# Patient Record
Sex: Female | Born: 1950
Health system: Southern US, Community
[De-identification: ages and names within clinical notes are randomized; demographics above are authoritative.]

## PROBLEM LIST (undated history)

## (undated) DIAGNOSIS — R0902 Hypoxemia: Secondary | ICD-10-CM

## (undated) DIAGNOSIS — K5900 Constipation, unspecified: Secondary | ICD-10-CM

## (undated) DIAGNOSIS — Z8709 Personal history of other diseases of the respiratory system: Secondary | ICD-10-CM

## (undated) DIAGNOSIS — K5790 Diverticulosis of intestine, part unspecified, without perforation or abscess without bleeding: Secondary | ICD-10-CM

## (undated) DIAGNOSIS — G43909 Migraine, unspecified, not intractable, without status migrainosus: Secondary | ICD-10-CM

## (undated) DIAGNOSIS — J309 Allergic rhinitis, unspecified: Secondary | ICD-10-CM

## (undated) DIAGNOSIS — E039 Hypothyroidism, unspecified: Secondary | ICD-10-CM

## (undated) DIAGNOSIS — G4700 Insomnia, unspecified: Secondary | ICD-10-CM

## (undated) DIAGNOSIS — R350 Frequency of micturition: Secondary | ICD-10-CM

## (undated) DIAGNOSIS — I10 Essential (primary) hypertension: Secondary | ICD-10-CM

## (undated) DIAGNOSIS — Z9981 Dependence on supplemental oxygen: Secondary | ICD-10-CM

## (undated) DIAGNOSIS — M2540 Effusion, unspecified joint: Secondary | ICD-10-CM

## (undated) DIAGNOSIS — Z9889 Other specified postprocedural states: Secondary | ICD-10-CM

## (undated) DIAGNOSIS — M1990 Unspecified osteoarthritis, unspecified site: Secondary | ICD-10-CM

## (undated) DIAGNOSIS — J189 Pneumonia, unspecified organism: Secondary | ICD-10-CM

## (undated) DIAGNOSIS — T4145XA Adverse effect of unspecified anesthetic, initial encounter: Secondary | ICD-10-CM

## (undated) DIAGNOSIS — B019 Varicella without complication: Secondary | ICD-10-CM

## (undated) DIAGNOSIS — K219 Gastro-esophageal reflux disease without esophagitis: Secondary | ICD-10-CM

## (undated) DIAGNOSIS — K649 Unspecified hemorrhoids: Secondary | ICD-10-CM

## (undated) DIAGNOSIS — Z860100 Personal history of colon polyps, unspecified: Secondary | ICD-10-CM

## (undated) DIAGNOSIS — J40 Bronchitis, not specified as acute or chronic: Secondary | ICD-10-CM

## (undated) DIAGNOSIS — Z8719 Personal history of other diseases of the digestive system: Secondary | ICD-10-CM

## (undated) DIAGNOSIS — T8859XA Other complications of anesthesia, initial encounter: Secondary | ICD-10-CM

## (undated) DIAGNOSIS — E876 Hypokalemia: Secondary | ICD-10-CM

## (undated) DIAGNOSIS — F419 Anxiety disorder, unspecified: Secondary | ICD-10-CM

## (undated) DIAGNOSIS — R0602 Shortness of breath: Secondary | ICD-10-CM

## (undated) DIAGNOSIS — F32A Depression, unspecified: Secondary | ICD-10-CM

## (undated) DIAGNOSIS — E785 Hyperlipidemia, unspecified: Secondary | ICD-10-CM

## (undated) DIAGNOSIS — E119 Type 2 diabetes mellitus without complications: Secondary | ICD-10-CM

## (undated) DIAGNOSIS — Z87442 Personal history of urinary calculi: Secondary | ICD-10-CM

## (undated) DIAGNOSIS — R112 Nausea with vomiting, unspecified: Secondary | ICD-10-CM

## (undated) DIAGNOSIS — R1310 Dysphagia, unspecified: Secondary | ICD-10-CM

## (undated) DIAGNOSIS — F329 Major depressive disorder, single episode, unspecified: Secondary | ICD-10-CM

## (undated) HISTORY — DX: Depression, unspecified: F32.A

## (undated) HISTORY — PX: ESOPHAGOGASTRODUODENOSCOPY: SHX1529

## (undated) HISTORY — DX: Other complications of anesthesia, initial encounter: T88.59XA

## (undated) HISTORY — DX: Type 2 diabetes mellitus without complications: E11.9

## (undated) HISTORY — DX: Allergic rhinitis, unspecified: J30.9

## (undated) HISTORY — DX: Major depressive disorder, single episode, unspecified: F32.9

## (undated) HISTORY — PX: ABDOMINAL EXPLORATION SURGERY: SHX538

## (undated) HISTORY — DX: Hypothyroidism, unspecified: E03.9

## (undated) HISTORY — PX: CHOLECYSTECTOMY: SHX55

## (undated) HISTORY — DX: Anxiety disorder, unspecified: F41.9

## (undated) HISTORY — DX: Unspecified osteoarthritis, unspecified site: M19.90

## (undated) HISTORY — PX: OTHER SURGICAL HISTORY: SHX169

## (undated) HISTORY — PX: LITHOTRIPSY: SUR834

## (undated) HISTORY — DX: Hypoxemia: R09.02

## (undated) HISTORY — DX: Dependence on supplemental oxygen: Z99.81

## (undated) HISTORY — DX: Adverse effect of unspecified anesthetic, initial encounter: T41.45XA

## (undated) HISTORY — DX: Gastro-esophageal reflux disease without esophagitis: K21.9

## (undated) HISTORY — DX: Hypokalemia: E87.6

## (undated) HISTORY — DX: Migraine, unspecified, not intractable, without status migrainosus: G43.909

## (undated) HISTORY — DX: Hyperlipidemia, unspecified: E78.5

## (undated) HISTORY — DX: Varicella without complication: B01.9

## (undated) HISTORY — PX: APPENDECTOMY: SHX54

## (undated) HISTORY — DX: Bronchitis, not specified as acute or chronic: J40

## (undated) HISTORY — PX: TUBAL LIGATION: SHX77

---

## 2004-09-03 ENCOUNTER — Ambulatory Visit: Payer: Self-pay | Admitting: General Practice

## 2005-09-15 ENCOUNTER — Other Ambulatory Visit: Payer: Self-pay

## 2005-09-16 ENCOUNTER — Ambulatory Visit: Payer: Self-pay | Admitting: Unknown Physician Specialty

## 2006-04-14 ENCOUNTER — Ambulatory Visit: Payer: Self-pay | Admitting: Family Medicine

## 2008-04-04 ENCOUNTER — Ambulatory Visit: Payer: Self-pay | Admitting: Family Medicine

## 2010-02-19 ENCOUNTER — Ambulatory Visit: Payer: Self-pay

## 2011-02-26 ENCOUNTER — Encounter: Payer: Self-pay | Admitting: Cardiothoracic Surgery

## 2011-02-26 ENCOUNTER — Encounter: Payer: Self-pay | Admitting: Nurse Practitioner

## 2011-07-29 ENCOUNTER — Ambulatory Visit: Payer: Self-pay | Admitting: Family Medicine

## 2012-08-21 ENCOUNTER — Ambulatory Visit: Payer: Self-pay

## 2012-10-18 ENCOUNTER — Encounter: Payer: Self-pay | Admitting: Pulmonary Disease

## 2012-10-19 ENCOUNTER — Ambulatory Visit (INDEPENDENT_AMBULATORY_CARE_PROVIDER_SITE_OTHER): Payer: BC Managed Care – HMO | Admitting: Pulmonary Disease

## 2012-10-19 ENCOUNTER — Encounter: Payer: Self-pay | Admitting: Pulmonary Disease

## 2012-10-19 VITALS — BP 142/80 | HR 74 | Temp 97.9°F | Ht 67.0 in | Wt 218.0 lb

## 2012-10-19 DIAGNOSIS — J84112 Idiopathic pulmonary fibrosis: Secondary | ICD-10-CM | POA: Insufficient documentation

## 2012-10-19 DIAGNOSIS — R0602 Shortness of breath: Secondary | ICD-10-CM

## 2012-10-19 DIAGNOSIS — R05 Cough: Secondary | ICD-10-CM

## 2012-10-19 NOTE — Patient Instructions (Signed)
Keep using the Advair, singulair, claritin, and flonase as you are doing Use [facility] rinses with distilled water at least twice per day using the instructions on the package. 1/2 hour after using the [facility] rinse, use Flonase two puffs in each nostril once per day. (It is OK for you to decrease the Flonase to one puff in each nostril every day if your nose is sore.) Use phenylephrine (a decongestant, ask the pharmacist if you need help finding it) as needed for the sinus congestion and cough.   We will send you for lung function testing at [facility].  We will see you back in 2-3 weeks.

## 2012-10-19 NOTE — Progress Notes (Signed)
Subjective:    Patient ID: Kaitlin Hamilton, female    DOB: 04-Oct-1950, 62 y.o.   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 Showed normal childhood without respiratory illnesses and never smoked cigarettes. Since 2006 she has had several episodes of bronchitis which of them quite lengthy. These typically last anywhere from 3 weeks to several months and are associated with cough, wheeze, chest tightness, and shortness of breath. Most recently, in January 2014 she developed a cough with sinus congestion and some phlegm production. She was seen by her primary care doctor on 2 separate occasions who treated her with antibiotics and ordered a chest x-ray. The chest x-ray was read as normal. Her primary care physician thought she might have asthma so she started her on Advair as well as Singulair and Claritin and Flonase. In the last 2-3 weeks her symptoms have improved significantly but she still has some residual cough which is nonproductive. This is associated with sinus congestion and a postnasal drip. She typically produces a significant amount of nasal mucus in the mornings. She uses saline rinses on a regular basis but has cut back the dose of her Flonase recently because of soreness in her nose. She does not have fevers chills or chest pain. She states in the past she had some chest pain and had a cardiac workup which was negative. She was told that her chest pain was due to anxiety.   Past Medical History  Diagnosis Date  . Asthma   . Bronchitis   . Chicken pox   . Depression   . Migraines   . OA (osteoarthritis)   . Hypothyroidism (acquired)   . Allergic rhinitis   . Non-insulin dependent type 2 diabetes mellitus   . Hyperlipidemia   . Hypokalemia      Family History  Problem Relation Age of Onset  . Rheum arthritis Mother   . Rheum arthritis Sister   . Prostate cancer  Brother   . Heart disease Maternal Grandmother   . Heart disease Maternal Grandfather   . Uterine cancer Daughter      History   Social History  . Marital Status: Unknown    Spouse Name: N/A    Number of Children: 2  . Years of Education: N/A   Occupational History  . Day Care at [employer]    Social History Main Topics  . Smoking status: Never Smoker   . Smokeless tobacco: Never Used  . Alcohol Use: No  . Drug Use: No  . Sexually Active: Not on file   Other Topics Concern  . Not on file   Social History Narrative  . No narrative on file     Allergies  Allergen Reactions  . Codeine Hives and Swelling  . Demerol (Meperidine) Hives and Swelling     No outpatient prescriptions prior to visit.   No facility-administered medications prior to visit.       Review of Systems  Constitutional: Negative for fever, chills and unexpected weight change.  HENT: Positive for postnasal drip. Negative for ear pain, nosebleeds, congestion, sore throat, rhinorrhea, sneezing, trouble swallowing, dental problem, voice change and sinus pressure.   Eyes: Negative for visual disturbance.  Respiratory: Positive for cough and shortness of breath. Negative for choking.   Cardiovascular: Negative for chest pain and leg swelling.  Gastrointestinal: Negative for vomiting, abdominal pain and diarrhea.  Genitourinary: Negative for difficulty urinating.  Musculoskeletal: Negative for arthralgias.  Skin: Negative for rash.  Neurological: Negative for tremors, syncope and headaches.  Hematological: Does not bruise/bleed easily.       Objective:   Physical Exam  Filed Vitals:   10/19/12 0920  BP: 142/80  Pulse: 74  Temp: 97.9 F (36.6 C)  TempSrc: Oral  Height: 5\' 7"  (1.702 m)  Weight: 218 lb (98.884 kg)  SpO2: 97%   Gen: obese, well appearing, no acute distress HEENT: NCAT, PERRL, EOMi, OP clear, neck supple without masses PULM: Few insp crackles in bases bilaterally CV: RRR, no  mgr, no JVD AB: BS+, soft, nontender, no hsm Ext: warm, no edema, no clubbing, no cyanosis Derm: no rash or skin breakdown Neuro: A&Ox4, CN II-XII intact, strength 5/5 in all 4 extremities  3 2013 chest x-ray [facility] normal     Assessment & Plan:   Shortness of breath I explained to Mrs. Cravey today that even though her simple spirometry was normal I believe that adult onset asthma it is possible. She describes prolonged episodes of bronchitis on multiple occasions in the last several years. Recently her symptoms have improved with the addition of an inhaled corticosteroid as well as intermittent albuterol. Given the fact that she has what sounds like fairly significant allergies (allergic rhinitis in the spring) is certainly possible that she has asthma. She tells me that she has had a cardiac evaluation in the last several years which was normal and on physical exam today she does not appear to be volume overloaded. I am encouraged by the fact that her chest x-ray is normal.  So, given her response to Advair, recent prednisone, and albuterol it is reasonable to assume that her dyspnea is do to asthma. To help sort this out I will send her for full pulmonary function testing as well as pre-and post bronchodilator testing. If this shows a significant response to a bronchodilator and I think we can label this asthma.  Plan: -Full pulmonary function testing with pre-and post bronchodilator test -Continue Advair, Singulair, Claritin, and albuterol -Will attempt to decrease inhaled controller medications over the course of the next few months   Updated Medication List Outpatient Encounter Prescriptions as of 10/19/2012  Medication Sig Dispense Refill  . albuterol (VENTOLIN HFA) 108 (90 BASE) MCG/ACT inhaler Inhale 2 puffs into the lungs every 6 (six) hours as needed.      . fluticasone (FLONASE) 50 MCG/ACT nasal spray Place 1 spray into the nose 2 (two) times daily.      . Fluticasone-Salmeterol  (ADVAIR DISKUS) 250-50 MCG/DOSE AEPB Inhale 1 puff into the lungs every 12 (twelve) hours.      Marland Kitchen glipiZIDE (GLUCOTROL) 5 MG tablet Take 5 mg by mouth daily.      . hydrochlorothiazide (MICROZIDE) 12.5 MG capsule Take 12.5 mg by mouth daily.      Marland Kitchen levothyroxine (SYNTHROID, LEVOTHROID) 150 MCG tablet Take 150 mcg by mouth daily.      . montelukast (SINGULAIR) 10 MG tablet Take 10 mg by mouth at bedtime.      . ranitidine (ZANTAC) 75 MG tablet Take 75 mg by mouth daily.      . verapamil (VERELAN PM) 180 MG 24 hr capsule Take 180 mg by mouth daily.       No facility-administered encounter medications on file as of 10/19/2012.

## 2012-10-19 NOTE — Assessment & Plan Note (Signed)
I explained to Mrs. Kingbird today that even though her simple spirometry was normal I believe that adult onset asthma it is possible. She describes prolonged episodes of bronchitis on multiple occasions in the last several years. Recently her symptoms have improved with the addition of an inhaled corticosteroid as well as intermittent albuterol. Given the fact that she has what sounds like fairly significant allergies (allergic rhinitis in the spring) is certainly possible that she has asthma. She tells me that she has had a cardiac evaluation in the last several years which was normal and on physical exam today she does not appear to be volume overloaded. I am encouraged by the fact that her chest x-ray is normal.  So, given her response to Advair, recent prednisone, and albuterol it is reasonable to assume that her dyspnea is do to asthma. To help sort this out I will send her for full pulmonary function testing as well as pre-and post bronchodilator testing. If this shows a significant response to a bronchodilator and I think we can label this asthma.  Plan: -Full pulmonary function testing with pre-and post bronchodilator test -Continue Advair, Singulair, Claritin, and albuterol -Will attempt to decrease inhaled controller medications over the course of the next few months

## 2012-10-27 ENCOUNTER — Telehealth: Payer: Self-pay | Admitting: Pulmonary Disease

## 2012-10-27 NOTE — Telephone Encounter (Signed)
I spoke with Sharone. She stated we give pt code and they call there insurance to see if it is covered. We have 3 codes. 1) [id number] 2) [id number] 3) [id number]  lmtcb x1 for pt to give her the codes so she can call her insurance to see if they cover this.

## 2012-10-27 NOTE — Telephone Encounter (Signed)
Pt returned call and can be reached @ [phone].  She would like to be called back before 11:30 if possible. Kaitlin Hamilton

## 2012-10-27 NOTE — Telephone Encounter (Signed)
lmomtcb x1 

## 2012-10-27 NOTE — Telephone Encounter (Signed)
lmtcb x1 

## 2012-10-27 NOTE — Telephone Encounter (Signed)
Patient returning call.

## 2012-10-27 NOTE — Telephone Encounter (Signed)
I spoke with pt and gave her the 3 codes. She will call her insurance. Nothing further was needed

## 2012-10-28 NOTE — Telephone Encounter (Signed)
ATC pt x1 > line rang multiple times with no answer, then changed to busy signal.  WCB.

## 2012-10-29 NOTE — Telephone Encounter (Signed)
I spoke with the pt and she states she cannot afford to have PFT done at the hospital. This appt has been cancelled and the pt is ok to come to [place] office to have pft done because this will be cheaper. Appt set for 11-15-12. Carron Curie, CMA

## 2012-11-15 ENCOUNTER — Ambulatory Visit (INDEPENDENT_AMBULATORY_CARE_PROVIDER_SITE_OTHER): Payer: BC Managed Care – HMO | Admitting: Pulmonary Disease

## 2012-11-15 DIAGNOSIS — R0602 Shortness of breath: Secondary | ICD-10-CM

## 2012-11-15 LAB — PULMONARY FUNCTION TEST

## 2012-11-15 NOTE — Progress Notes (Signed)
PFT done today. 

## 2012-11-16 ENCOUNTER — Encounter: Payer: Self-pay | Admitting: Pulmonary Disease

## 2012-11-16 ENCOUNTER — Ambulatory Visit (INDEPENDENT_AMBULATORY_CARE_PROVIDER_SITE_OTHER): Payer: BC Managed Care – HMO | Admitting: Pulmonary Disease

## 2012-11-16 ENCOUNTER — Ambulatory Visit: Payer: BC Managed Care – HMO | Admitting: Pulmonary Disease

## 2012-11-16 VITALS — BP 124/80 | HR 78 | Temp 97.6°F | Ht 67.0 in | Wt 216.0 lb

## 2012-11-16 DIAGNOSIS — R0602 Shortness of breath: Secondary | ICD-10-CM

## 2012-11-16 NOTE — Assessment & Plan Note (Addendum)
I am pleased that her symptoms have improved.    Kaitlin Hamilton's PFT's were not consistent with asthma (no obstruction, no change in her bronchodilators).  However, they showed restriction and a depressed DLCO.  Given her crackles on lung exam, I question pulmonary fibrosis vs recurrent aspiration (she has significant reflux).  Plan: -CT chest without contrast to evaluate for fibrosis -check CBC for H/H -stop Advair -She can continue to use albuterol as needed. -if no abnormalities, will proceed with watchful waiting as her symptoms have significantly improved -if symptoms return or worsen and no clear cause of dyspnea on CT chest and CBC, then would consider repeat cardiac work up

## 2012-11-16 NOTE — Patient Instructions (Signed)
We will call you with the results of the CT scan and the blood work from today  If you get a head cold, we recommend the following: [facility] Med rinses twice a day with distilled water/salt packet (or homemade remedy, see attached) -chlor-trimeton q4 hours as needed -pseudophed or phenylephrine as needed for nasal congestion  We will see you back in 6 months or sooner if you develop shortness of breath, cough

## 2012-11-16 NOTE — Progress Notes (Signed)
Subjective:    Patient ID: Kaitlin Hamilton, female    DOB: [birth year], 62 y.o.   MRN: [id number]  Synopsis: Kaitlin Hamilton is a very pleasant 62 year old female who first saw the [facility] pulmonary clinic in March 2014 for evaluation of shortness of breath. She had significant cough, wheezing, and sputum production requiring multiple rounds of antibiotics. Because of crackles on lung exam and an abnormal pulmonary function test she was referred to [place]. We ordered full pulmonary function testing which showed moderate restriction and a depressed DLCO in proportion to her restriction. There is no airflow obstruction and no change with bronchodilator administration. A chest x-ray performed in February 2014 was read as normal.  HPI  11/16/2012 ROV -- Kaitlin Hamilton feels that her cough and dyspnea is better since our last visit.  She is still taking Advair twice a day and rarely has to use her albuterol inhaler. She did use albuterol over the weekend when she was cleaning out a closet that was full of a lot of dust and mold. This made her short of breath and had chest congestion. The albuterol helped significantly. She stated that when she use the albuterol for the pulmonary function test yesterday she felt significant improvement in her breathing. Otherwise she is doing very well and has no symptoms.   Past Medical History  Diagnosis Date  . Asthma   . Bronchitis   . Chicken pox   . Depression   . Migraines   . OA (osteoarthritis)   . Hypothyroidism (acquired)   . Allergic rhinitis   . Non-insulin dependent type 2 diabetes mellitus   . Hyperlipidemia   . Hypokalemia      Family History  Problem Relation Age of Onset  . Rheum arthritis Mother   . Rheum arthritis Sister   . Prostate cancer Brother   . Heart disease Maternal Grandmother   . Heart disease Maternal Grandfather   . Uterine cancer Daughter      History   Social History  . Marital Status: Unknown    Spouse Name: N/A    Number of  Children: 2  . Years of Education: N/A   Occupational History  . Day Care at [employer]    Social History Main Topics  . Smoking status: Never Smoker   . Smokeless tobacco: Never Used  . Alcohol Use: No  . Drug Use: No  . Sexually Active: Not on file   Other Topics Concern  . Not on file   Social History Narrative  . No narrative on file     Allergies  Allergen Reactions  . Codeine Hives and Swelling  . Demerol (Meperidine) Hives and Swelling     Outpatient Prescriptions Prior to Visit  Medication Sig Dispense Refill  . albuterol (VENTOLIN HFA) 108 (90 BASE) MCG/ACT inhaler Inhale 2 puffs into the lungs every 6 (six) hours as needed.      . fluticasone (FLONASE) 50 MCG/ACT nasal spray Place 1 spray into the nose 2 (two) times daily.      . Fluticasone-Salmeterol (ADVAIR DISKUS) 250-50 MCG/DOSE AEPB Inhale 1 puff into the lungs every 12 (twelve) hours.      Marland Kitchen glipiZIDE (GLUCOTROL) 5 MG tablet Take 5 mg by mouth daily.      . hydrochlorothiazide (MICROZIDE) 12.5 MG capsule Take 12.5 mg by mouth daily.      Marland Kitchen levothyroxine (SYNTHROID, LEVOTHROID) 150 MCG tablet Take 150 mcg by mouth daily.      . montelukast (SINGULAIR) 10 MG tablet  Take 10 mg by mouth at bedtime.      . verapamil (VERELAN PM) 180 MG 24 hr capsule Take 180 mg by mouth daily.      . ranitidine (ZANTAC) 75 MG tablet Take 75 mg by mouth daily.       No facility-administered medications prior to visit.     Review of Systems  Constitutional: Negative for fever, chills and fatigue.  HENT: Negative for congestion and rhinorrhea.   Respiratory: Negative for cough, shortness of breath and wheezing.   Cardiovascular: Negative for chest pain, palpitations and leg swelling.       Objective:   Physical Exam  Filed Vitals:   11/16/12 0946  BP: 124/80  Pulse: 78  Temp: 97.6 F (36.4 C)  TempSrc: Oral  Height: 5\' 7"  (1.702 m)  Weight: 216 lb (97.977 kg)  SpO2: 96%  Room Air  Gen: overweight, well appearing, no  acute distress HEENT: NCAT, PERRL, EOMi, OP clear, neck supple without masses PULM: Inspiratory fine crackles in bases CV: RRR, no mgr, no JVD AB: BS+, soft, nontender, no hsm Ext: warm, trace leg edema, no clubbing, no cyanosis  February 2014 simple spirometry performed by her primary care physician>> ratio 90%, FEV1 1.71 L (64% predicted, FVC 1.83 L 55% predicted; flow volume loop is not consistent with obstruction February 2014 chest x-[facility] at [facility] normal 10/19/2012 walked 500 feet in office on room air oxygenation did not drop below 90% 11/15/2012 Full PFT [name]> Ratio 87%, FEV1 2.00L > 2.07 with bronchodilator (3% change); TLC 3.44 L (65% pred), ERV 0.62 (57% pred), DLCO 15.1 ml/mmHg/min (59% pred)      Assessment & Plan:   Shortness of breath I am pleased that her symptoms have improved.    Kaitlin Hamilton's PFT's were not consistent with asthma (no obstruction, no change in her bronchodilators).  However, they showed restriction and a depressed DLCO.  Given her crackles on lung exam, I question pulmonary fibrosis vs recurrent aspiration (she has significant reflux).  Plan: -CT chest without contrast to evaluate for fibrosis -check CBC for H/H -stop Advair -She can continue to use albuterol as needed. -if no abnormalities, will proceed with watchful waiting as her symptoms have significantly improved -if symptoms return or worsen and no clear cause of dyspnea on CT chest and CBC, then would consider repeat cardiac work up    Updated Medication List Outpatient Encounter Prescriptions as of 11/16/2012  Medication Sig Dispense Refill  . albuterol (VENTOLIN HFA) 108 (90 BASE) MCG/ACT inhaler Inhale 2 puffs into the lungs every 6 (six) hours as needed.      Marland Kitchen dexlansoprazole (DEXILANT) 60 MG capsule Take 60 mg by mouth daily.      . fluticasone (FLONASE) 50 MCG/ACT nasal spray Place 1 spray into the nose 2 (two) times daily.      . Fluticasone-Salmeterol (ADVAIR DISKUS) 250-50 MCG/DOSE  AEPB Inhale 1 puff into the lungs every 12 (twelve) hours.      Marland Kitchen glipiZIDE (GLUCOTROL) 5 MG tablet Take 5 mg by mouth daily.      . hydrochlorothiazide (MICROZIDE) 12.5 MG capsule Take 12.5 mg by mouth daily.      . Hypertonic Nasal Wash (SINUS RINSE BOTTLE KIT NA) As directed as needed      . levothyroxine (SYNTHROID, LEVOTHROID) 150 MCG tablet Take 150 mcg by mouth daily.      . montelukast (SINGULAIR) 10 MG tablet Take 10 mg by mouth at bedtime.      Marland Kitchen  verapamil (VERELAN PM) 180 MG 24 hr capsule Take 180 mg by mouth daily.      . [DISCONTINUED] ranitidine (ZANTAC) 75 MG tablet Take 75 mg by mouth daily.       No facility-administered encounter medications on file as of 11/16/2012.

## 2012-11-17 LAB — CBC WITH DIFFERENTIAL/PLATELET
Basophils Relative: 1 % (ref 0–1)
Hemoglobin: 14.1 g/dL (ref 12.0–15.0)
Lymphs Abs: 3 10*3/uL (ref 0.7–4.0)
Monocytes Relative: 7 % (ref 3–12)
Neutro Abs: 4.2 10*3/uL (ref 1.7–7.7)
Neutrophils Relative %: 53 % (ref 43–77)
RBC: 4.72 MIL/uL (ref 3.87–5.11)

## 2012-11-18 NOTE — Progress Notes (Signed)
Quick Note:  Pt aware of results per Dr Kendrick Fries ______

## 2012-11-18 NOTE — Progress Notes (Signed)
Quick Note:  Spoke with pt and notified of results per Dr. Wert. Pt verbalized understanding and denied any questions.  ______ 

## 2012-11-19 ENCOUNTER — Other Ambulatory Visit: Payer: BC Managed Care – HMO

## 2012-11-25 ENCOUNTER — Ambulatory Visit (INDEPENDENT_AMBULATORY_CARE_PROVIDER_SITE_OTHER)
Admission: RE | Admit: 2012-11-25 | Discharge: 2012-11-25 | Disposition: A | Discharge status: Home / Self Care | Payer: BC Managed Care – HMO | Source: Ambulatory Visit | Attending: Pulmonary Disease | Admitting: Pulmonary Disease

## 2012-11-25 DIAGNOSIS — R0602 Shortness of breath: Secondary | ICD-10-CM

## 2012-11-26 ENCOUNTER — Encounter: Payer: Self-pay | Admitting: Pulmonary Disease

## 2012-11-30 ENCOUNTER — Telehealth: Payer: Self-pay | Admitting: Pulmonary Disease

## 2012-11-30 ENCOUNTER — Other Ambulatory Visit: Payer: Self-pay | Admitting: Pulmonary Disease

## 2012-11-30 ENCOUNTER — Telehealth: Payer: Self-pay | Admitting: *Deleted

## 2012-11-30 DIAGNOSIS — J849 Interstitial pulmonary disease, unspecified: Secondary | ICD-10-CM

## 2012-11-30 DIAGNOSIS — R0602 Shortness of breath: Secondary | ICD-10-CM

## 2012-11-30 DIAGNOSIS — R9389 Abnormal findings on diagnostic imaging of other specified body structures: Secondary | ICD-10-CM

## 2012-11-30 DIAGNOSIS — J8410 Pulmonary fibrosis, unspecified: Secondary | ICD-10-CM

## 2012-11-30 NOTE — Telephone Encounter (Signed)
Pt has spoken to BQ about her CT scan.  Per BQ - pt is come in sooner than her 6 month ROV and is to have labs before hand.  Pt is aware of this information. She has been scheduled for 02/08/13 @ 9am. Labs have been ordered.

## 2012-11-30 NOTE — Telephone Encounter (Signed)
Message copied by Christen Butter on Tue Nov 30, 2012 11:25 AM ------      Message from: Lupita Leash      Created: Tue Nov 30, 2012  9:15 AM       Hi,            I called Ms. Kelter to discuss the CT.            Can we move up her appointment to a 3 month follow up rather than 6 month?            She needs to have these labs before the next visit (preferrably 2-3 weeks before the visit so I can see the results):      -ANA      -DS-DNA      -Anti-Jo-1      -Aldolase      -Anti-centromere      -Anti-SCL-70      -Anti-Ro      -Anti-La      -Rheumatoid factor      -Anti-CCP      -Hypersensitivity pneumonitis panel            She also needs to have a DG esophagus to look for aspiration.            Thanks      Kipp Brood ------

## 2012-11-30 NOTE — Telephone Encounter (Signed)
I called Ms. Dax to discuss the results of her CT scan which should fibrosis in the R > L.  She reports that she is still feeling well.  I explained to her that I want her to see me in three months rather that 6.  We will order a lab panel and an DG esophagus to look for aspiration as part of a work up prior to the next visit.    Yolonda Kida PCCM Pager: [phone] Cell: [phone] If no response, call [phone]

## 2012-11-30 NOTE — Telephone Encounter (Signed)
I have ordered the labs Pt is aware She has ov set with BQ for 02/07/13 Will have labs done at least 2 wks prior to this She states that she does not wish to have DG esophagus at this time, prefers to discuss this with her PCP and then with Dr Kendrick Fries before this is done Will forward to him as [place]

## 2012-12-03 ENCOUNTER — Encounter: Payer: Self-pay | Admitting: Pulmonary Disease

## 2012-12-07 ENCOUNTER — Ambulatory Visit: Payer: BC Managed Care – HMO | Admitting: Pulmonary Disease

## 2013-01-31 ENCOUNTER — Inpatient Hospital Stay (HOSPITAL_COMMUNITY): Admission: RE | Admit: 2013-01-31 | Payer: BC Managed Care – HMO | Source: Ambulatory Visit

## 2013-02-08 ENCOUNTER — Ambulatory Visit: Payer: BC Managed Care – HMO | Admitting: Pulmonary Disease

## 2013-02-15 ENCOUNTER — Ambulatory Visit: Payer: BC Managed Care – HMO | Admitting: Pulmonary Disease

## 2013-02-16 ENCOUNTER — Telehealth: Payer: Self-pay | Admitting: Pulmonary Disease

## 2013-02-16 NOTE — Telephone Encounter (Addendum)
LM on voicemail per her request. Advised that BQ didn't want to see her back until 04/2013, she can call back when it gets closer to let us know how she is doing to determine if she needs to be seen.

## 2013-02-16 NOTE — Telephone Encounter (Signed)
Patient returning call.  Wanting [place] to leave message on VM, due to her not being able to take calls while at work.

## 2013-02-16 NOTE — Telephone Encounter (Signed)
lmtcb x1 for pt. 

## 2013-03-22 ENCOUNTER — Ambulatory Visit: Payer: Self-pay | Admitting: Family Medicine

## 2013-03-24 ENCOUNTER — Telehealth: Payer: Self-pay | Admitting: Pulmonary Disease

## 2013-03-24 NOTE — Telephone Encounter (Signed)
I have faxed all this information over. I called # listed and was transferred to sara VM. I left this advising her of so.

## 2013-04-19 ENCOUNTER — Ambulatory Visit (INDEPENDENT_AMBULATORY_CARE_PROVIDER_SITE_OTHER): Payer: BC Managed Care – HMO | Admitting: Pulmonary Disease

## 2013-04-19 ENCOUNTER — Encounter: Payer: Self-pay | Admitting: Pulmonary Disease

## 2013-04-19 VITALS — BP 140/80 | HR 90 | Ht 67.0 in | Wt 234.0 lb

## 2013-04-19 DIAGNOSIS — J849 Interstitial pulmonary disease, unspecified: Secondary | ICD-10-CM

## 2013-04-19 DIAGNOSIS — J8410 Pulmonary fibrosis, unspecified: Secondary | ICD-10-CM

## 2013-04-19 DIAGNOSIS — R0602 Shortness of breath: Secondary | ICD-10-CM

## 2013-04-19 MED ORDER — FLUCONAZOLE 100 MG PO TABS: 100.0000 mg | ORAL_TABLET | Freq: Every day | ORAL | Status: DC

## 2013-04-19 MED ORDER — LEVOFLOXACIN 750 MG PO TABS: 750.0000 mg | ORAL_TABLET | Freq: Every day | ORAL | Status: AC

## 2013-04-19 MED ORDER — TRAMADOL HCL 50 MG PO TABS: 50.0000 mg | ORAL_TABLET | Freq: Four times a day (QID) | ORAL | Status: DC | PRN

## 2013-04-19 NOTE — Progress Notes (Deleted)
  Subjective:    Patient ID: Kaitlin Hamilton, female    DOB: 1951/03/22, 62 y.o.   MRN: [id number]  HPI    Review of Systems     Objective:   Physical Exam        Assessment & Plan:

## 2013-04-19 NOTE — Assessment & Plan Note (Signed)
I am concerned about cath the. Unfortunately, she did not followup with the blood work and the swallowing test which we ordered on the last visit.  I am concerned primarily that she has a fibrotic process in her lung. However, the CT scan she had in May of 2014 did not show a clear pattern with a disease such as UIP. It is often helpful to have 2 separate CT scans to see if there is a chronologic progression of the disease.   I am still concerned about the possibility of aspiration given the right lower lobe findings greater than left lung findings and her symptom of dysphasia. Because she continues to produce green sputum I am concerned she could have pneumonia today.  There has never been any evidence of obstructive lung disease and therefore there is really no role for inhaled therapies.  Plan: -Stop Dulera - Continue prednisone -Levaquin one week with yogurt -Blood work today for connective tissue disease is associated with lung disease as well as CBC with differential -Repeat CT scan -Repeat pulmonary function test -Barium swallow to look for esophageal pathology -Followup with me in 3-4 weeks, may need to consider biopsy at that point. -

## 2013-04-19 NOTE — Patient Instructions (Addendum)
Take the Levaquin for one week with yogurt; I have sent a prescription for diflucan if you need it for a yeast infection Keep taking the prednisone Stop taking the [facility]  Use the tramadol as needed for the cough, but don't take it with other cough or pain medicines; don't take it and drive  We will schedule lung function tests, a swallowing test, and another CT scan of your lungs in [place] in the next month  We will see you back in 3-4 weeks or sooner if needed

## 2013-04-20 ENCOUNTER — Telehealth: Payer: Self-pay | Admitting: Pulmonary Disease

## 2013-04-20 ENCOUNTER — Other Ambulatory Visit (INDEPENDENT_AMBULATORY_CARE_PROVIDER_SITE_OTHER): Payer: BC Managed Care – HMO

## 2013-04-20 DIAGNOSIS — J849 Interstitial pulmonary disease, unspecified: Secondary | ICD-10-CM

## 2013-04-20 DIAGNOSIS — J8410 Pulmonary fibrosis, unspecified: Secondary | ICD-10-CM

## 2013-04-20 LAB — CBC WITH DIFFERENTIAL/PLATELET
Basophils Absolute: 0 10*3/uL (ref 0.0–0.1)
Basophils Relative: 0 % (ref 0–1)
Eosinophils Absolute: 0.2 10*3/uL (ref 0.0–0.7)
Eosinophils Relative: 1 % (ref 0–5)
Lymphs Abs: 3.3 10*3/uL (ref 0.7–4.0)
MCH: 29.7 pg (ref 26.0–34.0)
MCHC: 34.2 g/dL (ref 30.0–36.0)
Monocytes Relative: 6 % (ref 3–12)
Neutro Abs: 6.5 10*3/uL (ref 1.7–7.7)
Neutrophils Relative %: 62 % (ref 43–77)
RBC: 4.48 MIL/uL (ref 3.87–5.11)
RDW: 14.4 % (ref 11.5–15.5)

## 2013-04-20 LAB — RHEUMATOID FACTOR: Rhuematoid fact SerPl-aCnc: <10 IU/mL (ref <=14)

## 2013-04-20 LAB — SEDIMENTATION RATE: Sed Rate: 21 mm/hr (ref 0–22)

## 2013-04-20 NOTE — Progress Notes (Signed)
Subjective:    Patient ID: Kaitlin Hamilton, female    DOB: 17-Jul-1951, 62 y.o.   MRN: [id number]  Synopsis: Kaitlin Hamilton is a very pleasant 63 year old female who first saw the [facility] pulmonary clinic in March 2014 for evaluation of shortness of breath. She had significant cough, wheezing, and sputum production requiring multiple rounds of antibiotics. Because of crackles on lung exam and an abnormal pulmonary function test she was referred to [place]. We ordered full pulmonary function testing which showed moderate restriction and a depressed DLCO in proportion to her restriction. There is no airflow obstruction and no change with bronchodilator administration. A chest x-ray performed in February 2014 was read as normal.  HPI   11/16/2012 ROV -- Kaitlin Hamilton feels that her cough and dyspnea is better since our last visit.  She is still taking Advair twice a day and rarely has to use her albuterol inhaler. She did use albuterol over the weekend when she was cleaning out a closet that was full of a lot of dust and mold. This made her short of breath and had chest congestion. The albuterol helped significantly. She stated that when she use the albuterol for the pulmonary function test yesterday she felt significant improvement in her breathing. Otherwise she is doing very well and has no symptoms.  04/19/2013 ROV -- Kaitlin Hamilton has had a rough time since her last visit. Unfortunately she never had a blood work done nor the barium swallow which we recommended last time. Approximately one month ago she developed a cough with some sputum production. She was treated with Augmentin as well as prednisone. She was also started on a Dulera inhaler at some point in the last month. She said that the prednisone deathly made her feel better but then her cough returned not long after she stopped that medicine. She has had some shortness of breath with this lately. The [facility] has not made any difference that she can tell. However she  does feel that the [facility] is making her blood sugar elevated.  She has been producing green sputum lately, particularly in the last three days.   Past Medical History  Diagnosis Date  . Asthma   . Bronchitis   . Chicken pox   . Depression   . Migraines   . OA (osteoarthritis)   . Hypothyroidism (acquired)   . Allergic rhinitis   . Non-insulin dependent type 2 diabetes mellitus   . Hyperlipidemia   . Hypokalemia      Family History  Problem Relation Age of Onset  . Rheum arthritis Mother   . Rheum arthritis Sister   . Prostate cancer Brother   . Heart disease Maternal Grandmother   . Heart disease Maternal Grandfather   . Uterine cancer Daughter      History   Social History  . Marital Status: Unknown    Spouse Name: N/A    Number of Children: 2  . Years of Education: N/A   Occupational History  . Day Care at [employer]    Social History Main Topics  . Smoking status: Never Smoker   . Smokeless tobacco: Never Used  . Alcohol Use: No  . Drug Use: No  . Sexual Activity: Not on file   Other Topics Concern  . Not on file   Social History Narrative  . No narrative on file     Allergies  Allergen Reactions  . Codeine Hives and Swelling  . Demerol [Meperidine] Hives and Swelling     Outpatient  Prescriptions Prior to Visit  Medication Sig Dispense Refill  . fluticasone (FLONASE) 50 MCG/ACT nasal spray Place 1 spray into the nose 2 (two) times daily.      Marland Kitchen glipiZIDE (GLUCOTROL) 5 MG tablet Take 5 mg by mouth daily.      . hydrochlorothiazide (MICROZIDE) 12.5 MG capsule Take 12.5 mg by mouth daily.      . Hypertonic Nasal Wash (SINUS RINSE BOTTLE KIT NA) As directed as needed      . levothyroxine (SYNTHROID, LEVOTHROID) 150 MCG tablet Take 150 mcg by mouth daily.      . montelukast (SINGULAIR) 10 MG tablet Take 10 mg by mouth at bedtime.      . verapamil (VERELAN PM) 180 MG 24 hr capsule Take 180 mg by mouth daily.      Marland Kitchen albuterol (VENTOLIN HFA) 108 (90 BASE)  MCG/ACT inhaler Inhale 2 puffs into the lungs every 6 (six) hours as needed.      Marland Kitchen dexlansoprazole (DEXILANT) 60 MG capsule Take 60 mg by mouth daily.      . Fluticasone-Salmeterol (ADVAIR DISKUS) 250-50 MCG/DOSE AEPB Inhale 1 puff into the lungs every 12 (twelve) hours.       No facility-administered medications prior to visit.     Review of Systems  Constitutional: Positive for fatigue. Negative for fever and chills.  HENT: Negative for congestion and rhinorrhea.   Respiratory: Positive for cough and shortness of breath. Negative for wheezing.   Cardiovascular: Negative for chest pain, palpitations and leg swelling.       Objective:   Physical Exam   Filed Vitals:   04/19/13 1638  BP: 140/80  Pulse: 90  Height: 5\' 7"  (1.702 m)  Weight: 234 lb (106.142 kg)  SpO2: 97%  Room Air  Gen: overweight, well appearing, no acute distress HEENT: NCAT, PERRL, EOMi, OP clear, neck supple without masses PULM: Inspiratory fine crackles from bases to 1/2 way up CV: RRR, no mgr, no JVD AB: BS+, soft, nontender, no hsm Ext: warm, trace leg edema, no clubbing, no cyanosis  February 2014 simple spirometry performed by her primary care physician>> ratio 90%, FEV1 1.71 L (64% predicted, FVC 1.83 L 55% predicted; flow volume loop is not consistent with obstruction February 2014 chest [facility] at [facility] normal 10/19/2012 walked 500 feet in office on room air oxygenation did not drop below 90% 11/15/2012 Full PFT [name]> Ratio 87%, FEV1 2.00L > 2.07 with bronchodilator (3% change); TLC 3.44 L (65% pred), ERV 0.62 (57% pred), DLCO 15.1 ml/mmHg/min (59% pred)      Assessment & Plan:   Shortness of breath I am concerned about cath the. Unfortunately, she did not followup with the blood work and the swallowing test which we ordered on the last visit.  I am concerned primarily that she has a fibrotic process in her lung. However, the CT scan she had in May of 2014 did not show a clear pattern with a  disease such as UIP. It is often helpful to have 2 separate CT scans to see if there is a chronologic progression of the disease.   I am still concerned about the possibility of aspiration given the right lower lobe findings greater than left lung findings and her symptom of dysphasia. Because she continues to produce green sputum I am concerned she could have pneumonia today.  There has never been any evidence of obstructive lung disease and therefore there is really no role for inhaled therapies.  Plan: -Stop Dulera -  Continue prednisone -Levaquin one week with yogurt -Blood work today for connective tissue disease is associated with lung disease as well as CBC with differential -Repeat CT scan -Repeat pulmonary function test -Barium swallow to look for esophageal pathology -Followup with me in 3-4 weeks, may need to consider biopsy at that point. -    Updated Medication List Outpatient Encounter Prescriptions as of 04/19/2013  Medication Sig Dispense Refill  . fluticasone (FLONASE) 50 MCG/ACT nasal spray Place 1 spray into the nose 2 (two) times daily.      Marland Kitchen glipiZIDE (GLUCOTROL) 5 MG tablet Take 5 mg by mouth daily.      . hydrochlorothiazide (MICROZIDE) 12.5 MG capsule Take 12.5 mg by mouth daily.      Marland Kitchen HYDROcodone-homatropine (HYCODAN) 5-1.5 MG/5ML syrup Take 5 mLs by mouth every 6 (six) hours as needed for cough.      . Hypertonic Nasal Wash (SINUS RINSE BOTTLE KIT NA) As directed as needed      . levothyroxine (SYNTHROID, LEVOTHROID) 150 MCG tablet Take 150 mcg by mouth daily.      . mometasone-formoterol (DULERA) 200-5 MCG/ACT AERO Inhale 2 puffs into the lungs 2 (two) times daily.      . montelukast (SINGULAIR) 10 MG tablet Take 10 mg by mouth at bedtime.      . predniSONE (DELTASONE) 10 MG tablet Taper - take as directed      . ranitidine (ZANTAC) 150 MG tablet Take 150 mg by mouth daily.      . verapamil (VERELAN PM) 180 MG 24 hr capsule Take 180 mg by mouth daily.       . fluconazole (DIFLUCAN) 100 MG tablet Take 1 tablet (100 mg total) by mouth daily.  3 tablet  0  . levofloxacin (LEVAQUIN) 750 MG tablet Take 1 tablet (750 mg total) by mouth daily.  7 tablet  0  . traMADol (ULTRAM) 50 MG tablet Take 1 tablet (50 mg total) by mouth every 6 (six) hours as needed (cough).  40 tablet  1  . [DISCONTINUED] albuterol (VENTOLIN HFA) 108 (90 BASE) MCG/ACT inhaler Inhale 2 puffs into the lungs every 6 (six) hours as needed.      . [DISCONTINUED] dexlansoprazole (DEXILANT) 60 MG capsule Take 60 mg by mouth daily.      . [DISCONTINUED] Fluticasone-Salmeterol (ADVAIR DISKUS) 250-50 MCG/DOSE AEPB Inhale 1 puff into the lungs every 12 (twelve) hours.       No facility-administered encounter medications on file as of 04/19/2013.

## 2013-04-20 NOTE — Telephone Encounter (Signed)
[email or web address]  05/11/13@4pm  Tobe Sos

## 2013-04-20 NOTE — Telephone Encounter (Signed)
I spoke with pt. She stated Dr. Kendrick Fries ordered some tests for her and she is requesting to have these done on a Monday, Wednesday, or Friday. She has a friend that has to bring her. Please advise PCC's thanks. Orders are in EPIC.

## 2013-04-21 ENCOUNTER — Encounter: Payer: Self-pay | Admitting: Pulmonary Disease

## 2013-04-21 LAB — ANTI-JO 1 ANTIBODY, IGG: Anti JO-1: <0.2 AI (ref 0.0–0.9)

## 2013-04-21 LAB — ANCA SCREEN W REFLEX TITER
c-ANCA Screen: NEGATIVE
p-ANCA Screen: NEGATIVE

## 2013-04-21 LAB — SJOGRENS SYNDROME-B EXTRACTABLE NUCLEAR ANTIBODY: SSB (La) (ENA) Antibody, IgG: <1 AU/mL (ref <30)

## 2013-04-21 LAB — SJOGRENS SYNDROME-A EXTRACTABLE NUCLEAR ANTIBODY: SSA (Ro) (ENA) Antibody, IgG: 7 AU/mL (ref <30)

## 2013-04-22 ENCOUNTER — Ambulatory Visit (INDEPENDENT_AMBULATORY_CARE_PROVIDER_SITE_OTHER)
Admission: RE | Admit: 2013-04-22 | Discharge: 2013-04-22 | Disposition: A | Discharge status: Home / Self Care | Payer: BC Managed Care – HMO | Source: Ambulatory Visit | Attending: Pulmonary Disease | Admitting: Pulmonary Disease

## 2013-04-22 DIAGNOSIS — J849 Interstitial pulmonary disease, unspecified: Secondary | ICD-10-CM

## 2013-04-22 DIAGNOSIS — J8410 Pulmonary fibrosis, unspecified: Secondary | ICD-10-CM

## 2013-04-22 LAB — ALDOLASE: Aldolase: 6.1 U/L (ref <=8.1)

## 2013-04-26 ENCOUNTER — Other Ambulatory Visit (HOSPITAL_COMMUNITY): Payer: BC Managed Care – HMO

## 2013-04-26 ENCOUNTER — Telehealth: Payer: Self-pay | Admitting: Pulmonary Disease

## 2013-04-26 NOTE — Telephone Encounter (Signed)
Notes Recorded by Lupita Leash, MD on 04/25/2013 at 1:09 PM L, Please let Ms. Dusenbery know that I saw the results of her bloodwork and CT scan and that the good news is things don't look worse since the last visit.  However I would like to see her soon to discuss what our next steps are. I want to try to treat her with medicine rather than a biopsy, but I want to talk to her about it after she has the barium swallow test.  Thanks, B ---  I spoke with patient about results and she verbalized understanding and had no questions

## 2013-04-27 ENCOUNTER — Ambulatory Visit (HOSPITAL_COMMUNITY)
Admission: RE | Admit: 2013-04-27 | Discharge: 2013-04-27 | Disposition: A | Discharge status: Home / Self Care | Payer: BC Managed Care – HMO | Source: Ambulatory Visit | Attending: Pulmonary Disease | Admitting: Pulmonary Disease

## 2013-04-27 ENCOUNTER — Encounter: Payer: Self-pay | Admitting: Pulmonary Disease

## 2013-04-27 DIAGNOSIS — R1310 Dysphagia, unspecified: Secondary | ICD-10-CM | POA: Insufficient documentation

## 2013-04-27 DIAGNOSIS — J849 Interstitial pulmonary disease, unspecified: Secondary | ICD-10-CM

## 2013-04-27 DIAGNOSIS — R05 Cough: Secondary | ICD-10-CM | POA: Insufficient documentation

## 2013-04-27 DIAGNOSIS — K449 Diaphragmatic hernia without obstruction or gangrene: Secondary | ICD-10-CM | POA: Insufficient documentation

## 2013-04-27 DIAGNOSIS — K219 Gastro-esophageal reflux disease without esophagitis: Secondary | ICD-10-CM | POA: Insufficient documentation

## 2013-04-27 DIAGNOSIS — R059 Cough, unspecified: Secondary | ICD-10-CM | POA: Insufficient documentation

## 2013-04-27 DIAGNOSIS — K224 Dyskinesia of esophagus: Secondary | ICD-10-CM | POA: Insufficient documentation

## 2013-04-28 ENCOUNTER — Other Ambulatory Visit: Payer: Self-pay | Admitting: Pulmonary Disease

## 2013-04-28 DIAGNOSIS — K224 Dyskinesia of esophagus: Secondary | ICD-10-CM

## 2013-04-29 ENCOUNTER — Encounter: Payer: Self-pay | Admitting: Internal Medicine

## 2013-05-11 ENCOUNTER — Ambulatory Visit (INDEPENDENT_AMBULATORY_CARE_PROVIDER_SITE_OTHER): Payer: BC Managed Care – PPO | Admitting: Pulmonary Disease

## 2013-05-11 DIAGNOSIS — R0602 Shortness of breath: Secondary | ICD-10-CM

## 2013-05-11 LAB — PULMONARY FUNCTION TEST

## 2013-05-11 NOTE — Progress Notes (Signed)
PFT done today. 

## 2013-05-16 ENCOUNTER — Encounter: Payer: Self-pay | Admitting: Pulmonary Disease

## 2013-05-17 ENCOUNTER — Ambulatory Visit (INDEPENDENT_AMBULATORY_CARE_PROVIDER_SITE_OTHER): Payer: BC Managed Care – PPO | Admitting: Pulmonary Disease

## 2013-05-17 ENCOUNTER — Encounter: Payer: Self-pay | Admitting: Pulmonary Disease

## 2013-05-17 VITALS — BP 132/80 | HR 84 | Temp 98.3°F | Ht 66.0 in | Wt 237.0 lb

## 2013-05-17 DIAGNOSIS — R0602 Shortness of breath: Secondary | ICD-10-CM

## 2013-05-17 DIAGNOSIS — Z23 Encounter for immunization: Secondary | ICD-10-CM

## 2013-05-17 DIAGNOSIS — H6691 Otitis media, unspecified, right ear: Secondary | ICD-10-CM | POA: Insufficient documentation

## 2013-05-17 DIAGNOSIS — H6690 Otitis media, unspecified, unspecified ear: Secondary | ICD-10-CM

## 2013-05-17 DIAGNOSIS — R9389 Abnormal findings on diagnostic imaging of other specified body structures: Secondary | ICD-10-CM

## 2013-05-17 DIAGNOSIS — K224 Dyskinesia of esophagus: Secondary | ICD-10-CM

## 2013-05-17 DIAGNOSIS — H6692 Otitis media, unspecified, left ear: Secondary | ICD-10-CM

## 2013-05-17 MED ORDER — SULFAMETHOXAZOLE-TMP DS 800-160 MG PO TABS: ORAL_TABLET | ORAL | Status: DC

## 2013-05-17 MED ORDER — PREDNISONE 10 MG PO TABS: ORAL_TABLET | ORAL | Status: DC

## 2013-05-17 NOTE — Progress Notes (Signed)
Subjective:    Patient ID: Kaitlin Hamilton, female    DOB: [birth year], 62 y.o.   MRN: [id number]  Synopsis: Kaitlin Hamilton is a very pleasant 62 year old female who first saw the [facility] pulmonary clinic in March 2014 for evaluation of shortness of breath. She had significant cough, wheezing, and sputum production requiring multiple rounds of antibiotics. Because of crackles on lung exam and an abnormal pulmonary function test she was referred to [place]. We ordered full pulmonary function testing which showed moderate restriction and a depressed DLCO in proportion to her restriction. There is no airflow obstruction and no change with bronchodilator administration. A chest x-ray performed in February 2014 was read as normal.  HPI   11/16/2012 ROV -- Kaitlin Hamilton feels that her cough and dyspnea is better since our last visit.  She is still taking Advair twice a day and rarely has to use her albuterol inhaler. She did use albuterol over the weekend when she was cleaning out a closet that was full of a lot of dust and mold. This made her short of breath and had chest congestion. The albuterol helped significantly. She stated that when she use the albuterol for the pulmonary function test yesterday she felt significant improvement in her breathing. Otherwise she is doing very well and has no symptoms.  04/19/2013 ROV -- Kaitlin Hamilton has had a rough time since her last visit. Unfortunately she never had a blood work done nor the barium swallow which we recommended last time. Approximately one month ago she developed a cough with some sputum production. She was treated with Augmentin as well as prednisone. She was also started on a Dulera inhaler at some point in the last month. She said that the prednisone deathly made her feel better but then her cough returned not long after she stopped that medicine. She has had some shortness of breath with this lately. The [facility] has not made any difference that she can tell. However she  does feel that the [facility] is making her blood sugar elevated.  She has been producing green sputum lately, particularly in the last three days.  05/17/2013 ROV > Kaitlin Hamilton feels like her breathing is better since the last visit.  She has been having ear pain for about 2-3 days in R ear. She has also noted some sinus symptoms since the last visit.   She continues to have a cough at night, but it is better overall and in the morning . When she bends over she has more cough.  She wonders if it is the carpet in her room. She typically works in the same room, but working in other rooms doesn't seem to make a difference in those.  Dyspnea has improved somewhat.    Past Medical History  Diagnosis Date  . Asthma   . Bronchitis   . Chicken pox   . Depression   . Migraines   . OA (osteoarthritis)   . Hypothyroidism (acquired)   . Allergic rhinitis   . Non-insulin dependent type 2 diabetes mellitus   . Hyperlipidemia   . Hypokalemia      Family History  Problem Relation Age of Onset  . Rheum arthritis Mother   . Rheum arthritis Sister   . Prostate cancer Brother   . Heart disease Maternal Grandmother   . Heart disease Maternal Grandfather   . Uterine cancer Daughter      History   Social History  . Marital Status: Unknown    Spouse Name: N/A  Number of Children: 2  . 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    Social History Main Topics  . Smoking status: Never Smoker   . Smokeless tobacco: Never Used  . Alcohol Use: No  . Drug Use: No  . Sexual Activity: Not on file   Other Topics Concern  . Not on file   Social History Narrative  . No narrative on file     Allergies  Allergen Reactions  . Codeine Hives and Swelling  . Demerol [Meperidine] Hives and Swelling     Outpatient Prescriptions Prior to Visit  Medication Sig Dispense Refill  . fluticasone (FLONASE) 50 MCG/ACT nasal spray Place 1 spray into the nose 2 (two) times daily.      Marland Kitchen  glipiZIDE (GLUCOTROL) 5 MG tablet Take 5 mg by mouth daily.      . hydrochlorothiazide (MICROZIDE) 12.5 MG capsule Take 12.5 mg by mouth daily.      Marland Kitchen HYDROcodone-homatropine (HYCODAN) 5-1.5 MG/5ML syrup Take 5 mLs by mouth every 6 (six) hours as needed for cough.      . Hypertonic Nasal Wash (SINUS RINSE BOTTLE KIT NA) As directed as needed      . levothyroxine (SYNTHROID, LEVOTHROID) 150 MCG tablet Take 150 mcg by mouth daily.      . montelukast (SINGULAIR) 10 MG tablet Take 10 mg by mouth at bedtime.      . traMADol (ULTRAM) 50 MG tablet Take 1 tablet (50 mg total) by mouth every 6 (six) hours as needed (cough).  40 tablet  1  . verapamil (VERELAN PM) 180 MG 24 hr capsule Take 180 mg by mouth daily.      . fluconazole (DIFLUCAN) 100 MG tablet Take 1 tablet (100 mg total) by mouth daily.  3 tablet  0  . mometasone-formoterol (DULERA) 200-5 MCG/ACT AERO Inhale 2 puffs into the lungs 2 (two) times daily.      . predniSONE (DELTASONE) 10 MG tablet Taper - take as directed      . ranitidine (ZANTAC) 150 MG tablet Take 150 mg by mouth daily.       No facility-administered medications prior to visit.     Review of Systems  Constitutional: Positive for fatigue. Negative for fever and chills.  HENT: Positive for ear pain, nosebleeds, postnasal drip and rhinorrhea. Negative for congestion.   Respiratory: Negative for cough, shortness of breath and wheezing.   Cardiovascular: Negative for chest pain, palpitations and leg swelling.       Objective:   Physical Exam   Filed Vitals:   05/17/13 0945  BP: 132/80  Pulse: 84  Temp: 98.3 F (36.8 C)  TempSrc: Oral  Height: 5\' 6"  (1.676 m)  Weight: 237 lb (107.502 kg)  SpO2: 95%  Room Air  Gen: overweight, well appearing, no acute distress HEENT: NCAT,  EOMi, OP clear, L TM with opaque fluid, no buldging or redness PULM: Inspiratory fine crackles from bases only, improved from prior CV: RRR, no mgr, no JVD AB: BS+, soft, nontender, no  hsm Ext: warm, trace leg edema, no clubbing, no cyanosis  February 2014 simple spirometry performed by her primary care physician>> ratio 90%, FEV1 1.71 L (64% predicted, FVC 1.83 L 55% predicted; flow volume loop is not consistent with obstruction February 2014 [facility] x[facility] at [facility] normal 10/19/2012 walked 500 feet in office on room air oxygenation did not drop below 90% 11/15/2012 Full PFT [name]> Ratio 87%, FEV1 2.00L >  2.07 with bronchodilator (3% change); TLC 3.44 L (65% pred), ERV 0.62 (57% pred), DLCO 15.1 ml/mmHg/min (59% pred)      Assessment & Plan:   Idiopathic interstitial pneumonia Kaitlin Hamilton had a repeat CT scan in [place] which showed continued interstitial lung disease. The most likely etiology at this point appears to be nonspecific interstitial pneumonitis. I do not feel that this represents usual interstitial pneumonitis. The only way to know for certain would be to perform an open lung biopsy. The illness appears to be steroid responsive in that she has had subjective improvement twice now with steroids.  We discussed the possibility of an open lung biopsy versus empiric treatment at this point I think proceeding with empiric treatment is the best approach. If she worsens then she will need an open lung biopsy.  She has esophageal dysmotility and likely some component of acid reflux contributing to her interstitial pneumonitis but I do not feel it is the primary problem.  Plan: - Prednisone for 3 months, 40 mg for a month, 30 mg for a month, 20 mg for a month -PCP prophylaxis -6 minute walk today then repeat in 3 months -Repeat full pulmonary function testing in 3 months  Esophageal dysmotility This was noted on a recent barium swallow. I will refer her to a gastroenterologist for further evaluation.  Otitis media She is to have a mild case based on exam and symptoms. I will that she use Bactrim twice a day for 5 days then start using it on a prophylactic basis to  prevent PCP.    Updated Medication List Outpatient Encounter Prescriptions as of 05/17/2013  Medication Sig Dispense Refill  . fluticasone (FLONASE) 50 MCG/ACT nasal spray Place 1 spray into the nose 2 (two) times daily.      Marland Kitchen glipiZIDE (GLUCOTROL) 5 MG tablet Take 5 mg by mouth daily.      . hydrochlorothiazide (MICROZIDE) 12.5 MG capsule Take 12.5 mg by mouth daily.      Marland Kitchen HYDROcodone-homatropine (HYCODAN) 5-1.5 MG/5ML syrup Take 5 mLs by mouth every 6 (six) hours as needed for cough.      . Hypertonic Nasal Wash (SINUS RINSE BOTTLE KIT NA) As directed as needed      . levothyroxine (SYNTHROID, LEVOTHROID) 150 MCG tablet Take 150 mcg by mouth daily.      . montelukast (SINGULAIR) 10 MG tablet Take 10 mg by mouth at bedtime.      . pantoprazole (PROTONIX) 40 MG tablet Take 40 mg by mouth daily.      . traMADol (ULTRAM) 50 MG tablet Take 1 tablet (50 mg total) by mouth every 6 (six) hours as needed (cough).  40 tablet  1  . verapamil (VERELAN PM) 180 MG 24 hr capsule Take 180 mg by mouth daily.      . predniSONE (DELTASONE) 10 MG tablet 40mg  daily for one month, Then 30mg  daily for one month, Then 20mg  daily for one month  120 tablet  2  . sulfamethoxazole-trimethoprim (BACTRIM DS) 800-160 MG per tablet First week: One by mouth twice a day for five days Then take one every mon, wed, friday  22 tablet  2  . [DISCONTINUED] fluconazole (DIFLUCAN) 100 MG tablet Take 1 tablet (100 mg total) by mouth daily.  3 tablet  0  . [DISCONTINUED] mometasone-formoterol (DULERA) 200-5 MCG/ACT AERO Inhale 2 puffs into the lungs 2 (two) times daily.      . [DISCONTINUED] predniSONE (DELTASONE) 10 MG tablet Taper - take as directed      . [  DISCONTINUED] ranitidine (ZANTAC) 150 MG tablet Take 150 mg by mouth daily.       No facility-administered encounter medications on file as of 05/17/2013.

## 2013-05-17 NOTE — Assessment & Plan Note (Signed)
She is to have a mild case based on exam and symptoms. I will that she use Bactrim twice a day for 5 days then start using it on a prophylactic basis to prevent PCP.

## 2013-05-17 NOTE — Patient Instructions (Signed)
I believe that you have Non-specific Intersitial Pneumonitis The only way to really diagnose this is with a surgical lung biopsy, we are going to try to avoid that  Take the prednisone 40mg  daily for one month, then 30mg  daily for one month, then 20mg  daily for one month  Take the bactrim twice a day for five days for your ear infection  Then take the bactrim one pill every Mon, Wed, Friday  We will see you back in 3 months and do another 6 minute walk again

## 2013-05-17 NOTE — Assessment & Plan Note (Signed)
This was noted on a recent barium swallow. I will refer her to a gastroenterologist for further evaluation.

## 2013-05-17 NOTE — Assessment & Plan Note (Signed)
Kaitlin Hamilton had a repeat CT scan in [place] which showed continued interstitial lung disease. The most likely etiology at this point appears to be nonspecific interstitial pneumonitis. I do not feel that this represents usual interstitial pneumonitis. The only way to know for certain would be to perform an open lung biopsy. The illness appears to be steroid responsive in that she has had subjective improvement twice now with steroids.  We discussed the possibility of an open lung biopsy versus empiric treatment at this point I think proceeding with empiric treatment is the best approach. If she worsens then she will need an open lung biopsy.  She has esophageal dysmotility and likely some component of acid reflux contributing to her interstitial pneumonitis but I do not feel it is the primary problem.  Plan: - Prednisone for 3 months, 40 mg for a month, 30 mg for a month, 20 mg for a month -PCP prophylaxis -6 minute walk today then repeat in 3 months -Repeat full pulmonary function testing in 3 months

## 2013-05-18 ENCOUNTER — Encounter: Payer: Self-pay | Admitting: Pulmonary Disease

## 2013-05-18 LAB — ANA: Anti Nuclear Antibody(ANA): NEGATIVE

## 2013-05-18 LAB — CYCLIC CITRUL PEPTIDE ANTIBODY, IGG: Cyclic Citrullin Peptide Ab: <2 U/mL (ref 0.0–5.0)

## 2013-05-18 LAB — ANTI-JO 1 ANTIBODY, IGG: Anti JO-1: <0.2 AI (ref 0.0–0.9)

## 2013-05-20 LAB — ALDOLASE: Aldolase: 7.1 U/L (ref <=8.1)

## 2013-05-20 LAB — CENTROMERE ANTIBODIES: Centromere Ab Screen: <1 AU/mL (ref <30)

## 2013-05-20 LAB — SJOGRENS SYNDROME-A EXTRACTABLE NUCLEAR ANTIBODY: SSA (Ro) (ENA) Antibody, IgG: 4 AU/mL (ref <30)

## 2013-05-21 HISTORY — PX: COLONOSCOPY: SHX174

## 2013-05-24 ENCOUNTER — Telehealth: Payer: Self-pay | Admitting: Pulmonary Disease

## 2013-05-24 NOTE — Telephone Encounter (Signed)
[id number] x1 We do not have [name] listed on pt's HIPAA, so we can not discuss pt's health with her.

## 2013-05-26 ENCOUNTER — Encounter: Payer: Self-pay | Admitting: Pulmonary Disease

## 2013-05-26 NOTE — Telephone Encounter (Signed)
Dr. Kendrick Fries, this pt daughter is requesting to speak to you about the pt. She states she worked with you at [employer]. There is not a release signed allowing [place] to discuss anything with the pt daughter. Carron Curie, CMA

## 2013-05-27 ENCOUNTER — Encounter: Payer: Self-pay | Admitting: Internal Medicine

## 2013-05-27 LAB — HYPERSENSITIVITY PNUEMONITIS PROFILE

## 2013-05-31 NOTE — Telephone Encounter (Signed)
Hi, I spoke with Kaitlin Hamilton last Thursday.  I know her well from [facility]

## 2013-06-01 ENCOUNTER — Encounter: Payer: Self-pay | Admitting: Internal Medicine

## 2013-06-01 ENCOUNTER — Ambulatory Visit (INDEPENDENT_AMBULATORY_CARE_PROVIDER_SITE_OTHER): Payer: BC Managed Care – PPO | Admitting: Internal Medicine

## 2013-06-01 VITALS — BP 134/78 | HR 66 | Ht 66.0 in | Wt 241.0 lb

## 2013-06-01 DIAGNOSIS — K224 Dyskinesia of esophagus: Secondary | ICD-10-CM

## 2013-06-01 DIAGNOSIS — K219 Gastro-esophageal reflux disease without esophagitis: Secondary | ICD-10-CM

## 2013-06-01 DIAGNOSIS — R1310 Dysphagia, unspecified: Secondary | ICD-10-CM

## 2013-06-01 DIAGNOSIS — Z1211 Encounter for screening for malignant neoplasm of colon: Secondary | ICD-10-CM

## 2013-06-01 NOTE — Progress Notes (Signed)
Patient ID: Kaitlin Hamilton, female   DOB: 1951/06/30, 62 y.o.   MRN: [id number] HPI: [name] is a 62 yo female with PMH of interstitial lung disease, migraines, hypothyroidism, diabetes, hyperlipidemia, and GERD who seen in consultation at the request of Dr. Kendrick Fries for evaluation of reflux and esophageal dysmotility. She is here today with a friend.  She reports that she has recently been started on prednisone 40 mg daily for interstitial lung disease. Dr. Kendrick Fries has been evaluating this and they are also considering an open lung biopsy for definitive diagnosis. She reports prednisone has significantly improved her dyspnea, but she has noticed significant weight gain. She wonders if she will be able to complete the prednisone taper as ordered. She started 40 mg daily on 05/17/2013 with plans to decrease by 10 mg every month over a three-month period.  In the workup for lung disease she had a barium swallow and she was told that she may have esophageal "muscle trouble".  She does report a history of heartburn which has been present off and on for years. She has been started on pantoprazole 40 mg daily and this controls her heartburn very well. With this medication she rarely if ever has heartburn. She's also made lifestyle modification including avoiding trigger foods and avoiding eating late at night. She occasionally reports dysphagia or feeling as if food sticks in her mid to lower chest. She reports this is usually worse when she gets "nervous or upset". Occasionally she reports epigastric discomfort also with nervousness or anxiety. It is usually solid foods such as meats that get "hung" and on occasion she regurgitates or vomits these back up. She recalls this has only happened 2 times in the last 2 months. She reports regular bowel habits without blood or melena. She has never had any endoscopic procedure including no prior screening colonoscopy  No FH of CRC.  Past Medical History  Diagnosis Date  .  Asthma   . Bronchitis   . Chicken pox   . Depression   . Migraines   . OA (osteoarthritis)   . Hypothyroidism (acquired)   . Allergic rhinitis   . Non-insulin dependent type 2 diabetes mellitus   . Hyperlipidemia   . Hypokalemia   . Anxiety   . GERD (gastroesophageal reflux disease)     Past Surgical History  Procedure Laterality Date  . Tubal ligation    . Cholecystectomy    . Appendectomy    . Abdominal exploration surgery      For Ovarian Cyst     Current Outpatient Prescriptions  Medication Sig Dispense Refill  . fluticasone (FLONASE) 50 MCG/ACT nasal spray Place 1 spray into the nose 2 (two) times daily.      Marland Kitchen glipiZIDE (GLUCOTROL) 5 MG tablet Take 5 mg by mouth daily.      . hydrochlorothiazide (MICROZIDE) 12.5 MG capsule Take 12.5 mg by mouth daily.      . Hypertonic Nasal Wash (SINUS RINSE BOTTLE KIT NA) As directed as needed      . levothyroxine (SYNTHROID, LEVOTHROID) 150 MCG tablet Take 150 mcg by mouth daily.      . montelukast (SINGULAIR) 10 MG tablet Take 10 mg by mouth at bedtime.      . pantoprazole (PROTONIX) 40 MG tablet Take 40 mg by mouth daily.      . predniSONE (DELTASONE) 10 MG tablet 40mg  daily for one month, Then 30mg  daily for one month, Then 20mg  daily for one month  120 tablet  2  . sulfamethoxazole-trimethoprim (BACTRIM DS) 800-160 MG per tablet First week: One by mouth twice a day for five days Then take one every mon, wed, friday  22 tablet  2  . verapamil (VERELAN PM) 180 MG 24 hr capsule Take 180 mg by mouth daily.       No current facility-administered medications for this visit.    Allergies  Allergen Reactions  . Codeine Hives and Swelling  . Demerol [Meperidine] Hives and Swelling    Family History  Problem Relation Age of Onset  . Rheum arthritis Mother   . Rheum arthritis Sister   . Prostate cancer Brother   . Heart disease Maternal Grandmother   . Heart disease Maternal Grandfather   . Uterine cancer Daughter   . Colon  cancer Neg Hx     History  Substance Use Topics  . Smoking status: Never Smoker   . Smokeless tobacco: Never Used  . Alcohol Use: No    ROS: As per history of present illness, otherwise negative  BP 134/78  Pulse 66  Ht 5\' 6"  (1.676 m)  Wt 241 lb (109.317 kg)  BMI 38.92 kg/m2 Constitutional: Well-developed and well-nourished. No distress. HEENT: Normocephalic and atraumatic. Oropharynx is clear and moist. No oropharyngeal exudate. Conjunctivae are normal.  No scleral icterus. Neck: Neck supple. Trachea midline. Cardiovascular: Normal rate, regular rhythm and intact distal pulses.  Pulmonary/chest: Effort normal with fine bibasilar crackles, no wheezing Abdominal: Soft, obese, nontender, nondistended. Bowel sounds active throughout.  Extremities: no clubbing, cyanosis, or edema Neurological: Alert and oriented to person place and time. Skin: Skin is warm and dry. No rashes noted. Psychiatric: Normal mood and affect. Behavior is normal.  RELEVANT LABS AND IMAGING: CBC    Component Value Date/Time   WBC 10.6* 04/20/2013 0809   RBC 4.48 04/20/2013 0809   HGB 13.3 04/20/2013 0809   HCT 38.9 04/20/2013 0809   PLT 317 04/20/2013 0809   MCV 86.8 04/20/2013 0809   MCH 29.7 04/20/2013 0809   MCHC 34.2 04/20/2013 0809   RDW 14.4 04/20/2013 0809   LYMPHSABS 3.3 04/20/2013 0809   MONOABS 0.6 04/20/2013 0809   EOSABS 0.2 04/20/2013 0809   BASOSABS 0.0 04/20/2013 0809   ESOPHOGRAM / BARIUM SWALLOW / BARIUM TABLET STUDY - 04/28/2013   TECHNIQUE: Combined double contrast and single contrast examination performed using effervescent crystals, thick barium liquid, and thin barium liquid. The patient was observed with fluoroscopy swallowing a 13mm barium sulphate tablet.   COMPARISON:  No priors.   FLUOROSCOPY TIME:  3 min and 48 seconds.   FINDINGS: Initial double contrast barium esophagram demonstrated a normal appearance of the esophageal mucosa. Small hiatal hernia was noted. Multiple  single swallow attempts were observed, and there was failure to propagate 2/5 primary peristaltic waves. Additionally, multiple strong tertiary contractions were noted throughout the examination. Full column barium esophagram redemonstrated the presence of the small hiatal hernia, but demonstrated no evidence of mass, stricture or significant esophageal ring. Water siphon test demonstrated mild gastroesophageal reflux. A barium tablet was administered, which passed readily into the stomach.   IMPRESSION: 1. Nonspecific esophageal motility disorder. Strong tertiary contractions were noted throughout the examination. 2. Gastroesophageal reflux was observed during the examination. 3. Small hiatal hernia.  ASSESSMENT/PLAN:  62 yo female with PMH of interstitial lung disease, migraines, hypothyroidism, diabetes, hyperlipidemia, and GERD who seen in consultation at the request of Dr. Kendrick Fries for evaluation of reflux and esophageal dysmotility.  1.  GERD/dysphagia/esophageal dysmotility -- it  seems that pantoprazole 40 mg daily has worked well to control her GERD symptoms. Her dysphagia is intermittent and most likely secondary to esophageal dysmotility given the results of the barium swallow. A barium swallow did not show any evidence for stricture or mass lesion. We discussed how reflux can contribute to lung inflammation, though Dr. Kendrick Fries feels it is certainly not the only factor.  Her symptoms do not sound consistent with achalasia at this time. Given her long-standing history of reflux and dysphagia we will proceed first with upper endoscopy. This will rule out any ongoing esophageal inflammation such as esophagitis, but also screen for Barrett's esophagus.  We discussed up EGD does not tell us much about esophageal motility, and if the endoscopy is normal, we would likely proceed to the esophageal manometry. In the meantime she will continue pantoprazole 40 mg daily. She is reminded to take this  30 minutes to one hour before her first meal of the day. Upper endoscopy was discussed, including the risks and benefits and she is agreeable to proceed  2.  CRC screening -- we discussed colorectal cancer screening and I have recommended colonoscopy. After discussing the test in detail including the risks and benefits, she wishes to pursue colonoscopy on the same day as her upper endoscopy.

## 2013-06-01 NOTE — Patient Instructions (Signed)
You have been scheduled for a colonoscopy/Endoscopy with propofol. Please follow written instructions given to you at your visit today.  Please pick up your prep kit at the pharmacy within the next 1-3 days. If you use inhalers (even only as needed), please bring them with you on the day of your procedure. Your physician has requested that you go to www.startemmi.com and enter the access code given to you at your visit today. This web site gives a general overview about your procedure. However, you should still follow specific instructions given to you by our office regarding your preparation for the procedure.   Continue taking protonix daily  We have sent the following medications to your pharmacy for you to pick up at your convenience: Moviprep                                               We are excited to introduce MyChart, a new best-in-class service that provides you online access to important information in your electronic medical record. We want to make it easier for you to view your health information - all in one secure location - when and where you need it. We expect MyChart will enhance the quality of care and service we provide.  When you register for MyChart, you can:    View your test results.    Request appointments and receive appointment reminders via email.    Request medication renewals.    View your medical history, allergies, medications and immunizations.    Communicate with your physician's office through a password-protected site.    Conveniently print information such as your medication lists.  To find out if MyChart is right for you, please talk to a member of our clinical staff today. We will gladly answer your questions about this free health and wellness tool.  If you are age 46 or older and want a member of your family to have access to your record, you must provide written consent by completing a proxy form available at our office. Please speak to our  clinical staff about guidelines regarding accounts for patients younger than age 63.  As you activate your MyChart account and need any technical assistance, please call the MyChart technical support line at [phone]-CHART [phone]) or email your question to [email or web address]. If you email your question(s), please include your name, a return phone number and the best time to reach you.  If you have non-urgent health-related questions, you can send a message to our office through MyChart at [email or web address]. If you have a medical emergency, call 911.  Thank you for using MyChart as your new health and wellness resource!   MyChart licensed from [employer],  4540-9811. Patents Pending.

## 2013-06-02 ENCOUNTER — Telehealth: Payer: Self-pay | Admitting: Internal Medicine

## 2013-06-03 NOTE — Telephone Encounter (Signed)
Spoke to pt told her I will leave her a free prep at our front desk

## 2013-06-08 ENCOUNTER — Encounter: Payer: Self-pay | Admitting: Pulmonary Disease

## 2013-06-10 ENCOUNTER — Telehealth: Payer: Self-pay | Admitting: Pulmonary Disease

## 2013-06-10 DIAGNOSIS — J849 Interstitial pulmonary disease, unspecified: Secondary | ICD-10-CM

## 2013-06-10 NOTE — Telephone Encounter (Signed)
I spoke with pt. She reports next year her deductible is going up to $3000. She wants to go ahead and get the lung bx done done if possible. Please advise Dr. Kendrick Fries thanks

## 2013-06-10 NOTE — Telephone Encounter (Signed)
I called Ms. Kaitlin Hamilton to discuss her progress with prednisone.  She feels less short of breath, but has gained 15 pounds and is very shaky and not tolerating the side effects.  She and her daughter have been talking a lot about her lung disease and she now feels that she would like to have an open lung biopsy.  This is very reasonable.  For the biopsy, I want her to wean steroids. So  I instructed her how to taper off over the next 8 days.  So I am cc'ing triage to help coordinate the following:  1) Consult Thoracic surgery in [facility] for an Open Lung Biopsy.  I want the biopsy no sooner than 12/15 because I want her off of steroids for the biopsy  2) Cancel our appointment on 12/3 and see Thoracic surgery instead  Yolonda Kida PCCM Pager: [phone] Cell: [phone] If no response, call [phone]

## 2013-06-13 ENCOUNTER — Telehealth: Payer: Self-pay | Admitting: Pulmonary Disease

## 2013-06-13 NOTE — Telephone Encounter (Signed)
Left detailed message on pt VM. Will sign off message

## 2013-06-13 NOTE — Telephone Encounter (Signed)
Pt is returning triage's call.  Pt asks for a detailed message on her VM as she is at work & will not be able to answer her phone.  Kaitlin Hamilton

## 2013-06-13 NOTE — Telephone Encounter (Signed)
lmomtcb x1 for pt 

## 2013-06-13 NOTE — Telephone Encounter (Signed)
Yes, she can come off of the bactrim now

## 2013-06-13 NOTE — Telephone Encounter (Signed)
Kaitlin Leash, MD at 06/10/2013 2:16 PM    Status: Signed        I called Kaitlin Hamilton to discuss her progress with prednisone. She feels less short of breath, but has gained 15 pounds and is very shaky and not tolerating the side effects.  She and her daughter have been talking a lot about her lung disease and she now feels that she would like to have an open lung biopsy. This is very reasonable.  For the biopsy, I want her to wean steroids. So I instructed her how to taper off over the next 8 days.  So I am cc'ing triage to help coordinate the following:  1) Consult Thoracic surgery in [facility] for an Open Lung Biopsy. I want the biopsy no sooner than 12/15 because I want her off of steroids for the biopsy  2) Cancel our appointment on 12/3 and see Thoracic surgery instead    I called and spoke with and she is wanting to know since she is weening off the prednisone, does she also need to come off the ABX? Please advise Dr. Kendrick Fries thanks

## 2013-06-14 ENCOUNTER — Encounter: Payer: Self-pay | Admitting: Internal Medicine

## 2013-06-14 ENCOUNTER — Ambulatory Visit (AMBULATORY_SURGERY_CENTER): Payer: BC Managed Care – PPO | Admitting: Internal Medicine

## 2013-06-14 VITALS — BP 125/60 | HR 67 | Temp 97.2°F | Resp 22 | Ht 66.0 in | Wt 241.0 lb

## 2013-06-14 DIAGNOSIS — D126 Benign neoplasm of colon, unspecified: Secondary | ICD-10-CM

## 2013-06-14 DIAGNOSIS — K224 Dyskinesia of esophagus: Secondary | ICD-10-CM

## 2013-06-14 DIAGNOSIS — R1310 Dysphagia, unspecified: Secondary | ICD-10-CM

## 2013-06-14 DIAGNOSIS — K219 Gastro-esophageal reflux disease without esophagitis: Secondary | ICD-10-CM

## 2013-06-14 DIAGNOSIS — Z1211 Encounter for screening for malignant neoplasm of colon: Secondary | ICD-10-CM

## 2013-06-14 DIAGNOSIS — K2970 Gastritis, unspecified, without bleeding: Secondary | ICD-10-CM

## 2013-06-14 DIAGNOSIS — K2990 Gastroduodenitis, unspecified, without bleeding: Secondary | ICD-10-CM

## 2013-06-14 DIAGNOSIS — A048 Other specified bacterial intestinal infections: Secondary | ICD-10-CM

## 2013-06-14 MED ORDER — SODIUM CHLORIDE 0.9 % IV SOLN: 500.0000 mL | INTRAVENOUS | Status: DC

## 2013-06-14 NOTE — Op Note (Signed)
[facility] [address] [place], [address]   COLONOSCOPY PROCEDURE REPORT  PATIENT: Kaitlin Hamilton, Kaitlin Hamilton  MR#: [id number] BIRTHDATE: July 23, 1950 , 62  yrs. old GENDER: Female ENDOSCOPIST: Beverley Fiedler, MD PROCEDURE DATE:  06/14/2013 PROCEDURE:   Colonoscopy with snare polypectomy First Screening Colonoscopy - Avg.  risk and is 50 yrs.  old or older Yes.  Prior Negative Screening - Now for repeat screening. N/A  History of Adenoma - Now for follow-up colonoscopy & has been > or = to 3 yrs.  N/A  Polyps Removed Today? Yes. ASA CLASS:   Class III INDICATIONS:average risk screening and first colonoscopy. MEDICATIONS: MAC sedation, administered by CRNA and Propofol (Diprivan) 300 mg IV  DESCRIPTION OF PROCEDURE:   After the risks benefits and alternatives of the procedure were thoroughly explained, informed consent was obtained.  A digital rectal exam revealed hemorrhoids. The LB [id number]  endoscope was introduced through the anus and advanced to the cecum, which was identified by both the appendix and ileocecal valve. No adverse events experienced.   The quality of the prep was Moviprep fair  The instrument was then slowly withdrawn as the colon was fully examined.    COLON FINDINGS: Two sessile polyps measuring 4-6 mm in size were found in the ascending colon.  A polypectomy was performed with a cold snare (1) and using snare cautery (1).  The resection was complete and the polyp tissue was completely retrieved.   Two sessile polyps ranging between 3-54mm in size were found in the rectosigmoid colon and rectum.  Polypectomy was performed using cold snare.  All resections were complete and all polyp tissue was completely retrieved.   There was severe diverticulosis noted in the ascending colon, descending colon, and sigmoid colon with associated muscular hypertrophy.  Retroflexed views revealed internal/external hemorrhoids. The time to cecum=3 minutes  18 seconds.  Withdrawal time=17 minutes 50 seconds.  The scope was withdrawn and the procedure completed.  COMPLICATIONS: There were no complications.   ENDOSCOPIC IMPRESSION: 1.   Two sessile polyps measuring 4-6 mm in size were found in the ascending colon; polypectomy was performed with a cold snare and using snare cautery 2.   Two sessile polyps ranging between 3-75mm in size were found in the rectosigmoid colon and rectum; Polypectomy was performed using cold snare 3.   There was severe diverticulosis noted in the ascending colon, descending colon, and sigmoid colon  RECOMMENDATIONS: 1.  Await pathology results 2.  Hold aspirin, aspirin products, and anti-inflammatory medication for 1 week. 3.  High fiber diet 4.  Repeat Colonoscopy in 3 years. 5.  You will receive a letter within 1-2 weeks with the results of your biopsy as well as final recommendations.  Please call my office if you have not received a letter after 3 weeks.   eSigned:  Beverley Fiedler, MD 06/14/2013 4:00 PM   cc: The Patient and Lorie Phenix, MD   PATIENT NAME:  [name], [name] MR#: [id number]

## 2013-06-14 NOTE — Op Note (Addendum)
[facility] [address] [place], [address]   ENDOSCOPY PROCEDURE REPORT  PATIENT: Kaitlin Hamilton, Kaitlin Hamilton  MR#: [id number] BIRTHDATE: Jan 11, 1951 , 62  yrs. old GENDER: Female ENDOSCOPIST: Beverley Fiedler, MD REFERRED BY:  Lupita Leash, M.D. PROCEDURE DATE:  06/14/2013 PROCEDURE:  EGD w/ biopsy; EGD with balloon dilation ASA CLASS:     Class III INDICATIONS:  Dysphagia.   history of esophageal reflux. MEDICATIONS: MAC sedation, administered by CRNA and propofol (Diprivan) 350mg  IV TOPICAL ANESTHETIC: Cetacaine Spray  DESCRIPTION OF PROCEDURE: After the risks benefits and alternatives of the procedure were thoroughly explained, informed consent was obtained.  The LB [id number] endoscope was introduced through the mouth and advanced to the second portion of the duodenum. Without limitations.  The instrument was slowly withdrawn as the mucosa was fully examined.     ESOPHAGUS: A non-obstructing Schatzki ring was found 35 cm from the incisors.  Balloon dilation was performed across the Schatzki's ring using TTS balloon 18 mm.  The 18 mm balloon moved very easily across the nonobstructing ring. Cold forceps were used to disrupt the ring in 4 quadrants. The esophagus was otherwise normal.   A 4 cm hiatal hernia was noted.  STOMACH: Gastropathy was found in the gastric body and gastric antrum.  Multiple biopsies were performed using cold forceps.  DUODENUM: The duodenal mucosa showed no abnormalities in the bulb and second portion of the duodenum.  Retroflexed views revealed a hiatal hernia.     The scope was then withdrawn from the patient and the procedure completed.  COMPLICATIONS: There were no complications.  ENDOSCOPIC IMPRESSION: 1.   Schatzki ring was found 35 cm from the incisors.  Balloon dilation to 18 mm, 4 quadrant biopsies at the ring 2.   The esophagus was otherwise normal. 3.   4 cm hiatal hernia 4.   Gastropathy was found in the gastric body  and gastric antrum; multiple biopsies 5.   The duodenal mucosa showed no abnormalities in the bulb and second portion of the duodenum  RECOMMENDATIONS: 1.  Await pathology results 2.  Continue taking your PPI (antiacid medicine) once daily.  It is best to be taken 20-30 minutes prior to breakfast meal. 3.  If no benefit in dysphagia symptoms after dilation and biopsy, would proceed to esophageal manometry   eSigned:  Beverley Fiedler, MD 06/14/2013 4:01 PM Revised: 06/14/2013 4:01 PM  CC:The Patient and Lorie Phenix, MD; Lupita Leash, M.D.  PATIENT NAME:  [name], [name] MR#: [id number]

## 2013-06-14 NOTE — Patient Instructions (Signed)
YOU HAD AN ENDOSCOPIC PROCEDURE TODAY AT THE [facility]: Refer to the procedure report that was given to you for any specific questions about what was found during the examination.  If the procedure report does not answer your questions, please call your gastroenterologist to clarify.  If you requested that your care partner not be given the details of your procedure findings, then the procedure report has been included in a sealed envelope for you to review at your convenience later.  YOU SHOULD EXPECT: Some feelings of bloating in the abdomen. Passage of more gas than usual.  Walking can help get rid of the air that was put into your GI tract during the procedure and reduce the bloating. If you had a lower endoscopy (such as a colonoscopy or flexible sigmoidoscopy) you may notice spotting of blood in your stool or on the toilet paper. If you underwent a bowel prep for your procedure, then you may not have a normal bowel movement for a few days.  DIET: FOLLOW DILATATION DIET (GIVEN TO YOU ) TODAY  ACTIVITY: Your care partner should take you home directly after the procedure.  You should plan to take it easy, moving slowly for the rest of the day.  You can resume normal activity the day after the procedure however you should NOT DRIVE or use heavy machinery for 24 hours (because of the sedation medicines used during the test).    SYMPTOMS TO REPORT IMMEDIATELY: A gastroenterologist can be reached at any hour.  During normal business hours, 8:30 AM to 5:00 PM Monday through Friday, call [phone].  After hours and on weekends, please call the GI answering service at [phone] who will take a message and have the physician on call contact you.   Following lower endoscopy (colonoscopy or flexible sigmoidoscopy):  Excessive amounts of blood in the stool  Significant tenderness or worsening of abdominal pains  Swelling of the abdomen that is new, acute  Fever of 100F or  higher  Following upper endoscopy (EGD)  Vomiting of blood or coffee ground material  New chest pain or pain under the shoulder blades  Painful or persistently difficult swallowing  New shortness of breath  Fever of 100F or higher  Black, tarry-looking stools  FOLLOW UP: If any biopsies were taken you will be contacted by phone or by letter within the next 1-3 weeks.  Call your gastroenterologist if you have not heard about the biopsies in 3 weeks.  Our staff will call the home number listed on your records the next business day following your procedure to check on you and address any questions or concerns that you may have at that time regarding the information given to you following your procedure. This is a courtesy call and so if there is no answer at the home number and we have not heard from you through the emergency physician on call, we will assume that you have returned to your regular daily activities without incident.  SIGNATURES/CONFIDENTIALITY: You and/or your care partner have signed paperwork which will be entered into your electronic medical record.  These signatures attest to the fact that that the information above on your After Visit Summary has been reviewed and is understood.  Full responsibility of the confidentiality of this discharge information lies with you and/or your care-partner.   HOLD ASPIRIN & ANTI INFLAMMATORY PRODUCTS FOR 2 WEEKS  Information on polyps ,diverticulosis, & high fiber diet information given to you today  Information on gastritis ,  esophageal dilatation diet for today, & hiatal hernia given to you today

## 2013-06-14 NOTE — Progress Notes (Signed)
Patient did not experience any of the following events: a burn prior to discharge; a fall within the facility; wrong site/side/patient/procedure/implant event; or a hospital transfer or hospital admission upon discharge from the facility. (G8907) Patient did not have preoperative order for IV antibiotic SSI prophylaxis. (G8918)  

## 2013-06-14 NOTE — Progress Notes (Addendum)
Called to room to assist during endoscopic procedure.  Patient ID and intended procedure confirmed with present staff. Received instructions for my participation in the procedure from the performing physician.  

## 2013-06-15 ENCOUNTER — Telehealth: Payer: Self-pay | Admitting: *Deleted

## 2013-06-15 NOTE — Telephone Encounter (Signed)
  Follow up Call-  Call back number 06/14/2013  Post procedure Call Back phone  # [phone]  Permission to leave phone message Yes     Patient questions:  Do you have a fever, pain , or abdominal swelling? no Pain Score  0 *  Have you tolerated food without any problems? yes  Have you been able to return to your normal activities? yes  Do you have any questions about your discharge instructions: Diet   no Medications  no Follow up visit  no  Do you have questions or concerns about your Care? no  Actions: * If pain score is 4 or above: No action needed, pain <4.

## 2013-06-21 ENCOUNTER — Ambulatory Visit: Payer: Self-pay | Admitting: Family Medicine

## 2013-06-21 ENCOUNTER — Encounter: Payer: Self-pay | Admitting: Thoracic Surgery (Cardiothoracic Vascular Surgery)

## 2013-06-21 ENCOUNTER — Institutional Professional Consult (permissible substitution) (INDEPENDENT_AMBULATORY_CARE_PROVIDER_SITE_OTHER): Payer: BC Managed Care – HMO | Admitting: Thoracic Surgery (Cardiothoracic Vascular Surgery)

## 2013-06-21 ENCOUNTER — Encounter: Payer: Self-pay | Admitting: Internal Medicine

## 2013-06-21 VITALS — BP 159/92 | HR 79 | Resp 20 | Ht 66.0 in | Wt 241.0 lb

## 2013-06-21 DIAGNOSIS — J8410 Pulmonary fibrosis, unspecified: Secondary | ICD-10-CM

## 2013-06-21 DIAGNOSIS — J849 Interstitial pulmonary disease, unspecified: Secondary | ICD-10-CM

## 2013-06-21 NOTE — Progress Notes (Addendum)
PCP is MALONEY,NANCY, MD Referring Provider is Maloney, Nancy, MD/ MCQUAID, BRENT, MD  Chief Complaint  Patient presents with  . Interstitial Lung Disease    Surgical eval for possible BX, Chest CT 04/26/13     HPI: Kaitlin Hamilton presents for evaluation for possible thoracoscopic lung biopsy.  Kaitlin Hamilton is a 62-year-old woman with interstitial lung disease. She first started having problems a couple of years ago. Her breathing would get worse in the winter. She says that she would catch and pulled metabolic moved down into her chest and inability persistent over a long period of time. She would have coughing, occasionally productive. She also would have wheezing and shortness of breath with exertion. Symptoms tended to diminish in the summer. Last winter she had multiple episodes and was treated with multiple rounds of antibiotics. She was noted to have crackles on exam and an abnormal PFT and was referred to Dr. McQuaid. Pulmonary function testing showed moderate restriction with a portion of reduction and her DLCO. There was no air flow obstruction. She was treated with steroids and had improvement in her symptoms.  She was having worsening symptoms again in October. She saw Dr. McQuaid at that time. A CT showed interstitial lung disease. The possibility of a lung biopsy was discussed, but she was started on empiric prednisone. She had an immediate response with improvement of her symptoms. However she noted a 15 pound weight gain over a 6 week period and is concerned about staying on prednisone long-term. After further discussion with Dr. McQuaid she decided she would agree to a lung biopsy to establish a diagnosis and guide therapy.  Dr. McQuaid wanted her off of steroids prior to the lung biopsy. Interestingly as soon as the steroid dose was reduced steroids her symptoms recurred.  She also has reflux and did have endoscopy with biopsy recently which showed H. pylori.     Past Medical History   Diagnosis Date  . Asthma   . Bronchitis   . Chicken pox   . Depression   . Migraines   . OA (osteoarthritis)   . Hypothyroidism (acquired)   . Allergic rhinitis   . Non-insulin dependent type 2 diabetes mellitus   . Hyperlipidemia   . Hypokalemia   . Anxiety   . GERD (gastroesophageal reflux disease)     Past Surgical History  Procedure Laterality Date  . Tubal ligation    . Cholecystectomy    . Appendectomy    . Abdominal exploration surgery      For Ovarian Cyst     Family History  Problem Relation Age of Onset  . Rheum arthritis Mother   . Rheum arthritis Sister   . Prostate cancer Brother   . Heart disease Maternal Grandmother   . Heart disease Maternal Grandfather   . Uterine cancer Daughter   . Colon cancer Neg Hx     Social History History  Substance Use Topics  . Smoking status: Never Smoker   . Smokeless tobacco: Never Used  . Alcohol Use: No    Current Outpatient Prescriptions  Medication Sig Dispense Refill  . fluticasone (FLONASE) 50 MCG/ACT nasal spray Place 1 spray into the nose 2 (two) times daily.      . glipiZIDE (GLUCOTROL) 5 MG tablet Take 5 mg by mouth daily.      . hydrochlorothiazide (MICROZIDE) 12.5 MG capsule Take 12.5 mg by mouth daily.      . Hypertonic Nasal Wash (SINUS RINSE BOTTLE KIT NA) As directed   as needed      . levothyroxine (SYNTHROID, LEVOTHROID) 150 MCG tablet Take 150 mcg by mouth daily.      . montelukast (SINGULAIR) 10 MG tablet Take 10 mg by mouth at bedtime.      . pantoprazole (PROTONIX) 40 MG tablet Take 40 mg by mouth daily.      . verapamil (VERELAN PM) 180 MG 24 hr capsule Take 180 mg by mouth daily.       No current facility-administered medications for this visit.    Allergies  Allergen Reactions  . Codeine Hives and Swelling  . Demerol [Meperidine] Hives and Swelling    Review of Systems  Constitutional: Positive for appetite change (increased) and unexpected weight change (15 pound weight gain on  prednisone).  Respiratory: Positive for cough, shortness of breath and wheezing ("a little").   Cardiovascular: Negative for chest pain.  Gastrointestinal:       Hiatal hernia/reflux  Genitourinary:       Kidney stones  Musculoskeletal:       Leg cramps  Neurological: Positive for headaches.  Psychiatric/Behavioral: The patient is nervous/anxious (due to prednisone).   All other systems reviewed and are negative.    BP 159/92  Pulse 79  Resp 20  Ht 5' 6" (1.676 m)  Wt 241 lb (109.317 kg)  BMI 38.92 kg/m2  SpO2 96% Physical Exam  Vitals reviewed. Constitutional: She is oriented to person, place, and time. No distress.  Morbidly obese  HENT:  Head: Normocephalic and atraumatic.  Eyes: EOM are normal. Pupils are equal, round, and reactive to light.  Neck: Neck supple. No thyromegaly present.  Cardiovascular: Normal rate and regular rhythm.   Murmur (2/6 systolic) heard. Pulmonary/Chest: Effort normal. No respiratory distress. She has no wheezes. She has rales.  Abdominal: Soft. There is no tenderness.  Musculoskeletal: She exhibits edema.  Lymphadenopathy:    She has no cervical adenopathy.  Neurological: She is alert and oriented to person, place, and time. No cranial nerve deficit.  No focal motor deficit  Skin: Skin is warm and dry.     Diagnostic Tests: CT chest 04/22/2013 CT CHEST WITHOUT CONTRAST  TECHNIQUE:  Multidetector CT imaging of the chest was performed following the  standard protocol without IV contrast.  COMPARISON: 11/25/2012.  FINDINGS:  No pathologically enlarged mediastinal or hilar lymph nodes. Hilar  regions are difficult to definitively evaluate without IV contrast.  Heart is at the upper limits of normal in size. No pericardial  effusion. Tiny hiatal hernia.  There is a pattern of subpleural reticulation, subpleural  ground-glass, traction bronchiolectasis and mild architectural  distortion. Findings are not seen in a definite zonal  predominance.  No honeycombing. No air trapping on expiratory and expiratory  imaging. Right-sided pulmonary nodular densities measure up to 4 mm  and are unchanged. No pleural fluid. Airway is unremarkable.  Incidental imaging of the upper abdomen shows no acute findings.  Low-attenuation lesion off the upper pole left kidney measures 5.1  cm, incompletely imaged but grossly stable. No worrisome lytic or  sclerotic lesions. Degenerative changes are seen in the spine.  IMPRESSION:  1. Pulmonary parenchymal pattern of subpleural fibrosis is unchanged  from 11/25/2012 and may be due to nonspecific interstitial  pneumonitis (NSIP). Usual interstitial pneumonitis is not excluded.  2. Small pulmonary nodules are unchanged from 11/25/2012. Continued  observation is recommended. Typically, for nodules of this size in a  patient who is at increased risk for bronchogenic carcinoma, 1 year  of   documented stability is favored. This recommendation follows the  consensus statement: Guidelines for Management of Small Pulmonary  Nodules Detected on CT Scans: A Statement from the Fleischner  Society as published in Radiology 2005; 237:395-400.  Electronically Signed  By: Melinda Blietz M.D.  On: 04/22/2013 14:33   Impression: 62-year-old morbidly obese diabetic woman with interstitial lung disease. Her pulmonary symptoms are steroid responsive, but she has issues with prednisone as it relates to her diabetic control and weight.  She needs a lung biopsy to establish a diagnosis and to guide therapy.  I had a long discussion with Kaitlin Hamilton and a friend who accompanied her. We reviewed the CT scan. I recommended that we biopsy the right side as the disease is more prevalent there. She understands that this is a diagnostic and not a therapeutic procedure.  I discussed with her the indications, risks, benefits, and alternatives. We discussed the general nature of the procedure including the need for general  anesthesia, the incisions to be used, chest tube drainage postoperatively, the expected hospital stay, and the overall recovery. She understands the risks include, but are not limited to, death, MI, DVT, PE, bleeding, possible need for transfusion, infection, arrhythmias, air leaks, as well as the possibility of unforeseeable complications.  Dr. McQuade would like her off of prednisone for 2 weeks prior to the biopsy. We will plan to admit her for right VATS, lung biopsy on Monday, December 15.  Plan: Right VATS, lung biopsy Monday, December 15 

## 2013-06-22 ENCOUNTER — Ambulatory Visit: Payer: BC Managed Care – HMO | Admitting: Pulmonary Disease

## 2013-06-22 ENCOUNTER — Other Ambulatory Visit: Payer: Self-pay | Admitting: *Deleted

## 2013-06-22 ENCOUNTER — Telehealth: Payer: Self-pay | Admitting: Internal Medicine

## 2013-06-22 DIAGNOSIS — J849 Interstitial pulmonary disease, unspecified: Secondary | ICD-10-CM

## 2013-06-22 NOTE — Telephone Encounter (Signed)
Notes Recorded by Beverley Fiedler, MD on 06/21/2013 at 3:40 PM H. pylori positive, please treat with Pylera plus twice daily PPI      lmom for pt to call back.

## 2013-06-23 ENCOUNTER — Ambulatory Visit: Payer: Self-pay | Admitting: Family Medicine

## 2013-06-23 ENCOUNTER — Encounter: Payer: Self-pay | Admitting: Pulmonary Disease

## 2013-06-23 NOTE — Telephone Encounter (Signed)
Spoke with pt this am to inform her I have no record of a f/u appt, only that if she doesn't hear from [place] in 3 weeks about her bx, she is to call. Pt states she is on the way to the doctor for her cold/congestion. Informed her of H. Pylori infection and the need for an AB. She will call me after her doctor's visit and inform us about which AB was ordered.

## 2013-06-23 NOTE — Telephone Encounter (Signed)
Pt called back to report she has an ear infection and her lungs have crackles per nurse , 1/2 way up her back., but no pneumonia. She is on Doxycycline 100mg  BID and she has a cough syrup.

## 2013-06-24 ENCOUNTER — Other Ambulatory Visit: Payer: Self-pay | Admitting: Gastroenterology

## 2013-06-24 ENCOUNTER — Ambulatory Visit (INDEPENDENT_AMBULATORY_CARE_PROVIDER_SITE_OTHER): Payer: BC Managed Care – PPO | Admitting: Pulmonary Disease

## 2013-06-24 ENCOUNTER — Encounter: Payer: Self-pay | Admitting: Pulmonary Disease

## 2013-06-24 VITALS — BP 142/86 | HR 83 | Temp 97.5°F | Ht 66.0 in | Wt 247.0 lb

## 2013-06-24 DIAGNOSIS — K219 Gastro-esophageal reflux disease without esophagitis: Secondary | ICD-10-CM

## 2013-06-24 DIAGNOSIS — J84111 Idiopathic interstitial pneumonia, not otherwise specified: Secondary | ICD-10-CM

## 2013-06-24 LAB — HM DIABETES EYE EXAM

## 2013-06-24 MED ORDER — PANTOPRAZOLE SODIUM 40 MG PO TBEC: DELAYED_RELEASE_TABLET | ORAL | Status: DC

## 2013-06-24 MED ORDER — PREDNISONE 20 MG PO TABS: 40.0000 mg | ORAL_TABLET | Freq: Every day | ORAL | Status: DC

## 2013-06-24 MED ORDER — BIS SUBCIT-METRONID-TETRACYC 140-125-125 MG PO CAPS: 3.0000 | ORAL_CAPSULE | Freq: Three times a day (TID) | ORAL | Status: DC

## 2013-06-24 MED ORDER — LEVOFLOXACIN 750 MG PO TABS: 750.0000 mg | ORAL_TABLET | Freq: Every day | ORAL | Status: DC

## 2013-06-24 NOTE — Telephone Encounter (Signed)
Pt came up after seeing Dr Kendrick Fries. Dr Kendrick Fries wants her to hold the Pylera for now and she will begin taking Levaquin x 7 days and he is trying to move up her Lung BX. Pt has instructions on Pylera and PPI administration; she will call for problems.

## 2013-06-24 NOTE — Assessment & Plan Note (Addendum)
It sounds like her ILD is flaring somewhat since stopping steroids, but acute bronchitis is also possible.  She did not have pneumonia on yesterday's CXR.  Plan: -Levaquin 750mg  daily for 7 days -I will contact Dr. Dorris Fetch to see if we can move up her surgery by a few days -hold Pylera for now -resume prednisone 40mg  immediately after surgery

## 2013-06-24 NOTE — Telephone Encounter (Signed)
Spoke with [facility] Pharmacist who stated it's overkill for pt to take doxycycline and tetracycline in the Pylera pack. Spoke with Dr Elease Hashimoto at [phone] who states it's OK to hold the doxycycline if the Pylera is approved. Tom, the Pharmacist will call if there are problems.

## 2013-06-24 NOTE — Progress Notes (Signed)
Subjective:    Patient ID: Kaitlin Hamilton, female    DOB: 18-Sep-1950, 62 y.o.   MRN: [id number]  Synopsis: Kaitlin Hamilton is a very pleasant 62 year old female who first saw the [facility] pulmonary clinic in March 2014 for evaluation of shortness of breath. She had significant cough, wheezing, and sputum production requiring multiple rounds of antibiotics. Because of crackles on lung exam and an abnormal pulmonary function test she was referred to [place]. We ordered full pulmonary function testing which showed moderate restriction and a depressed DLCO in proportion to her restriction. There is no airflow obstruction and no change with bronchodilator administration. A chest x-ray performed in February 2014 was read as normal.  HPI   11/16/2012 ROV -- Kaitlin Hamilton feels that her cough and dyspnea is better since our last visit.  She is still taking Advair twice a day and rarely has to use her albuterol inhaler. She did use albuterol over the weekend when she was cleaning out a closet that was full of a lot of dust and mold. This made her short of breath and had chest congestion. The albuterol helped significantly. She stated that when she use the albuterol for the pulmonary function test yesterday she felt significant improvement in her breathing. Otherwise she is doing very well and has no symptoms.  04/19/2013 ROV -- Kaitlin Hamilton has had a rough time since her last visit. Unfortunately she never had a blood work done nor the barium swallow which we recommended last time. Approximately one month ago she developed a cough with some sputum production. She was treated with Augmentin as well as prednisone. She was also started on a Dulera inhaler at some point in the last month. She said that the prednisone deathly made her feel better but then her cough returned not long after she stopped that medicine. She has had some shortness of breath with this lately. The [facility] has not made any difference that she can tell. However she  does feel that the [facility] is making her blood sugar elevated.  She has been producing green sputum lately, particularly in the last three days.  05/17/2013 ROV > Kaitlin Hamilton feels like her breathing is better since the last visit.  She has been having ear pain for about 2-3 days in R ear. She has also noted some sinus symptoms since the last visit.   She continues to have a cough at night, but it is better overall and in the morning . When she bends over she has more cough.  She wonders if it is the carpet in her room. She typically works in the same room, but working in other rooms doesn't seem to make a difference in those.  Dyspnea has improved somewhat.    06/24/2013 ROV > Kaitlin Hamilton has had increased dyspnea, cough, and green mucus production ever since stopping the prednisone last week. No fevers or chills.  The dyspnea is mild.  She has not had chest pain or body aches, rash, or leg swelling.  No weight change.    She had an endoscopy which showed a Schatzki ring which was dilated by Dr. Rhea Belton.  She also had gastropathy which was H.Pylori positive.  Past Medical History  Diagnosis Date  . Asthma   . Bronchitis   . Chicken pox   . Depression   . Migraines   . OA (osteoarthritis)   . Hypothyroidism (acquired)   . Allergic rhinitis   . Non-insulin dependent type 2 diabetes mellitus   . Hyperlipidemia   .  Hypokalemia   . Anxiety   . GERD (gastroesophageal reflux disease)      Family History  Problem Relation Age of Onset  . Rheum arthritis Mother   . Rheum arthritis Sister   . Prostate cancer Brother   . Heart disease Maternal Grandmother   . Heart disease Maternal Grandfather   . Uterine cancer Daughter   . Colon cancer Neg Hx      History   Social History  . Marital Status: Widowed    Spouse Name: N/A    Number of Children: 2  . Years of Education: N/A   Occupational History  . Day Care at [employer]    Social History Main Topics  . Smoking status: Never Smoker   . Smokeless  tobacco: Never Used  . Alcohol Use: No  . Drug Use: No  . Sexual Activity: Not on file   Other Topics Concern  . Not on file   Social History Narrative   Daily caffeine      Allergies  Allergen Reactions  . Codeine Hives and Swelling  . Demerol [Meperidine] Hives and Swelling     Outpatient Prescriptions Prior to Visit  Medication Sig Dispense Refill  . fluticasone (FLONASE) 50 MCG/ACT nasal spray Place 1 spray into the nose 2 (two) times daily.      Marland Kitchen glipiZIDE (GLUCOTROL) 5 MG tablet Take 5 mg by mouth daily.      . hydrochlorothiazide (MICROZIDE) 12.5 MG capsule Take 12.5 mg by mouth daily.      . Hypertonic Nasal Wash (SINUS RINSE BOTTLE KIT NA) As directed as needed      . levothyroxine (SYNTHROID, LEVOTHROID) 150 MCG tablet Take 150 mcg by mouth daily.      . montelukast (SINGULAIR) 10 MG tablet Take 10 mg by mouth at bedtime.      . pantoprazole (PROTONIX) 40 MG tablet Take one tablet by mouth twice daily for 10 days while on PYLERA, then switch back to daily.  20 tablet  0  . verapamil (VERELAN PM) 180 MG 24 hr capsule Take 180 mg by mouth daily.      Marland Kitchen bismuth-metronidazole-tetracycline (PYLERA) 140-125-125 MG per capsule Take 3 capsules by mouth 4 (four) times daily -  before meals and at bedtime.  120 capsule  0   No facility-administered medications prior to visit.     Review of Systems  Constitutional: Positive for fatigue. Negative for fever and chills.  HENT: Positive for ear pain, nosebleeds, postnasal drip and rhinorrhea. Negative for congestion.   Respiratory: Negative for cough, shortness of breath and wheezing.   Cardiovascular: Negative for chest pain, palpitations and leg swelling.       Objective:   Physical Exam   Filed Vitals:   06/24/13 1335  BP: 142/86  Pulse: 83  Temp: 97.5 F (36.4 C)  TempSrc: Oral  Height: 5\' 6"  (1.676 m)  Weight: 247 lb (112.038 kg)  SpO2: 96%  Room Air  Gen: overweight, well appearing, no acute  distress HEENT: NCAT,  EOMi, OP clear, R TM with opaque fluid, no buldging or redness PULM: Crackles 2/3 way up bilaterally CV: RRR, no mgr, no JVD AB: BS+, soft, nontender, no hsm Ext: warm, trace leg edema, no clubbing, no cyanosis  February 2014 simple spirometry performed by her primary care physician>> ratio 90%, FEV1 1.71 L (64% predicted, FVC 1.83 L 55% predicted; flow volume loop is not consistent with obstruction February 2014 [facility] normal 10/19/2012 walked 500  feet in office on room air oxygenation did not drop below 90% 11/15/2012 Full PFT [name]> Ratio 87%, FEV1 2.00L > 2.07 with bronchodilator (3% change); TLC 3.44 L (65% pred), ERV 0.62 (57% pred), DLCO 15.1 ml/mmHg/min (59% pred) 06/23/2013 CXR > no infiltrate or significant change     Assessment & Plan:   Idiopathic interstitial pneumonia It sounds like her ILD is flaring somewhat since stopping steroids, but acute bronchitis is also possible.  She did not have pneumonia on yesterday's CXR.  Plan: -Levaquin 750mg  daily for 7 days -I will contact Dr. Dorris Fetch to see if we can move up her surgery by a few days -hold Pylera for now -resume prednisone 40mg  immediately after surgery  GERD (gastroesophageal reflux disease) I have asked that she take the Pylera after surgery since I need to treat her with Levaquin for now and we are moving up the schedule for her VATS lung biopsy.    Updated Medication List Outpatient Encounter Prescriptions as of 06/24/2013  Medication Sig  . chlorpheniramine-HYDROcodone (TUSSIONEX PENNKINETIC ER) 10-8 MG/5ML LQCR Take 5 mLs by mouth every 12 (twelve) hours as needed for cough.  . doxycycline (VIBRA-TABS) 100 MG tablet Take 100 mg by mouth 2 (two) times daily.  . fluticasone (FLONASE) 50 MCG/ACT nasal spray Place 1 spray into the nose 2 (two) times daily.  Marland Kitchen glipiZIDE (GLUCOTROL) 5 MG tablet Take 5 mg by mouth daily.  . hydrochlorothiazide (MICROZIDE) 12.5 MG capsule Take  12.5 mg by mouth daily.  . Hypertonic Nasal Wash (SINUS RINSE BOTTLE KIT NA) As directed as needed  . levothyroxine (SYNTHROID, LEVOTHROID) 150 MCG tablet Take 150 mcg by mouth daily.  . montelukast (SINGULAIR) 10 MG tablet Take 10 mg by mouth at bedtime.  . pantoprazole (PROTONIX) 40 MG tablet Take one tablet by mouth twice daily for 10 days while on PYLERA, then switch back to daily.  . verapamil (VERELAN PM) 180 MG 24 hr capsule Take 180 mg by mouth daily.  . [DISCONTINUED] bismuth-metronidazole-tetracycline (PYLERA) 140-125-125 MG per capsule Take 3 capsules by mouth 4 (four) times daily -  before meals and at bedtime.  Marland Kitchen levofloxacin (LEVAQUIN) 750 MG tablet Take 1 tablet (750 mg total) by mouth daily.  . predniSONE (DELTASONE) 20 MG tablet Take 2 tablets (40 mg total) by mouth daily with breakfast.

## 2013-06-24 NOTE — Assessment & Plan Note (Signed)
I have asked that she take the Pylera after surgery since I need to treat her with Levaquin for now and we are moving up the schedule for her VATS lung biopsy.

## 2013-06-24 NOTE — Progress Notes (Deleted)
   Subjective:    Patient ID: Kaitlin Hamilton, female    DOB: May 22, 1951, 62 y.o.   MRN: [id number]  HPI    Review of Systems     Objective:   Physical Exam        Assessment & Plan:   Idiopathic interstitial pneumonia It sounds like her ILD is flaring somewhat since stopping steroids, but acute bronchitis is also possible.  She did not have pneumonia on yesterday's CXR.  Plan: -Levaquin 750mg  daily for 7 days -I will contact Dr. Dorris Fetch to see if we can move up her surgery by a few days -hold Pylera for now -resume prednisone 40mg  immediately after surgery   Updated Medication List Outpatient Encounter Prescriptions as of 06/24/2013  Medication Sig  . bismuth-metronidazole-tetracycline (PYLERA) 140-125-125 MG per capsule Take 3 capsules by mouth 4 (four) times daily -  before meals and at bedtime.  . chlorpheniramine-HYDROcodone (TUSSIONEX PENNKINETIC ER) 10-8 MG/5ML LQCR Take 5 mLs by mouth every 12 (twelve) hours as needed for cough.  . doxycycline (VIBRA-TABS) 100 MG tablet Take 100 mg by mouth 2 (two) times daily.  . fluticasone (FLONASE) 50 MCG/ACT nasal spray Place 1 spray into the nose 2 (two) times daily.  Marland Kitchen glipiZIDE (GLUCOTROL) 5 MG tablet Take 5 mg by mouth daily.  . hydrochlorothiazide (MICROZIDE) 12.5 MG capsule Take 12.5 mg by mouth daily.  . Hypertonic Nasal Wash (SINUS RINSE BOTTLE KIT NA) As directed as needed  . levothyroxine (SYNTHROID, LEVOTHROID) 150 MCG tablet Take 150 mcg by mouth daily.  . montelukast (SINGULAIR) 10 MG tablet Take 10 mg by mouth at bedtime.  . pantoprazole (PROTONIX) 40 MG tablet Take one tablet by mouth twice daily for 10 days while on PYLERA, then switch back to daily.  . verapamil (VERELAN PM) 180 MG 24 hr capsule Take 180 mg by mouth daily.  Marland Kitchen levofloxacin (LEVAQUIN) 750 MG tablet Take 1 tablet (750 mg total) by mouth daily.

## 2013-06-24 NOTE — Patient Instructions (Signed)
Take the Levaquin for 7 days in a row Continue taking the Tussionex We will see if we can get your surgery moved up We will see you back in 3-4 weeks (after the surgery results are available)

## 2013-06-27 ENCOUNTER — Encounter (HOSPITAL_COMMUNITY): Payer: Self-pay | Admitting: Respiratory Therapy

## 2013-06-28 ENCOUNTER — Encounter (HOSPITAL_COMMUNITY): Payer: Self-pay

## 2013-06-28 ENCOUNTER — Encounter (HOSPITAL_COMMUNITY)
Admission: RE | Admit: 2013-06-28 | Discharge: 2013-06-28 | Disposition: A | Discharge status: Home / Self Care | Payer: BC Managed Care – PPO | Source: Ambulatory Visit | Attending: Thoracic Surgery (Cardiothoracic Vascular Surgery) | Admitting: Thoracic Surgery (Cardiothoracic Vascular Surgery)

## 2013-06-28 VITALS — BP 145/81 | HR 89 | Temp 97.8°F | Resp 20 | Ht 66.0 in | Wt 242.6 lb

## 2013-06-28 DIAGNOSIS — Z01812 Encounter for preprocedural laboratory examination: Secondary | ICD-10-CM | POA: Insufficient documentation

## 2013-06-28 DIAGNOSIS — J849 Interstitial pulmonary disease, unspecified: Secondary | ICD-10-CM

## 2013-06-28 DIAGNOSIS — Z01818 Encounter for other preprocedural examination: Secondary | ICD-10-CM | POA: Insufficient documentation

## 2013-06-28 HISTORY — DX: Personal history of other diseases of the respiratory system: Z87.09

## 2013-06-28 HISTORY — DX: Other specified postprocedural states: Z98.890

## 2013-06-28 HISTORY — DX: Dysphagia, unspecified: R13.10

## 2013-06-28 HISTORY — DX: Constipation, unspecified: K59.00

## 2013-06-28 HISTORY — DX: Personal history of other diseases of the digestive system: Z87.19

## 2013-06-28 HISTORY — DX: Personal history of colon polyps, unspecified: Z86.0100

## 2013-06-28 HISTORY — DX: Pneumonia, unspecified organism: J18.9

## 2013-06-28 HISTORY — DX: Shortness of breath: R06.02

## 2013-06-28 HISTORY — DX: Essential (primary) hypertension: I10

## 2013-06-28 HISTORY — DX: Insomnia, unspecified: G47.00

## 2013-06-28 HISTORY — DX: Personal history of urinary calculi: Z87.442

## 2013-06-28 HISTORY — DX: Effusion, unspecified joint: M25.40

## 2013-06-28 HISTORY — DX: Other specified postprocedural states: R11.2

## 2013-06-28 HISTORY — DX: Unspecified hemorrhoids: K64.9

## 2013-06-28 HISTORY — DX: Frequency of micturition: R35.0

## 2013-06-28 HISTORY — DX: Personal history of colonic polyps: Z86.010

## 2013-06-28 HISTORY — DX: Diverticulosis of intestine, part unspecified, without perforation or abscess without bleeding: K57.90

## 2013-06-28 LAB — COMPREHENSIVE METABOLIC PANEL
ALT: 19 U/L (ref 0–35)
AST: 19 U/L (ref 0–37)
Alkaline Phosphatase: 74 U/L (ref 39–117)
BUN: 17 mg/dL (ref 6–23)
CO2: 23 mEq/L (ref 19–32)
Chloride: 99 mEq/L (ref 96–112)
GFR calc Af Amer: >90 mL/min (ref >90)
GFR calc non Af Amer: 88 mL/min — ABNORMAL LOW (ref >90)
Glucose, Bld: 134 mg/dL — ABNORMAL HIGH (ref 70–99)
Potassium: 3.7 mEq/L (ref 3.5–5.1)
Sodium: 136 mEq/L (ref 135–145)
Total Bilirubin: 0.3 mg/dL (ref 0.3–1.2)

## 2013-06-28 LAB — BLOOD GAS, ARTERIAL
Acid-Base Excess: 2.5 mmol/L — ABNORMAL HIGH (ref 0.0–2.0)
Bicarbonate: 26 mEq/L — ABNORMAL HIGH (ref 20.0–24.0)
FIO2: 0.21 %
Patient temperature: 98.6
TCO2: 27.2 mmol/L (ref 0–100)
pH, Arterial: 7.46 — ABNORMAL HIGH (ref 7.350–7.450)
pO2, Arterial: 93.1 mmHg (ref 80.0–100.0)

## 2013-06-28 LAB — URINALYSIS, ROUTINE W REFLEX MICROSCOPIC
Glucose, UA: NEGATIVE mg/dL
Hgb urine dipstick: NEGATIVE
Specific Gravity, Urine: 1.025 (ref 1.005–1.030)
Urobilinogen, UA: 0.2 mg/dL (ref 0.0–1.0)
pH: 5 (ref 5.0–8.0)

## 2013-06-28 LAB — CBC
HCT: 42.7 % (ref 36.0–46.0)
Hemoglobin: 14.5 g/dL (ref 12.0–15.0)
RBC: 4.62 MIL/uL (ref 3.87–5.11)
RDW: 13.7 % (ref 11.5–15.5)

## 2013-06-28 LAB — PROTIME-INR: Prothrombin Time: 12.5 seconds (ref 11.6–15.2)

## 2013-06-28 LAB — ABO/RH: ABO/RH(D): O NEG

## 2013-06-28 LAB — APTT: aPTT: 24 seconds (ref 24–37)

## 2013-06-28 LAB — SURGICAL PCR SCREEN
MRSA, PCR: NEGATIVE
Staphylococcus aureus: NEGATIVE

## 2013-06-28 LAB — URINE MICROSCOPIC-ADD ON

## 2013-06-28 LAB — TYPE AND SCREEN: Antibody Screen: NEGATIVE

## 2013-06-28 MED ORDER — DEXTROSE 5 % IV SOLN
1.5000 g | INTRAVENOUS | Status: AC
  Administered 2013-06-29: 1.5 g via INTRAVENOUS
  Filled 2013-06-28: qty 1.5

## 2013-06-28 NOTE — Progress Notes (Signed)
Pt doesn't have a cardiologist  Denies ever having an echo/stress test/heart cath  Medical Md is Dr.nancy [facility]

## 2013-06-28 NOTE — Progress Notes (Signed)
06/28/13 1516  OBSTRUCTIVE SLEEP APNEA  Have you ever been diagnosed with sleep apnea through a sleep study? No  Do you snore loudly (loud enough to be heard through closed doors)?  1  Do you often feel tired, fatigued, or sleepy during the daytime? 0  Has anyone observed you stop breathing during your sleep? 0  Do you have, or are you being treated for high blood pressure? 1  BMI more than 35 kg/m2? 1  Age over 62 years old? 1  Neck circumference greater than 40 cm/18 inches? 0  Gender: 0  Obstructive Sleep Apnea Score 4  Score 4 or greater  Results sent to PCP

## 2013-06-28 NOTE — Progress Notes (Signed)
Anesthesia Chart Review:  Patient is a 62 year old female scheduled for right VATS, lung biopsy for ILD tomorrow by Dr. Dorris Fetch. History noted.  She is a non-smoker. PCP is Dr. Lorie Phenix. Pulmonologist is Dr. Max Fickle.  EKG on 06/28/13 showed NSR, cannot rule out anterior infarct (age undetermined). Currently, no comparison EKG is available.  Today's CXR report is still pending. Chest CT on 04/22/13 showed: 1. Pulmonary parenchymal pattern of subpleural fibrosis is unchanged from 11/25/2012 and may be due to nonspecific interstitial pneumonitis (NSIP). Usual interstitial pneumonitis is not excluded. 2. Small pulmonary nodules are unchanged from 11/25/2012. Continued observation is recommended. Typically, for nodules of this size in a patient who is at increased risk for bronchogenic carcinoma, 1 year of documented stability is favored.   PFTs on 05/11/13 showed FVC 2.07 (59%), FEV1 1.92 (71%), DLCO 15.94 (59%). Pulmonary function testing showed moderate restriction with a portion of reduction and her DLCO. There was no air flow obstruction.  Preoperative labs noted.  No chest pain symptoms reported at her PAT visit.  No known MI/CHF history. She does have DM2.  She will be evaluated by her assigned anesthesiologist on the day of surgery.  If no acute changes or new CV symptoms then I would anticipate that she could proceed as planned.  Velna Ochs [facility] Short Stay Center/Anesthesiology Phone [phone] 06/28/2013 5:04 PM

## 2013-06-28 NOTE — Pre-Procedure Instructions (Signed)
Kaitlin Hamilton  06/28/2013   Your procedure is scheduled on:  Wed, Dec 10 @ 9:30 AM  Report to [facility] Short Stay Entrance A at 7:30 AM.  Call this number if you have problems the morning of surgery: [phone]   Remember:   Do not eat food or drink liquids after midnight.   Take these medicines the morning of surgery with A SIP OF WATER: Fluticasone(Flonase),Levaquin(Levofloxacin),Synthroid(Levothyroxine),Protonix(Pantoprazole),and Prednisone(Deltasone)              No Goody's,BC's,Aleve,Ibuprofen,Fish Oil,or any Herbal Medications   Do not wear jewelry, make-up or nail polish.  Do not wear lotions, powders, or perfumes. You may wear deodorant.  Do not shave 48 hours prior to surgery.   Do not bring valuables to the hospital.  [facility] is not responsible                  for any belongings or valuables.               Contacts, dentures or bridgework may not be worn into surgery.  Leave suitcase in the car. After surgery it may be brought to your room.  For patients admitted to the hospital, discharge time is determined by your                treatment team.              Special Instructions: Shower using CHG 2 nights before surgery and the night before surgery.  If you shower the day of surgery use CHG.  Use special wash - you have one bottle of CHG for all showers.  You should use approximately 1/3 of the bottle for each shower.   Please read over the following fact sheets that you were given: Pain Booklet, Coughing and Deep Breathing, Blood Transfusion Information, MRSA Information and Surgical Site Infection Prevention

## 2013-06-29 ENCOUNTER — Encounter (HOSPITAL_COMMUNITY)
Admission: RE | Disposition: A | Discharge status: Home / Self Care | Payer: Self-pay | Source: Ambulatory Visit | Attending: Thoracic Surgery (Cardiothoracic Vascular Surgery)

## 2013-06-29 ENCOUNTER — Encounter (HOSPITAL_COMMUNITY): Payer: Self-pay | Admitting: Surgery

## 2013-06-29 ENCOUNTER — Encounter (HOSPITAL_COMMUNITY): Payer: BC Managed Care – PPO | Admitting: Vascular Surgery

## 2013-06-29 ENCOUNTER — Inpatient Hospital Stay (HOSPITAL_COMMUNITY): Payer: BC Managed Care – PPO | Admitting: Anesthesiology

## 2013-06-29 ENCOUNTER — Inpatient Hospital Stay (HOSPITAL_COMMUNITY)
Admission: RE | Admit: 2013-06-29 | Discharge: 2013-07-04 | DRG: 168 | Disposition: A | Discharge status: Home / Self Care | Payer: BC Managed Care – PPO | Source: Ambulatory Visit | Attending: Thoracic Surgery (Cardiothoracic Vascular Surgery) | Admitting: Thoracic Surgery (Cardiothoracic Vascular Surgery)

## 2013-06-29 ENCOUNTER — Inpatient Hospital Stay (HOSPITAL_COMMUNITY): Payer: BC Managed Care – PPO

## 2013-06-29 DIAGNOSIS — E785 Hyperlipidemia, unspecified: Secondary | ICD-10-CM | POA: Diagnosis present

## 2013-06-29 DIAGNOSIS — Z79899 Other long term (current) drug therapy: Secondary | ICD-10-CM

## 2013-06-29 DIAGNOSIS — Z886 Allergy status to analgesic agent status: Secondary | ICD-10-CM

## 2013-06-29 DIAGNOSIS — T40605A Adverse effect of unspecified narcotics, initial encounter: Secondary | ICD-10-CM | POA: Diagnosis not present

## 2013-06-29 DIAGNOSIS — K5900 Constipation, unspecified: Secondary | ICD-10-CM | POA: Diagnosis not present

## 2013-06-29 DIAGNOSIS — J45909 Unspecified asthma, uncomplicated: Secondary | ICD-10-CM | POA: Diagnosis present

## 2013-06-29 DIAGNOSIS — Z01818 Encounter for other preprocedural examination: Secondary | ICD-10-CM

## 2013-06-29 DIAGNOSIS — K219 Gastro-esophageal reflux disease without esophagitis: Secondary | ICD-10-CM | POA: Diagnosis present

## 2013-06-29 DIAGNOSIS — M1990 Unspecified osteoarthritis, unspecified site: Secondary | ICD-10-CM | POA: Diagnosis present

## 2013-06-29 DIAGNOSIS — J8410 Pulmonary fibrosis, unspecified: Secondary | ICD-10-CM

## 2013-06-29 DIAGNOSIS — E039 Hypothyroidism, unspecified: Secondary | ICD-10-CM | POA: Diagnosis present

## 2013-06-29 DIAGNOSIS — J849 Interstitial pulmonary disease, unspecified: Secondary | ICD-10-CM | POA: Diagnosis present

## 2013-06-29 DIAGNOSIS — Z8249 Family history of ischemic heart disease and other diseases of the circulatory system: Secondary | ICD-10-CM

## 2013-06-29 DIAGNOSIS — I10 Essential (primary) hypertension: Secondary | ICD-10-CM | POA: Diagnosis present

## 2013-06-29 DIAGNOSIS — T380X5A Adverse effect of glucocorticoids and synthetic analogues, initial encounter: Secondary | ICD-10-CM | POA: Diagnosis not present

## 2013-06-29 DIAGNOSIS — Z6839 Body mass index (BMI) 39.0-39.9, adult: Secondary | ICD-10-CM

## 2013-06-29 DIAGNOSIS — E119 Type 2 diabetes mellitus without complications: Secondary | ICD-10-CM | POA: Diagnosis present

## 2013-06-29 DIAGNOSIS — Z01812 Encounter for preprocedural laboratory examination: Secondary | ICD-10-CM

## 2013-06-29 DIAGNOSIS — L299 Pruritus, unspecified: Secondary | ICD-10-CM | POA: Diagnosis not present

## 2013-06-29 DIAGNOSIS — K224 Dyskinesia of esophagus: Secondary | ICD-10-CM | POA: Diagnosis present

## 2013-06-29 HISTORY — PX: LUNG BIOPSY: SHX5088

## 2013-06-29 HISTORY — PX: VIDEO ASSISTED THORACOSCOPY: SHX5073

## 2013-06-29 LAB — GLUCOSE, CAPILLARY
Glucose-Capillary: 201 mg/dL — ABNORMAL HIGH (ref 70–99)
Glucose-Capillary: 184 mg/dL — ABNORMAL HIGH (ref 70–99)
Glucose-Capillary: 272 mg/dL — ABNORMAL HIGH (ref 70–99)

## 2013-06-29 SURGERY — VIDEO ASSISTED THORACOSCOPY
Anesthesia: General | Site: Chest | Laterality: Right

## 2013-06-29 MED ORDER — PANTOPRAZOLE SODIUM 40 MG PO TBEC
40.0000 mg | DELAYED_RELEASE_TABLET | Freq: Every day | ORAL | Status: DC
  Administered 2013-06-30 – 2013-07-04 (×5): 40 mg via ORAL
  Filled 2013-06-29 (×5): qty 1

## 2013-06-29 MED ORDER — OXYCODONE HCL 5 MG PO TABS: 5.0000 mg | ORAL_TABLET | Freq: Once | ORAL | Status: DC | PRN

## 2013-06-29 MED ORDER — DIPHENHYDRAMINE HCL 12.5 MG/5ML PO ELIX
12.5000 mg | ORAL_SOLUTION | Freq: Four times a day (QID) | ORAL | Status: DC | PRN
  Administered 2013-06-30: 12.5 mg via ORAL
  Filled 2013-06-29: qty 5

## 2013-06-29 MED ORDER — ACETAMINOPHEN 160 MG/5ML PO SOLN
1000.0000 mg | Freq: Four times a day (QID) | ORAL | Status: AC
  Administered 2013-06-29 – 2013-06-30 (×2): 1000 mg via ORAL
  Filled 2013-06-29 (×2): qty 40
  Filled 2013-06-29: qty 40.6
  Filled 2013-06-29: qty 40

## 2013-06-29 MED ORDER — DIPHENHYDRAMINE HCL 50 MG/ML IJ SOLN
12.5000 mg | Freq: Four times a day (QID) | INTRAMUSCULAR | Status: DC | PRN
  Administered 2013-06-30: 12.5 mg via INTRAVENOUS
  Filled 2013-06-29: qty 1

## 2013-06-29 MED ORDER — DEXTROSE 5 % IV SOLN
1.5000 g | Freq: Two times a day (BID) | INTRAVENOUS | Status: AC
  Administered 2013-06-29 – 2013-06-30 (×2): 1.5 g via INTRAVENOUS
  Filled 2013-06-29 (×2): qty 1.5

## 2013-06-29 MED ORDER — VERAPAMIL HCL ER 180 MG PO CP24: 180.0000 mg | ORAL_CAPSULE | Freq: Every day | ORAL | Status: DC

## 2013-06-29 MED ORDER — HYDROCORTISONE SOD SUCCINATE 100 MG IJ SOLR: 100.0000 mg | Freq: Three times a day (TID) | INTRAMUSCULAR | Status: DC

## 2013-06-29 MED ORDER — ACETAMINOPHEN 500 MG PO TABS
1000.0000 mg | ORAL_TABLET | Freq: Four times a day (QID) | ORAL | Status: AC
  Administered 2013-06-29: 975 mg via ORAL
  Administered 2013-06-30: 1000 mg via ORAL
  Filled 2013-06-29: qty 2

## 2013-06-29 MED ORDER — MIDAZOLAM HCL 5 MG/5ML IJ SOLN
INTRAMUSCULAR | Status: DC | PRN
  Administered 2013-06-29: 2 mg via INTRAVENOUS

## 2013-06-29 MED ORDER — ROCURONIUM BROMIDE 100 MG/10ML IV SOLN
INTRAVENOUS | Status: DC | PRN
  Administered 2013-06-29: 50 mg via INTRAVENOUS

## 2013-06-29 MED ORDER — ALBUTEROL SULFATE HFA 108 (90 BASE) MCG/ACT IN AERS
4.0000 | INHALATION_SPRAY | Freq: Four times a day (QID) | RESPIRATORY_TRACT | Status: DC
  Filled 2013-06-29: qty 6.7

## 2013-06-29 MED ORDER — PROPOFOL 10 MG/ML IV BOLUS
INTRAVENOUS | Status: DC | PRN
  Administered 2013-06-29: 200 mg via INTRAVENOUS

## 2013-06-29 MED ORDER — LACTATED RINGERS IV SOLN
INTRAVENOUS | Status: DC
  Administered 2013-06-29: 08:00:00 via INTRAVENOUS

## 2013-06-29 MED ORDER — PREDNISONE 20 MG PO TABS
40.0000 mg | ORAL_TABLET | Freq: Every day | ORAL | Status: DC
  Administered 2013-07-01 – 2013-07-04 (×4): 40 mg via ORAL
  Filled 2013-06-29 (×5): qty 2

## 2013-06-29 MED ORDER — MONTELUKAST SODIUM 10 MG PO TABS
10.0000 mg | ORAL_TABLET | Freq: Every day | ORAL | Status: DC
  Administered 2013-06-30 – 2013-07-03 (×4): 10 mg via ORAL
  Filled 2013-06-29 (×5): qty 1

## 2013-06-29 MED ORDER — FLUTICASONE PROPIONATE 50 MCG/ACT NA SUSP
2.0000 | Freq: Every day | NASAL | Status: DC
  Administered 2013-06-30 – 2013-07-04 (×5): 2 via NASAL
  Filled 2013-06-29: qty 16

## 2013-06-29 MED ORDER — LEVOTHYROXINE SODIUM 125 MCG PO TABS
125.0000 ug | ORAL_TABLET | Freq: Every day | ORAL | Status: DC
  Administered 2013-06-30 – 2013-07-04 (×5): 125 ug via ORAL
  Filled 2013-06-29 (×7): qty 1

## 2013-06-29 MED ORDER — 0.9 % SODIUM CHLORIDE (POUR BTL) OPTIME
TOPICAL | Status: DC | PRN
  Administered 2013-06-29: 1000 mL

## 2013-06-29 MED ORDER — SENNOSIDES-DOCUSATE SODIUM 8.6-50 MG PO TABS
1.0000 | ORAL_TABLET | Freq: Every evening | ORAL | Status: DC | PRN
  Filled 2013-06-29: qty 1

## 2013-06-29 MED ORDER — ONDANSETRON HCL 4 MG/2ML IJ SOLN: 4.0000 mg | Freq: Four times a day (QID) | INTRAMUSCULAR | Status: DC | PRN

## 2013-06-29 MED ORDER — VERAPAMIL HCL ER 180 MG PO TBCR
180.0000 mg | EXTENDED_RELEASE_TABLET | Freq: Every day | ORAL | Status: DC
  Administered 2013-06-30 – 2013-07-04 (×5): 180 mg via ORAL
  Filled 2013-06-29 (×5): qty 1

## 2013-06-29 MED ORDER — GLIPIZIDE 5 MG PO TABS
5.0000 mg | ORAL_TABLET | Freq: Every day | ORAL | Status: DC
  Administered 2013-06-30 – 2013-07-04 (×5): 5 mg via ORAL
  Filled 2013-06-29 (×6): qty 1

## 2013-06-29 MED ORDER — NALOXONE HCL 0.4 MG/ML IJ SOLN: 0.4000 mg | INTRAMUSCULAR | Status: DC | PRN

## 2013-06-29 MED ORDER — HYDROCOD POLST-CHLORPHEN POLST 10-8 MG/5ML PO LQCR
5.0000 mL | Freq: Two times a day (BID) | ORAL | Status: DC | PRN
  Administered 2013-06-29 – 2013-07-01 (×4): 5 mL via ORAL
  Filled 2013-06-29 (×4): qty 5

## 2013-06-29 MED ORDER — HYDROCORTISONE SOD SUCCINATE 100 MG IJ SOLR
100.0000 mg | Freq: Three times a day (TID) | INTRAMUSCULAR | Status: AC
  Administered 2013-06-29 – 2013-06-30 (×3): 100 mg via INTRAVENOUS
  Filled 2013-06-29 (×3): qty 2

## 2013-06-29 MED ORDER — HEMOSTATIC AGENTS (NO CHARGE) OPTIME
TOPICAL | Status: DC | PRN
  Administered 2013-06-29: 1 via TOPICAL

## 2013-06-29 MED ORDER — HYDROCORTISONE SOD SUCCINATE 100 MG IJ SOLR
50.0000 mg | Freq: Three times a day (TID) | INTRAMUSCULAR | Status: AC
  Administered 2013-06-30 – 2013-07-01 (×3): 50 mg via INTRAVENOUS
  Filled 2013-06-29 (×3): qty 1

## 2013-06-29 MED ORDER — GLYCOPYRROLATE 0.2 MG/ML IJ SOLN
INTRAMUSCULAR | Status: DC | PRN
  Administered 2013-06-29: 0.4 mg via INTRAVENOUS

## 2013-06-29 MED ORDER — OXYCODONE HCL 5 MG PO TABS
5.0000 mg | ORAL_TABLET | ORAL | Status: AC | PRN
  Administered 2013-06-30: 10 mg via ORAL
  Filled 2013-06-29: qty 2

## 2013-06-29 MED ORDER — ONDANSETRON HCL 4 MG/2ML IJ SOLN
INTRAMUSCULAR | Status: DC | PRN
  Administered 2013-06-29: 4 mg via INTRAVENOUS

## 2013-06-29 MED ORDER — POTASSIUM CHLORIDE 10 MEQ/50ML IV SOLN: 10.0000 meq | Freq: Every day | INTRAVENOUS | Status: DC | PRN

## 2013-06-29 MED ORDER — HYDROMORPHONE HCL PF 1 MG/ML IJ SOLN
INTRAMUSCULAR | Status: AC
  Filled 2013-06-29: qty 2

## 2013-06-29 MED ORDER — TRAMADOL HCL 50 MG PO TABS
50.0000 mg | ORAL_TABLET | Freq: Four times a day (QID) | ORAL | Status: DC | PRN
  Administered 2013-07-01 (×2): 100 mg via ORAL
  Filled 2013-06-29 (×2): qty 2

## 2013-06-29 MED ORDER — LIDOCAINE HCL (CARDIAC) 20 MG/ML IV SOLN
INTRAVENOUS | Status: DC | PRN
  Administered 2013-06-29: 60 mg via INTRAVENOUS

## 2013-06-29 MED ORDER — HYDROMORPHONE HCL PF 1 MG/ML IJ SOLN
0.2500 mg | INTRAMUSCULAR | Status: DC | PRN
  Administered 2013-06-29 (×4): 0.5 mg via INTRAVENOUS

## 2013-06-29 MED ORDER — SODIUM CHLORIDE 0.9 % IJ SOLN: 9.0000 mL | INTRAMUSCULAR | Status: DC | PRN

## 2013-06-29 MED ORDER — FENTANYL CITRATE 0.05 MG/ML IJ SOLN: 50.0000 ug | Freq: Once | INTRAMUSCULAR | Status: DC

## 2013-06-29 MED ORDER — MIDAZOLAM HCL 2 MG/2ML IJ SOLN: 1.0000 mg | INTRAMUSCULAR | Status: DC | PRN

## 2013-06-29 MED ORDER — HYDROCHLOROTHIAZIDE 12.5 MG PO CAPS
12.5000 mg | ORAL_CAPSULE | Freq: Every day | ORAL | Status: DC
  Administered 2013-06-30 – 2013-07-04 (×5): 12.5 mg via ORAL
  Filled 2013-06-29 (×5): qty 1

## 2013-06-29 MED ORDER — OXYCODONE HCL 5 MG/5ML PO SOLN: 5.0000 mg | Freq: Once | ORAL | Status: DC | PRN

## 2013-06-29 MED ORDER — KCL IN DEXTROSE-NACL 20-5-0.45 MEQ/L-%-% IV SOLN
INTRAVENOUS | Status: DC
  Administered 2013-06-29: via INTRAVENOUS
  Filled 2013-06-29 (×3): qty 1000

## 2013-06-29 MED ORDER — PROMETHAZINE HCL 25 MG/ML IJ SOLN: 6.2500 mg | INTRAMUSCULAR | Status: DC | PRN

## 2013-06-29 MED ORDER — FENTANYL CITRATE 0.05 MG/ML IJ SOLN
INTRAMUSCULAR | Status: DC | PRN
  Administered 2013-06-29 (×3): 50 ug via INTRAVENOUS
  Administered 2013-06-29: 100 ug via INTRAVENOUS
  Administered 2013-06-29: 50 ug via INTRAVENOUS
  Administered 2013-06-29 (×2): 100 ug via INTRAVENOUS

## 2013-06-29 MED ORDER — HYDROCORTISONE SOD SUCCINATE 100 MG IJ SOLR
100.0000 mg | Freq: Three times a day (TID) | INTRAMUSCULAR | Status: DC
  Filled 2013-06-29 (×2): qty 2

## 2013-06-29 MED ORDER — FENTANYL 10 MCG/ML IV SOLN
INTRAVENOUS | Status: DC
  Administered 2013-06-29: 14:00:00 via INTRAVENOUS
  Administered 2013-06-29: 171.9 ug via INTRAVENOUS
  Administered 2013-06-29: 51.98 ug via INTRAVENOUS
  Administered 2013-06-30: 30 ug via INTRAVENOUS
  Administered 2013-06-30: 140 ug via INTRAVENOUS
  Administered 2013-06-30: 170 ug via INTRAVENOUS
  Administered 2013-06-30: 210 ug via INTRAVENOUS
  Administered 2013-06-30: 260 ug via INTRAVENOUS
  Administered 2013-06-30: 130 ug via INTRAVENOUS
  Administered 2013-07-01: 10 ug via INTRAVENOUS
  Administered 2013-07-01: 100 ug via INTRAVENOUS
  Filled 2013-06-29 (×5): qty 50

## 2013-06-29 MED ORDER — KCL IN DEXTROSE-NACL 20-5-0.45 MEQ/L-%-% IV SOLN
INTRAVENOUS | Status: AC
  Filled 2013-06-29: qty 1000

## 2013-06-29 MED ORDER — ONDANSETRON HCL 4 MG/2ML IJ SOLN
4.0000 mg | Freq: Four times a day (QID) | INTRAMUSCULAR | Status: DC | PRN
  Administered 2013-06-29: 4 mg via INTRAVENOUS
  Filled 2013-06-29: qty 2

## 2013-06-29 MED ORDER — BISACODYL 5 MG PO TBEC
10.0000 mg | DELAYED_RELEASE_TABLET | Freq: Every day | ORAL | Status: DC
  Administered 2013-06-30 – 2013-07-03 (×4): 10 mg via ORAL
  Filled 2013-06-29 (×5): qty 2

## 2013-06-29 MED ORDER — HYDROCORTISONE SOD SUCCINATE 100 MG IJ SOLR
50.0000 mg | Freq: Three times a day (TID) | INTRAMUSCULAR | Status: DC
  Filled 2013-06-29 (×2): qty 1

## 2013-06-29 MED ORDER — INSULIN ASPART 100 UNIT/ML ~~LOC~~ SOLN
0.0000 [IU] | Freq: Four times a day (QID) | SUBCUTANEOUS | Status: DC
  Administered 2013-06-29: 8 [IU] via SUBCUTANEOUS
  Administered 2013-06-29: 12 [IU] via SUBCUTANEOUS
  Administered 2013-06-30: 8 [IU] via SUBCUTANEOUS

## 2013-06-29 MED ORDER — NEOSTIGMINE METHYLSULFATE 1 MG/ML IJ SOLN
INTRAMUSCULAR | Status: DC | PRN
  Administered 2013-06-29: 3 mg via INTRAVENOUS

## 2013-06-29 MED ORDER — OXYCODONE-ACETAMINOPHEN 5-325 MG PO TABS: 1.0000 | ORAL_TABLET | ORAL | Status: DC | PRN

## 2013-06-29 SURGICAL SUPPLY — 65 items
APPLIER CLIP ROT 10 11.4 M/L (STAPLE)
CANISTER SUCTION 2500CC (MISCELLANEOUS) ×3 IMPLANT
CATH KIT ON Q 5IN SLV (PAIN MANAGEMENT) IMPLANT
CATH THORACIC 28FR (CATHETERS) ×3 IMPLANT
CATH THORACIC 28FR RT ANG (CATHETERS) IMPLANT
CATH THORACIC 36FR (CATHETERS) IMPLANT
CATH THORACIC 36FR RT ANG (CATHETERS) IMPLANT
CLIP APPLIE ROT 10 11.4 M/L (STAPLE) IMPLANT
CLIP TI MEDIUM 6 (CLIP) IMPLANT
CONN Y 3/8X3/8X3/8  BEN (MISCELLANEOUS) ×1
CONN Y 3/8X3/8X3/8 BEN (MISCELLANEOUS) ×2 IMPLANT
CONT SPEC 4OZ CLIKSEAL STRL BL (MISCELLANEOUS) ×9 IMPLANT
DRAIN CHANNEL 32F RND 10.7 FF (WOUND CARE) IMPLANT
DRAPE LAPAROSCOPIC ABDOMINAL (DRAPES) ×3 IMPLANT
DRAPE WARM FLUID 44X44 (DRAPE) ×3 IMPLANT
ELECT REM PT RETURN 9FT ADLT (ELECTROSURGICAL) ×3
ELECTRODE REM PT RTRN 9FT ADLT (ELECTROSURGICAL) ×2 IMPLANT
GLOVE SURG SIGNA 7.5 PF LTX (GLOVE) ×6 IMPLANT
GOWN PREVENTION PLUS XLARGE (GOWN DISPOSABLE) ×3 IMPLANT
GOWN STRL NON-REIN LRG LVL3 (GOWN DISPOSABLE) ×6 IMPLANT
HANDLE STAPLE ENDO GIA SHORT (STAPLE) ×1
HEMOSTAT SURGICEL 2X14 (HEMOSTASIS) IMPLANT
KIT BASIN OR (CUSTOM PROCEDURE TRAY) ×3 IMPLANT
KIT ROOM TURNOVER OR (KITS) ×3 IMPLANT
KIT SUCTION CATH 14FR (SUCTIONS) ×3 IMPLANT
NS IRRIG 1000ML POUR BTL (IV SOLUTION) ×6 IMPLANT
PACK CHEST (CUSTOM PROCEDURE TRAY) ×3 IMPLANT
PAD ARMBOARD 7.5X6 YLW CONV (MISCELLANEOUS) ×6 IMPLANT
POUCH ENDO CATCH II 15MM (MISCELLANEOUS) IMPLANT
POUCH SPECIMEN RETRIEVAL 10MM (ENDOMECHANICALS) IMPLANT
RELOAD EGIA 45 MED/THCK PURPLE (STAPLE) ×30 IMPLANT
SEALANT PROGEL (MISCELLANEOUS) ×3 IMPLANT
SEALANT SURG COSEAL 4ML (VASCULAR PRODUCTS) IMPLANT
SEALANT SURG COSEAL 8ML (VASCULAR PRODUCTS) IMPLANT
SOLUTION ANTI FOG 6CC (MISCELLANEOUS) ×3 IMPLANT
SPECIMEN JAR MEDIUM (MISCELLANEOUS) ×3 IMPLANT
SPONGE GAUZE 4X4 12PLY (GAUZE/BANDAGES/DRESSINGS) ×3 IMPLANT
SPONGE INTESTINAL PEANUT (DISPOSABLE) IMPLANT
STAPLER ENDO GIA 12MM SHORT (STAPLE) ×2 IMPLANT
SUT PROLENE 4 0 RB 1 (SUTURE)
SUT PROLENE 4-0 RB1 .5 CRCL 36 (SUTURE) IMPLANT
SUT SILK  1 MH (SUTURE) ×2
SUT SILK 1 MH (SUTURE) ×4 IMPLANT
SUT SILK 2 0SH CR/8 30 (SUTURE) IMPLANT
SUT SILK 3 0SH CR/8 30 (SUTURE) IMPLANT
SUT VIC AB 1 CTX 36 (SUTURE) ×1
SUT VIC AB 1 CTX36XBRD ANBCTR (SUTURE) ×2 IMPLANT
SUT VIC AB 2-0 CTX 36 (SUTURE) ×3 IMPLANT
SUT VIC AB 2-0 UR6 27 (SUTURE) IMPLANT
SUT VIC AB 3-0 MH 27 (SUTURE) ×3 IMPLANT
SUT VIC AB 3-0 X1 27 (SUTURE) IMPLANT
SUT VICRYL 2 TP 1 (SUTURE) IMPLANT
SWAB COLLECTION DEVICE MRSA (MISCELLANEOUS) IMPLANT
SYSTEM SAHARA CHEST DRAIN ATS (WOUND CARE) ×3 IMPLANT
TAPE CLOTH 4X10 WHT NS (GAUZE/BANDAGES/DRESSINGS) ×3 IMPLANT
TAPE CLOTH SURG 4X10 WHT LF (GAUZE/BANDAGES/DRESSINGS) ×3 IMPLANT
TIP APPLICATOR SPRAY EXTEND 16 (VASCULAR PRODUCTS) IMPLANT
TOWEL OR 17X24 6PK STRL BLUE (TOWEL DISPOSABLE) ×3 IMPLANT
TOWEL OR 17X26 10 PK STRL BLUE (TOWEL DISPOSABLE) ×6 IMPLANT
TRAP SPECIMEN MUCOUS 40CC (MISCELLANEOUS) IMPLANT
TRAY FOLEY CATH 14FRSI W/METER (CATHETERS) ×3 IMPLANT
TROCAR BLADELESS 5MM (ENDOMECHANICALS) ×3 IMPLANT
TUBE ANAEROBIC SPECIMEN COL (MISCELLANEOUS) IMPLANT
TUNNELER SHEATH ON-Q 11GX8 DSP (PAIN MANAGEMENT) IMPLANT
WATER STERILE IRR 1000ML POUR (IV SOLUTION) ×6 IMPLANT

## 2013-06-29 NOTE — Interval H&P Note (Signed)
History and Physical Interval Note:  06/29/2013 10:58 AM  Kaitlin Hamilton  has presented today for surgery, with the diagnosis of ILD  The various methods of treatment have been discussed with the patient and family. After consideration of risks, benefits and other options for treatment, the patient has consented to  Procedure(s): VIDEO ASSISTED THORACOSCOPY (Right) LUNG BIOPSY (Right) as a surgical intervention .  The patient's history has been reviewed, patient examined, no change in status, stable for surgery.  I have reviewed the patient's chart and labs.  Questions were answered to the patient's satisfaction.     [name]

## 2013-06-29 NOTE — Anesthesia Postprocedure Evaluation (Signed)
  Anesthesia Post-op Note  Patient: Kaitlin Hamilton  Procedure(s) Performed: Procedure(s): VIDEO ASSISTED THORACOSCOPY (Right) LUNG BIOPSY (Right)  Patient Location: PACU  Anesthesia Type:General  Level of Consciousness: awake  Airway and Oxygen Therapy: Patient Spontanous Breathing  Post-op Pain: mild  Post-op Assessment: Post-op Vital signs reviewed, Patient's Cardiovascular Status Stable, Respiratory Function Stable, Patent Airway, No signs of Nausea or vomiting and Pain level controlled  Post-op Vital Signs: Reviewed and stable  Complications: No apparent anesthesia complications

## 2013-06-29 NOTE — Brief Op Note (Addendum)
  06/29/2013  12:33 PM  PATIENT:  Tharon Aquas  62 y.o. female  PRE-OPERATIVE DIAGNOSIS:  Interstitial lung disease  POST-OPERATIVE DIAGNOSIS:  Interstitial lung disease  PROCEDURE:   RIGHT VIDEO ASSISTED THORACOSCOPY BIOPSIES OF RIGHT UPPER, MIDDLE, AND LOWER LOBES  SURGEON:  Loreli Slot, MD  ASSISTANT: Coral Ceo, PA-C  ANESTHESIA:   general  SPECIMEN:  Source of Specimen:  RUL, RML, RLL  DISPOSITION OF SPECIMEN:  Pathology  DRAINS: 28 Fr CT  PATIENT CONDITION:  PACU - hemodynamically stable.  Frozen of RML biopsy showed pulmonary fibrotic process- no definitive diagnosis possible on frozen

## 2013-06-29 NOTE — Progress Notes (Signed)
Pt arrived from PACU, c/o of pain despite PCA. Discussed Hx of PONV, but Pt agreed to try APAP to augment pain control. Pt had N/V post admin. Admin zofran. Will continue to monitor.

## 2013-06-29 NOTE — Transfer of Care (Signed)
Immediate Anesthesia Transfer of Care Note  Patient: Kaitlin Hamilton  Procedure(s) Performed: Procedure(s): VIDEO ASSISTED THORACOSCOPY (Right) LUNG BIOPSY (Right)  Patient Location: PACU  Anesthesia Type:General  Level of Consciousness: awake, alert  and oriented  Airway & Oxygen Therapy: Patient Spontanous Breathing and Patient connected to face mask oxygen  Post-op Assessment: Report given to PACU RN, Post -op Vital signs reviewed and stable and Post -op Vital signs reviewed and unstable, Anesthesiologist notified  Post vital signs: Reviewed and stable  Complications: No apparent anesthesia complications

## 2013-06-29 NOTE — Anesthesia Preprocedure Evaluation (Signed)
Anesthesia Evaluation    Airway Mallampati: I TM Distance: >3 FB Neck ROM: Full    Dental   Pulmonary shortness of breath, resolved, COPDRecent URI , Residual Cough,  + rhonchi   - wheezing      Cardiovascular hypertension, Rhythm:Regular Rate:Normal     Neuro/Psych  Headaches, Anxiety Depression    GI/Hepatic hiatal hernia, GERD-  ,  Endo/Other  diabetesMorbid obesity  Renal/GU      Musculoskeletal   Abdominal (+) + obese,   Peds  Hematology   Anesthesia Other Findings   Reproductive/Obstetrics                           Anesthesia Physical Anesthesia Plan  ASA: III  Anesthesia Plan: General   Post-op Pain Management:    Induction: Intravenous  Airway Management Planned: Double Lumen EBT  Additional Equipment: Arterial line  Intra-op Plan:   Post-operative Plan: Extubation in OR  Informed Consent: I have reviewed the patients History and Physical, chart, labs and discussed the procedure including the risks, benefits and alternatives for the proposed anesthesia with the patient or authorized representative who has indicated his/her understanding and acceptance.     Plan Discussed with: CRNA and Surgeon  Anesthesia Plan Comments:         Anesthesia Quick Evaluation

## 2013-06-29 NOTE — H&P (View-Only) (Signed)
PCP is Lorie Phenix, MD Referring Provider is Lorie Phenix, MD/ Heber [place], MD  Chief Complaint  Patient presents with  . Interstitial Lung Disease    Surgical eval for possible BX, Chest CT 04/26/13     HPI: Kaitlin Hamilton presents for evaluation for possible thoracoscopic lung biopsy.  Kaitlin Hamilton is a 62 year old woman with interstitial lung disease. She first started having problems a couple of years ago. Her breathing would get worse in the winter. She says that she would catch and pulled metabolic moved down into her chest and inability persistent over a long period of time. She would have coughing, occasionally productive. She also would have wheezing and shortness of breath with exertion. Symptoms tended to diminish in the summer. Last winter she had multiple episodes and was treated with multiple rounds of antibiotics. She was noted to have crackles on exam and an abnormal PFT and was referred to Dr. Kendrick Fries. Pulmonary function testing showed moderate restriction with a portion of reduction and her DLCO. There was no air flow obstruction. She was treated with steroids and had improvement in her symptoms.  She was having worsening symptoms again in October. She saw Dr. Kendrick Fries at that time. A CT showed interstitial lung disease. The possibility of a lung biopsy was discussed, but she was started on empiric prednisone. She had an immediate response with improvement of her symptoms. However she noted a 15 pound weight gain over a 6 week period and is concerned about staying on prednisone long-term. After further discussion with Dr. Kendrick Fries she decided she would agree to a lung biopsy to establish a diagnosis and guide therapy.  Dr. Kendrick Fries wanted her off of steroids prior to the lung biopsy. Interestingly as soon as the steroid dose was reduced steroids her symptoms recurred.  She also has reflux and did have endoscopy with biopsy recently which showed H. pylori.     Past Medical History   Diagnosis Date  . Asthma   . Bronchitis   . Chicken pox   . Depression   . Migraines   . OA (osteoarthritis)   . Hypothyroidism (acquired)   . Allergic rhinitis   . Non-insulin dependent type 2 diabetes mellitus   . Hyperlipidemia   . Hypokalemia   . Anxiety   . GERD (gastroesophageal reflux disease)     Past Surgical History  Procedure Laterality Date  . Tubal ligation    . Cholecystectomy    . Appendectomy    . Abdominal exploration surgery      For Ovarian Cyst     Family History  Problem Relation Age of Onset  . Rheum arthritis Mother   . Rheum arthritis Sister   . Prostate cancer Brother   . Heart disease Maternal Grandmother   . Heart disease Maternal Grandfather   . Uterine cancer Daughter   . Colon cancer Neg Hx     Social History History  Substance Use Topics  . Smoking status: Never Smoker   . Smokeless tobacco: Never Used  . Alcohol Use: No    Current Outpatient Prescriptions  Medication Sig Dispense Refill  . fluticasone (FLONASE) 50 MCG/ACT nasal spray Place 1 spray into the nose 2 (two) times daily.      Marland Kitchen glipiZIDE (GLUCOTROL) 5 MG tablet Take 5 mg by mouth daily.      . hydrochlorothiazide (MICROZIDE) 12.5 MG capsule Take 12.5 mg by mouth daily.      . Hypertonic Nasal Wash (SINUS RINSE BOTTLE KIT NA) As directed  as needed      . levothyroxine (SYNTHROID, LEVOTHROID) 150 MCG tablet Take 150 mcg by mouth daily.      . montelukast (SINGULAIR) 10 MG tablet Take 10 mg by mouth at bedtime.      . pantoprazole (PROTONIX) 40 MG tablet Take 40 mg by mouth daily.      . verapamil (VERELAN PM) 180 MG 24 hr capsule Take 180 mg by mouth daily.       No current facility-administered medications for this visit.    Allergies  Allergen Reactions  . Codeine Hives and Swelling  . Demerol [Meperidine] Hives and Swelling    Review of Systems  Constitutional: Positive for appetite change (increased) and unexpected weight change (15 pound weight gain on  prednisone).  Respiratory: Positive for cough, shortness of breath and wheezing ("a little").   Cardiovascular: Negative for chest pain.  Gastrointestinal:       Hiatal hernia/reflux  Genitourinary:       Kidney stones  Musculoskeletal:       Leg cramps  Neurological: Positive for headaches.  Psychiatric/Behavioral: The patient is nervous/anxious (due to prednisone).   All other systems reviewed and are negative.    BP 159/92  Pulse 79  Resp 20  Ht 5\' 6"  (1.676 m)  Wt 241 lb (109.317 kg)  BMI 38.92 kg/m2  SpO2 96% Physical Exam  Vitals reviewed. Constitutional: She is oriented to person, place, and time. No distress.  Morbidly obese  HENT:  Head: Normocephalic and atraumatic.  Eyes: EOM are normal. Pupils are equal, round, and reactive to light.  Neck: Neck supple. No thyromegaly present.  Cardiovascular: Normal rate and regular rhythm.   Murmur (2/6 systolic) heard. Pulmonary/Chest: Effort normal. No respiratory distress. She has no wheezes. She has rales.  Abdominal: Soft. There is no tenderness.  Musculoskeletal: She exhibits edema.  Lymphadenopathy:    She has no cervical adenopathy.  Neurological: She is alert and oriented to person, place, and time. No cranial nerve deficit.  No focal motor deficit  Skin: Skin is warm and dry.     Diagnostic Tests: CT chest 04/22/2013 CT CHEST WITHOUT CONTRAST  TECHNIQUE:  Multidetector CT imaging of the chest was performed following the  standard protocol without IV contrast.  COMPARISON: 11/25/2012.  FINDINGS:  No pathologically enlarged mediastinal or hilar lymph nodes. Hilar  regions are difficult to definitively evaluate without IV contrast.  Heart is at the upper limits of normal in size. No pericardial  effusion. Tiny hiatal hernia.  There is a pattern of subpleural reticulation, subpleural  ground-glass, traction bronchiolectasis and mild architectural  distortion. Findings are not seen in a definite zonal  predominance.  No honeycombing. No air trapping on expiratory and expiratory  imaging. Right-sided pulmonary nodular densities measure up to 4 mm  and are unchanged. No pleural fluid. Airway is unremarkable.  Incidental imaging of the upper abdomen shows no acute findings.  Low-attenuation lesion off the upper pole left kidney measures 5.1  cm, incompletely imaged but grossly stable. No worrisome lytic or  sclerotic lesions. Degenerative changes are seen in the spine.  IMPRESSION:  1. Pulmonary parenchymal pattern of subpleural fibrosis is unchanged  from 11/25/2012 and may be due to nonspecific interstitial  pneumonitis (NSIP). Usual interstitial pneumonitis is not excluded.  2. Small pulmonary nodules are unchanged from 11/25/2012. Continued  observation is recommended. Typically, for nodules of this size in a  patient who is at increased risk for bronchogenic carcinoma, 1 year  of  documented stability is favored. This recommendation follows the  consensus statement: Guidelines for Management of Small Pulmonary  Nodules Detected on CT Scans: A Statement from the Fleischner  Society as published in Radiology 2005; 237:395-400.  Electronically Signed  By: Leanna Battles M.D.  On: 04/22/2013 14:33   Impression: 62 year old morbidly obese diabetic woman with interstitial lung disease. Her pulmonary symptoms are steroid responsive, but she has issues with prednisone as it relates to her diabetic control and weight.  She needs a lung biopsy to establish a diagnosis and to guide therapy.  I had a long discussion with Ms. Kain and a friend who accompanied her. We reviewed the CT scan. I recommended that we biopsy the right side as the disease is more prevalent there. She understands that this is a diagnostic and not a therapeutic procedure.  I discussed with her the indications, risks, benefits, and alternatives. We discussed the general nature of the procedure including the need for general  anesthesia, the incisions to be used, chest tube drainage postoperatively, the expected hospital stay, and the overall recovery. She understands the risks include, but are not limited to, death, MI, DVT, PE, bleeding, possible need for transfusion, infection, arrhythmias, air leaks, as well as the possibility of unforeseeable complications.  Dr. Kathrin Penner would like her off of prednisone for 2 weeks prior to the biopsy. We will plan to admit her for right VATS, lung biopsy on Monday, December 15.  Plan: Right VATS, lung biopsy Monday, December 15

## 2013-06-30 ENCOUNTER — Other Ambulatory Visit (HOSPITAL_COMMUNITY): Payer: BC Managed Care – HMO

## 2013-06-30 ENCOUNTER — Telehealth: Payer: Self-pay | Admitting: Pulmonary Disease

## 2013-06-30 ENCOUNTER — Inpatient Hospital Stay (HOSPITAL_COMMUNITY): Payer: BC Managed Care – PPO

## 2013-06-30 ENCOUNTER — Encounter (HOSPITAL_COMMUNITY): Payer: Self-pay | Admitting: Thoracic Surgery (Cardiothoracic Vascular Surgery)

## 2013-06-30 DIAGNOSIS — J849 Interstitial pulmonary disease, unspecified: Secondary | ICD-10-CM

## 2013-06-30 LAB — GLUCOSE, CAPILLARY
Glucose-Capillary: 226 mg/dL — ABNORMAL HIGH (ref 70–99)
Glucose-Capillary: 229 mg/dL — ABNORMAL HIGH (ref 70–99)
Glucose-Capillary: 192 mg/dL — ABNORMAL HIGH (ref 70–99)

## 2013-06-30 LAB — BASIC METABOLIC PANEL
BUN: 10 mg/dL (ref 6–23)
CO2: 29 mEq/L (ref 19–32)
Calcium: 8.9 mg/dL (ref 8.4–10.5)
Chloride: 98 mEq/L (ref 96–112)
Creatinine, Ser: 0.67 mg/dL (ref 0.50–1.10)
GFR calc Af Amer: >90 mL/min (ref >90)
GFR calc non Af Amer: >90 mL/min (ref >90)
Glucose, Bld: 182 mg/dL — ABNORMAL HIGH (ref 70–99)
Potassium: 3.9 mEq/L (ref 3.5–5.1)
Sodium: 135 mEq/L (ref 135–145)

## 2013-06-30 LAB — CBC
MCHC: 32.7 g/dL (ref 30.0–36.0)
MCV: 95.2 fL (ref 78.0–100.0)
Platelets: 240 10*3/uL (ref 150–400)
RBC: 4.14 MIL/uL (ref 3.87–5.11)
RDW: 13.8 % (ref 11.5–15.5)
WBC: 11.1 10*3/uL — ABNORMAL HIGH (ref 4.0–10.5)

## 2013-06-30 LAB — BLOOD GAS, ARTERIAL
Acid-Base Excess: 3.2 mmol/L — ABNORMAL HIGH (ref 0.0–2.0)
Bicarbonate: 28 mEq/L — ABNORMAL HIGH (ref 20.0–24.0)
Drawn by: 34779
O2 Content: 3 L/min
O2 Saturation: 97.9 %
Patient temperature: 98.6
TCO2: 29.5 mmol/L (ref 0–100)
pCO2 arterial: 49.1 mmHg — ABNORMAL HIGH (ref 35.0–45.0)
pH, Arterial: 7.375 (ref 7.350–7.450)
pO2, Arterial: 112 mmHg — ABNORMAL HIGH (ref 80.0–100.0)

## 2013-06-30 MED ORDER — INSULIN DETEMIR 100 UNIT/ML ~~LOC~~ SOLN
25.0000 [IU] | Freq: Every day | SUBCUTANEOUS | Status: DC
  Administered 2013-06-30 – 2013-07-04 (×5): 25 [IU] via SUBCUTANEOUS
  Filled 2013-06-30 (×5): qty 0.25

## 2013-06-30 MED ORDER — PROMETHAZINE HCL 25 MG/ML IJ SOLN
12.5000 mg | INTRAMUSCULAR | Status: DC | PRN
Start: 2013-06-30 — End: 2013-07-04

## 2013-06-30 MED ORDER — POTASSIUM CHLORIDE IN NACL 20-0.45 MEQ/L-% IV SOLN
INTRAVENOUS | Status: DC
  Administered 2013-06-30: 10:00:00 via INTRAVENOUS
  Filled 2013-06-30 (×2): qty 1000

## 2013-06-30 MED ORDER — ALBUTEROL SULFATE HFA 108 (90 BASE) MCG/ACT IN AERS
2.0000 | INHALATION_SPRAY | Freq: Four times a day (QID) | RESPIRATORY_TRACT | Status: DC
  Filled 2013-06-30: qty 6.7

## 2013-06-30 MED ORDER — ENOXAPARIN SODIUM 40 MG/0.4ML ~~LOC~~ SOLN
40.0000 mg | SUBCUTANEOUS | Status: DC
  Administered 2013-06-30 – 2013-07-04 (×5): 40 mg via SUBCUTANEOUS
  Filled 2013-06-30 (×5): qty 0.4

## 2013-06-30 MED ORDER — INSULIN ASPART 100 UNIT/ML ~~LOC~~ SOLN
0.0000 [IU] | SUBCUTANEOUS | Status: DC
  Administered 2013-06-30: 7 [IU] via SUBCUTANEOUS
  Administered 2013-06-30 – 2013-07-01 (×4): 4 [IU] via SUBCUTANEOUS

## 2013-06-30 NOTE — Progress Notes (Signed)
Utilization review completed.  

## 2013-06-30 NOTE — Telephone Encounter (Signed)
Yes

## 2013-06-30 NOTE — Op Note (Signed)
NAMEFRANCOISE, Kaitlin Hamilton                  ACCOUNT NO.:  [id number]  MEDICAL RECORD NO.:  [id number]  LOCATION:  3S03C                        FACILITY:  [facility]  PHYSICIAN:  Salvatore Decent. Dorris Fetch, M.D.DATE OF BIRTH:  1950/09/25  DATE OF PROCEDURE:  06/29/2013 DATE OF DISCHARGE:                              OPERATIVE REPORT   PREOPERATIVE DIAGNOSIS:  Interstitial lung disease.  POSTOPERATIVE DIAGNOSIS:  Interstitial lung disease.  PROCEDURE:  Right video-assisted thoracoscopy, lung biopsy of the right upper, middle, and lower lobes.  SURGEON:  Salvatore Decent. Dorris Fetch, MD  ASSISTANT:  Coral Ceo, PA  ANESTHESIA:  General.  FINDINGS:  Lungs were diffusely nodular and pale in appearance.  Most severely affected area was the anterior inferior portion of the right middle lobe.  This was sent for frozen section, which showed a fibrotic process, but no definitive diagnosis could be made.  CLINICAL NOTE:  Kaitlin Hamilton is a 62 year old woman with a history of interstitial lung disease.  She recently had worsening Symptoms and had been started on prednisone; however, she had weight gain and other side effects with prednisone.  An attempt to wean her from prednisone caused her symptoms to worsen.  She was referred for open lung biopsy for diagnostic purposes and to guide therapy.  The indications, risks, benefits, and alternatives were discussed in detail with the patient.  She understood this was a diagnostic and not a therapeutic procedure.  She understood the risks, accepted them, and agreed to proceed.  OPERATIVE NOTE:  Kaitlin Hamilton was brought to the preoperative holding area on June 29, 2013.  Anesthesia placed an arterial blood pressure monitoring line.  She was taken to the operating room, anesthetized, and intubated with a double-lumen endotracheal tube.  Sequential compression devices were placed on the legs for DVT prophylaxis.  She was placed in a left lateral decubitus position.  The  right chest was prepped and draped in the usual sterile fashion.  Single lung ventilation of the left lung was carried out and was tolerated well throughout the procedure.  After performing a time-out, a small incision was made in the right midaxillary line in approximately the seventh intercostal space.  A 5 mm port was placed into the chest and a 5 mm thoracoscope was placed through it.  There was no cross ventilation of the right lung.  A small utility incision was then made, it was carried through the skin and subcutaneous tissue.  The serratus muscles were separated, the intercostal muscles were divided.  All of the work within the chest was performed via the single utility incision.  No rib spreading was performed.  Review of the CT showed the most severely affected area appeared to be the anterior inferior aspect of the right middle lobe.  This area was grasped and biopsy was taken with sequential firings of an endoscopic GIA stapler.  The specimen was removed. It was very nodular and relatively pale. It was sent for frozen section.  While awaiting the results from the frozen section, another area of extensive involvement was identified along the inferior aspect of the lower lobe. Again, a biopsy was performed with sequential firings of an Endo-GIA stapler.  Finally, a biopsy was taken from the inferior aspect of the upper lobe, an area that was relatively less affected.  There was good hemostasis at all staple lines.  The Frozen section showed a fibrotic process, no definitive diagnosis could be made as to the underlying disease process.  A final inspection was made for hemostasis of the staple lines.  ProGEL was applied to the staple lines.  The original port incision was lengthened and a 28- French chest tube was placed through this incision into the pleural space and secured with a #1 silk suture.  The right lung was reinflated. There was good reinflation of all 3 lobes.   The serratus fascia was closed with a running #1 Vicryl suture.  The subcutaneous tissue and skin were closed in standard fashion.  All sponge, needle, and instrument counts were correct at the end of the procedure.  The patient was extubated in the operating room and taken to the postanesthetic care unit in good condition.     Salvatore Decent Dorris Fetch, M.D.     SCH/MEDQ  D:  06/29/2013  T:  06/30/2013  Job:  [id number]

## 2013-06-30 NOTE — Telephone Encounter (Signed)
Dr. Kendrick Fries are okay with this order? Thanks!

## 2013-06-30 NOTE — Progress Notes (Signed)
Chest tube dc'd intact. Patient tolerated without difficulty.  No complaints.

## 2013-06-30 NOTE — Discharge Summary (Signed)
Physician Discharge Summary  Patient ID: Kaitlin Hamilton MRN: [id number] DOB/AGE: Apr 15, 1951 62 y.o.  Admit date: 06/29/2013 Discharge date: 07/03/2013  Admission Diagnoses:  Patient Active Problem List   Diagnosis Date Noted  . Interstitial lung disease 06/29/2013  . GERD (gastroesophageal reflux disease) 06/01/2013  . Esophageal dysmotility 05/17/2013  . Otitis media 05/17/2013  . Idiopathic interstitial pneumonia 10/19/2012   Discharge Diagnoses:   Patient Active Problem List   Diagnosis Date Noted  . Interstitial lung disease 06/29/2013  . GERD (gastroesophageal reflux disease) 06/01/2013  . Esophageal dysmotility 05/17/2013  . Otitis media 05/17/2013  . Idiopathic interstitial pneumonia 10/19/2012   Discharged Condition: good  History of Present Illness:   Kaitlin Hamilton is a 62 year old woman with interstitial lung disease. She first started having problems a couple of years ago when she noticed her breathing would get worse in the winter.  She would developing coughing which at times was productive.  She would also have episodic wheezing and dyspnea with exertion.  She had several episodes last winter requiring antibiotic coverage.  PFTs were obtained by her Primary care physician and were abnormal prompting referral to Pulmonology.  Dr. Kendrick Fries evaluated the patient and felt she had moderate restrictive disease with no air flow obstruction and she was subsequently treated with steroids.  The patient again developed worsening symptoms this fall, starting in October.  She presented to Dr. Kendrick Fries at which time CT scan of the chest was performed and revealed evidence of interstitial lung disease.  She was placed on prednisone with improvement of her symptoms.  However, due to this medication she developed a 15 pound weight gain over a 6 weeks period and did not wish to be on prednisone long term.  She again went to see Dr. Kendrick Fries at which time she decided to undergo lung biopsy.  She was  referred to [facility] for surgical evaluation.  She was evaluated by Dr. Dorris Fetch on 06/21/2013 at which time after review of her CT scan it was felt she would be an appropriate candidate for a Right Vats with lung biopsy for diagnostic purposes.  The risks and benefits of the procedure were explained to the patient and she was agreeable to proceed.  She was instructed to discontinue her Prednisone for 2 weeks prior to her biopsy.    Hospital Course:   Kaitlin Hamilton presented to [facility] on 06/29/2013.  She was taken to the operating room and underwent Right Video Assisted Thoracoscopy with biopsies of the right upper lobe, right middle lobe, and right lower lobe of the right lung.  She tolerated the procedure without difficulty, was extubated and taken to the SICU in stable condition.  The patient has done well post operatively.  Her chest tube had minimal output since surgery and did not exhibit evidence of an air leak.  Her chest tube was removed on post operative day number 1.  Her blood sugars were elevated likely due to use of steroids which required additional support with Lantus.  She is ambulating and tolerating a regular diet.  Should no further issues arise we anticipate discharge home in the next 24-48 hours.  She will need to follow up with Dr. Dorris Fetch in 2 weeks with a CXR prior to her appointment.  Pathology remains pending.    Significant Diagnostic Studies: CT Scan  1. Pulmonary parenchymal pattern of subpleural fibrosis is unchanged  from 11/25/2012 and may be due to nonspecific interstitial  pneumonitis (NSIP). Usual interstitial pneumonitis is not  excluded.  2. Small pulmonary nodules are unchanged from 11/25/2012. Continued  observation is recommended. Typically, for nodules of this size in a  patient who is at increased risk for bronchogenic carcinoma, 1 year  of documented stability is favored. This recommendation follows the  consensus statement: Guidelines for  Management of Small Pulmonary  Nodules Detected on CT Scans: A Statement from the Fleischner  Society as published in Radiology 2005; 237:395-400.  Treatments: surgery:   RIGHT VIDEO ASSISTED THORACOSCOPY  BIOPSIES OF RIGHT UPPER, MIDDLE, AND LOWER LOBES  Disposition: Home  Discharge Medications:    Medication List    STOP taking these medications       levofloxacin 750 MG tablet  Commonly known as:  LEVAQUIN      TAKE these medications       bismuth-metronidazole-tetracycline 140-125-125 MG per capsule  Commonly known as:  PYLERA  Take 3 capsules by mouth 4 (four) times daily -  before meals and at bedtime.     FIBER PO  Take 1 tablet by mouth daily.     fluticasone 50 MCG/ACT nasal spray  Commonly known as:  FLONASE  Place 2 sprays into the nose daily.     glipiZIDE 5 MG tablet  Commonly known as:  GLUCOTROL  Take 5 mg by mouth daily.     hydrochlorothiazide 12.5 MG capsule  Commonly known as:  MICROZIDE  Take 12.5 mg by mouth daily.     HYDROcodone-acetaminophen 5-325 MG per tablet  Commonly known as:  NORCO/VICODIN  Take 1-2 tablets by mouth every 6 (six) hours as needed for moderate pain.     levothyroxine 125 MCG tablet  Commonly known as:  SYNTHROID, LEVOTHROID  Take 125 mcg by mouth daily before breakfast.     montelukast 10 MG tablet  Commonly known as:  SINGULAIR  Take 10 mg by mouth at bedtime.     pantoprazole 40 MG tablet  Commonly known as:  PROTONIX  Take one tablet by mouth twice daily for 10 days while on PYLERA, then switch back to daily.     predniSONE 20 MG tablet  Commonly known as:  DELTASONE  Take 2 tablets (40 mg total) by mouth daily with breakfast.     TUSSIONEX PENNKINETIC ER 10-8 MG/5ML Lqcr  Generic drug:  chlorpheniramine-HYDROcodone  Take 5 mLs by mouth every 12 (twelve) hours as needed for cough.     verapamil 180 MG 24 hr capsule  Commonly known as:  VERELAN PM  Take 180 mg by mouth daily.          Future  Appointments Provider Department Dept Phone   07/12/2013 1:00 PM Loreli Slot, MD [facility] [phone]   07/27/2013 2:00 PM Lupita Leash, MD [facility] [phone]     Follow-up Information   Follow up with Loreli Slot, MD.   Specialty:  Cardiothoracic Surgery   Contact information:   [address] [place] [address] [place] [address] [phone]       Follow up with [facility].   Contact information:   [place]       Follow up with MALONEY,NANCY, MD. (Call for an appointment regarding further diabeters management)    Specialty:  Family Medicine   Contact information:   [address] [place] [address] [phone]       Signed: Ardelle Balls PA-C 07/03/2013, 9:07 AM

## 2013-06-30 NOTE — Progress Notes (Addendum)
      [address]       [place] [address]             [phone]      1 Day Post-Op Procedure(s) (LRB): VIDEO ASSISTED THORACOSCOPY (Right) LUNG BIOPSY (Right)  Subjective:  Ms. Carboni states she is "so/so" this morning.  She has some pain and did have some nausea and vomiting after taking oral pain medication yesterday  Objective: Vital signs in last 24 hours: Temp:  [97.2 F (36.2 C)-99 F (37.2 C)] 98.4 F (36.9 C) (12/11 0715) Pulse Rate:  [69-93] 69 (12/11 0717) Cardiac Rhythm:  [-] Normal sinus rhythm (12/11 0738) Resp:  [17-32] 22 (12/11 0717) BP: (121-218)/(60-144) 121/65 mmHg (12/11 0717) SpO2:  [87 %-100 %] 96 % (12/11 0717) Arterial Line BP: (125-205)/(54-96) 143/64 mmHg (12/11 0717) Weight:  [246 lb 14.6 oz (112 kg)] 246 lb 14.6 oz (112 kg) (12/10 1720)  Intake/Output from previous day: 12/10 0701 - 12/11 0700 In: 1250 [I.V.:1200; IV Piggyback:50] Out: 1355 [Urine:1175; Chest Tube:180] Intake/Output this shift: Total I/O In: -  Out: 125 [Urine:125]  General appearance: alert, cooperative and no distress Heart: regular rate and rhythm Lungs: clear to auscultation bilaterally Abdomen: soft, non-tender; bowel sounds normal; no masses,  no organomegaly Wound: clean and dry  Lab Results:  Recent Labs  06/28/13 1520 06/30/13 0444  WBC 11.3* 11.1*  HGB 14.5 12.9  HCT 42.7 39.4  PLT 259 240   BMET:  Recent Labs  06/28/13 1520 06/30/13 0444  NA 136 135  K 3.7 3.9  CL 99 98  CO2 23 29  GLUCOSE 134* 182*  BUN 17 10  CREATININE 0.78 0.67  CALCIUM 9.7 8.9    PT/INR:  Recent Labs  06/28/13 1520  LABPROT 12.5  INR 0.95   ABG    Component Value Date/Time   PHART 7.375 06/30/2013 0500   HCO3 28.0* 06/30/2013 0500   TCO2 29.5 06/30/2013 0500   O2SAT 97.9 06/30/2013 0500   CBG (last 3)   Recent Labs  06/29/13 1833 06/29/13 2331 06/30/13 0703  GLUCAP 272* 226* 229*    Assessment/Plan: S/P Procedure(s)  (LRB): VIDEO ASSISTED THORACOSCOPY (Right) LUNG BIOPSY (Right)  1. Chest tube- 180 cc output since surgery, during evaluation chest tube was on water seal and no air leak was appreciated, will leave off suction 2. DM- sugars have been above 200, patient getting steroids and IV dextrose- will switch IV Fluids to 1/2 NS with Potassium to hopefully decrease sugars 3. Hypothyroidism- continue synthroid 4. HTN- controlled on home Verapamil 5. Decrease IV Fluids 6. D/C Arterial Line 7. D/C Foley 8. DIspo- patient stable, will advance diet as tolerated, place chest tube to water seal   LOS: 1 day    BARRETT, ERIN 06/30/2013  Patient seen and examined, agree with above No air leak on water seal, minimal drainage- will dc CT Sugars up, likely due to IV steroids- will add levemir, restart glipizide OOB, ambulate Add enoxaparin to SCD for DVT prophylaxis

## 2013-07-01 ENCOUNTER — Inpatient Hospital Stay (HOSPITAL_COMMUNITY): Payer: BC Managed Care – PPO

## 2013-07-01 LAB — COMPREHENSIVE METABOLIC PANEL
AST: 14 U/L (ref 0–37)
BUN: 8 mg/dL (ref 6–23)
CO2: 29 mEq/L (ref 19–32)
Chloride: 98 mEq/L (ref 96–112)
Creatinine, Ser: 0.66 mg/dL (ref 0.50–1.10)
GFR calc non Af Amer: >90 mL/min (ref >90)
Glucose, Bld: 104 mg/dL — ABNORMAL HIGH (ref 70–99)
Potassium: 3.6 mEq/L (ref 3.5–5.1)
Total Bilirubin: 0.4 mg/dL (ref 0.3–1.2)

## 2013-07-01 LAB — CBC
HCT: 40.6 % (ref 36.0–46.0)
MCH: 31.2 pg (ref 26.0–34.0)
MCV: 94.4 fL (ref 78.0–100.0)
Platelets: 242 10*3/uL (ref 150–400)
RBC: 4.3 MIL/uL (ref 3.87–5.11)
RDW: 13.6 % (ref 11.5–15.5)
WBC: 12 10*3/uL — ABNORMAL HIGH (ref 4.0–10.5)

## 2013-07-01 LAB — GLUCOSE, CAPILLARY
Glucose-Capillary: 173 mg/dL — ABNORMAL HIGH (ref 70–99)
Glucose-Capillary: 116 mg/dL — ABNORMAL HIGH (ref 70–99)
Glucose-Capillary: 186 mg/dL — ABNORMAL HIGH (ref 70–99)
Glucose-Capillary: 133 mg/dL — ABNORMAL HIGH (ref 70–99)

## 2013-07-01 MED ORDER — INSULIN ASPART 100 UNIT/ML ~~LOC~~ SOLN
0.0000 [IU] | Freq: Three times a day (TID) | SUBCUTANEOUS | Status: DC
  Administered 2013-07-01 – 2013-07-03 (×5): 7 [IU] via SUBCUTANEOUS
  Administered 2013-07-03: 3 [IU] via SUBCUTANEOUS
  Administered 2013-07-03: 7 [IU] via SUBCUTANEOUS
  Administered 2013-07-04: 3 [IU] via SUBCUTANEOUS

## 2013-07-01 MED ORDER — HYDROCODONE-ACETAMINOPHEN 5-325 MG PO TABS: 1.0000 | ORAL_TABLET | Freq: Four times a day (QID) | ORAL | Status: DC | PRN

## 2013-07-01 MED ORDER — INSULIN ASPART 100 UNIT/ML ~~LOC~~ SOLN: 0.0000 [IU] | Freq: Every day | SUBCUTANEOUS | Status: DC

## 2013-07-01 MED ORDER — LACTULOSE 10 GM/15ML PO SOLN
30.0000 g | Freq: Once | ORAL | Status: AC
  Administered 2013-07-01: 30 g via ORAL
  Filled 2013-07-01: qty 45

## 2013-07-01 NOTE — Telephone Encounter (Signed)
LMOM for Kaitlin Hamilton that order was placed for lift chair through [facility] in [facility]

## 2013-07-01 NOTE — Progress Notes (Addendum)
      [address]       [place] [address]             [phone]      2 Days Post-Op Procedure(s) (LRB): VIDEO ASSISTED THORACOSCOPY (Right) LUNG BIOPSY (Right)  Subjective:  Kaitlin Hamilton is feeling better this morning.  She does have a productive cough, which she states is unchanged from prior to surgery.  She is ambulating some and has been encouraged to do so.  Her sats drop with ambulation, but rebound with rest. No BM  Objective: Vital signs in last 24 hours: Temp:  [97.6 F (36.4 C)-98.6 F (37 C)] 98.6 F (37 C) (12/12 0400) Pulse Rate:  [58-74] 68 (12/12 0400) Cardiac Rhythm:  [-] Normal sinus rhythm (12/11 1900) Resp:  [15-26] 17 (12/12 0400) BP: (111-127)/(56-63) 111/56 mmHg (12/12 0400) SpO2:  [96 %-99 %] 98 % (12/12 0400)  Intake/Output from previous day: 12/11 0701 - 12/12 0700 In: 1190 [P.O.:640; I.V.:550] Out: 245 [Urine:225; Chest Tube:20]  General appearance: alert, cooperative and no distress Heart: regular rate and rhythm Lungs: clear to auscultation bilaterally Abdomen: soft, non-tender; bowel sounds normal; no masses,  no organomegaly Wound: clean and dry  Lab Results:  Recent Labs  06/30/13 0444 07/01/13 0355  WBC 11.1* 12.0*  HGB 12.9 13.4  HCT 39.4 40.6  PLT 240 242   BMET:  Recent Labs  06/30/13 0444 07/01/13 0355  NA 135 137  K 3.9 3.6  CL 98 98  CO2 29 29  GLUCOSE 182* 104*  BUN 10 8  CREATININE 0.67 0.66  CALCIUM 8.9 9.5    PT/INR:  Recent Labs  06/28/13 1520  LABPROT 12.5  INR 0.95   ABG    Component Value Date/Time   PHART 7.375 06/30/2013 0500   HCO3 28.0* 06/30/2013 0500   TCO2 29.5 06/30/2013 0500   O2SAT 97.9 06/30/2013 0500   CBG (last 3)   Recent Labs  06/30/13 1930 06/30/13 2324 07/01/13 0339  GLUCAP 209* 173* 116*    Assessment/Plan: S/P Procedure(s) (LRB): VIDEO ASSISTED THORACOSCOPY (Right) LUNG BIOPSY (Right)  1. Chest tube- removed yesterday, no significant  pneumothorax, or effusions on CXR 2. DM- sugars are better controlled, continue current regimen 3. Pulm- productive cough on home cough medicine, continue IS 4. GI- LOC constipation, will add lacutlose 5. Pain control- d/c PCA, itching with OXY yesterday, will start Norco 6. Decrease IV Fluids 7. Dispo- patient stable, will see if pain is controlled with use of hydrocodone, patients caretakers are sick with viral illness, patient would prefer to be discharged in AM   LOS: 2 days    BARRETT, ERIN 07/01/2013  Patient seen and examined. I agree with above Will change CBG to AC/HS SCD + lovenox for DVT prophylaxis

## 2013-07-01 NOTE — Progress Notes (Signed)
[facility] PCCM/ELINK MD Note  Patient well known to me from clinic Appreciate care from thoracic surgery Appears to be doing well post op  Agree with prednisone 40mg , please continue until post discharge  Will try to see her in person prior to hospital discharge.  Yolonda Kida PCCM Pager: [phone] Cell: [phone] If no response, call [phone]

## 2013-07-02 LAB — GLUCOSE, CAPILLARY: Glucose-Capillary: 115 mg/dL — ABNORMAL HIGH (ref 70–99)

## 2013-07-02 MED ORDER — POLYETHYLENE GLYCOL 3350 17 G PO PACK
17.0000 g | PACK | Freq: Every day | ORAL | Status: DC
  Administered 2013-07-02 – 2013-07-03 (×2): 17 g via ORAL
  Filled 2013-07-02 (×3): qty 1

## 2013-07-02 NOTE — Progress Notes (Addendum)
      [address]       [place] [address]             [phone]       3 Days Post-Op Procedure(s) (LRB): VIDEO ASSISTED THORACOSCOPY (Right) LUNG BIOPSY (Right)  Subjective: Patient states pain is under better control. She is constipate.d  Objective: Vital signs in last 24 hours: Temp:  [97.4 F (36.3 C)-97.9 F (36.6 C)] 97.5 F (36.4 C) (12/13 0731) Pulse Rate:  [61-84] 81 (12/13 0731) Cardiac Rhythm:  [-] Normal sinus rhythm (12/13 0731) Resp:  [16-27] 27 (12/13 0731) BP: (109-148)/(65-75) 120/74 mmHg (12/13 0731) SpO2:  [82 %-97 %] 85 % (12/13 0731)     Intake/Output from previous day: 12/12 0701 - 12/13 0700 In: 360 [P.O.:360] Out: -   Physical Exam:  Cardiovascular: RRR Pulmonary:Slightly diminished on right baset; no rales, wheezes, or rhonchi. Abdomen: Soft, non tender, bowel sounds present. Wounds: Clean and dry.  No erythema or signs of infection.   Lab Results: CBC: Recent Labs  06/30/13 0444 07/01/13 0355  WBC 11.1* 12.0*  HGB 12.9 13.4  HCT 39.4 40.6  PLT 240 242   BMET:  Recent Labs  06/30/13 0444 07/01/13 0355  NA 135 137  K 3.9 3.6  CL 98 98  CO2 29 29  GLUCOSE 182* 104*  BUN 10 8  CREATININE 0.67 0.66  CALCIUM 8.9 9.5    PT/INR: No results found for this basename: LABPROT, INR,  in the last 72 hours ABG:  INR: Will add last result for INR, ABG once components are confirmed Will add last 4 CBG results once components are confirmed  Assessment/Plan:  1. CV - SR. On Verapamil 180 daily and Microzide 12.5 daily. 2.  Pulmonary - Patient on oxygen via [place]. She does desat on room air into the 70's. Will likely need home oxygen.Encourage incentive spirometer. Final pathology pending. 3. DM-CBGs 227/133/115. On Glipizide 5 daily and Levemir 25 daily. 4. LOC constipation 5. Possible discharge in am  ZIMMERMAN,DONIELLE MPA-C 07/02/2013,9:17 AM  Still on O2 Poss home in am I have seen and examined Tharon Aquas and agree with the above assessment  and plan.  Delight Ovens MD Beeper [phone] Office [phone] 07/02/2013 11:02 AM

## 2013-07-03 ENCOUNTER — Inpatient Hospital Stay (HOSPITAL_COMMUNITY): Payer: BC Managed Care – PPO

## 2013-07-03 LAB — GLUCOSE, CAPILLARY
Glucose-Capillary: 131 mg/dL — ABNORMAL HIGH (ref 70–99)
Glucose-Capillary: 234 mg/dL — ABNORMAL HIGH (ref 70–99)
Glucose-Capillary: 202 mg/dL — ABNORMAL HIGH (ref 70–99)
Glucose-Capillary: 112 mg/dL — ABNORMAL HIGH (ref 70–99)

## 2013-07-03 MED ORDER — HYDROCODONE-ACETAMINOPHEN 5-325 MG PO TABS: 1.0000 | ORAL_TABLET | Freq: Four times a day (QID) | ORAL | Status: DC | PRN

## 2013-07-03 MED ORDER — FLEET ENEMA 7-19 GM/118ML RE ENEM
1.0000 | ENEMA | Freq: Once | RECTAL | Status: AC
  Administered 2013-07-03: 1 via RECTAL
  Filled 2013-07-03: qty 1

## 2013-07-03 NOTE — Progress Notes (Addendum)
      [address]       [place] [address]             [phone]       4 Days Post-Op Procedure(s) (LRB): VIDEO ASSISTED THORACOSCOPY (Right) LUNG BIOPSY (Right)  Subjective: Patient still has not had a bowel movement, despite Lactulose, Miralax, and prune juice. She is "very concerned about this".  Objective: Vital signs in last 24 hours: Temp:  [97.4 F (36.3 C)-98.3 F (36.8 C)] 97.9 F (36.6 C) (12/14 0748) Pulse Rate:  [64-75] 64 (12/14 0332) Cardiac Rhythm:  [-] Normal sinus rhythm (12/14 0332) Resp:  [20-29] 20 (12/14 0332) BP: (110-126)/(52-74) 120/65 mmHg (12/14 0332) SpO2:  [94 %-96 %] 94 % (12/14 0332)     Intake/Output from previous day: 12/13 0701 - 12/14 0700 In: 1560 [P.O.:1560] Out: -   Physical Exam:  Cardiovascular: RRR Pulmonary:Slightly diminished on right baset; no rales, wheezes, or rhonchi. Abdomen: Soft, non tender, bowel sounds present. Wounds: Clean and dry.  No erythema or signs of infection.   Lab Results: CBC:  Recent Labs  07/01/13 0355  WBC 12.0*  HGB 13.4  HCT 40.6  PLT 242   BMET:   Recent Labs  07/01/13 0355  NA 137  K 3.6  CL 98  CO2 29  GLUCOSE 104*  BUN 8  CREATININE 0.66  CALCIUM 9.5    PT/INR: No results found for this basename: LABPROT, INR,  in the last 72 hours ABG:  INR: Will add last result for INR, ABG once components are confirmed Will add last 4 CBG results once components are confirmed  Assessment/Plan:  1. CV - SR. On Verapamil 180 daily and Microzide 12.5 daily. 2.  Pulmonary - Patient on oxygen via [place]. She does desat on room air into the 80's. Will  need home oxygen.CXR this am shows chronic interstitial changes and low lung volumes. No pneumothorax.Encourage incentive spirometer. Final pathology pending. 3. DM-CBGs 225/130/131. On Glipizide 5 daily and Levemir 25 daily. 4. Enema for constipation 5. Possible discharge later today vs am  Hamilton,Kaitlin  MPA-C 07/03/2013,8:35 AM  repeated cxr stable Arrange for home o2  Plan d/c in am I have seen and examined Kaitlin Hamilton and agree with the above assessment  and plan.  Delight Ovens MD Beeper [phone] Office [phone] 07/03/2013 10:42 AM

## 2013-07-04 LAB — GLUCOSE, CAPILLARY

## 2013-07-04 NOTE — Progress Notes (Signed)
       [address]       [place] [address]             [phone]          5 Days Post-Op Procedure(s) (LRB): VIDEO ASSISTED THORACOSCOPY (Right) LUNG BIOPSY (Right)  Subjective: Feels well, no complaints.  +BM last night.  Breathing stable.   Objective: Vital signs in last 24 hours: Patient Vitals for the past 24 hrs:  BP Temp Temp src Pulse Resp SpO2  07/04/13 0343 128/65 mmHg 98.1 F (36.7 C) Oral 62 22 95 %  07/04/13 0045 - - - 67 20 85 %  07/03/13 2323 124/42 mmHg 97.9 F (36.6 C) Oral 72 22 92 %  07/03/13 1947 125/66 mmHg 97.8 F (36.6 C) Oral 78 22 92 %  07/03/13 1757 - - - 84 22 96 %  07/03/13 1604 143/74 mmHg 98.6 F (37 C) Oral - - -  07/03/13 1500 - - - 78 16 92 %  07/03/13 1216 144/75 mmHg 98.1 F (36.7 C) Oral - 27 91 %  07/03/13 0803 128/75 mmHg - - 70 13 89 %  07/03/13 0748 - 97.9 F (36.6 C) Oral - - -   Current Weight  06/29/13 246 lb 14.6 oz (112 kg)     Intake/Output from previous day: 12/14 0701 - 12/15 0700 In: 960 [P.O.:960] Out: -   CBGs 234-202-112    PHYSICAL EXAM:  Heart: RRR Lungs: Clear Wound: Clean and dry    Lab Results: CBC:No results found for this basename: WBC, HGB, HCT, PLT,  in the last 72 hours BMET: No results found for this basename: NA, K, CL, CO2, GLUCOSE, BUN, CREATININE, CALCIUM,  in the last 72 hours  PT/INR: No results found for this basename: LABPROT, INR,  in the last 72 hours    Assessment/Plan: S/P Procedure(s) (LRB): VIDEO ASSISTED THORACOSCOPY (Right) LUNG BIOPSY (Right) Stable, doing well. Plan home today on home O2. Instructions reviewed with patient.   LOS: 5 days    Rabecka Brendel H 07/04/2013

## 2013-07-04 NOTE — Progress Notes (Addendum)
SATURATION QUALIFICATIONS: (This note is used to comply with regulatory documentation for home oxygen)  Patient Saturations on Room Air at Rest = 94%  Patient Saturations on Room Air while Ambulating =84% Patient Saturations on Liters of oxygen while Ambulating = 2L-  90 Please briefly explain why patient needs home oxygen: post op VATS

## 2013-07-04 NOTE — Progress Notes (Signed)
Pt sats 80% on RA while pt sleeping.  No apnea noted.  Pt placed on oxygen 2L, sats immediately up to 93%

## 2013-07-04 NOTE — Progress Notes (Signed)
Patient discharged home. Instructions given and reviewed with patient and family. Patient did not want to wait for oxygen tank to get here. Oxygen tank will be delivered to her house later today.

## 2013-07-04 NOTE — Care Management Note (Signed)
    Page 1 of 2   07/04/2013     11:18:51 AM   CARE MANAGEMENT NOTE 07/04/2013  Patient:  Kaitlin Hamilton, Kaitlin Hamilton   Account Number:  [id number]  Date Initiated:  06/30/2013  Documentation initiated by:  Donn Pierini  Subjective/Objective Assessment:   Pt admitted s/p VATS     Action/Plan:   PTA pt lived at home- anticipate return home   Anticipated DC Date:  07/04/2013   Anticipated DC Plan:  HOME/SELF CARE      DC Planning Services  CM consult      Choice offered to / List presented to:     DME arranged  [facility]      DME agency  [facility]        Status of service:  Completed, signed off Medicare Important Message given?   (If response is "NO", the following Medicare IM given date fields will be blank) Date Medicare IM given:   Date Additional Medicare IM given:    Discharge Disposition:  HOME/SELF CARE  Per UR Regulation:  Reviewed for med. necessity/level of care/duration of stay  If discussed at Long Length of Stay Meetings, dates discussed:    Comments:  07/04/13- 1000- Donn Pierini RN, BSN [phone] Per RN - pt for d/c today with home 02- looked in chart and order for home 02 placed over weekend- called Jill Alexanders with [facility] who states that 02 has not been set up and it will be close to noon before 02 can be delivered- pt dropped sats to 84% while ambulating- note in chart to qualify- pt sats in the 90s while at rest. Spoke with pt and family at bedside regarding the timeframe for delivery of 02 to room - pt does not want to wait that long for delivery and would like to go on home and have 02 delivered to home- bedside RN feels like pt will be ok doing this as pt has been on RA all morning and sats have been in the 90s- per [place] with [facility] - 02 can be delivered to the home within the next serveral hours- pt has opted to go on home and have 02 delivered there - family agrees with plan.

## 2013-07-05 ENCOUNTER — Encounter (HOSPITAL_COMMUNITY): Payer: Self-pay

## 2013-07-07 ENCOUNTER — Telehealth: Payer: Self-pay | Admitting: Pulmonary Disease

## 2013-07-07 NOTE — Telephone Encounter (Signed)
I called and spoke with pt. I advised her she can try OTC melatonin to see if this would help. Nothing further needed

## 2013-07-08 ENCOUNTER — Other Ambulatory Visit: Payer: Self-pay | Admitting: *Deleted

## 2013-07-08 ENCOUNTER — Telehealth: Payer: Self-pay

## 2013-07-08 ENCOUNTER — Telehealth: Payer: Self-pay | Admitting: Pulmonary Disease

## 2013-07-08 DIAGNOSIS — J8410 Pulmonary fibrosis, unspecified: Secondary | ICD-10-CM

## 2013-07-08 NOTE — Telephone Encounter (Signed)
Spoke with patient-had biopsy done on 06-29-13 at [facility]; BQ do you have results yet as patient is eager to know prior to the weekend. Thanks.

## 2013-07-08 NOTE — Telephone Encounter (Signed)
I called Kaitlin Hamilton to let her know that her biopsy showed UIP.  For now she is to continue taking the prednisone.

## 2013-07-08 NOTE — Telephone Encounter (Signed)
Per BQ, I called the pt to have her arrive at 1:45 on 01/0/15.  The 1:45 slot is blocked for this per his request.  Pt is aware, nothing further needed at this time. Leston Schueller L, CMA

## 2013-07-12 ENCOUNTER — Ambulatory Visit (INDEPENDENT_AMBULATORY_CARE_PROVIDER_SITE_OTHER): Payer: BC Managed Care – PPO | Admitting: Thoracic Surgery (Cardiothoracic Vascular Surgery)

## 2013-07-12 ENCOUNTER — Other Ambulatory Visit: Payer: Self-pay | Admitting: *Deleted

## 2013-07-12 ENCOUNTER — Encounter: Payer: Self-pay | Admitting: Thoracic Surgery (Cardiothoracic Vascular Surgery)

## 2013-07-12 ENCOUNTER — Ambulatory Visit
Admission: RE | Admit: 2013-07-12 | Discharge: 2013-07-12 | Disposition: A | Discharge status: Home / Self Care | Payer: BC Managed Care – PPO | Source: Ambulatory Visit | Attending: Thoracic Surgery (Cardiothoracic Vascular Surgery) | Admitting: Thoracic Surgery (Cardiothoracic Vascular Surgery)

## 2013-07-12 VITALS — BP 138/88 | HR 84 | Resp 20 | Ht 66.0 in | Wt 246.0 lb

## 2013-07-12 DIAGNOSIS — Z9889 Other specified postprocedural states: Secondary | ICD-10-CM

## 2013-07-12 DIAGNOSIS — J8410 Pulmonary fibrosis, unspecified: Secondary | ICD-10-CM

## 2013-07-12 DIAGNOSIS — J849 Interstitial pulmonary disease, unspecified: Secondary | ICD-10-CM

## 2013-07-12 DIAGNOSIS — G4700 Insomnia, unspecified: Secondary | ICD-10-CM

## 2013-07-12 DIAGNOSIS — Z09 Encounter for follow-up examination after completed treatment for conditions other than malignant neoplasm: Secondary | ICD-10-CM

## 2013-07-12 MED ORDER — ZOLPIDEM TARTRATE 5 MG PO TABS: 5.0000 mg | ORAL_TABLET | Freq: Every evening | ORAL | Status: DC | PRN

## 2013-07-12 NOTE — Progress Notes (Signed)
HPI:  Kaitlin Hamilton is a 62 yo woman with ILD who underwent a right thoracoscopic lung biopsy on 06/29/2013. Her postoperative course was unremarkable. She was discharged on 12/15. She was desaturating at night when she was asleep so she went home on O2 for night time use.   Since discharge she has had difficulty sleeping. She's tried melatonin and Benadryl, those did not help. She has taken Ambien in the past and that worked for her. She does still have some incisional pain. She switched from hydrocodone to Ultram yesterday. She is taking one Ultram tablet every 4-6 hours. Her breathing is been relatively stable. She says that since she hasn't been sleeping at night her oxygen levels have not been dropping. He complained of dizziness this morning but felt that was due to riding in a car and lack of sleep.  Past Medical History  Diagnosis Date  . Bronchitis   . Chicken pox   . Depression   . Allergic rhinitis     uses Flonase daily  . Hyperlipidemia     lost 43 pounds and no meds required at present  . Hypokalemia   . Anxiety   . PONV (postoperative nausea and vomiting)   . Hypertension     takes Verapamil and HCTZ daily  . Shortness of breath     with exertion;takes Singulair daily as well as Flonase  . Pneumonia     last time in 2006  . History of bronchitis     2013  . H/O hiatal hernia   . Migraines     last one about a month ago  . Dysphagia   . Joint swelling     left thumb  . OA (osteoarthritis)     left knee  . GERD (gastroesophageal reflux disease)     takes Protonix daily  . Hemorrhoids   . History of colon polyps   . Diverticulosis   . Urinary frequency   . History of kidney stones   . Non-insulin dependent type 2 diabetes mellitus     takes Glipizide daily  . Constipation     takes Fiber daily  . Hypothyroidism (acquired)     takes Synthroid daily  . Insomnia     doesn't take any meds for this      Current Outpatient Prescriptions  Medication Sig  Dispense Refill  . acetaminophen (TYLENOL) 325 MG tablet Take 650 mg by mouth every 6 (six) hours as needed for mild pain.      Marland Kitchen bismuth-metronidazole-tetracycline (PYLERA) 140-125-125 MG per capsule Take 3 capsules by mouth 4 (four) times daily -  before meals and at bedtime.  120 capsule  0  . chlorpheniramine-HYDROcodone (TUSSIONEX PENNKINETIC ER) 10-8 MG/5ML LQCR Take 5 mLs by mouth every 12 (twelve) hours as needed for cough.      . FIBER PO Take 1 tablet by mouth daily.      . fluticasone (FLONASE) 50 MCG/ACT nasal spray Place 2 sprays into the nose daily.       Marland Kitchen glipiZIDE (GLUCOTROL) 5 MG tablet Take 5 mg by mouth daily.      . hydrochlorothiazide (MICROZIDE) 12.5 MG capsule Take 12.5 mg by mouth daily.      Marland Kitchen levothyroxine (SYNTHROID, LEVOTHROID) 125 MCG tablet Take 125 mcg by mouth daily before breakfast.      . montelukast (SINGULAIR) 10 MG tablet Take 10 mg by mouth at bedtime.      . pantoprazole (PROTONIX) 40 MG tablet Take one tablet  by mouth twice daily for 10 days while on PYLERA, then switch back to daily.  20 tablet  0  . predniSONE (DELTASONE) 20 MG tablet Take 2 tablets (40 mg total) by mouth daily with breakfast.  60 tablet  0  . traMADol (ULTRAM) 50 MG tablet Take by mouth every 6 (six) hours as needed.      . verapamil (VERELAN PM) 180 MG 24 hr capsule Take 180 mg by mouth daily.      Marland Kitchen zolpidem (AMBIEN) 5 MG tablet Take 1 tablet (5 mg total) by mouth at bedtime as needed for sleep.  30 tablet  0   No current facility-administered medications for this visit.    Physical Exam BP 138/88  Pulse 84  Resp 20  Ht 5\' 6"  (1.676 m)  Wt 246 lb (111.585 kg)  BMI 39.72 kg/m2  SpO2 93% Morbidly obese 62 year old woman in no acute distress Incisions well healed Lungs diminished at bases bilaterally  Diagnostic Tests: Chest x-ray 07/12/2013 CHEST 2 VIEW  COMPARISON: 07/13/2013 and earlier.  FINDINGS:  Stable to mildly improved lung volumes. Stable cardiac size and   mediastinal contours. Visualized tracheal air column is within  normal limits. No pneumothorax, pulmonary edema, pleural effusion or  consolidation. Stable to mild regression of peripheral and basilar  interstitial markings, with residual opacity better depicted on the  lateral view today. Stable visualized osseous structures.  IMPRESSION:  No new cardiopulmonary abnormality with stable to mildly improved  ventilation compared to recent exams.  Electronically Signed  By: Augusto Gamble M.D.  On: 07/12/2013 12:42  Pathology Diagnosis 1. Lung, wedge biopsy/resection, Right middle - USUAL INTERSTITIAL PNEUMONIA, SEE COMMENT. 2. Lung, wedge biopsy/resection, Right lower lobe - USUAL INTERSTITIAL PNEUMONIA, SEE COMMENT. 3. Lung, wedge biopsy/resection, Right upper lobe - USUAL INTERSTITIAL PNEUMONIA, SEE COMMENT.  Impression: 62 year old woman who is now about 2 weeks out from a thoracoscopic lung biopsy. She really is doing quite well. I would expect her to still be tired and sore at this point. She has been having difficulty sleeping. I gave her a prescription for Ambien 5 mg tablets one by mouth each bedtime when necessary, 30 tablets, no refills. That prescription was submitted to her pharmacy in [place].  I told her she can start driving next week if she is not having to take the pain medication more than twice a day. Otherwise there really are not any restrictions on her activities. She should just build into new activities gradually and be cautious when starting new things as it may cause more pain.  She asked about returning to work. We'll tentatively decided in mid January. She sees Dr. Kendrick Fries on January 7. I told her that if she's doing well and he is happy with her from a pulmonary standpoint she can go back to work as planned in mid January.  Plan: She will followup with Dr. Kendrick Fries.  I will be happy to see her back any time in the future if I can be of any further assistance with  her care.

## 2013-07-17 MED ORDER — FLUCONAZOLE 100 MG PO TABS: ORAL_TABLET | ORAL | Status: DC

## 2013-07-17 NOTE — Telephone Encounter (Signed)
She was recently treated with antibiotics for H. pylori.  She's complaining of burning and itching in the perineal area and a white coating of her tongue.  I explained to her that she likely has a yeast infection.  Fluconazole was prescribed

## 2013-07-18 ENCOUNTER — Telehealth: Payer: Self-pay | Admitting: Gastroenterology

## 2013-07-18 NOTE — Telephone Encounter (Signed)
Started prior auth for Pylera  

## 2013-07-19 ENCOUNTER — Telehealth: Payer: Self-pay | Admitting: Emergency Medicine

## 2013-07-19 MED ORDER — HYDROCOD POLST-CHLORPHEN POLST 10-8 MG/5ML PO LQCR: 5.0000 mL | Freq: Two times a day (BID) | ORAL | Status: DC | PRN

## 2013-07-19 NOTE — Telephone Encounter (Signed)
I spoke with pt. She is asking RX for tussionex. (EPIC does not show last time we refilled this). She is aware RX will need to be picked up from [facility] office. Advised her will call for pick up once this is approved Per last OV 06/24/13 W/ Dr. Kendrick Fries: Patient Instructions      Take the Levaquin for 7 days in a row Continue taking the Tussionex   --Since BQ is scheduled off please advise SN regarding refill thanks

## 2013-07-19 NOTE — Telephone Encounter (Signed)
Called and spoke with pt and she is aware of rx being ready to be picked up. Pt stated that her sister Lupita Leash nelson will be coming by to pick this up.

## 2013-07-19 NOTE — Telephone Encounter (Signed)
Per SN---  tussionex  #4oz  1 tsp every 12 hours prn cough.

## 2013-07-19 NOTE — Telephone Encounter (Signed)
RX printed. Please advise once done thanks

## 2013-07-27 ENCOUNTER — Other Ambulatory Visit (INDEPENDENT_AMBULATORY_CARE_PROVIDER_SITE_OTHER): Payer: BC Managed Care – PPO

## 2013-07-27 ENCOUNTER — Ambulatory Visit (INDEPENDENT_AMBULATORY_CARE_PROVIDER_SITE_OTHER): Payer: BC Managed Care – PPO | Admitting: Pulmonary Disease

## 2013-07-27 ENCOUNTER — Encounter: Payer: Self-pay | Admitting: *Deleted

## 2013-07-27 ENCOUNTER — Encounter: Payer: Self-pay | Admitting: Pulmonary Disease

## 2013-07-27 VITALS — BP 152/90 | HR 71 | Temp 97.0°F | Ht 66.0 in | Wt 239.0 lb

## 2013-07-27 DIAGNOSIS — G4700 Insomnia, unspecified: Secondary | ICD-10-CM | POA: Insufficient documentation

## 2013-07-27 DIAGNOSIS — J84111 Idiopathic interstitial pneumonia, not otherwise specified: Secondary | ICD-10-CM

## 2013-07-27 DIAGNOSIS — J8410 Pulmonary fibrosis, unspecified: Secondary | ICD-10-CM

## 2013-07-27 DIAGNOSIS — R1013 Epigastric pain: Secondary | ICD-10-CM | POA: Insufficient documentation

## 2013-07-27 LAB — COMPREHENSIVE METABOLIC PANEL
ALK PHOS: 58 U/L (ref 39–117)
ALT: 23 U/L (ref 0–35)
AST: 15 U/L (ref 0–37)
Albumin: 4 g/dL (ref 3.5–5.2)
BUN: 19 mg/dL (ref 6–23)
CO2: 32 mEq/L (ref 19–32)
Calcium: 10.1 mg/dL (ref 8.4–10.5)
Chloride: 97 mEq/L (ref 96–112)
Creatinine, Ser: 0.8 mg/dL (ref 0.4–1.2)
GFR: 77.08 mL/min (ref >60.00)
Glucose, Bld: 208 mg/dL — ABNORMAL HIGH (ref 70–99)
POTASSIUM: 4.6 meq/L (ref 3.5–5.1)
SODIUM: 137 meq/L (ref 135–145)
TOTAL PROTEIN: 7.2 g/dL (ref 6.0–8.3)
Total Bilirubin: 0.7 mg/dL (ref 0.3–1.2)

## 2013-07-27 LAB — CBC WITH DIFFERENTIAL/PLATELET
BASOS ABS: 0 10*3/uL (ref 0.0–0.1)
BASOS PCT: 0.2 % (ref 0.0–3.0)
EOS ABS: 0 10*3/uL (ref 0.0–0.7)
Eosinophils Relative: 0.1 % (ref 0.0–5.0)
HCT: 42.2 % (ref 36.0–46.0)
Hemoglobin: 14.3 g/dL (ref 12.0–15.0)
LYMPHS PCT: 17.4 % (ref 12.0–46.0)
Lymphs Abs: 1.8 10*3/uL (ref 0.7–4.0)
MCHC: 33.9 g/dL (ref 30.0–36.0)
MCV: 91.8 fl (ref 78.0–100.0)
MONO ABS: 0.3 10*3/uL (ref 0.1–1.0)
Monocytes Relative: 3.2 % (ref 3.0–12.0)
Neutro Abs: 8.3 10*3/uL — ABNORMAL HIGH (ref 1.4–7.7)
Neutrophils Relative %: 79.1 % — ABNORMAL HIGH (ref 43.0–77.0)
PLATELETS: 269 10*3/uL (ref 150.0–400.0)
RBC: 4.6 Mil/uL (ref 3.87–5.11)
RDW: 15 % — AB (ref 11.5–14.6)
WBC: 10.5 10*3/uL (ref 4.5–10.5)

## 2013-07-27 LAB — LIPASE: LIPASE: 23 U/L (ref 11.0–59.0)

## 2013-07-27 MED ORDER — PANTOPRAZOLE SODIUM 40 MG PO TBEC: 40.0000 mg | DELAYED_RELEASE_TABLET | Freq: Two times a day (BID) | ORAL | Status: DC

## 2013-07-27 MED ORDER — SULFAMETHOXAZOLE-TRIMETHOPRIM 400-80 MG PO TABS: 1.0000 | ORAL_TABLET | Freq: Every day | ORAL | Status: DC

## 2013-07-27 MED ORDER — MYCOPHENOLATE MOFETIL 500 MG PO TABS: 500.0000 mg | ORAL_TABLET | Freq: Two times a day (BID) | ORAL | Status: DC

## 2013-07-27 MED ORDER — MYCOPHENOLATE MOFETIL 500 MG PO TABS
500.0000 mg | ORAL_TABLET | Freq: Two times a day (BID) | ORAL | Status: DC
Start: 2013-07-27 — End: 2013-07-29

## 2013-07-27 NOTE — Assessment & Plan Note (Signed)
She had some mild epigastric abdominal tenderness today, but no guarding or rebound.  She does not have a gallbladder.  I explained to her that this is probably gastritis but we will check for pancreatitis.  Plan: -check lipase -increase protonix to bid

## 2013-07-27 NOTE — Assessment & Plan Note (Addendum)
We had a lengthy conversation today in clinic with her and her daughters.  She has UIP on biopsy, but at this point I don't believe this is IPF because she has been steroid responsive in the last 3-4 months.  Serologies have not suggested a connective tissue disease.    Kaitlin Hamilton is really struggling with the side effects of the prednisone.  Clearly, she has had a clinical benefit with prednisone in the past and worsening with stopping it.  So we need to start steroid sparing agents.  She needs oxygen with sleep and occasionally notes hypoxemia with exertion on her home oximter at home when she does not wear O2 during the day.  Plan: -start Cellcept -wean prednisone to 10mg  daily after starting Cellcept -bactrim daily -monthly CBC, CMET -O2 2L with exertion and sleep -6MW next visit -PFT 6 months

## 2013-07-27 NOTE — Progress Notes (Signed)
Subjective:    Patient ID: Kaitlin Hamilton, female    DOB: [birth year], 63 y.o.   MRN: [id number]  Synopsis: Kaitlin Hamilton is a very pleasant 63 year old female who first saw the [facility] pulmonary clinic in March 2014 for evaluation of shortness of breath. She had significant cough, wheezing, and sputum production requiring multiple rounds of antibiotics. Because of crackles on lung exam and an abnormal pulmonary function test she was referred to [place]. We ordered full pulmonary function testing which showed moderate restriction and a depressed DLCO in proportion to her restriction. There is no airflow obstruction and no change with bronchodilator administration. A chest x-ray performed in February 2014 was read as normal.  HPI   11/16/2012 ROV -- Kaitlin Hamilton feels that her cough and dyspnea is better since our last visit.  She is still taking Advair twice a day and rarely has to use her albuterol inhaler. She did use albuterol over the weekend when she was cleaning out a closet that was full of a lot of dust and mold. This made her short of breath and had chest congestion. The albuterol helped significantly. She stated that when she use the albuterol for the pulmonary function test yesterday she felt significant improvement in her breathing. Otherwise she is doing very well and has no symptoms.  04/19/2013 ROV -- Kaitlin Hamilton has had a rough time since her last visit. Unfortunately she never had a blood work done nor the barium swallow which we recommended last time. Approximately one month ago she developed a cough with some sputum production. She was treated with Augmentin as well as prednisone. She was also started on a Dulera inhaler at some point in the last month. She said that the prednisone deathly made her feel better but then her cough returned not long after she stopped that medicine. She has had some shortness of breath with this lately. The [facility] has not made any difference that she can tell. However she  does feel that the [facility] is making her blood sugar elevated.  She has been producing green sputum lately, particularly in the last three days.  05/17/2013 ROV > Bryley feels like her breathing is better since the last visit.  She has been having ear pain for about 2-3 days in R ear. She has also noted some sinus symptoms since the last visit.   She continues to have a cough at night, but it is better overall and in the morning . When she bends over she has more cough.  She wonders if it is the carpet in her room. She typically works in the same room, but working in other rooms doesn't seem to make a difference in those.  Dyspnea has improved somewhat.    06/24/2013 ROV > Kaitlin Hamilton has had increased dyspnea, cough, and green mucus production ever since stopping the prednisone last week. No fevers or chills.  The dyspnea is mild.  She has not had chest pain or body aches, rash, or leg swelling.  No weight change.    She had an endoscopy which showed a Schatzki ring which was dilated by Dr. Hilarie Fredrickson.  She also had gastropathy which was H.Pylori positive.  07/27/2012 ROV >> Since surgery, she has only had two or three nights of good sleep.  The prednisone is really interfering with her sleep.  She is on a bad sleep cycle.  Last week she slept seven hours. Her family notes that she snores.    She is still having some dyspnea,  typically it is better on days when she gets more sleep. She has had some problem with dyspnea, not clear if worse since before surery or not.  She has had a hacking cough, but it has been mostly dry for the last few days.    She is taking protonix.  Past Medical History  Diagnosis Date  . Bronchitis   . Chicken pox   . Depression   . Allergic rhinitis     uses Flonase daily  . Hyperlipidemia     lost 43 pounds and no meds required at present  . Hypokalemia   . Anxiety   . PONV (postoperative nausea and vomiting)   . Hypertension     takes Verapamil and HCTZ daily  .  Shortness of breath     with exertion;takes Singulair daily as well as Flonase  . Pneumonia     last time in 2006  . History of bronchitis     2013  . H/O hiatal hernia   . Migraines     last one about a month ago  . Dysphagia   . Joint swelling     left thumb  . OA (osteoarthritis)     left knee  . GERD (gastroesophageal reflux disease)     takes Protonix daily  . Hemorrhoids   . History of colon polyps   . Diverticulosis   . Urinary frequency   . History of kidney stones   . Non-insulin dependent type 2 diabetes mellitus     takes Glipizide daily  . Constipation     takes Fiber daily  . Hypothyroidism (acquired)     takes Synthroid daily  . Insomnia     doesn't take any meds for this     Family History  Problem Relation Age of Onset  . Rheum arthritis Mother   . Rheum arthritis Sister   . Prostate cancer Brother   . Heart disease Maternal Grandmother   . Heart disease Maternal Grandfather   . Uterine cancer Daughter   . Colon cancer Neg Hx      History   Social History  . Marital Status: Widowed    Spouse Name: N/A    Number of Children: 2  . Years of Education: N/A   Occupational History  . Day Care at [employer]    Social History Main Topics  . Smoking status: Never Smoker   . Smokeless tobacco: Never Used  . Alcohol Use: No  . Drug Use: No  . Sexual Activity: Not on file   Other Topics Concern  . Not on file   Social History Narrative   Daily caffeine      Allergies  Allergen Reactions  . Codeine Hives and Swelling  . Demerol [Meperidine] Hives and Swelling     Outpatient Prescriptions Prior to Visit  Medication Sig Dispense Refill  . acetaminophen (TYLENOL) 325 MG tablet Take 650 mg by mouth every 6 (six) hours as needed for mild pain.      . chlorpheniramine-HYDROcodone (TUSSIONEX PENNKINETIC ER) 10-8 MG/5ML LQCR Take 5 mLs by mouth every 12 (twelve) hours as needed for cough.  120 mL  0  . FIBER PO Take 1 tablet by mouth daily.      .  fluticasone (FLONASE) 50 MCG/ACT nasal spray Place 2 sprays into the nose daily.       Marland Kitchen glipiZIDE (GLUCOTROL) 5 MG tablet Take 5 mg by mouth daily.      . hydrochlorothiazide (MICROZIDE) 12.5 MG  capsule Take 12.5 mg by mouth daily.      Marland Kitchen levothyroxine (SYNTHROID, LEVOTHROID) 125 MCG tablet Take 125 mcg by mouth daily before breakfast.      . montelukast (SINGULAIR) 10 MG tablet Take 10 mg by mouth at bedtime.      . predniSONE (DELTASONE) 20 MG tablet Take 2 tablets (40 mg total) by mouth daily with breakfast.  60 tablet  0  . traMADol (ULTRAM) 50 MG tablet Take by mouth every 6 (six) hours as needed.      . verapamil (VERELAN PM) 180 MG 24 hr capsule Take 180 mg by mouth daily.      Marland Kitchen zolpidem (AMBIEN) 5 MG tablet Take 1 tablet (5 mg total) by mouth at bedtime as needed for sleep.  30 tablet  0  . pantoprazole (PROTONIX) 40 MG tablet Take one tablet by mouth twice daily for 10 days while on PYLERA, then switch back to daily.  20 tablet  0  . bismuth-metronidazole-tetracycline (PYLERA) 140-125-125 MG per capsule Take 3 capsules by mouth 4 (four) times daily -  before meals and at bedtime.  120 capsule  0  . fluconazole (DIFLUCAN) 100 MG tablet Take 2 tabs day one then one tab daily for a total of 5 days  6 tablet  0   No facility-administered medications prior to visit.     Review of Systems  Constitutional: Positive for fatigue. Negative for fever and chills.  HENT: Positive for ear pain, nosebleeds, postnasal drip and rhinorrhea. Negative for congestion.   Respiratory: Negative for cough, shortness of breath and wheezing.   Cardiovascular: Negative for chest pain, palpitations and leg swelling.       Objective:   Physical Exam   Filed Vitals:   07/27/13 1351  BP: 152/90  Pulse: 71  Temp: 97 F (36.1 C)  TempSrc: Oral  Height: 5\' 6"  (1.676 m)  Weight: 239 lb (108.41 kg)  SpO2: 95%  Room Air  Gen: overweight, tearful, no acute distress HEENT: NCAT,  EOMi, OP clear,  PULM:  Crackles 1/2 way up bilaterally CV: RRR, no mgr, no JVD AB: BS+, soft, mild epigastric tenderness, no hsm Ext: warm, trace leg edema, no clubbing, no cyanosis  February 2014 simple spirometry performed by her primary care physician>> ratio 90%, FEV1 1.71 L (64% predicted, FVC 1.83 L 55% predicted; flow volume loop is not consistent with obstruction February 2014 chest x-ray at [facility] normal 10/19/2012 walked 500 feet in office on room air oxygenation did not drop below 90% 11/15/2012 Full PFT [name]> Ratio 87%, FEV1 2.00L > 2.07 with bronchodilator (3% change); TLC 3.44 L (65% pred), ERV 0.62 (57% pred), DLCO 15.1 ml/mmHg/min (59% pred) 06/23/2013 CXR > no infiltrate or significant change     Assessment & Plan:   Idiopathic interstitial pneumonia We had a lengthy conversation today in clinic with her and her daughters.  She has UIP on biopsy, but at this point I don't believe this is IPF because she has been steroid responsive in the last 3-4 months.  Serologies have not suggested a connective tissue disease.    Milanna is really struggling with the side effects of the prednisone.  Clearly, she has had a clinical benefit with prednisone in the past and worsening with stopping it.  So we need to start steroid sparing agents.  She needs oxygen with sleep and occasionally notes hypoxemia with exertion on her home oximter at home when she does not wear O2 during the day.  Plan: -start Cellcept -wean prednisone to 10mg  daily after starting Cellcept -bactrim daily -monthly CBC, CMET -O2 2L continuous and with sleep -6MW next visit -PFT 6 months    Insomnia I agree with her daughter that we need to consider sleep apnea given her obesity and difficulty sleeping.  Plan: -split night study  Abdominal pain, epigastric She had some mild epigastric abdominal tenderness today, but no guarding or rebound.  She does not have a gallbladder.  I explained to her that this is probably gastritis but we  will check for pancreatitis.  Plan: -check lipase -increase protonix to bid    Updated Medication List Outpatient Encounter Prescriptions as of 07/27/2013  Medication Sig  . acetaminophen (TYLENOL) 325 MG tablet Take 650 mg by mouth every 6 (six) hours as needed for mild pain.  . chlorpheniramine-HYDROcodone (TUSSIONEX PENNKINETIC ER) 10-8 MG/5ML LQCR Take 5 mLs by mouth every 12 (twelve) hours as needed for cough.  . FIBER PO Take 1 tablet by mouth daily.  . fluticasone (FLONASE) 50 MCG/ACT nasal spray Place 2 sprays into the nose daily.   Marland Kitchen glipiZIDE (GLUCOTROL) 5 MG tablet Take 5 mg by mouth daily.  . hydrochlorothiazide (MICROZIDE) 12.5 MG capsule Take 12.5 mg by mouth daily.  Marland Kitchen levothyroxine (SYNTHROID, LEVOTHROID) 125 MCG tablet Take 125 mcg by mouth daily before breakfast.  . montelukast (SINGULAIR) 10 MG tablet Take 10 mg by mouth at bedtime.  . pantoprazole (PROTONIX) 40 MG tablet daily. Take one tablet by mouth twice daily for 10 days while on PYLERA, then switch back to daily.  . predniSONE (DELTASONE) 20 MG tablet Take 2 tablets (40 mg total) by mouth daily with breakfast.  . traMADol (ULTRAM) 50 MG tablet Take by mouth every 6 (six) hours as needed.  . verapamil (VERELAN PM) 180 MG 24 hr capsule Take 180 mg by mouth daily.  Marland Kitchen zolpidem (AMBIEN) 5 MG tablet Take 1 tablet (5 mg total) by mouth at bedtime as needed for sleep.  . [DISCONTINUED] pantoprazole (PROTONIX) 40 MG tablet Take one tablet by mouth twice daily for 10 days while on PYLERA, then switch back to daily.  . [DISCONTINUED] bismuth-metronidazole-tetracycline (PYLERA) 140-125-125 MG per capsule Take 3 capsules by mouth 4 (four) times daily -  before meals and at bedtime.  . [DISCONTINUED] fluconazole (DIFLUCAN) 100 MG tablet Take 2 tabs day one then one tab daily for a total of 5 days

## 2013-07-27 NOTE — Assessment & Plan Note (Signed)
I agree with her daughter that we need to consider sleep apnea given her obesity and difficulty sleeping.  Plan: -split night study

## 2013-07-27 NOTE — Patient Instructions (Signed)
Start taking the protonix twice a day If your belly pain gets worse, call your primary care doctor  Start taking the cellcept 500mg  daily for two weeks After two weeks, decrease your prednisone to 10mg  daily and start taking Cellcept 500mg  twice a day  Two weeks after that, take 1000mg  twice a day of cellcept and 10mg  prednisone daily; maintain this dose until you see me next  Take bactrim one tablet every day  We will order a sleep study  We will see you back in 6 weeks or sooner if needed

## 2013-07-28 ENCOUNTER — Telehealth: Payer: Self-pay

## 2013-07-28 NOTE — Telephone Encounter (Signed)
Message copied by Len Blalock on Thu Jul 28, 2013 12:40 PM ------      Message from: Simonne Maffucci B      Created: Thu Jul 28, 2013  7:21 AM       A,            Please let her know her labs ere OK with the exception of a high prednisone which is due to the steroids.            Thanks      B ------

## 2013-07-28 NOTE — Telephone Encounter (Signed)
Relayed lab results to pt, she understands this.  I also clarified how to take her prednisone since she was concerned that she misunderstood BQ when he explained this.  Nothing further needed at this time. Caulfield,Ashley L

## 2013-07-29 ENCOUNTER — Telehealth: Payer: Self-pay | Admitting: Pulmonary Disease

## 2013-07-29 ENCOUNTER — Ambulatory Visit (INDEPENDENT_AMBULATORY_CARE_PROVIDER_SITE_OTHER): Payer: BC Managed Care – PPO | Admitting: Physician Assistant

## 2013-07-29 ENCOUNTER — Telehealth: Payer: Self-pay | Admitting: Internal Medicine

## 2013-07-29 ENCOUNTER — Encounter: Payer: Self-pay | Admitting: Internal Medicine

## 2013-07-29 ENCOUNTER — Telehealth: Payer: Self-pay

## 2013-07-29 ENCOUNTER — Other Ambulatory Visit: Payer: BC Managed Care – PPO

## 2013-07-29 ENCOUNTER — Encounter: Payer: Self-pay | Admitting: Physician Assistant

## 2013-07-29 ENCOUNTER — Ambulatory Visit (INDEPENDENT_AMBULATORY_CARE_PROVIDER_SITE_OTHER): Payer: BC Managed Care – PPO | Admitting: Internal Medicine

## 2013-07-29 VITALS — BP 164/72 | HR 90 | Wt 232.0 lb

## 2013-07-29 VITALS — BP 132/80 | HR 80 | Temp 97.6°F | Ht 66.0 in | Wt 242.0 lb

## 2013-07-29 DIAGNOSIS — R197 Diarrhea, unspecified: Secondary | ICD-10-CM

## 2013-07-29 DIAGNOSIS — J84111 Idiopathic interstitial pneumonia, not otherwise specified: Secondary | ICD-10-CM

## 2013-07-29 DIAGNOSIS — T887XXA Unspecified adverse effect of drug or medicament, initial encounter: Secondary | ICD-10-CM

## 2013-07-29 DIAGNOSIS — R198 Other specified symptoms and signs involving the digestive system and abdomen: Secondary | ICD-10-CM

## 2013-07-29 DIAGNOSIS — R1084 Generalized abdominal pain: Secondary | ICD-10-CM

## 2013-07-29 DIAGNOSIS — R194 Change in bowel habit: Secondary | ICD-10-CM

## 2013-07-29 DIAGNOSIS — T50905A Adverse effect of unspecified drugs, medicaments and biological substances, initial encounter: Secondary | ICD-10-CM

## 2013-07-29 MED ORDER — METRONIDAZOLE 250 MG PO TABS
ORAL_TABLET | ORAL | Status: DC
Start: 2013-07-29 — End: 2013-08-01

## 2013-07-29 MED ORDER — MYCOPHENOLATE MOFETIL 500 MG PO TABS: 500.0000 mg | ORAL_TABLET | Freq: Two times a day (BID) | ORAL | Status: DC

## 2013-07-29 MED ORDER — PANTOPRAZOLE SODIUM 40 MG PO TBEC: DELAYED_RELEASE_TABLET | ORAL | Status: DC

## 2013-07-29 MED ORDER — SACCHAROMYCES BOULARDII 250 MG PO CAPS: 250.0000 mg | ORAL_CAPSULE | Freq: Two times a day (BID) | ORAL | Status: DC

## 2013-07-29 NOTE — Progress Notes (Signed)
Subjective:    Patient ID: Kaitlin Hamilton, female    DOB: 02-02-1951, 63 y.o.   MRN: [id number]  Synopsis: Kaitlin Hamilton is a very pleasant 63 year old female who first saw the [facility] pulmonary clinic in March 2014 for evaluation of shortness of breath. She had significant cough, wheezing, and sputum production requiring multiple rounds of antibiotics. Because of crackles on lung exam and an abnormal pulmonary function test she was referred to [place]. We ordered full pulmonary function testing which showed moderate restriction and a depressed DLCO in proportion to her restriction. There is no airflow obstruction and no change with bronchodilator administration. A chest x-ray performed in February 2014 was read as normal.  HPI   11/16/2012 ROV -- Kaitlin Hamilton feels that her cough and dyspnea is better since our last visit.  She is still taking Advair twice a day and rarely has to use her albuterol inhaler. She did use albuterol over the weekend when she was cleaning out a closet that was full of a lot of dust and mold. This made her short of breath and had chest congestion. The albuterol helped significantly. She stated that when she use the albuterol for the pulmonary function test yesterday she felt significant improvement in her breathing. Otherwise she is doing very well and has no symptoms.  04/19/2013 ROV -- Kaitlin Hamilton has had a rough time since her last visit. Unfortunately she never had a blood work done nor the barium swallow which we recommended last time. Approximately one month ago she developed a cough with some sputum production. She was treated with Augmentin as well as prednisone. She was also started on a Dulera inhaler at some point in the last month. She said that the prednisone deathly made her feel better but then her cough returned not long after she stopped that medicine. She has had some shortness of breath with this lately. The [facility] has not made any difference that she can tell. However she  does feel that the [facility] is making her blood sugar elevated.  She has been producing green sputum lately, particularly in the last three days.  05/17/2013 ROV > Kaitlin Hamilton feels like her breathing is better since the last visit.  She has been having ear pain for about 2-3 days in R ear. She has also noted some sinus symptoms since the last visit.   She continues to have a cough at night, but it is better overall and in the morning . When she bends over she has more cough.  She wonders if it is the carpet in her room. She typically works in the same room, but working in other rooms doesn't seem to make a difference in those.  Dyspnea has improved somewhat.    06/24/2013 ROV > Kaitlin Hamilton has had increased dyspnea, cough, and green mucus production ever since stopping the prednisone last week. No fevers or chills.  The dyspnea is mild.  She has not had chest pain or body aches, rash, or leg swelling.  No weight change.    She had an endoscopy which showed a Schatzki ring which was dilated by Dr. Hilarie Fredrickson.  She also had gastropathy which was H.Pylori positive.  07/27/2012 ROV >> Since surgery, she has only had two or three nights of good sleep.  The prednisone is really interfering with her sleep.  She is on a bad sleep cycle.  Last week she slept seven hours. Her family notes that she snores.   She is still having some dyspnea, typically  it is better on days when she gets more sleep. She has had some problem with dyspnea, not clear if worse since before surery or not. She has had a hacking cough, but it has been mostly dry for the last few days.   She is taking protonix. rec Start taking the protonix twice a day If your belly pain gets worse, call your primary care doctor Start taking the cellcept 500mg  daily for two weeks After two weeks, decrease your prednisone to 10mg  daily and start taking Cellcept 500mg  twice a day Two weeks after that, take 1000mg  twice a day of cellcept and 10mg  prednisone daily; maintain  this dose until you see me next Take bactrim one tablet every day  07/29/2013 acute ov/Neven Fina re:  Chief Complaint  Patient presents with  . Acute Visit    Pt states started on bactrim and cellcept 07/28/13 and since then has developed itching and rash under arms and her chest.      07/29/2013 f/u ov/Kaitlin Hamilton re: rash on 40 mg prednisone Chief Complaint  Patient presents with  . Acute Visit    Pt states started on bactrim and cellcept 07/28/13 and since then has developed itching and rash under arms and her chest.   last dose of bactrim am 1/9 has not tried benadryl yet, no aches, increase sob over baseline or mouth/ throat complaints.  No obvious day to day or daytime variabilty or assoc   cp or chest tightness, subjective wheeze overt sinus or hb symptoms. No unusual exp hx or h/o childhood pna/ asthma or knowledge of premature birth.  Sleeping ok without nocturnal  or early am exacerbation  of respiratory  c/o's or need for noct saba. Also denies any obvious fluctuation of symptoms with weather or environmental changes or other aggravating or alleviating factors except as outlined above   Current Medications, Allergies, Complete Past Medical History, Past Surgical History, Family History, and Social History were reviewed in [employer] record.  ROS  The following are not active complaints unless bolded sore throat, dysphagia, dental problems, itching, sneezing,  nasal congestion or excess/ purulent secretions, ear ache,   fever, chills, sweats, unintended wt loss, pleuritic or exertional cp, hemoptysis,  orthopnea pnd or leg swelling, presyncope, palpitations, heartburn, abdominal pain, anorexia, nausea, vomiting, diarrhea  or change in bowel or urinary habits, change in stools or urine, dysuria,hematuria,  rash, arthralgias, visual complaints, headache, numbness weakness or ataxia or problems with walking or coordination,  change in mood/affect or memory.          Past  Medical History  Diagnosis Date  . Bronchitis   . Chicken pox   . Depression   . Allergic rhinitis     uses Flonase daily  . Hyperlipidemia     lost 43 pounds and no meds required at present  . Hypokalemia   . Anxiety   . PONV (postoperative nausea and vomiting)   . Hypertension     takes Verapamil and HCTZ daily  . Shortness of breath     with exertion;takes Singulair daily as well as Flonase  . Pneumonia     last time in 2006  . History of bronchitis     2013  . H/O hiatal hernia   . Migraines     last one about a month ago  . Dysphagia   . Joint swelling     left thumb  . OA (osteoarthritis)     left knee  . GERD (gastroesophageal  reflux disease)     takes Protonix daily  . Hemorrhoids   . History of colon polyps   . Diverticulosis   . Urinary frequency   . History of kidney stones   . Non-insulin dependent type 2 diabetes mellitus     takes Glipizide daily  . Constipation     takes Fiber daily  . Hypothyroidism (acquired)     takes Synthroid daily  . Insomnia     doesn't take any meds for this     Family History  Problem Relation Age of Onset  . Rheum arthritis Mother   . Rheum arthritis Sister   . Prostate cancer Brother   . Heart disease Maternal Grandmother   . Heart disease Maternal Grandfather   . Uterine cancer Daughter   . Colon cancer Neg Hx      History   Social History  . Marital Status: Widowed    Spouse Name: N/A    Number of Children: 2  . Years of Education: N/A   Occupational History  . Day Care at [employer]    Social History Main Topics  . Smoking status: Never Smoker   . Smokeless tobacco: Never Used  . Alcohol Use: No  . Drug Use: No  . Sexual Activity: Not on file   Other Topics Concern  . Not on file   Social History Narrative   Daily caffeine           Objective:   Physical Exam  Wt Readings from Last 3 Encounters:  07/29/13 232 lb (105.235 kg)  07/29/13 242 lb (109.77 kg)  07/27/13 239 lb (108.41 kg)       Gen: overweight,   no acute distress HEENT: NCAT,  EOMi, OP clear,  PULM: Crackles 1/2 way up bilaterally CV: RRR, no mgr, no JVD AB: BS+, soft, mild epigastric tenderness, no hsm Ext: warm, trace leg edema, no clubbing, no cyanosis Skin :    Erythematous macular patchy rash worse bilateral mid chest wall  February 2014 simple spirometry performed by her primary care physician>> ratio 90%, FEV1 1.71 L (64% predicted, FVC 1.83 L 55% predicted; flow volume loop is not consistent with obstruction February 2014 chest x-ray at [facility] normal 10/19/2012 walked 500 feet in office on room air oxygenation did not drop below 90% 11/15/2012 Full PFT [name]> Ratio 87%, FEV1 2.00L > 2.07 with bronchodilator (3% change); TLC 3.44 L (65% pred), ERV 0.62 (57% pred), DLCO 15.1 ml/mmHg/min (59% pred) 06/23/2013 CXR > no infiltrate or significant change     Assessment & Plan:

## 2013-07-29 NOTE — Telephone Encounter (Signed)
lmtcb x1 

## 2013-07-29 NOTE — Telephone Encounter (Signed)
Pt reports abdominal pain below her breastbone above her waist and numerous soft stools, she denies watery, x 4 days. She is very nervous, hasn't slept and has a decreased appetite. If she eats anything, she has a BM afterwards; she reports 4 BMs already this am. She states the stools are not foul smelling.\ Pt was + for H. Pylori and was tx with Pylera. She was just placed on Bactrim on 07/27/13 by pulmonology for interstitial lung disease. Before she took Pylera, she was on Levaquin. Pt has an appt with Dr Melvyn Novas this am and will see Nicoletta Ba, PA  Spoke with Amy and CDIFF ordered; pt aware.

## 2013-07-29 NOTE — Patient Instructions (Addendum)
Stop bactrim  Take benadryl 25 mg  1-2 every 4 hours as needed for itching   I will send this chart to Dr [name] for review

## 2013-07-29 NOTE — Progress Notes (Addendum)
Subjective:    Patient ID: Kaitlin Hamilton, female    DOB: 1951-05-17, 63 y.o.   MRN: [id number]  HPI Blazina a pleasant 63 year old female known to Dr. Hilarie Fredrickson with history of interstitial lung disease, diabetes, hyperlipidemia and GERD. She has recently undergone a thoracoscopy and biopsies which show a fibrosing lung injury and interstitial pneumonia. She has been on multiple courses of antibiotics over the past several months and has had several of prednisone. She is currently on 40 mg of prednisone daily. She was just started on Bactrim within the past week but apparently has had a reaction with rash and that was stopped today.  She was also just initiated on CellCept within the past couple of weeks. After she came out of the hospital from the lung biopsy she completed a course of time where a because of finding of positive H. Pylori with recent EGD and colon. She comes in today because she has developed acute change in bowel habits over the past 4 days. She says every time she eats she has to run to the bathroom and that everything she eats is running through her. She not sleeping well at night in part because she's having bowel movements during the night. She says she was up 6 times last night with bowel movements. Interestingly she's not having watery diarrhea but soft to loose stools. She has not noted any blood. No nausea or vomiting no fever or chills. She has developed upper abdominal crampy discomfort. She also says she still still sore on her right side in her right chest post thoracoscopy.    Review of Systems  Constitutional: Positive for appetite change.  HENT: Negative.   Eyes: Negative.   Respiratory: Positive for chest tightness and shortness of breath.   Cardiovascular: Negative.   Gastrointestinal: Positive for nausea, abdominal pain and diarrhea.  Endocrine: Negative.   Genitourinary: Negative.   Musculoskeletal: Negative.   Allergic/Immunologic: Negative.   Neurological:  Negative.   Hematological: Negative.   Psychiatric/Behavioral: Negative.    Outpatient Prescriptions Prior to Visit  Medication Sig Dispense Refill  . acetaminophen (TYLENOL) 325 MG tablet Take 650 mg by mouth every 6 (six) hours as needed for mild pain.      . chlorpheniramine-HYDROcodone (TUSSIONEX PENNKINETIC ER) 10-8 MG/5ML LQCR Take 5 mLs by mouth every 12 (twelve) hours as needed for cough.  120 mL  0  . FIBER PO Take 1 tablet by mouth daily.      . fluticasone (FLONASE) 50 MCG/ACT nasal spray Place 2 sprays into the nose daily.       Marland Kitchen glipiZIDE (GLUCOTROL) 5 MG tablet Take 5 mg by mouth daily.      . hydrochlorothiazide (MICROZIDE) 12.5 MG capsule Take 12.5 mg by mouth daily.      Marland Kitchen levothyroxine (SYNTHROID, LEVOTHROID) 125 MCG tablet Take 125 mcg by mouth daily before breakfast.      . montelukast (SINGULAIR) 10 MG tablet Take 10 mg by mouth at bedtime.      . mycophenolate (CELLCEPT) 500 MG tablet Take 1 tablet (500 mg total) by mouth 2 (two) times daily.  60 tablet  2  . predniSONE (DELTASONE) 20 MG tablet Take 2 tablets (40 mg total) by mouth daily with breakfast.  60 tablet  0  . traMADol (ULTRAM) 50 MG tablet Take by mouth every 6 (six) hours as needed.      . verapamil (VERELAN PM) 180 MG 24 hr capsule Take 180 mg by mouth daily.      Marland Kitchen  pantoprazole (PROTONIX) 40 MG tablet Take 1 tablet (40 mg total) by mouth 2 (two) times daily. Take one tablet by mouth twice daily for 10 days while on PYLERA, then switch back to daily.  60 tablet  2  . mycophenolate (CELLCEPT) 500 MG tablet Take 1 tablet (500 mg total) by mouth 2 (two) times daily.  60 tablet  2  . sulfamethoxazole-trimethoprim (BACTRIM) 400-80 MG per tablet Take 1 tablet by mouth daily.  30 tablet  5  . zolpidem (AMBIEN) 5 MG tablet Take 1 tablet (5 mg total) by mouth at bedtime as needed for sleep.  30 tablet  0   No facility-administered medications prior to visit.   Allergies  Allergen Reactions  . Codeine Hives and  Swelling  . Demerol [Meperidine] Hives and Swelling   Patient Active Problem List   Diagnosis Date Noted  . Insomnia 07/27/2013  . Abdominal pain, epigastric 07/27/2013  . Interstitial lung disease 06/29/2013  . GERD (gastroesophageal reflux disease) 06/01/2013  . Esophageal dysmotility 05/17/2013  . Otitis media 05/17/2013  . Idiopathic interstitial pneumonia 10/19/2012   History  Substance Use Topics  . Smoking status: Never Smoker   . Smokeless tobacco: Never Used  . Alcohol Use: No   family history includes Heart disease in her maternal grandfather and maternal grandmother; Prostate cancer in her brother; Rheum arthritis in her mother and sister; Uterine cancer in her daughter. There is no history of Colon cancer.     Objective:   Physical Exam  And and and and distress blood pressure 164/72 pulse 90 weight 232. HEENT; nontraumatic normocephalic EOMI PERRLA sclera anicteric, Supple;no JVD, Cardiovascular; regular rate and rhythm with S1-S2 no murmur or gallop, Pulmonary; somewhat decreased breath sounds bilaterally, Abdomen soft she is mild rather generalized tenderness no guarding no rebound no palpable mass or hepatomegaly bowel sounds are active, Rectal; exam not done, Extremities; no clubbing cyanosis or edema skin warm and dry, Psych ;mood and affect appropriate        Assessment & Plan:  #75  63 year old female with four-day history of abdominal cramping and change in bowel habits with multiple loose stools per day. High suspicion for C. Difficile colitis given recent steroid use in multiple recent course of antibiotics. #2 interstitial lung disease #3 diabetes mellitus #4 hypertension #5 history of H. Pyloric gastritis treatment completed #6 chronic GERD currently stable on Protonix 40 mg by mouth daily #7 severe diverticulosis #8 tubular adenomas and hyperplastic polyps on recent colonoscopy  Plan; check stool for C. Difficile by PCR today Start Bentyl 10 mg 3-4  times daily as needed for cramping Start empiric Flagyl 250 mg by mouth 4 times daily x14 days Florastor one by mouth twice daily x3 weeks Soft low roughage diet until symptomatically improved  Addendum: Reviewed and agree with initial management. Jerene Bears, MD

## 2013-07-29 NOTE — Telephone Encounter (Signed)
Pt has an appt with MW today. Buckingham Bing, CMA

## 2013-07-29 NOTE — Telephone Encounter (Signed)
Pt's daughter had given [place] FMLA paperwork for herself to take time off work to help with her mother.  After speaking to [employer], I've learned that [facility] takes care of all of our FMLA papers.  Since I will be in [place] M-W, [place] will send this paper downstairs to [facility] on Monday (due to time today).  [facility] will contact the pt's daughter when her FMLA is done to make her aware of it's completion.  Pt and pt's daughter are both aware.

## 2013-07-29 NOTE — Patient Instructions (Signed)
We sent prescriptions to [facility] in [place], [place]. 1. Florastor 2. Flagyl ( Metronidazole )  3. Protonix 40 mg 1 daily  Soft bland diet for now.

## 2013-07-29 NOTE — Telephone Encounter (Signed)
Recently started on Cellcept and Bactrim.  This morning reports chest / arms rash and blister on the lip.  Otherwise no oral lesions. No sore throat. No problems swallowing.  No difficulty breathing in excess of baseline.  Kaitlin Hamilton?  Recommended stop both Rx, call office this am to get evaluated.

## 2013-07-31 ENCOUNTER — Telehealth: Payer: Self-pay | Admitting: Pulmonary Disease

## 2013-07-31 ENCOUNTER — Encounter: Payer: Self-pay | Admitting: Internal Medicine

## 2013-07-31 ENCOUNTER — Telehealth: Payer: Self-pay | Admitting: Gastroenterology

## 2013-07-31 ENCOUNTER — Telehealth: Payer: Self-pay | Admitting: Internal Medicine

## 2013-07-31 DIAGNOSIS — T50905A Adverse effect of unspecified drugs, medicaments and biological substances, initial encounter: Secondary | ICD-10-CM | POA: Insufficient documentation

## 2013-07-31 NOTE — Assessment & Plan Note (Signed)
February 2014 simple spirometry performed by her primary care physician>> ratio 90%, FEV1 1.71 L (64% predicted, FVC 1.83 L 55% predicted; flow volume loop is not consistent with obstruction February 2014 chest x-ray at [facility] normal 10/19/2012 walked 500 feet in office on room air oxygenation did not drop below 90% 11/15/2012 Full PFT [name]> Ratio 87%, FEV1 2.00L > 2.07 with bronchodilator (3% change); TLC 3.44 L (65% pred), ERV 0.62 (57% pred), DLCO 15.1 ml/mmHg/min (59% pred) 11/2012 CT chest >> ([facility] read) centrilobular nodules, interlobular septal thickening worse in bases and periphery R lung > L; some GGO in bases and periphery as well, some bronchiectasis in the bases R > L; findings suggestive of fibrosis but not UIP; question hypersensitivity pneumonitis given centrilobular nodules; also question aspiration 03/2013 ANA, ANCA, Anti-Jo-1, ESR, RF, SCL-70, anti-centromere, SSA/SSB, all negative; CRP 0.6;  04/2013 Barium swallow> abnormal esophageal motility, GERD, hiatal hernia 04/2013 Full PFT> Ratio 93%, FEV1 1.92 L (71% pred), TLC 3.02L (56% pred), DLCO 15.94 (59% pred) 04/2013 CT chest ([name])> findings suggestive of but not diagnostic of NSIP, small pulmonary nodule 04/2013 6MW RA > 1100 feet, HR peak 109, O2 sat Nadir 87% 04/2013 HP panel >> 05/17/2013 3 month prednisone trial 07/28/13 Cell cept and bactrim started > bactrim stopped 07/29/13 due to rash

## 2013-07-31 NOTE — Telephone Encounter (Signed)
On call- C/o burning/ itching rash after starting cellcept and re-exposure to bactrim. Taking prednisone 40 qd, benadryl. Stopped bactrim 1/9 when seen office/ Dr Melvyn Novas but continues cellcept. Imp- Too soon to abandon sulfa reaction as cause of rash. To use Aveeno, prednisone, benadryl today. Call office tomorrow. If no better by then may have to stop cellcept.

## 2013-07-31 NOTE — Assessment & Plan Note (Addendum)
Rash onset 07/29/13 p starting bactrim 1/8 and h/o previous exposure to sulfa but not cell cept    So most likely this is an early sulfa rash so rec a trial off for now and regroup with Dr [name] - call if rash worsens at all over next week because cellcept is the other suspect

## 2013-07-31 NOTE — Telephone Encounter (Signed)
She called today because rash is getting worse.  Rash started the day after she began bactrim.  I directed her to call back Dr. Cora Collum office (he started the bactrim).

## 2013-07-31 NOTE — Telephone Encounter (Signed)
Patient called in again for worsening rash.  She also has been having abd pain.  She has seen GI and she is on treatment for C diff.  She is on prednisone and benadryl.  Recently started on bactrim and cellcept for pulm fibrosis.  Bactrim held.  Given worsening rash will hold cellcept for now.  Continue treatment for abd pain as per GI.  Patient to call in am to schedule appt with Dr [name].  She will report to the ED if she continues to decline.

## 2013-08-01 ENCOUNTER — Encounter (HOSPITAL_COMMUNITY): Payer: Self-pay

## 2013-08-01 ENCOUNTER — Observation Stay (HOSPITAL_COMMUNITY): Payer: BC Managed Care – PPO

## 2013-08-01 ENCOUNTER — Ambulatory Visit (INDEPENDENT_AMBULATORY_CARE_PROVIDER_SITE_OTHER): Payer: BC Managed Care – PPO | Admitting: Pulmonary Disease

## 2013-08-01 ENCOUNTER — Telehealth: Payer: Self-pay

## 2013-08-01 ENCOUNTER — Encounter: Payer: Self-pay | Admitting: Pulmonary Disease

## 2013-08-01 ENCOUNTER — Inpatient Hospital Stay (HOSPITAL_COMMUNITY)
Admission: AD | Admit: 2013-08-01 | Discharge: 2013-08-03 | DRG: 917 | Disposition: A | Discharge status: Home Health | Payer: BC Managed Care – PPO | Source: Ambulatory Visit | Attending: Internal Medicine | Admitting: Internal Medicine

## 2013-08-01 VITALS — BP 148/92 | HR 75 | Temp 97.5°F | Wt 240.0 lb

## 2013-08-01 DIAGNOSIS — G9340 Encephalopathy, unspecified: Secondary | ICD-10-CM

## 2013-08-01 DIAGNOSIS — T887XXA Unspecified adverse effect of drug or medicament, initial encounter: Principal | ICD-10-CM | POA: Diagnosis present

## 2013-08-01 DIAGNOSIS — T50905S Adverse effect of unspecified drugs, medicaments and biological substances, sequela: Secondary | ICD-10-CM

## 2013-08-01 DIAGNOSIS — R112 Nausea with vomiting, unspecified: Secondary | ICD-10-CM | POA: Diagnosis present

## 2013-08-01 DIAGNOSIS — R51 Headache: Secondary | ICD-10-CM | POA: Diagnosis present

## 2013-08-01 DIAGNOSIS — I10 Essential (primary) hypertension: Secondary | ICD-10-CM | POA: Diagnosis present

## 2013-08-01 DIAGNOSIS — E119 Type 2 diabetes mellitus without complications: Secondary | ICD-10-CM | POA: Diagnosis present

## 2013-08-01 DIAGNOSIS — G4700 Insomnia, unspecified: Secondary | ICD-10-CM | POA: Diagnosis present

## 2013-08-01 DIAGNOSIS — H538 Other visual disturbances: Secondary | ICD-10-CM | POA: Diagnosis present

## 2013-08-01 DIAGNOSIS — R1013 Epigastric pain: Secondary | ICD-10-CM

## 2013-08-01 DIAGNOSIS — T370X5A Adverse effect of sulfonamides, initial encounter: Secondary | ICD-10-CM | POA: Diagnosis present

## 2013-08-01 DIAGNOSIS — J84112 Idiopathic pulmonary fibrosis: Secondary | ICD-10-CM | POA: Diagnosis present

## 2013-08-01 DIAGNOSIS — K219 Gastro-esophageal reflux disease without esophagitis: Secondary | ICD-10-CM | POA: Diagnosis present

## 2013-08-01 DIAGNOSIS — T50905A Adverse effect of unspecified drugs, medicaments and biological substances, initial encounter: Secondary | ICD-10-CM

## 2013-08-01 DIAGNOSIS — J849 Interstitial pulmonary disease, unspecified: Secondary | ICD-10-CM

## 2013-08-01 DIAGNOSIS — Z66 Do not resuscitate: Secondary | ICD-10-CM | POA: Diagnosis present

## 2013-08-01 DIAGNOSIS — G9341 Metabolic encephalopathy: Secondary | ICD-10-CM | POA: Diagnosis present

## 2013-08-01 DIAGNOSIS — K2990 Gastroduodenitis, unspecified, without bleeding: Secondary | ICD-10-CM

## 2013-08-01 DIAGNOSIS — K2970 Gastritis, unspecified, without bleeding: Secondary | ICD-10-CM | POA: Diagnosis present

## 2013-08-01 DIAGNOSIS — J84111 Idiopathic interstitial pneumonia, not otherwise specified: Secondary | ICD-10-CM

## 2013-08-01 DIAGNOSIS — K5900 Constipation, unspecified: Secondary | ICD-10-CM | POA: Diagnosis present

## 2013-08-01 DIAGNOSIS — L270 Generalized skin eruption due to drugs and medicaments taken internally: Secondary | ICD-10-CM | POA: Diagnosis present

## 2013-08-01 DIAGNOSIS — J8410 Pulmonary fibrosis, unspecified: Secondary | ICD-10-CM

## 2013-08-01 LAB — RPR: RPR Ser Ql: NONREACTIVE

## 2013-08-01 LAB — URINALYSIS, ROUTINE W REFLEX MICROSCOPIC
Bilirubin Urine: NEGATIVE
Hgb urine dipstick: NEGATIVE
KETONES UR: NEGATIVE mg/dL
LEUKOCYTES UA: NEGATIVE
Nitrite: NEGATIVE
PH: 7 (ref 5.0–8.0)
Protein, ur: 30 mg/dL — AB
SPECIFIC GRAVITY, URINE: 1.026 (ref 1.005–1.030)
Urobilinogen, UA: 0.2 mg/dL (ref 0.0–1.0)

## 2013-08-01 LAB — COMPREHENSIVE METABOLIC PANEL
ALBUMIN: 3.9 g/dL (ref 3.5–5.2)
ALT: 22 U/L (ref 0–35)
AST: 15 U/L (ref 0–37)
Alkaline Phosphatase: 67 U/L (ref 39–117)
BUN: 15 mg/dL (ref 6–23)
CALCIUM: 10 mg/dL (ref 8.4–10.5)
CO2: 30 meq/L (ref 19–32)
Chloride: 97 mEq/L (ref 96–112)
Creatinine, Ser: 0.71 mg/dL (ref 0.50–1.10)
GFR calc Af Amer: >90 mL/min (ref >90)
Glucose, Bld: 248 mg/dL — ABNORMAL HIGH (ref 70–99)
Potassium: 4.5 mEq/L (ref 3.7–5.3)
SODIUM: 139 meq/L (ref 137–147)
Total Bilirubin: 0.5 mg/dL (ref 0.3–1.2)
Total Protein: 7.1 g/dL (ref 6.0–8.3)

## 2013-08-01 LAB — CBC WITH DIFFERENTIAL/PLATELET
BASOS ABS: 0 10*3/uL (ref 0.0–0.1)
BASOS PCT: 0 % (ref 0–1)
EOS PCT: 1 % (ref 0–5)
Eosinophils Absolute: 0.1 10*3/uL (ref 0.0–0.7)
HCT: 44.8 % (ref 36.0–46.0)
Hemoglobin: 14.6 g/dL (ref 12.0–15.0)
Lymphocytes Relative: 15 % (ref 12–46)
Lymphs Abs: 1.4 10*3/uL (ref 0.7–4.0)
MCH: 31.2 pg (ref 26.0–34.0)
MCHC: 32.6 g/dL (ref 30.0–36.0)
MCV: 95.7 fL (ref 78.0–100.0)
Monocytes Absolute: 0.2 10*3/uL (ref 0.1–1.0)
Monocytes Relative: 2 % — ABNORMAL LOW (ref 3–12)
NEUTROS ABS: 7.5 10*3/uL (ref 1.7–7.7)
Neutrophils Relative %: 82 % — ABNORMAL HIGH (ref 43–77)
PLATELETS: 275 10*3/uL (ref 150–400)
RBC: 4.68 MIL/uL (ref 3.87–5.11)
RDW: 14.1 % (ref 11.5–15.5)
WBC: 9.2 10*3/uL (ref 4.0–10.5)

## 2013-08-01 LAB — GLUCOSE, CAPILLARY
GLUCOSE-CAPILLARY: 235 mg/dL — AB (ref 70–99)
GLUCOSE-CAPILLARY: 237 mg/dL — AB (ref 70–99)

## 2013-08-01 LAB — PHOSPHORUS: PHOSPHORUS: 3.6 mg/dL (ref 2.3–4.6)

## 2013-08-01 LAB — CLOSTRIDIUM DIFFICILE BY PCR: CDIFFPCR: NOT DETECTED

## 2013-08-01 LAB — URINE MICROSCOPIC-ADD ON

## 2013-08-01 LAB — MAGNESIUM: Magnesium: 2.1 mg/dL (ref 1.5–2.5)

## 2013-08-01 LAB — AMMONIA: AMMONIA: 25 umol/L (ref 11–60)

## 2013-08-01 MED ORDER — ACETAMINOPHEN 650 MG RE SUPP
650.0000 mg | Freq: Four times a day (QID) | RECTAL | Status: DC | PRN
  Administered 2013-08-01: 650 mg via RECTAL
  Filled 2013-08-01: qty 1

## 2013-08-01 MED ORDER — POLYVINYL ALCOHOL 1.4 % OP SOLN
1.0000 [drp] | OPHTHALMIC | Status: DC | PRN
Start: 2013-08-01 — End: 2013-08-03
  Administered 2013-08-01: 1 [drp] via OPHTHALMIC
  Filled 2013-08-01: qty 15

## 2013-08-01 MED ORDER — ONDANSETRON HCL 4 MG PO TABS: 4.0000 mg | ORAL_TABLET | Freq: Four times a day (QID) | ORAL | Status: DC | PRN

## 2013-08-01 MED ORDER — PREDNISONE 10 MG PO TABS: 10.0000 mg | ORAL_TABLET | Freq: Every day | ORAL | Status: DC

## 2013-08-01 MED ORDER — SODIUM CHLORIDE 0.9 % IV SOLN
INTRAVENOUS | Status: DC
Start: 2013-08-01 — End: 2013-08-03
  Administered 2013-08-01 – 2013-08-03 (×5): via INTRAVENOUS

## 2013-08-01 MED ORDER — ACETAMINOPHEN 325 MG PO TABS
650.0000 mg | ORAL_TABLET | Freq: Four times a day (QID) | ORAL | Status: DC | PRN
  Administered 2013-08-02 – 2013-08-03 (×4): 650 mg via ORAL
  Filled 2013-08-01 (×4): qty 2

## 2013-08-01 MED ORDER — VERAPAMIL HCL ER 180 MG PO TBCR
180.0000 mg | EXTENDED_RELEASE_TABLET | Freq: Every day | ORAL | Status: DC
  Administered 2013-08-01 – 2013-08-03 (×3): 180 mg via ORAL
  Filled 2013-08-01 (×3): qty 1

## 2013-08-01 MED ORDER — TRAMADOL HCL 50 MG PO TABS
50.0000 mg | ORAL_TABLET | Freq: Four times a day (QID) | ORAL | Status: DC | PRN
  Administered 2013-08-01 – 2013-08-03 (×5): 50 mg via ORAL
  Filled 2013-08-01 (×5): qty 1

## 2013-08-01 MED ORDER — MONTELUKAST SODIUM 10 MG PO TABS
10.0000 mg | ORAL_TABLET | Freq: Every day | ORAL | Status: DC
  Administered 2013-08-01 – 2013-08-02 (×2): 10 mg via ORAL
  Filled 2013-08-01 (×3): qty 1

## 2013-08-01 MED ORDER — ATOVAQUONE 750 MG/5ML PO SUSP: 1500.0000 mg | Freq: Every day | ORAL | Status: DC

## 2013-08-01 MED ORDER — ONDANSETRON HCL 4 MG/2ML IJ SOLN: 4.0000 mg | Freq: Four times a day (QID) | INTRAMUSCULAR | Status: DC | PRN

## 2013-08-01 MED ORDER — HEPARIN SODIUM (PORCINE) 5000 UNIT/ML IJ SOLN
5000.0000 [IU] | Freq: Three times a day (TID) | INTRAMUSCULAR | Status: DC
  Administered 2013-08-01 – 2013-08-03 (×5): 5000 [IU] via SUBCUTANEOUS
  Filled 2013-08-01 (×8): qty 1

## 2013-08-01 MED ORDER — HYDROCHLOROTHIAZIDE 12.5 MG PO CAPS
12.5000 mg | ORAL_CAPSULE | Freq: Every day | ORAL | Status: DC
  Administered 2013-08-01 – 2013-08-03 (×2): 12.5 mg via ORAL
  Filled 2013-08-01 (×3): qty 1

## 2013-08-01 MED ORDER — HYDROCOD POLST-CHLORPHEN POLST 10-8 MG/5ML PO LQCR
5.0000 mL | Freq: Two times a day (BID) | ORAL | Status: DC | PRN
  Administered 2013-08-02: 5 mL via ORAL
  Filled 2013-08-01: qty 5

## 2013-08-01 MED ORDER — MYCOPHENOLATE MOFETIL 250 MG PO CAPS: 500.0000 mg | ORAL_CAPSULE | Freq: Two times a day (BID) | ORAL | Status: DC

## 2013-08-01 MED ORDER — PREDNISONE 20 MG PO TABS
20.0000 mg | ORAL_TABLET | Freq: Every day | ORAL | Status: DC
  Filled 2013-08-01: qty 1

## 2013-08-01 MED ORDER — MYCOPHENOLATE MOFETIL 250 MG PO CAPS: 500.0000 mg | ORAL_CAPSULE | Freq: Every day | ORAL | Status: DC

## 2013-08-01 MED ORDER — PANTOPRAZOLE SODIUM 40 MG PO TBEC
40.0000 mg | DELAYED_RELEASE_TABLET | Freq: Two times a day (BID) | ORAL | Status: DC
  Administered 2013-08-01 – 2013-08-02 (×2): 40 mg via ORAL
  Filled 2013-08-01 (×3): qty 1

## 2013-08-01 MED ORDER — INSULIN ASPART 100 UNIT/ML ~~LOC~~ SOLN
0.0000 [IU] | Freq: Three times a day (TID) | SUBCUTANEOUS | Status: DC
  Administered 2013-08-01: 5 [IU] via SUBCUTANEOUS
  Administered 2013-08-02: 3 [IU] via SUBCUTANEOUS
  Administered 2013-08-02: 2 [IU] via SUBCUTANEOUS
  Administered 2013-08-02: 3 [IU] via SUBCUTANEOUS
  Administered 2013-08-03: 2 [IU] via SUBCUTANEOUS
  Administered 2013-08-03: 3 [IU] via SUBCUTANEOUS

## 2013-08-01 MED ORDER — INSULIN ASPART 100 UNIT/ML ~~LOC~~ SOLN
0.0000 [IU] | Freq: Every day | SUBCUTANEOUS | Status: DC
  Administered 2013-08-01: 2 [IU] via SUBCUTANEOUS

## 2013-08-01 MED ORDER — LEVOTHYROXINE SODIUM 125 MCG PO TABS
125.0000 ug | ORAL_TABLET | Freq: Every day | ORAL | Status: DC
  Administered 2013-08-02 – 2013-08-03 (×2): 125 ug via ORAL
  Filled 2013-08-01 (×3): qty 1

## 2013-08-01 MED ORDER — ATOVAQUONE 750 MG/5ML PO SUSP
1500.0000 mg | Freq: Every day | ORAL | Status: DC
  Filled 2013-08-01: qty 10

## 2013-08-01 MED ORDER — FLUTICASONE PROPIONATE 50 MCG/ACT NA SUSP
2.0000 | Freq: Every day | NASAL | Status: DC
  Administered 2013-08-02 – 2013-08-03 (×2): 2 via NASAL
  Filled 2013-08-01: qty 16

## 2013-08-01 NOTE — Progress Notes (Signed)
From: Dr. [name]  Story: 63yo with a hx of pneumonitis, steroid responsive. Presents to office with intractable n/v, recent gastritis, headache, encephalopathy, not eating for several days. IVF, advance diet, wean steroids. Recently admitted for VATs one month ago.  Accepted to med-surg. Tentative plan: wean steroids, cont on IVF, w/u n/v and encephalopathy.

## 2013-08-01 NOTE — Assessment & Plan Note (Signed)
She has a rash on her chest which I think that is due to the bactrim, strange that we didn't see this when she was on the bactrim before.  Plan: -change bactrim to atovaquone for PCP prophylaxis

## 2013-08-01 NOTE — Assessment & Plan Note (Signed)
She has steroid responsive UIP and her disease tends to flare when she is off of steroids.  However, she has had a severe reaction to prednisone 40mg  daily with acute encephalopathy.  She needs a break from steroids for a few days.  Plan: -quickly wean prednisone (20mg  daily today, 10mg  daily tomorrow, then off until January 16) -stop cellcept for now -stop bactrim for PCP prophylaxis  On August 05, 2013: -start prednisone 10mg  daily -start cellcept 500mg  daily for one week, then on January 23, increase to twice a day -start Atovaquone 1500mg  daily for PCP prophylaxis -cbc, CMET in one week  Continue O2 at night, need ambulatory O2 saturation check next visit

## 2013-08-01 NOTE — Assessment & Plan Note (Addendum)
She has steroid responsive UIP and her disease tends to flare when she is off of steroids.  However, she has had a severe reaction to prednisone 40mg daily with acute encephalopathy.  She needs a break from steroids for a few days.  Plan: -quickly wean prednisone (20mg daily today, 10mg daily tomorrow, then off until January 16) -stop cellcept for now -stop bactrim for PCP prophylaxis  On August 05, 2013: -start prednisone 10mg daily -start cellcept 500mg daily for one week, then on January 23, increase to twice a day -start Atovaquone 1500mg daily for PCP prophylaxis -cbc, CMET in one week  Continue O2 at night, need ambulatory O2 saturation check next visit 

## 2013-08-01 NOTE — Assessment & Plan Note (Signed)
Kaitlin Hamilton is profoundly confused.  I think this is primarily due to the effect of her prednisone, but she needs admission for a lab work up.  Plan: -stop prednisone (quick taper off, 20mg  today, 10mg  tomorrow, then hold until Jan 16) -admission for observation and work up per hospitalist service

## 2013-08-01 NOTE — H&P (Addendum)
[facility] History and Physical  Kaitlin Hamilton FYB:[id number] DOB: 05/20/1951 DOA: 08/01/2013  Referring physician: Dr. [name] PCP: [name], MD   Chief Complaint: Confusion  HPI: Kaitlin Hamilton is a 63 y.o. female  With history of interstitial pneumonitis currently followed by Dr. [name]. Reportedly had VATS last month for evaluation and biopsy. At that point was diagnosed with UIP. Since then patient has been on prednisone and was recently started on CellCept and Bactrim. History is obtained from family at bedside as patient is confused and unable to provide much of the history. Family is currently denying any recent increase in shortness of breath or fevers. Of note patient recently had endoscopy by Dr. Hilarie Fredrickson which showed a Schatzki ring and patient was H. pylori positive.  Family states the patient has also been complaining of abdominal discomfort with intermittent nausea. As a result has had poor oral intake.  Given recent altered mental status specialist recommended hospital admission for further evaluation and recommendations.   Review of Systems:  Unable to accurately assess due to confusion  Past Medical History  Diagnosis Date  . Bronchitis   . Chicken pox   . Depression   . Allergic rhinitis     uses Flonase daily  . Hyperlipidemia     lost 43 pounds and no meds required at present  . Hypokalemia   . Anxiety   . PONV (postoperative nausea and vomiting)   . Hypertension     takes Verapamil and HCTZ daily  . Shortness of breath     with exertion;takes Singulair daily as well as Flonase  . Pneumonia     last time in 2006  . History of bronchitis     2013  . H/O hiatal hernia   . Migraines     last one about a month ago  . Dysphagia   . Joint swelling     left thumb  . OA (osteoarthritis)     left knee  . GERD (gastroesophageal reflux disease)     takes Protonix daily  . Hemorrhoids   . History of colon polyps   . Diverticulosis   . Urinary frequency    . History of kidney stones   . Non-insulin dependent type 2 diabetes mellitus     takes Glipizide daily  . Constipation     takes Fiber daily  . Hypothyroidism (acquired)     takes Synthroid daily  . Insomnia     doesn't take any meds for this   Past Surgical History  Procedure Laterality Date  . Tubal ligation    . Cholecystectomy    . Appendectomy    . Abdominal exploration surgery      For Ovarian Cyst   . Left knee arthroscopy    . Esophagogastroduodenoscopy    . Tcs    . Lithotripsy      x 2  . Video assisted thoracoscopy Right 06/29/2013    Procedure: VIDEO ASSISTED THORACOSCOPY;  Surgeon: Melrose Nakayama, MD;  Location: [place];  Service: Thoracic;  Laterality: Right;  . Lung biopsy Right 06/29/2013    Procedure: LUNG BIOPSY;  Surgeon: Melrose Nakayama, MD;  Location: [place];  Service: Thoracic;  Laterality: Right;   Social History:  reports that she has never smoked. She has never used smokeless tobacco. She reports that she does not drink alcohol or use illicit drugs.  Allergies  Allergen Reactions  . Codeine Hives and Swelling  . Demerol [Meperidine] Hives and Swelling  Family History  Problem Relation Age of Onset  . Rheum arthritis Mother   . Rheum arthritis Sister   . Prostate cancer Brother   . Heart disease Maternal Grandmother   . Heart disease Maternal Grandfather   . Uterine cancer Daughter   . Colon cancer Neg Hx      Prior to Admission medications   Medication Sig Start Date End Date Taking? Authorizing Provider  acetaminophen (TYLENOL) 325 MG tablet Take 650 mg by mouth every 6 (six) hours as needed for mild pain.    Historical Provider, MD  atovaquone (MEPRON) 750 MG/5ML suspension Take 10 mLs (1,500 mg total) by mouth daily with breakfast. 08/01/13   Juanito Doom, MD  chlorpheniramine-HYDROcodone (TUSSIONEX PENNKINETIC ER) 10-8 MG/5ML LQCR Take 5 mLs by mouth every 12 (twelve) hours as needed for cough. 07/19/13   Noralee Space,  MD  FIBER PO Take 1 tablet by mouth daily.    Historical Provider, MD  fluticasone (FLONASE) 50 MCG/ACT nasal spray Place 2 sprays into the nose daily.     Historical Provider, MD  glipiZIDE (GLUCOTROL) 5 MG tablet Take 5 mg by mouth daily.    Historical Provider, MD  hydrochlorothiazide (MICROZIDE) 12.5 MG capsule Take 12.5 mg by mouth daily.    Historical Provider, MD  levothyroxine (SYNTHROID, LEVOTHROID) 125 MCG tablet Take 125 mcg by mouth daily before breakfast.    Historical Provider, MD  montelukast (SINGULAIR) 10 MG tablet Take 10 mg by mouth at bedtime.    Historical Provider, MD  mycophenolate (CELLCEPT) 500 MG tablet Take 1 tablet (500 mg total) by mouth 2 (two) times daily. 07/29/13   Tanda Rockers, MD  pantoprazole (PROTONIX) 40 MG tablet Take 1 tab in the am daily. 07/29/13   Amy S Esterwood, PA-C  predniSONE (DELTASONE) 10 MG tablet Take 1 tablet (10 mg total) by mouth daily with breakfast. 08/05/13   Juanito Doom, MD  traMADol (ULTRAM) 50 MG tablet Take by mouth every 6 (six) hours as needed.    Historical Provider, MD  verapamil (VERELAN PM) 180 MG 24 hr capsule Take 180 mg by mouth daily.    Historical Provider, MD   Physical Exam: Filed Vitals:   08/01/13 1423  BP: 150/86  Pulse: 68  Temp: 98.5 F (36.9 C)  Resp: 20    BP 150/86  Pulse 68  Temp(Src) 98.5 F (36.9 C) (Oral)  Resp 20  Ht 5\' 6"  (1.676 m)  Wt 107.3 kg (236 lb 8.9 oz)  BMI 38.20 kg/m2  SpO2 96%  General:  In NAD, Alert and Awake Eyes: PERRL, normal lids, irises & conjunctiva ENT: grossly normal hearing, lips & tongue, dry mucous membranes Neck: no LAD, masses or thyromegaly, supple Cardiovascular: RRR, no m/r/g. No LE edema. Telemetry: SR, no arrhythmias  Respiratory: Breathing comfortably on room air, speaking in full sentences, no wheezes. Difficult exam due to limited patient cooperation Abdomen: Positive bowel sounds. The patient did not allow me to examine her Skin: No obvious skin rash.  Limited cooperation on exam as such was not able to look at her upper chest which is where family reports rash. Musculoskeletal: grossly normal tone BUE/BLE Psychiatric: Patient has flat affect. Does not maintain eye contact. Speech is rapid and pressured like, alert and oriented to person but not place or time Neurologic: Unable to fully assess the patient's able to follow commands, moves all extremities equally           Labs on Admission:  Basic Metabolic Panel:  Recent Labs Lab 07/27/13 1506  NA 137  K 4.6  CL 97  CO2 32  GLUCOSE 208*  BUN 19  CREATININE 0.8  CALCIUM 10.1   Liver Function Tests:  Recent Labs Lab 07/27/13 1506  AST 15  ALT 23  ALKPHOS 58  BILITOT 0.7  PROT 7.2  ALBUMIN 4.0    Recent Labs Lab 07/27/13 1506  LIPASE 23.0   No results found for this basename: AMMONIA,  in the last 168 hours CBC:  Recent Labs Lab 07/27/13 1506  WBC 10.5  NEUTROABS 8.3*  HGB 14.3  HCT 42.2  MCV 91.8  PLT 269.0   Cardiac Enzymes: No results found for this basename: CKTOTAL, CKMB, CKMBINDEX, TROPONINI,  in the last 168 hours  BNP (last 3 results) No results found for this basename: PROBNP,  in the last 8760 hours CBG: No results found for this basename: GLUCAP,  in the last 168 hours  Radiological Exams on Admission: No results found.   Assessment/Plan Active Problems:   Metabolic encephalopathy (principal problem) - Etiology uncertain at this point. Could be secondary to recent medications as patient was started on Bactrim, prednisone, CellCept. The plan as outlined by her pulmonologist per his recommendations were to hold CellCept until January 16, discontinue Bactrim, and quick prednisone taper please refer to his office notes on 08/01/2013 for details regarding his recommendations. - Will obtain TSH, vitamin B12 levels, folate, RPR, ammonia, uremia levels - Also look for infectious etiologies we'll obtain urinalysis, urine culture, chest  x-ray  Unusual interstitial addendum Pneumonitis - We'll plan on continuing recommendations as outlined in Dr. [name] office note on 08/01/2013 - Will continue atovaquone for PCP prophylaxis - Discontinue Bactrim given suspected cause of rash on upper chest.  DM type II - Diabetic diet - Sliding scale insulin and will hold oral hypoglycemic agents while in house.  GERD - Could be cause of abdominal discomfort. While in house we'll place on Protonix 40 mg by mouth twice a day and if unable to take by mouth will transition to IV  Adverse drug effect - Bactrim will be discontinued, CellCept will be discontinued as well as that is reported side effect of nausea and vomiting and abdominal pain  Abdominal pain - Etiology uncertain. Patient did not allow me to do a proper physical exam nor do I think she would allow for a ultrasound of abdomen. We'll obtain a KUB - Could be secondary to CellCept and we'll hold CellCept as recommended by her pulmonologist - Could also be secondary to GERD we'll place on Protonix twice a day - Also will assess CMP and lipids for abnormalities of her liver transaminases and/or alkaline phosphatase. - Obtain urinalysis which is another common cause of abdominal discomfort.  Code Status: DO NOT RESUSCITATE Family Communication: Discussed with patient and family members at bedside Disposition Plan: Pending improvement in mentation back to baseline  Time spent: More than 60 minutes  Velvet Bathe [facility] Pager [phone]

## 2013-08-01 NOTE — Telephone Encounter (Signed)
Forms were given to [place] to send to [place].   Will sign off and route to [place] as FYI that this was taken care of.

## 2013-08-01 NOTE — Assessment & Plan Note (Signed)
It's not really clear to me what is happening here.  C.diff was negative, I suspect this is exacerbation of her gastritis from the prednisone.  With associated nausea/vomiting.  Plan: -wean prednisone quickly for a few days -PPI bid -admit for IVF, lab work up, +/- CT abdomen

## 2013-08-01 NOTE — Telephone Encounter (Signed)
I called Kaitlin Hamilton to see what was going on with Kaitlin Hamilton.  She was placed in a bed about 2:30 @ [facility] and placed on a solid food diet and taking her meds orally, which is concerning to the daughter.  After speaking to BQ, we need to see if Kaitlin Hamilton can handle the solid diet and oral meds.  Otherwise, they need to address their concerns to the hospitalist.  Pt's daughter is aware of these concerns.  Nothing further at this time.

## 2013-08-01 NOTE — Progress Notes (Signed)
Daughter and friend of the family at pts bedside concerned with mother's plan of care at the time. Questioned MD on why her mother was taking oral meds since she was under the impression that she would just be receiving IV fluids and meds IV/PR. Friend of the family stated that if we were going to do the same things that they were doing at home that they could have stayed there. Reassured the pt/fm that we were doing therapies that the hospitalist and her PCP were recommending and that we were waiting for test results to come back. Notified doctor of the patients families concerns. Talked back with the family and ensured them that MD would follow-up with them in the am after test results and labs came back and the plan can be readdressed at that time. Pt wants to eat and was in tears about eating and going home. Fm did order pt something from cafeteria and pt was happy about that and had no complaints so far. Will continue to assess and follow-up with pt and family. Fm ok with speaking to MD in the am when more test results/labs available.

## 2013-08-01 NOTE — Assessment & Plan Note (Signed)
She has not been able to take po for a few days and needs admission for IVF.  Not clear to me if this is related to the abdominal pain or not  Plan: -admission for IVF, anti-emetics -advance diet as tolerated

## 2013-08-01 NOTE — Progress Notes (Signed)
Subjective:    Patient ID: Kaitlin Hamilton, female    DOB: [birth year], 63 y.o.   MRN: [id number]  Synopsis: Kaitlin Hamilton is a very pleasant 63 year old female who first saw the [facility] pulmonary clinic in March 2014 for evaluation of shortness of breath. She had significant cough, wheezing, and sputum production requiring multiple rounds of antibiotics. Because of crackles on lung exam and an abnormal pulmonary function test she was referred to [place]. We ordered full pulmonary function testing which showed moderate restriction and a depressed DLCO in proportion to her restriction. There is no airflow obstruction and no change with bronchodilator administration. A chest x-ray performed in February 2014 was read as normal.  HPI   11/16/2012 ROV -- Kaitlin Hamilton feels that her cough and dyspnea is better since our last visit.  She is still taking Advair twice a day and rarely has to use her albuterol inhaler. She did use albuterol over the weekend when she was cleaning out a closet that was full of a lot of dust and mold. This made her short of breath and had chest congestion. The albuterol helped significantly. She stated that when she use the albuterol for the pulmonary function test yesterday she felt significant improvement in her breathing. Otherwise she is doing very well and has no symptoms.  04/19/2013 ROV -- Kaitlin Hamilton has had a rough time since her last visit. Unfortunately she never had a blood work done nor the barium swallow which we recommended last time. Approximately one month ago she developed a cough with some sputum production. She was treated with Augmentin as well as prednisone. She was also started on a Dulera inhaler at some point in the last month. She said that the prednisone deathly made her feel better but then her cough returned not long after she stopped that medicine. She has had some shortness of breath with this lately. The [facility] has not made any difference that she can tell. However she  does feel that the [facility] is making her blood sugar elevated.  She has been producing green sputum lately, particularly in the last three days.  05/17/2013 ROV > Kaitlin Hamilton feels like her breathing is better since the last visit.  She has been having ear pain for about 2-3 days in R ear. She has also noted some sinus symptoms since the last visit.   She continues to have a cough at night, but it is better overall and in the morning . When she bends over she has more cough.  She wonders if it is the carpet in her room. She typically works in the same room, but working in other rooms doesn't seem to make a difference in those.  Dyspnea has improved somewhat.    06/24/2013 ROV > Kaitlin Hamilton has had increased dyspnea, cough, and green mucus production ever since stopping the prednisone last week. No fevers or chills.  The dyspnea is mild.  She has not had chest pain or body aches, rash, or leg swelling.  No weight change.    She had an endoscopy which showed a Schatzki ring which was dilated by Dr. Hilarie Fredrickson.  She also had gastropathy which was H.Pylori positive.  07/27/2012 ROV >> Since surgery, she has only had two or three nights of good sleep.  The prednisone is really interfering with her sleep.  She is on a bad sleep cycle.  Last week she slept seven hours. Her family notes that she snores.    She is still having some dyspnea,  typically it is better on days when she gets more sleep. She has had some problem with dyspnea, not clear if worse since before surery or not.  She has had a hacking cough, but it has been mostly dry for the last few days.    She is taking protonix.  07/29/2013 Acute Wert> rash, bactrim held  08/01/2013 Acute >> Since our last visit Kaitlin Hamilton started Cellcept and Bactrim the following day and she started a rash with hives.  She saw Wert and he stopped the bactrim.  They noted that she took two cellcept the first day.   Her hives and rash were getting worse over the weekend but it has subtly  improved.. The biggest problem at this point is confusion.  She really hasn't slept much.  She has had a lot of abdominal pain over the last few nights with nausea and vomiting.  The pain is epigastric.  She has been taking her flagyl lately without improvement.  Her c.diff toxin assay was negative.  She has not had significant respiratory problems. The noted that they took too much cellcept on 1/8 (they took $RemoveBe'500mg'lgFfiUsUU$  bid instead of daily).  Past Medical History  Diagnosis Date  . Bronchitis   . Chicken pox   . Depression   . Allergic rhinitis     uses Flonase daily  . Hyperlipidemia     lost 43 pounds and no meds required at present  . Hypokalemia   . Anxiety   . PONV (postoperative nausea and vomiting)   . Hypertension     takes Verapamil and HCTZ daily  . Shortness of breath     with exertion;takes Singulair daily as well as Flonase  . Pneumonia     last time in 2006  . History of bronchitis     2013  . H/O hiatal hernia   . Migraines     last one about a month ago  . Dysphagia   . Joint swelling     left thumb  . OA (osteoarthritis)     left knee  . GERD (gastroesophageal reflux disease)     takes Protonix daily  . Hemorrhoids   . History of colon polyps   . Diverticulosis   . Urinary frequency   . History of kidney stones   . Non-insulin dependent type 2 diabetes mellitus     takes Glipizide daily  . Constipation     takes Fiber daily  . Hypothyroidism (acquired)     takes Synthroid daily  . Insomnia     doesn't take any meds for this     Family History  Problem Relation Age of Onset  . Rheum arthritis Mother   . Rheum arthritis Sister   . Prostate cancer Brother   . Heart disease Maternal Grandmother   . Heart disease Maternal Grandfather   . Uterine cancer Daughter   . Colon cancer Neg Hx      History   Social History  . Marital Status: Widowed    Spouse Name: N/A    Number of Children: 2  . Years of Education: N/A   Occupational History  . Day  Care at [employer]    Social History Main Topics  . Smoking status: Never Smoker   . Smokeless tobacco: Never Used  . Alcohol Use: No  . Drug Use: No  . Sexual Activity: Not on file   Other Topics Concern  . Not on file   Social History Narrative   Daily  caffeine      Allergies  Allergen Reactions  . Codeine Hives and Swelling  . Demerol [Meperidine] Hives and Swelling     Outpatient Prescriptions Prior to Visit  Medication Sig Dispense Refill  . acetaminophen (TYLENOL) 325 MG tablet Take 650 mg by mouth every 6 (six) hours as needed for mild pain.      . chlorpheniramine-HYDROcodone (TUSSIONEX PENNKINETIC ER) 10-8 MG/5ML LQCR Take 5 mLs by mouth every 12 (twelve) hours as needed for cough.  120 mL  0  . FIBER PO Take 1 tablet by mouth daily.      . fluticasone (FLONASE) 50 MCG/ACT nasal spray Place 2 sprays into the nose daily.       Marland Kitchen glipiZIDE (GLUCOTROL) 5 MG tablet Take 5 mg by mouth daily.      . hydrochlorothiazide (MICROZIDE) 12.5 MG capsule Take 12.5 mg by mouth daily.      Marland Kitchen levothyroxine (SYNTHROID, LEVOTHROID) 125 MCG tablet Take 125 mcg by mouth daily before breakfast.      . metroNIDAZOLE (FLAGYL) 250 MG tablet Take 1 tab 4 times daily.  56 tablet  0  . montelukast (SINGULAIR) 10 MG tablet Take 10 mg by mouth at bedtime.      . mycophenolate (CELLCEPT) 500 MG tablet Take 1 tablet (500 mg total) by mouth 2 (two) times daily.  60 tablet  2  . pantoprazole (PROTONIX) 40 MG tablet Take 1 tab in the am daily.  90 tablet  3  . predniSONE (DELTASONE) 20 MG tablet Take 2 tablets (40 mg total) by mouth daily with breakfast.  60 tablet  0  . saccharomyces boulardii (FLORASTOR) 250 MG capsule Take 1 capsule (250 mg total) by mouth 2 (two) times daily.  60 capsule  1  . traMADol (ULTRAM) 50 MG tablet Take by mouth every 6 (six) hours as needed.      . verapamil (VERELAN PM) 180 MG 24 hr capsule Take 180 mg by mouth daily.       No facility-administered medications prior to visit.      Review of Systems  Constitutional: Positive for activity change and fatigue. Negative for fever and chills.  HENT: Positive for ear pain, postnasal drip and rhinorrhea. Negative for congestion and nosebleeds.   Respiratory: Negative for cough, shortness of breath and wheezing.   Cardiovascular: Negative for chest pain, palpitations and leg swelling.  Gastrointestinal: Positive for nausea, vomiting and abdominal pain.  Psychiatric/Behavioral: Positive for confusion and agitation. The patient is nervous/anxious.        Objective:   Physical Exam   Filed Vitals:   08/01/13 0849  BP: 148/92  Pulse: 75  Temp: 97.5 F (36.4 C)  TempSrc: Oral  Weight: 240 lb (108.863 kg)  SpO2: 99%  Room Air  Gen: Markedly confused, paranoid today HEENT: NCAT,  EOMi, OP clear,  PULM: Crackles 1/3 way up bilaterally CV: RRR, no mgr, no JVD AB: BS+, soft, mild epigastric tenderness, no hsm Ext: warm, trace leg edema, no clubbing, no cyanosis Neuro: falling asleep in clinic, paranoid, doesn't recognize me, cannot focus on conversation, maew  February 2014 simple spirometry performed by her primary care physician>> ratio 90%, FEV1 1.71 L (64% predicted, FVC 1.83 L 55% predicted; flow volume loop is not consistent with obstruction February 2014 [facility] normal 10/19/2012 walked 500 feet in office on room air oxygenation did not drop below 90% 11/15/2012 Full PFT [name]> Ratio 87%, FEV1 2.00L > 2.07 with bronchodilator (3%  change); TLC 3.44 L (65% pred), ERV 0.62 (57% pred), DLCO 15.1 ml/mmHg/min (59% pred) 11/2012 CT chest >> ([facility] read) centrilobular nodules, interlobular septal thickening worse in bases and periphery R lung > L; some GGO in bases and periphery as well, some bronchiectasis in the bases R > L; findings suggestive of fibrosis but not UIP; question hypersensitivity pneumonitis given centrilobular nodules; also question aspiration 03/2013 ANA, ANCA, Anti-Jo-1, ESR, RF,  SCL-70, anti-centromere, SSA/SSB, all negative; CRP 0.6;  04/2013 Barium swallow> abnormal esophageal motility, GERD, hiatal hernia 04/2013 Full PFT> Ratio 93%, FEV1 1.92 L (71% pred), TLC 3.02L (56% pred), DLCO 15.94 (59% pred) 04/2013 CT chest ([name])> findings suggestive of but not diagnostic of NSIP, small pulmonary nodule 04/2013 6MW RA > 1100 feet, HR peak 109, O2 sat Nadir 87% 04/2013 HP panel >> 05/17/2013 3 month prednisone trial 06/2013 open lung biopsy> UIP 07/28/13 Cell cept and bactrim started > bactrim stopped 07/29/13 due to rash       Assessment & Plan:   Acute encephalopathy Kaitlin Hamilton is profoundly confused.  I think this is primarily due to the effect of her prednisone, but she needs admission for a lab work up.  Plan: -stop prednisone (quick taper off, $RemoveBe'20mg'CkSGQynLr$  today, $Remov'10mg'CJJDQi$  tomorrow, then hold until Jan 16) -admission for observation and work up per hospitalist service  Abdominal pain, epigastric It's not really clear to me what is happening here.  C.diff was negative, I suspect this is exacerbation of her gastritis from the prednisone.  With associated nausea/vomiting.  Plan: -wean prednisone quickly for a few days -PPI bid -admit for IVF, lab work up, +/- CT abdomen  Nausea and vomiting She has not been able to take po for a few days and needs admission for IVF.  Not clear to me if this is related to the abdominal pain or not  Plan: -admission for IVF, anti-emetics -advance diet as tolerated  Interstitial lung disease She has steroid responsive UIP and her disease tends to flare when she is off of steroids.  However, she has had a severe reaction to prednisone $RemoveBefor'40mg'NIwSSPygZaEd$  daily with acute encephalopathy.  She needs a break from steroids for a few days.  Plan: -quickly wean prednisone ($RemoveBeforeDE'20mg'QNuoAzVKQjmbrSe$  daily today, $RemoveBefo'10mg'GYMORGnXUFX$  daily tomorrow, then off until January 16) -stop cellcept for now -stop bactrim for PCP prophylaxis  On August 05, 2013: -start prednisone $RemoveBeforeDEI'10mg'EiSkoGaUWTJCHffw$  daily -start  cellcept $RemoveB'500mg'UiHTHhNO$  daily for one week, then on January 23, increase to twice a day -start Atovaquone $RemoveBeforeDEI'1500mg'xaWaiCFphMKkLOMD$  daily for PCP prophylaxis -cbc, CMET in one week  Continue O2 at night, need ambulatory O2 saturation check next visit  Adverse drug effect She has a rash on her chest which I think that is due to the bactrim, strange that we didn't see this when she was on the bactrim before.  Plan: -change bactrim to atovaquone for PCP prophylaxis    Updated Medication List Outpatient Encounter Prescriptions as of 08/01/2013  Medication Sig  . acetaminophen (TYLENOL) 325 MG tablet Take 650 mg by mouth every 6 (six) hours as needed for mild pain.  . chlorpheniramine-HYDROcodone (TUSSIONEX PENNKINETIC ER) 10-8 MG/5ML LQCR Take 5 mLs by mouth every 12 (twelve) hours as needed for cough.  . FIBER PO Take 1 tablet by mouth daily.  . fluticasone (FLONASE) 50 MCG/ACT nasal spray Place 2 sprays into the nose daily.   Marland Kitchen glipiZIDE (GLUCOTROL) 5 MG tablet Take 5 mg by mouth daily.  . hydrochlorothiazide (MICROZIDE) 12.5 MG capsule Take 12.5 mg by mouth  daily.  . levothyroxine (SYNTHROID, LEVOTHROID) 125 MCG tablet Take 125 mcg by mouth daily before breakfast.  . metroNIDAZOLE (FLAGYL) 250 MG tablet Take 1 tab 4 times daily.  . montelukast (SINGULAIR) 10 MG tablet Take 10 mg by mouth at bedtime.  . mycophenolate (CELLCEPT) 500 MG tablet Take 1 tablet (500 mg total) by mouth 2 (two) times daily.  . pantoprazole (PROTONIX) 40 MG tablet Take 1 tab in the am daily.  . predniSONE (DELTASONE) 20 MG tablet Take 2 tablets (40 mg total) by mouth daily with breakfast.  . saccharomyces boulardii (FLORASTOR) 250 MG capsule Take 1 capsule (250 mg total) by mouth 2 (two) times daily.  . traMADol (ULTRAM) 50 MG tablet Take by mouth every 6 (six) hours as needed.  . verapamil (VERELAN PM) 180 MG 24 hr capsule Take 180 mg by mouth daily.

## 2013-08-01 NOTE — Patient Instructions (Addendum)
We will call the hospitalist service at [facility] for direct admission for acute encephalopathy and intractable nausea and vomiting.  We want you to take prednisone 20mg  daily for a day, then 10mg  daily for a day, then hold it for 3 days  We will restart prednisone 10mg  daily on Friday August 05, 2013 On Friday January 16, start Cellcept 500mg  daily; take 500mg  po daily for a week, then on January 23 start taking 500mg  twice a day until you see me On Friday January 16, start Atovaquone 1500mg  daily with food for PCP prophylaxis  We will see you back in two weeks or sooner if needed

## 2013-08-02 ENCOUNTER — Inpatient Hospital Stay (HOSPITAL_COMMUNITY): Payer: BC Managed Care – PPO

## 2013-08-02 DIAGNOSIS — T50905S Adverse effect of unspecified drugs, medicaments and biological substances, sequela: Secondary | ICD-10-CM

## 2013-08-02 DIAGNOSIS — J8410 Pulmonary fibrosis, unspecified: Secondary | ICD-10-CM

## 2013-08-02 DIAGNOSIS — G4700 Insomnia, unspecified: Secondary | ICD-10-CM

## 2013-08-02 DIAGNOSIS — G9340 Encephalopathy, unspecified: Secondary | ICD-10-CM

## 2013-08-02 LAB — COMPREHENSIVE METABOLIC PANEL
ALBUMIN: 3.3 g/dL — AB (ref 3.5–5.2)
ALT: 18 U/L (ref 0–35)
AST: 12 U/L (ref 0–37)
Alkaline Phosphatase: 54 U/L (ref 39–117)
BUN: 10 mg/dL (ref 6–23)
CALCIUM: 9.2 mg/dL (ref 8.4–10.5)
CO2: 29 meq/L (ref 19–32)
Chloride: 100 mEq/L (ref 96–112)
Creatinine, Ser: 0.63 mg/dL (ref 0.50–1.10)
GFR calc Af Amer: >90 mL/min (ref >90)
Glucose, Bld: 128 mg/dL — ABNORMAL HIGH (ref 70–99)
Potassium: 3.9 mEq/L (ref 3.7–5.3)
SODIUM: 141 meq/L (ref 137–147)
Total Bilirubin: 0.5 mg/dL (ref 0.3–1.2)
Total Protein: 6.2 g/dL (ref 6.0–8.3)

## 2013-08-02 LAB — CBC
HCT: 40.7 % (ref 36.0–46.0)
HEMOGLOBIN: 13.4 g/dL (ref 12.0–15.0)
MCH: 31.2 pg (ref 26.0–34.0)
MCHC: 32.9 g/dL (ref 30.0–36.0)
MCV: 94.7 fL (ref 78.0–100.0)
Platelets: 229 10*3/uL (ref 150–400)
RBC: 4.3 MIL/uL (ref 3.87–5.11)
RDW: 13.9 % (ref 11.5–15.5)
WBC: 8.2 10*3/uL (ref 4.0–10.5)

## 2013-08-02 LAB — URINE CULTURE
Colony Count: NO GROWTH
Culture: NO GROWTH

## 2013-08-02 LAB — GLUCOSE, CAPILLARY
GLUCOSE-CAPILLARY: 198 mg/dL — AB (ref 70–99)
Glucose-Capillary: 133 mg/dL — ABNORMAL HIGH (ref 70–99)
Glucose-Capillary: 200 mg/dL — ABNORMAL HIGH (ref 70–99)
Glucose-Capillary: 162 mg/dL — ABNORMAL HIGH (ref 70–99)

## 2013-08-02 LAB — TSH: TSH: 0.853 u[IU]/mL (ref 0.350–4.500)

## 2013-08-02 LAB — VITAMIN B12: Vitamin B-12: 423 pg/mL (ref 211–911)

## 2013-08-02 LAB — FOLATE

## 2013-08-02 MED ORDER — TRAMADOL HCL 50 MG PO TABS
50.0000 mg | ORAL_TABLET | Freq: Once | ORAL | Status: AC
  Administered 2013-08-02: 50 mg via ORAL
  Filled 2013-08-02: qty 1

## 2013-08-02 MED ORDER — KETOROLAC TROMETHAMINE 15 MG/ML IJ SOLN
15.0000 mg | Freq: Once | INTRAMUSCULAR | Status: AC
  Administered 2013-08-02: 15 mg via INTRAVENOUS
  Filled 2013-08-02: qty 1

## 2013-08-02 MED ORDER — KETOROLAC TROMETHAMINE 30 MG/ML IJ SOLN
30.0000 mg | Freq: Once | INTRAMUSCULAR | Status: AC
  Administered 2013-08-02: 30 mg via INTRAVENOUS
  Filled 2013-08-02: qty 1

## 2013-08-02 MED ORDER — PREDNISONE 10 MG PO TABS
10.0000 mg | ORAL_TABLET | Freq: Every day | ORAL | Status: AC
  Administered 2013-08-03: 10 mg via ORAL
  Filled 2013-08-02: qty 1

## 2013-08-02 MED ORDER — ESZOPICLONE 2 MG PO TABS
2.0000 mg | ORAL_TABLET | Freq: Every day | ORAL | Status: DC
  Administered 2013-08-02: 2 mg via ORAL
  Filled 2013-08-02: qty 1

## 2013-08-02 MED ORDER — NON FORMULARY: 2.0000 mg | Freq: Every day | Status: DC

## 2013-08-02 MED ORDER — DIPHENHYDRAMINE HCL 50 MG/ML IJ SOLN
25.0000 mg | Freq: Once | INTRAMUSCULAR | Status: AC
  Administered 2013-08-02: 25 mg via INTRAVENOUS
  Filled 2013-08-02: qty 1

## 2013-08-02 MED ORDER — PANTOPRAZOLE SODIUM 40 MG PO TBEC
40.0000 mg | DELAYED_RELEASE_TABLET | Freq: Every day | ORAL | Status: DC
  Administered 2013-08-03: 40 mg via ORAL
  Filled 2013-08-02: qty 1

## 2013-08-02 MED ORDER — METOCLOPRAMIDE HCL 5 MG/ML IJ SOLN
10.0000 mg | Freq: Once | INTRAMUSCULAR | Status: AC
Start: 2013-08-02 — End: 2013-08-02
  Administered 2013-08-02: 10 mg via INTRAVENOUS
  Filled 2013-08-02: qty 2

## 2013-08-02 NOTE — Progress Notes (Signed)
Inpatient Diabetes Program Recommendations  AACE/ADA: New Consensus Statement on Inpatient Glycemic Control (2013)  Target Ranges:  Prepandial:   less than 140 mg/dL      Peak postprandial:   less than 180 mg/dL (1-2 hours)      Critically ill patients:  140 - 180 mg/dL   Reason for Visit: Hyperglycemia Results for Kaitlin Hamilton, Kaitlin Hamilton (MRN [id number]) as of 08/02/2013 13:45  Ref. Range 08/01/2013 16:39 08/01/2013 21:45 08/02/2013 08:01 08/02/2013 11:43  Glucose-Capillary Latest Range: 70-99 mg/dL 235 (H) 237 (H) 133 (H) 198 (H)     Inpatient Diabetes Program Recommendations Correction (SSI): Increase to resistant tidwc and hs Insulin - Meal Coverage: Add Novolog 3 units tidwc for meal coverage insulin while on steroids HgbA1C: Check HgbA1C to assess glycemic control prior to hospitalization  Note: Will follow while inpatient. Thank you. Lorenda Peck, RD, LDN, CDE Inpatient Diabetes Coordinator [phone]

## 2013-08-02 NOTE — Progress Notes (Signed)
Name: Kaitlin Hamilton MRN: [id number] DOB: 10/22/1950    ADMISSION DATE:  08/01/2013   BRIEF PATIENT DESCRIPTION: 63 y.o with steroid responsive UIP 9VATS 06/2013 ) admitted with confusion, blurred vision, hives & rash on prednisone & cellcept   SUBJECTIVE: Remains with some confusion Able to ambulate Blurred vision - improved Rash & hives better 'I am still unable to sleep'  VITAL SIGNS: Temp:  [97.4 F (36.3 C)-98.5 F (36.9 C)] 97.5 F (36.4 C) (01/13 0600) Pulse Rate:  [64-74] 64 (01/13 0600) Resp:  [18-20] 18 (01/13 0600) BP: (150-151)/(77-93) 151/93 mmHg (01/13 0600) SpO2:  [95 %-96 %] 96 % (01/13 0600) Weight:  [107.3 kg (236 lb 8.9 oz)] 107.3 kg (236 lb 8.9 oz) (01/12 1423)  PHYSICAL EXAMINATION: Gen. Pleasant, well-nourished,anxious affect ENT - no lesions, no post nasal drip Neck: No JVD, no thyromegaly, no carotid bruits Lungs: no use of accessory muscles, no dullness to percussion, clear without rales or rhonchi  Cardiovascular: Rhythm regular, heart sounds  normal, no murmurs, no peripheral edema Abdomen: soft and non-tender, no hepatosplenomegaly, BS normal. Musculoskeletal: No deformities, no cyanosis or clubbing Neuro:  Alert, confused,oriented x 2, non focal Skin:  Warm, no lesions/ rash    Recent Labs Lab 07/27/13 1506 08/01/13 1710 08/02/13 0540  NA 137 139 141  K 4.6 4.5 3.9  CL 97 97 100  CO2 32 30 29  BUN 19 15 10   CREATININE 0.8 0.71 0.63  GLUCOSE 208* 248* 128*    Recent Labs Lab 07/27/13 1506 08/01/13 1710 08/02/13 0540  HGB 14.3 14.6 13.4  HCT 42.2 44.8 40.7  WBC 10.5 9.2 8.2  PLT 269.0 275 229   Dg Chest 2 View  08/01/2013   CLINICAL DATA:  Dyspnea with fatigue and history of hypertension and obesity  EXAM: CHEST  2 VIEW  COMPARISON:  PA and lateral chest x-ray of July 12, 2013.  FINDINGS: The lungs are borderline hypoinflated. There is no focal infiltrate. The cardiopericardial silhouette is enlarged. The pulmonary  vascularity is not engorged. There is no pleural effusion or pneumothorax. The mediastinum is normal in width. There is mild degenerative disc change of the mid and lower thoracic spine. There is no pleural effusion.  IMPRESSION: There is no evidence of pneumonia nor pulmonary edema. Mild enlargement of the cardiac silhouette is present and stable.   Electronically Signed   By: David  Martinique   On: 08/01/2013 16:49   Abd 1 View (kub)  08/01/2013   CLINICAL DATA:  Abdominal pain and history of constipation and colonic polyps  EXAM: ABDOMEN - 1 VIEW  COMPARISON:  None  FINDINGS: The bowel gas pattern suggests constipation. There is no evidence of ileus nor obstruction nor perforation. There are no abnormal soft tissue calcifications. There are surgical clips in the gallbladder fossa. There is mild degenerative disc change of the lumbar spine. There is gentle curvature of the lumbar spine with the convexity towards the right. The observed portions of the bony pelvis exhibit no acute abnormalities.  IMPRESSION: The bowel gas pattern suggests constipation. There is no evidence of ileus nor obstruction.   Electronically Signed   By: David  Martin[place]   On: 08/01/2013 16:47    ASSESSMENT / PLAN:  Acute encephalopathy  -primarily due to the effect of her prednisone, no evidence of infection, electrolyte abn Plan:  -stop prednisone (quick taper off, today 10mg  , then hold until Jan 16)  -Vision changes may be due to hyperglycemia   Abdominal pain,  epigastric  -suspect this is exacerbation of her gastritis from the prednisone -better .  resolved nausea/vomiting. KUB neg Plan:  -wean prednisone quickly for a few days  -PPI bid    Interstitial lung disease  She has steroid responsive UIP and her disease tends to flare when she is off of steroids. However, she has had a severe reaction to prednisone 40mg  daily with acute encephalopathy. She needs a break from steroids for a few days.  Plan:  -quickly wean  prednisone (10mg  daily tomorrow, then off until January 16)  -stop cellcept for now  -stop bactrim for PCP prophylaxis  On August 05, 2013:  -start prednisone 10mg  daily  -start cellcept 500mg  daily for one week, then on January 23, increase to twice a day  -start Atovaquone 1500mg  daily for PCP prophylaxis  -cbc, CMET in one week    Adverse drug effect  She has a rash on her chest which may be due to the bactrim Plan:  -change bactrim to atovaquone for PCP prophylaxis  Insmonia - Ambien did not work at home, again [employer] steroids will help Lunesta meantime (I placed order)  Answered all questions by pt, daughter & family friend in room  Kara Mead MD. FCCP. [facility] Pager [phone] If no response call [phone]   08/02/2013, 9:16 AM

## 2013-08-02 NOTE — Progress Notes (Signed)
[facility] PROGRESS NOTE  Kaitlin Hamilton HYQ:[id number] DOB: [birth year] DOA: 08/01/2013 PCP: [name], MD  Assessment/Plan: Active Problems:  Metabolic encephalopathy (principal problem)  - Etiology uncertain at this point. Could be secondary to recent medications as patient was started on Bactrim, prednisone, CellCept. The plan as outlined by her pulmonologist per his recommendations were to hold CellCept until January 16, discontinue Bactrim, and quick prednisone taper please refer to his office notes on 08/01/2013 for details regarding his recommendations.  -  TSH, vitamin B12 levels, folate, RPR, ammonia, uremia levels reviewed and within normal limits - Workup negative for source of infection  Unusual interstitial pneumonitis - We'll plan on continuing recommendations as outlined in Dr. [name] office note on 08/01/2013  - Will continue atovaquone for PCP prophylaxis  - Discontinue Bactrim given suspected cause of rash on upper chest.   DM type II  - Diabetic diet  - Sliding scale insulin and will hold oral hypoglycemic agents while in house.   GERD  - Abdominal discomfort discussed below will place back on home regimen Protonix.  Adverse drug effect  - Bactrim will be discontinued, CellCept will be discontinued as well as that is reported side effect of nausea and vomiting and abdominal pain   Abdominal pain  -Abdominal discomfort most likely secondary to constipation given abdominal x-ray reports. - Could be secondary to CellCept and we'll hold CellCept as recommended by her pulmonologist  - Given x-ray finding will plan on placing on once a day Protonix by: Regimen - CMP showing normal AST and ALT as well as alkaline phosphatase. - Urinalysis negative for source of infection which could explain her abdominal discomfort   Code Status: Full Family Communication: Discussed with patient and friend at bedside Disposition Plan: Pending further recommendations from  pulmonologist   Consultants:  Pulmonology  Procedures:  As listed above  Antibiotics:  Atovaquone  HPI/Subjective: No new complaints, patient feels better today.  Objective: Filed Vitals:   08/02/13 1455  BP: 130/80  Pulse: 67  Temp: 97.7 F (36.5 C)  Resp: 20    Intake/Output Summary (Last 24 hours) at 08/02/13 1858 Last data filed at 08/02/13 1833  Gross per 24 hour  Intake 1698.35 ml  Output   2000 ml  Net -301.65 ml   Filed Weights   08/01/13 1423  Weight: 107.3 kg (236 lb 8.9 oz)    Exam:   General:  Pt in NAD, Alert and awake  Cardiovascular: RRR, no MRG  Respiratory: CTA BL, no wheezes  Abdomen: ND, obese, NT  Musculoskeletal: no cyanosis or clubbing   Data Reviewed: Basic Metabolic Panel:  Recent Labs Lab 07/27/13 1506 08/01/13 1710 08/02/13 0540  NA 137 139 141  K 4.6 4.5 3.9  CL 97 97 100  CO2 32 30 29  GLUCOSE 208* 248* 128*  BUN 19 15 10   CREATININE 0.8 0.71 0.63  CALCIUM 10.1 10.0 9.2  MG  --  2.1  --   PHOS  --  3.6  --    Liver Function Tests:  Recent Labs Lab 07/27/13 1506 08/01/13 1710 08/02/13 0540  AST 15 15 12   ALT 23 22 18   ALKPHOS 58 67 54  BILITOT 0.7 0.5 0.5  PROT 7.2 7.1 6.2  ALBUMIN 4.0 3.9 3.3*    Recent Labs Lab 07/27/13 1506  LIPASE 23.0    Recent Labs Lab 08/01/13 1710  AMMONIA 25   CBC:  Recent Labs Lab 07/27/13 1506 08/01/13 1710 08/02/13 0540  WBC  10.5 9.2 8.2  NEUTROABS 8.3* 7.5  --   HGB 14.3 14.6 13.4  HCT 42.2 44.8 40.7  MCV 91.8 95.7 94.7  PLT 269.0 275 229   Cardiac Enzymes: No results found for this basename: CKTOTAL, CKMB, CKMBINDEX, TROPONINI,  in the last 168 hours BNP (last 3 results) No results found for this basename: PROBNP,  in the last 8760 hours CBG:  Recent Labs Lab 08/01/13 1639 08/01/13 2145 08/02/13 0801 08/02/13 1143 08/02/13 1737  GLUCAP 235* 237* 133* 198* 200*    Recent Results (from the past 240 hour(s))  CLOSTRIDIUM DIFFICILE BY  PCR     Status: None   Collection Time    07/29/13  2:29 PM      Result Value Range Status   C difficile by pcr Not Detected  Not Detected Final   Comment:       This assay detects the presence of Clostridium difficile DNA coding     for toxin B (tcdB) by real-time polymerase chain reaction (PCR)     amplification.     This test was developed and its performance characteristics have been     determined by [employer]. Performance characteristics refer     to the analytical performance of the test. This test has not been     cleared or approved by the [place] Food and Drug Administration. The FDA     has determined that such clearance or approval is not necessary. This     laboratory is certified under the [employer] as qualified to perform high complexity clinical     laboratory testing.     Studies: Dg Chest 2 View  08/01/2013   CLINICAL DATA:  Dyspnea with fatigue and history of hypertension and obesity  EXAM: CHEST  2 VIEW  COMPARISON:  PA and lateral chest x-ray of July 12, 2013.  FINDINGS: The lungs are borderline hypoinflated. There is no focal infiltrate. The cardiopericardial silhouette is enlarged. The pulmonary vascularity is not engorged. There is no pleural effusion or pneumothorax. The mediastinum is normal in width. There is mild degenerative disc change of the mid and lower thoracic spine. There is no pleural effusion.  IMPRESSION: There is no evidence of pneumonia nor pulmonary edema. Mild enlargement of the cardiac silhouette is present and stable.   Electronically Signed   By: David  Martinique   On: 08/01/2013 16:49   Abd 1 View (kub)  08/01/2013   CLINICAL DATA:  Abdominal pain and history of constipation and colonic polyps  EXAM: ABDOMEN - 1 VIEW  COMPARISON:  None  FINDINGS: The bowel gas pattern suggests constipation. There is no evidence of ileus nor obstruction nor perforation. There are no abnormal soft tissue  calcifications. There are surgical clips in the gallbladder fossa. There is mild degenerative disc change of the lumbar spine. There is gentle curvature of the lumbar spine with the convexity towards the right. The observed portions of the bony pelvis exhibit no acute abnormalities.  IMPRESSION: The bowel gas pattern suggests constipation. There is no evidence of ileus nor obstruction.   Electronically Signed   By: David  Martinique   On: 08/01/2013 16:47   Ct Head Wo Contrast  08/02/2013   CLINICAL DATA:  Severe headache and dizziness. Altered mental status.  EXAM: CT HEAD WITHOUT CONTRAST  TECHNIQUE: Contiguous axial images were obtained from the base of the skull through the vertex without contrast.  COMPARISON:  None  FINDINGS: Normal appearance of the intracranial structures. No evidence for acute hemorrhage, mass lesion, midline shift, hydrocephalus or large infarct. No acute bony abnormality. The visualized sinuses are clear.  IMPRESSION: No acute intracranial abnormality.   Electronically Signed   By: Rolla Flatten M.D.   On: 08/02/2013 18:30    Scheduled Meds: . [START ON 08/05/2013] atovaquone  1,500 mg Oral Q breakfast  . eszopiclone  2 mg Oral QHS  . fluticasone  2 spray Each Nare Daily  . heparin  5,000 Units Subcutaneous Q8H  . hydrochlorothiazide  12.5 mg Oral Daily  . insulin aspart  0-15 Units Subcutaneous TID WC  . insulin aspart  0-5 Units Subcutaneous QHS  . levothyroxine  125 mcg Oral QAC breakfast  . montelukast  10 mg Oral QHS  . [START ON 08/12/2013] mycophenolate  500 mg Oral BID  . [START ON 08/05/2013] mycophenolate  500 mg Oral Daily  . pantoprazole  40 mg Oral BID  . [START ON 08/03/2013] predniSONE  10 mg Oral Q breakfast  . verapamil  180 mg Oral Daily   Continuous Infusions: . sodium chloride 100 mL/hr at 08/02/13 1246    Principal Problem:   Metabolic encephalopathy Active Problems:   UIP (usual interstitial pneumonitis)   GERD (gastroesophageal reflux disease)    Abdominal pain, epigastric   Adverse drug effect   Nausea and vomiting   Type II or unspecified type diabetes mellitus without mention of complication, not stated as uncontrolled    Time spent: > 35 minutes    Velvet Bathe  [facility] Pager [phone] If 7PM-7AM, please contact night-coverage at [email or web address], password [facility] 08/02/2013, 6:58 PM  LOS: 1 day

## 2013-08-02 NOTE — Progress Notes (Signed)
UR completed. Patient changed to inpatient r/t requiring IVF @ 100cc/hr and con't monitoring for AMS

## 2013-08-02 NOTE — Progress Notes (Signed)
Patient's family has requested of nursing and tech staff to please let patient sleep and not disturb except for essential care, if needed family will let us know.  Requesting to see the doctor, Dr. Doyle Askew notified via text page

## 2013-08-02 NOTE — Progress Notes (Signed)
Patient's family member states she continues to complain of headache unrelieved by the pain medication that she has received.  Text paged Dr. Wendee Beavers

## 2013-08-03 DIAGNOSIS — G9341 Metabolic encephalopathy: Secondary | ICD-10-CM

## 2013-08-03 DIAGNOSIS — R1013 Epigastric pain: Secondary | ICD-10-CM

## 2013-08-03 DIAGNOSIS — T887XXA Unspecified adverse effect of drug or medicament, initial encounter: Principal | ICD-10-CM

## 2013-08-03 LAB — GLUCOSE, CAPILLARY
GLUCOSE-CAPILLARY: 127 mg/dL — AB (ref 70–99)
Glucose-Capillary: 196 mg/dL — ABNORMAL HIGH (ref 70–99)

## 2013-08-03 MED ORDER — MYCOPHENOLATE MOFETIL 250 MG PO CAPS: 500.0000 mg | ORAL_CAPSULE | Freq: Every day | ORAL | Status: DC

## 2013-08-03 MED ORDER — PREDNISONE 10 MG PO TABS: ORAL_TABLET | ORAL | Status: DC

## 2013-08-03 MED ORDER — PANTOPRAZOLE SODIUM 40 MG PO TBEC: 40.0000 mg | DELAYED_RELEASE_TABLET | Freq: Two times a day (BID) | ORAL | Status: DC

## 2013-08-03 MED ORDER — CHESTAL HONEY COUGH PO SYRP: 5.0000 mL | ORAL_SOLUTION | ORAL | Status: DC | PRN

## 2013-08-03 MED ORDER — MYCOPHENOLATE MOFETIL 500 MG PO TABS: 500.0000 mg | ORAL_TABLET | Freq: Two times a day (BID) | ORAL | Status: DC

## 2013-08-03 MED ORDER — TRAMADOL-ACETAMINOPHEN 37.5-325 MG PO TABS: 1.0000 | ORAL_TABLET | Freq: Four times a day (QID) | ORAL | Status: DC | PRN

## 2013-08-03 MED ORDER — ATOVAQUONE 750 MG/5ML PO SUSP: 1500.0000 mg | Freq: Every day | ORAL | Status: DC

## 2013-08-03 MED ORDER — ESZOPICLONE 2 MG PO TABS: 2.0000 mg | ORAL_TABLET | Freq: Every day | ORAL | Status: DC

## 2013-08-03 NOTE — Progress Notes (Signed)
Name: Kaitlin Hamilton MRN: [id number] DOB: Apr 18, 1951    ADMISSION DATE:  08/01/2013   BRIEF PATIENT DESCRIPTION: 63 y.o with steroid responsive UIP (VATS 06/2013 ) admitted with confusion, blurred vision, hives & rash on prednisone & cellcept   SUBJECTIVE:  Confusion markedly improved Able to ambulate Blurred vision - improved Rash & hives better Slept well with lunesta  VITAL SIGNS: Temp:  [97.7 F (36.5 C)] 97.7 F (36.5 C) (01/14 0656) Pulse Rate:  [60-67] 60 (01/14 0656) Resp:  [18-20] 18 (01/14 0656) BP: (128-143)/(73-86) 143/86 mmHg (01/14 0656) SpO2:  [97 %-100 %] 100 % (01/14 0656)  PHYSICAL EXAMINATION: Gen. Pleasant, well-nourished,anxious affect ENT - no lesions, no post nasal drip Neck: No JVD, no thyromegaly, no carotid bruits Lungs: no use of accessory muscles, no dullness to percussion, clear without rales or rhonchi  Cardiovascular: Rhythm regular, heart sounds  normal, no murmurs, no peripheral edema Abdomen: soft and non-tender, no hepatosplenomegaly, BS normal. Musculoskeletal: No deformities, no cyanosis or clubbing Neuro:  Alert, confused,oriented x 2, non focal Skin:  Warm, no lesions/ rash    Recent Labs Lab 07/27/13 1506 08/01/13 1710 08/02/13 0540  NA 137 139 141  K 4.6 4.5 3.9  CL 97 97 100  CO2 32 30 29  BUN 19 15 10   CREATININE 0.8 0.71 0.63  GLUCOSE 208* 248* 128*    Recent Labs Lab 07/27/13 1506 08/01/13 1710 08/02/13 0540  HGB 14.3 14.6 13.4  HCT 42.2 44.8 40.7  WBC 10.5 9.2 8.2  PLT 269.0 275 229   Dg Chest 2 View  08/01/2013   CLINICAL DATA:  Dyspnea with fatigue and history of hypertension and obesity  EXAM: CHEST  2 VIEW  COMPARISON:  PA and lateral chest x-ray of July 12, 2013.  FINDINGS: The lungs are borderline hypoinflated. There is no focal infiltrate. The cardiopericardial silhouette is enlarged. The pulmonary vascularity is not engorged. There is no pleural effusion or pneumothorax. The mediastinum is normal in  width. There is mild degenerative disc change of the mid and lower thoracic spine. There is no pleural effusion.  IMPRESSION: There is no evidence of pneumonia nor pulmonary edema. Mild enlargement of the cardiac silhouette is present and stable.   Electronically Signed   By: David  Martinique   On: 08/01/2013 16:49   Abd 1 View (kub)  08/01/2013   CLINICAL DATA:  Abdominal pain and history of constipation and colonic polyps  EXAM: ABDOMEN - 1 VIEW  COMPARISON:  None  FINDINGS: The bowel gas pattern suggests constipation. There is no evidence of ileus nor obstruction nor perforation. There are no abnormal soft tissue calcifications. There are surgical clips in the gallbladder fossa. There is mild degenerative disc change of the lumbar spine. There is gentle curvature of the lumbar spine with the convexity towards the right. The observed portions of the bony pelvis exhibit no acute abnormalities.  IMPRESSION: The bowel gas pattern suggests constipation. There is no evidence of ileus nor obstruction.   Electronically Signed   By: David  Martinique   On: 08/01/2013 16:47   Ct Head Wo Contrast  08/02/2013   CLINICAL DATA:  Severe headache and dizziness. Altered mental status.  EXAM: CT HEAD WITHOUT CONTRAST  TECHNIQUE: Contiguous axial images were obtained from the base of the skull through the vertex without contrast.  COMPARISON:  None  FINDINGS: Normal appearance of the intracranial structures. No evidence for acute hemorrhage, mass lesion, midline shift, hydrocephalus or large infarct. No acute bony  abnormality. The visualized sinuses are clear.  IMPRESSION: No acute intracranial abnormality.   Electronically Signed   By: Rolla Flatten M.D.   On: 08/02/2013 18:30    ASSESSMENT / PLAN:  Acute encephalopathy  -primarily due to the effect of her prednisone, no evidence of infection, electrolyte abn Plan:  -stop prednisone  -Vision changes may be due to hyperglycemia -resolved   Abdominal pain, epigastric    -suspect this is exacerbation of her gastritis from the prednisone -better .  resolved nausea/vomiting. KUB neg Plan:  -PPI bid    Interstitial lung disease  She has steroid responsive UIP and her disease tends to flare when she is off of steroids. However, she has had a severe reaction to prednisone 40mg  daily with acute encephalopathy. She needs a break from steroids for a few days.  Plan:  -hold prednisone ( until January 16)  -stop cellcept for now  -stop bactrim for PCP prophylaxis  On August 05, 2013:  -start prednisone 10mg  daily  -start cellcept 500mg  daily for one week, then on January 23, increase to twice a day  -start Atovaquone 1500mg  daily for PCP prophylaxis  -cbc, CMET in one week  Above recomm are per Dr [name] - I would suggest if symptoms persist can keep pred on hold until she is able to see him again   Adverse drug effect  - rash on her chest which may be due to the bactrim Plan:  -change bactrim to atovaquone for PCP prophylaxis  Insmonia - Ambien did not work at home, again [employer] steroids will help Lunesta 2mg  qhs prn x 5 tabs on discharge  Answered all questions by pt, daughter  Kara Mead MD. FCCP. [facility] Pager [phone] If no response call [phone]   08/03/2013, 10:10 AM

## 2013-08-03 NOTE — Evaluation (Signed)
Physical Therapy One Time Evaluation Patient Details Name: Kaitlin Hamilton MRN: [id number] DOB: February 21, 1951 Today's Date: 08/03/2013 Time: 2505-3976 PT Time Calculation (min): 13 min  PT Assessment / Plan / Recommendation History of Present Illness  63 y.o with steroid responsive UIP (VATS 06/2013 ) admitted with confusion, blurred vision, hives & rash on prednisone & cellcept  Clinical Impression  Patient evaluated by Physical Therapy with no further acute PT needs identified. All education has been completed and the patient has no further questions.  Pt reports family and friends will be available for a week upon d/c to assist her at home.  Pt reports decline in home functioning since lung biopsy in Dec and presents with limited endurance and generalized weakness so recommend [place].  Pt plans to d/c home today.  PT is signing off. Thank you for this referral.     PT Assessment  All further PT needs can be met in the next venue of care    Follow Up Recommendations  Home health PT    Does the patient have the potential to tolerate intense rehabilitation      Barriers to Discharge        Equipment Recommendations  None recommended by PT    Recommendations for Other Services     Frequency      Precautions / Restrictions Precautions Precaution Comments: monitor sats Restrictions Weight Bearing Restrictions: No   Pertinent Vitals/Pain SpO2 87-90% room air at rest, pt denies SOB SpO2 88-90% during ambulation room air Pt reports she has oxygen available to use at home.      Mobility  Bed Mobility General bed mobility comments: pt up in recliner on arrival Transfers Overall transfer level: Modified independent Equipment used: None Ambulation/Gait Ambulation/Gait assistance: Supervision Ambulation Distance (Feet): 240 Feet Assistive device: None Gait Pattern/deviations: Step-through pattern Gait velocity: decr General Gait Details: increased lateral lean  likely due to obesity, increased short standing rest breaks throughout gait due to fatigue, SpO2 88-90% room air, occasionally reaching for hand rail as well, discussed using RW at home to improve endurance    Exercises     PT Diagnosis: Difficulty walking;Generalized weakness  PT Problem List: Decreased strength;Decreased activity tolerance;Decreased mobility PT Treatment Interventions:       PT Goals(Current goals can be found in the care plan section) Acute Rehab PT Goals PT Goal Formulation: No goals set, d/c therapy  Visit Information  Last PT Received On: 08/03/13 Assistance Needed: +1 History of Present Illness: 63 y.o with steroid responsive UIP (VATS 06/2013 ) admitted with confusion, blurred vision, hives & rash on prednisone & cellcept       Prior [place] expects to be discharged to:: Private residence Living Arrangements: Alone Available Help at Discharge: Family;Friend(s);Available 24 hours/day Type of Home: House Home Access: Stairs to enter [employer] of Steps: 5 Home Layout: One level Home Equipment: Walker - 2 wheels Prior Function Level of Independence: Independent Comments: pt reports she has home O2 if needed, also states decreased mobility and decline in home functioning since lung biopsy in Dec. Communication Communication: No difficulties    Cognition  Cognition Arousal/Alertness: Awake/alert Behavior During Therapy: WFL for tasks assessed/performed Overall Cognitive Status: Within Functional Limits for tasks assessed    Extremity/Trunk Assessment Lower Extremity Assessment Lower Extremity Assessment: Generalized weakness   Balance    End of Session PT - End of Session Activity Tolerance: Patient limited by fatigue Patient left: in chair Nurse Communication:  Mobility status (need for HHPT)  GP     Katrina Daddona,KATHrine E 08/03/2013, 1:00 PM Carmelia Bake, PT, DPT 08/03/2013 Pager: [phone]

## 2013-08-03 NOTE — Discharge Summary (Signed)
Physician Discharge Summary  Kaitlin Hamilton [id number] DOB: [birth year] DOA: 08/01/2013  PCP: [name], MD  Admit date: 08/01/2013 Discharge date: 08/03/2013  Time spent: 40 minutes  Recommendations for Outpatient Follow-up:  Home with HHPT Check cbc and CMET in 1 week Follow up with Dr [name] in 1 weeks   Discharge Diagnoses:  Principal Problem:   Metabolic encephalopathy  Active Problems:   UIP (usual interstitial pneumonitis)   GERD (gastroesophageal reflux disease)   Insomnia   Abdominal pain, epigastric   Adverse drug effect   Nausea and vomiting   Type II or unspecified type diabetes mellitus without mention of complication, not stated as uncontrolled   Discharge Condition: fair  Diet recommendation: diabetic  Filed Weights   08/01/13 1423  Weight: 107.3 kg (236 lb 8.9 oz)    History of present illness:  Please refer to admission H&P for details, but in brief, 63 y.o. female with history of interstitial pneumonitis currently followed by Dr. [name]. Reportedly had VATS last month for evaluation and biopsy. At that point was diagnosed with UIP. Since then patient has been on prednisone and was recently started on CellCept and Bactrim. History is obtained from family at bedside as patient is confused and unable to provide much of the history. Family is currently denying any recent increase in shortness of breath or fevers. Of note patient recently had endoscopy by Dr. Hilarie Fredrickson which showed a Schatzki ring and patient was H. pylori positive. Family states the patient has also been complaining of abdominal discomfort with intermittent nausea. As a result has had poor oral intake.  Given recent altered mental status specialist recommended hospital admission for further evaluation and recommendations.      Hospital Course:    principal problem Metabolic encephalopathy  -  Could be secondary to recent medications as patient was started on Bactrim, prednisone, CellCept.  -pulmonary consult  to hold CellCept until January 16, discontinue Bactrim, and quick prednisone taper  -cellcept held until 1/16 and will be started at 500 mg daily for 1 week until 1/23 then 500 mg bid until she sees Dr [name]. Quick prednisone taper , received 10 mg today then stop until 1/16. She will then resume prednisone at 10 mg daily until her f/up with Dr [name].  -BID PPI - TSH, vitamin B12 levels, folate, RPR, ammonia, uremia levels reviewed and within normal limits  - Workup negative for source of infection  -mental status at normal  Unusual interstitial pneumonitis  - We'll plan on continuing recommendations as outlined in Dr. [name] office note on 08/01/2013  - Will continue atovaquone for PCP prophylaxis  - Discontinue Bactrim given suspected cause of rash on upper chest.   DM type II  - Diabetic diet  - resume oral hypoglycemics. Follow up with PCP  GERD  - Switched protonix to bid for now.   Adverse drug effect  - Bactrim will be discontinued, CellCept will be discontinued as well as that is reported side effect of nausea and vomiting and abdominal pain  Now resolved  Abdominal pain  -Abdominal discomfort most likely secondary to constipation given abdominal x-ray reports.  - Could be secondary to CellCept . Now resolved. Placed on bid PPI - CMP showing normal AST and ALT as well as alkaline phosphatase.  - Urinalysis negative for source of infection which could explain her abdominal discomfort   Insomnia  improved with lunesta. We'll prescribe her 5 days of nightime lunesta for sleep. I have warned her and her  daughter that Johnnye Sima does have significant ascites take off headaches (almost >20 percent and she should stop taking it if her headache does not improve or worsen.  Headache  no clear etiology. Possibly has mild maxillary sinusitis given right sinus tenderness and runny nose. Head CT negative. Patient refuses to start on new antibiotic as she has been on  multiple antibiotics for possible month. Instructed on taking steam inhalation at home. We'll prescribe  Ultracet.   Patient stable for d/c home . Follow up with pulmonary as outpt in 2 weeks. Check cbc and CMET in 1 week while on cell cept  Code Status: Full  Family Communication: Daughter at bedside Disposition Plan: Home with home health  Consultants:  Pulmonology  Antibiotics:  Atovaquone     Discharge Exam: Filed Vitals:   08/03/13 0656  BP: 143/86  Pulse: 60  Temp: 97.7 F (36.5 C)  Resp: 18    General: Elderly female in no acute distress HEENT: No pallor, moist oral mucosa Chest: Clear to auscultation bilaterally, no added sounds CVS: Normal S1 and S2, no murmurs rub or gallop Abdomen: Soft, nontender, nondistended, bowel sounds present Extremities: Warm, no edema CNS: AAO x3   Discharge Instructions   Future Appointments Provider Department Dept Phone   08/15/2013 4:30 PM Juanito Doom, MD [facility]  [phone]   08/21/2013 8:00 PM [facility] [place] [phone]   08/25/2013 8:30 AM [place], MD [facility] [place]   09/13/2013 12:00 PM Juanito Doom, MD [facility] [phone]       Medication List    STOP taking these medications       chlorpheniramine-HYDROcodone 10-8 MG/5ML Lqcr  Commonly known as:  [facility]     diphenhydrAMINE 25 mg capsule  Commonly known as:  BENADRYL     traMADol 50 MG tablet  Commonly known as:  ULTRAM      TAKE these medications       acetaminophen 500 MG tablet  Commonly known as:  TYLENOL  Take 1,000 mg by mouth every 6 (six) hours as needed for moderate pain.     atovaquone 750 MG/5ML suspension  Commonly known as:  MEPRON  Take 10 mLs (1,500 mg total) by mouth daily with breakfast. First dose on 1/16     CHESTAL HONEY COUGH Syrp  Take 5 mLs by mouth every other day as needed.      eszopiclone 2 MG Tabs tablet  Commonly known as:  LUNESTA  Take 1 tablet (2 mg total) by mouth at bedtime. Take immediately before bedtime     FIBER PO  Take 1 tablet by mouth daily.     fluticasone 50 MCG/ACT nasal spray  Commonly known as:  FLONASE  Place 2 sprays into the nose daily.     glipiZIDE 5 MG tablet  Commonly known as:  GLUCOTROL  Take 5 mg by mouth daily.     hydrochlorothiazide 12.5 MG capsule  Commonly known as:  MICROZIDE  Take 12.5 mg by mouth daily.     hydrocortisone cream 0.5 %  Apply 1 application topically 2 (two) times daily as needed (for rash).     levothyroxine 125 MCG tablet  Commonly known as:  SYNTHROID, LEVOTHROID  Take 125 mcg by mouth daily before breakfast.     montelukast 10 MG tablet  Commonly known as:  SINGULAIR  Take 10 mg by mouth at bedtime.  mycophenolate 500 MG tablet  Commonly known as:  CELLCEPT  Take 1 tablet (500 mg total) by mouth 2 (two) times daily.     pantoprazole 40 MG tablet  Commonly known as:  PROTONIX  Take 1 tablet (40 mg total) by mouth 2 (two) times daily.     predniSONE 10 MG tablet  Commonly known as:  DELTASONE  Start taking from 08/05/2013     PROBIOTIC DAILY PO  Take 2 capsules by mouth 2 (two) times daily.     traMADol-acetaminophen 37.5-325 MG per tablet  Commonly known as:  ULTRACET  Take 1 tablet by mouth every 6 (six) hours as needed.     verapamil 180 MG 24 hr capsule  Commonly known as:  VERELAN PM  Take 180 mg by mouth daily.       Allergies  Allergen Reactions  . Codeine Hives and Swelling  . Demerol [Meperidine] Hives and Swelling  . Hydrocodone Nausea Only  . Sulfa Antibiotics Rash       Follow-up Information   Follow up with [name], MD In 1 week.   Specialty:  Family Medicine   Contact information:   [address] [place] [address] [phone]       Follow up with Simonne Maffucci, MD. Call in 2 weeks.   Specialty:   Pulmonary Disease   Contact information:   [place] [id number] [place] [id number] [phone]        The results of significant diagnostics from this hospitalization (including imaging, microbiology, ancillary and laboratory) are listed below for reference.    Significant Diagnostic Studies: Dg Chest 2 View  08/01/2013   CLINICAL DATA:  Dyspnea with fatigue and history of hypertension and obesity  EXAM: CHEST  2 VIEW  COMPARISON:  PA and lateral chest x-ray of July 12, 2013.  FINDINGS: The lungs are borderline hypoinflated. There is no focal infiltrate. The cardiopericardial silhouette is enlarged. The pulmonary vascularity is not engorged. There is no pleural effusion or pneumothorax. The mediastinum is normal in width. There is mild degenerative disc change of the mid and lower thoracic spine. There is no pleural effusion.  IMPRESSION: There is no evidence of pneumonia nor pulmonary edema. Mild enlargement of the cardiac silhouette is present and stable.   Electronically Signed   By: David  Martinique   On: 08/01/2013 16:49   Dg Chest 2 View  07/12/2013   CLINICAL DATA:  63 year old female with pain, weakness and dizziness. Initial encounter. Postinflammatory pulmonary fibrosis. Status post lung biopsy earlier this month.  EXAM: CHEST  2 VIEW  COMPARISON:  07/13/2013 and earlier.  FINDINGS: Stable to mildly improved lung volumes. Stable cardiac size and mediastinal contours. Visualized tracheal air column is within normal limits. No pneumothorax, pulmonary edema, pleural effusion or consolidation. Stable to mild regression of peripheral and basilar interstitial markings, with residual opacity better depicted on the lateral view today. Stable visualized osseous structures.  IMPRESSION: No new cardiopulmonary abnormality with stable to mildly improved ventilation compared to recent exams.   Electronically Signed   By: Lars Pinks M.D.   On: 07/12/2013 12:42   Abd 1 View  (kub)  08/01/2013   CLINICAL DATA:  Abdominal pain and history of constipation and colonic polyps  EXAM: ABDOMEN - 1 VIEW  COMPARISON:  None  FINDINGS: The bowel gas pattern suggests constipation. There is no evidence of ileus nor obstruction nor perforation. There are no abnormal soft tissue  calcifications. There are surgical clips in the gallbladder fossa. There is mild degenerative disc change of the lumbar spine. There is gentle curvature of the lumbar spine with the convexity towards the right. The observed portions of the bony pelvis exhibit no acute abnormalities.  IMPRESSION: The bowel gas pattern suggests constipation. There is no evidence of ileus nor obstruction.   Electronically Signed   By: David  Martinique   On: 08/01/2013 16:47   Ct Head Wo Contrast  08/02/2013   CLINICAL DATA:  Severe headache and dizziness. Altered mental status.  EXAM: CT HEAD WITHOUT CONTRAST  TECHNIQUE: Contiguous axial images were obtained from the base of the skull through the vertex without contrast.  COMPARISON:  None  FINDINGS: Normal appearance of the intracranial structures. No evidence for acute hemorrhage, mass lesion, midline shift, hydrocephalus or large infarct. No acute bony abnormality. The visualized sinuses are clear.  IMPRESSION: No acute intracranial abnormality.   Electronically Signed   By: Rolla Flatten M.D.   On: 08/02/2013 18:30    Microbiology: Recent Results (from the past 240 hour(s))  CLOSTRIDIUM DIFFICILE BY PCR     Status: None   Collection Time    07/29/13  2:29 PM      Result Value Range Status   C difficile by pcr Not Detected  Not Detected Final   Comment:       This assay detects the presence of Clostridium difficile DNA coding     for toxin B (tcdB) by real-time polymerase chain reaction (PCR)     amplification.     This test was developed and its performance characteristics have been     determined by [facility]. Performance characteristics refer     to the analytical  performance of the test. This test has not been     cleared or approved by the [place] Food and Drug Administration. The FDA     has determined that such clearance or approval is not necessary. This     laboratory is certified under the [place] as qualified to perform high complexity clinical     laboratory testing.  URINE CULTURE     Status: None   Collection Time    08/01/13  5:04 PM      Result Value Range Status   Specimen Description URINE, CATHETERIZED   Final   Special Requests Immunocompromised   Final   Culture  Setup Time     Final   Value: 08/01/2013 21:10     Performed at [facility]     Final   Value: NO GROWTH     Performed at [facility]   Culture     Final   Value: NO GROWTH     Performed at [facility]   Report Status 08/02/2013 FINAL   Final     Labs: Basic Metabolic Panel:  Recent Labs Lab 07/27/13 1506 08/01/13 1710 08/02/13 0540  NA 137 139 141  K 4.6 4.5 3.9  CL 97 97 100  CO2 32 30 29  GLUCOSE 208* 248* 128*  BUN 19 15 10   CREATININE 0.8 0.71 0.63  CALCIUM 10.1 10.0 9.2  MG  --  2.1  --   PHOS  --  3.6  --    Liver Function Tests:  Recent Labs Lab 07/27/13 1506 08/01/13 1710 08/02/13 0540  AST 15 15 12   ALT 23 22 18  ALKPHOS 58 67 54  BILITOT 0.7 0.5 0.5  PROT 7.2 7.1 6.2  ALBUMIN 4.0 3.9 3.3*    Recent Labs Lab 07/27/13 1506  LIPASE 23.0    Recent Labs Lab 08/01/13 1710  AMMONIA 25   CBC:  Recent Labs Lab 07/27/13 1506 08/01/13 1710 08/02/13 0540  WBC 10.5 9.2 8.2  NEUTROABS 8.3* 7.5  --   HGB 14.3 14.6 13.4  HCT 42.2 44.8 40.7  MCV 91.8 95.7 94.7  PLT 269.0 275 229   Cardiac Enzymes: No results found for this basename: CKTOTAL, CKMB, CKMBINDEX, TROPONINI,  in the last 168 hours BNP: BNP (last 3 results) No results found for this basename: PROBNP,  in the last 8760 hours CBG:  Recent Labs Lab 08/02/13 1143  08/02/13 1737 08/02/13 2125 08/03/13 0747 08/03/13 1122  GLUCAP 198* 200* 162* 127* 196*       Signed:  Kenneshia Rehm, [place]  [facility] 08/03/2013, 11:41 AM

## 2013-08-03 NOTE — Care Management Note (Unsigned)
    Page 1 of 1   08/03/2013     11:46:52 AM   CARE MANAGEMENT NOTE 08/03/2013  Patient:  Kaitlin Hamilton, Kaitlin Hamilton   Account Number:  [id number]  Date Initiated:  08/03/2013  Documentation initiated by:  Allene Dillon  Subjective/Objective Assessment:   63 year old female admitted with confusion.     Action/Plan:   From home. HHPT services beig receommended.   Anticipated DC Date:  08/03/2013   Anticipated DC Plan:  [place]  CM consult      Choice offered to / List presented to:  C-1 Patient        [place] arranged  [facility]      [place].   Status of service:  In process, will continue to follow Medicare Important Message given?  NA - LOS <3 / Initial given by admissions (If response is "NO", the following Medicare IM given date fields will be blank) Date Medicare IM given:   Date Additional Medicare IM given:    Discharge Disposition:    Per UR Regulation:  Reviewed for med. necessity/level of care/duration of stay  If discussed at [place] of Stay Meetings, dates discussed:    Comments:  08/03/13 Allene Dillon RN BSN Pt and daughter wanted resources for assistance at home in the future. I provided them with the list of private duty providers.

## 2013-08-05 ENCOUNTER — Telehealth: Payer: Self-pay | Admitting: Pulmonary Disease

## 2013-08-05 MED ORDER — ESZOPICLONE 3 MG PO TABS: 3.0000 mg | ORAL_TABLET | Freq: Every day | ORAL | Status: DC

## 2013-08-05 NOTE — Telephone Encounter (Signed)
3mg  max dose Advise warm milk at bedtime No lights in room No caffeine after breakfast Get out and walk during the daytime in sunlight

## 2013-08-05 NOTE — Telephone Encounter (Signed)
Fine to increase lunesta to 2mg  Fine to hold off on restarting meds until Monday

## 2013-08-05 NOTE — Telephone Encounter (Signed)
I called and spoke with Dorian Pod. Made her aware of BQ recs. I have also called in RX to the pharmacy. Nothing further needed

## 2013-08-05 NOTE — Telephone Encounter (Signed)
Spoke with Dorian Pod. Pt was d/c'd on Wed from the hospital (in epic).  1) Pt was giving lunesta 2mg  from the hospital to help with her sleep. Pt is still waking up every 1-2 hrs and gets up about 4:30 AM and can't go back to sleep. She goes to sleep abput 10:30 PM.Wants to know if this can be increased since the 2 mg is not helping? 2) Also Dorian Pod wants to know if pt can start her abx on Monday Just incase she has an allergic reaction to this medication. She doesn't feel it would be safe to start this on a weekend if pt does have an allergic reaction to this.  3) Pt took pred 10 mg this morning. Dorian Pod is wanting to give pt a break from this and restart this on Monday with everything else for her to take. She wants to know if this would be okay as well   Please advise Dr. [name] thanks

## 2013-08-05 NOTE — Telephone Encounter (Signed)
Pt already takes Lunesta 2 mg. They are wanting to increase this dose. Please advise what strength it can be increased too and quantity we can give pt? thanks

## 2013-08-08 ENCOUNTER — Encounter (HOSPITAL_COMMUNITY): Payer: Self-pay | Admitting: Emergency Medicine

## 2013-08-08 ENCOUNTER — Telehealth: Payer: Self-pay | Admitting: Internal Medicine

## 2013-08-08 ENCOUNTER — Emergency Department (HOSPITAL_COMMUNITY): Payer: BC Managed Care – PPO

## 2013-08-08 ENCOUNTER — Observation Stay (HOSPITAL_COMMUNITY)
Admission: EM | Admit: 2013-08-08 | Discharge: 2013-08-13 | Disposition: A | Discharge status: Home / Self Care | Payer: BC Managed Care – PPO | Attending: Internal Medicine | Admitting: Internal Medicine

## 2013-08-08 ENCOUNTER — Telehealth: Payer: Self-pay | Admitting: Pulmonary Disease

## 2013-08-08 DIAGNOSIS — R42 Dizziness and giddiness: Secondary | ICD-10-CM | POA: Insufficient documentation

## 2013-08-08 DIAGNOSIS — R1013 Epigastric pain: Secondary | ICD-10-CM | POA: Diagnosis present

## 2013-08-08 DIAGNOSIS — G4700 Insomnia, unspecified: Secondary | ICD-10-CM

## 2013-08-08 DIAGNOSIS — R5383 Other fatigue: Secondary | ICD-10-CM

## 2013-08-08 DIAGNOSIS — J84112 Idiopathic pulmonary fibrosis: Secondary | ICD-10-CM | POA: Diagnosis present

## 2013-08-08 DIAGNOSIS — K5900 Constipation, unspecified: Secondary | ICD-10-CM | POA: Insufficient documentation

## 2013-08-08 DIAGNOSIS — K5730 Diverticulosis of large intestine without perforation or abscess without bleeding: Secondary | ICD-10-CM | POA: Insufficient documentation

## 2013-08-08 DIAGNOSIS — K2990 Gastroduodenitis, unspecified, without bleeding: Principal | ICD-10-CM

## 2013-08-08 DIAGNOSIS — E785 Hyperlipidemia, unspecified: Secondary | ICD-10-CM | POA: Insufficient documentation

## 2013-08-08 DIAGNOSIS — Z79899 Other long term (current) drug therapy: Secondary | ICD-10-CM | POA: Insufficient documentation

## 2013-08-08 DIAGNOSIS — R109 Unspecified abdominal pain: Secondary | ICD-10-CM | POA: Diagnosis present

## 2013-08-08 DIAGNOSIS — R4182 Altered mental status, unspecified: Secondary | ICD-10-CM | POA: Insufficient documentation

## 2013-08-08 DIAGNOSIS — J8410 Pulmonary fibrosis, unspecified: Secondary | ICD-10-CM | POA: Insufficient documentation

## 2013-08-08 DIAGNOSIS — K224 Dyskinesia of esophagus: Secondary | ICD-10-CM | POA: Diagnosis present

## 2013-08-08 DIAGNOSIS — G9340 Encephalopathy, unspecified: Secondary | ICD-10-CM

## 2013-08-08 DIAGNOSIS — R0609 Other forms of dyspnea: Secondary | ICD-10-CM | POA: Insufficient documentation

## 2013-08-08 DIAGNOSIS — T50905A Adverse effect of unspecified drugs, medicaments and biological substances, initial encounter: Secondary | ICD-10-CM

## 2013-08-08 DIAGNOSIS — R112 Nausea with vomiting, unspecified: Secondary | ICD-10-CM

## 2013-08-08 DIAGNOSIS — K219 Gastro-esophageal reflux disease without esophagitis: Secondary | ICD-10-CM

## 2013-08-08 DIAGNOSIS — Z9089 Acquired absence of other organs: Secondary | ICD-10-CM | POA: Insufficient documentation

## 2013-08-08 DIAGNOSIS — E119 Type 2 diabetes mellitus without complications: Secondary | ICD-10-CM

## 2013-08-08 DIAGNOSIS — K222 Esophageal obstruction: Secondary | ICD-10-CM | POA: Insufficient documentation

## 2013-08-08 DIAGNOSIS — E039 Hypothyroidism, unspecified: Secondary | ICD-10-CM | POA: Insufficient documentation

## 2013-08-08 DIAGNOSIS — K2970 Gastritis, unspecified, without bleeding: Principal | ICD-10-CM | POA: Insufficient documentation

## 2013-08-08 DIAGNOSIS — R5381 Other malaise: Secondary | ICD-10-CM | POA: Insufficient documentation

## 2013-08-08 DIAGNOSIS — Q619 Cystic kidney disease, unspecified: Secondary | ICD-10-CM | POA: Insufficient documentation

## 2013-08-08 DIAGNOSIS — E669 Obesity, unspecified: Secondary | ICD-10-CM | POA: Insufficient documentation

## 2013-08-08 DIAGNOSIS — R197 Diarrhea, unspecified: Secondary | ICD-10-CM

## 2013-08-08 DIAGNOSIS — R0989 Other specified symptoms and signs involving the circulatory and respiratory systems: Secondary | ICD-10-CM | POA: Insufficient documentation

## 2013-08-08 LAB — URINALYSIS, ROUTINE W REFLEX MICROSCOPIC
Bilirubin Urine: NEGATIVE
Glucose, UA: 500 mg/dL — AB
HGB URINE DIPSTICK: NEGATIVE
Ketones, ur: NEGATIVE mg/dL
NITRITE: NEGATIVE
PH: 6.5 (ref 5.0–8.0)
Protein, ur: 30 mg/dL — AB
SPECIFIC GRAVITY, URINE: 1.02 (ref 1.005–1.030)
Urobilinogen, UA: 0.2 mg/dL (ref 0.0–1.0)

## 2013-08-08 LAB — URINE MICROSCOPIC-ADD ON

## 2013-08-08 LAB — CBC WITH DIFFERENTIAL/PLATELET
BASOS ABS: 0 10*3/uL (ref 0.0–0.1)
BASOS PCT: 0 % (ref 0–1)
EOS ABS: 0.1 10*3/uL (ref 0.0–0.7)
EOS PCT: 1 % (ref 0–5)
HCT: 44.6 % (ref 36.0–46.0)
Hemoglobin: 15 g/dL (ref 12.0–15.0)
Lymphocytes Relative: 14 % (ref 12–46)
Lymphs Abs: 1.4 10*3/uL (ref 0.7–4.0)
MCH: 31.9 pg (ref 26.0–34.0)
MCHC: 33.6 g/dL (ref 30.0–36.0)
MCV: 94.9 fL (ref 78.0–100.0)
Monocytes Absolute: 0.4 10*3/uL (ref 0.1–1.0)
Monocytes Relative: 4 % (ref 3–12)
Neutro Abs: 8 10*3/uL — ABNORMAL HIGH (ref 1.7–7.7)
Neutrophils Relative %: 82 % — ABNORMAL HIGH (ref 43–77)
PLATELETS: 249 10*3/uL (ref 150–400)
RBC: 4.7 MIL/uL (ref 3.87–5.11)
RDW: 13.6 % (ref 11.5–15.5)
WBC: 9.8 10*3/uL (ref 4.0–10.5)

## 2013-08-08 LAB — COMPREHENSIVE METABOLIC PANEL
ALT: 22 U/L (ref 0–35)
AST: 15 U/L (ref 0–37)
Albumin: 3.8 g/dL (ref 3.5–5.2)
Alkaline Phosphatase: 69 U/L (ref 39–117)
BUN: 9 mg/dL (ref 6–23)
CALCIUM: 9.4 mg/dL (ref 8.4–10.5)
CO2: 23 mEq/L (ref 19–32)
Chloride: 98 mEq/L (ref 96–112)
Creatinine, Ser: 0.57 mg/dL (ref 0.50–1.10)
GFR calc non Af Amer: >90 mL/min (ref >90)
Glucose, Bld: 192 mg/dL — ABNORMAL HIGH (ref 70–99)
Potassium: 4.5 mEq/L (ref 3.7–5.3)
Sodium: 135 mEq/L — ABNORMAL LOW (ref 137–147)
TOTAL PROTEIN: 7.3 g/dL (ref 6.0–8.3)
Total Bilirubin: 0.5 mg/dL (ref 0.3–1.2)

## 2013-08-08 LAB — GLUCOSE, CAPILLARY: GLUCOSE-CAPILLARY: 182 mg/dL — AB (ref 70–99)

## 2013-08-08 MED ORDER — IBUPROFEN 200 MG PO TABS
400.0000 mg | ORAL_TABLET | Freq: Once | ORAL | Status: AC
  Administered 2013-08-08: 400 mg via ORAL
  Filled 2013-08-08: qty 2

## 2013-08-08 MED ORDER — IOHEXOL 300 MG/ML  SOLN: 50.0000 mL | Freq: Once | INTRAMUSCULAR | Status: AC | PRN

## 2013-08-08 MED ORDER — ONDANSETRON 8 MG PO TBDP
8.0000 mg | ORAL_TABLET | Freq: Once | ORAL | Status: AC
  Administered 2013-08-08: 8 mg via ORAL
  Filled 2013-08-08: qty 1

## 2013-08-08 MED ORDER — IOHEXOL 300 MG/ML  SOLN
100.0000 mL | Freq: Once | INTRAMUSCULAR | Status: AC | PRN
  Administered 2013-08-08: 100 mL via INTRAVENOUS

## 2013-08-08 MED ORDER — IOHEXOL 300 MG/ML  SOLN
50.0000 mL | Freq: Once | INTRAMUSCULAR | Status: AC | PRN
  Administered 2013-08-08: 50 mL via ORAL

## 2013-08-08 NOTE — ED Notes (Signed)
Pt also c/o pain that comes and goes in chest.

## 2013-08-08 NOTE — Telephone Encounter (Signed)
Informed pt's friend of Dr Vena Rua suggestions to go to the ER and hopefully a Hospitalist will admit her with referrals to GI and possibly Pulmonary.

## 2013-08-08 NOTE — ED Provider Notes (Signed)
CSN: [id number]     Arrival date & time 08/08/13  Kaitlin Hamilton History   First MD Initiated Contact with Patient 08/08/13 1514     Chief Complaint  Patient presents with  . Abdominal Pain  . Chest Pain   (Consider location/radiation/quality/duration/timing/severity/associated sxs/prior Treatment) Patient is a 63 y.o. female presenting with abdominal pain and chest pain. The history is provided by the patient.  Abdominal Pain Pain location:  Periumbilical (central) Pain quality: aching   Pain radiates to:  Does not radiate Pain severity:  Moderate Onset quality:  Gradual Duration:  4 weeks Timing:  Constant Progression:  Worsening (worsened over past 2-3 days) Chronicity:  New Context: recent illness   Context: not alcohol use, not sick contacts and not trauma   Relieved by:  Nothing Worsened by:  Nothing tried Associated symptoms: chest pain (happened once yesterday and once 2 days ago, 10 minutse at a time, nonradiating, described as tightness)   Associated symptoms: no cough, no fever and no shortness of breath   Chest Pain Associated symptoms: abdominal pain   Associated symptoms: no cough, no fever and no shortness of breath     Past Medical History  Diagnosis Date  . Bronchitis   . Chicken pox   . Depression   . Allergic rhinitis     uses Flonase daily  . Hyperlipidemia     lost 43 pounds and no meds required at present  . Hypokalemia   . Anxiety   . PONV (postoperative nausea and vomiting)   . Hypertension     takes Verapamil and HCTZ daily  . Shortness of breath     with exertion;takes Singulair daily as well as Flonase  . Pneumonia     last time in 2006  . History of bronchitis     2013  . H/O hiatal hernia   . Migraines     last one about a month ago  . Dysphagia   . Joint swelling     left thumb  . OA (osteoarthritis)     left knee  . GERD (gastroesophageal reflux disease)     takes Protonix daily  . Hemorrhoids   . History of colon polyps   .  Diverticulosis   . Urinary frequency   . History of kidney stones   . Non-insulin dependent type 2 diabetes mellitus     takes Glipizide daily  . Constipation     takes Fiber daily  . Hypothyroidism (acquired)     takes Synthroid daily  . Insomnia     doesn't take any meds for this   Past Surgical History  Procedure Laterality Date  . Tubal ligation    . Cholecystectomy    . Appendectomy    . Abdominal exploration surgery      For Ovarian Cyst   . Left knee arthroscopy    . Esophagogastroduodenoscopy    . Tcs    . Lithotripsy      x 2  . Video assisted thoracoscopy Right 06/29/2013    Procedure: VIDEO ASSISTED THORACOSCOPY;  Surgeon: Melrose Nakayama, MD;  Location: [facility];  Service: Thoracic;  Laterality: Right;  . Lung biopsy Right 06/29/2013    Procedure: LUNG BIOPSY;  Surgeon: Melrose Nakayama, MD;  Location: [facility];  Service: Thoracic;  Laterality: Right;   Family History  Problem Relation Age of Onset  . Rheum arthritis Mother   . Rheum arthritis Sister   . Prostate cancer Brother   . Heart disease  Maternal Grandmother   . Heart disease Maternal Grandfather   . Uterine cancer Daughter   . Colon cancer Neg Hx    History  Substance Use Topics  . Smoking status: Never Smoker   . Smokeless tobacco: Never Used  . Alcohol Use: No   OB History   Grav Para Term Preterm Abortions TAB SAB Ect Mult Living                 Review of Systems  Constitutional: Negative for fever.       Decreased amount of sleep  Respiratory: Negative for cough and shortness of breath.   Cardiovascular: Positive for chest pain (happened once yesterday and once 2 days ago, 10 minutse at a time, nonradiating, described as tightness).  Gastrointestinal: Positive for abdominal pain.  All other systems reviewed and are negative.    Allergies  Codeine; Demerol; Hydrocodone; and Sulfa antibiotics  Home Medications   Current Outpatient Rx  Name  Route  Sig  Dispense  Refill  .  acetaminophen (TYLENOL) 500 MG tablet   Oral   Take 1,000 mg by mouth every 6 (six) hours as needed for moderate pain.         . Eszopiclone (ESZOPICLONE) 3 MG TABS   Oral   Take 1 tablet (3 mg total) by mouth at bedtime. Take immediately before bedtime   5 tablet   0   . FIBER PO   Oral   Take 1 tablet by mouth daily.         . fluticasone (FLONASE) 50 MCG/ACT nasal spray   Nasal   Place 2 sprays into the nose daily.          Marland Kitchen glipiZIDE (GLUCOTROL) 5 MG tablet   Oral   Take 5 mg by mouth daily.         . Homeopathic Products (CHESTAL HONEY COUGH) SYRP   Oral   Take 5 mLs by mouth every other day as needed.   120 mL   1   . hydrochlorothiazide (MICROZIDE) 12.5 MG capsule   Oral   Take 12.5 mg by mouth daily.         . hydrocortisone cream 0.5 %   Topical   Apply 1 application topically 2 (two) times daily as needed (for rash).         Marland Kitchen levothyroxine (SYNTHROID, LEVOTHROID) 125 MCG tablet   Oral   Take 125 mcg by mouth daily before breakfast.         . montelukast (SINGULAIR) 10 MG tablet   Oral   Take 10 mg by mouth at bedtime.         . pantoprazole (PROTONIX) 40 MG tablet   Oral   Take 1 tablet (40 mg total) by mouth 2 (two) times daily.   90 tablet   3   . predniSONE (DELTASONE) 10 MG tablet   Oral   Take 10 mg by mouth daily with breakfast. Start taking from 08/05/2013         . Probiotic Product (PROBIOTIC DAILY PO)   Oral   Take 2 capsules by mouth 2 (two) times daily.         . traMADol-acetaminophen (ULTRACET) 37.5-325 MG per tablet   Oral   Take 1 tablet by mouth every 6 (six) hours as needed.   30 tablet   0   . verapamil (VERELAN PM) 180 MG 24 hr capsule   Oral   Take 180 mg by mouth  daily.         . atovaquone (MEPRON) 750 MG/5ML suspension   Oral   Take 10 mLs (1,500 mg total) by mouth daily with breakfast. First dose on 1/16   210 mL   2   . mycophenolate (CELLCEPT) 500 MG tablet   Oral   Take 1 tablet  (500 mg total) by mouth 2 (two) times daily.   60 tablet   2     Take 500mg  daily for for 1 week starting on 1/16,  ...    BP 146/96  Pulse 88  Temp(Src) 98 F (36.7 C) (Oral)  Resp 16  SpO2 97% Physical Exam  Nursing note and vitals reviewed. Constitutional: She is oriented to person, place, and time. She appears well-developed and well-nourished. No distress.  HENT:  Head: Normocephalic and atraumatic.  Eyes: EOM are normal. Pupils are equal, round, and reactive to light.  Neck: Normal range of motion. Neck supple.  Cardiovascular: Normal rate and regular rhythm.  Exam reveals no friction rub.   No murmur heard. Pulmonary/Chest: Effort normal. No respiratory distress. She has decreased breath sounds (diffuse). She has no wheezes. She has no rhonchi. She has no rales.  Abdominal: Soft. She exhibits no distension. There is tenderness (central). There is no rebound.  Musculoskeletal: Normal range of motion. She exhibits no edema.  Neurological: She is alert and oriented to person, place, and time. No cranial nerve deficit. She exhibits normal muscle tone.  Skin: She is not diaphoretic.    ED Course  Procedures (including critical care time) Labs Review Labs Reviewed  GLUCOSE, CAPILLARY - Abnormal; Notable for the following:    Glucose-Capillary 182 (*)    All other components within normal limits  CBC WITH DIFFERENTIAL - Abnormal; Notable for the following:    Neutrophils Relative % 82 (*)    Neutro Abs 8.0 (*)    All other components within normal limits  COMPREHENSIVE METABOLIC PANEL - Abnormal; Notable for the following:    Sodium 135 (*)    Glucose, Bld 192 (*)    All other components within normal limits  URINALYSIS, ROUTINE W REFLEX MICROSCOPIC   Imaging Review Dg Chest 2 View  08/08/2013   CLINICAL DATA:  Left chest pain.  Diabetes and hypertension.  EXAM: CHEST  2 VIEW  COMPARISON:  08/01/2013  FINDINGS: Heart size remains stable. Pulmonary interstitial  prominence is stable. No evidence of acute infiltrate or edema. No evidence of pleural effusion. Mediastinal contours are stable.  IMPRESSION: Stable exam.  No active cardiopulmonary disease.   Electronically Signed   By: Earle Gell M.D.   On: 08/08/2013 16:14   Ct Abdomen Pelvis W Contrast  08/08/2013   CLINICAL DATA:  Intermittent upper abdominal pain.  EXAM: CT ABDOMEN AND PELVIS WITH CONTRAST  TECHNIQUE: Multidetector CT imaging of the abdomen and pelvis was performed using the standard protocol following bolus administration of intravenous contrast.  CONTRAST:  163mL OMNIPAQUE IOHEXOL 300 MG/ML  SOLN  COMPARISON:  DG ABDOMEN 1V dated 08/01/2013  FINDINGS: Linear densities in the lung bases, likely atelectasis. Heart is borderline in size. No pleural effusions.  Prior cholecystectomy. Liver, spleen, pancreas, adrenals are unremarkable.  Bilateral punctate nonobstructing renal stones. No ureteral stones or hydronephrosis. Urinary bladder is unremarkable. 5 cm left midpole cyst.  Transverse, descending colonic and sigmoid diverticulosis. No changes of active diverticulitis. Small bowel is decompressed. No free fluid, free air or adenopathy. Uterus and adnexa are unremarkable. Aorta is normal caliber.  No acute bony abnormality.  IMPRESSION: Colonic diverticulosis.  No active diverticulitis.  No acute findings in the abdomen or pelvis.  Prior cholecystectomy.  Bibasilar atelectasis.   Electronically Signed   By: Rolm Baptise M.D.   On: 08/08/2013 18:24    EKG Interpretation    Date/Time:  Monday August 08 2013 13:34:40 EST Ventricular Rate:  86 PR Interval:  147 QRS Duration: 77 QT Interval:  371 QTC Calculation: 444 R Axis:   -26 Text Interpretation:  Sinus rhythm Inferior infarct, old Similar to prior Confirmed by [facility]  MD, [place] ([id number]) on 08/08/2013 3:24:36 PM            MDM   1. Abdominal pain, epigastric   2. GERD (gastroesophageal reflux disease)   3. Type II or unspecified type  diabetes mellitus without mention of complication, not stated as uncontrolled   4. UIP (usual interstitial pneumonitis)    42M presents with chest pain and abdominal pain.  Chest pain - 2 episodes, once yesterday, once two days ago. Central, squeezing, nonradiating, lasting 10 mintues each time. No SOB, no cough. No syncope. No prior hx of this before. Here EKG normal. Will check troponins. Does have hx of pneumonitis - attempted to take Cellcept, but unable to due to painful rash. She has been on multiple antibiotics for her interstitial pneumonia. Abdominal Pain - present for past month - had H. Pylori after seen by GI - had endoscopy and colonsocopy. Patient [facility] GI consult [facility] and has seen GI recently. She's been on multiple meds for her GI symptoms. She had negative C. Diff. Dr. Hilarie Fredrickson put recommendations in for probable admission for GI consult to help straighten out her medications and help relieve her abdominal pain. She's not had a CT scan. She has midline tenderness here. Will CT.  She also is complaining of a headache. This was worked up on prior admission, had CT Head negative. No fever, no meningeal signs here, doubt meningitis. Last admission they thought headache was from [place]. CT normal. Labs normal. I spoke with hospitalist who is mildly concerned about subacute cholecystitis and will obtain HIDA.  Dr. Candiss Norse admitting. GI consulted and will see patient.   Osvaldo Shipper, MD 08/08/13 [phone]

## 2013-08-08 NOTE — Telephone Encounter (Signed)
Currently in ED

## 2013-08-08 NOTE — Telephone Encounter (Signed)
Pt's friend called with pt on the speaker to give an update on the pt. Pt with interstitial lung disease that was started on Prednisone by Dr [name]. She has been on numerous AB for her lungs and we placed her on Pylera for H.Pylori the 1st week on December. The prednisone caused ( ? ) pt to be sleep deprived and delirium and Dr [name] admitted her last week from 08/01/13 to 08/03/13 where the prednisone was tapered off. Friend reports pt hadn't slept more than 2 hours/night before the admission. Pt placed on Lunesta and now sleeps up to 4 hours at a time. Pt is now experiencing nausea and now vomiting for 2 days. Everything she eats runs straight through her. She is having 3-4 stools/day that are black, but CDIFF was -after 07/29/13 OV with Nicoletta Ba, PA. She had been started on Flagyl by Amy, but stopped when results were received. Her pain is midway between rib cage and and is migrating below her waist to her lower abdomen; she also describes the pain as burning also. She has also taken 3 doses of Pepto Bismol in the past 2 days. Pt and her friend would like her admitted to straighten out her stomach issues; they would like for Dr Hilarie Fredrickson to speak with Dr [name] about this. Please advise. Thanks.

## 2013-08-08 NOTE — Telephone Encounter (Signed)
It seems that patient has continued to struggle despite recent hospitalization. She has had several medications started and stopped over the last several weeks which clouds the picture Certainly seems that sleep deprivation is still a problem Recent stool studies were negative but GI symptoms continue Given multiple medical issues addressed recently, I recommend patient be seen in the ER for probable admission to the hospitalist service with GI consultation

## 2013-08-08 NOTE — ED Notes (Signed)
Patient transported to CT 

## 2013-08-08 NOTE — Progress Notes (Signed)
Cbc, cmp have already been ordered and resulted.

## 2013-08-08 NOTE — H&P (Signed)
Patient Demographics  Kaitlin Hamilton, is a 63 y.o. female  MRN: [id number]   DOB - [birth year]  Admit Date - 08/08/2013  Outpatient Primary MD for the patient is [name], MD   With History of -  Past Medical History  Diagnosis Date  . Bronchitis   . Chicken pox   . Depression   . Allergic rhinitis     uses Flonase daily  . Hyperlipidemia     lost 43 pounds and no meds required at present  . Hypokalemia   . Anxiety   . PONV (postoperative nausea and vomiting)   . Hypertension     takes Verapamil and HCTZ daily  . Shortness of breath     with exertion;takes Singulair daily as well as Flonase  . Pneumonia     last time in 2006  . History of bronchitis     2013  . H/O hiatal hernia   . Migraines     last one about a month ago  . Dysphagia   . Joint swelling     left thumb  . OA (osteoarthritis)     left knee  . GERD (gastroesophageal reflux disease)     takes Protonix daily  . Hemorrhoids   . History of colon polyps   . Diverticulosis   . Urinary frequency   . History of kidney stones   . Non-insulin dependent type 2 diabetes mellitus     takes Glipizide daily  . Constipation     takes Fiber daily  . Hypothyroidism (acquired)     takes Synthroid daily  . Insomnia     doesn't take any meds for this      Past Surgical History  Procedure Laterality Date  . Tubal ligation    . Cholecystectomy    . Appendectomy    . Abdominal exploration surgery      For Ovarian Cyst   . Left knee arthroscopy    . Esophagogastroduodenoscopy    . Tcs    . Lithotripsy      x 2  . Video assisted thoracoscopy Right 06/29/2013    Procedure: VIDEO ASSISTED THORACOSCOPY;  Surgeon: Melrose Nakayama, MD;  Location: [place];  Service: Thoracic;  Laterality: Right;  . Lung biopsy Right 06/29/2013    Procedure:  LUNG BIOPSY;  Surgeon: Melrose Nakayama, MD;  Location: [place];  Service: Thoracic;  Laterality: Right;    in for   Chief Complaint  Patient presents with  . Abdominal Pain  . Chest Pain     HPI  Kaitlin Hamilton  is a 63 y.o. female, straight of interstitial pneumonitis with recent lung biopsy under the care of Dr. [name], diabetes mellitus type 2, GERD, recently diagnosed with H. pylori infection, chronic insomnia and headaches, who has had a recent EGD and colonoscopy done by Dr. Elmo Putt, comes in after seeing Dr. Elmo Putt in the office for abdominal pain she was subsequently referred to  the ER. Patient has been having epigastric abdominal pain radiating to her lower abdominal quadrants ongoing since her lung biopsy surgery 10 days ago. She is also complaining of some nausea without any emesis, reports multiple formed bowel movements a day, she was checked for C. difficile PCR last Monday and it was negative, came to the ER with these complaints where her blood work along with CT scan of abdomen pelvis were unremarkable. Her was requested to admit the patient for further GI workup as suggested by Dr. Elmo Putt in the office.    Review of Systems    In addition to the HPI above,   No Fever-chills, No Headache, No changes with Vision or hearing, No problems swallowing food or Liquids, No Chest pain, Cough or Shortness of Breath, ++ Abdominal pain, No Nausea or Vommitting, Bowel movements multiple in a day and formed, No Blood in stool or Urine, No dysuria, No new skin rashes or bruises, No new joints pains-aches,  No new weakness, tingling, numbness in any extremity, No recent weight gain or loss, No polyuria, polydypsia or polyphagia, No significant Mental Stressors.  A full 10 point Review of Systems was done, except as stated above, all other Review of Systems were negative.   Social History History  Substance Use Topics  . Smoking status: Never Smoker   . Smokeless tobacco:  Never Used  . Alcohol Use: No      Family History Family History  Problem Relation Age of Onset  . Rheum arthritis Mother   . Rheum arthritis Sister   . Prostate cancer Brother   . Heart disease Maternal Grandmother   . Heart disease Maternal Grandfather   . Uterine cancer Daughter   . Colon cancer Neg Hx       Prior to Admission medications   Medication Sig Start Date End Date Taking? Authorizing Provider  acetaminophen (TYLENOL) 500 MG tablet Take 1,000 mg by mouth every 6 (six) hours as needed for moderate pain.   Yes Historical Provider, MD  Eszopiclone (ESZOPICLONE) 3 MG TABS Take 1 tablet (3 mg total) by mouth at bedtime. Take immediately before bedtime 08/05/13  Yes Juanito Doom, MD  FIBER PO Take 1 tablet by mouth daily.   Yes Historical Provider, MD  fluticasone (FLONASE) 50 MCG/ACT nasal spray Place 2 sprays into the nose daily.    Yes Historical Provider, MD  glipiZIDE (GLUCOTROL) 5 MG tablet Take 5 mg by mouth daily.   Yes Historical Provider, MD  Homeopathic Products (CHESTAL HONEY COUGH) SYRP Take 5 mLs by mouth every other day as needed. 08/03/13  Yes Nishant Dhungel, MD  hydrochlorothiazide (MICROZIDE) 12.5 MG capsule Take 12.5 mg by mouth daily.   Yes Historical Provider, MD  hydrocortisone cream 0.5 % Apply 1 application topically 2 (two) times daily as needed (for rash).   Yes Historical Provider, MD  levothyroxine (SYNTHROID, LEVOTHROID) 125 MCG tablet Take 125 mcg by mouth daily before breakfast.   Yes Historical Provider, MD  montelukast (SINGULAIR) 10 MG tablet Take 10 mg by mouth at bedtime.   Yes Historical Provider, MD  pantoprazole (PROTONIX) 40 MG tablet Take 1 tablet (40 mg total) by mouth 2 (two) times daily. 08/03/13  Yes Nishant Dhungel, MD  predniSONE (DELTASONE) 10 MG tablet Take 10 mg by mouth daily with breakfast. Start taking from 08/05/2013 08/03/13  Yes Nishant Dhungel, MD  Probiotic Product (PROBIOTIC DAILY PO) Take 2 capsules by mouth 2 (two)  times daily.   Yes Historical Provider,  MD  traMADol-acetaminophen (ULTRACET) 37.5-325 MG per tablet Take 1 tablet by mouth every 6 (six) hours as needed. 08/03/13  Yes Nishant Dhungel, MD  verapamil (VERELAN PM) 180 MG 24 hr capsule Take 180 mg by mouth daily.   Yes Historical Provider, MD  atovaquone (MEPRON) 750 MG/5ML suspension Take 10 mLs (1,500 mg total) by mouth daily with breakfast. First dose on 1/16 08/03/13   Nishant Dhungel, MD  mycophenolate (CELLCEPT) 500 MG tablet Take 1 tablet (500 mg total) by mouth 2 (two) times daily. 08/03/13   Nishant Dhungel, MD    Allergies  Allergen Reactions  . Codeine Hives and Swelling  . Demerol [Meperidine] Hives and Swelling  . Hydrocodone Nausea Only  . Sulfa Antibiotics Rash    Physical Exam  Vitals  Blood pressure 149/72, pulse 80, temperature 97.8 F (36.6 C), temperature source Oral, resp. rate 20, SpO2 95.00%.   1. General eldelry obese white female lying in bed in NAD,     2. Normal affect and insight, Not Suicidal or Homicidal, Awake Alert, Oriented X 3.  3. No F.N deficits, ALL C.Nerves Intact, Strength 5/5 all 4 extremities, Sensation intact all 4 extremities, Plantars down going.  4. Ears and Eyes appear Normal, Conjunctivae clear, PERRLA. Moist Oral Mucosa.  5. Supple Neck, No JVD, No cervical lymphadenopathy appriciated, No Carotid Bruits.  6. Symmetrical Chest wall movement, Good air movement bilaterally, CTAB.  7. RRR, No Gallops, Rubs or Murmurs, No Parasternal Heave.  8. Positive Bowel Sounds, Abdomen Soft, mild epigastric tenderness, No organomegaly appriciated,No rebound -guarding or rigidity.  9.  No Cyanosis, Normal Skin Turgor, No Skin Rash or Bruise.  10. Good muscle tone,  joints appear normal , no effusions, Normal ROM.  11. No Palpable Lymph Nodes in Neck or Axillae     Data Review  CBC  Recent Labs Lab 08/02/13 0540 08/08/13 1350  WBC 8.2 9.8  HGB 13.4 15.0  HCT 40.7 44.6  PLT 229 249    MCV 94.7 94.9  MCH 31.2 31.9  MCHC 32.9 33.6  RDW 13.9 13.6  LYMPHSABS  --  1.4  MONOABS  --  0.4  EOSABS  --  0.1  BASOSABS  --  0.0   ------------------------------------------------------------------------------------------------------------------  Chemistries   Recent Labs Lab 08/02/13 0540 08/08/13 1350  NA 141 135*  K 3.9 4.5  CL 100 98  CO2 29 23  GLUCOSE 128* 192*  BUN 10 9  CREATININE 0.63 0.57  CALCIUM 9.2 9.4  AST 12 15  ALT 18 22  ALKPHOS 54 69  BILITOT 0.5 0.5   ------------------------------------------------------------------------------------------------------------------ CrCl is unknown because both a height and weight (above a minimum accepted value) are required for this calculation. ------------------------------------------------------------------------------------------------------------------ No results found for this basename: TSH, T4TOTAL, FREET3, T3FREE, THYROIDAB,  in the last 72 hours   Coagulation profile No results found for this basename: INR, PROTIME,  in the last 168 hours ------------------------------------------------------------------------------------------------------------------- No results found for this basename: DDIMER,  in the last 72 hours -------------------------------------------------------------------------------------------------------------------  Cardiac Enzymes No results found for this basename: CK, CKMB, TROPONINI, MYOGLOBIN,  in the last 168 hours ------------------------------------------------------------------------------------------------------------------ No components found with this basename: POCBNP,    ---------------------------------------------------------------------------------------------------------------  Urinalysis    Component Value Date/Time   COLORURINE YELLOW 08/08/2013 [place] 08/08/2013 1519   LABSPEC 1.020 08/08/2013 [place] 6.5 08/08/2013 1519   GLUCOSEU  500* 08/08/2013 [place] 08/08/2013 [place] 08/08/2013 1519  KETONESUR NEGATIVE 08/08/2013 1519   PROTEINUR 30* 08/08/2013 1519   UROBILINOGEN 0.2 08/08/2013 1519   NITRITE NEGATIVE 08/08/2013 1519   LEUKOCYTESUR TRACE* 08/08/2013 1519    ----------------------------------------------------------------------------------------------------------------  Imaging results:   Dg Chest 2 View  08/08/2013   CLINICAL DATA:  Left chest pain.  Diabetes and hypertension.  EXAM: CHEST  2 VIEW  COMPARISON:  08/01/2013  FINDINGS: Heart size remains stable. Pulmonary interstitial prominence is stable. No evidence of acute infiltrate or edema. No evidence of pleural effusion. Mediastinal contours are stable.  IMPRESSION: Stable exam.  No active cardiopulmonary disease.   Electronically Signed   By: Earle Gell M.D.   On: 08/08/2013 16:14   Dg Chest 2 View  08/01/2013   CLINICAL DATA:  Dyspnea with fatigue and history of hypertension and obesity  EXAM: CHEST  2 VIEW  COMPARISON:  PA and lateral chest x-ray of July 12, 2013.  FINDINGS: The lungs are borderline hypoinflated. There is no focal infiltrate. The cardiopericardial silhouette is enlarged. The pulmonary vascularity is not engorged. There is no pleural effusion or pneumothorax. The mediastinum is normal in width. There is mild degenerative disc change of the mid and lower thoracic spine. There is no pleural effusion.  IMPRESSION: There is no evidence of pneumonia nor pulmonary edema. Mild enlargement of the cardiac silhouette is present and stable.   Electronically Signed   By: David  Martinique   On: 08/01/2013 16:49   Dg Chest 2 View  07/12/2013   CLINICAL DATA:  63 year old female with pain, weakness and dizziness. Initial encounter. Postinflammatory pulmonary fibrosis. Status post lung biopsy earlier this month.  EXAM: CHEST  2 VIEW  COMPARISON:  07/13/2013 and earlier.  FINDINGS: Stable to mildly improved lung volumes. Stable  cardiac size and mediastinal contours. Visualized tracheal air column is within normal limits. No pneumothorax, pulmonary edema, pleural effusion or consolidation. Stable to mild regression of peripheral and basilar interstitial markings, with residual opacity better depicted on the lateral view today. Stable visualized osseous structures.  IMPRESSION: No new cardiopulmonary abnormality with stable to mildly improved ventilation compared to recent exams.   Electronically Signed   By: Lars Pinks M.D.   On: 07/12/2013 12:42   Abd 1 View (kub)  08/01/2013   CLINICAL DATA:  Abdominal pain and history of constipation and colonic polyps  EXAM: ABDOMEN - 1 VIEW  COMPARISON:  None  FINDINGS: The bowel gas pattern suggests constipation. There is no evidence of ileus nor obstruction nor perforation. There are no abnormal soft tissue calcifications. There are surgical clips in the gallbladder fossa. There is mild degenerative disc change of the lumbar spine. There is gentle curvature of the lumbar spine with the convexity towards the right. The observed portions of the bony pelvis exhibit no acute abnormalities.  IMPRESSION: The bowel gas pattern suggests constipation. There is no evidence of ileus nor obstruction.   Electronically Signed   By: David  [place]   On: 08/01/2013 16:47   Ct Head Wo Contrast  08/02/2013   CLINICAL DATA:  Severe headache and dizziness. Altered mental status.  EXAM: CT HEAD WITHOUT CONTRAST  TECHNIQUE: Contiguous axial images were obtained from the base of the skull through the vertex without contrast.  COMPARISON:  None  FINDINGS: Normal appearance of the intracranial structures. No evidence for acute hemorrhage, mass lesion, midline shift, hydrocephalus or large infarct. No acute bony abnormality. The visualized sinuses are clear.  IMPRESSION: No acute intracranial abnormality.   Electronically Signed   By:  Rolla Flatten M.D.   On: 08/02/2013 18:30   Ct Abdomen Pelvis W Contrast  08/08/2013    CLINICAL DATA:  Intermittent upper abdominal pain.  EXAM: CT ABDOMEN AND PELVIS WITH CONTRAST  TECHNIQUE: Multidetector CT imaging of the abdomen and pelvis was performed using the standard protocol following bolus administration of intravenous contrast.  CONTRAST:  114mL OMNIPAQUE IOHEXOL 300 MG/ML  SOLN  COMPARISON:  DG ABDOMEN 1V dated 08/01/2013  FINDINGS: Linear densities in the lung bases, likely atelectasis. Heart is borderline in size. No pleural effusions.  Prior cholecystectomy. Liver, spleen, pancreas, adrenals are unremarkable.  Bilateral punctate nonobstructing renal stones. No ureteral stones or hydronephrosis. Urinary bladder is unremarkable. 5 cm left midpole cyst.  Transverse, descending colonic and sigmoid diverticulosis. No changes of active diverticulitis. Small bowel is decompressed. No free fluid, free air or adenopathy. Uterus and adnexa are unremarkable. Aorta is normal caliber.  No acute bony abnormality.  IMPRESSION: Colonic diverticulosis.  No active diverticulitis.  No acute findings in the abdomen or pelvis.  Prior cholecystectomy.  Bibasilar atelectasis.   Electronically Signed   By: Rolm Baptise M.D.   On: 08/08/2013 18:24    My personal review of EKG: Rhythm NSR,  , no Acute ST changes    Assessment & Plan   1. Epigastric abdominal pain history of H. pylori recently diagnosed. She is tender somewhat in the epigastric area, she claims to have finished H. pylori treatment recently, will place her on IV PPI, bowel rest, IV fluids, request GI to see. Pain control.    2.Intertitial pneumonitis. She's currently not taking CellCept due to GI issues. For now continue prednisone then outpatient followup with Dr. Mckinley Jewel.   3. Diabetes mellitus type 2. Since n.p.o. we'll place her on every 4 hour Accu-Cheks with sliding scale, check A1c, hold oral hypoglycemics.   4. Multiple bowel movements which are formed stools. Recheck C. difficile, was recently negative    5.  Hypertension. Stable continue home medications unchanged     DVT Prophylaxis Heparin    AM Labs Ordered, also please review Full Orders  Family Communication: Admission, patients condition and plan of care including tests being ordered have been discussed with the patient and daughters who indicate understanding and agree with the plan and Code Status.  Code Status full  Likely DC to home  Condition fair  Time spent in minutes : 35    Lala Lund K M.D on 08/08/2013 at 6:50 PM  Between 7am to 7pm - Pager - [phone]  After 7pm go to [email or web address] - password [email or web address]  And look for the night coverage person covering me after hours  [facility] Office  [phone]

## 2013-08-08 NOTE — Telephone Encounter (Signed)
Spoke with pt's caregiver. Reports SOB, headache, chest pain and issues with black stool. A call has been put in to Dr. Hilarie Fredrickson for the black stools. She is only sleeping 2 hours per night. Cellcept is to be started today but she is scared to take it with the symptoms that she is having. Caregiver is thinking that she needs to be in the hospital. They do not want to wait in the ER, they want to be a direct admit.  BQ - please advise. Thanks.

## 2013-08-09 DIAGNOSIS — K219 Gastro-esophageal reflux disease without esophagitis: Secondary | ICD-10-CM

## 2013-08-09 DIAGNOSIS — R109 Unspecified abdominal pain: Secondary | ICD-10-CM

## 2013-08-09 DIAGNOSIS — E119 Type 2 diabetes mellitus without complications: Secondary | ICD-10-CM

## 2013-08-09 DIAGNOSIS — G9340 Encephalopathy, unspecified: Secondary | ICD-10-CM

## 2013-08-09 DIAGNOSIS — R112 Nausea with vomiting, unspecified: Secondary | ICD-10-CM

## 2013-08-09 DIAGNOSIS — R1013 Epigastric pain: Secondary | ICD-10-CM

## 2013-08-09 LAB — HEMOGLOBIN A1C
Hgb A1c MFr Bld: 8.5 % — ABNORMAL HIGH (ref <5.7)
Mean Plasma Glucose: 197 mg/dL — ABNORMAL HIGH (ref <117)

## 2013-08-09 LAB — GLUCOSE, CAPILLARY
GLUCOSE-CAPILLARY: 136 mg/dL — AB (ref 70–99)
Glucose-Capillary: 215 mg/dL — ABNORMAL HIGH (ref 70–99)
Glucose-Capillary: 166 mg/dL — ABNORMAL HIGH (ref 70–99)
Glucose-Capillary: 147 mg/dL — ABNORMAL HIGH (ref 70–99)
Glucose-Capillary: 223 mg/dL — ABNORMAL HIGH (ref 70–99)
Glucose-Capillary: 172 mg/dL — ABNORMAL HIGH (ref 70–99)

## 2013-08-09 LAB — BASIC METABOLIC PANEL
BUN: 9 mg/dL (ref 6–23)
CALCIUM: 9.6 mg/dL (ref 8.4–10.5)
CO2: 27 meq/L (ref 19–32)
Chloride: 101 mEq/L (ref 96–112)
Creatinine, Ser: 0.65 mg/dL (ref 0.50–1.10)
GFR calc Af Amer: >90 mL/min (ref >90)
Glucose, Bld: 132 mg/dL — ABNORMAL HIGH (ref 70–99)
POTASSIUM: 4.1 meq/L (ref 3.7–5.3)
SODIUM: 141 meq/L (ref 137–147)

## 2013-08-09 LAB — CBC
HCT: 44.6 % (ref 36.0–46.0)
HEMOGLOBIN: 14.8 g/dL (ref 12.0–15.0)
MCH: 31.5 pg (ref 26.0–34.0)
MCHC: 33.2 g/dL (ref 30.0–36.0)
MCV: 94.9 fL (ref 78.0–100.0)
Platelets: 213 10*3/uL (ref 150–400)
RBC: 4.7 MIL/uL (ref 3.87–5.11)
RDW: 13.7 % (ref 11.5–15.5)
WBC: 7.2 10*3/uL (ref 4.0–10.5)

## 2013-08-09 LAB — LIPASE, BLOOD: LIPASE: 15 U/L (ref 11–59)

## 2013-08-09 LAB — CLOSTRIDIUM DIFFICILE BY PCR: CDIFFPCR: NEGATIVE

## 2013-08-09 MED ORDER — ARTIFICIAL TEARS OP OINT
TOPICAL_OINTMENT | OPHTHALMIC | Status: DC | PRN
  Filled 2013-08-09: qty 3.5

## 2013-08-09 MED ORDER — ATOVAQUONE 750 MG/5ML PO SUSP
1500.0000 mg | Freq: Every day | ORAL | Status: DC
  Administered 2013-08-09 – 2013-08-10 (×2): 1500 mg via ORAL
  Filled 2013-08-09 (×3): qty 10

## 2013-08-09 MED ORDER — HEPARIN SODIUM (PORCINE) 5000 UNIT/ML IJ SOLN
5000.0000 [IU] | Freq: Three times a day (TID) | INTRAMUSCULAR | Status: DC
  Administered 2013-08-09 – 2013-08-13 (×12): 5000 [IU] via SUBCUTANEOUS
  Filled 2013-08-09 (×15): qty 1

## 2013-08-09 MED ORDER — POLYVINYL ALCOHOL 1.4 % OP SOLN
1.0000 [drp] | OPHTHALMIC | Status: DC | PRN
  Administered 2013-08-09 – 2013-08-13 (×2): 1 [drp] via OPHTHALMIC
  Filled 2013-08-09: qty 15

## 2013-08-09 MED ORDER — SODIUM CHLORIDE 0.9 % IV SOLN
INTRAVENOUS | Status: AC
  Administered 2013-08-09: 10:00:00 via INTRAVENOUS

## 2013-08-09 MED ORDER — HYDROCODONE-ACETAMINOPHEN 5-325 MG PO TABS: 1.0000 | ORAL_TABLET | ORAL | Status: DC | PRN

## 2013-08-09 MED ORDER — DIPHENHYDRAMINE HCL 50 MG/ML IJ SOLN
12.5000 mg | Freq: Once | INTRAMUSCULAR | Status: AC
  Administered 2013-08-09: 12.5 mg via INTRAVENOUS
  Filled 2013-08-09: qty 1

## 2013-08-09 MED ORDER — TRAMADOL HCL 50 MG PO TABS
50.0000 mg | ORAL_TABLET | Freq: Four times a day (QID) | ORAL | Status: DC | PRN
  Administered 2013-08-09 – 2013-08-13 (×6): 50 mg via ORAL
  Filled 2013-08-09 (×6): qty 1

## 2013-08-09 MED ORDER — LEVOTHYROXINE SODIUM 125 MCG PO TABS
125.0000 ug | ORAL_TABLET | Freq: Every day | ORAL | Status: DC
  Administered 2013-08-09 – 2013-08-13 (×5): 125 ug via ORAL
  Filled 2013-08-09 (×6): qty 1

## 2013-08-09 MED ORDER — GUAIFENESIN-DM 100-10 MG/5ML PO SYRP: 5.0000 mL | ORAL_SOLUTION | ORAL | Status: DC | PRN

## 2013-08-09 MED ORDER — HYDROMORPHONE HCL PF 1 MG/ML IJ SOLN
0.5000 mg | INTRAMUSCULAR | Status: DC | PRN
  Administered 2013-08-09 – 2013-08-10 (×2): 0.5 mg via INTRAVENOUS
  Filled 2013-08-09 (×3): qty 1

## 2013-08-09 MED ORDER — VERAPAMIL HCL ER 180 MG PO TBCR
180.0000 mg | EXTENDED_RELEASE_TABLET | Freq: Every day | ORAL | Status: DC
  Administered 2013-08-09 – 2013-08-13 (×5): 180 mg via ORAL
  Filled 2013-08-09 (×5): qty 1

## 2013-08-09 MED ORDER — DICYCLOMINE HCL 10 MG PO CAPS
10.0000 mg | ORAL_CAPSULE | Freq: Four times a day (QID) | ORAL | Status: DC | PRN
Start: 2013-08-09 — End: 2013-08-13
  Filled 2013-08-09: qty 1

## 2013-08-09 MED ORDER — INSULIN ASPART 100 UNIT/ML ~~LOC~~ SOLN
0.0000 [IU] | SUBCUTANEOUS | Status: DC
  Administered 2013-08-09: 3 [IU] via SUBCUTANEOUS
  Administered 2013-08-09: 1 [IU] via SUBCUTANEOUS
  Administered 2013-08-09: 2 [IU] via SUBCUTANEOUS
  Filled 2013-08-09 (×3): qty 1

## 2013-08-09 MED ORDER — ONDANSETRON HCL 4 MG/2ML IJ SOLN
4.0000 mg | Freq: Four times a day (QID) | INTRAMUSCULAR | Status: DC | PRN
  Administered 2013-08-10: 4 mg via INTRAVENOUS
  Filled 2013-08-09: qty 2

## 2013-08-09 MED ORDER — ACETAMINOPHEN 325 MG PO TABS
650.0000 mg | ORAL_TABLET | ORAL | Status: DC | PRN
  Administered 2013-08-11 – 2013-08-12 (×3): 650 mg via ORAL
  Filled 2013-08-09 (×4): qty 2

## 2013-08-09 MED ORDER — PANTOPRAZOLE SODIUM 40 MG IV SOLR
40.0000 mg | Freq: Two times a day (BID) | INTRAVENOUS | Status: DC
  Administered 2013-08-09 – 2013-08-13 (×9): 40 mg via INTRAVENOUS
  Filled 2013-08-09 (×10): qty 40

## 2013-08-09 MED ORDER — PREDNISONE 10 MG PO TABS
10.0000 mg | ORAL_TABLET | Freq: Every day | ORAL | Status: DC
  Administered 2013-08-10: 10 mg via ORAL
  Filled 2013-08-09 (×3): qty 1

## 2013-08-09 MED ORDER — ZOLPIDEM TARTRATE 5 MG PO TABS: 5.0000 mg | ORAL_TABLET | Freq: Every day | ORAL | Status: DC

## 2013-08-09 MED ORDER — INSULIN ASPART 100 UNIT/ML ~~LOC~~ SOLN
0.0000 [IU] | Freq: Three times a day (TID) | SUBCUTANEOUS | Status: DC
  Administered 2013-08-10: 2 [IU] via SUBCUTANEOUS
  Administered 2013-08-10: 3 [IU] via SUBCUTANEOUS
  Administered 2013-08-10: 5 [IU] via SUBCUTANEOUS
  Administered 2013-08-11: 3 [IU] via SUBCUTANEOUS
  Administered 2013-08-11 (×2): 2 [IU] via SUBCUTANEOUS
  Administered 2013-08-12: 3 [IU] via SUBCUTANEOUS
  Administered 2013-08-12: 2 [IU] via SUBCUTANEOUS
  Administered 2013-08-13 (×2): 3 [IU] via SUBCUTANEOUS

## 2013-08-09 MED ORDER — ONDANSETRON HCL 4 MG PO TABS: 4.0000 mg | ORAL_TABLET | Freq: Four times a day (QID) | ORAL | Status: DC | PRN

## 2013-08-09 MED ORDER — HYDROCORTISONE 0.5 % EX CREA
1.0000 "application " | TOPICAL_CREAM | Freq: Two times a day (BID) | CUTANEOUS | Status: DC | PRN
  Filled 2013-08-09: qty 28.35

## 2013-08-09 MED ORDER — VERAPAMIL HCL ER 180 MG PO CP24: 180.0000 mg | ORAL_CAPSULE | Freq: Every day | ORAL | Status: DC

## 2013-08-09 NOTE — ED Notes (Signed)
Pt requesting benadryl for itching.  Hospitalist paged for orders.

## 2013-08-09 NOTE — Progress Notes (Signed)
UR completed 

## 2013-08-09 NOTE — ED Notes (Signed)
Pt placed in hospital bed for comfort.

## 2013-08-09 NOTE — Consult Note (Signed)
Consultation  Referring Provider: [facility] (Lala Lund, MD) Primary Care Physician:  [name], MD Primary Gastroenterologist:  Zenovia Jarred, MD       Reason for Consultation: abdominal pain          HPI:   Kaitlin Hamilton is a 63 y.o. female seen as a new patient in our office November 2014 for evaluation of reflux, dysphagia and esophageal dysmotility (esophagram Oct 2014)).  Patient underwent EGD and screening colonoscopy Nov 2014.   Patient was seen in our office again on the 9th of this month, this time for increased frequency of stools associated with crampy abdominal discomfort. Patient has interstitial lung disease and had been on prednisone, bactrim and cellcept. She had also been on antibiotics for H.pylori in the recent past. Patient was treated with Bentyl, probiotics and given an empirical course of flagyl. C-diff was negative. Patient took 3-4 days of flagyl then it was stopped after she was admitted to hospital  08/01/13 to [birth year] with metabolic encephalopathy, abdominal pain and nausea. Ammonia, TSH and other labs unremarkable. No acute infection found. Prednisone was tapered, Cellcept held, Bactrim discontinued. Mental status returned to normal.    Patient called our office yesterday after recent passage of black stool x1 episode. Otherwise stools have been dark green at home. She was directed to ED where CMET and CBC were normal except for glucose of 192. CTscan with contrast negative for acute abnormalities, just colonic diverticulosis. She continues to have diffuse abdominal pain and increased frequency of unformed stools. Having a lot of nocturnal BMs. Eating seems to cause increased abdominal discomfort. Defecation doesn't reliably help the pain. She continues to have frequent nausea with occasional vomiting. Her close friend Dorian Pod is sure the bowel changes began just after completion of antibiotics late December for h.pylori. She also started Cellcept around that  time. Cellcept was stopped during recent hospital admission, her last dose was about a week ago. She took her first dose of Mepron today. No improvement in abdominal pain or BMs off cellcept. There has been mild weight loss of about 8 pounds over last few weeks.   .  Past Medical History  Diagnosis Date  . Bronchitis   . Chicken pox   . Depression   . Allergic rhinitis     uses Flonase daily  . Hyperlipidemia     lost 43 pounds and no meds required at present  . Hypokalemia   . Anxiety   . PONV (postoperative nausea and vomiting)   . Hypertension     takes Verapamil and HCTZ daily  . Shortness of breath     with exertion;takes Singulair daily as well as Flonase  . Pneumonia     last time in 2006  . History of bronchitis     2013  . H/O hiatal hernia   . Migraines     last one about a month ago  . Dysphagia   . Joint swelling     left thumb  . OA (osteoarthritis)     left knee  . GERD (gastroesophageal reflux disease)     takes Protonix daily  . Hemorrhoids   . History of colon polyps   . Diverticulosis   . Urinary frequency   . History of kidney stones   . Non-insulin dependent type 2 diabetes mellitus     takes Glipizide daily  . Constipation     takes Fiber daily  . Hypothyroidism (acquired)     takes  Synthroid daily  . Insomnia     doesn't take any meds for this    Past Surgical History  Procedure Laterality Date  . Tubal ligation    . Cholecystectomy    . Appendectomy    . Abdominal exploration surgery      For Ovarian Cyst   . Left knee arthroscopy    . Esophagogastroduodenoscopy    . Tcs    . Lithotripsy      x 2  . Video assisted thoracoscopy Right 06/29/2013    Procedure: VIDEO ASSISTED THORACOSCOPY;  Surgeon: Loreli Slot, MD;  Location: [facility] OR;  Service: Thoracic;  Laterality: Right;  . Lung biopsy Right 06/29/2013    Procedure: LUNG BIOPSY;  Surgeon: Loreli Slot, MD;  Location: [facility] OR;  Service: Thoracic;  Laterality: Right;     Family History  Problem Relation Age of Onset  . Rheum arthritis Mother   . Rheum arthritis Sister   . Prostate cancer Brother   . Heart disease Maternal Grandmother   . Heart disease Maternal Grandfather   . Uterine cancer Daughter   . Colon cancer Neg Hx      History  Substance Use Topics  . Smoking status: Never Smoker   . Smokeless tobacco: Never Used  . Alcohol Use: No    Prior to Admission medications   Medication Sig Start Date End Date Taking? Authorizing Provider  acetaminophen (TYLENOL) 500 MG tablet Take 1,000 mg by mouth every 6 (six) hours as needed for moderate pain.   Yes Historical Provider, MD  Eszopiclone (ESZOPICLONE) 3 MG TABS Take 1 tablet (3 mg total) by mouth at bedtime. Take immediately before bedtime 08/05/13  Yes Lupita Leash, MD  FIBER PO Take 1 tablet by mouth daily.   Yes Historical Provider, MD  fluticasone (FLONASE) 50 MCG/ACT nasal spray Place 2 sprays into the nose daily.    Yes Historical Provider, MD  glipiZIDE (GLUCOTROL) 5 MG tablet Take 5 mg by mouth daily.   Yes Historical Provider, MD  Homeopathic Products (CHESTAL HONEY COUGH) SYRP Take 5 mLs by mouth every other day as needed. 08/03/13  Yes Nishant Dhungel, MD  hydrochlorothiazide (MICROZIDE) 12.5 MG capsule Take 12.5 mg by mouth daily.   Yes Historical Provider, MD  hydrocortisone cream 0.5 % Apply 1 application topically 2 (two) times daily as needed (for rash).   Yes Historical Provider, MD  levothyroxine (SYNTHROID, LEVOTHROID) 125 MCG tablet Take 125 mcg by mouth daily before breakfast.   Yes Historical Provider, MD  montelukast (SINGULAIR) 10 MG tablet Take 10 mg by mouth at bedtime.   Yes Historical Provider, MD  pantoprazole (PROTONIX) 40 MG tablet Take 1 tablet (40 mg total) by mouth 2 (two) times daily. 08/03/13  Yes Nishant Dhungel, MD  predniSONE (DELTASONE) 10 MG tablet Take 10 mg by mouth daily with breakfast. Start taking from 08/05/2013 08/03/13  Yes Nishant Dhungel, MD   Probiotic Product (PROBIOTIC DAILY PO) Take 2 capsules by mouth 2 (two) times daily.   Yes Historical Provider, MD  traMADol-acetaminophen (ULTRACET) 37.5-325 MG per tablet Take 1 tablet by mouth every 6 (six) hours as needed. 08/03/13  Yes Nishant Dhungel, MD  verapamil (VERELAN PM) 180 MG 24 hr capsule Take 180 mg by mouth daily.   Yes Historical Provider, MD  atovaquone (MEPRON) 750 MG/5ML suspension Take 10 mLs (1,500 mg total) by mouth daily with breakfast. First dose on 1/16 08/03/13   Nishant Dhungel, MD  mycophenolate (CELLCEPT) 500 MG  tablet Take 1 tablet (500 mg total) by mouth 2 (two) times daily. 08/03/13   Nishant Dhungel, MD    Current Facility-Administered Medications  Medication Dose Route Frequency Provider Last Rate Last Dose  . 0.9 %  sodium chloride infusion   Intravenous Continuous Thurnell Lose, MD 75 mL/hr at 08/09/13 0947    . acetaminophen (TYLENOL) tablet 650 mg  650 mg Oral Q4H PRN Rhetta Mura Schorr, NP      . atovaquone (MEPRON) 750 MG/5ML suspension 1,500 mg  1,500 mg Oral Q breakfast Thurnell Lose, MD      . HYDROmorphone (DILAUDID) injection 0.5 mg  0.5 mg Intravenous Q4H PRN Theodis Blaze, MD   0.5 mg at 08/09/13 2683  . insulin aspart (novoLOG) injection 0-9 Units  0-9 Units Subcutaneous Q4H Thurnell Lose, MD   2 Units at 08/09/13 0702  . levothyroxine (SYNTHROID, LEVOTHROID) tablet 125 mcg  125 mcg Oral QAC breakfast Thurnell Lose, MD      . ondansetron [facility]) tablet 4 mg  4 mg Oral Q6H PRN Thurnell Lose, MD       Or  . ondansetron (ZOFRAN) injection 4 mg  4 mg Intravenous Q6H PRN Thurnell Lose, MD      . pantoprazole (PROTONIX) injection 40 mg  40 mg Intravenous Q12H Thurnell Lose, MD      . polyvinyl alcohol (LIQUIFILM TEARS) 1.4 % ophthalmic solution 1 drop  1 drop Both Eyes PRN Theodis Blaze, MD   1 drop at 08/09/13 0844  . [START ON 08/10/2013] predniSONE (DELTASONE) tablet 10 mg  10 mg Oral Q breakfast Thurnell Lose, MD      .  traMADol Veatrice Bourbon) tablet 50 mg  50 mg Oral Q6H PRN Rhetta Mura Schorr, NP      . verapamil (CALAN-SR) CR tablet 180 mg  180 mg Oral Daily Nishant Dhungel, MD       Current Outpatient Prescriptions  Medication Sig Dispense Refill  . acetaminophen (TYLENOL) 500 MG tablet Take 1,000 mg by mouth every 6 (six) hours as needed for moderate pain.      . Eszopiclone (ESZOPICLONE) 3 MG TABS Take 1 tablet (3 mg total) by mouth at bedtime. Take immediately before bedtime  5 tablet  0  . FIBER PO Take 1 tablet by mouth daily.      . fluticasone (FLONASE) 50 MCG/ACT nasal spray Place 2 sprays into the nose daily.       Marland Kitchen glipiZIDE (GLUCOTROL) 5 MG tablet Take 5 mg by mouth daily.      . Homeopathic Products (CHESTAL HONEY COUGH) SYRP Take 5 mLs by mouth every other day as needed.  120 mL  1  . hydrochlorothiazide (MICROZIDE) 12.5 MG capsule Take 12.5 mg by mouth daily.      . hydrocortisone cream 0.5 % Apply 1 application topically 2 (two) times daily as needed (for rash).      Marland Kitchen levothyroxine (SYNTHROID, LEVOTHROID) 125 MCG tablet Take 125 mcg by mouth daily before breakfast.      . montelukast (SINGULAIR) 10 MG tablet Take 10 mg by mouth at bedtime.      . pantoprazole (PROTONIX) 40 MG tablet Take 1 tablet (40 mg total) by mouth 2 (two) times daily.  90 tablet  3  . predniSONE (DELTASONE) 10 MG tablet Take 10 mg by mouth daily with breakfast. Start taking from 08/05/2013      . Probiotic Product (PROBIOTIC DAILY PO) Take 2  capsules by mouth 2 (two) times daily.      . traMADol-acetaminophen (ULTRACET) 37.5-325 MG per tablet Take 1 tablet by mouth every 6 (six) hours as needed.  30 tablet  0  . verapamil (VERELAN PM) 180 MG 24 hr capsule Take 180 mg by mouth daily.      Marland Kitchen atovaquone (MEPRON) 750 MG/5ML suspension Take 10 mLs (1,500 mg total) by mouth daily with breakfast. First dose on 1/16  210 mL  2  . mycophenolate (CELLCEPT) 500 MG tablet Take 1 tablet (500 mg total) by mouth 2 (two) times daily.  60  tablet  2    Allergies as of 08/08/2013 - Review Complete 08/08/2013  Allergen Reaction Noted  . Codeine Hives and Swelling 10/19/2012  . Demerol [meperidine] Hives and Swelling 10/19/2012  . Hydrocodone Nausea Only 08/01/2013  . Sulfa antibiotics Rash 08/01/2013    Review of Systems:    All systems reviewed and negative except where noted in HPI.   Physical Exam:  Vital signs in last 24 hours: Temp:  [97.6 F (36.4 C)-98.7 F (37.1 C)] 97.6 F (36.4 C) (01/20 0958) Pulse Rate:  [74-88] 74 (01/20 0958) Resp:  [16-20] 18 (01/20 0958) BP: (124-155)/(68-96) 124/70 mmHg (01/20 0958) SpO2:  [95 %-98 %] 96 % (01/20 0958)   General:   Pleasant white female in NAD Head:  Normocephalic and atraumatic. Eyes:   No icterus.   Conjunctiva pink. Ears:  Normal auditory acuity. Neck:  Supple; no masses felt Lungs:  Respirations even and unlabored. A few velcro type sounds in RLL, o/w CTA.  Heart:  Regular rate and rhythm Abdomen:  Soft, obese, nondistended, mild to moderate RLQ tenderness. Normal bowel sounds. No appreciable masses or hepatomegaly.  Msk:  Symmetrical without gross deformities.  Extremities:  Without edema. Neurologic:  Alert and  oriented x4;  grossly normal neurologically. Skin:  Intact without significant lesions or rashes. Cervical Nodes:  No significant cervical adenopathy. Psych:  Alert and cooperative. Normal affect.  LAB RESULTS:  Recent Labs  08/08/13 1350 08/09/13 0945  WBC 9.8 7.2  HGB 15.0 14.8  HCT 44.6 44.6  PLT 249 213   BMET  Recent Labs  08/08/13 1350 08/09/13 0945  NA 135* 141  K 4.5 4.1  CL 98 101  CO2 23 27  GLUCOSE 192* 132*  BUN 9 9  CREATININE 0.57 0.65  CALCIUM 9.4 9.6   LFT  Recent Labs  08/08/13 1350  PROT 7.3  ALBUMIN 3.8  AST 15  ALT 22  ALKPHOS 69  BILITOT 0.5   STUDIES: Dg Chest 2 View  08/08/2013   CLINICAL DATA:  Left chest pain.  Diabetes and hypertension.  EXAM: CHEST  2 VIEW  COMPARISON:  08/01/2013   FINDINGS: Heart size remains stable. Pulmonary interstitial prominence is stable. No evidence of acute infiltrate or edema. No evidence of pleural effusion. Mediastinal contours are stable.  IMPRESSION: Stable exam.  No active cardiopulmonary disease.   Electronically Signed   By: Earle Gell M.D.   On: 08/08/2013 16:14   Ct Abdomen Pelvis W Contrast  08/08/2013   CLINICAL DATA:  Intermittent upper abdominal pain.  EXAM: CT ABDOMEN AND PELVIS WITH CONTRAST  TECHNIQUE: Multidetector CT imaging of the abdomen and pelvis was performed using the standard protocol following bolus administration of intravenous contrast.  CONTRAST:  148mL OMNIPAQUE IOHEXOL 300 MG/ML  SOLN  COMPARISON:  DG ABDOMEN 1V dated 08/01/2013  FINDINGS: Linear densities in the lung bases, likely atelectasis. Heart  is borderline in size. No pleural effusions.  Prior cholecystectomy. Liver, spleen, pancreas, adrenals are unremarkable.  Bilateral punctate nonobstructing renal stones. No ureteral stones or hydronephrosis. Urinary bladder is unremarkable. 5 cm left midpole cyst.  Transverse, descending colonic and sigmoid diverticulosis. No changes of active diverticulitis. Small bowel is decompressed. No free fluid, free air or adenopathy. Uterus and adnexa are unremarkable. Aorta is normal caliber.  No acute bony abnormality.  IMPRESSION: Colonic diverticulosis.  No active diverticulitis.  No acute findings in the abdomen or pelvis.  Prior cholecystectomy.  Bibasilar atelectasis.   Electronically Signed   By: Rolm Baptise M.D.   On: 08/08/2013 18:24    PREVIOUS ENDOSCOPIES:            EGD Nov 2014 ENDOSCOPIC IMPRESSION: 1.   Schatzki ring was found 35 cm from the incisors.  Balloondilation to 18 mm, 4 quadrant biopsies at the ring 2.   The esophagus was otherwise normal. 3.   4 cm hiatal hernia 4.   Gastropathy was found in the gastric body and gastric antrum;multiple biopsies 5.   The duodenal mucosa showed no abnormalities in the bulb  andsecond portion of the duodenum   Colonoscopy Nov 2014 ENDOSCOPIC IMPRESSION: 1.   Two sessile polyps measuring 4-6 mm in size were found in the ascending colon; polypectomy was performed with a cold snare andusing snare cautery 2.   Two sessile polyps ranging between 3-54mm in size were found inthe rectosigmoid colon and rectum; Polypectomy was performed using cold snare 3.   There was severe diverticulosis noted in the ascending colon,descending colon, and sigmoid colon    Impression / Plan:   53. 63 year old female with three history of diffuse abdominal pain associated with increased frequency of non-diarrheal stools. TSH ok. Several variables to consider. She has taken several different antibiotics over last several weeks. She has been on and off Cellcept for last few weeks and Cellcept can cause diarrhea. She has also been on prednisone. Though not done for bowel changes, no colitis or masses on screening colonoscopy November 2014.  Patient has been on immunosuppressive medications, even though c-diff negative she is at risk for other enteric pathogens. Will check stool C+S, O+P, lactoferrin. Patient only completed 3-4 days of empirical flagyl prescribed at 07/29/13 visit. Following stool collection it may be worthwhile restarting flagyl. PPIs can cause increased frequency of stools but wouldn't expect associated abdominal pain.  2. Usual interstitial pneumonia per recent lung biopsy. She is being treated by pulmonary.   Thanks   LOS: 1 day   Tye Savoy  08/09/2013, 10:58 AM Attending MD note:   I have taken a history, examined the patient, and reviewed the chart. I agree with the Advanced Practitioner's impression and recommendations. Recent onset soft frequent stools associated with crampy abd.pain and 8 lb weight loss. . Will obtain stool studies to r/o infectious/ inflammatory cause. Add antispasmodics, Consider drug induced diarrhea since all symptoms started with Cellcept.Check  sprue profile.  [facility] Pager # [phone]

## 2013-08-09 NOTE — Progress Notes (Signed)
   CARE MANAGEMENT ED NOTE 08/09/2013  Patient:  Kaitlin Hamilton, Kaitlin Hamilton   Account Number:  [id number]  Date Initiated:  08/09/2013  Documentation initiated by:  Jackelyn Poling  Subjective/Objective Assessment:   63 yr old bcbs [place] ppo c/o abdominal, headache & chest pain sent from Dr Hilarie Fredrickson office (evaluated for abdominal pain)     Subjective/Objective Assessment Detail:   pcp bedsole     Action/Plan:   UR completed   Action/Plan Detail:   Anticipated DC Date:  08/10/2013     Status Recommendation to Physician:   Result of Recommendation:    Other ED Services  Consult Working [place]  Other    Choice offered to / List presented to:            Status of service:  Completed, signed off  ED Comments:   ED Comments Detail:

## 2013-08-09 NOTE — ED Notes (Signed)
Patient resting, waiting on admission bed. Family at bedside.

## 2013-08-09 NOTE — Progress Notes (Signed)
[facility] PROGRESS NOTE  DOREEN GARRETSON FXT:[id number] DOB: [birth year] DOA: 08/08/2013 PCP: [name], MD   Brief narrative 63 y.o. female, unusual interstitial pneumonitis with recent lung biopsy (followed by Dr. [name]) diabetes mellitus type 2, GERD, recently diagnosed with H. pylori infection, chronic insomnia and headaches, who  had a recent EGD and colonoscopy done by Dr. Hilarie Fredrickson, comes in after seeing Dr. Hilarie Fredrickson in the office for abdominal pain. she was subsequently referred to the ER. Patient has been having epigastric abdominal pain radiating to her lower abdominal quadrants ongoing since her lung biopsy 10 days ago. She also reported nausea without any vomiting and multiple formed bowel movements. CT checked one week back was negative.  Assessment/Plan: Epigastric pain Patient has epigastric tenderness to exam. She was recently diagnosed of a spider the and reports completing treatment for this. Continue IV PPI. Will start on full liquids. Pain control. [facility] following. Patient on low dose prednisone for her pneumonitis. Her symptoms could be related to CellCept as it can cause abdominal pain in upto >60% cases. I will consult pulmonary as well. We may need to stop cellcept for now. -stool studies sent.  -Started on full liquids   Intertitial pneumonitis.  CellCept was discontinued given her GI issues during recent hospitalization. Will discuss and plan with her pulmonologist Dr. [name]. Continue low-dose prednisone.   3. Diabetes mellitus type 2.  Will place on sliding scale insulin  ? Diarrhea Check stool studies   Hypertension.   Resume home medications   Code Status: Full code Family Communication: None at bedside  Disposition Plan:  Home once improved   Consultants:  GI    Procedures:  1  Antibiotics:  None  HPI/Subjective: Patient seen and examined. Reports epigastric pain to be slightly better. Denies nausea or  vomiting.  Objective: Filed Vitals:   08/09/13 1352  BP: 133/64  Pulse: 84  Temp: 98.2 F (36.8 C)  Resp: 18   No intake or output data in the 24 hours ending 08/09/13 1425 There were no vitals filed for this visit.  Exam:   General:  Elderly obese female lying in bed in no acute distress  HEENT: No pallor, moist oral mucosa  His total critical auscultation bilaterally, no added sounds  CVS: Normal S1-S2, no murmur rub or gallop  Abdomen: Soft, epigastric tenderness nondistended, bowel sounds present  Extremities: Warm, no edema  CNS: AAO x3  Data Reviewed: Basic Metabolic Panel:  Recent Labs Lab 08/08/13 1350 08/09/13 0945  NA 135* 141  K 4.5 4.1  CL 98 101  CO2 23 27  GLUCOSE 192* 132*  BUN 9 9  CREATININE 0.57 0.65  CALCIUM 9.4 9.6   Liver Function Tests:  Recent Labs Lab 08/08/13 1350  AST 15  ALT 22  ALKPHOS 69  BILITOT 0.5  PROT 7.3  ALBUMIN 3.8    Recent Labs Lab 08/09/13 0945  LIPASE 15   No results found for this basename: AMMONIA,  in the last 168 hours CBC:  Recent Labs Lab 08/08/13 1350 08/09/13 0945  WBC 9.8 7.2  NEUTROABS 8.0*  --   HGB 15.0 14.8  HCT 44.6 44.6  MCV 94.9 94.9  PLT 249 213   Cardiac Enzymes: No results found for this basename: CKTOTAL, CKMB, CKMBINDEX, TROPONINI,  in the last 168 hours BNP (last 3 results) No results found for this basename: PROBNP,  in the last 8760 hours CBG:  Recent Labs Lab 08/08/13 1325 08/09/13 0147 08/09/13 0648 08/09/13  3149 08/09/13 1143  GLUCAP 182* 215* 166* 136* 147*    Recent Results (from the past 240 hour(s))  URINE CULTURE     Status: None   Collection Time    08/01/13  5:04 PM      Result Value Range Status   Specimen Description URINE, CATHETERIZED   Final   Special Requests Immunocompromised   Final   Culture  Setup Time     Final   Value: 08/01/2013 21:10     Performed at [place]     Final   Value: NO GROWTH      Performed at [facility]   Culture     Final   Value: NO GROWTH     Performed at [facility]   Report Status 08/02/2013 FINAL   Final  CLOSTRIDIUM DIFFICILE BY PCR     Status: None   Collection Time    08/09/13 11:26 AM      Result Value Range Status   C difficile by pcr NEGATIVE  NEGATIVE Final   Comment: Performed at [facility]     Studies: Dg Chest 2 View  08/08/2013   CLINICAL DATA:  Left chest pain.  Diabetes and hypertension.  EXAM: CHEST  2 VIEW  COMPARISON:  08/01/2013  FINDINGS: Heart size remains stable. Pulmonary interstitial prominence is stable. No evidence of acute infiltrate or edema. No evidence of pleural effusion. Mediastinal contours are stable.  IMPRESSION: Stable exam.  No active cardiopulmonary disease.   Electronically Signed   By: Earle Gell M.D.   On: 08/08/2013 16:14   Ct Abdomen Pelvis W Contrast  08/08/2013   CLINICAL DATA:  Intermittent upper abdominal pain.  EXAM: CT ABDOMEN AND PELVIS WITH CONTRAST  TECHNIQUE: Multidetector CT imaging of the abdomen and pelvis was performed using the standard protocol following bolus administration of intravenous contrast.  CONTRAST:  147mL OMNIPAQUE IOHEXOL 300 MG/ML  SOLN  COMPARISON:  DG ABDOMEN 1V dated 08/01/2013  FINDINGS: Linear densities in the lung bases, likely atelectasis. Heart is borderline in size. No pleural effusions.  Prior cholecystectomy. Liver, spleen, pancreas, adrenals are unremarkable.  Bilateral punctate nonobstructing renal stones. No ureteral stones or hydronephrosis. Urinary bladder is unremarkable. 5 cm left midpole cyst.  Transverse, descending colonic and sigmoid diverticulosis. No changes of active diverticulitis. Small bowel is decompressed. No free fluid, free air or adenopathy. Uterus and adnexa are unremarkable. Aorta is normal caliber.  No acute bony abnormality.  IMPRESSION: Colonic diverticulosis.  No active diverticulitis.  No acute findings in the abdomen or pelvis.   Prior cholecystectomy.  Bibasilar atelectasis.   Electronically Signed   By: Rolm Baptise M.D.   On: 08/08/2013 18:24    Scheduled Meds: . atovaquone  1,500 mg Oral Q breakfast  . heparin  5,000 Units Subcutaneous Q8H  . insulin aspart  0-9 Units Subcutaneous Q4H  . levothyroxine  125 mcg Oral QAC breakfast  . pantoprazole (PROTONIX) IV  40 mg Intravenous Q12H  . [START ON 08/10/2013] predniSONE  10 mg Oral Q breakfast  . verapamil  180 mg Oral Daily  . zolpidem  5 mg Oral QHS   Continuous Infusions: . sodium chloride 75 mL/hr at 08/09/13 0947      Time spent: [place], [place]  [facility] Pager [phone] If 7PM-7AM, please contact night-coverage at [email or web address], password [facility] 08/09/2013, 2:25 PM  LOS: 1 day

## 2013-08-10 DIAGNOSIS — T887XXA Unspecified adverse effect of drug or medicament, initial encounter: Secondary | ICD-10-CM

## 2013-08-10 DIAGNOSIS — G9340 Encephalopathy, unspecified: Secondary | ICD-10-CM

## 2013-08-10 DIAGNOSIS — J8410 Pulmonary fibrosis, unspecified: Secondary | ICD-10-CM

## 2013-08-10 LAB — FECAL LACTOFERRIN, QUANT: Fecal Lactoferrin: NEGATIVE

## 2013-08-10 LAB — GLUCOSE, CAPILLARY
GLUCOSE-CAPILLARY: 137 mg/dL — AB (ref 70–99)
GLUCOSE-CAPILLARY: 219 mg/dL — AB (ref 70–99)
Glucose-Capillary: 176 mg/dL — ABNORMAL HIGH (ref 70–99)

## 2013-08-10 LAB — GLIADIN ANTIBODIES, SERUM
GLIADIN IGA: 3.8 U/mL (ref <20)
GLIADIN IGG: 2.5 U/mL (ref <20)

## 2013-08-10 LAB — OVA AND PARASITE EXAMINATION: OVA AND PARASITES: NONE SEEN

## 2013-08-10 LAB — TISSUE TRANSGLUTAMINASE, IGA: Tissue Transglutaminase Ab, IgA: 3.9 U/mL (ref <20)

## 2013-08-10 MED ORDER — PHENYLEPH-SHARK LIV OIL-MO-PET 0.25-3-14-71.9 % RE OINT
TOPICAL_OINTMENT | Freq: Two times a day (BID) | RECTAL | Status: DC | PRN
  Filled 2013-08-10: qty 28.4

## 2013-08-10 MED ORDER — METOCLOPRAMIDE HCL 5 MG/ML IJ SOLN: 10.0000 mg | Freq: Once | INTRAMUSCULAR | Status: DC

## 2013-08-10 MED ORDER — TRAZODONE HCL 50 MG PO TABS
50.0000 mg | ORAL_TABLET | Freq: Every day | ORAL | Status: DC
  Administered 2013-08-10 – 2013-08-12 (×3): 50 mg via ORAL
  Filled 2013-08-10 (×5): qty 1

## 2013-08-10 MED ORDER — DIPHENHYDRAMINE HCL 50 MG/ML IJ SOLN: 25.0000 mg | Freq: Once | INTRAMUSCULAR | Status: DC

## 2013-08-10 MED ORDER — KETOROLAC TROMETHAMINE 60 MG/2ML IM SOLN
60.0000 mg | Freq: Once | INTRAMUSCULAR | Status: AC
  Administered 2013-08-10: 60 mg via INTRAMUSCULAR
  Filled 2013-08-10: qty 2

## 2013-08-10 MED ORDER — KETOROLAC TROMETHAMINE 30 MG/ML IJ SOLN: 30.0000 mg | Freq: Once | INTRAMUSCULAR | Status: DC

## 2013-08-10 MED ORDER — DIPHENHYDRAMINE HCL 50 MG/ML IJ SOLN: 50.0000 mg | Freq: Once | INTRAMUSCULAR | Status: DC

## 2013-08-10 NOTE — Progress Notes (Signed)
Clinical Social Work Department BRIEF PSYCHOSOCIAL ASSESSMENT 08/10/2013  Patient:  Kaitlin Hamilton,Kaitlin Hamilton     Account Number:  [id number]     Admit date:  08/08/2013  Clinical Social Worker:  ,, LCSW  Date/Time:  08/10/2013 03:30 PM  Referred by:  Physician  Date Referred:  08/10/2013 Referred for  SNF Placement   Other Referral:   Interview type:  Patient Other interview type:    PSYCHOSOCIAL DATA Living Status:  ALONE Admitted from facility:   Level of care:   Primary support name:  Kaitlin Hamilton Primary support relationship to patient:  CHILD, ADULT Degree of support available:   Strong    CURRENT CONCERNS Current Concerns  Post-Acute Placement   Other Concerns:    SOCIAL WORK ASSESSMENT / PLAN CSW received referral in order to assist with DC planning. CSW reviewed chart and spoke with aide at bedside. Aide reports that patient has been ambulating independently in hallway and in room. CSW met with patient at bedside. CSW introduced myself and explained role.    Patient reports that she lives home alone and dtrs live out of town. On previous hospital admission, patient was told to have 24 hour supervision for 2 weeks so friends and family stayed with her. Patient reports she had signed up for HH but was unable to pay deductibles so she decided to cancel services. Patient reports that she wanted to inquire about SNF placement. CSW explained that insurance requires for SNF to be approved and patient would need a skilled need. CSW explained if patient wanted to be placed and insurance did not approve then CSW could assist but it would be private pay. Patient reports that she feels comfortable returning home and already has lots of equipment at home.    CSW is signing off but available if further needs arise.   Assessment/plan status:  Psychosocial Support/Ongoing Assessment of Needs Other assessment/ plan:   Information/referral to community resources:   SNF information     PATIENT'S/FAMILY'S RESPONSE TO PLAN OF CARE: Patient alert and oriented. Patient reports she wants to know options but is upset about insurance. Patient reports that family and friends have been supportive and understanding of her needs. Patient reports that she is constantly worried about medical needs and is hopeful she will start feeling better soon. Patient reports no CSW needs at this time.        , LCSW [phone] 

## 2013-08-10 NOTE — Consult Note (Signed)
Name: Kaitlin Hamilton MRN: [id number] DOB: [birth year]    ADMISSION DATE:  08/08/2013 CONSULTATION DATE:  1/21  REFERRING MD :  Dhungel  PRIMARY SERVICE:  [facility]  CHIEF COMPLAINT:  Steroid responsive UIP   BRIEF PATIENT DESCRIPTION:  63 year old female f/b December Kaitlin Hamilton w/ bx proven steroid responsive UIP (dx'd December 2014 and started on cellcept and prednisone). Recently d/c on 1/14 for acute steroid induced delirium. Was sent home w/ instructions to hold cellcept, resume low dose prednisone. Was admitted to [facility] on 1/19 for evaluation of abd pain. PCCM was asked to see re: her UIP   SIGNIFICANT EVENTS / STUDIES:  Celiac profile>>>  LINES / TUBES:   CULTURES: 1/19: Cdiff: neg 1/19 stool O&P>>> 1/19 stool lactoferrin: neg   ANTIBIOTICS: Mepron 1/21>>>  HISTORY OF PRESENT ILLNESS:   63 y.o. Female, f/b [facility] for interstitial pneumonitis (UIP)with recent lung biopsy under the care of Dr. [name].  Recently diagnosed with H. pylori infection. Recently discharged on 1/14 for acute delirium felt to be due to higher dose steroids.  Was seen in routine f/u on 1/19 at GI office w/ CC:  abdominal pain she was subsequently referred to the ER. Patient has been having epigastric abdominal pain radiating to her lower abdominal quadrants ongoing since her lung biopsy surgery 10 days prior to admit. She is also complaining of some nausea without any emesis, reports multiple formed bowel movements a day, her C diff was negative. She was admitted for further evaluation of GI pain. Since her discharge on 1/16 her Cellcept has been held and she has been maintained only on 10mg /daily prednisone, and mepron. Eval of abd pain has included: stool studies to r/o infectious/ inflammatory cause. Add antispasmodics,  And sprue profile. We have been asked to see re: recommendations re: her pneumonitis while she undergoes her eval of her abd pain.   PAST MEDICAL HISTORY :  Past Medical History  Diagnosis Date    . Bronchitis   . Chicken pox   . Depression   . Allergic rhinitis     uses Flonase daily  . Hyperlipidemia     lost 43 pounds and no meds required at present  . Hypokalemia   . Anxiety   . PONV (postoperative nausea and vomiting)   . Hypertension     takes Verapamil and HCTZ daily  . Shortness of breath     with exertion;takes Singulair daily as well as Flonase  . Pneumonia     last time in 2006  . History of bronchitis     2013  . H/O hiatal hernia   . Migraines     last one about a month ago  . Dysphagia   . Joint swelling     left thumb  . OA (osteoarthritis)     left knee  . GERD (gastroesophageal reflux disease)     takes Protonix daily  . Hemorrhoids   . History of colon polyps   . Diverticulosis   . Urinary frequency   . History of kidney stones   . Non-insulin dependent type 2 diabetes mellitus     takes Glipizide daily  . Constipation     takes Fiber daily  . Hypothyroidism (acquired)     takes Synthroid daily  . Insomnia     doesn't take any meds for this   Past Surgical History  Procedure Laterality Date  . Tubal ligation    . Cholecystectomy    . Appendectomy    .  Abdominal exploration surgery      For Ovarian Cyst   . Left knee arthroscopy    . Esophagogastroduodenoscopy    . Tcs    . Lithotripsy      x 2  . Video assisted thoracoscopy Right 06/29/2013    Procedure: VIDEO ASSISTED THORACOSCOPY;  Surgeon: Melrose Nakayama, MD;  Location: [place];  Service: Thoracic;  Laterality: Right;  . Lung biopsy Right 06/29/2013    Procedure: LUNG BIOPSY;  Surgeon: Melrose Nakayama, MD;  Location: [place];  Service: Thoracic;  Laterality: Right;   Prior to Admission medications   Medication Sig Start Date End Date Taking? Authorizing Provider  acetaminophen (TYLENOL) 500 MG tablet Take 1,000 mg by mouth every 6 (six) hours as needed for moderate pain.   Yes Historical Provider, MD  Eszopiclone (ESZOPICLONE) 3 MG TABS Take 1 tablet (3 mg total) by  mouth at bedtime. Take immediately before bedtime 08/05/13  Yes Juanito Doom, MD  FIBER PO Take 1 tablet by mouth daily.   Yes Historical Provider, MD  fluticasone (FLONASE) 50 MCG/ACT nasal spray Place 2 sprays into the nose daily.    Yes Historical Provider, MD  glipiZIDE (GLUCOTROL) 5 MG tablet Take 5 mg by mouth daily.   Yes Historical Provider, MD  Homeopathic Products (CHESTAL HONEY COUGH) SYRP Take 5 mLs by mouth every other day as needed. 08/03/13  Yes Nishant Dhungel, MD  hydrochlorothiazide (MICROZIDE) 12.5 MG capsule Take 12.5 mg by mouth daily.   Yes Historical Provider, MD  hydrocortisone cream 0.5 % Apply 1 application topically 2 (two) times daily as needed (for rash).   Yes Historical Provider, MD  levothyroxine (SYNTHROID, LEVOTHROID) 125 MCG tablet Take 125 mcg by mouth daily before breakfast.   Yes Historical Provider, MD  montelukast (SINGULAIR) 10 MG tablet Take 10 mg by mouth at bedtime.   Yes Historical Provider, MD  pantoprazole (PROTONIX) 40 MG tablet Take 1 tablet (40 mg total) by mouth 2 (two) times daily. 08/03/13  Yes Nishant Dhungel, MD  predniSONE (DELTASONE) 10 MG tablet Take 10 mg by mouth daily with breakfast. Start taking from 08/05/2013 08/03/13  Yes Nishant Dhungel, MD  Probiotic Product (PROBIOTIC DAILY PO) Take 2 capsules by mouth 2 (two) times daily.   Yes Historical Provider, MD  traMADol-acetaminophen (ULTRACET) 37.5-325 MG per tablet Take 1 tablet by mouth every 6 (six) hours as needed. 08/03/13  Yes Nishant Dhungel, MD  verapamil (VERELAN PM) 180 MG 24 hr capsule Take 180 mg by mouth daily.   Yes Historical Provider, MD  atovaquone (MEPRON) 750 MG/5ML suspension Take 10 mLs (1,500 mg total) by mouth daily with breakfast. First dose on 1/16 08/03/13   Nishant Dhungel, MD  mycophenolate (CELLCEPT) 500 MG tablet Take 1 tablet (500 mg total) by mouth 2 (two) times daily. 08/03/13   Nishant Dhungel, MD   Allergies  Allergen Reactions  . Codeine Hives and  Swelling  . Demerol [Meperidine] Hives and Swelling  . Hydrocodone Nausea Only  . Sulfa Antibiotics Rash    FAMILY HISTORY:  Family History  Problem Relation Age of Onset  . Rheum arthritis Mother   . Rheum arthritis Sister   . Prostate cancer Brother   . Heart disease Maternal Grandmother   . Heart disease Maternal Grandfather   . Uterine cancer Daughter   . Colon cancer Neg Hx    SOCIAL HISTORY:  reports that she has never smoked. She has never used smokeless tobacco. She reports that  she does not drink alcohol or use illicit drugs.  REVIEW OF SYSTEMS:   Constitutional: Negative for fever, chills, weight loss, malaise/fatigue and diaphoresis.  HENT: Negative for hearing loss, ear pain, nosebleeds, congestion, sore throat, neck pain, tinnitus and ear discharge.  Respiratory: Negative for cough, hemoptysis, sputum production, shortness of breath at baseline but will have intermittent chest tightness after what sounds like reflux,  wheezing and stridor.   Cardiovascular: Negative for chest pain, palpitations, orthopnea, claudication, leg swelling and PND.  Gastrointestinal: Negative for heartburn, nausea, vomiting, abdominal pain, diarrhea, constipation, blood in stool and melena.  Genitourinary: Negative for dysuria, urgency, frequency, hematuria and flank pain.  Musculoskeletal: Negative for myalgias, back pain, joint pain and falls.  Skin: Negative for itching and rash.  Neurological: Negative for dizziness, tingling, tremors, sensory change, speech change, focal weakness, seizures, loss of consciousness, weakness and headaches.  Endo/Heme/Allergies: Negative for environmental allergies and polydipsia. Does not bruise/bleed easily.  SUBJECTIVE:  Feels a little better. Breathing unremarkable to her  VITAL SIGNS: Temp:  [97.5 F (36.4 C)-98.2 F (36.8 C)] 97.7 F (36.5 C) (01/21 0500) Pulse Rate:  [81-89] 89 (01/21 0500) Resp:  [18-20] 20 (01/21 0500) BP: (133-146)/(64-93)  146/77 mmHg (01/21 0500) SpO2:  [94 %-97 %] 97 % (01/20 2100) Weight:  [107.502 kg (237 lb)-108.41 kg (239 lb)] 107.502 kg (237 lb) (01/21 0500)  PHYSICAL EXAMINATION: General:  63 year old female, in no acute distress Neuro:  Awake, alert, no focal def  HEENT:  [place], no JVD  Cardiovascular:  rrr Lungs:  Posterior rales  Abdomen:  Non-tender  Musculoskeletal:  Intact  Skin: intact    Recent Labs Lab 08/08/13 1350 08/09/13 0945  NA 135* 141  K 4.5 4.1  CL 98 101  CO2 23 27  BUN 9 9  CREATININE 0.57 0.65  GLUCOSE 192* 132*    Recent Labs Lab 08/08/13 1350 08/09/13 0945  HGB 15.0 14.8  HCT 44.6 44.6  WBC 9.8 7.2  PLT 249 213   Dg Chest 2 View  08/08/2013   CLINICAL DATA:  Left chest pain.  Diabetes and hypertension.  EXAM: CHEST  2 VIEW  COMPARISON:  08/01/2013  FINDINGS: Heart size remains stable. Pulmonary interstitial prominence is stable. No evidence of acute infiltrate or edema. No evidence of pleural effusion. Mediastinal contours are stable.  IMPRESSION: Stable exam.  No active cardiopulmonary disease.   Electronically Signed   By: Earle Gell M.D.   On: 08/08/2013 16:14   Ct Abdomen Pelvis W Contrast  08/08/2013   CLINICAL DATA:  Intermittent upper abdominal pain.  EXAM: CT ABDOMEN AND PELVIS WITH CONTRAST  TECHNIQUE: Multidetector CT imaging of the abdomen and pelvis was performed using the standard protocol following bolus administration of intravenous contrast.  CONTRAST:  149mL OMNIPAQUE IOHEXOL 300 MG/ML  SOLN  COMPARISON:  DG ABDOMEN 1V dated 08/01/2013  FINDINGS: Linear densities in the lung bases, likely atelectasis. Heart is borderline in size. No pleural effusions.  Prior cholecystectomy. Liver, spleen, pancreas, adrenals are unremarkable.  Bilateral punctate nonobstructing renal stones. No ureteral stones or hydronephrosis. Urinary bladder is unremarkable. 5 cm left midpole cyst.  Transverse, descending colonic and sigmoid diverticulosis. No changes of active  diverticulitis. Small bowel is decompressed. No free fluid, free air or adenopathy. Uterus and adnexa are unremarkable. Aorta is normal caliber.  No acute bony abnormality.  IMPRESSION: Colonic diverticulosis.  No active diverticulitis.  No acute findings in the abdomen or pelvis.  Prior cholecystectomy.  Bibasilar atelectasis.  Electronically Signed   By: Rolm Baptise M.D.   On: 08/08/2013 18:24    ASSESSMENT / PLAN:  Abd pain. Etiology unclear. Doubt cellcept as hasn't been on it since her last d/c. She has h/o abd dysmotility and was  treated for H Pylori, also noted to have Schatzki's ring in Nov. 2014. She states that her pain has really been since her lung bx 12/10, since this time the only thing that she has been on constantly is prednisone.  Rec: Cont work-up currently under way by GI Stop prednisone, as may be causing gastritis, will restart end of February  ? Utility of repeat EGD?   Steroid responsive UIP: currently on low dose pred and Mepron and feels pretty good from pulm standpoint. She flares off pred. Currently no rush to re-start the cellcept from our stand-point. Would certainly need to r/o infective etiology first before even considering.  Rec: Repeat CXR in am  Hold prednisone until end of February Hold Cellcept until 1/27, then start 500mg  daily Hold atovaquone until end of February (when prednisone added back)  Insomnia: not responding to [place], likely pain and prednisone have contributed  Rec: Stop [employer] trazodone qHS  Attending:  I have seen and examined the patient with nurse practitioner/resident and agree with the note above.   Jillyn Hidden PCCM Pager: [phone] Cell: [phone] If no response, call [phone]

## 2013-08-10 NOTE — Progress Notes (Signed)
[facility] PROGRESS NOTE  Kaitlin Hamilton CVE:[id number] DOB: 09-22-1950 DOA: 08/08/2013 PCP: [name], MD  Assessment/Plan: Epigastric pain  [facility] following. Patient on low dose prednisone for her pneumonitis. Her symptoms could be related to CellCept as it can cause abdominal pain in upto >60% cases. I will consult pulmonary as well. We may need to stop cellcept for now.  -stool studies sent.  - On full liquid diet Intertitial pneumonitis.  - CellCept was discontinued given her GI issues during recent hospitalization.  - Per report, Pulmonary consulted. Continue low-dose prednisone.  3. Diabetes mellitus type 2.  Will cont on sliding scale insulin  ? Diarrhea  Check stool studies  Hypertension.   Code Status: Full Family Communication: Pt in room (indicate person spoken with, relationship, and if by phone, the number) Disposition Plan: Pending   Consultants:  GI  Pulmonary  Antibiotics:   (indicate start date, and stop date if known)  HPI/Subjective: No acute events noted overnight  Objective: Filed Vitals:   08/09/13 1445 08/09/13 1446 08/09/13 2100 08/10/13 0500  BP: 135/72  144/93 146/77  Pulse: 87  87 89  Temp: 97.5 F (36.4 C)  98.2 F (36.8 C) 97.7 F (36.5 C)  TempSrc: Oral  Oral Oral  Resp: 20  20 20   Height:  5\' 6"  (1.676 m)    Weight:  108.41 kg (239 lb)  107.502 kg (237 lb)  SpO2: 96%  97%     Intake/Output Summary (Last 24 hours) at 08/10/13 0927 Last data filed at 08/10/13 0504  Gross per 24 hour  Intake      0 ml  Output   1000 ml  Net  -1000 ml   Filed Weights   08/09/13 1446 08/10/13 0500  Weight: 108.41 kg (239 lb) 107.502 kg (237 lb)    Exam:   General:  Awake, in nad  Cardiovascular: regular, s1, s2  Respiratory: normal resp effort, no wheezing  Abdomen: soft, nondistended  Musculoskeletal: perfused, no clubbing   Data Reviewed: Basic Metabolic Panel:  Recent Labs Lab 08/08/13 1350 08/09/13 0945  NA  135* 141  K 4.5 4.1  CL 98 101  CO2 23 27  GLUCOSE 192* 132*  BUN 9 9  CREATININE 0.57 0.65  CALCIUM 9.4 9.6   Liver Function Tests:  Recent Labs Lab 08/08/13 1350  AST 15  ALT 22  ALKPHOS 69  BILITOT 0.5  PROT 7.3  ALBUMIN 3.8    Recent Labs Lab 08/09/13 0945  LIPASE 15   No results found for this basename: AMMONIA,  in the last 168 hours CBC:  Recent Labs Lab 08/08/13 1350 08/09/13 0945  WBC 9.8 7.2  NEUTROABS 8.0*  --   HGB 15.0 14.8  HCT 44.6 44.6  MCV 94.9 94.9  PLT 249 213   Cardiac Enzymes: No results found for this basename: CKTOTAL, CKMB, CKMBINDEX, TROPONINI,  in the last 168 hours BNP (last 3 results) No results found for this basename: PROBNP,  in the last 8760 hours CBG:  Recent Labs Lab 08/09/13 0954 08/09/13 1143 08/09/13 1639 08/09/13 2107 08/10/13 0731  GLUCAP 136* 147* 223* 172* 176*    Recent Results (from the past 240 hour(s))  URINE CULTURE     Status: None   Collection Time    08/01/13  5:04 PM      Result Value Range Status   Specimen Description URINE, CATHETERIZED   Final   Special Requests Immunocompromised   Final   Culture  Setup Time     Final   Value: 08/01/2013 21:10     Performed at [facility]     Final   Value: NO GROWTH     Performed at [facility]   Culture     Final   Value: NO GROWTH     Performed at [facility]   Report Status 08/02/2013 FINAL   Final  CLOSTRIDIUM DIFFICILE BY PCR     Status: None   Collection Time    08/09/13 11:26 AM      Result Value Range Status   C difficile by pcr NEGATIVE  NEGATIVE Final   Comment: Performed at [facility]     Status: None   Collection Time    08/09/13  4:45 PM      Result Value Range Status   Specimen Description STOOL   Final   Special Requests NONE   Final   Culture     Final   Value: Culture reincubated for better growth     Performed at [facility]   Report Status PENDING    Incomplete     Studies: Dg Chest 2 View  08/08/2013   CLINICAL DATA:  Left chest pain.  Diabetes and hypertension.  EXAM: CHEST  2 VIEW  COMPARISON:  08/01/2013  FINDINGS: Heart size remains stable. Pulmonary interstitial prominence is stable. No evidence of acute infiltrate or edema. No evidence of pleural effusion. Mediastinal contours are stable.  IMPRESSION: Stable exam.  No active cardiopulmonary disease.   Electronically Signed   By: Earle Gell M.D.   On: 08/08/2013 16:14   Ct Abdomen Pelvis W Contrast  08/08/2013   CLINICAL DATA:  Intermittent upper abdominal pain.  EXAM: CT ABDOMEN AND PELVIS WITH CONTRAST  TECHNIQUE: Multidetector CT imaging of the abdomen and pelvis was performed using the standard protocol following bolus administration of intravenous contrast.  CONTRAST:  169mL OMNIPAQUE IOHEXOL 300 MG/ML  SOLN  COMPARISON:  DG ABDOMEN 1V dated 08/01/2013  FINDINGS: Linear densities in the lung bases, likely atelectasis. Heart is borderline in size. No pleural effusions.  Prior cholecystectomy. Liver, spleen, pancreas, adrenals are unremarkable.  Bilateral punctate nonobstructing renal stones. No ureteral stones or hydronephrosis. Urinary bladder is unremarkable. 5 cm left midpole cyst.  Transverse, descending colonic and sigmoid diverticulosis. No changes of active diverticulitis. Small bowel is decompressed. No free fluid, free air or adenopathy. Uterus and adnexa are unremarkable. Aorta is normal caliber.  No acute bony abnormality.  IMPRESSION: Colonic diverticulosis.  No active diverticulitis.  No acute findings in the abdomen or pelvis.  Prior cholecystectomy.  Bibasilar atelectasis.   Electronically Signed   By: Rolm Baptise M.D.   On: 08/08/2013 18:24    Scheduled Meds: . atovaquone  1,500 mg Oral Q breakfast  . diphenhydrAMINE  50 mg Intravenous Once  . heparin  5,000 Units Subcutaneous Q8H  . insulin aspart  0-15 Units Subcutaneous TID WC  . levothyroxine  125 mcg Oral QAC  breakfast  . metoCLOPramide (REGLAN) injection  10 mg Intravenous Once  . pantoprazole (PROTONIX) IV  40 mg Intravenous Q12H  . predniSONE  10 mg Oral Q breakfast  . verapamil  180 mg Oral Daily  . zolpidem  5 mg Oral QHS   Continuous Infusions: . sodium chloride 75 mL/hr at 08/09/13 [id number]    Principal Problem:   Abdominal pain, epigastric Active Problems:   UIP (usual interstitial pneumonitis)  Esophageal dysmotility   Type II or unspecified type diabetes mellitus without mention of complication, not stated as uncontrolled   Abdominal pain  Time spent: 105min  Aundre Hietala, [facility] Pager [phone]. If 7PM-7AM, please contact night-coverage at [email or web address], password [facility] 08/10/2013, 9:27 AM  LOS: 2 days

## 2013-08-10 NOTE — Progress Notes (Signed)
Instructed patient to be NPO after midnight for small bowel study in the am

## 2013-08-10 NOTE — Progress Notes (Signed)
    Progress Note   Subjective  terrible night. Began having terrible abdominal pain around 1:15am. Finally felt better after IM med. She has had two BMs since pain started. Tolerating fulls.   Objective   Vital signs in last 24 hours: Temp:  [97.5 F (36.4 C)-98.2 F (36.8 C)] 97.7 F (36.5 C) (01/21 0500) Pulse Rate:  [74-89] 89 (01/21 0500) Resp:  [18-20] 20 (01/21 0500) BP: (124-146)/(64-93) 146/77 mmHg (01/21 0500) SpO2:  [94 %-97 %] 97 % (01/20 2100) Weight:  [237 lb (107.502 kg)-239 lb (108.41 kg)] 237 lb (107.502 kg) (01/21 0500) Last BM Date: 08/09/13 General:    white female in NAD Heart:  Regular rate and rhythm Abdomen:  Soft, obese, nondistended. She exhibits voluntary guarding of abdomen during palpation. Moderate epigastric and mid abdominal tenderness. Normal bowel sounds. .Neurologic:  Alert and oriented,  grossly normal neurologically. Psych:  Cooperative. Normal mood and affect.    Lab Results:  Recent Labs  08/08/13 1350 08/09/13 0945  WBC 9.8 7.2  HGB 15.0 14.8  HCT 44.6 44.6  PLT 249 213   BMET  Recent Labs  08/08/13 1350 08/09/13 0945  NA 135* 141  K 4.5 4.1  CL 98 101  CO2 23 27  GLUCOSE 192* 132*  BUN 9 9  CREATININE 0.57 0.65  CALCIUM 9.4 9.6   LFT  Recent Labs  08/08/13 1350  PROT 7.3  ALBUMIN 3.8  AST 15  ALT 22  ALKPHOS 69  BILITOT 0.5     Assessment / Plan:   69. 63 year old female with three week history of diffuse abdominal pain associated with increased frequency of non-diarrheal stools. Symptoms possibly medication related. Stool studies and celiac labs in progress. So far, c-diff negative, lactoferrin negative. O+P and culture pending. Pain not related to eating though she mentions being scared to eat because of the pain. From what I can discern, eating leads to defecation and abdominal pain seems to be someone associated with defecation. Doubt symptoms related to cellcept since she hasn't really taken that much of it  and hasn't had it for a week now (see #2). For further evaluation patient will be scheduled for small bowel series  2. Usual interstitial pneumonia per recent lung biopsy. She is being treated by pulmonary but wants her on cellcept. From GI standpoint she can restart cellcept.     LOS: 2 days   Tye Savoy  08/10/2013, 9:24 AM Attending MD note:   I have taken a history, examined the patient, and reviewed the chart. I agree with the Advanced Practitioner's impression and recommendations.   [facility] Pager # [phone] Severe pain last night, woke her up at 2.00 am. Will proceed with SBFT to assess for partial SBO, if negative will proceed with EGD on 08/12/2013

## 2013-08-11 ENCOUNTER — Ambulatory Visit: Payer: BC Managed Care – PPO | Admitting: Pulmonary Disease

## 2013-08-11 ENCOUNTER — Encounter (HOSPITAL_COMMUNITY): Payer: Self-pay

## 2013-08-11 ENCOUNTER — Observation Stay (HOSPITAL_COMMUNITY): Payer: BC Managed Care – PPO

## 2013-08-11 DIAGNOSIS — G4700 Insomnia, unspecified: Secondary | ICD-10-CM

## 2013-08-11 DIAGNOSIS — K219 Gastro-esophageal reflux disease without esophagitis: Secondary | ICD-10-CM

## 2013-08-11 LAB — GLUCOSE, CAPILLARY
GLUCOSE-CAPILLARY: 137 mg/dL — AB (ref 70–99)
GLUCOSE-CAPILLARY: 200 mg/dL — AB (ref 70–99)
Glucose-Capillary: 123 mg/dL — ABNORMAL HIGH (ref 70–99)

## 2013-08-11 MED ORDER — FLUTICASONE PROPIONATE 50 MCG/ACT NA SUSP
2.0000 | Freq: Every day | NASAL | Status: DC
  Administered 2013-08-11 – 2013-08-13 (×3): 2 via NASAL
  Filled 2013-08-11: qty 16

## 2013-08-11 MED ORDER — SALINE SPRAY 0.65 % NA SOLN
1.0000 | Freq: Four times a day (QID) | NASAL | Status: DC
  Administered 2013-08-11 – 2013-08-13 (×4): 1 via NASAL
  Filled 2013-08-11: qty 44

## 2013-08-11 NOTE — Progress Notes (Signed)
Name: Kaitlin Hamilton MRN: [id number] DOB: 1950/10/08    ADMISSION DATE:  08/08/2013 CONSULTATION DATE:  1/21  REFERRING MD :  Dhungel  PRIMARY SERVICE:  [facility]  CHIEF COMPLAINT:  Steroid responsive UIP   BRIEF PATIENT DESCRIPTION:  63 year old female f/b [place] w/ bx proven steroid responsive UIP (dx'd December 2014 and started on cellcept and prednisone). Recently d/c on 1/14 for acute steroid induced delirium. Was sent home w/ instructions to hold cellcept, resume low dose prednisone. Was admitted to [facility] on 1/19 for evaluation of abd pain. PCCM was asked to see re: her UIP   SIGNIFICANT EVENTS / STUDIES:  Celiac profile>>>  LINES / TUBES:   CULTURES: 1/19: Cdiff: neg 1/19 stool O&P>>> 1/19 stool lactoferrin: neg   ANTIBIOTICS: Mepron 1/21>>>1/21  SUBJECTIVE:  abd feels a little better, tolerating diet  Only complaint is right sinus congestion and ear pain  VITAL SIGNS: Temp:  [97.4 F (36.3 C)-97.8 F (36.6 C)] 97.8 F (36.6 C) (01/22 0607) Pulse Rate:  [70-79] 72 (01/22 0607) Resp:  [18-20] 18 (01/22 0607) BP: (124-135)/(72-79) 126/77 mmHg (01/22 0607) SpO2:  [96 %-97 %] 96 % (01/22 0607) Weight:  [108.364 kg (238 lb 14.4 oz)] 108.364 kg (238 lb 14.4 oz) (01/22 0607) Room air  PHYSICAL EXAMINATION: General:  63 year old female, in no acute distress Neuro:  Awake, alert, no focal def  HEENT:  [place], no JVD  Cardiovascular:  rrr Lungs:  Posterior rales Right>left  Abdomen:  Non-tender  Musculoskeletal:  Intact  Skin: intact    Recent Labs Lab 08/08/13 1350 08/09/13 0945  NA 135* 141  K 4.5 4.1  CL 98 101  CO2 23 27  BUN 9 9  CREATININE 0.57 0.65  GLUCOSE 192* 132*    Recent Labs Lab 08/08/13 1350 08/09/13 0945  HGB 15.0 14.8  HCT 44.6 44.6  WBC 9.8 7.2  PLT 249 213   Dg Chest 2 View  08/11/2013   CLINICAL DATA:  Left-sided chest pain, evaluate UIP  EXAM: CHEST  2 VIEW  COMPARISON:  08/08/2013; 07/12/2013; 06/28/2013; high-resolution chest CT -  04/22/2013  FINDINGS: Grossly unchanged cardiac silhouette and mediastinal contours given persistently reduced lung volumes. Stable postsurgical change the right upper lobe. Grossly unchanged slightly nodular opacities about the bilateral hilum are grossly unchanged. No new focal airspace opacities. No definite pleural effusion or pneumothorax. No evidence of edema. Grossly unchanged bones.  IMPRESSION: 1. Persistent findings of hypoventilation and perihilar atelectasis without acute cardiopulmonary disease. Specifically, no definitive evidence of progression of provided history of interstitial lung disease. 2. Stable postsurgical change of the right upper lobe.   Electronically Signed   By: Sandi Mariscal M.D.   On: 08/11/2013 11:05   Dg Abd 1 View  08/11/2013   CLINICAL DATA:  Abdominal pain. Small bowel follow-through was requested.  EXAM: ABDOMEN - 1 VIEW  COMPARISON:  CT 08/08/2013  FINDINGS: The bowel gas pattern is normal. Residual contrast is noted within the colon which precludes performance of the requested exam today. Numerous colonic diverticuli are noted. 3 mm left upper renal pole calculus reidentified. Dextrorotatory scoliosis of the lumbar spine is noted centered at L3. Right upper quadrant clips are reidentified.  IMPRESSION: Normal bowel gas pattern. Residual contrast in the colon precludes performance of small-bowel follow-through today.   Electronically Signed   By: Conchita Paris M.D.   On: 08/11/2013 10:25  no big changes in comparative CXR. RUL patchy changes possibly a little more  pronounced.   ASSESSMENT / PLAN:  Abd pain. Etiology unclear. Doubt cellcept as hasn't been on it since her last d/c. She has h/o abd dysmotility and was  treated for H Pylori, also noted to have Schatzki's ring in Nov. 2014. She states that her pain has really been since her lung bx 12/10, since this time the only thing that she has been on constantly is prednisone.  Rec: Cont work-up currently under way by  GI Stop prednisone, as may be causing gastritis, will restart end of February  Agree w/ EGD scheduled for 1/22  Steroid responsive UIP: currently on low dose pred and Mepron and feels pretty good from pulm standpoint. She flares off pred. Currently no rush to re-start the cellcept from our stand-point. Would certainly need to r/o infective etiology first before even considering.  Rec: Repeat CXR again 1/23 Hold prednisone until end of February Hold Cellcept until 1/27, then start 500mg  daily Hold atovaquone until end of February (when prednisone added back)  Ear pain/sinus headache  Plan: Add back nasal steroids and saline irrigation   Insomnia: not responding to ambien, likely pain and prednisone have contributed Rec: trazodone qHS    [place] PCCM Pager: [phone] Cell: [phone] If no response, call [phone]

## 2013-08-11 NOTE — Progress Notes (Signed)
Attending: I have seen and examined the patient with nurse practitioner/resident and agree with the note above.   Lungs remain clear Agree with EGD in AM  Start Cellcept next week, prednisone end of February  Chee Dimon, [place] PCCM Pager: [phone] Cell: [phone] If no response, call [phone]

## 2013-08-11 NOTE — Progress Notes (Signed)
     Progress Note   Subjective  feels like someone is poking my stomach but pain not as severe today   Objective   Vital signs in last 24 hours: Temp:  [97.4 F (36.3 C)-97.8 F (36.6 C)] 97.8 F (36.6 C) (01/22 0607) Pulse Rate:  [70-79] 72 (01/22 0607) Resp:  [18-20] 18 (01/22 0607) BP: (124-135)/(72-79) 126/77 mmHg (01/22 0607) SpO2:  [96 %-97 %] 96 % (01/22 0607) Weight:  [238 lb 14.4 oz (108.364 kg)] 238 lb 14.4 oz (108.364 kg) (01/22 0607) Last BM Date: 08/11/13 General:    white female in NAD, In bedside chair Heart:  Regular rate and rhythm Abdomen:  Soft, obese,moderate epigastric tenderness. Normal bowel sounds. Extremities:  Without edema. Neurologic:  Alert and oriented,  grossly normal neurologically. Psych:  Cooperative. Normal mood and affect. Lab Results:  Recent Labs  08/08/13 1350 08/09/13 0945  WBC 9.8 7.2  HGB 15.0 14.8  HCT 44.6 44.6  PLT 249 213   BMET  Recent Labs  08/08/13 1350 08/09/13 0945  NA 135* 141  K 4.5 4.1  CL 98 101  CO2 23 27  GLUCOSE 192* 132*  BUN 9 9  CREATININE 0.57 0.65  CALCIUM 9.4 9.6   LFT  Recent Labs  08/08/13 1350  PROT 7.3  ALBUMIN 3.8  AST 15  ALT 22  ALKPHOS 69  BILITOT 0.5     Assessment / Plan:   Diffuse abdominal pain associated with increased frequency of non-diarrheal stools.  Stool studies negative. Celiac labs normal. Doubt symptoms related to cellcept since she hasn't really taken that much of it and hasn't had it for a week now (see #2). Small bowel series not done as patient had residual barium from CTscan. Will cancel small bowel series and proceed with EGD in am   2. Usual interstitial pneumonia per recent lung biopsy. She is being treated by pulmonary but wants her on cellcept. From GI standpoint she can restart cellcept.   LOS: 3 days   Tye Savoy  08/11/2013, 9:53 AM Attending MD note:   I have taken a history, examined the patient, and reviewed the chart. I agree with the  Advanced Practitioner's impression and recommendations. She is still tender in the epigastrium, will proceed with EGD/enteroscopy tomorrow  [facility] Pager # [phone]

## 2013-08-11 NOTE — Progress Notes (Signed)
[facility] PROGRESS NOTE  Kaitlin Hamilton [id number] DOB: 07-Feb-1951 DOA: 08/08/2013 PCP: [name], MD  Assessment/Plan: Epigastric pain  [facility] following.  - Unclear etiology -stool studies sent.  - plan for EGD tomorrow Intertitial pneumonitis.  - CellCept was discontinued given her GI issues during recent hospitalization.  - Per report, Pulmonary consulted. Initially continued on low-dose prednisone with plans to hold for now given concerns of gastritis -  To resume steroids in Feb 3. Diabetes mellitus type 2.  Will cont on sliding scale insulin  ? Diarrhea  Check stool studies  Hypertension.   Code Status: Full Family Communication: Pt in room (indicate person spoken with, relationship, and if by phone, the number) Disposition Plan: Pending   Consultants:  GI  Pulmonary  Antibiotics:    HPI/Subjective: No acute events noted overnight  Objective: Filed Vitals:   08/10/13 0500 08/10/13 1401 08/10/13 2116 08/11/13 0607  BP: 146/77 135/79 124/72 126/77  Pulse: 89 79 70 72  Temp: 97.7 F (36.5 C) 97.4 F (36.3 C) 97.6 F (36.4 C) 97.8 F (36.6 C)  TempSrc: Oral Oral Oral Oral  Resp: 20 18 20 18   Height:      Weight: 107.502 kg (237 lb)   108.364 kg (238 lb 14.4 oz)  SpO2:  96% 97% 96%    Intake/Output Summary (Last 24 hours) at 08/11/13 1239 Last data filed at 08/11/13 0300  Gross per 24 hour  Intake    600 ml  Output   1700 ml  Net  -1100 ml   Filed Weights   08/09/13 1446 08/10/13 0500 08/11/13 0607  Weight: 108.41 kg (239 lb) 107.502 kg (237 lb) 108.364 kg (238 lb 14.4 oz)    Exam:   General:  Awake, in nad  Cardiovascular: regular, s1, s2  Respiratory: normal resp effort, no wheezing  Abdomen: soft, nondistended  Musculoskeletal: perfused, no clubbing   Data Reviewed: Basic Metabolic Panel:  Recent Labs Lab 08/08/13 1350 08/09/13 0945  NA 135* 141  K 4.5 4.1  CL 98 101  CO2 23 27  GLUCOSE 192* 132*  BUN 9 9   CREATININE 0.57 0.65  CALCIUM 9.4 9.6   Liver Function Tests:  Recent Labs Lab 08/08/13 1350  AST 15  ALT 22  ALKPHOS 69  BILITOT 0.5  PROT 7.3  ALBUMIN 3.8    Recent Labs Lab 08/09/13 0945  LIPASE 15   No results found for this basename: AMMONIA,  in the last 168 hours CBC:  Recent Labs Lab 08/08/13 1350 08/09/13 0945  WBC 9.8 7.2  NEUTROABS 8.0*  --   HGB 15.0 14.8  HCT 44.6 44.6  MCV 94.9 94.9  PLT 249 213   Cardiac Enzymes: No results found for this basename: CKTOTAL, CKMB, CKMBINDEX, TROPONINI,  in the last 168 hours BNP (last 3 results) No results found for this basename: PROBNP,  in the last 8760 hours CBG:  Recent Labs Lab 08/10/13 0731 08/10/13 1117 08/10/13 1646 08/11/13 0723 08/11/13 1118  GLUCAP 176* 137* 219* 137* 123*    Recent Results (from the past 240 hour(s))  URINE CULTURE     Status: None   Collection Time    08/01/13  5:04 PM      Result Value Range Status   Specimen Description URINE, CATHETERIZED   Final   Special Requests Immunocompromised   Final   Culture  Setup Time     Final   Value: 08/01/2013 21:10     Performed at  [facility] Count     Final   Value: NO GROWTH     Performed at [facility]     Final   Value: NO GROWTH     Performed at [facility]   Report Status 08/02/2013 FINAL   Final  CLOSTRIDIUM DIFFICILE BY PCR     Status: None   Collection Time    08/09/13 11:26 AM      Result Value Range Status   C difficile by pcr NEGATIVE  NEGATIVE Final   Comment: Performed at [facility]     Status: None   Collection Time    08/09/13  4:45 PM      Result Value Range Status   Specimen Description STOOL   Final   Special Requests NONE   Final   Culture     Final   Value: NO SUSPICIOUS COLONIES, CONTINUING TO HOLD     Performed at [facility]   Report Status PENDING   Incomplete  OVA AND PARASITE EXAMINATION     Status: None    Collection Time    08/09/13  4:45 PM      Result Value Range Status   Specimen Description STOOL   Final   Special Requests NONE   Final   Ova and parasites     Final   Value: NO OVA OR PARASITES SEEN     Performed at [facility]   Report Status 08/10/2013 FINAL   Final     Studies: Dg Chest 2 View  08/11/2013   CLINICAL DATA:  Left-sided chest pain, evaluate UIP  EXAM: CHEST  2 VIEW  COMPARISON:  08/08/2013; 07/12/2013; 06/28/2013; high-resolution chest CT - 04/22/2013  FINDINGS: Grossly unchanged cardiac silhouette and mediastinal contours given persistently reduced lung volumes. Stable postsurgical change the right upper lobe. Grossly unchanged slightly nodular opacities about the bilateral hilum are grossly unchanged. No new focal airspace opacities. No definite pleural effusion or pneumothorax. No evidence of edema. Grossly unchanged bones.  IMPRESSION: 1. Persistent findings of hypoventilation and perihilar atelectasis without acute cardiopulmonary disease. Specifically, no definitive evidence of progression of provided history of interstitial lung disease. 2. Stable postsurgical change of the right upper lobe.   Electronically Signed   By: Sandi Mariscal M.D.   On: 08/11/2013 11:05   Dg Abd 1 View  08/11/2013   CLINICAL DATA:  Abdominal pain. Small bowel follow-through was requested.  EXAM: ABDOMEN - 1 VIEW  COMPARISON:  CT 08/08/2013  FINDINGS: The bowel gas pattern is normal. Residual contrast is noted within the colon which precludes performance of the requested exam today. Numerous colonic diverticuli are noted. 3 mm left upper renal pole calculus reidentified. Dextrorotatory scoliosis of the lumbar spine is noted centered at L3. Right upper quadrant clips are reidentified.  IMPRESSION: Normal bowel gas pattern. Residual contrast in the colon precludes performance of small-bowel follow-through today.   Electronically Signed   By: Conchita Paris M.D.   On: 08/11/2013 10:25     Scheduled Meds: . diphenhydrAMINE  50 mg Intravenous Once  . fluticasone  2 spray Each Nare Daily  . heparin  5,000 Units Subcutaneous Q8H  . insulin aspart  0-15 Units Subcutaneous TID WC  . levothyroxine  125 mcg Oral QAC breakfast  . metoCLOPramide (REGLAN) injection  10 mg Intravenous Once  . pantoprazole (PROTONIX) IV  40 mg Intravenous Q12H  . sodium chloride  1 spray Each Nare QID  . traZODone  50 mg Oral QHS  . verapamil  180 mg Oral Daily   Continuous Infusions:    Principal Problem:   Abdominal pain, epigastric Active Problems:   UIP (usual interstitial pneumonitis)   Esophageal dysmotility   Type II or unspecified type diabetes mellitus without mention of complication, not stated as uncontrolled   Abdominal pain  Time spent: 9min  Mariene Dickerman, [facility] Pager [phone]. If 7PM-7AM, please contact night-coverage at [email or web address], password [facility] 08/11/2013, 12:39 PM  LOS: 3 days

## 2013-08-11 NOTE — Progress Notes (Signed)
Notified Roanna Raider NP that radiology was unable to perform the SBT due to contrast being present in the bowel since patient had a CT on Monday.

## 2013-08-12 ENCOUNTER — Telehealth: Payer: Self-pay

## 2013-08-12 ENCOUNTER — Encounter (HOSPITAL_COMMUNITY)
Admission: EM | Disposition: A | Discharge status: Home / Self Care | Payer: Self-pay | Source: Home / Self Care | Attending: Emergency Medicine

## 2013-08-12 ENCOUNTER — Encounter (HOSPITAL_COMMUNITY): Payer: Self-pay

## 2013-08-12 DIAGNOSIS — F29 Unspecified psychosis not due to a substance or known physiological condition: Secondary | ICD-10-CM

## 2013-08-12 DIAGNOSIS — IMO0001 Reserved for inherently not codable concepts without codable children: Secondary | ICD-10-CM

## 2013-08-12 DIAGNOSIS — R269 Unspecified abnormalities of gait and mobility: Secondary | ICD-10-CM

## 2013-08-12 DIAGNOSIS — E119 Type 2 diabetes mellitus without complications: Secondary | ICD-10-CM

## 2013-08-12 DIAGNOSIS — K224 Dyskinesia of esophagus: Secondary | ICD-10-CM

## 2013-08-12 HISTORY — PX: ENTEROSCOPY: SHX5533

## 2013-08-12 LAB — GLUCOSE, CAPILLARY
GLUCOSE-CAPILLARY: 182 mg/dL — AB (ref 70–99)
Glucose-Capillary: 145 mg/dL — ABNORMAL HIGH (ref 70–99)
Glucose-Capillary: 132 mg/dL — ABNORMAL HIGH (ref 70–99)
Glucose-Capillary: 162 mg/dL — ABNORMAL HIGH (ref 70–99)

## 2013-08-12 LAB — RETICULIN ANTIBODIES, IGA W TITER: Reticulin Ab, IgA: NEGATIVE

## 2013-08-12 SURGERY — ENTEROSCOPY
Anesthesia: Moderate Sedation

## 2013-08-12 MED ORDER — MIDAZOLAM HCL 10 MG/2ML IJ SOLN
INTRAMUSCULAR | Status: DC | PRN
  Administered 2013-08-12: 1 mg via INTRAVENOUS
  Administered 2013-08-12 (×2): 2 mg via INTRAVENOUS

## 2013-08-12 MED ORDER — FENTANYL CITRATE 0.05 MG/ML IJ SOLN
INTRAMUSCULAR | Status: AC
Start: 2013-08-12 — End: 2013-08-12
  Filled 2013-08-12: qty 4

## 2013-08-12 MED ORDER — MIDAZOLAM HCL 10 MG/2ML IJ SOLN
INTRAMUSCULAR | Status: AC
  Filled 2013-08-12: qty 4

## 2013-08-12 MED ORDER — SODIUM CHLORIDE 0.9 % IV SOLN
INTRAVENOUS | Status: DC
Start: 2013-08-12 — End: 2013-08-12

## 2013-08-12 MED ORDER — DIPHENHYDRAMINE HCL 50 MG/ML IJ SOLN
INTRAMUSCULAR | Status: AC
  Filled 2013-08-12: qty 1

## 2013-08-12 MED ORDER — DIPHENHYDRAMINE HCL 50 MG/ML IJ SOLN
INTRAMUSCULAR | Status: DC | PRN
  Administered 2013-08-12: 25 mg via INTRAVENOUS

## 2013-08-12 MED ORDER — FENTANYL CITRATE 0.05 MG/ML IJ SOLN
INTRAMUSCULAR | Status: DC | PRN
  Administered 2013-08-12: 25 ug via INTRAVENOUS

## 2013-08-12 NOTE — Telephone Encounter (Signed)
Pt's daughter's FMLA paperwork has been signed by BQ, put back on the dumbwaiter in the medical records department to be returned to [facility].

## 2013-08-12 NOTE — Op Note (Signed)
[facility] [place], [id number]   OPERATIVE PROCEDURE REPORT  PATIENT: Kaitlin Hamilton, Kaitlin Hamilton  MR#: [id number] BIRTHDATE: 1951/04/25 , 44  yrs. old GENDER: Female ENDOSCOPIST: Lafayette Dragon, MD REFERRED BY:  Dr Kalman Jewels , Dr J.Pyrtle PROCEDURE DATE: 08/12/2013 PROCEDURE:   Small bowel enteroscopy with biopsy ASA CLASS:   Class III INDICATIONS:1.  recent diagnosis of UIP, treated with Cellcept and steroids.  Developed nausea vomiting and abdominal pain.  CT scan nondiagnostic.  Upper endoscopy in November 2014 was positive for H.Pylori gastritis treated with Pylera.refractory to PPIs.Marland Kitchen MEDICATIONS: These medications were titrated to patient response per physician's verbal order, Fentanyl 25 mcg IV, Benadryl 25 mg IV, and Versed 2 mg IV TOPICAL ANESTHETIC:   Cetacaine Spray  DESCRIPTION OF PROCEDURE:   After the risks benefits and alternatives of the procedure were thoroughly explained, informed consent was obtained.  The     endoscope was introduced through the mouth  and advanced to the proximal jejunum jejunum , limited by Without limitations.   The instrument was slowly withdrawn as the mucosa was fully examined.  Esophagus: esophageal mucosa appeared normal in the proximal mid and distal esophagus. There was a fibrous nonobstructing ring at the GE junction which was noted prior endoscopy. Endoscope traversed without difficulty there was no esophagitis. M: Gastric mucosa was mildly erythematous especially in the gastric antrum. Biopsies were taken to follow up on H. pylori. The minimal erosions. Gastric outlet was unremarkable. Retroflexion of the endoscope revealed normal fundus and cardiaDuodenum: Duodenal bulb and descending duodenum was normal Jejunum: The endoscope traversed through the proximal jejunum to the level 140 cm from the incisors which was approximately distal to the ligament of Treitz. Mucosa appeared normal there were no evidence  of ischemic changes or erosions. Random biopsies were taken from jejunum       Retroflexed views revealed no abnormalities.    The scope was then withdrawn from the patient and the procedure terminated.  COMPLICATIONS: There were no complications. ENDOSCOPIC IMPRESSION: mild antral gastritis. Status post biopsies 2 followup on H. pylori Nonobstructing esophageal fibrous ring not dilated Normal enteroscopy 240 cm, distal to ligament of Treitz. Status post random biopsies  RECOMMENDATIONS:  Resume diet Continue antispasmodic Bentyl 10 mg twice a day Await results of the biopsies continue Lovenox bridge, conmtinue to hold Cellcept since it might have caused pt's symptoms REPEAT EXAM: no  _______________________________ eSignedLafayette Dragon, MD 08/12/2013 11:13 AM   CC:  PATIENT NAME:  Zamari, Bonsall MR#: [id number]

## 2013-08-12 NOTE — Progress Notes (Signed)
Inpatient Diabetes Program Recommendations  AACE/ADA: New Consensus Statement on Inpatient Glycemic Control (2013)  Target Ranges:  Prepandial:   less than 140 mg/dL      Peak postprandial:   less than 180 mg/dL (1-2 hours)      Critically ill patients:  140 - 180 mg/dL   Reason for Visit: Results for Kaitlin Hamilton, Kaitlin Hamilton (MRN [id number]) as of 08/12/2013 11:06  Ref. Range 08/11/2013 16:58 08/11/2013 22:20 08/12/2013 07:07  Glucose-Capillary Latest Range: 70-99 mg/dL 200 (H) 182 (H) 145 (H)  Results for [name], [name] (MRN [id number]) as of 08/12/2013 11:06  Ref. Range 08/09/2013 09:45  Hemoglobin A1C Latest Range: <5.7 % 8.5 (H)    Diabetes history: Type 2 diabetes Outpatient Diabetes medications: Glucotrol 5 mg daily Current orders for Inpatient glycemic control: Novolog moderate correction tid with meals  Note: A1C indicates poor glycemic control.  Note that patient was on Prednisone also which may have contributed to elevated CBG.  CBG looks okay this morning.  No recommendations.  Adah Perl, RN, BC-ADM Inpatient Diabetes Coordinator Pager [phone]

## 2013-08-12 NOTE — Progress Notes (Signed)
[facility] PROGRESS NOTE  Kaitlin Hamilton PFX:[id number] DOB: 02/21/1951 DOA: 08/08/2013 PCP: [name], MD  Assessment/Plan: Epigastric pain  [facility] following.  - Unclear etiology - s/p EGD today with findings of gastritis Intertitial pneumonitis.  - CellCept was discontinued given her GI issues during recent hospitalization.  - Per report, Pulmonary consulted. Initially continued on low-dose prednisone with plans to hold for now given concerns of gastritis - Pulmonary recs to resume steroids in Feb 3. Diabetes mellitus type 2.  Will cont on sliding scale insulin  ? Diarrhea  Check stool studies  Hypertension.   Code Status: Full Family Communication: Pt in room (indicate person spoken with, relationship, and if by phone, the number) Disposition Plan: Pending   Consultants:  GI  Pulmonary  HPI/Subjective: No acute events noted overnight  Objective: Filed Vitals:   08/11/13 1320 08/11/13 2200 08/12/13 0600 08/12/13 0632  BP: 135/80 132/79 131/71   Pulse: 74 72 83   Temp: 97.5 F (36.4 C) 97.7 F (36.5 C) 97.6 F (36.4 C)   TempSrc: Axillary Oral Oral   Resp: 20 18 18    Height:      Weight:    108.9 kg (240 lb 1.3 oz)  SpO2: 98% 96% 93%     Intake/Output Summary (Last 24 hours) at 08/12/13 0929 Last data filed at 08/11/13 2229  Gross per 24 hour  Intake      0 ml  Output    800 ml  Net   -800 ml   Filed Weights   08/10/13 0500 08/11/13 0607 08/12/13 3299  Weight: 107.502 kg (237 lb) 108.364 kg (238 lb 14.4 oz) 108.9 kg (240 lb 1.3 oz)    Exam:   General:  Awake, in nad  Cardiovascular: regular, s1, s2  Respiratory: normal resp effort, no wheezing  Abdomen: soft, nondistended  Musculoskeletal: perfused, no clubbing   Data Reviewed: Basic Metabolic Panel:  Recent Labs Lab 08/08/13 1350 08/09/13 0945  NA 135* 141  K 4.5 4.1  CL 98 101  CO2 23 27  GLUCOSE 192* 132*  BUN 9 9  CREATININE 0.57 0.65  CALCIUM 9.4 9.6   Liver  Function Tests:  Recent Labs Lab 08/08/13 1350  AST 15  ALT 22  ALKPHOS 69  BILITOT 0.5  PROT 7.3  ALBUMIN 3.8    Recent Labs Lab 08/09/13 0945  LIPASE 15   No results found for this basename: AMMONIA,  in the last 168 hours CBC:  Recent Labs Lab 08/08/13 1350 08/09/13 0945  WBC 9.8 7.2  NEUTROABS 8.0*  --   HGB 15.0 14.8  HCT 44.6 44.6  MCV 94.9 94.9  PLT 249 213   Cardiac Enzymes: No results found for this basename: CKTOTAL, CKMB, CKMBINDEX, TROPONINI,  in the last 168 hours BNP (last 3 results) No results found for this basename: PROBNP,  in the last 8760 hours CBG:  Recent Labs Lab 08/11/13 0723 08/11/13 1118 08/11/13 1658 08/11/13 2220 08/12/13 0707  GLUCAP 137* 123* 200* 182* 145*    Recent Results (from the past 240 hour(s))  CLOSTRIDIUM DIFFICILE BY PCR     Status: None   Collection Time    08/09/13 11:26 AM      Result Value Range Status   C difficile by pcr NEGATIVE  NEGATIVE Final   Comment: Performed at [place] [facility]     Status: None   Collection Time    08/09/13  4:45 PM  Result Value Range Status   Specimen Description STOOL   Final   Special Requests NONE   Final   Culture     Final   Value: NO SUSPICIOUS COLONIES, CONTINUING TO HOLD     Performed at [facility]   Report Status PENDING   Incomplete  OVA AND PARASITE EXAMINATION     Status: None   Collection Time    08/09/13  4:45 PM      Result Value Range Status   Specimen Description STOOL   Final   Special Requests NONE   Final   Ova and parasites     Final   Value: NO OVA OR PARASITES SEEN     Performed at [facility]   Report Status 08/10/2013 FINAL   Final     Studies: Dg Chest 2 View  08/11/2013   CLINICAL DATA:  Left-sided chest pain, evaluate UIP  EXAM: CHEST  2 VIEW  COMPARISON:  08/08/2013; 07/12/2013; 06/28/2013; high-resolution chest CT - 04/22/2013  FINDINGS: Grossly unchanged cardiac silhouette and mediastinal  contours given persistently reduced lung volumes. Stable postsurgical change the right upper lobe. Grossly unchanged slightly nodular opacities about the bilateral hilum are grossly unchanged. No new focal airspace opacities. No definite pleural effusion or pneumothorax. No evidence of edema. Grossly unchanged bones.  IMPRESSION: 1. Persistent findings of hypoventilation and perihilar atelectasis without acute cardiopulmonary disease. Specifically, no definitive evidence of progression of provided history of interstitial lung disease. 2. Stable postsurgical change of the right upper lobe.   Electronically Signed   By: Sandi Mariscal M.D.   On: 08/11/2013 11:05   Dg Abd 1 View  08/11/2013   CLINICAL DATA:  Abdominal pain. Small bowel follow-through was requested.  EXAM: ABDOMEN - 1 VIEW  COMPARISON:  CT 08/08/2013  FINDINGS: The bowel gas pattern is normal. Residual contrast is noted within the colon which precludes performance of the requested exam today. Numerous colonic diverticuli are noted. 3 mm left upper renal pole calculus reidentified. Dextrorotatory scoliosis of the lumbar spine is noted centered at L3. Right upper quadrant clips are reidentified.  IMPRESSION: Normal bowel gas pattern. Residual contrast in the colon precludes performance of small-bowel follow-through today.   Electronically Signed   By: Conchita Paris M.D.   On: 08/11/2013 10:25    Scheduled Meds: . diphenhydrAMINE  50 mg Intravenous Once  . fluticasone  2 spray Each Nare Daily  . heparin  5,000 Units Subcutaneous Q8H  . insulin aspart  0-15 Units Subcutaneous TID WC  . levothyroxine  125 mcg Oral QAC breakfast  . metoCLOPramide (REGLAN) injection  10 mg Intravenous Once  . pantoprazole (PROTONIX) IV  40 mg Intravenous Q12H  . sodium chloride  1 spray Each Nare QID  . traZODone  50 mg Oral QHS  . verapamil  180 mg Oral Daily   Continuous Infusions:    Principal Problem:   Abdominal pain, epigastric Active Problems:    UIP (usual interstitial pneumonitis)   Esophageal dysmotility   Type II or unspecified type diabetes mellitus without mention of complication, not stated as uncontrolled   Abdominal pain  Time spent: 68min  [facility], [facility] Pager [phone]. If 7PM-7AM, please contact night-coverage at [email or web address], password [facility] 08/12/2013, 9:29 AM  LOS: 4 days

## 2013-08-12 NOTE — Progress Notes (Signed)
[facility] PCCM  S: Feels OK, EGD this morning with mild antral gastritis, sleeping well with trazodone, ear pain better O: Filed Vitals:   08/12/13 1100 08/12/13 1110 08/12/13 1120 08/12/13 1130  BP: 116/57 102/47 126/64 126/79  Pulse:      Temp:      TempSrc:      Resp: 23 19 16 22   Height:      Weight:      SpO2: 97% 97% 98% 98%     Gen: well appearing, no acute distress HEENT: NCAT,OP clear,  PULM: Few crackles in bases unchanged CV: RRR, no mgr, no JVD AB: BS+, soft,mild epigastric tenderness, no hsm Ext: warm, no edema, no clubbing, no cyanosis  Impression: 1) Gastritis > I think there is some confusion over her Cellcept dosing; her epigastric pain preceded the Cellcept which she started on 1/8 and only took it through 1/12; However, she had been on high dose steroids which caused confusion sleep depravation, and likely contributed to the gastritis  2) UIP > currently doing OK off immunosuppressants  3) Insomnia > better with trazodone  Plan: 1) Start Cellcept 500mg  daily on January 27 2) Hold prednisone (I will restart in late February after an office visit) 3) Hold atovaquone (I will restart in late February after an office visit) 4) Continue trazodone at night  PCCM available on an as needed basis over the weekend OK for D/C from our perspective (once GI issues resolved)  Jillyn Hidden PCCM Pager: [phone] Cell: [phone] If no response, call [phone]

## 2013-08-13 DIAGNOSIS — R197 Diarrhea, unspecified: Secondary | ICD-10-CM

## 2013-08-13 LAB — GLUCOSE, CAPILLARY
GLUCOSE-CAPILLARY: 146 mg/dL — AB (ref 70–99)
Glucose-Capillary: 147 mg/dL — ABNORMAL HIGH (ref 70–99)
Glucose-Capillary: 180 mg/dL — ABNORMAL HIGH (ref 70–99)
Glucose-Capillary: 167 mg/dL — ABNORMAL HIGH (ref 70–99)

## 2013-08-13 LAB — STOOL CULTURE

## 2013-08-13 MED ORDER — TRAZODONE HCL 50 MG PO TABS: 50.0000 mg | ORAL_TABLET | Freq: Every day | ORAL | Status: DC

## 2013-08-13 MED ORDER — DICYCLOMINE HCL 10 MG PO CAPS: 10.0000 mg | ORAL_CAPSULE | Freq: Four times a day (QID) | ORAL | Status: DC | PRN

## 2013-08-13 NOTE — Progress Notes (Signed)
Family at bedside; discussed dc instructions, questions answered, Rx given, signed; transported via [place]; Pt d/c'd in stable condition

## 2013-08-13 NOTE — Progress Notes (Signed)
[facility] PROGRESS NOTE  Kaitlin Hamilton HEN:[id number] DOB: [birth year] DOA: 08/08/2013 PCP: [name], MD  Assessment/Plan: Epigastric pain  [facility] following.  - s/p EGD on 1/23 with findings of gastritis - Possible side effect of CellCept Intertitial pneumonitis.  - CellCept was discontinued given her GI issues during recent hospitalization.  - Per report, Pulmonary consulted. Initially continued on low-dose prednisone with plans to hold for now given concerns of gastritis - Pulmonary recs to resume steroids in Feb 3. Diabetes mellitus type 2.  Will cont on sliding scale insulin  ? Diarrhea  Check stool studies  Hypertension.  - Stable  Code Status: Full Family Communication: Pt in room (indicate person spoken with, relationship, and if by phone, the number) Disposition Plan: Pending  Consultants:  GI  Pulmonary  HPI/Subjective: No acute events noted overnight. Feels a little better. Is requesting placement for SNF for rehab  Objective: Filed Vitals:   08/12/13 1445 08/12/13 2200 08/13/13 0600 08/13/13 0944  BP: 139/82 142/80 121/72 133/87  Pulse: 77 70 78   Temp: 97.5 F (36.4 C) 97.9 F (36.6 C) 97.5 F (36.4 C)   TempSrc: Oral Oral Oral   Resp: 20 18 18    Height:      Weight:      SpO2: 99% 95% 93%     Intake/Output Summary (Last 24 hours) at 08/13/13 1142 Last data filed at 08/13/13 0806  Gross per 24 hour  Intake    240 ml  Output      0 ml  Net    240 ml   Filed Weights   08/10/13 0500 08/11/13 0607 08/12/13 0632  Weight: 107.502 kg (237 lb) 108.364 kg (238 lb 14.4 oz) 108.9 kg (240 lb 1.3 oz)    Exam:   General:  Awake, in nad  Cardiovascular: regular, s1, s2  Respiratory: normal resp effort, no wheezing  Abdomen: soft, nondistended  Musculoskeletal: perfused, no clubbing   Data Reviewed: Basic Metabolic Panel:  Recent Labs Lab 08/08/13 1350 08/09/13 0945  NA 135* 141  K 4.5 4.1  CL 98 101  CO2 23 27  GLUCOSE 192*  132*  BUN 9 9  CREATININE 0.57 0.65  CALCIUM 9.4 9.6   Liver Function Tests:  Recent Labs Lab 08/08/13 1350  AST 15  ALT 22  ALKPHOS 69  BILITOT 0.5  PROT 7.3  ALBUMIN 3.8    Recent Labs Lab 08/09/13 0945  LIPASE 15   No results found for this basename: AMMONIA,  in the last 168 hours CBC:  Recent Labs Lab 08/08/13 1350 08/09/13 0945  WBC 9.8 7.2  NEUTROABS 8.0*  --   HGB 15.0 14.8  HCT 44.6 44.6  MCV 94.9 94.9  PLT 249 213   Cardiac Enzymes: No results found for this basename: CKTOTAL, CKMB, CKMBINDEX, TROPONINI,  in the last 168 hours BNP (last 3 results) No results found for this basename: PROBNP,  in the last 8760 hours CBG:  Recent Labs Lab 08/12/13 1144 08/12/13 1725 08/12/13 2115 08/13/13 0653 08/13/13 1123  GLUCAP 132* 162* 147* 180* 167*    Recent Results (from the past 240 hour(s))  CLOSTRIDIUM DIFFICILE BY PCR     Status: None   Collection Time    08/09/13 11:26 AM      Result Value Range Status   C difficile by pcr NEGATIVE  NEGATIVE Final   Comment: Performed at [place] [facility]     Status: None   Collection  Time    08/09/13  4:45 PM      Result Value Range Status   Specimen Description STOOL   Final   Special Requests NONE   Final   Culture     Final   Value: NO SUSPICIOUS COLONIES, CONTINUING TO HOLD     Performed at [facility]   Report Status PENDING   Incomplete  OVA AND PARASITE EXAMINATION     Status: None   Collection Time    08/09/13  4:45 PM      Result Value Range Status   Specimen Description STOOL   Final   Special Requests NONE   Final   Ova and parasites     Final   Value: NO OVA OR PARASITES SEEN     Performed at [facility]   Report Status 08/10/2013 FINAL   Final     Studies: No results found.  Scheduled Meds: . diphenhydrAMINE  50 mg Intravenous Once  . fluticasone  2 spray Each Nare Daily  . heparin  5,000 Units Subcutaneous Q8H  . insulin aspart  0-15 Units  Subcutaneous TID WC  . levothyroxine  125 mcg Oral QAC breakfast  . metoCLOPramide (REGLAN) injection  10 mg Intravenous Once  . pantoprazole (PROTONIX) IV  40 mg Intravenous Q12H  . sodium chloride  1 spray Each Nare QID  . traZODone  50 mg Oral QHS  . verapamil  180 mg Oral Daily   Continuous Infusions:    Principal Problem:   Abdominal pain, epigastric Active Problems:   UIP (usual interstitial pneumonitis)   Esophageal dysmotility   Type II or unspecified type diabetes mellitus without mention of complication, not stated as uncontrolled   Abdominal pain  Time spent: 62min  Nneka Blanda, [facility] Pager [phone]. If 7PM-7AM, please contact night-coverage at [email or web address], password [facility] 08/13/2013, 11:42 AM  LOS: 5 days

## 2013-08-13 NOTE — Progress Notes (Signed)
HISTORY OF PRESENT ILLNESS:  Kaitlin Hamilton is a 63 y.o. female admitted with nausea, vomiting, diarrhea and abdominal pain. Case reviewed with Dr. Olevia Perches. Workup thus far has been negative including CT scan and enteroscopy. Felt to have possible reaction to CellCept. No nausea or vomiting. Some postprandial cramping with urgency. Stools formed. Doing better.  REVIEW OF SYSTEMS:  All non-GI ROS negative except for exertional shortness of breath  Past Medical History  Diagnosis Date  . Bronchitis   . Chicken pox   . Depression   . Allergic rhinitis     uses Flonase daily  . Hyperlipidemia     lost 43 pounds and no meds required at present  . Hypokalemia   . Anxiety   . PONV (postoperative nausea and vomiting)   . Hypertension     takes Verapamil and HCTZ daily  . Shortness of breath     with exertion;takes Singulair daily as well as Flonase  . Pneumonia     last time in 2006  . History of bronchitis     2013  . H/O hiatal hernia   . Migraines     last one about a month ago  . Dysphagia   . Joint swelling     left thumb  . OA (osteoarthritis)     left knee  . GERD (gastroesophageal reflux disease)     takes Protonix daily  . Hemorrhoids   . History of colon polyps   . Diverticulosis   . Urinary frequency   . History of kidney stones   . Non-insulin dependent type 2 diabetes mellitus     takes Glipizide daily  . Constipation     takes Fiber daily  . Hypothyroidism (acquired)     takes Synthroid daily  . Insomnia     doesn't take any meds for this    Past Surgical History  Procedure Laterality Date  . Tubal ligation    . Cholecystectomy    . Appendectomy    . Abdominal exploration surgery      For Ovarian Cyst   . Left knee arthroscopy    . Esophagogastroduodenoscopy    . Tcs    . Lithotripsy      x 2  . Video assisted thoracoscopy Right 06/29/2013    Procedure: VIDEO ASSISTED THORACOSCOPY;  Surgeon: Melrose Nakayama, MD;  Location: [place];  Service:  Thoracic;  Laterality: Right;  . Lung biopsy Right 06/29/2013    Procedure: LUNG BIOPSY;  Surgeon: Melrose Nakayama, MD;  Location: [place];  Service: Thoracic;  Laterality: Right;    Social History Kaitlin Hamilton  reports that she has never smoked. She has never used smokeless tobacco. She reports that she does not drink alcohol or use illicit drugs.  family history includes Heart disease in her maternal grandfather and maternal grandmother; Prostate cancer in her brother; Rheum arthritis in her mother and sister; Uterine cancer in her daughter. There is no history of Colon cancer.  Allergies  Allergen Reactions  . Codeine Hives and Swelling  . Demerol [Meperidine] Hives and Swelling  . Hydrocodone Nausea Only  . Sulfa Antibiotics Rash       PHYSICAL EXAMINATION: Vital signs: BP 133/87  Pulse 78  Temp(Src) 97.5 F (36.4 C) (Oral)  Resp 18  Ht 5\' 6"  (1.676 m)  Wt 240 lb 1.3 oz (108.9 kg)  BMI 38.77 kg/m2  SpO2 93% General: Well-developed, well-nourished, no acute distress HEENT: Sclerae are anicteric, conjunctiva pink. Oral  mucosa intact Lungs: Clear Heart: Regular Abdomen: soft, obese, nontender, nondistended, no obvious ascites, no peritoneal signs, normal bowel sounds. No organomegaly. Extremities: No edema Psychiatric: alert and oriented x3. Cooperative     ASSESSMENT:  #1. Nausea, vomiting, diarrhea and abdominal pain. For the most part resolved. Possible drug reaction. Negative extensive workup.  PLAN:  #1. Advance diet as tolerated #2. Continue to use Bentyl as needed for abdominal cramping #3. GI followup as needed. Will sign off.  Docia Chuck. Geri Seminole., M.D. [facility] Division of Gastroenterology

## 2013-08-13 NOTE — Discharge Summary (Signed)
Physician Discharge Summary  Kaitlin Hamilton KGM:[id number] DOB: [birth year] DOA: 08/08/2013  PCP: [name], MD  Admit date: 08/08/2013 Discharge date: 08/13/2013  Time spent: 35 minutes  Recommendations for Outpatient Follow-up:  1. Follow up with Dr. [name] as scheduled 2. Follow up with PCP in 1-2 weeks  Discharge Diagnoses:  Principal Problem:   Abdominal pain, epigastric Active Problems:   UIP (usual interstitial pneumonitis)   Esophageal dysmotility   Type II or unspecified type diabetes mellitus without mention of complication, not stated as uncontrolled   Abdominal pain   Discharge Condition: Improved  Diet recommendation: Diabetic  Filed Weights   08/10/13 0500 08/11/13 0607 08/12/13 6440  Weight: 107.502 kg (237 lb) 108.364 kg (238 lb 14.4 oz) 108.9 kg (240 lb 1.3 oz)    History of present illness:  Kaitlin Hamilton is a 63 y.o. female, straight of interstitial pneumonitis with recent lung biopsy under the care of Dr. [name], diabetes mellitus type 2, GERD, recently diagnosed with H. pylori infection, chronic insomnia and headaches, who has had a recent EGD and colonoscopy done by Dr. Elmo Putt, comes in after seeing Dr. Elmo Putt in the office for abdominal pain she was subsequently referred to the ER. Patient has been having epigastric abdominal pain radiating to her lower abdominal quadrants ongoing since her lung biopsy surgery 10 days ago. She is also complaining of some nausea without any emesis, reports multiple formed bowel movements a day, she was checked for C. difficile PCR last Monday and it was negative, came to the ER with these complaints where her blood work along with CT scan of abdomen pelvis were unremarkable. Her was requested to admit the patient for further GI workup as suggested by Dr. Elmo Putt in the office.  Hospital Course:  Epigastric pain  [facility] following.  - s/p EGD on 1/23 with findings of gastritis  - Possible side effect of CellCept  - Pulmonary  recs to hold prednisone, atovaquone, and cellcept for now Intertitial pneumonitis.  - CellCept was discontinued given her GI issues during recent hospitalization.  - Per report, Pulmonary consulted. Initially continued on low-dose prednisone with plans to hold for now given concerns of gastritis  - Pulmonary recs to resume steroids in Feb  - Recs also to hold atovaquone 3. Diabetes mellitus type 2.  cont on sliding scale insulin  ? Diarrhea  Per above Hypertension.  - Stable  Consultations:  GI  Pulmonary  Discharge Exam: Filed Vitals:   08/12/13 2200 08/13/13 0600 08/13/13 0944 08/13/13 1357  BP: 142/80 121/72 133/87 110/69  Pulse: 70 78  82  Temp: 97.9 F (36.6 C) 97.5 F (36.4 C)  97.4 F (36.3 C)  TempSrc: Oral Oral  Oral  Resp: 18 18  20   Height:      Weight:      SpO2: 95% 93%  93%    General: Awake, in nad Cardiovascular: regular, s1, s2 Respiratory: normal resp effort, no wheezing  Discharge Instructions       Future Appointments Provider Department Dept Phone   08/21/2013 8:00 PM [facility] [place] [phone]   08/25/2013 8:30 AM [place], MD [place] [facility] [place] [phone]   08/25/2013 1:30 PM Juanito Doom, MD [facility] [place]  [phone]   09/13/2013 12:00 PM Juanito Doom, MD [facility] [phone]       Medication List    STOP taking these medications       atovaquone 750  MG/5ML suspension  Commonly known as:  MEPRON     mycophenolate 500 MG tablet  Commonly known as:  CELLCEPT     predniSONE 10 MG tablet  Commonly known as:  DELTASONE      TAKE these medications       acetaminophen 500 MG tablet  Commonly known as:  TYLENOL  Take 1,000 mg by mouth every 6 (six) hours as needed for moderate pain.     CHESTAL HONEY COUGH Syrp  Take 5 mLs by mouth every other day as needed.     dicyclomine 10 MG capsule  Commonly known as:  BENTYL  Take 1 capsule  (10 mg total) by mouth every 6 (six) hours as needed for spasms (cramping).     Eszopiclone 3 MG Tabs  Commonly known as:  eszopiclone  Take 1 tablet (3 mg total) by mouth at bedtime. Take immediately before bedtime     FIBER PO  Take 1 tablet by mouth daily.     fluticasone 50 MCG/ACT nasal spray  Commonly known as:  FLONASE  Place 2 sprays into the nose daily.     glipiZIDE 5 MG tablet  Commonly known as:  GLUCOTROL  Take 5 mg by mouth daily.     hydrochlorothiazide 12.5 MG capsule  Commonly known as:  MICROZIDE  Take 12.5 mg by mouth daily.     hydrocortisone cream 0.5 %  Apply 1 application topically 2 (two) times daily as needed (for rash).     levothyroxine 125 MCG tablet  Commonly known as:  SYNTHROID, LEVOTHROID  Take 125 mcg by mouth daily before breakfast.     montelukast 10 MG tablet  Commonly known as:  SINGULAIR  Take 10 mg by mouth at bedtime.     pantoprazole 40 MG tablet  Commonly known as:  PROTONIX  Take 1 tablet (40 mg total) by mouth 2 (two) times daily.     PROBIOTIC DAILY PO  Take 2 capsules by mouth 2 (two) times daily.     traMADol-acetaminophen 37.5-325 MG per tablet  Commonly known as:  ULTRACET  Take 1 tablet by mouth every 6 (six) hours as needed.     traZODone 50 MG tablet  Commonly known as:  DESYREL  Take 1 tablet (50 mg total) by mouth at bedtime.     verapamil 180 MG 24 hr capsule  Commonly known as:  VERELAN PM  Take 180 mg by mouth daily.       Allergies  Allergen Reactions  . Codeine Hives and Swelling  . Demerol [Meperidine] Hives and Swelling  . Hydrocodone Nausea Only  . Sulfa Antibiotics Rash   Follow-up Information   Follow up with Simonne Maffucci, MD On 08/15/2013. (430)    Specialty:  Pulmonary Disease   Contact information:   [address] [place] [address] [phone]       Follow up with Simonne Maffucci, MD On 08/25/2013. (130pm )    Specialty:  Pulmonary Disease   Contact  information:   [address]  [place] [address] [phone]       Follow up with [name], MD. Schedule an appointment as soon as possible for a visit in 1 week.   Specialty:  Family Medicine   Contact information:   [address] [place] [address] [phone]        The results of significant diagnostics from this hospitalization (including imaging, microbiology, ancillary  and laboratory) are listed below for reference.    Significant Diagnostic Studies: Dg Chest 2 View  08/11/2013   CLINICAL DATA:  Left-sided chest pain, evaluate UIP  EXAM: CHEST  2 VIEW  COMPARISON:  08/08/2013; 07/12/2013; 06/28/2013; high-resolution chest CT - 04/22/2013  FINDINGS: Grossly unchanged cardiac silhouette and mediastinal contours given persistently reduced lung volumes. Stable postsurgical change the right upper lobe. Grossly unchanged slightly nodular opacities about the bilateral hilum are grossly unchanged. No new focal airspace opacities. No definite pleural effusion or pneumothorax. No evidence of edema. Grossly unchanged bones.  IMPRESSION: 1. Persistent findings of hypoventilation and perihilar atelectasis without acute cardiopulmonary disease. Specifically, no definitive evidence of progression of provided history of interstitial lung disease. 2. Stable postsurgical change of the right upper lobe.   Electronically Signed   By: Sandi Mariscal M.D.   On: 08/11/2013 11:05   Dg Chest 2 View  08/08/2013   CLINICAL DATA:  Left chest pain.  Diabetes and hypertension.  EXAM: CHEST  2 VIEW  COMPARISON:  08/01/2013  FINDINGS: Heart size remains stable. Pulmonary interstitial prominence is stable. No evidence of acute infiltrate or edema. No evidence of pleural effusion. Mediastinal contours are stable.  IMPRESSION: Stable exam.  No active cardiopulmonary disease.   Electronically Signed   By: Earle Gell M.D.   On: 08/08/2013 16:14   Dg Chest 2  View  08/01/2013   CLINICAL DATA:  Dyspnea with fatigue and history of hypertension and obesity  EXAM: CHEST  2 VIEW  COMPARISON:  PA and lateral chest x-ray of July 12, 2013.  FINDINGS: The lungs are borderline hypoinflated. There is no focal infiltrate. The cardiopericardial silhouette is enlarged. The pulmonary vascularity is not engorged. There is no pleural effusion or pneumothorax. The mediastinum is normal in width. There is mild degenerative disc change of the mid and lower thoracic spine. There is no pleural effusion.  IMPRESSION: There is no evidence of pneumonia nor pulmonary edema. Mild enlargement of the cardiac silhouette is present and stable.   Electronically Signed   By: David  Martinique   On: 08/01/2013 16:49   Dg Abd 1 View  08/11/2013   CLINICAL DATA:  Abdominal pain. Small bowel follow-through was requested.  EXAM: ABDOMEN - 1 VIEW  COMPARISON:  CT 08/08/2013  FINDINGS: The bowel gas pattern is normal. Residual contrast is noted within the colon which precludes performance of the requested exam today. Numerous colonic diverticuli are noted. 3 mm left upper renal pole calculus reidentified. Dextrorotatory scoliosis of the lumbar spine is noted centered at L3. Right upper quadrant clips are reidentified.  IMPRESSION: Normal bowel gas pattern. Residual contrast in the colon precludes performance of small-bowel follow-through today.   Electronically Signed   By: Conchita Paris M.D.   On: 08/11/2013 10:25   Abd 1 View (kub)  08/01/2013   CLINICAL DATA:  Abdominal pain and history of constipation and colonic polyps  EXAM: ABDOMEN - 1 VIEW  COMPARISON:  None  FINDINGS: The bowel gas pattern suggests constipation. There is no evidence of ileus nor obstruction nor perforation. There are no abnormal soft tissue calcifications. There are surgical clips in the gallbladder fossa. There is mild degenerative disc change of the lumbar spine. There is gentle curvature of the lumbar spine with the  convexity towards the right. The observed portions of the bony pelvis exhibit no acute abnormalities.  IMPRESSION: The bowel gas pattern suggests constipation. There is no evidence of ileus nor obstruction.   Electronically Signed  By: David  Martin[place]   On: 08/01/2013 16:47   Ct Head Wo Contrast  08/02/2013   CLINICAL DATA:  Severe headache and dizziness. Altered mental status.  EXAM: CT HEAD WITHOUT CONTRAST  TECHNIQUE: Contiguous axial images were obtained from the base of the skull through the vertex without contrast.  COMPARISON:  None  FINDINGS: Normal appearance of the intracranial structures. No evidence for acute hemorrhage, mass lesion, midline shift, hydrocephalus or large infarct. No acute bony abnormality. The visualized sinuses are clear.  IMPRESSION: No acute intracranial abnormality.   Electronically Signed   By: Rolla Flatten M.D.   On: 08/02/2013 18:30   Ct Abdomen Pelvis W Contrast  08/08/2013   CLINICAL DATA:  Intermittent upper abdominal pain.  EXAM: CT ABDOMEN AND PELVIS WITH CONTRAST  TECHNIQUE: Multidetector CT imaging of the abdomen and pelvis was performed using the standard protocol following bolus administration of intravenous contrast.  CONTRAST:  170mL OMNIPAQUE IOHEXOL 300 MG/ML  SOLN  COMPARISON:  DG ABDOMEN 1V dated 08/01/2013  FINDINGS: Linear densities in the lung bases, likely atelectasis. Heart is borderline in size. No pleural effusions.  Prior cholecystectomy. Liver, spleen, pancreas, adrenals are unremarkable.  Bilateral punctate nonobstructing renal stones. No ureteral stones or hydronephrosis. Urinary bladder is unremarkable. 5 cm left midpole cyst.  Transverse, descending colonic and sigmoid diverticulosis. No changes of active diverticulitis. Small bowel is decompressed. No free fluid, free air or adenopathy. Uterus and adnexa are unremarkable. Aorta is normal caliber.  No acute bony abnormality.  IMPRESSION: Colonic diverticulosis.  No active diverticulitis.  No acute  findings in the abdomen or pelvis.  Prior cholecystectomy.  Bibasilar atelectasis.   Electronically Signed   By: Rolm Baptise M.D.   On: 08/08/2013 18:24    Microbiology: Recent Results (from the past 240 hour(s))  CLOSTRIDIUM DIFFICILE BY PCR     Status: None   Collection Time    08/09/13 11:26 AM      Result Value Range Status   C difficile by pcr NEGATIVE  NEGATIVE Final   Comment: Performed at [facility]     Status: None   Collection Time    08/09/13  4:45 PM      Result Value Range Status   Specimen Description STOOL   Final   Special Requests NONE   Final   Culture     Final   Value: NO SALMONELLA, SHIGELLA, CAMPYLOBACTER, YERSINIA, OR E.COLI 0157:H7 ISOLATED     Performed at [facility]   Report Status 08/13/2013 FINAL   Final  OVA AND PARASITE EXAMINATION     Status: None   Collection Time    08/09/13  4:45 PM      Result Value Range Status   Specimen Description STOOL   Final   Special Requests NONE   Final   Ova and parasites     Final   Value: NO OVA OR PARASITES SEEN     Performed at [facility]   Report Status 08/10/2013 FINAL   Final     Labs: Basic Metabolic Panel:  Recent Labs Lab 08/08/13 1350 08/09/13 0945  NA 135* 141  K 4.5 4.1  CL 98 101  CO2 23 27  GLUCOSE 192* 132*  BUN 9 9  CREATININE 0.57 0.65  CALCIUM 9.4 9.6   Liver Function Tests:  Recent Labs Lab 08/08/13 1350  AST 15  ALT 22  ALKPHOS 69  BILITOT 0.5  PROT 7.3  ALBUMIN  3.8    Recent Labs Lab 08/09/13 0945  LIPASE 15   No results found for this basename: AMMONIA,  in the last 168 hours CBC:  Recent Labs Lab 08/08/13 1350 08/09/13 0945  WBC 9.8 7.2  NEUTROABS 8.0*  --   HGB 15.0 14.8  HCT 44.6 44.6  MCV 94.9 94.9  PLT 249 213   Cardiac Enzymes: No results found for this basename: CKTOTAL, CKMB, CKMBINDEX, TROPONINI,  in the last 168 hours BNP: BNP (last 3 results) No results found for this basename: PROBNP,  in the  last 8760 hours CBG:  Recent Labs Lab 08/12/13 1144 08/12/13 1725 08/12/13 2115 08/13/13 0653 08/13/13 1123  GLUCAP 132* 162* 147* 180* 167*    Signed:  CHIU, STEPHEN K  [facility] 08/13/2013, 4:02 PM

## 2013-08-15 ENCOUNTER — Encounter (HOSPITAL_COMMUNITY): Payer: Self-pay | Admitting: Internal Medicine

## 2013-08-15 ENCOUNTER — Ambulatory Visit: Payer: BC Managed Care – PPO | Admitting: Pulmonary Disease

## 2013-08-15 ENCOUNTER — Encounter: Payer: Self-pay | Admitting: Internal Medicine

## 2013-08-16 ENCOUNTER — Telehealth: Payer: Self-pay | Admitting: Pulmonary Disease

## 2013-08-16 ENCOUNTER — Ambulatory Visit (HOSPITAL_BASED_OUTPATIENT_CLINIC_OR_DEPARTMENT_OTHER): Payer: BC Managed Care – PPO | Attending: Pulmonary Disease | Admitting: Radiology

## 2013-08-16 VITALS — Ht 66.0 in | Wt 240.0 lb

## 2013-08-16 DIAGNOSIS — Z9989 Dependence on other enabling machines and devices: Secondary | ICD-10-CM

## 2013-08-16 DIAGNOSIS — Z6838 Body mass index (BMI) 38.0-38.9, adult: Secondary | ICD-10-CM | POA: Insufficient documentation

## 2013-08-16 DIAGNOSIS — R0609 Other forms of dyspnea: Secondary | ICD-10-CM | POA: Insufficient documentation

## 2013-08-16 DIAGNOSIS — J8410 Pulmonary fibrosis, unspecified: Secondary | ICD-10-CM

## 2013-08-16 DIAGNOSIS — G4733 Obstructive sleep apnea (adult) (pediatric): Secondary | ICD-10-CM | POA: Insufficient documentation

## 2013-08-16 DIAGNOSIS — R0989 Other specified symptoms and signs involving the circulatory and respiratory systems: Secondary | ICD-10-CM | POA: Insufficient documentation

## 2013-08-16 NOTE — Telephone Encounter (Signed)
Spoke with pt and her friend, Pt had not began taking Cellcept as she was instructed to on the 16th.  She stated that she felt uneasy taking the medication due to her uneasy stomach.  I reviewed the recommended course of medication.  The patient and her friend seemed very concerned to start the medication.  I advised that the course of tx could not be caught up before the visit being 11 days behind.  The pt has decided not to take this medication and will wait to speak to BQ about another course of tx on her 08/25/13 visit.  Nothing further needed at this time.

## 2013-08-20 ENCOUNTER — Telehealth: Payer: Self-pay | Admitting: Internal Medicine

## 2013-08-20 DIAGNOSIS — G4733 Obstructive sleep apnea (adult) (pediatric): Secondary | ICD-10-CM

## 2013-08-20 DIAGNOSIS — J8410 Pulmonary fibrosis, unspecified: Secondary | ICD-10-CM

## 2013-08-20 NOTE — Sleep Study (Signed)
   NAME: Kaitlin Hamilton DATE OF BIRTH:  Dec 05, 1950 MEDICAL RECORD NUMBER [id number]  LOCATION: [facility]  PHYSICIAN: Kathee Delton  DATE OF STUDY: 08/16/2013  SLEEP STUDY TYPE: Nocturnal Polysomnogram               REFERRING PHYSICIAN: Juanito Doom, MD  INDICATION FOR STUDY: Hypersomnia with sleep apnea  EPWORTH SLEEPINESS SCORE:  3 HEIGHT: 5\' 6"  (167.6 cm)  WEIGHT: 240 lb (108.863 kg)    Body mass index is 38.76 kg/(m^2).  NECK SIZE: 16.5 in.  MEDICATIONS: Reviewed in the sleep record.  SLEEP ARCHITECTURE: The patient had a total sleep time of 354 minutes with very little slow-wave sleep and decreased quantity of REM. Sleep onset latency was normal at 14 minutes, and REM onset did not occur until the titration portion of the study. Sleep efficiency was 77% during the diagnostic portion of the study, an 87% during the titration portion.  RESPIRATORY DATA: The patient underwent a split night protocol where she was found to have 72 obstructive events in the first 100 minutes of sleep. This gave her an AHI during the diagnostic portion of the study of 43 events per hour. The events occurred in all body positions, but were increased in the supine position. Loud snoring was noted throughout. By protocol, the patient was then fitted with a medium ResMed Quattro full face mask, and CPAP titration was initiated. The patient's pressure was increased in order to control both obstructive events and snoring, and a final pressure of 17 cm of water was reached by the end of the study. There was not adequate time for further titration, and would consider doing an auto titration at home once the patient has been desensitized to CPAP.  OXYGEN DATA: There was oxygen desaturation as low as 76%. It should be noted the patient's study was done on 2 L of nasal cannula.  CARDIAC DATA: Occasional PVC noted  MOVEMENT/PARASOMNIA: No significant leg jerks or other abnormal behaviors were  seen.  IMPRESSION/ RECOMMENDATION:    1) split-night study reveals severe obstructive sleep apnea, with an AHI of 43 events per hour and oxygen desaturation as low as 76% during the diagnostic portion of the study. The patient was then fitted with a medium ResMed Quattro full face mask, and titrated to a final pressure of 17 cm of water. It is unclear if this is her optimal pressure, since there was not further time to continue observation on the final pressure. The patient should also be encouraged to work aggressively on weight loss.  2) occasional PVC noted during the night, but no clinically significant arrhythmias were seen     Kathee Delton Diplomate, American Board of Sleep Medicine  ELECTRONICALLY SIGNED ON:  08/20/2013, 4:19 PM [place] PH: [phone]   FX: [phone] [place]

## 2013-08-20 NOTE — Telephone Encounter (Signed)
Received call from patient's daughter. Patient has started developing itching in AM after taking atovaquone. Has been medicating with Benadryl. (-) Rash. Cellcept also new but taking that in evening. Advised patient to stop Atovaqoune and see if itching stops. Either way, patient to call Dr. [name] on Monday and she and he can decide on future plan.

## 2013-08-21 ENCOUNTER — Encounter (HOSPITAL_BASED_OUTPATIENT_CLINIC_OR_DEPARTMENT_OTHER): Payer: BC Managed Care – PPO

## 2013-08-22 ENCOUNTER — Encounter: Payer: Self-pay | Admitting: Pulmonary Disease

## 2013-08-22 ENCOUNTER — Telehealth: Payer: Self-pay | Admitting: Pulmonary Disease

## 2013-08-22 DIAGNOSIS — G4733 Obstructive sleep apnea (adult) (pediatric): Secondary | ICD-10-CM | POA: Insufficient documentation

## 2013-08-22 NOTE — Telephone Encounter (Signed)
Pt daughter advised. Reyhan Moronta, CMA  

## 2013-08-22 NOTE — Telephone Encounter (Signed)
Have her hold it until she sees me

## 2013-08-22 NOTE — Telephone Encounter (Signed)
Called and spoke with daughter. She reports over the weekend pt had a reaction to the atovaquone-itching in legs and feeling flushed. Called the on call doc and was advised to take benadryl and leave off the atovaquone until they spoke with [place]. Pt did not take this medication yesterday and did not have the allergic reaction. Pt has pending appt with Dr. [name] on 08/25/13. Wants to know if they should wait until they come in to discuss this or if she needs to be on something else? Please advise thanks  Allergies  Allergen Reactions  . Codeine Hives and Swelling  . Demerol [Meperidine] Hives and Swelling  . Hydrocodone Nausea Only  . Sulfa Antibiotics Rash

## 2013-08-22 NOTE — Addendum Note (Signed)
Addended by: Len Blalock on: 08/22/2013 10:33 AM   Modules accepted: Orders

## 2013-08-22 NOTE — Telephone Encounter (Signed)
ATC PT daughter NA. VM is not set up yet. WCB

## 2013-08-25 ENCOUNTER — Ambulatory Visit (INDEPENDENT_AMBULATORY_CARE_PROVIDER_SITE_OTHER): Payer: BC Managed Care – PPO | Admitting: Pulmonary Disease

## 2013-08-25 ENCOUNTER — Encounter: Payer: Self-pay | Admitting: Pulmonary Disease

## 2013-08-25 ENCOUNTER — Encounter: Payer: Self-pay | Admitting: Family Medicine

## 2013-08-25 ENCOUNTER — Ambulatory Visit (INDEPENDENT_AMBULATORY_CARE_PROVIDER_SITE_OTHER): Payer: BC Managed Care – PPO | Admitting: Family Medicine

## 2013-08-25 VITALS — BP 120/88 | HR 86 | Temp 97.6°F | Ht 66.0 in | Wt 239.5 lb

## 2013-08-25 VITALS — BP 116/76 | HR 85 | Ht 66.0 in | Wt 239.0 lb

## 2013-08-25 DIAGNOSIS — E1169 Type 2 diabetes mellitus with other specified complication: Secondary | ICD-10-CM | POA: Insufficient documentation

## 2013-08-25 DIAGNOSIS — J8410 Pulmonary fibrosis, unspecified: Secondary | ICD-10-CM

## 2013-08-25 DIAGNOSIS — R1013 Epigastric pain: Secondary | ICD-10-CM

## 2013-08-25 DIAGNOSIS — K219 Gastro-esophageal reflux disease without esophagitis: Secondary | ICD-10-CM

## 2013-08-25 DIAGNOSIS — E785 Hyperlipidemia, unspecified: Secondary | ICD-10-CM

## 2013-08-25 DIAGNOSIS — G43009 Migraine without aura, not intractable, without status migrainosus: Secondary | ICD-10-CM

## 2013-08-25 DIAGNOSIS — E7800 Pure hypercholesterolemia, unspecified: Secondary | ICD-10-CM

## 2013-08-25 DIAGNOSIS — E119 Type 2 diabetes mellitus without complications: Secondary | ICD-10-CM

## 2013-08-25 DIAGNOSIS — E039 Hypothyroidism, unspecified: Secondary | ICD-10-CM

## 2013-08-25 DIAGNOSIS — G4733 Obstructive sleep apnea (adult) (pediatric): Secondary | ICD-10-CM

## 2013-08-25 DIAGNOSIS — E1159 Type 2 diabetes mellitus with other circulatory complications: Secondary | ICD-10-CM | POA: Insufficient documentation

## 2013-08-25 DIAGNOSIS — G4700 Insomnia, unspecified: Secondary | ICD-10-CM

## 2013-08-25 DIAGNOSIS — I10 Essential (primary) hypertension: Secondary | ICD-10-CM

## 2013-08-25 DIAGNOSIS — J84112 Idiopathic pulmonary fibrosis: Secondary | ICD-10-CM

## 2013-08-25 LAB — HM DIABETES FOOT EXAM

## 2013-08-25 MED ORDER — GLIPIZIDE ER 10 MG PO TB24: 10.0000 mg | ORAL_TABLET | Freq: Every day | ORAL | Status: DC

## 2013-08-25 NOTE — Patient Instructions (Addendum)
Stop your current glipizide.. Change to 10 mg long acting glucotrol. Schedule CPX in in April with fasting labs prior. Gradually get back to healthy eating, weight loss.  Goal Fasting BS 80-120, too low is < 60...drink 4 oz of OJ, then repeat the test.  Goal 2 hour after meals < 180.    Follow up in 2 weeks DM.

## 2013-08-25 NOTE — Patient Instructions (Signed)
We will set up a CPAP machine in your house Stay on cellcept 500mg  daily until you see me next If you cough up more mucus or feel worse, take the doxycycline We will see you back at the end of the month

## 2013-08-25 NOTE — Assessment & Plan Note (Signed)
This has been a stable interval for Lanie, but we need to ramp up her therapy as her disease has been steroid responsive.  Obviously we have been limited by her side intolerance of the high dose prednisone and apparent allergy to bactrim and atovaquone.  Plan: -continue cellcept 500mg  daily for now -f/u three weeks, if stable, increase cellcept to bid, start prednisone 10mg  daily and start prophylactic anbitiotic (consider bactrim DS MWF vs dapsone) -check for G6PD def today in case we need to use Dapsone

## 2013-08-25 NOTE — Assessment & Plan Note (Signed)
Gastritis due to prednisone (not cellcept), better

## 2013-08-25 NOTE — Progress Notes (Signed)
Subjective:    Patient ID: Kaitlin Hamilton, female    DOB: [birth year], 63 y.o.   MRN: [id number]  Synopsis: Kaitlin Hamilton is a very pleasant 63 year old female who first saw the [facility] pulmonary clinic in March 2014 for evaluation of shortness of breath. She had significant cough, wheezing, and sputum production requiring multiple rounds of antibiotics. Because of crackles on lung exam and an abnormal pulmonary function test she was referred to [place]. We ordered full pulmonary function testing which showed moderate restriction and a depressed DLCO in proportion to her restriction. There is no airflow obstruction and no change with bronchodilator administration. A chest x-ray performed in February 2014 was read as normal.  HPI   11/16/2012 ROV -- [name] feels that her cough and dyspnea is better since our last visit.  She is still taking Advair twice a day and rarely has to use her albuterol inhaler. She did use albuterol over the weekend when she was cleaning out a closet that was full of a lot of dust and mold. This made her short of breath and had chest congestion. The albuterol helped significantly. She stated that when she use the albuterol for the pulmonary function test yesterday she felt significant improvement in her breathing. Otherwise she is doing very well and has no symptoms.  04/19/2013 ROV -- [name] has had a rough time since her last visit. Unfortunately she never had a blood work done nor the barium swallow which we recommended last time. Approximately one month ago she developed a cough with some sputum production. She was treated with Augmentin as well as prednisone. She was also started on a Dulera inhaler at some point in the last month. She said that the prednisone deathly made her feel better but then her cough returned not long after she stopped that medicine. She has had some shortness of breath with this lately. The [facility] has not made any difference that she can tell. However she  does feel that the [facility] is making her blood sugar elevated.  She has been producing green sputum lately, particularly in the last three days.  05/17/2013 ROV > Bryley feels like her breathing is better since the last visit.  She has been having ear pain for about 2-3 days in R ear. She has also noted some sinus symptoms since the last visit.   She continues to have a cough at night, but it is better overall and in the morning . When she bends over she has more cough.  She wonders if it is the carpet in her room. She typically works in the same room, but working in other rooms doesn't seem to make a difference in those.  Dyspnea has improved somewhat.    06/24/2013 ROV > [name] has had increased dyspnea, cough, and green mucus production ever since stopping the prednisone last week. No fevers or chills.  The dyspnea is mild.  She has not had chest pain or body aches, rash, or leg swelling.  No weight change.    She had an endoscopy which showed a Schatzki ring which was dilated by Dr. Hilarie Fredrickson.  She also had gastropathy which was H.Pylori positive.  07/27/2012 ROV >> Since surgery, she has only had two or three nights of good sleep.  The prednisone is really interfering with her sleep.  She is on a bad sleep cycle.  Last week she slept seven hours. Her family notes that she snores.    She is still having some dyspnea,  typically it is better on days when she gets more sleep. She has had some problem with dyspnea, not clear if worse since before surery or not.  She has had a hacking cough, but it has been mostly dry for the last few days.    She is taking protonix.  07/29/2013 Acute Wert> rash, bactrim held  08/01/2013 Acute >> Since our last visit [name] started Cellcept and Bactrim the following day and she started a rash with hives.  She saw Wert and he stopped the bactrim.  They noted that she took two cellcept the first day.   Her hives and rash were getting worse over the weekend but it has subtly  improved.. The biggest problem at this point is confusion.  She really hasn't slept much.  She has had a lot of abdominal pain over the last few nights with nausea and vomiting.  The pain is epigastric.  She has been taking her flagyl lately without improvement.  Her c.diff toxin assay was negative.  She has not had significant respiratory problems. The noted that they took too much cellcept on 1/8 (they took $RemoveBe'500mg'ueeidHCfE$  bid instead of daily).  08/25/2013 HFU > She feels that her stomach is better since leaving the hospital.  For some reason she started taking the atovaquone for four days after leaving the hospital.  She had a rash and itching after this, so she stopped.  She continues to take the cellcept at $RemoveBef'500mg'payVPDlHyi$  daily.  She has been some short of breath and has had some green mucus production.  She has had some tightness in her chest.  She is sleeping with her oxygen at night. Overall her energy level is much better and she is doing well.    Past Medical History  Diagnosis Date  . Bronchitis   . Chicken pox   . Depression   . Allergic rhinitis     uses Flonase daily  . Hyperlipidemia     lost 43 pounds and no meds required at present  . Hypokalemia   . Anxiety   . PONV (postoperative nausea and vomiting)   . Hypertension     takes Verapamil and HCTZ daily  . Shortness of breath     with exertion;takes Singulair daily as well as Flonase  . Pneumonia     last time in 2006  . History of bronchitis     2013  . H/O hiatal hernia   . Migraines     last one about a month ago  . Dysphagia   . Joint swelling     left thumb  . OA (osteoarthritis)     left knee  . GERD (gastroesophageal reflux disease)     takes Protonix daily  . Hemorrhoids   . History of colon polyps   . Diverticulosis   . Urinary frequency   . History of kidney stones   . Non-insulin dependent type 2 diabetes mellitus     takes Glipizide daily  . Constipation     takes Fiber daily  . Hypothyroidism (acquired)     takes  Synthroid daily  . Insomnia     doesn't take any meds for this        Review of Systems  Constitutional: Negative for fever, chills, activity change and fatigue.  HENT: Negative for congestion, ear pain, nosebleeds, postnasal drip and rhinorrhea.   Respiratory: Positive for cough. Negative for shortness of breath and wheezing.   Cardiovascular: Negative for chest pain, palpitations and leg  swelling.  Gastrointestinal: Negative for nausea and abdominal pain.       Objective:   Physical Exam   Filed Vitals:   08/25/13 1326  BP: 116/76  Pulse: 85  Height: 5\' 6"  (1.676 m)  Weight: 239 lb (108.41 kg)  SpO2: 96%  Room Air  Gen: awake, alert, well appearing HEENT: NCAT,  EOMi, OP clear,  PULM: Crackles 1/3 way up bilaterally CV: RRR, no mgr, no JVD AB: BS+, soft, mild epigastric tenderness, no hsm Ext: warm, trace leg edema, no clubbing, no cyanosis Neuro: awake, alert, maew  February 2014 simple spirometry performed by her primary care physician>> ratio 90%, FEV1 1.71 L (64% predicted, FVC 1.83 L 55% predicted; flow volume loop is not consistent with obstruction February 2014 chest x-ray at [facility] normal 10/19/2012 walked 500 feet in office on room air oxygenation did not drop below 90% 11/15/2012 Full PFT [name]> Ratio 87%, FEV1 2.00L > 2.07 with bronchodilator (3% change); TLC 3.44 L (65% pred), ERV 0.62 (57% pred), DLCO 15.1 ml/mmHg/min (59% pred) 11/2012 CT chest >> ([facility] read) centrilobular nodules, interlobular septal thickening worse in bases and periphery R lung > L; some GGO in bases and periphery as well, some bronchiectasis in the bases R > L; findings suggestive of fibrosis but not UIP; question hypersensitivity pneumonitis given centrilobular nodules; also question aspiration 03/2013 ANA, ANCA, Anti-Jo-1, ESR, RF, SCL-70, anti-centromere, SSA/SSB, all negative; CRP 0.6;  04/2013 Barium swallow> abnormal esophageal motility, GERD, hiatal hernia 04/2013 Full PFT>  Ratio 93%, FEV1 1.92 L (71% pred), TLC 3.02L (56% pred), DLCO 15.94 (59% pred) 04/2013 CT chest ([name])> findings suggestive of but not diagnostic of NSIP, small pulmonary nodule 04/2013 6MW RA > 1100 feet, HR peak 109, O2 sat Nadir 87% 04/2013 HP panel >> 05/17/2013 3 month prednisone trial 06/2013 open lung biopsy> UIP 07/28/13 Cell cept and bactrim started > bactrim stopped 07/29/13 due to rash  07/2013 hospitalized for confusion > prednisone 07/2013 hospitalized for gastritis/abdominal pain> prednisone 08/22/2013 atovaquone caused rash      Assessment & Plan:   UIP (usual interstitial pneumonitis) This has been a stable interval for Olivia, but we need to ramp up her therapy as her disease has been steroid responsive.  Obviously we have been limited by her side intolerance of the high dose prednisone and apparent allergy to bactrim and atovaquone.  Plan: -continue cellcept 500mg  daily for now -f/u three weeks, if stable, increase cellcept to bid, start prednisone 10mg  daily and start prophylactic anbitiotic (consider bactrim DS MWF vs dapsone) -check for G6PD def today in case we need to use Dapsone  OSA (obstructive sleep apnea) She has severe OSA as her AHI was 44 and her O2 sat nadir was 72% on the recent polysomonogram  Plan: -Set up Resmed 9 auto-titrating device with three week download -Set up ONO  Abdominal pain, epigastric Gastritis due to prednisone (not cellcept), better    Updated Medication List Outpatient Encounter Prescriptions as of 08/25/2013  Medication Sig  . acetaminophen (TYLENOL) 500 MG tablet Take 1,000 mg by mouth every 6 (six) hours as needed for moderate pain.  . diphenhydrAMINE (BENADRYL) 25 mg capsule Take 25 mg by mouth as needed.  Marland Kitchen FIBER PO Take 1 tablet by mouth daily.  . fluticasone (FLONASE) 50 MCG/ACT nasal spray Place 2 sprays into the nose daily.   Marland Kitchen glipiZIDE (GLUCOTROL XL) 10 MG 24 hr tablet Take 1 tablet (10 mg total) by mouth daily with  breakfast.  . Homeopathic  Products (CHESTAL HONEY COUGH) SYRP Take 5 mLs by mouth every other day as needed.  . hydrochlorothiazide (MICROZIDE) 12.5 MG capsule Take 12.5 mg by mouth daily.  . Hydrocodone-Chlorpheniramine 5-4 MG/5ML SOLN One teaspoonful every 12 hours if needed for cough  . hydrocortisone cream 0.5 % Apply 1 application topically 2 (two) times daily as needed (for rash).  Marland Kitchen levothyroxine (SYNTHROID, LEVOTHROID) 125 MCG tablet Take 125 mcg by mouth daily before breakfast.  . montelukast (SINGULAIR) 10 MG tablet Take 10 mg by mouth at bedtime.  . mycophenolate (CELLCEPT) 500 MG tablet Take 500 mg by mouth 2 (two) times daily.  . pantoprazole (PROTONIX) 40 MG tablet Take 1 tablet (40 mg total) by mouth 2 (two) times daily.  . predniSONE (DELTASONE) 10 MG tablet Take 10 mg by mouth daily with breakfast.  . Probiotic Product (PROBIOTIC DAILY PO) Take 2 capsules by mouth 2 (two) times daily.  . traMADol-acetaminophen (ULTRACET) 37.5-325 MG per tablet Take 1 tablet by mouth every 6 (six) hours as needed.  . traZODone (DESYREL) 50 MG tablet Take 1 tablet (50 mg total) by mouth at bedtime.  . verapamil (VERELAN PM) 180 MG 24 hr capsule Take 180 mg by mouth daily.

## 2013-08-25 NOTE — Progress Notes (Signed)
Subjective:    Patient ID: Kaitlin Hamilton, female    DOB: 1950/08/18, 63 y.o.   MRN: [id number]  HPI  63 year old female with complicated recent medical histor including diagnosis of UIP followed by pulmonology ( Dr. [name]), esophageal dysmotility due to schatzki ring followed and dilated by GI ( Dr. Hilarie Fredrickson),  DM and OSA presents to establish care.   She first saw the [facility] pulmonary clinic in March 2014 for evaluation of shortness of breath.   She was hospitalized in 06/2013 and 2 times in 07/2012. 12/10- 07/04/2013: Underwent a right thoracoscopic lung biopsy on 06/29/2013  At that point dx with UIP.  On prednisone  In interim she was started on cellcept as outpt  1/12 to 08/03/2013 hospitalization was confusion,abdominal discomfort Dx with metabolic encephalopathy:  Felt could be secondary to recent medications as patient was started on Bactrim, prednisone, CellCept. -pulmonary consult to hold CellCept until January 16, discontinue Bactrim, and quick prednisone taper  -cellcept held until 1/16 and will be started at 500 mg daily for 1 week until 1/23 then 500 mg bid until she sees Dr [name]. Quick prednisone taper , received 10 mg today then stop until 1/16. She will then resume prednisone at 10 mg daily until her f/up with Dr [name].   Placed on atovaquone for PCP prophylaxsis, TSH, vitamin B12 levels, folate, RPR, ammonia, uremia levels reviewed and within normal limits  - Workup negative for source of infection      08/13/2013 Admitted for epigastric abdominal pain from GI's office. Cdiff was negative and abd pelvis CT was unremarkable EGD on 1/23 with findings of gastritis   Thought symptoms were - Possible side effect of CellCept   Held cellcept as well as prednisone and atovaquone  She has now started cellcept back, but had  to stop atovaquone given itching and rash. Rash now resolved. She no longer had upper abdominal pain in the last 5 days.  She has appt  with Dr. [name] this afternoon.  Plans to keep her on cellcept for 18 months then re-eval lungs.  GERD: well controlled on protonix.   Insomnia  Improved on trazodone.  Recent sleep study diagnosed OSA: getting set up with CPAP soon.  DM, poor control: She states that recently control has worsened.  On gluctrol 5 mg daily.  Was previously on glucotrol twice daily ( a1C 6.7 at 2014. Never been on other medication. Using medications without difficulties: Hypoglycemic episodes:none Hyperglycemic episodes: yes Feet problems: None Blood Sugars averaging: FBS: 158-220,  eye exam within last year: Jun 23 2013  Lab Results  Component Value Date   HGBA1C 8.5* 08/09/2013    Hypertension:  Well controlled on verapamil and HCTZ  Using medication without problems or lightheadedness: None Chest pain with exertion:None Edema:None Short of breath:no change Average home BPs:117/75 Other issues: Wt Readings from Last 3 Encounters:  08/25/13 239 lb 8 oz (108.636 kg)  08/16/13 240 lb (108.863 kg)  08/12/13 240 lb 1.3 oz (108.9 kg)    Hypothyroid: stable control Lab Results  Component Value Date   TSH 0.853 08/01/2013       Review of Systems  Constitutional: Negative for fever and fatigue.  HENT: Negative for ear pain.   Eyes: Negative for pain.  Respiratory: Negative for chest tightness and shortness of breath.   Cardiovascular: Negative for chest pain, palpitations and leg swelling.  Gastrointestinal: Negative for abdominal pain.  Genitourinary: Negative for dysuria.  Objective:   Physical Exam  Constitutional: Vital signs are normal. She appears well-developed and well-nourished. She is cooperative.  Non-toxic appearance. She does not appear ill. No distress.  Overweight female in NAD  HENT:  Head: Normocephalic.  Right Ear: Hearing, tympanic membrane, external ear and ear canal normal. Tympanic membrane is not erythematous, not retracted and not bulging.  Left Ear:  Hearing, tympanic membrane, external ear and ear canal normal. Tympanic membrane is not erythematous, not retracted and not bulging.  Nose: No mucosal edema or rhinorrhea. Right sinus exhibits no maxillary sinus tenderness and no frontal sinus tenderness. Left sinus exhibits no maxillary sinus tenderness and no frontal sinus tenderness.  Mouth/Throat: Uvula is midline, oropharynx is clear and moist and mucous membranes are normal.  Eyes: Conjunctivae, EOM and lids are normal. Pupils are equal, round, and reactive to light. Lids are everted and swept, no foreign bodies found.  Neck: Trachea normal and normal range of motion. Neck supple. Carotid bruit is not present. No mass and no thyromegaly present.  Cardiovascular: Normal rate, regular rhythm, S1 normal, S2 normal, normal heart sounds, intact distal pulses and normal pulses.  Exam reveals no gallop and no friction rub.   No murmur heard. Pulmonary/Chest: Effort normal and breath sounds normal. Not tachypneic. No respiratory distress. She has no decreased breath sounds. She has no wheezes. She has no rhonchi. She has no rales.  Abdominal: Soft. Normal appearance and bowel sounds are normal. There is no tenderness.  Neurological: She is alert.  Skin: Skin is warm, dry and intact. No rash noted.  Psychiatric: Her speech is normal and behavior is normal. Judgment and thought content normal. Her mood appears not anxious. Cognition and memory are normal. She does not exhibit a depressed mood.          Assessment & Plan:

## 2013-08-25 NOTE — Assessment & Plan Note (Signed)
She has severe OSA as her AHI was 44 and her O2 sat nadir was 72% on the recent polysomonogram  Plan: -Set up Resmed 9 auto-titrating device with three week download -Set up ONO

## 2013-08-25 NOTE — Progress Notes (Signed)
Pre-visit discussion using our clinic review tool. No additional management support is needed unless otherwise documented below in the visit note.  

## 2013-08-26 ENCOUNTER — Telehealth: Payer: Self-pay | Admitting: Pulmonary Disease

## 2013-08-26 ENCOUNTER — Telehealth: Payer: Self-pay | Admitting: *Deleted

## 2013-08-26 LAB — GLUCOSE 6 PHOSPHATE DEHYDROGENASE: G-6PDH: 12.4 U/g{Hb} (ref 7.0–20.5)

## 2013-08-26 NOTE — Telephone Encounter (Signed)
Kaitlin Hamilton.Kaitlin Hamilton A  

## 2013-08-26 NOTE — Telephone Encounter (Signed)
Error.Kentaro Alewine A  

## 2013-08-29 ENCOUNTER — Telehealth: Payer: Self-pay | Admitting: Family Medicine

## 2013-08-29 MED ORDER — GLIPIZIDE 10 MG PO TABS: 10.0000 mg | ORAL_TABLET | Freq: Two times a day (BID) | ORAL | Status: DC

## 2013-08-29 NOTE — Telephone Encounter (Signed)
Spoke with Kaitlin Hamilton.  She states she started the Glipizide XL 10 mg on Friday.  She took it Friday and Saturday morning and a couple hours later on Saturday she broke out in a rash. She took benadryl to help with the rash.  She states so Sunday she took her Glipizide 5 mg one in the morning and one in the afternoon.  [name] also states that she fell Saturday night and hurt both of her legs but her concern right now is her blood sugars.  She reports her blood sugar yesterday morning was 172 mg/dl, last night at 8:15pm was 249 mg/dl and this morning at 8:27 am was 160 mg/dl.

## 2013-08-29 NOTE — Telephone Encounter (Signed)
Have her increase short acting  glipizide to 10 mg twice daily. Change in med list. If tolerating but CBGs elevated have her call back in 1 week. Call sooner if not tolerating this.

## 2013-08-29 NOTE — Telephone Encounter (Signed)
Please call to clarify... Did she get the rash from the higher dose of her current med or did she have the rash from the prescription for the new long acting form?

## 2013-08-29 NOTE — Telephone Encounter (Signed)
Patient notified as instructed by telephone.  New prescription for Glipizide 10 mg sent to pharmacy.

## 2013-08-29 NOTE — Telephone Encounter (Signed)
Pt says Dr. [name] changed her diabetes medication and she had a bad reaction to it. She says she broke out all over and her face started burning. She called the pharmacy and they told her not to take it until she spoke with you first. Please advise.

## 2013-09-09 ENCOUNTER — Ambulatory Visit (INDEPENDENT_AMBULATORY_CARE_PROVIDER_SITE_OTHER): Payer: BC Managed Care – PPO | Admitting: Family Medicine

## 2013-09-09 ENCOUNTER — Encounter: Payer: Self-pay | Admitting: Family Medicine

## 2013-09-09 VITALS — BP 132/80 | HR 85 | Temp 97.9°F | Ht 66.0 in | Wt 240.8 lb

## 2013-09-09 DIAGNOSIS — S93401A Sprain of unspecified ligament of right ankle, initial encounter: Secondary | ICD-10-CM

## 2013-09-09 DIAGNOSIS — M79609 Pain in unspecified limb: Secondary | ICD-10-CM

## 2013-09-09 DIAGNOSIS — E119 Type 2 diabetes mellitus without complications: Secondary | ICD-10-CM

## 2013-09-09 DIAGNOSIS — M25532 Pain in left wrist: Secondary | ICD-10-CM | POA: Insufficient documentation

## 2013-09-09 DIAGNOSIS — M79642 Pain in left hand: Secondary | ICD-10-CM

## 2013-09-09 DIAGNOSIS — S93409A Sprain of unspecified ligament of unspecified ankle, initial encounter: Secondary | ICD-10-CM

## 2013-09-09 NOTE — Assessment & Plan Note (Signed)
RICE treatment. AIr cast given.  Prednisone to be started ( will be good antinflammatory.  if not improving consider X-ray at follow up ion 2 weeks.

## 2013-09-09 NOTE — Progress Notes (Signed)
   Subjective:    Patient ID: Kaitlin Hamilton, female    DOB: June 27, 1951, 63 y.o.   MRN: [id number]  HPI  63 year old female with  DM and UIP ( will restart next week:  prednisone) presents for 2 weeks follow up DM. She had rash with glucotrol XL. She was able to tolerate fast acting twice a day.   FBS: 117-151, 2 hours post prandial 150-170 She is feeling well, but still deconditioned.  Also 2 new issues:  2 weeks ago twisted right foot and ankle when she lost balance.  She has intermittent swelling, lateral  ankle pain, as well as some less tenderness on  medial ankle.  No pain at rest, pain with walking, standing.   Caught  Herself on left hand. Tenderness at base of left hand. No bruise, initial swelling. Pain improving with time.   Using ibuprofen.     Review of Systems  Constitutional: Positive for fatigue. Negative for fever.  HENT: Negative for ear pain.   Eyes: Negative for pain.  Respiratory: Positive for shortness of breath. Negative for wheezing.   Cardiovascular: Negative for chest pain.  Gastrointestinal: Negative for abdominal pain.       Objective:   Physical Exam  Constitutional: Vital signs are normal. She appears well-developed and well-nourished. She is cooperative.  Non-toxic appearance. She does not appear ill. No distress.  Obese appearing female in NAD  HENT:  Head: Normocephalic.  Right Ear: Hearing, tympanic membrane, external ear and ear canal normal. Tympanic membrane is not erythematous, not retracted and not bulging.  Left Ear: Hearing, tympanic membrane, external ear and ear canal normal. Tympanic membrane is not erythematous, not retracted and not bulging.  Nose: No mucosal edema or rhinorrhea. Right sinus exhibits no maxillary sinus tenderness and no frontal sinus tenderness. Left sinus exhibits no maxillary sinus tenderness and no frontal sinus tenderness.  Mouth/Throat: Uvula is midline, oropharynx is clear and moist and mucous membranes are  normal.  Eyes: Conjunctivae, EOM and lids are normal. Pupils are equal, round, and reactive to light. Lids are everted and swept, no foreign bodies found.  Neck: Trachea normal and normal range of motion. Neck supple. Carotid bruit is not present. No mass and no thyromegaly present.  Cardiovascular: Normal rate, regular rhythm, S1 normal, S2 normal, normal heart sounds, intact distal pulses and normal pulses.  Exam reveals no gallop and no friction rub.   No murmur heard. Pulmonary/Chest: Effort normal and breath sounds normal. Not tachypneic. No respiratory distress. She has no decreased breath sounds. She has no wheezes. She has no rhonchi. She has no rales.  Abdominal: Soft. Normal appearance and bowel sounds are normal. There is no tenderness.  Musculoskeletal:       Right ankle: She exhibits decreased range of motion and swelling. Tenderness. Lateral malleolus tenderness found. No head of 5th metatarsal and no proximal fibula tenderness found.       Right hand: She exhibits decreased range of motion and tenderness. She exhibits no bony tenderness.       Hands: Left wrist: postitive finklestein test.  Neurological: She is alert.  Skin: Skin is warm, dry and intact. No rash noted.  Psychiatric: Her speech is normal and behavior is normal. Judgment and thought content normal. Her mood appears not anxious. Cognition and memory are normal. She does not exhibit a depressed mood.          Assessment & Plan:

## 2013-09-09 NOTE — Assessment & Plan Note (Signed)
Improved on higher dose glucortol, but not at goal. Offered nutrition referral. She refused. Info given and reviewed about DM diet. Recheck at next OV.

## 2013-09-09 NOTE — Patient Instructions (Addendum)
Continue glucotrol at current dose. Work on low [employer], as you are able increase exercise and weight loss. Check blood sugar fasting  And 2 hours after a meal. Call if blood sugars increase back above 200 when restart  Prednisone. Continue elevating, ice, wear brace when on your feet. Start prednisone for antiinflammatory for hand and left wrist. Can use tramadol for pain. Follow up in 2 weeks for ankle sprain and wrist pain. Keep appt  For CPX.

## 2013-09-09 NOTE — Progress Notes (Signed)
Pre visit review using our clinic review tool, if applicable. No additional management support is needed unless otherwise documented below in the visit note. 

## 2013-09-09 NOTE — Assessment & Plan Note (Signed)
Likely bone bruis and tendonitis... Treat with NSAIds( she will be on prednsione) can use tramadol for pain.

## 2013-09-11 NOTE — Assessment & Plan Note (Signed)
Inadequate control since she has gained weight back and has been on prednisone off and on. Increase glucotrol to 10 mg and change to long acting for ease of use.

## 2013-09-11 NOTE — Assessment & Plan Note (Signed)
Well controlled on protonix.

## 2013-09-11 NOTE — Assessment & Plan Note (Signed)
Well controlled. Continue current medication.  

## 2013-09-11 NOTE — Assessment & Plan Note (Signed)
Stable control at last check in hospital

## 2013-09-11 NOTE — Assessment & Plan Note (Signed)
Well controlled on trazodone. 

## 2013-09-13 ENCOUNTER — Ambulatory Visit: Payer: BC Managed Care – PPO | Admitting: Pulmonary Disease

## 2013-09-22 ENCOUNTER — Ambulatory Visit (INDEPENDENT_AMBULATORY_CARE_PROVIDER_SITE_OTHER)
Admission: RE | Admit: 2013-09-22 | Discharge: 2013-09-22 | Disposition: A | Discharge status: Home / Self Care | Payer: BC Managed Care – PPO | Source: Ambulatory Visit | Attending: Family Medicine | Admitting: Family Medicine

## 2013-09-22 ENCOUNTER — Encounter: Payer: Self-pay | Admitting: Family Medicine

## 2013-09-22 ENCOUNTER — Ambulatory Visit (INDEPENDENT_AMBULATORY_CARE_PROVIDER_SITE_OTHER): Payer: BC Managed Care – PPO | Admitting: Family Medicine

## 2013-09-22 ENCOUNTER — Telehealth: Payer: Self-pay | Admitting: Family Medicine

## 2013-09-22 VITALS — BP 102/70 | HR 77 | Temp 98.4°F | Wt 240.0 lb

## 2013-09-22 DIAGNOSIS — M25532 Pain in left wrist: Secondary | ICD-10-CM

## 2013-09-22 DIAGNOSIS — M25539 Pain in unspecified wrist: Secondary | ICD-10-CM

## 2013-09-22 DIAGNOSIS — E119 Type 2 diabetes mellitus without complications: Secondary | ICD-10-CM

## 2013-09-22 DIAGNOSIS — S93409A Sprain of unspecified ligament of unspecified ankle, initial encounter: Secondary | ICD-10-CM

## 2013-09-22 DIAGNOSIS — S93401A Sprain of unspecified ligament of right ankle, initial encounter: Secondary | ICD-10-CM

## 2013-09-22 MED ORDER — ANKLE LACE-UP BRACE MISC: 1.0000 [IU] | Freq: Once | Status: DC

## 2013-09-22 NOTE — Progress Notes (Signed)
63 year old female presents for 2 week follow up.  At last OV: Type II or unspecified type diabetes mellitus without mention of complication, not stated as uncontrolled -   Improved on higher dose glucortol, but not at goal. Offered nutrition referral. She refused. Info given and reviewed about DM diet.  Lab Results  Component Value Date   HGBA1C 8.5* 08/09/2013     Left hand pain -   Likely bone bruise and tendonitis... Treat with nsaIds( she will be on prednsione) can use tramadol for pain.      Right ankle sprain -   RICE treatment. Air cast given.  Prednisone to be started (will be good antinflammatory.  if not improving consider X-ray at follow up in 2 weeks.    Today pt reports  She did not start the predniisone  as expected given weather.  Right ankle is better, some pain with walking but brace helps. 0-5/10  Left wrist continues to be tender. Using tyleol and iburpfen alternating.  Blood sugars have been doing well.Marland Kitchen FBS 117, 2 hr pp: 160-200

## 2013-09-22 NOTE — Telephone Encounter (Signed)
Ms. Witman notified as instructed by telephone.  Wrist Sprain Exercises mailed.

## 2013-09-22 NOTE — Patient Instructions (Signed)
Change over to a lace up ankle brace. Wear for 2 more week minimum when on feet. Then trial off, if pain continues wear longer. Continue home ROM exercises, progress as recommended. We will call with X-ray results. If no fracture... Wear wrist brace and prednisone will help as antiinflammatory. (Call if you still do not start Call if blood sugars increase with prednisone. Keep follow up as scheduled in April.

## 2013-09-22 NOTE — Telephone Encounter (Signed)
Notify pt X-ray negative for fracture. Wear wrist brace ( can get wrist brace at pharm.  Mail her wrist sprain exercises.

## 2013-09-22 NOTE — Assessment & Plan Note (Signed)
Continued swelling and now focal bony pain over lateral wrist. Send for Xray given likely osteoporosis.

## 2013-09-22 NOTE — Progress Notes (Signed)
Pre visit review using our clinic review tool, if applicable. No additional management support is needed unless otherwise documented below in the visit note. 

## 2013-09-22 NOTE — Assessment & Plan Note (Signed)
IMproving.. continue home PT and move to lace up brace.

## 2013-09-22 NOTE — Progress Notes (Signed)
Subjective:    Patient ID: Kaitlin Hamilton, female    DOB: Jan 10, 1951, 63 y.o.   MRN: [id number]  HPI At last OV: Type II or unspecified type diabetes mellitus without mention of complication, not stated as uncontrolled -   Improved on higher dose glucortol, but not at goal. Offered nutrition referral. She refused. Info given and reviewed about DM diet.  Lab Results  Component Value Date   HGBA1C 8.5* 08/09/2013     Left hand pain -   Likely bone bruise and tendonitis... Treat with nsaIds( she will be on prednsione) can use tramadol for pain.      Right ankle sprain -   RICE treatment. Air cast given.  Prednisone to be started (will be good antinflammatory.  if not improving consider X-ray at follow up in 2 weeks.    Today pt reports  She did not start the predniisone  as expected given weather.  Right ankle is better, some pain with walking but brace helps. 0-5/10  Left wrist continues to be tender. Using tyleol and iburpfen alternating.  Blood sugars have been doing well.Marland Kitchen FBS 117, 2 hr pp: 160-200     Review of Systems  Constitutional: Negative for fever and fatigue.  HENT: Negative for ear pain.   Eyes: Negative for pain.  Respiratory: Negative for shortness of breath.   Cardiovascular: Negative for chest pain.       Objective:   Physical Exam  Constitutional: Vital signs are normal. She appears well-developed and well-nourished. She is cooperative.  Non-toxic appearance. She does not appear ill. No distress.  Obese appearing female in NAD  HENT:  Head: Normocephalic.  Right Ear: Hearing, tympanic membrane, external ear and ear canal normal. Tympanic membrane is not erythematous, not retracted and not bulging.  Left Ear: Hearing, tympanic membrane, external ear and ear canal normal. Tympanic membrane is not erythematous, not retracted and not bulging.  Nose: No mucosal edema or rhinorrhea. Right sinus exhibits no maxillary sinus tenderness and no frontal sinus  tenderness. Left sinus exhibits no maxillary sinus tenderness and no frontal sinus tenderness.  Mouth/Throat: Uvula is midline, oropharynx is clear and moist and mucous membranes are normal.  Eyes: Conjunctivae, EOM and lids are normal. Pupils are equal, round, and reactive to light. Lids are everted and swept, no foreign bodies found.  Neck: Trachea normal and normal range of motion. Neck supple. Carotid bruit is not present. No mass and no thyromegaly present.  Cardiovascular: Normal rate, regular rhythm, S1 normal, S2 normal, normal heart sounds, intact distal pulses and normal pulses.  Exam reveals no gallop and no friction rub.   No murmur heard. Pulmonary/Chest: Effort normal and breath sounds normal. Not tachypneic. No respiratory distress. She has no decreased breath sounds. She has no wheezes. She has no rhonchi. She has no rales.  Abdominal: Soft. Normal appearance and bowel sounds are normal. There is no tenderness.  Musculoskeletal:       Left wrist: She exhibits decreased range of motion, tenderness and bony tenderness.       Right ankle: She exhibits normal range of motion and no swelling. Tenderness. Lateral malleolus tenderness found. No head of 5th metatarsal and no proximal fibula tenderness found.       Right hand: She exhibits normal range of motion, no tenderness and no bony tenderness.       Hands: Left wrist: postitive finklestein test.  Neurological: She is alert.  Skin: Skin is warm, dry and intact. No rash  noted.  Psychiatric: Her speech is normal and behavior is normal. Judgment and thought content normal. Her mood appears not anxious. Cognition and memory are normal. She does not exhibit a depressed mood.          Assessment & Plan:

## 2013-09-22 NOTE — Assessment & Plan Note (Signed)
Improved control. Continue current meds. Encouraged exercise, weight loss, healthy eating habits.

## 2013-09-23 ENCOUNTER — Telehealth: Payer: Self-pay | Admitting: Pulmonary Disease

## 2013-09-23 ENCOUNTER — Ambulatory Visit (INDEPENDENT_AMBULATORY_CARE_PROVIDER_SITE_OTHER): Payer: BC Managed Care – PPO | Admitting: Pulmonary Disease

## 2013-09-23 ENCOUNTER — Ambulatory Visit: Payer: BC Managed Care – PPO | Admitting: Family Medicine

## 2013-09-23 ENCOUNTER — Encounter: Payer: Self-pay | Admitting: Pulmonary Disease

## 2013-09-23 VITALS — BP 132/78 | HR 80 | Temp 98.1°F | Ht 66.0 in | Wt 244.0 lb

## 2013-09-23 DIAGNOSIS — J84112 Idiopathic pulmonary fibrosis: Secondary | ICD-10-CM

## 2013-09-23 DIAGNOSIS — J069 Acute upper respiratory infection, unspecified: Secondary | ICD-10-CM

## 2013-09-23 DIAGNOSIS — G9341 Metabolic encephalopathy: Secondary | ICD-10-CM

## 2013-09-23 DIAGNOSIS — G4733 Obstructive sleep apnea (adult) (pediatric): Secondary | ICD-10-CM

## 2013-09-23 DIAGNOSIS — J8410 Pulmonary fibrosis, unspecified: Secondary | ICD-10-CM

## 2013-09-23 DIAGNOSIS — R1013 Epigastric pain: Secondary | ICD-10-CM

## 2013-09-23 MED ORDER — MYCOPHENOLATE MOFETIL 500 MG PO TABS: 500.0000 mg | ORAL_TABLET | Freq: Two times a day (BID) | ORAL | Status: DC

## 2013-09-23 MED ORDER — PREDNISONE 10 MG PO TABS: 10.0000 mg | ORAL_TABLET | Freq: Every day | ORAL | Status: DC

## 2013-09-23 MED ORDER — SULFAMETHOXAZOLE-TRIMETHOPRIM 400-80 MG PO TABS
ORAL_TABLET | ORAL | Status: DC
Start: 2013-09-23 — End: 2013-10-12

## 2013-09-23 MED ORDER — TRAZODONE HCL 50 MG PO TABS: 50.0000 mg | ORAL_TABLET | Freq: Every day | ORAL | Status: DC

## 2013-09-23 NOTE — Telephone Encounter (Signed)
[facility] added to pt's pharmacy list.  Updated Cellcept rx sent in to pharmacy.  Nothing further needed.

## 2013-09-23 NOTE — Progress Notes (Signed)
Subjective:    Patient ID: Kaitlin Hamilton, female    DOB: [birth year], 63 y.o.   MRN: [id number]  Synopsis: Kaitlin Hamilton is a very pleasant 63 year old female who first saw the [facility] pulmonary clinic in March 2014 for evaluation of shortness of breath. She had significant cough, wheezing, and sputum production requiring multiple rounds of antibiotics. Because of crackles on lung exam and an abnormal pulmonary function test she was referred to [place]. We ordered full pulmonary function testing which showed moderate restriction and a depressed DLCO in proportion to her restriction. There is no airflow obstruction and no change with bronchodilator administration. A chest x-ray performed in February 2014 was read as normal.  HPI   11/16/2012 ROV -- Kaitlin Hamilton feels that her cough and dyspnea is better since our last visit.  She is still taking Advair twice a day and rarely has to use her albuterol inhaler. She did use albuterol over the weekend when she was cleaning out a closet that was full of a lot of dust and mold. This made her short of breath and had chest congestion. The albuterol helped significantly. She stated that when she use the albuterol for the pulmonary function test yesterday she felt significant improvement in her breathing. Otherwise she is doing very well and has no symptoms.  04/19/2013 ROV -- Kaitlin Hamilton has had a rough time since her last visit. Unfortunately she never had a blood work done nor the barium swallow which we recommended last time. Approximately one month ago she developed a cough with some sputum production. She was treated with Augmentin as well as prednisone. She was also started on a Dulera inhaler at some point in the last month. She said that the prednisone deathly made her feel better but then her cough returned not long after she stopped that medicine. She has had some shortness of breath with this lately. The [facility] has not made any difference that she can tell. However she  does feel that the [facility] is making her blood sugar elevated.  She has been producing green sputum lately, particularly in the last three days.  05/17/2013 ROV > Kaitlin Hamilton feels like her breathing is better since the last visit.  She has been having ear pain for about 2-3 days in R ear. She has also noted some sinus symptoms since the last visit.   She continues to have a cough at night, but it is better overall and in the morning . When she bends over she has more cough.  She wonders if it is the carpet in her room. She typically works in the same room, but working in other rooms doesn't seem to make a difference in those.  Dyspnea has improved somewhat.    06/24/2013 ROV > Kaitlin Hamilton has had increased dyspnea, cough, and green mucus production ever since stopping the prednisone last week. No fevers or chills.  The dyspnea is mild.  She has not had chest pain or body aches, rash, or leg swelling.  No weight change.    She had an endoscopy which showed a Schatzki ring which was dilated by Dr. Hilarie Fredrickson.  She also had gastropathy which was H.Pylori positive.  07/27/2012 ROV >> Since surgery, she has only had two or three nights of good sleep.  The prednisone is really interfering with her sleep.  She is on a bad sleep cycle.  Last week she slept seven hours. Her family notes that she snores.    She is still having some dyspnea,  typically it is better on days when she gets more sleep. She has had some problem with dyspnea, not clear if worse since before surery or not.  She has had a hacking cough, but it has been mostly dry for the last few days.    She is taking protonix.  07/29/2013 Acute Wert> rash, bactrim held  08/01/2013 Acute >> Since our last visit Kaitlin Hamilton started Cellcept and Bactrim the following day and she started a rash with hives.  She saw Wert and he stopped the bactrim.  They noted that she took two cellcept the first day.   Her hives and rash were getting worse over the weekend but it has subtly  improved.. The biggest problem at this point is confusion.  She really hasn't slept much.  She has had a lot of abdominal pain over the last few nights with nausea and vomiting.  The pain is epigastric.  She has been taking her flagyl lately without improvement.  Her c.diff toxin assay was negative.  She has not had significant respiratory problems. The noted that they took too much cellcept on 1/8 (they took $RemoveBe'500mg'mYXfIHRqS$  bid instead of daily).  08/25/2013 HFU > She feels that her stomach is better since leaving the hospital.  For some reason she started taking the atovaquone for four days after leaving the hospital.  She had a rash and itching after this, so she stopped.  She continues to take the cellcept at $RemoveBef'500mg'FHVdKHBLpj$  daily.  She has been some short of breath and has had some green mucus production.  She has had some tightness in her chest.  She is sleeping with her oxygen at night. Overall her energy level is much better and she is doing well.    09/23/2013 ROV >> Kaitlin Hamilton has been having more sinus trouble lately which bothered her some.  She has a cough sometimes productive of yellow or green sputum.  Definitely less sputum than before surgery.  She has noticed her oxygen level dropping a little with exertion.  She was able to do laundry earlier in the week.  She really didn't feel too short of breath with this, just exhausted.   She fell a few weeks back when she went to the [place] with her friend Dorian Pod.  She sprained her ankle and wrist. She has recently been stared on an extended release glipizide which gave her itching and a rash. She has been using her CPAP machine.  She does better with pressure between 14-15; above this she gets a significant leak.     Past Medical History  Diagnosis Date  . Bronchitis   . Chicken pox   . Depression   . Allergic rhinitis     uses Flonase daily  . Hyperlipidemia     lost 43 pounds and no meds required at present  . Hypokalemia   . Anxiety   . PONV (postoperative  nausea and vomiting)   . Hypertension     takes Verapamil and HCTZ daily  . Shortness of breath     with exertion;takes Singulair daily as well as Flonase  . Pneumonia     last time in 2006  . History of bronchitis     2013  . H/O hiatal hernia   . Migraines     last one about a month ago  . Dysphagia   . Joint swelling     left thumb  . OA (osteoarthritis)     left knee  . GERD (gastroesophageal reflux disease)  takes Protonix daily  . Hemorrhoids   . History of colon polyps   . Diverticulosis   . Urinary frequency   . History of kidney stones   . Non-insulin dependent type 2 diabetes mellitus     takes Glipizide daily  . Constipation     takes Fiber daily  . Hypothyroidism (acquired)     takes Synthroid daily  . Insomnia     doesn't take any meds for this        Review of Systems  Constitutional: Negative for fever, chills, activity change and fatigue.  HENT: Positive for postnasal drip, rhinorrhea and sinus pressure. Negative for congestion, ear pain and nosebleeds.   Respiratory: Positive for cough. Negative for shortness of breath and wheezing.   Cardiovascular: Negative for chest pain, palpitations and leg swelling.  Gastrointestinal: Negative for nausea and abdominal pain.       Objective:   Physical Exam   Filed Vitals:   09/23/13 1053  BP: 132/78  Pulse: 80  Temp: 98.1 F (36.7 C)  TempSrc: Oral  Height: $Remove'5\' 6"'fzZxMiX$  (1.676 m)  Weight: 244 lb (110.678 kg)  SpO2: 97%  Room Air  Gen:  well appearing HEENT: NCAT,  EOMi, OP clear,  PULM: Crackles 1/3 way up bilaterally CV: RRR, no mgr, no JVD AB: BS+, soft, mild epigastric tenderness, no hsm Ext: warm, left wrist and R ankle in brace Neuro: A&Ox4, maew  February 2014 simple spirometry performed by her primary care physician>> ratio 90%, FEV1 1.71 L (64% predicted, FVC 1.83 L 55% predicted; flow volume loop is not consistent with obstruction February 2014 chest [facility] at [facility] normal 10/19/2012  walked 500 feet in office on room air oxygenation did not drop below 90% 11/15/2012 Full PFT [name]> Ratio 87%, FEV1 2.00L > 2.07 with bronchodilator (3% change); TLC 3.44 L (65% pred), ERV 0.62 (57% pred), DLCO 15.1 ml/mmHg/min (59% pred) 11/2012 CT chest >> ([facility] read) centrilobular nodules, interlobular septal thickening worse in bases and periphery R lung > L; some GGO in bases and periphery as well, some bronchiectasis in the bases R > L; findings suggestive of fibrosis but not UIP; question hypersensitivity pneumonitis given centrilobular nodules; also question aspiration 03/2013 ANA, ANCA, Anti-Jo-1, ESR, RF, SCL-70, anti-centromere, SSA/SSB, all negative; CRP 0.6;  04/2013 Barium swallow> abnormal esophageal motility, GERD, hiatal hernia 04/2013 Full PFT> Ratio 93%, FEV1 1.92 L (71% pred), TLC 3.02L (56% pred), DLCO 15.94 (59% pred) 04/2013 CT chest ([name])> findings suggestive of but not diagnostic of NSIP, small pulmonary nodule 04/2013 6MW RA > 1100 feet, HR peak 109, O2 sat Nadir 87% 04/2013 HP panel >> 05/17/2013 3 month prednisone trial 06/2013 open lung biopsy> UIP 07/28/13 Cell cept and bactrim started > bactrim stopped 07/29/13 due to rash  07/2013 hospitalized for confusion > prednisone 07/2013 hospitalized for gastritis/abdominal pain> prednisone 08/22/2013 atovaquone caused rash      Assessment & Plan:   OSA (obstructive sleep apnea) We will adjust her CPAP pressure to no higher than 15cm h20 because she notes that she gets a significant air leak that bothers her above this level.  Otherwise her compliance has been excellent.  UIP (usual interstitial pneumonitis) This has been a stable interval for [facility] with her steroid responsive UIP.  Her oxygen saturation today on a 500 foot walk was no different than in April 2014 (dropped to 89% today), so I am happy about that.  Further, she has not had worsening symptoms.  Plan: -we are going to  increase cellcept to $RemoveBef'500mg'tUXBUpGBzo$  po  bid -start prednisone $RemoveBeforeDEI'10mg'HquAwHvvsQlrwHjd$  daily  -we will restart bactrim SS daily for PCP prophylaxis; Today we discussed several options, she's not really sure the bactrim caused her rash back in January; if she develops a rash then we will change to dapsone $RemoveBe'100mg'iWEKBAyDV$  daily -f/u 4-6 weeks -cbc, cmet before next visit   URI (upper respiratory infection) Mild sinus cold; given duration of symptoms (over one week) will treat with bactrim SS bid for 5 days, then go to [facility] daily for PCP prophylaxis    Updated Medication List Outpatient Encounter Prescriptions as of 09/23/2013  Medication Sig  . acetaminophen (TYLENOL) 500 MG tablet Take 1,000 mg by mouth every 6 (six) hours as needed for moderate pain.  . diphenhydrAMINE (BENADRYL) 25 mg capsule Take 25 mg by mouth as needed.  . Elastic Bandages & Supports (ANKLE LACE-UP BRACE) MISC 1 Units by Does not apply route once.  Marland Kitchen FIBER PO Take 1 tablet by mouth daily.  . fluticasone (FLONASE) 50 MCG/ACT nasal spray Place 2 sprays into the nose daily.   Marland Kitchen glipiZIDE (GLUCOTROL) 10 MG tablet Take 1 tablet (10 mg total) by mouth 2 (two) times daily before a meal.  . Homeopathic Products (CHESTAL HONEY COUGH) SYRP Take 5 mLs by mouth every other day as needed.  . hydrochlorothiazide (MICROZIDE) 12.5 MG capsule Take 12.5 mg by mouth daily.  . Hydrocodone-Chlorpheniramine 5-4 MG/5ML SOLN One teaspoonful every 12 hours if needed for cough  . hydrocortisone cream 0.5 % Apply 1 application topically 2 (two) times daily as needed (for rash).  Marland Kitchen levothyroxine (SYNTHROID, LEVOTHROID) 125 MCG tablet Take 125 mcg by mouth daily before breakfast.  . montelukast (SINGULAIR) 10 MG tablet Take 10 mg by mouth at bedtime.  . mycophenolate (CELLCEPT) 500 MG tablet Take 500 mg by mouth 2 (two) times daily.  . pantoprazole (PROTONIX) 40 MG tablet Take 1 tablet (40 mg total) by mouth 2 (two) times daily.  . Probiotic Product (PROBIOTIC DAILY PO) Take 2 capsules by mouth 2 (two) times daily.  .  traMADol-acetaminophen (ULTRACET) 37.5-325 MG per tablet Take 1 tablet by mouth every 6 (six) hours as needed.  . traZODone (DESYREL) 50 MG tablet Take 1 tablet (50 mg total) by mouth at bedtime.  . verapamil (VERELAN PM) 180 MG 24 hr capsule Take 180 mg by mouth daily.  . predniSONE (DELTASONE) 10 MG tablet Take 10 mg by mouth daily with breakfast.

## 2013-09-23 NOTE — Patient Instructions (Signed)
Take the bactrim twice a day for 5 days, then decrease to daily Start prednisone 10mg  daily After you complete the 5 day course of bactrim, increase cellcept to 500mg  twice a day We will change your CPAP to go no higher than 15cm H20 We will see you back in 4-6 weeks or sooner if needed

## 2013-09-23 NOTE — Telephone Encounter (Signed)
rx refilled. Pt aware.  Nothing further needed.

## 2013-09-23 NOTE — Telephone Encounter (Signed)
Spoke with Dorian Pod. Cellcept needs to sent to [facility]. This has been done. Nothing further is needed.

## 2013-09-24 NOTE — Assessment & Plan Note (Signed)
We will adjust her CPAP pressure to no higher than 15cm h20 because she notes that she gets a significant air leak that bothers her above this level.  Otherwise her compliance has been excellent.

## 2013-09-24 NOTE — Assessment & Plan Note (Signed)
Mild sinus cold; given duration of symptoms (over one week) will treat with bactrim SS bid for 5 days, then go to [facility] daily for PCP prophylaxis

## 2013-09-24 NOTE — Assessment & Plan Note (Signed)
This has been a stable interval for Kaitlin Hamilton with her steroid responsive UIP.  Her oxygen saturation today on a 500 foot walk was no different than in April 2014 (dropped to 89% today), so I am happy about that.  Further, she has not had worsening symptoms.  Plan: -we are going to increase cellcept to 500mg  po bid -start prednisone 10mg  daily  -we will restart bactrim SS daily for PCP prophylaxis; Today we discussed several options, she's not really sure the bactrim caused her rash back in January; if she develops a rash then we will change to dapsone 100mg  daily -f/u 4-6 weeks -cbc, cmet before next visit

## 2013-09-26 ENCOUNTER — Telehealth: Payer: Self-pay | Admitting: Pulmonary Disease

## 2013-09-26 DIAGNOSIS — G4733 Obstructive sleep apnea (adult) (pediatric): Secondary | ICD-10-CM

## 2013-09-26 NOTE — Telephone Encounter (Signed)
Pt wanted to let us know that she has taken the [facility] prescription that BQ gave her and she has had a reaction. She had taken this in the past and BQ wanted her to try it one more time. Rash and "welps" under her breasts were visible this AM. She has since stopped the prescription and has started new abx that was given to her. Advised her to use benadryl to help with rash. Also aware that we have placed the order for her CPAP pressure. Nothing further was needed.

## 2013-09-26 NOTE — Telephone Encounter (Signed)
5-15cm h20 

## 2013-09-26 NOTE — Telephone Encounter (Signed)
lmtcb x1 

## 2013-09-26 NOTE — Telephone Encounter (Signed)
Message copied by Len Blalock on Mon Sep 26, 2013 10:13 AM ------      Message from: Kaitlin Hamilton      Created: Sat Sep 24, 2013  8:43 AM       A,      Can't remember if I asked this on Friday, but we need to change the autotitrating settings on her CPAP to go no higher than 15cm H20.      Thanks      B ------

## 2013-09-26 NOTE — Telephone Encounter (Signed)
Order has been placed to DME.  Nothing further needed at this time.

## 2013-09-26 NOTE — Telephone Encounter (Signed)
Please advise Dr. [name] where pt should start on auto titration? thanks

## 2013-09-27 ENCOUNTER — Telehealth: Payer: Self-pay | Admitting: Pulmonary Disease

## 2013-09-27 NOTE — Telephone Encounter (Signed)
Called and spoke with pt. She reports she will finish ABX Friday for doxy 100 mg 1 po BID x 5 days. She was originally suppose to take bactrim but had an allergic reaction. She wants to know if she is suppose to continue on the doxy and decrease to QD after 5 days like she was too on the bactrim. Please advise BQ thanks

## 2013-09-27 NOTE — Telephone Encounter (Signed)
Staff message will be sent to [facility].

## 2013-09-28 MED ORDER — DAPSONE 100 MG PO TABS: 100.0000 mg | ORAL_TABLET | Freq: Every day | ORAL | Status: DC

## 2013-09-28 NOTE — Telephone Encounter (Signed)
No She should not take the doxy She needs to take dapsone 100mg  po daily after completing the doxy Please Rx dapsone 100mg  po daily disp 30, refill 3

## 2013-09-28 NOTE — Telephone Encounter (Signed)
Spoke with pt. Aware of recs. She will try the dapsone out and see how she does. rx sent

## 2013-10-07 ENCOUNTER — Telehealth: Payer: Self-pay

## 2013-10-07 ENCOUNTER — Encounter: Payer: Self-pay | Admitting: Pulmonary Disease

## 2013-10-07 NOTE — Telephone Encounter (Signed)
Pt aware of results.  Nothing further needed.  

## 2013-10-07 NOTE — Telephone Encounter (Signed)
Message copied by Len Blalock on Fri Oct 07, 2013 12:34 PM ------      Message from: Juanito Doom      Created: Fri Oct 07, 2013  5:57 AM       A,            Please let her know that her compliance report looked great.            Thanks      B ------

## 2013-10-11 ENCOUNTER — Telehealth: Payer: Self-pay | Admitting: Pulmonary Disease

## 2013-10-11 NOTE — Telephone Encounter (Signed)
Per SN---  Need to keep the area dry----keep the skin off of skin  Call in mycostatin cream  #1  Tube  Apply to are bid   Call Dr. [name] with an update in 2-3 days.  thanks

## 2013-10-11 NOTE — Telephone Encounter (Signed)
Spoke w/ pt. She reports she has welps under both of her breasts this AM like she did when she took the bactrim. She has been on dapsone, prednisone, and cellcept x2 weeks now. She wants to know if any of these could be causing this? BQ is not on the schedule this afternoon. Will forward to DOD. Please advise SN thanks  Allergies  Allergen Reactions  . Atovaquone Itching  . Codeine Hives and Swelling  . Demerol [Meperidine] Hives and Swelling  . Hydrocodone Nausea Only  . Sulfa Antibiotics Rash

## 2013-10-11 NOTE — Telephone Encounter (Signed)
Called, spoke with pt.  Informed her of below recs per Dr. Lenna Gilford.  She verbalized understanding of this.  Pt states this rash looks just like th reaction she had to Bactrim except it's under and around her breasts.  No itching.  Pt also states her "breathing is not exactly right."  Report o2 sat has been 89% in the mornings after getting up and being on cpap all night long.  She also feels SOB when walking to the bathroom.  Pt is requesting to hold off on the below recs per SN, would like to cont with the benadryl and cortizone 10 cream she is using for now, and is requesting to come into [place] office tomorrow to see Dr. [name] to discuss this rash and breathing.  We have scheduled her to see BQ tomorrow at 1:45 in [place].  Pt aware and voiced no further questions or concerns at this time.  She is to call back with any further questions or concerns prior to OV.

## 2013-10-12 ENCOUNTER — Ambulatory Visit (INDEPENDENT_AMBULATORY_CARE_PROVIDER_SITE_OTHER): Payer: BC Managed Care – PPO | Admitting: Pulmonary Disease

## 2013-10-12 ENCOUNTER — Encounter: Payer: Self-pay | Admitting: Pulmonary Disease

## 2013-10-12 VITALS — BP 142/84 | HR 85 | Ht 66.0 in | Wt 241.0 lb

## 2013-10-12 DIAGNOSIS — R21 Rash and other nonspecific skin eruption: Secondary | ICD-10-CM

## 2013-10-12 DIAGNOSIS — J84112 Idiopathic pulmonary fibrosis: Secondary | ICD-10-CM

## 2013-10-12 DIAGNOSIS — J8410 Pulmonary fibrosis, unspecified: Secondary | ICD-10-CM

## 2013-10-12 MED ORDER — AZELASTINE-FLUTICASONE 137-50 MCG/ACT NA SUSP
NASAL | Status: DC
Start: 2013-10-12 — End: 2014-10-18

## 2013-10-12 NOTE — Assessment & Plan Note (Signed)
This problem has resolved. Given that she has been taking the Dapsone for 14 days and doesn't have the rash today, it seems less likely that this is causing the problem.  Plan: -monitor  -notify us if recurs -continue dapsone

## 2013-10-12 NOTE — Progress Notes (Signed)
Subjective:    Patient ID: Kaitlin Hamilton, female    DOB: [birth year], 63 y.o.   MRN: [id number]  Synopsis: Kaitlin Hamilton is a very pleasant 63 year old female who first saw the [facility] pulmonary clinic in March 2014 for evaluation of shortness of breath. She had significant cough, wheezing, and sputum production requiring multiple rounds of antibiotics. Because of crackles on lung exam and an abnormal pulmonary function test she was referred to [place]. We ordered full pulmonary function testing which showed moderate restriction and a depressed DLCO in proportion to her restriction. There is no airflow obstruction and no change with bronchodilator administration. A chest x-ray performed in February 2014 was read as normal.  HPI   11/16/2012 ROV -- Kaitlin Hamilton feels that her cough and dyspnea is better since our last visit.  She is still taking Advair twice a day and rarely has to use her albuterol inhaler. She did use albuterol over the weekend when she was cleaning out a closet that was full of a lot of dust and mold. This made her short of breath and had chest congestion. The albuterol helped significantly. She stated that when she use the albuterol for the pulmonary function test yesterday she felt significant improvement in her breathing. Otherwise she is doing very well and has no symptoms.  04/19/2013 ROV -- Kaitlin Hamilton has had a rough time since her last visit. Unfortunately she never had a blood work done nor the barium swallow which we recommended last time. Approximately one month ago she developed a cough with some sputum production. She was treated with Augmentin as well as prednisone. She was also started on a Dulera inhaler at some point in the last month. She said that the prednisone deathly made her feel better but then her cough returned not long after she stopped that medicine. She has had some shortness of breath with this lately. The [facility] has not made any difference that she can tell. However she  does feel that the [facility] is making her blood sugar elevated.  She has been producing green sputum lately, particularly in the last three days.  05/17/2013 ROV > Kaitlin Hamilton feels like her breathing is better since the last visit.  She has been having ear pain for about 2-3 days in R ear. She has also noted some sinus symptoms since the last visit.   She continues to have a cough at night, but it is better overall and in the morning . When she bends over she has more cough.  She wonders if it is the carpet in her room. She typically works in the same room, but working in other rooms doesn't seem to make a difference in those.  Dyspnea has improved somewhat.    06/24/2013 ROV > Kaitlin Hamilton has had increased dyspnea, cough, and green mucus production ever since stopping the prednisone last week. No fevers or chills.  The dyspnea is mild.  She has not had chest pain or body aches, rash, or leg swelling.  No weight change.    She had an endoscopy which showed a Schatzki ring which was dilated by Dr. Hilarie Hamilton.  She also had gastropathy which was H.Pylori positive.  07/27/2012 ROV >> Since surgery, she has only had two or three nights of good sleep.  The prednisone is really interfering with her sleep.  She is on a bad sleep cycle.  Last week she slept seven hours. Her family notes that she snores.    She is still having some dyspnea,  typically it is better on days when she gets more sleep. She has had some problem with dyspnea, not clear if worse since before surery or not.  She has had a hacking cough, but it has been mostly dry for the last few days.    She is taking protonix.  07/29/2013 Acute Kaitlin Hamilton> rash, bactrim held  08/01/2013 Acute >> Since our last visit Kaitlin Hamilton started Cellcept and Bactrim the following day and she started a rash with hives.  She saw Kaitlin Hamilton and he stopped the bactrim.  They noted that she took two cellcept the first day.   Her hives and rash were getting worse over the weekend but it has subtly  improved.. The biggest problem at this point is confusion.  She really hasn't slept much.  She has had a lot of abdominal pain over the last few nights with nausea and vomiting.  The pain is epigastric.  She has been taking her flagyl lately without improvement.  Her c.diff toxin assay was negative.  She has not had significant respiratory problems. The noted that they took too much cellcept on 1/8 (they took 537m bid instead of daily).  08/25/2013 HFU > She feels that her stomach is better since leaving the hospital.  For some reason she started taking the atovaquone for four days after leaving the hospital.  She had a rash and itching after this, so she stopped.  She continues to take the cellcept at 5050mdaily.  She has been some short of breath and has had some green mucus production.  She has had some tightness in her chest.  She is sleeping with her oxygen at night. Overall her energy level is much better and she is doing well.    09/23/2013 ROV >> Kaitlin Hamilton been having more sinus trouble lately which bothered her some.  She has a cough sometimes productive of yellow or green sputum.  Definitely less sputum than before surgery.  She has noticed her oxygen level dropping a little with exertion.  She was able to do laundry earlier in the week.  She really didn't feel too short of breath with this, just exhausted.   She fell a few weeks back when she went to the [place] with her friend Kaitlin Hamilton She sprained her ankle and wrist. She has recently been stared on an extended release glipizide which gave her itching and a rash. She has been using her CPAP machine.  She does better with pressure between 14-15; above this she gets a significant leak.    10/12/2013 ROV>> Kaitlin Hamilton a rash under her breast recently which was red, raised and itching.  It occurred yesterday alone.  The only thing she can think of is that she had some poppy seeds this week.  She has been taking the Dapsone since 3/11.    She has been  having a lot of sinus congetion and headaches, but no fevers or chills.  She has a cough still.  She thinks that her breathing is a little worse in that she is giving out sooner rather than later.  She has been watching her oxygen level closely and it is dropping occasionally into the 80's.     Past Medical History  Diagnosis Date  . Bronchitis   . Chicken pox   . Depression   . Allergic rhinitis     uses Flonase daily  . Hyperlipidemia     lost 43 pounds and no meds required at present  . Hypokalemia   .  Anxiety   . PONV (postoperative nausea and vomiting)   . Hypertension     takes Verapamil and HCTZ daily  . Shortness of breath     with exertion;takes Singulair daily as well as Flonase  . Pneumonia     last time in 2006  . History of bronchitis     2013  . H/O hiatal hernia   . Migraines     last one about a month ago  . Dysphagia   . Joint swelling     left thumb  . OA (osteoarthritis)     left knee  . GERD (gastroesophageal reflux disease)     takes Protonix daily  . Hemorrhoids   . History of colon polyps   . Diverticulosis   . Urinary frequency   . History of kidney stones   . Non-insulin dependent type 2 diabetes mellitus     takes Glipizide daily  . Constipation     takes Fiber daily  . Hypothyroidism (acquired)     takes Synthroid daily  . Insomnia     doesn't take any meds for this        Review of Systems  Constitutional: Negative for fever, chills, activity change and fatigue.  HENT: Positive for postnasal drip, rhinorrhea and sinus pressure. Negative for congestion, ear pain and nosebleeds.   Respiratory: Positive for cough. Negative for shortness of breath and wheezing.   Cardiovascular: Negative for chest pain, palpitations and leg swelling.  Gastrointestinal: Negative for nausea and abdominal pain.       Objective:   Physical Exam   Filed Vitals:   10/12/13 1340  BP: 142/84  Pulse: 85  Height: _0  (1.676 m)  Weight: 241 lb  (109.317 kg)  SpO2: 92%  Room Air  Gen:  anxious HEENT: NCAT,  EOMi, OP clear,  PULM: Crackles 1/3 way up bilaterally CV: RRR, no mgr, no JVD AB: BS+, soft, mild epigastric tenderness, no hsm Ext: warm, left wrist and R ankle in brace Neuro: A&Ox4, maew  February 2014 simple spirometry performed by her primary care physician>> ratio 90%, FEV1 1.71 L (64% predicted, FVC 1.83 L 55% predicted; flow volume loop is not consistent with obstruction February 2014 [facility] normal 10/19/2012 walked 500 feet in office on room air oxygenation did not drop below 90% 11/15/2012 Full PFT [name]> Ratio 87%, FEV1 2.00L > 2.07 with bronchodilator (3% change); TLC 3.44 L (65% pred), ERV 0.62 (57% pred), DLCO 15.1 ml/mmHg/min (59% pred) 11/2012 CT chest >> (Kaitlin Hamilton read) centrilobular nodules, interlobular septal thickening worse in bases and periphery R lung > L; some GGO in bases and periphery as well, some bronchiectasis in the bases R > L; findings suggestive of fibrosis but not UIP; question hypersensitivity pneumonitis given centrilobular nodules; also question aspiration 03/2013 ANA, ANCA, Anti-Jo-1, ESR, RF, SCL-70, anti-centromere, SSA/SSB, all negative; CRP 0.6;  04/2013 Barium swallow> abnormal esophageal motility, GERD, hiatal hernia 04/2013 Full PFT> Ratio 93%, FEV1 1.92 L (71% pred), TLC 3.02L (56% pred), DLCO 15.94 (59% pred) 04/2013 CT chest (Kaitlin Hamilton)> findings suggestive of but not diagnostic of NSIP, small pulmonary nodule 04/2013 6MW RA > 1100 feet, HR peak 109, O2 sat Nadir 87% 04/2013 HP panel >> 05/17/2013 3 month prednisone trial 06/2013 open lung biopsy> UIP 07/28/13 Cell cept and bactrim started > bactrim stopped 07/29/13 due to rash  07/2013 hospitalized for confusion > prednisone 07/2013 hospitalized for gastritis/abdominal pain> prednisone 08/22/2013 atovaquone caused rash  Assessment & Plan:   Rash and nonspecific skin eruption This problem has resolved. Given that  she has been taking the Dapsone for 14 days and doesn't have the rash today, it seems less likely that this is causing the problem.  Plan: -monitor  -notify us if recurs -continue dapsone  UIP (usual interstitial pneumonitis) We will check a 6 min walk today Continue cellcept, prednisone, dapsone Will increase cellcept next visit    Updated Medication List Outpatient Encounter Prescriptions as of 10/12/2013  Medication Sig  . acetaminophen (TYLENOL) 500 MG tablet Take 1,000 mg by mouth every 6 (six) hours as needed for moderate pain.  . dapsone 100 MG tablet Take 1 tablet (100 mg total) by mouth daily.  . diphenhydrAMINE (BENADRYL) 25 mg capsule Take 25 mg by mouth as needed.  . Elastic Bandages & Supports (ANKLE LACE-UP BRACE) MISC 1 Units by Does not apply route once.  Marland Kitchen FIBER PO Take 1 tablet by mouth daily.  . fluticasone (FLONASE) 50 MCG/ACT nasal spray Place 2 sprays into the nose daily.   Marland Kitchen glipiZIDE (GLUCOTROL) 10 MG tablet Take 1 tablet (10 mg total) by mouth 2 (two) times daily before a meal.  . hydrochlorothiazide (MICROZIDE) 12.5 MG capsule Take 12.5 mg by mouth daily.  . hydrocortisone cream 0.5 % Apply 1 application topically 2 (two) times daily as needed (for rash).  Marland Kitchen levothyroxine (SYNTHROID, LEVOTHROID) 125 MCG tablet Take 125 mcg by mouth daily before breakfast.  . montelukast (SINGULAIR) 10 MG tablet Take 10 mg by mouth at bedtime.  . mycophenolate (CELLCEPT) 500 MG tablet Take 1 tablet (500 mg total) by mouth 2 (two) times daily.  . pantoprazole (PROTONIX) 40 MG tablet Take 1 tablet (40 mg total) by mouth 2 (two) times daily.  . predniSONE (DELTASONE) 10 MG tablet Take 1 tablet (10 mg total) by mouth daily with breakfast.  . Probiotic Product (PROBIOTIC DAILY PO) Take 2 capsules by mouth 2 (two) times daily.  . traMADol-acetaminophen (ULTRACET) 37.5-325 MG per tablet Take 1 tablet by mouth every 6 (six) hours as needed.  . verapamil (VERELAN PM) 180 MG 24 hr  capsule Take 180 mg by mouth daily.  . traZODone (DESYREL) 50 MG tablet Take 1 tablet (50 mg total) by mouth at bedtime.  . [DISCONTINUED] Homeopathic Products (CHESTAL HONEY COUGH) SYRP Take 5 mLs by mouth every other day as needed.  . [DISCONTINUED] Hydrocodone-Chlorpheniramine 5-4 MG/5ML SOLN One teaspoonful every 12 hours if needed for cough  . [DISCONTINUED] sulfamethoxazole-trimethoprim (BACTRIM) 400-80 MG per tablet Take one tablet twice a day for five days, then take daily starting after 3/11

## 2013-10-12 NOTE — Patient Instructions (Signed)
We will arrange a pulmonary function test in [place] Stop flonase Use dymista one puff each nostril twice per day Continue your medicines as you are doing We will see you back on 4/7 as previously scheduled

## 2013-10-12 NOTE — Assessment & Plan Note (Signed)
We will check a 6 min walk today Continue cellcept, prednisone, dapsone Will increase cellcept next visit

## 2013-10-25 ENCOUNTER — Telehealth: Payer: Self-pay

## 2013-10-25 ENCOUNTER — Encounter: Payer: Self-pay | Admitting: Pulmonary Disease

## 2013-10-25 ENCOUNTER — Ambulatory Visit (INDEPENDENT_AMBULATORY_CARE_PROVIDER_SITE_OTHER): Payer: BC Managed Care – PPO | Admitting: Pulmonary Disease

## 2013-10-25 VITALS — BP 130/74 | HR 80 | Ht 66.0 in | Wt 244.0 lb

## 2013-10-25 DIAGNOSIS — J8410 Pulmonary fibrosis, unspecified: Secondary | ICD-10-CM

## 2013-10-25 DIAGNOSIS — J84112 Idiopathic pulmonary fibrosis: Secondary | ICD-10-CM

## 2013-10-25 DIAGNOSIS — Z5181 Encounter for therapeutic drug level monitoring: Secondary | ICD-10-CM

## 2013-10-25 LAB — CBC
HCT: 38.3 % (ref 36.0–46.0)
Hemoglobin: 12.6 g/dL (ref 12.0–15.0)
MCHC: 32.8 g/dL (ref 30.0–36.0)
MCV: 94.1 fl (ref 78.0–100.0)
Platelets: 291 10*3/uL (ref 150.0–400.0)
RBC: 4.07 Mil/uL (ref 3.87–5.11)
RDW: 15.6 % — AB (ref 11.5–14.6)
WBC: 11.2 10*3/uL — AB (ref 4.5–10.5)

## 2013-10-25 LAB — COMPREHENSIVE METABOLIC PANEL
ALT: 13 U/L (ref 0–35)
AST: 15 U/L (ref 0–37)
Albumin: 4.1 g/dL (ref 3.5–5.2)
Alkaline Phosphatase: 72 U/L (ref 39–117)
BILIRUBIN TOTAL: 1.1 mg/dL (ref 0.3–1.2)
BUN: 15 mg/dL (ref 6–23)
CHLORIDE: 100 meq/L (ref 96–112)
CO2: 27 mEq/L (ref 19–32)
Calcium: 9.8 mg/dL (ref 8.4–10.5)
Creatinine, Ser: 0.7 mg/dL (ref 0.4–1.2)
GFR: 84.27 mL/min (ref >60.00)
Glucose, Bld: 149 mg/dL — ABNORMAL HIGH (ref 70–99)
Potassium: 3.9 mEq/L (ref 3.5–5.1)
SODIUM: 136 meq/L (ref 135–145)
TOTAL PROTEIN: 7.2 g/dL (ref 6.0–8.3)

## 2013-10-25 MED ORDER — MYCOPHENOLATE MOFETIL 500 MG PO TABS: 1000.0000 mg | ORAL_TABLET | Freq: Two times a day (BID) | ORAL | Status: DC

## 2013-10-25 NOTE — Telephone Encounter (Signed)
Message copied by Len Blalock on Tue Oct 25, 2013  1:36 PM ------      Message from: Simonne Maffucci B      Created: Tue Oct 25, 2013  1:25 PM       A,            PLease arrange teh following for [place] before the next visit:            -Obtain pulmonary function testing and a chest x-ray in 2 months, obtain 6 minute walk in 2 months.            Thanks      Ruby Cola ------

## 2013-10-25 NOTE — Patient Instructions (Signed)
Keep taking Prednisone 10mg  daily Keep taking Dapsone 100mg  dialy After we call you with the results of today's blood work, increase the dose of the Cellcept to 1000mg  in the morning and 500mg  at night We will get blood work again in 4 weeks and call you with those results If the blood work in 4 weeks is fine then we will increase the Cellcept to 1000mg  twice a day.  We will see you back in 2 months

## 2013-10-25 NOTE — Telephone Encounter (Signed)
Orders have been placed.  Pt aware.  Nothing further needed.

## 2013-10-25 NOTE — Progress Notes (Signed)
Subjective:    Patient ID: Kaitlin Hamilton, female    DOB: 03-Apr-1951, 63 y.o.   MRN: [id number]  Synopsis: Kaitlin Hamilton is a very pleasant 62 year old female who first saw the [facility] pulmonary clinic in March 2014 for evaluation of shortness of breath. She had significant cough, wheezing, and sputum production requiring multiple rounds of antibiotics. Because of crackles on lung exam and an abnormal pulmonary function test she was referred to [place]. We ordered full pulmonary function testing which showed moderate restriction and a depressed DLCO in proportion to her restriction. There is no airflow obstruction and no change with bronchodilator administration. A chest x-ray performed in February 2014 was read as normal.  An open lung biopsy was performed in December 2014 showing UIP.  Because she has had multiple good responses to steroids, we have treated her with cellcept and prednisone.  In January 2015 she had severe insomnia, hallucinations and gastritis on high dose prednisone.  She had a rash to bactrim. Since February 2015 she has been on cellcept and low dose prednisone and dapsone.  HPI   10/25/2013 ROV > Kaitlin Hamilton has not had a rash since the last visit.  She has had some dyspnea with vacuuming, and cleaning, but not worse since the last visit.  She has not had diarrhea.  She has not gone back to work. She continues to take the CellCept, prednisone, and dapsone as prescribed.    Past Medical History  Diagnosis Date  . Bronchitis   . Chicken pox   . Depression   . Allergic rhinitis     uses Flonase daily  . Hyperlipidemia     lost 43 pounds and no meds required at present  . Hypokalemia   . Anxiety   . PONV (postoperative nausea and vomiting)   . Hypertension     takes Verapamil and HCTZ daily  . Shortness of breath     with exertion;takes Singulair daily as well as Flonase  . Pneumonia     last time in 2006  . History of bronchitis     2013  . H/O hiatal hernia   . Migraines      last one about a month ago  . Dysphagia   . Joint swelling     left thumb  . OA (osteoarthritis)     left knee  . GERD (gastroesophageal reflux disease)     takes Protonix daily  . Hemorrhoids   . History of colon polyps   . Diverticulosis   . Urinary frequency   . History of kidney stones   . Non-insulin dependent type 2 diabetes mellitus     takes Glipizide daily  . Constipation     takes Fiber daily  . Hypothyroidism (acquired)     takes Synthroid daily  . Insomnia     doesn't take any meds for this        Review of Systems  Constitutional: Negative for fever, chills, activity change and fatigue.  HENT: Negative for congestion, ear pain, nosebleeds, postnasal drip, rhinorrhea and sinus pressure.   Respiratory: Positive for shortness of breath. Negative for cough and wheezing.   Cardiovascular: Negative for chest pain, palpitations and leg swelling.  Gastrointestinal: Negative for nausea and abdominal pain.       Objective:   Physical Exam   Filed Vitals:   10/25/13 1004  BP: 130/74  Pulse: 80  Height: '5\' 6"'  (1.676 m)  Weight: 244 lb (110.678 kg)  SpO2: 93%  Room  Air  Gen:  well appearing HEENT: NCAT,  EOMi, OP clear,  PULM: Crackles 1/3 way up bilaterally CV: RRR, no mgr, no JVD AB: BS+, soft, nontender, no hsm Ext: warm, no edema, no clubbing Neuro: A&Ox4, maew  February 2014 simple spirometry performed by her primary care physician>> ratio 90%, FEV1 1.71 L (64% predicted, FVC 1.83 L 55% predicted; flow volume loop is not consistent with obstruction February 2014 chest x-ray at [facility] normal 10/19/2012 walked 500 feet in office on room air oxygenation did not drop below 90% 11/15/2012 Full PFT [name]> Ratio 87%, FEV1 2.00L > 2.07 with bronchodilator (3% change); TLC 3.44 L (65% pred), ERV 0.62 (57% pred), DLCO 15.1 ml/mmHg/min (59% pred) 11/2012 CT chest >> ([facility] read) centrilobular nodules, interlobular septal thickening worse in bases and  periphery R lung > L; some GGO in bases and periphery as well, some bronchiectasis in the bases R > L; findings suggestive of fibrosis but not UIP; question hypersensitivity pneumonitis given centrilobular nodules; also question aspiration 03/2013 ANA, ANCA, Anti-Jo-1, ESR, RF, SCL-70, anti-centromere, SSA/SSB, all negative; CRP 0.6;  04/2013 Barium swallow> abnormal esophageal motility, GERD, hiatal hernia 04/2013 Full PFT> Ratio 93%, FEV1 1.92 L (71% pred), TLC 3.02L (56% pred), DLCO 15.94 (59% pred) 04/2013 CT chest ([name])> findings suggestive of but not diagnostic of NSIP, small pulmonary nodule 04/2013 6MW RA > 1100 feet, HR peak 109, O2 sat Nadir 87% 04/2013 HP panel >> 05/17/2013 3 month prednisone trial 06/2013 open lung biopsy> UIP 07/28/13 Cell cept and bactrim started > bactrim stopped 07/29/13 due to rash  07/2013 hospitalized for confusion > prednisone 07/2013 hospitalized for gastritis/abdominal pain> prednisone 08/22/2013 atovaquone caused rash      Assessment & Plan:   UIP (usual interstitial pneumonitis) This is been a stable interval for [place]. She is tolerating the medicines well.  Plan: -CBC and CMET today -If those are normal then increased dose of CellCept to 1000 mg in the morning and 500 in the evening for 4 weeks -Repeat CBC and CMET in 4 weeks -If the lab work in 4 weeks is normal then increased dose of CellCept to 1000 mg twice a day -Followup with me in 2 months -Obtain pulmonary function testing and a chest x-ray in 2 months, obtain 6 minute walk in 2 months.    Updated Medication List Outpatient Encounter Prescriptions as of 10/25/2013  Medication Sig  . acetaminophen (TYLENOL) 500 MG tablet Take 1,000 mg by mouth every 6 (six) hours as needed for moderate pain.  . Azelastine-Fluticasone (DYMISTA) 137-50 MCG/ACT SUSP One puff per nostril twice daily  . dapsone 100 MG tablet Take 1 tablet (100 mg total) by mouth daily.  . diphenhydrAMINE (BENADRYL) 25 mg  capsule Take 25 mg by mouth as needed.  Marland Kitchen FIBER PO Take 1 tablet by mouth daily.  Marland Kitchen glipiZIDE (GLUCOTROL) 10 MG tablet Take 1 tablet (10 mg total) by mouth 2 (two) times daily before a meal.  . hydrochlorothiazide (MICROZIDE) 12.5 MG capsule Take 12.5 mg by mouth daily.  . hydrocortisone cream 0.5 % Apply 1 application topically 2 (two) times daily as needed (for rash).  Marland Kitchen levothyroxine (SYNTHROID, LEVOTHROID) 125 MCG tablet Take 125 mcg by mouth daily before breakfast.  . montelukast (SINGULAIR) 10 MG tablet Take 10 mg by mouth at bedtime.  . mycophenolate (CELLCEPT) 500 MG tablet Take 1 tablet (500 mg total) by mouth 2 (two) times daily.  . pantoprazole (PROTONIX) 40 MG tablet Take 1 tablet (40 mg total)  by mouth 2 (two) times daily.  . predniSONE (DELTASONE) 10 MG tablet Take 1 tablet (10 mg total) by mouth daily with breakfast.  . Probiotic Product (PROBIOTIC DAILY PO) Take 2 capsules by mouth 2 (two) times daily.  . traMADol-acetaminophen (ULTRACET) 37.5-325 MG per tablet Take 1 tablet by mouth every 6 (six) hours as needed.  . traZODone (DESYREL) 50 MG tablet Take 50 mg by mouth at bedtime as needed.  . verapamil (VERELAN PM) 180 MG 24 hr capsule Take 180 mg by mouth daily.  . [DISCONTINUED] traZODone (DESYREL) 50 MG tablet Take 1 tablet (50 mg total) by mouth at bedtime.  . [DISCONTINUED] Elastic Bandages & Supports (ANKLE LACE-UP BRACE) MISC 1 Units by Does not apply route once.

## 2013-10-25 NOTE — Assessment & Plan Note (Signed)
This is been a stable interval for [facility]. She is tolerating the medicines well.  Plan: -CBC and CMET today -If those are normal then increased dose of CellCept to 1000 mg in the morning and 500 in the evening for 4 weeks -Repeat CBC and CMET in 4 weeks -If the lab work in 4 weeks is normal then increased dose of CellCept to 1000 mg twice a day -Followup with me in 2 months -Obtain pulmonary function testing and a chest x-ray in 2 months, obtain 6 minute walk in 2 months.

## 2013-10-26 ENCOUNTER — Other Ambulatory Visit: Payer: Self-pay

## 2013-10-26 ENCOUNTER — Telehealth: Payer: Self-pay

## 2013-10-26 MED ORDER — MYCOPHENOLATE MOFETIL 500 MG PO TABS: ORAL_TABLET | ORAL | Status: DC

## 2013-10-26 NOTE — Telephone Encounter (Signed)
Pt aware of results and recs.  E-scribed in increased dose of Cellcept to [employer].  Nothing further needed.

## 2013-10-26 NOTE — Telephone Encounter (Signed)
Message copied by Len Blalock on Wed Oct 26, 2013 12:20 PM ------      Message from: Juanito Doom      Created: Tue Oct 25, 2013  5:29 PM       Caryl Pina,            Please let her know that her labs are normal.            Thanks,      Ruby Cola ------

## 2013-10-27 ENCOUNTER — Other Ambulatory Visit: Payer: Self-pay

## 2013-11-08 ENCOUNTER — Other Ambulatory Visit (INDEPENDENT_AMBULATORY_CARE_PROVIDER_SITE_OTHER): Payer: BC Managed Care – PPO

## 2013-11-08 ENCOUNTER — Telehealth: Payer: Self-pay | Admitting: Family Medicine

## 2013-11-08 DIAGNOSIS — E119 Type 2 diabetes mellitus without complications: Secondary | ICD-10-CM

## 2013-11-08 DIAGNOSIS — E039 Hypothyroidism, unspecified: Secondary | ICD-10-CM

## 2013-11-08 DIAGNOSIS — I10 Essential (primary) hypertension: Secondary | ICD-10-CM

## 2013-11-08 DIAGNOSIS — E7800 Pure hypercholesterolemia, unspecified: Secondary | ICD-10-CM

## 2013-11-08 LAB — COMPREHENSIVE METABOLIC PANEL
ALBUMIN: 3.9 g/dL (ref 3.5–5.2)
ALT: 15 U/L (ref 0–35)
AST: 18 U/L (ref 0–37)
Alkaline Phosphatase: 60 U/L (ref 39–117)
BUN: 17 mg/dL (ref 6–23)
CALCIUM: 9.5 mg/dL (ref 8.4–10.5)
CHLORIDE: 101 meq/L (ref 96–112)
CO2: 28 mEq/L (ref 19–32)
Creatinine, Ser: 0.7 mg/dL (ref 0.4–1.2)
GFR: 84.26 mL/min (ref >60.00)
GLUCOSE: 155 mg/dL — AB (ref 70–99)
POTASSIUM: 3.7 meq/L (ref 3.5–5.1)
Sodium: 139 mEq/L (ref 135–145)
Total Bilirubin: 1.7 mg/dL — ABNORMAL HIGH (ref 0.3–1.2)
Total Protein: 7 g/dL (ref 6.0–8.3)

## 2013-11-08 LAB — HEMOGLOBIN A1C: Hgb A1c MFr Bld: 4.2 % — ABNORMAL LOW (ref 4.6–6.5)

## 2013-11-08 LAB — LIPID PANEL
Cholesterol: 236 mg/dL — ABNORMAL HIGH (ref 0–200)
HDL: 57.4 mg/dL (ref >39.00)
LDL Cholesterol: 151 mg/dL — ABNORMAL HIGH (ref 0–99)
TRIGLYCERIDES: 140 mg/dL (ref 0.0–149.0)
Total CHOL/HDL Ratio: 4
VLDL: 28 mg/dL (ref 0.0–40.0)

## 2013-11-08 LAB — MICROALBUMIN / CREATININE URINE RATIO
Creatinine,U: 90.7 mg/dL
MICROALB/CREAT RATIO: 5.6 mg/g (ref 0.0–30.0)
Microalb, Ur: 5.1 mg/dL — ABNORMAL HIGH (ref 0.0–1.9)

## 2013-11-08 NOTE — Addendum Note (Signed)
Addended by: Ellamae Sia on: 11/08/2013 08:58 AM   Modules accepted: Orders

## 2013-11-08 NOTE — Telephone Encounter (Signed)
Message copied by [name] E on Tue Nov 08, 2013  7:20 AM ------      Message from: Ellamae Sia      Created: Wed Nov 02, 2013  3:47 PM      Regarding: Lab orders for Tuesday, 4.21.15       Patient is scheduled for CPX labs, please order future labs, Thanks , Terri       ------

## 2013-11-11 ENCOUNTER — Other Ambulatory Visit (HOSPITAL_COMMUNITY)
Admission: RE | Admit: 2013-11-11 | Discharge: 2013-11-11 | Disposition: A | Discharge status: Home / Self Care | Payer: BC Managed Care – PPO | Source: Ambulatory Visit | Attending: Family Medicine | Admitting: Family Medicine

## 2013-11-11 ENCOUNTER — Encounter: Payer: Self-pay | Admitting: Family Medicine

## 2013-11-11 ENCOUNTER — Ambulatory Visit (INDEPENDENT_AMBULATORY_CARE_PROVIDER_SITE_OTHER): Payer: BC Managed Care – PPO | Admitting: Family Medicine

## 2013-11-11 ENCOUNTER — Telehealth: Payer: Self-pay | Admitting: Family Medicine

## 2013-11-11 VITALS — BP 120/78 | HR 88 | Temp 97.9°F | Ht 66.34 in | Wt 243.2 lb

## 2013-11-11 DIAGNOSIS — E119 Type 2 diabetes mellitus without complications: Secondary | ICD-10-CM

## 2013-11-11 DIAGNOSIS — IMO0002 Reserved for concepts with insufficient information to code with codable children: Secondary | ICD-10-CM | POA: Insufficient documentation

## 2013-11-11 DIAGNOSIS — I10 Essential (primary) hypertension: Secondary | ICD-10-CM

## 2013-11-11 DIAGNOSIS — E7800 Pure hypercholesterolemia, unspecified: Secondary | ICD-10-CM

## 2013-11-11 DIAGNOSIS — Z0000 Encounter for general adult medical examination without abnormal findings: Secondary | ICD-10-CM

## 2013-11-11 DIAGNOSIS — Z1151 Encounter for screening for human papillomavirus (HPV): Secondary | ICD-10-CM | POA: Insufficient documentation

## 2013-11-11 DIAGNOSIS — N8111 Cystocele, midline: Secondary | ICD-10-CM

## 2013-11-11 DIAGNOSIS — Z01419 Encounter for gynecological examination (general) (routine) without abnormal findings: Secondary | ICD-10-CM | POA: Insufficient documentation

## 2013-11-11 DIAGNOSIS — Z124 Encounter for screening for malignant neoplasm of cervix: Secondary | ICD-10-CM

## 2013-11-11 DIAGNOSIS — R809 Proteinuria, unspecified: Secondary | ICD-10-CM

## 2013-11-11 MED ORDER — LOSARTAN POTASSIUM-HCTZ 50-12.5 MG PO TABS: 1.0000 | ORAL_TABLET | Freq: Every day | ORAL | Status: DC

## 2013-11-11 NOTE — Progress Notes (Signed)
Pre visit review using our clinic review tool, if applicable. No additional management support is needed unless otherwise documented below in the visit note. 

## 2013-11-11 NOTE — Assessment & Plan Note (Signed)
Inadequate control, cahnge HCTZ to ARB and HCTZ, continue verapamil.

## 2013-11-11 NOTE — Assessment & Plan Note (Signed)
Start ARB, will have lab in May wit pulm including Creatinine

## 2013-11-11 NOTE — Patient Instructions (Addendum)
Keep working on healthy eating, exercise and weight loss.  Start losartan/HCTZ  instead of HCTZ alone. Follow up in 3 months with fasting labs prior. Look into shingles vaccine coverage. When schedule mammogram in 06/2014 also set up bone density.

## 2013-11-11 NOTE — Assessment & Plan Note (Signed)
A1C good but FBS do not reflect... Follow and make lifestyle change, no med changes yet.

## 2013-11-11 NOTE — Addendum Note (Signed)
Addended by: Carter Kitten on: 11/11/2013 03:36 PM   Modules accepted: Orders

## 2013-11-11 NOTE — Assessment & Plan Note (Signed)
No tat goal LDL < 100, she will work on lifestyle change and recheck in 3 months

## 2013-11-11 NOTE — Progress Notes (Signed)
Subjective:    Patient ID: Kaitlin Hamilton, female    DOB: 1950/11/06, 63 y.o.   MRN: [id number]  HPI I have personally reviewed the Medicare Annual Wellness questionnaire and have noted 1. The patient's medical and social history 2. Their use of alcohol, tobacco or illicit drugs 3. Their current medications and supplements 4. The patient's functional ability including ADL's, fall risks, home safety risks and hearing or visual             impairment. 5. Diet and physical activities 6. Evidence for depression or mood disorders The patients weight, height, BMI and visual acuity have been recorded in the chart I have made referrals, counseling and provided education to the patient based review of the above and I have provided the pt with a written personalized care plan for preventive services.   Hypertension:  Borderline control on current regimen of verapamil and HCTZ BP Readings from Last 3 Encounters:  11/11/13 120/78  10/25/13 130/74  10/12/13 142/84  Using medication without problems or lightheadedness: None Chest pain with exertion: none Edema:None Short of breath: Yes Average home BPs: 127-148/70- 82 Other issues:  Diabetes:  Very good controlled DM  Per A1C on current regimen of max glipizide Lab Results  Component Value Date   HGBA1C 4.2* 11/08/2013  Using medications without difficulties: Hypoglycemic episodes:None Hyperglycemic episodes: Yes Feet problems:None Blood Sugars averaging: Sos recent increase, she has been back on prednsione in last few months, FBS 137-176, 2 hr after meals 180-268 eye exam within last year: Wt Readings from Last 3 Encounters:  11/11/13 243 lb 4 oz (110.337 kg)  10/25/13 244 lb (110.678 kg)  10/12/13 241 lb (109.317 kg)  Has been making further diet change, decreasing tea, soda  Elevated Cholesterol: LDL not at goal on no medication  SE to statin in past.  She is not interested in med at this time, if not coming down with life changes  she will consider Lab Results  Component Value Date   CHOL 236* 11/08/2013   HDL 57.40 11/08/2013   LDLCALC 151* 11/08/2013   TRIG 140.0 11/08/2013   CHOLHDL 4 11/08/2013   Diet compliance: improving Exercise:none Other complaints:   Review of Systems  Constitutional: Negative for fever and fatigue.  HENT: Negative for ear pain.   Eyes: Negative for pain.  Respiratory: Negative for chest tightness and shortness of breath.   Cardiovascular: Negative for chest pain, palpitations and leg swelling.  Gastrointestinal: Negative for abdominal pain.  Genitourinary: Negative for dysuria.       Objective:   Physical Exam  Constitutional: Vital signs are normal. She appears well-developed and well-nourished. She is cooperative.  Non-toxic appearance. She does not appear ill. No distress.  obese  HENT:  Head: Normocephalic.  Right Ear: Hearing, tympanic membrane, external ear and ear canal normal.  Left Ear: Hearing, tympanic membrane, external ear and ear canal normal.  Nose: Nose normal.  Eyes: Conjunctivae, EOM and lids are normal. Pupils are equal, round, and reactive to light. Lids are everted and swept, no foreign bodies found.  Neck: Trachea normal and normal range of motion. Neck supple. Carotid bruit is not present. No mass and no thyromegaly present.  Cardiovascular: Normal rate, regular rhythm, S1 normal, S2 normal, normal heart sounds and intact distal pulses.  Exam reveals no gallop.   No murmur heard. Pulmonary/Chest: Effort normal and breath sounds normal. No respiratory distress. She has no wheezes. She has no rhonchi. She has no rales.  Abdominal:  Soft. Normal appearance and bowel sounds are normal. She exhibits no distension, no fluid wave, no abdominal bruit and no mass. There is no hepatosplenomegaly. There is no tenderness. There is no rebound, no guarding and no CVA tenderness. No hernia.  Genitourinary: Vagina normal and uterus normal. No breast swelling, tenderness,  discharge or bleeding. Pelvic exam was performed with patient supine. There is no rash, tenderness or lesion on the right labia. There is no rash, tenderness or lesion on the left labia. Uterus is not enlarged and not tender. Cervix exhibits no motion tenderness, no discharge and no friability. Right adnexum displays no mass, no tenderness and no fullness. Left adnexum displays no mass, no tenderness and no fullness.  Grade 1 cyctocele, pt assymptomatic.  Lymphadenopathy:    She has no cervical adenopathy.    She has no axillary adenopathy.  Neurological: She is alert. She has normal strength. No cranial nerve deficit or sensory deficit.  Skin: Skin is warm, dry and intact. No rash noted.  Psychiatric: Her speech is normal and behavior is normal. Judgment normal. Her mood appears not anxious. Cognition and memory are normal. She does not exhibit a depressed mood.          Assessment & Plan:  The patient's preventative maintenance and recommended screening tests for an annual wellness exam were reviewed in full today. Brought up to date unless services declined.  Counselled on the importance of diet, exercise, and its role in overall health and mortality. The patient's FH and SH was reviewed, including their home life, tobacco status, and drug and alcohol status.   Vaccines: Due for prevnar and shingles... She will look into Colon: 05/2013, Dr  Hilarie Fredrickson repeat in 3 years  Mammo: 06/2013 nml  PAP/DVE:last nml in 2011, due   nonsmoker DEXA: on longterm prednisone so high risk, due for DEXA, will due with mammo in winter.

## 2013-11-11 NOTE — Telephone Encounter (Signed)
Relevant patient education assigned to patient using Emmi. ° °

## 2013-11-14 ENCOUNTER — Other Ambulatory Visit: Payer: Self-pay

## 2013-11-14 MED ORDER — LOSARTAN POTASSIUM-HCTZ 50-12.5 MG PO TABS: 1.0000 | ORAL_TABLET | Freq: Every day | ORAL | Status: DC

## 2013-11-14 NOTE — Telephone Encounter (Signed)
Pt had requested losartan HCTZ sent to [facility] on 11/11/13;but med was sent to [facility]. Pt said that was OK but was going out of town and needed to start med now. Advised pt will send # [phone] to [facility]. Pt voiced understanding.

## 2013-11-17 ENCOUNTER — Telehealth: Payer: Self-pay

## 2013-11-17 NOTE — Telephone Encounter (Signed)
[facility] left v/m; specialty pharmacy received prescription that should have went to [facility] and specialty pharmacy forwarded rx to [facility] in [place]. pts pharmacy med list update.

## 2013-11-18 ENCOUNTER — Telehealth: Payer: Self-pay

## 2013-11-18 NOTE — Telephone Encounter (Signed)
Relevant patient education assigned to patient using Emmi. ° °

## 2013-11-28 ENCOUNTER — Other Ambulatory Visit (INDEPENDENT_AMBULATORY_CARE_PROVIDER_SITE_OTHER): Payer: BC Managed Care – PPO

## 2013-11-28 DIAGNOSIS — Z5181 Encounter for therapeutic drug level monitoring: Secondary | ICD-10-CM

## 2013-11-28 LAB — COMPREHENSIVE METABOLIC PANEL
ALK PHOS: 65 U/L (ref 39–117)
ALT: 17 U/L (ref 0–35)
AST: 20 U/L (ref 0–37)
Albumin: 4.2 g/dL (ref 3.5–5.2)
BUN: 16 mg/dL (ref 6–23)
CO2: 24 mEq/L (ref 19–32)
Calcium: 9.8 mg/dL (ref 8.4–10.5)
Chloride: 104 mEq/L (ref 96–112)
Creatinine, Ser: 0.9 mg/dL (ref 0.4–1.2)
GFR: 66.36 mL/min (ref >60.00)
Glucose, Bld: 135 mg/dL — ABNORMAL HIGH (ref 70–99)
Potassium: 3.6 mEq/L (ref 3.5–5.1)
SODIUM: 140 meq/L (ref 135–145)
Total Bilirubin: 0.9 mg/dL (ref 0.2–1.2)
Total Protein: 7.4 g/dL (ref 6.0–8.3)

## 2013-11-28 LAB — CBC
HCT: 36.6 % (ref 36.0–46.0)
Hemoglobin: 11.9 g/dL — ABNORMAL LOW (ref 12.0–15.0)
MCHC: 32.6 g/dL (ref 30.0–36.0)
MCV: 101.2 fl — AB (ref 78.0–100.0)
PLATELETS: 355 10*3/uL (ref 150.0–400.0)
RBC: 3.62 Mil/uL — ABNORMAL LOW (ref 3.87–5.11)
RDW: 16.3 % — ABNORMAL HIGH (ref 11.5–15.5)
WBC: 9.3 10*3/uL (ref 4.0–10.5)

## 2013-11-28 NOTE — Progress Notes (Signed)
Quick Note:  Spoke with pt, she is aware of results. Nothing further needed at this time. ______ 

## 2013-12-23 ENCOUNTER — Encounter: Payer: Self-pay | Admitting: Pulmonary Disease

## 2013-12-23 ENCOUNTER — Other Ambulatory Visit (INDEPENDENT_AMBULATORY_CARE_PROVIDER_SITE_OTHER): Payer: BC Managed Care – PPO

## 2013-12-23 ENCOUNTER — Ambulatory Visit (INDEPENDENT_AMBULATORY_CARE_PROVIDER_SITE_OTHER): Payer: BC Managed Care – PPO | Admitting: Pulmonary Disease

## 2013-12-23 VITALS — BP 126/64 | HR 85 | Ht 66.0 in | Wt 257.8 lb

## 2013-12-23 DIAGNOSIS — R0609 Other forms of dyspnea: Secondary | ICD-10-CM

## 2013-12-23 DIAGNOSIS — R0989 Other specified symptoms and signs involving the circulatory and respiratory systems: Secondary | ICD-10-CM

## 2013-12-23 DIAGNOSIS — J8410 Pulmonary fibrosis, unspecified: Secondary | ICD-10-CM

## 2013-12-23 DIAGNOSIS — J84112 Idiopathic pulmonary fibrosis: Secondary | ICD-10-CM

## 2013-12-23 DIAGNOSIS — R0600 Dyspnea, unspecified: Secondary | ICD-10-CM

## 2013-12-23 LAB — PULMONARY FUNCTION TEST
DL/VA % pred: 88 %
DL/VA: 4.48 ml/min/mmHg/L
DLCO unc % pred: 48 %
DLCO unc: 13.11 ml/min/mmHg
FEF 25-75 PRE: 3.34 L/s
FEF 25-75 Post: 3.44 L/sec
FEF2575-%CHANGE-POST: 3 %
FEF2575-%PRED-POST: 147 %
FEF2575-%PRED-PRE: 143 %
FEV1-%Change-Post: 0 %
FEV1-%PRED-POST: 64 %
FEV1-%PRED-PRE: 64 %
FEV1-PRE: 1.71 L
FEV1-Post: 1.71 L
FEV1FVC-%Change-Post: 2 %
FEV1FVC-%PRED-PRE: 117 %
FEV6-%Change-Post: -2 %
FEV6-%PRED-POST: 55 %
FEV6-%Pred-Pre: 56 %
FEV6-Post: 1.85 L
FEV6-Pre: 1.9 L
FEV6FVC-%Pred-Post: 104 %
FEV6FVC-%Pred-Pre: 104 %
FVC-%Change-Post: -2 %
FVC-%PRED-PRE: 54 %
FVC-%Pred-Post: 53 %
FVC-Post: 1.85 L
FVC-Pre: 1.9 L
PRE FEV1/FVC RATIO: 90 %
PRE FEV6/FVC RATIO: 100 %
Post FEV1/FVC ratio: 93 %
Post FEV6/FVC ratio: 100 %
RV % pred: 30 %
RV: 0.65 L
TLC % pred: 49 %
TLC: 2.67 L

## 2013-12-23 LAB — CBC
HEMATOCRIT: 36.4 % (ref 36.0–46.0)
HEMOGLOBIN: 11.8 g/dL — AB (ref 12.0–15.0)
MCHC: 32.3 g/dL (ref 30.0–36.0)
MCV: 98.5 fl (ref 78.0–100.0)
Platelets: 322 10*3/uL (ref 150.0–400.0)
RBC: 3.7 Mil/uL — ABNORMAL LOW (ref 3.87–5.11)
RDW: 16.9 % — ABNORMAL HIGH (ref 11.5–15.5)
WBC: 13 10*3/uL — ABNORMAL HIGH (ref 4.0–10.5)

## 2013-12-23 LAB — COMPREHENSIVE METABOLIC PANEL
ALT: 22 U/L (ref 0–35)
AST: 27 U/L (ref 0–37)
Albumin: 4.3 g/dL (ref 3.5–5.2)
Alkaline Phosphatase: 79 U/L (ref 39–117)
BUN: 18 mg/dL (ref 6–23)
CALCIUM: 9.8 mg/dL (ref 8.4–10.5)
CO2: 26 mEq/L (ref 19–32)
CREATININE: 1 mg/dL (ref 0.4–1.2)
Chloride: 99 mEq/L (ref 96–112)
GFR: 61.63 mL/min (ref >60.00)
Glucose, Bld: 282 mg/dL — ABNORMAL HIGH (ref 70–99)
Potassium: 4.6 mEq/L (ref 3.5–5.1)
Sodium: 136 mEq/L (ref 135–145)
Total Bilirubin: 0.6 mg/dL (ref 0.2–1.2)
Total Protein: 7.1 g/dL (ref 6.0–8.3)

## 2013-12-23 MED ORDER — MYCOPHENOLATE MOFETIL 500 MG PO TABS: ORAL_TABLET | ORAL | Status: DC

## 2013-12-23 NOTE — Patient Instructions (Signed)
Increase the dose of Cellcept to 1000mg  twice a day If your blood work is normal in one month then increase the Cellcept to 1500mg  in the morning and 1000mg  in the evening Keep taking prednisone as you are doing Keep taking dapsone as you are doing Use 2L O2 with exertion with a portable oxygen concentrator We will see you back in 6-8 weeks in [place] or sooner if needed

## 2013-12-23 NOTE — Progress Notes (Signed)
Subjective:    Patient ID: Kaitlin Hamilton, female    DOB: 11-19-1950, 63 y.o.   MRN: [id number]  Synopsis: Kaitlin Hamilton is a very pleasant 63 year old female who first saw the [facility] pulmonary clinic in March 2014 for evaluation of shortness of breath. She had significant cough, wheezing, and sputum production requiring multiple rounds of antibiotics. Because of crackles on lung exam and an abnormal pulmonary function test she was referred to [place]. We ordered full pulmonary function testing which showed moderate restriction and a depressed DLCO in proportion to her restriction. There is no airflow obstruction and no change with bronchodilator administration. A chest x-ray performed in February 2014 was read as normal.  An open lung biopsy was performed in December 2014 showing UIP.  Because she has had multiple good responses to steroids, we have treated her with cellcept and prednisone.  In January 2015 she had severe insomnia, hallucinations and gastritis on high dose prednisone.  She had a rash to bactrim. Since February 2015 she has been on cellcept and low dose prednisone and dapsone.  HPI  12/23/2013> He says that overall her health seems to be better since the wintertime. Her energy level is better than before and she is sleeping okay. Her appetite is okay. She has increased her weight by about 10-12 pounds. However, she says that her shortness of breath appears to be worsening with regular activity. Specifically she mentions carrying out laundry. She has been measuring her oxygen saturation at home and she says that sometimes at rest it does hangs around 88-89%. With exertion it consistently drops down into the 80s. She continues to use CPAP at night with oxygen. She has not increased her dose of CellCept unfortunately. She continues to take the prednisone and dapsone as she is supposed to. She has not had any specific complaint related to the medication, no rash, no diarrhea. She has not had  much cough. No leg swelling, no chest pain. No fevers or chills. She continues to have a little bit of mucus production from her nose.   Past Medical History  Diagnosis Date  . Bronchitis   . Chicken pox   . Depression   . Allergic rhinitis     uses Flonase daily  . Hyperlipidemia     lost 43 pounds and no meds required at present  . Hypokalemia   . Anxiety   . PONV (postoperative nausea and vomiting)   . Hypertension     takes Verapamil and HCTZ daily  . Shortness of breath     with exertion;takes Singulair daily as well as Flonase  . Pneumonia     last time in 2006  . History of bronchitis     2013  . H/O hiatal hernia   . Migraines     last one about a month ago  . Dysphagia   . Joint swelling     left thumb  . OA (osteoarthritis)     left knee  . GERD (gastroesophageal reflux disease)     takes Protonix daily  . Hemorrhoids   . History of colon polyps   . Diverticulosis   . Urinary frequency   . History of kidney stones   . Non-insulin dependent type 2 diabetes mellitus     takes Glipizide daily  . Constipation     takes Fiber daily  . Hypothyroidism (acquired)     takes Synthroid daily  . Insomnia     doesn't take any meds for this  Review of Systems  Constitutional: Negative for fever, chills, activity change and fatigue.  HENT: Negative for congestion, ear pain, nosebleeds, postnasal drip, rhinorrhea and sinus pressure.   Respiratory: Positive for shortness of breath. Negative for cough and wheezing.   Cardiovascular: Negative for chest pain, palpitations and leg swelling.  Gastrointestinal: Negative for nausea and abdominal pain.       Objective:   Physical Exam   Filed Vitals:   12/23/13 1204  BP: 126/64  Pulse: 85  Height: '5\' 6"'  (1.676 m)  Weight: 257 lb 12.8 oz (116.937 kg)  SpO2: 90%  Room Air  She dropped to 78% with ambulation on room air today, this improved with 2 L  Gen:  well appearing HEENT: NCAT,  EOMi, OP clear,   PULM: Crackles 1/3 way up bilaterally CV: RRR, no mgr, no JVD AB: BS+, soft, nontender, no hsm Ext: warm, no edema, no clubbing Neuro: A&Ox4, maew  February 2014 simple spirometry performed by her primary care physician>> ratio 90%, FEV1 1.71 L (64% predicted, FVC 1.83 L 55% predicted; flow volume loop is not consistent with obstruction February 2014 chest [facility] at [facility] normal 10/19/2012 walked 500 feet in office on room air oxygenation did not drop below 90% 11/15/2012 Full PFT [name]> Ratio 87%, FEV1 2.00L > 2.07 with bronchodilator (3% change); TLC 3.44 L (65% pred), ERV 0.62 (57% pred), DLCO 15.1 ml/mmHg/min (59% pred) 11/2012 CT chest >> ([facility] read) centrilobular nodules, interlobular septal thickening worse in bases and periphery R lung > L; some GGO in bases and periphery as well, some bronchiectasis in the bases R > L; findings suggestive of fibrosis but not UIP; question hypersensitivity pneumonitis given centrilobular nodules; also question aspiration 03/2013 ANA, ANCA, Anti-Jo-1, ESR, RF, SCL-70, anti-centromere, SSA/SSB, all negative; CRP 0.6;  04/2013 Barium swallow> abnormal esophageal motility, GERD, hiatal hernia 04/2013 Full PFT> Ratio 93%, FEV1 1.92 L (71% pred), TLC 3.02L (56% pred), DLCO 15.94 (59% pred) 04/2013 CT chest ([name])> findings suggestive of but not diagnostic of NSIP, small pulmonary nodule 04/2013 6MW RA > 1100 feet, HR peak 109, O2 sat Nadir 87% 04/2013 HP panel >> 05/17/2013 3 month prednisone trial 06/2013 open lung biopsy> UIP 07/28/13 Cell cept and bactrim started > bactrim stopped 07/29/13 due to rash  07/2013 hospitalized for confusion > prednisone 07/2013 hospitalized for gastritis/abdominal pain> prednisone 08/22/2013 atovaquone caused rash 11/2013 6MW 1364 feet, O2 saturation nadir 78% May 2015 full pulmonary function test ratio 93%, FEV1 1.71 L (64% predicted, no change with bronchodilator), total lung capacity 2.67 L (49% predicted), DLCO 13.11 (40%  predicted)      Assessment & Plan:   UIP (usual interstitial pneumonitis) Even though Kaitlin Hamilton 6 minute walk distance is up by 250 feet today her oxygenation has worsened. Further, her total lung capacity and DLCO have declined.  As noted in multiple previous notes I believe that she has UIP associated with a ill-defined connective tissue disease that she has been steroid responsive in the past.  I am disappointed she has not increased the dose of Cellcept like we discussed on the last visit.    Plan: -Start 2 L of oxygen with exertion -Increase CellCept to 1000 mg twice a day -CBC and CMET -Continue monthly comprehensive metabolic panels - If the lab work in July is normal then she should increase her dose to 1500 mg in the morning and 1000 mg in the evening (I went over this extensively with her and her friend and provided written instructions.) -  Continue prednisone at 10 mg daily -Continue dapsone   more that 25 minutes were taken today to discuss the care plan with the patient and her caregiver  Updated Medication List Outpatient Encounter Prescriptions as of 12/23/2013  Medication Sig  . acetaminophen (TYLENOL) 500 MG tablet Take 1,000 mg by mouth every 6 (six) hours as needed for moderate pain.  . Azelastine-Fluticasone (DYMISTA) 137-50 MCG/ACT SUSP One puff per nostril twice daily  . dapsone 100 MG tablet Take 1 tablet (100 mg total) by mouth daily.  . diphenhydrAMINE (BENADRYL) 25 mg capsule Take 25 mg by mouth as needed.  Marland Kitchen FIBER PO Take 1 tablet by mouth daily.  Marland Kitchen glipiZIDE (GLUCOTROL) 10 MG tablet Take 1 tablet (10 mg total) by mouth 2 (two) times daily before a meal.  . hydrocortisone cream 0.5 % Apply 1 application topically 2 (two) times daily as needed (for rash).  Marland Kitchen levothyroxine (SYNTHROID, LEVOTHROID) 125 MCG tablet Take 125 mcg by mouth daily before breakfast.  . losartan-hydrochlorothiazide (HYZAAR) 50-12.5 MG per tablet Take 1 tablet by mouth daily.  .  montelukast (SINGULAIR) 10 MG tablet Take 10 mg by mouth at bedtime.  . mycophenolate (CELLCEPT) 500 MG tablet Take 1049m qam, 5025mqhs  . pantoprazole (PROTONIX) 40 MG tablet Take 1 tablet (40 mg total) by mouth 2 (two) times daily.  . predniSONE (DELTASONE) 10 MG tablet Take 1 tablet (10 mg total) by mouth daily with breakfast.  . Probiotic Product (PROBIOTIC DAILY PO) Take 2 capsules by mouth 2 (two) times daily. Probiotic 10  . traMADol-acetaminophen (ULTRACET) 37.5-325 MG per tablet Take 1 tablet by mouth every 6 (six) hours as needed.  . verapamil (VERELAN PM) 180 MG 24 hr capsule Take 180 mg by mouth daily.  . traZODone (DESYREL) 50 MG tablet Take 50 mg by mouth at bedtime as needed.

## 2013-12-23 NOTE — Progress Notes (Signed)
PFT done today. 

## 2013-12-23 NOTE — Assessment & Plan Note (Addendum)
Even though Kaitlin Hamilton's 6 minute walk distance is up by 250 feet today her oxygenation has worsened. Further, her total lung capacity and DLCO have declined.  As noted in multiple previous notes I believe that she has UIP associated with a ill-defined connective tissue disease that she has been steroid responsive in the past.  I am disappointed she has not increased the dose of Cellcept like we discussed on the last visit.    Plan: -Start 2 L of oxygen with exertion -Increase CellCept to 1000 mg twice a day -CBC and CMET -Continue monthly comprehensive metabolic panels - If the lab work in July is normal then she should increase her dose to 1500 mg in the morning and 1000 mg in the evening (I went over this extensively with her and her friend and provided written instructions.) - Continue prednisone at 10 mg daily -Continue dapsone - Because of the progression in her hypoxemia and worsening pulmonary function testing I am going to ask that she be evaluated at [facility] interstitial lung disease clinic for a second opinion

## 2013-12-28 ENCOUNTER — Telehealth: Payer: Self-pay

## 2013-12-28 NOTE — Telephone Encounter (Signed)
Pt aware of results and recs, will let her PCP know.  She also states that she had eaten lunch right before heading to have labs drawn, maybe 20 minutes before labs were drawn.  Lunch was fried chicken, cornbread, salad, and 1/2unsweet and 1/2 sweet tea.   Just an fyi.

## 2013-12-28 NOTE — Telephone Encounter (Signed)
Message copied by Len Blalock on Wed Dec 28, 2013 11:32 AM ------      Message from: Simonne Maffucci B      Created: Mon Dec 26, 2013  7:12 AM       A,            Please let her know that her labs looked OK but she had a high blood sugar.  This is likely related to the prednisone, but at this point I don't recommend that she take any less than she is now.  She needs to let us know when she ate prior to that blood draw and then she should let her primary care doctor know that it was so high.            Thanks      B ------

## 2014-01-12 LAB — HM DIABETES EYE EXAM

## 2014-01-19 ENCOUNTER — Other Ambulatory Visit (INDEPENDENT_AMBULATORY_CARE_PROVIDER_SITE_OTHER): Payer: BC Managed Care – PPO

## 2014-01-19 DIAGNOSIS — J8410 Pulmonary fibrosis, unspecified: Secondary | ICD-10-CM

## 2014-01-19 DIAGNOSIS — J84112 Idiopathic pulmonary fibrosis: Secondary | ICD-10-CM

## 2014-01-19 LAB — COMPREHENSIVE METABOLIC PANEL
ALT: 19 U/L (ref 0–35)
AST: 21 U/L (ref 0–37)
Albumin: 4.2 g/dL (ref 3.5–5.2)
Alkaline Phosphatase: 61 U/L (ref 39–117)
BUN: 15 mg/dL (ref 6–23)
CO2: 31 meq/L (ref 19–32)
Calcium: 9.7 mg/dL (ref 8.4–10.5)
Chloride: 100 mEq/L (ref 96–112)
Creatinine, Ser: 0.8 mg/dL (ref 0.4–1.2)
GFR: 79.24 mL/min (ref >60.00)
Glucose, Bld: 155 mg/dL — ABNORMAL HIGH (ref 70–99)
POTASSIUM: 3.6 meq/L (ref 3.5–5.1)
Sodium: 137 mEq/L (ref 135–145)
Total Bilirubin: 1.3 mg/dL — ABNORMAL HIGH (ref 0.2–1.2)
Total Protein: 7.1 g/dL (ref 6.0–8.3)

## 2014-01-31 ENCOUNTER — Encounter: Payer: Self-pay | Admitting: Pulmonary Disease

## 2014-01-31 ENCOUNTER — Telehealth: Payer: Self-pay | Admitting: Pulmonary Disease

## 2014-01-31 ENCOUNTER — Ambulatory Visit (INDEPENDENT_AMBULATORY_CARE_PROVIDER_SITE_OTHER): Payer: BC Managed Care – PPO | Admitting: Pulmonary Disease

## 2014-01-31 VITALS — BP 128/66 | Ht 66.0 in | Wt 258.0 lb

## 2014-01-31 DIAGNOSIS — J9621 Acute and chronic respiratory failure with hypoxia: Secondary | ICD-10-CM | POA: Insufficient documentation

## 2014-01-31 DIAGNOSIS — R079 Chest pain, unspecified: Secondary | ICD-10-CM

## 2014-01-31 DIAGNOSIS — J9611 Chronic respiratory failure with hypoxia: Secondary | ICD-10-CM | POA: Insufficient documentation

## 2014-01-31 DIAGNOSIS — J8410 Pulmonary fibrosis, unspecified: Secondary | ICD-10-CM

## 2014-01-31 DIAGNOSIS — R0902 Hypoxemia: Secondary | ICD-10-CM

## 2014-01-31 DIAGNOSIS — G4733 Obstructive sleep apnea (adult) (pediatric): Secondary | ICD-10-CM

## 2014-01-31 DIAGNOSIS — J9610 Chronic respiratory failure, unspecified whether with hypoxia or hypercapnia: Secondary | ICD-10-CM

## 2014-01-31 DIAGNOSIS — J84112 Idiopathic pulmonary fibrosis: Secondary | ICD-10-CM

## 2014-01-31 NOTE — Assessment & Plan Note (Signed)
2 L per minute of oxygen at night and each bedtime, 3 L with exertion

## 2014-01-31 NOTE — Telephone Encounter (Signed)
Kaitlin Hamilton w/ [facility]'s office w/ [facility] is calling requesting recent CT Chest results >> referral was placed at the 6.5.15 ov (verified) Kaitlin Hamilton stated okay to send electronically via biscom Last CT's were done April and September 2014 CT's sent to [facility] whom will call back if the scans aren't received to have them paper faxed  Nothing further needed; will sign off.

## 2014-01-31 NOTE — Assessment & Plan Note (Signed)
Continue CPAP each bedtime  We will check an overnight oximetry test

## 2014-01-31 NOTE — Patient Instructions (Signed)
Take aspirin daily (81mg ) Use your oxygen 24 hours a day Keep taking your medicines as you are doing We will refer you to a cardiologist this week for the chest pain If the chest pain lasts more than 15 minutes then go to the ER Cut the trazodone pills in half We will see you back in 6-8 weeks or sooner if needed

## 2014-01-31 NOTE — Assessment & Plan Note (Signed)
As noted in multiple prior notes she has a steroid responsive form of UIP. I am disappointed that it seems that her hypoxemia has progressed somewhat despite now being appropriate doses of CellCept.  It's not entirely clear to me that her disease has progressed as certainly her obesity contributes to her restrictive lung disease and deconditioning.  Some anxiety may also be contributing.  I am reluctant to push the dose of prednisone anymore as she had such severe side effects to this back in January.  Plan: -She will see [facility] interstitial lung disease in July for a second opinion.  My primary question for them is whether or not I should be treating this as idiopathic pulmonary fibrosis. A second question would be whether or not an alternative immunosuppressive regimen should be used. -Continue CellCept -Continue prednisone -Continue dapsone

## 2014-01-31 NOTE — Progress Notes (Addendum)
Subjective:    Patient ID: Kaitlin Hamilton, female    DOB: 04-23-1951, 63 y.o.   MRN: [id number]  Synopsis: Kaitlin Hamilton is a very pleasant 63 year old female who first saw the [facility] pulmonary clinic in March 2014 for evaluation of shortness of breath. She had significant cough, wheezing, and sputum production requiring multiple rounds of antibiotics. Because of crackles on lung exam and an abnormal pulmonary function test she was referred to [place]. We ordered full pulmonary function testing which showed moderate restriction and a depressed DLCO in proportion to her restriction. There is no airflow obstruction and no change with bronchodilator administration. A chest x-ray performed in February 2014 was read as normal.  An open lung biopsy was performed in December 2014 showing UIP.  Because she has had multiple good responses to steroids, we have treated her with cellcept and prednisone.  In January 2015 she had severe insomnia, hallucinations and gastritis on high dose prednisone.  She had a rash to bactrim. Since February 2015 she has been on cellcept and low dose prednisone and dapsone.  HPI  01/31/2014 ROV > Kaitlin Hamilton feels like she isn't breathing as well as before.  She fels like there is a heaviness in her head and there is sinus congestion. This is worse when she goes out on errands and such.  She feels heavy in her head.  Her oxygen level will drop occasionally, but never lower than about 84%  She has been coughing more, it is dry.  n omucus production.  When she eats her coughing is worse.  She has some pai in her chest which will occasionally will radiate to her left arm and shoulder.  She describes a sharp, stabbing pain.  This occurs at rest and when she is up and moving around.  It has happened when she is getting out of the shower and off of oxygen.   Past Medical History  Diagnosis Date  . Bronchitis   . Chicken pox   . Depression   . Allergic rhinitis     uses Flonase daily  .  Hyperlipidemia     lost 43 pounds and no meds required at present  . Hypokalemia   . Anxiety   . PONV (postoperative nausea and vomiting)   . Hypertension     takes Verapamil and HCTZ daily  . Shortness of breath     with exertion;takes Singulair daily as well as Flonase  . Pneumonia     last time in 2006  . History of bronchitis     2013  . H/O hiatal hernia   . Migraines     last one about a month ago  . Dysphagia   . Joint swelling     left thumb  . OA (osteoarthritis)     left knee  . GERD (gastroesophageal reflux disease)     takes Protonix daily  . Hemorrhoids   . History of colon polyps   . Diverticulosis   . Urinary frequency   . History of kidney stones   . Non-insulin dependent type 2 diabetes mellitus     takes Glipizide daily  . Constipation     takes Fiber daily  . Hypothyroidism (acquired)     takes Synthroid daily  . Insomnia     doesn't take any meds for this        Review of Systems  Constitutional: Negative for fever, chills, activity change and fatigue.  HENT: Negative for congestion, ear pain, nosebleeds, postnasal  drip, rhinorrhea and sinus pressure.   Respiratory: Positive for shortness of breath. Negative for cough and wheezing.   Cardiovascular: Negative for chest pain, palpitations and leg swelling.  Gastrointestinal: Negative for nausea and abdominal pain.       Objective:   Physical Exam   Filed Vitals:   01/31/14 1524  BP: 128/66  Height: '5\' 6"'  (1.676 m)  Weight: 117.028 kg (258 lb)  SpO2: 94%  2L [place]  She dropped to 83% with ambulation on 2 L, this improved to 93-95% with 3 L on exertion  Gen:  Anxious, no acute distress  HEENT: NCAT,  EOMi, OP clear,  PULM: Crackles 1/2 way up bilaterally CV: RRR, no mgr, no JVD AB: BS+, soft, nontender, no hsm Ext: warm, no edema, no clubbing Neuro: A&Ox4, maew  February 2014 simple spirometry performed by her primary care physician>> ratio 90%, FEV1 1.71 L (64% predicted, FVC 1.83  L 55% predicted; flow volume loop is not consistent with obstruction February 2014 chest x-ray at [facility] normal 10/19/2012 walked 500 feet in office on room air oxygenation did not drop below 90% 11/15/2012 Full PFT [name]> Ratio 87%, FEV1 2.00L > 2.07 with bronchodilator (3% change); TLC 3.44 L (65% pred), ERV 0.62 (57% pred), DLCO 15.1 ml/mmHg/min (59% pred) 11/2012 CT chest >> ([name] read) centrilobular nodules, interlobular septal thickening worse in bases and periphery R lung > L; some GGO in bases and periphery as well, some bronchiectasis in the bases R > L; findings suggestive of fibrosis but not UIP; question hypersensitivity pneumonitis given centrilobular nodules; also question aspiration 03/2013 ANA, ANCA, Anti-Jo-1, ESR, RF, SCL-70, anti-centromere, SSA/SSB, all negative; CRP 0.6;  04/2013 Barium swallow> abnormal esophageal motility, GERD, hiatal hernia 04/2013 Full PFT> Ratio 93%, FEV1 1.92 L (71% pred), TLC 3.02L (56% pred), DLCO 15.94 (59% pred) 04/2013 CT chest ([name])> findings suggestive of but not diagnostic of NSIP, small pulmonary nodule 04/2013 6MW RA > 1100 feet, HR peak 109, O2 sat Nadir 87% 04/2013 HP panel >> 05/17/2013 3 month prednisone trial 06/2013 open lung biopsy> UIP 07/28/13 Cell cept and bactrim started > bactrim stopped 07/29/13 due to rash  07/2013 hospitalized for confusion > prednisone 07/2013 hospitalized for gastritis/abdominal pain> prednisone 08/22/2013 atovaquone caused rash 11/2013 6MW 1364 feet, O2 saturation nadir 78% May 2015 full pulmonary function test ratio 93%, FEV1 1.71 L (64% predicted, no change with bronchodilator), total lung capacity 2.67 L (49% predicted), DLCO 13.11 (40% predicted)      Assessment & Plan:   UIP (usual interstitial pneumonitis) As noted in multiple prior notes she has a steroid responsive form of UIP. I am disappointed that it seems that her hypoxemia has progressed somewhat despite now being appropriate doses of  CellCept.  It's not entirely clear to me that her disease has progressed as certainly her obesity contributes to her restrictive lung disease and deconditioning.  Some anxiety may also be contributing.  I am reluctant to push the dose of prednisone anymore as she had such severe side effects to this back in January.  Plan: -She will see [facility] interstitial lung disease in July for a second opinion.  My primary question for them is whether or not I should be treating this as idiopathic pulmonary fibrosis. A second question would be whether or not an alternative immunosuppressive regimen should be used. -Continue CellCept -Continue prednisone -Continue dapsone  OSA (obstructive sleep apnea) Continue CPAP each bedtime  We will check an overnight oximetry test  Chronic hypoxemic respiratory failure 2 L per minute of oxygen at night and each bedtime, 3 L with exertion  Chest pain Cardiology consult    Updated Medication List Outpatient Encounter Prescriptions as of 01/31/2014  Medication Sig  . acetaminophen (TYLENOL) 500 MG tablet Take 1,000 mg by mouth every 6 (six) hours as needed for moderate pain.  . Azelastine-Fluticasone (DYMISTA) 137-50 MCG/ACT SUSP One puff per nostril twice daily  . dapsone 100 MG tablet Take 1 tablet (100 mg total) by mouth daily.  . diphenhydrAMINE (BENADRYL) 25 mg capsule Take 25 mg by mouth as needed.  Marland Kitchen FIBER PO Take 1 tablet by mouth daily.  Marland Kitchen glipiZIDE (GLUCOTROL) 10 MG tablet Take 1 tablet (10 mg total) by mouth 2 (two) times daily before a meal.  . hydrocortisone cream 0.5 % Apply 1 application topically 2 (two) times daily as needed (for rash).  Marland Kitchen levothyroxine (SYNTHROID, LEVOTHROID) 125 MCG tablet Take 125 mcg by mouth daily before breakfast.  . losartan-hydrochlorothiazide (HYZAAR) 50-12.5 MG per tablet Take 1 tablet by mouth daily.  . montelukast (SINGULAIR) 10 MG tablet Take 10 mg by mouth at bedtime.  . mycophenolate  (CELLCEPT) 500 MG tablet Take 1529m qam, 1009mqhs  . predniSONE (DELTASONE) 10 MG tablet Take 1 tablet (10 mg total) by mouth daily with breakfast.  . Probiotic Product (PROBIOTIC DAILY PO) Take 2 capsules by mouth 2 (two) times daily. Probiotic 10  . verapamil (VERELAN PM) 180 MG 24 hr capsule Take 180 mg by mouth daily.  . traMADol-acetaminophen (ULTRACET) 37.5-325 MG per tablet Take 1 tablet by mouth every 6 (six) hours as needed.  . traZODone (DESYREL) 50 MG tablet Take 50 mg by mouth at bedtime as needed.  . [DISCONTINUED] pantoprazole (PROTONIX) 40 MG tablet Take 1 tablet (40 mg total) by mouth 2 (two) times daily.

## 2014-02-03 ENCOUNTER — Other Ambulatory Visit (INDEPENDENT_AMBULATORY_CARE_PROVIDER_SITE_OTHER): Payer: BC Managed Care – PPO

## 2014-02-03 DIAGNOSIS — E7800 Pure hypercholesterolemia, unspecified: Secondary | ICD-10-CM

## 2014-02-03 DIAGNOSIS — E119 Type 2 diabetes mellitus without complications: Secondary | ICD-10-CM

## 2014-02-03 LAB — LIPID PANEL
CHOL/HDL RATIO: 4
Cholesterol: 230 mg/dL — ABNORMAL HIGH (ref 0–200)
HDL: 57.6 mg/dL (ref >39.00)
LDL CALC: 143 mg/dL — AB (ref 0–99)
NONHDL: 172.4
Triglycerides: 146 mg/dL (ref 0.0–149.0)
VLDL: 29.2 mg/dL (ref 0.0–40.0)

## 2014-02-03 LAB — HEMOGLOBIN A1C: Hgb A1c MFr Bld: 4 % — ABNORMAL LOW (ref 4.6–6.5)

## 2014-02-09 ENCOUNTER — Telehealth: Payer: Self-pay

## 2014-02-09 ENCOUNTER — Telehealth: Payer: Self-pay | Admitting: Pulmonary Disease

## 2014-02-09 DIAGNOSIS — R079 Chest pain, unspecified: Secondary | ICD-10-CM | POA: Insufficient documentation

## 2014-02-09 DIAGNOSIS — J9611 Chronic respiratory failure with hypoxia: Secondary | ICD-10-CM

## 2014-02-09 NOTE — Telephone Encounter (Signed)
New message    7/23 DOD -Dr. Lovena Le  S/w Dr. [name] regarding patient .   Per nurse Claiborne Billings patient need to be seen tomorrow 7/24   Spoke with Rip Harbour Dr. Mare Ferrari nurse who DOD on  7/24 to add patient to schedule at  8:45 appt .to have patient arrive @ 8:30 am .    Call patient at phone number that list no answer . Inform nurse that patient has appt tomorrow at [facility]  7/24 @ 8:30 with Amy Bedsole.     S/w Rip Harbour to document call to patient - route message to her for review .

## 2014-02-09 NOTE — Telephone Encounter (Signed)
I'm not sure what happened there Please order immediately: ONO; cardiology consult > chest pain; POC from DME

## 2014-02-09 NOTE — Telephone Encounter (Signed)
Return call.Kaitlin Hamilton °

## 2014-02-09 NOTE — Telephone Encounter (Signed)
Per OV 01/31/14: Patient Instructions      Take aspirin daily (81mg ) Use your oxygen 24 hours a day Keep taking your medicines as you are doing We will refer you to a cardiologist this week for the chest pain If the chest pain lasts more than 15 minutes then go to the ER Cut the trazodone pills in half We will see you back in 6-8 weeks or sooner if needed  ---  Called spoke with pt. She reports she was suppose to have 3 things done at last OV 1) referral to cards to get an ASAP appt 2) get scheduled for sleep study 3) order to change DME so she can get POC.  None of this has been ordered in epic. I advised pt will send message to BQ advising on which cards he would lie pt referred to as well. She voiced her understanding.s he wants all of this taken care of today. BQ is in 3 pm elink. Please advise thanks

## 2014-02-09 NOTE — Telephone Encounter (Signed)
Orders have been placed. Pt is aware. Nothing further needed

## 2014-02-09 NOTE — Assessment & Plan Note (Signed)
Cardiology consult

## 2014-02-09 NOTE — Telephone Encounter (Signed)
Called pt and LMTCB x1 for pt I do not see anything about a stress test from last OV

## 2014-02-09 NOTE — Telephone Encounter (Signed)
Kaitlin Hamilton has noted increasing dyspnea and chest pain recently.  She does not have a history of coronary artery disease.  Will request cardiology evaluation.

## 2014-02-10 ENCOUNTER — Ambulatory Visit: Payer: BC Managed Care – PPO | Admitting: Family Medicine

## 2014-02-10 NOTE — Telephone Encounter (Signed)
Spoke with patient and offered appointment this morning and she depends on a ride so was not able to come it. Did discuss chest pains and need for appointment today with patient and she stated that she only had them one time and did not think she needed to be seen at all but would do what was recommended. Scheduled her an appointment for Tuesday July 28 and advised to go to ED if chest pains returned. Patient verbalized understanding.

## 2014-02-14 ENCOUNTER — Ambulatory Visit (INDEPENDENT_AMBULATORY_CARE_PROVIDER_SITE_OTHER): Payer: BC Managed Care – PPO | Admitting: Cardiology

## 2014-02-14 ENCOUNTER — Encounter: Payer: Self-pay | Admitting: Cardiology

## 2014-02-14 VITALS — BP 134/78 | HR 81 | Ht 66.0 in | Wt 258.8 lb

## 2014-02-14 DIAGNOSIS — J84112 Idiopathic pulmonary fibrosis: Secondary | ICD-10-CM

## 2014-02-14 DIAGNOSIS — J8410 Pulmonary fibrosis, unspecified: Secondary | ICD-10-CM

## 2014-02-14 DIAGNOSIS — R079 Chest pain, unspecified: Secondary | ICD-10-CM

## 2014-02-14 DIAGNOSIS — R0789 Other chest pain: Secondary | ICD-10-CM

## 2014-02-14 DIAGNOSIS — I119 Hypertensive heart disease without heart failure: Secondary | ICD-10-CM

## 2014-02-14 DIAGNOSIS — E1165 Type 2 diabetes mellitus with hyperglycemia: Secondary | ICD-10-CM

## 2014-02-14 DIAGNOSIS — IMO0001 Reserved for inherently not codable concepts without codable children: Secondary | ICD-10-CM

## 2014-02-14 NOTE — Progress Notes (Signed)
Kaitlin Hamilton Date of Birth:  06/27/1951 [place] [address] [place], [place]  [address] [phone]        Fax   [phone]   History of Present Illness: This pleasant 63 year old woman is seen by me for the first time today.  She is a medical patient of Dr. [name].  We were asked to see her I her pulmonologist Dr. [name] in regard to chest pain.  The patient last saw Dr. [name] on 02/03/14.  During that office visit she mentioned that a week or 2 prior she experienced some left upper chest discomfort with radiation to the left arm.  She does not have any history of known ischemic heart disease.  The chest discomfort was atypical.  It was a sharp stabbing pain of brief duration.  Not associated with exertion.  It has not recurred over the past several weeks. The patient has multiple risk factors for premature coronary disease including hypertension, hypercholesterolemia, and diabetes.  Her family history does not reveal premature coronary disease however.  Her father died of alcoholic cirrhosis and burn injuries at age 66 and her mother died of rheumatoid arthritis at age 72.  The patient is a nonsmoker.  She is followed closely by pulmonary because of UIP(usual interstitial pneumonitis).  She is on CellCept and prednisone.  She has an appointment to be seen at [facility] on Thursday, July 30.  She has a history of obstructive sleep apnea and uses a CPAP machine.  She sleeps in a recliner.  She has not been experiencing any peripheral edema.  She is dependent on nasal oxygen around-the-clock.  Current Outpatient Prescriptions  Medication Sig Dispense Refill  . acetaminophen (TYLENOL) 500 MG tablet Take 1,000 mg by mouth every 6 (six) hours as needed for moderate pain.      Marland Kitchen aspirin EC 81 MG tablet Take 81 mg by mouth daily.      . Azelastine-Fluticasone (DYMISTA) 137-50 MCG/ACT SUSP One puff per nostril twice daily  23 g  5  . dapsone 100 MG tablet Take 1 tablet (100 mg  total) by mouth daily.  30 tablet  3  . diphenhydrAMINE (BENADRYL) 25 mg capsule Take 25 mg by mouth as needed.      Marland Kitchen FIBER PO Take 1 tablet by mouth daily.      Marland Kitchen glipiZIDE (GLUCOTROL) 10 MG tablet Take 1 tablet (10 mg total) by mouth 2 (two) times daily before a meal.  60 tablet  5  . hydrocortisone cream 0.5 % Apply 1 application topically 2 (two) times daily as needed (for rash).      Marland Kitchen ibuprofen (ADVIL,MOTRIN) 200 MG tablet Take 200 mg by mouth as needed.      Marland Kitchen levothyroxine (SYNTHROID, LEVOTHROID) 125 MCG tablet Take 125 mcg by mouth daily before breakfast.      . losartan-hydrochlorothiazide (HYZAAR) 50-12.5 MG per tablet Take 1 tablet by mouth daily.  30 tablet  0  . montelukast (SINGULAIR) 10 MG tablet Take 10 mg by mouth at bedtime.      . mycophenolate (CELLCEPT) 500 MG tablet Take 1500mg  qam, 1000mg  qhs  450 tablet  1  . NON FORMULARY Place 2 L into the nose daily. 3 Liters with exertion      . pantoprazole (PROTONIX) 40 MG tablet Take 40 mg by mouth 2 (two) times daily.       . predniSONE (DELTASONE) 10 MG tablet Take 1 tablet (10 mg total) by  mouth daily with breakfast.  30 tablet  5  . traMADol-acetaminophen (ULTRACET) 37.5-325 MG per tablet Take 1 tablet by mouth every 6 (six) hours as needed.  30 tablet  0  . traZODone (DESYREL) 50 MG tablet Take 50 mg by mouth at bedtime as needed.      . verapamil (VERELAN PM) 180 MG 24 hr capsule Take 180 mg by mouth daily.       No current facility-administered medications for this visit.    Allergies  Allergen Reactions  . Atovaquone Itching  . Codeine Hives and Swelling  . Demerol [Meperidine] Hives and Swelling  . Hydrocodone Nausea Only  . Sulfa Antibiotics Rash    Patient Active Problem List   Diagnosis Date Noted  . Chest pain of uncertain etiology 56/81/2751  . Chest pain 02/09/2014  . Chronic hypoxemic respiratory failure 01/31/2014  . Microalbuminuria 11/11/2013  . Cystocele 11/11/2013  . HTN (hypertension)  08/25/2013  . High cholesterol 08/25/2013  . Common migraine 08/25/2013  . Unspecified hypothyroidism 08/25/2013  . OSA (obstructive sleep apnea) 08/22/2013  . Metabolic encephalopathy 70/07/7492  . Type II or unspecified type diabetes mellitus without mention of complication, not stated as uncontrolled 08/01/2013  . Abdominal pain, epigastric 07/27/2013  . GERD (gastroesophageal reflux disease) 06/01/2013  . Esophageal dysmotility 05/17/2013  . UIP (usual interstitial pneumonitis) 10/19/2012    History  Smoking status  . Never Smoker   Smokeless tobacco  . Never Used    History  Alcohol Use No    Family History  Problem Relation Age of Onset  . Rheum arthritis Mother   . Rheum arthritis Sister   . Prostate cancer Brother   . Heart disease Maternal Grandmother   . Heart disease Maternal Grandfather   . Uterine cancer Daughter   . Colon cancer Neg Hx     Review of Systems: Constitutional: no fever chills diaphoresis or fatigue or change in weight.  Head and neck: no hearing loss, no epistaxis, no photophobia or visual disturbance. Respiratory: Oxygen dependent secondary to UIP Cardiovascular: No chest pain peripheral edema, palpitations. Gastrointestinal: No abdominal distention, no abdominal pain, no change in bowel habits hematochezia or melena. Genitourinary: No dysuria, no frequency, no urgency, no nocturia. Musculoskeletal:No arthralgias, no back pain, no gait disturbance or myalgias. Neurological: No dizziness, no headaches, no numbness, no seizures, no syncope, no weakness, no tremors. Hematologic: No lymphadenopathy, no easy bruising. Psychiatric: No confusion, no hallucinations, no sleep disturbance.    Physical Exam: Filed Vitals:   02/14/14 1107  BP: 134/78  Pulse: 81   the general appearance reveals a large middle-aged woman in no distress.  Nasal oxygen in place.The head and neck exam reveals pupils equal and reactive.  Extraocular movements are full.   There is no scleral icterus.  The mouth and pharynx are normal.  The neck is supple.  The carotids reveal no bruits.  The jugular venous pressure is normal.  The  thyroid is not enlarged.  There is no lymphadenopathy.  The chest is clear to percussion. There are fine inspiratory rales at the bases.  Expansion of the chest is symmetrical.  The precordium is quiet.  The first heart sound is normal.  The second heart sound is physiologically split.  There is no murmur gallop rub or click.  There is no abnormal lift or heave.  The abdomen is soft and nontender.  The bowel sounds are normal.  The liver and spleen are not enlarged.  There are no abdominal  masses.  There are no abdominal bruits.  Extremities reveal good pedal pulses.  There is no phlebitis or edema.  There is no cyanosis or clubbing.  Strength is normal and symmetrical in all extremities.  There is no lateralizing weakness.  There are no sensory deficits.  The skin is warm and dry.  There is no rash.  EKG shows normal sinus rhythm and is within normal limits, no significant change since previous tracing of 08/08/13.   Assessment / Plan: 1.  Atypical chest pain 2. oxygen-dependent UIP with chronic dyspnea 3.  Obesity 4. essential hypertension without heart failure 5. history of hypercholesterolemia 6. diabetes mellitus  Plan: At this point we will follow the patient.  She has had no further episodes of chest discomfort.  We'll get an echocardiogram to look at left ventricular function and wall motion abnormalities.  Depending on clinical course we may wish to at a later date have her return for a Myoview stress test.  This would need to be a 2 day study because of body habitus.  She would not be able to walk on the treadmill so we will use LexiScan.  Although she has severe pulmonary insufficiency, she is not having any wheezing or airway obstruction.  I have asked her to let me recheck her for another office visit and EKG in 4-6 weeks.  In the  meantime she will be going to [facility] the day after tomorrow for further evaluation of her pulmonary status.  Many thanks for the opportunity to see this pleasant woman with you.  The patient will continue her current medication including baby aspirin daily.

## 2014-02-14 NOTE — Patient Instructions (Addendum)
Your physician recommends that you continue on your current medications as directed. Please refer to the Current Medication list given to you today.  Your physician recommends that you schedule a follow-up appointment in: 4-6 weeks  Your physician has requested that you have an echocardiogram. Echocardiography is a painless test that uses sound waves to create images of your heart. It provides your doctor with information about the size and shape of your heart and how well your heart's chambers and valves are working. This procedure takes approximately one hour. There are no restrictions for this procedure.

## 2014-02-20 ENCOUNTER — Telehealth: Payer: Self-pay | Admitting: Pulmonary Disease

## 2014-02-20 ENCOUNTER — Other Ambulatory Visit (INDEPENDENT_AMBULATORY_CARE_PROVIDER_SITE_OTHER): Payer: BC Managed Care – PPO

## 2014-02-20 DIAGNOSIS — J8410 Pulmonary fibrosis, unspecified: Secondary | ICD-10-CM

## 2014-02-20 DIAGNOSIS — J9611 Chronic respiratory failure with hypoxia: Secondary | ICD-10-CM

## 2014-02-20 DIAGNOSIS — J84112 Idiopathic pulmonary fibrosis: Secondary | ICD-10-CM

## 2014-02-20 LAB — COMPREHENSIVE METABOLIC PANEL
ALBUMIN: 4.2 g/dL (ref 3.5–5.2)
ALT: 18 U/L (ref 0–35)
AST: 20 U/L (ref 0–37)
Alkaline Phosphatase: 62 U/L (ref 39–117)
BUN: 18 mg/dL (ref 6–23)
CALCIUM: 9.2 mg/dL (ref 8.4–10.5)
CO2: 25 mEq/L (ref 19–32)
CREATININE: 0.8 mg/dL (ref 0.4–1.2)
Chloride: 102 mEq/L (ref 96–112)
GFR: 76.94 mL/min (ref >60.00)
Glucose, Bld: 152 mg/dL — ABNORMAL HIGH (ref 70–99)
Potassium: 3.5 mEq/L (ref 3.5–5.1)
SODIUM: 135 meq/L (ref 135–145)
Total Bilirubin: 0.9 mg/dL (ref 0.2–1.2)
Total Protein: 7 g/dL (ref 6.0–8.3)

## 2014-02-20 NOTE — Telephone Encounter (Signed)
Called dawne LMTCB x1

## 2014-02-20 NOTE — Telephone Encounter (Signed)
Spoke with Dawne from [place]. She reports when they spoke with pt, she was told when pt saw [facility] on Thursday they changed her O2 liter flow. Pt was told to use 6 l/m w/ exertion. In care everywhere pt had 48mw. Notes states: Physician Interp: Patient exercised by walking for 7.5 minutes without incident. Pulse oximetry assessments every 30 seconds were used to adjust supplemental O2. Supplemental oxygen at 6 LPM was required to maintain O2 Saturation >88% with exercise. At peak exercise, the heart rate indicated cardiovascular maximums were being approached. Perceived dyspnea at end of exercise was moderate. Georgia Lopes MD --  If this is so pt is not eligible for POC then. Please advise Dr. [name] thanks

## 2014-02-21 ENCOUNTER — Ambulatory Visit: Payer: BC Managed Care – PPO

## 2014-02-21 ENCOUNTER — Ambulatory Visit: Payer: BC Managed Care – PPO | Admitting: Cardiovascular Disease

## 2014-02-21 NOTE — Telephone Encounter (Signed)
Pt aware of BQ recs.  Order placed. Pt coming to have sats done today. Will forward message to [facility] to place dme referral once qualified.

## 2014-02-21 NOTE — Telephone Encounter (Signed)
Dr [name],   Pt is wanting to know if she needs to have another ONO done? States that she was told at last OV 01/31/14 that we may need to repeat ONO and she states that she has not heard anything further of this?? If so, any specifications.   Also, [facility] is telling pt that she needs to increase her CellCept another 500mg  mg daily -- pt states that Dr Gwynneth Munson at [facility] advised her that with her type of lung disease she needs to be on an increased dose of Cellcept NOT Prednisone. Pt is concerned with the fact that Dr [name] and Dr Moorison's treatment plans are so different. Pt requesting that Dr [name] contact herself or [facility] and speak with Dr Gwynneth Munson. Pt very frustrated, stating that she feels she is getting pulled in many different directions between [place] and [place].

## 2014-02-21 NOTE — Telephone Encounter (Signed)
Pt called, stated that she had gone to the [place] office for her walk today because she thought that the walk was being done there.  I apologized and explained that pulmonary is not in that office today, and that the nurses and staff there today are only primary providers and did not directly work with [place].  She was also confused as to why she needed this walk.  I explained to her that when [facility] did her 79mw, it showed that she needed 6lpm but did not specify if this was pulse or continuous.  I also explained that if she truly needs 6lpm continuous, there was no POC that went that high, which is what she wants.  I explained how we wanted to redo the walk to see if she could qualify for the POC's available to her.  I spoke in person with Dawne from [place] who said that the highest POC they have available is 3lpm continuous or 5plm pulse.  She understands this and will be coming to the [facility] office tomorrow morning to do this walk.  Pt also asked if BQ had spoken with Dr. Randol Kern about her visit on Thursday.  I advised pt that BQ is not in the office this week and while I had no info on this interaction, I would ask if there was any news on this yet.    Dr [name], have you spoken with Dr. Randol Kern about [name] yet? Thank you.

## 2014-02-21 NOTE — Telephone Encounter (Addendum)
Spoke with Dawne-- Aware that order to be placed for POC once our office repeats oxygen qualification testing. Per Dawne, they do not offer a POC with levels higher than 5L pulsed--need to recheck to make sure that she "needs" 6LPM, which is what [facility] has documented during 6MW 02/18/14. Pt would be required to use a tanks if this level is in fact correct. Advised Dawne that I would contact the patient and speak with her about all of this. Pt aware that we do not have qualifying sats of O2 @ RA at rest.  Pt to come by today (btw 2-5) to have O2 checked RA at rest so that we can complete our qualifying sats documentation  There is no documentation within [facility]'s 6MW of what type of O2 dose used-- Pulsed? Continuous? Pt is going to be re-qualified in our office today.   Spoke with Caryl Pina - Dr Anastasia Pall nurse- aware that the patient is coming in for qualifying sats  APS aware that order will be sent once this is completed.  Will send to Caryl Pina-- order is pending in chart

## 2014-02-21 NOTE — Telephone Encounter (Signed)
I am waiting for Dr. Dennard Nip note to come through, so tell her to keep her current dose until she sees me or hears otherwise from me  ONO is OK, please order

## 2014-02-21 NOTE — Telephone Encounter (Signed)
Yes I talked to Dr. Randol Kern last week but I don't specifically remember him telling me to increase the cellcept. That is why I am waiting on his note

## 2014-02-21 NOTE — Telephone Encounter (Signed)
Called and spoke with pt and she is aware of BQ recs. Nothing further is needed.  

## 2014-02-21 NOTE — Telephone Encounter (Signed)
OK, then send an Rx to her home health agency to match this

## 2014-02-22 ENCOUNTER — Ambulatory Visit: Payer: BC Managed Care – PPO

## 2014-02-22 ENCOUNTER — Telehealth: Payer: Self-pay

## 2014-02-22 DIAGNOSIS — J9611 Chronic respiratory failure with hypoxia: Secondary | ICD-10-CM

## 2014-02-22 NOTE — Telephone Encounter (Signed)
OK by me for 3LPM POC

## 2014-02-22 NOTE — Telephone Encounter (Signed)
Dr. [name],  Kaitlin Hamilton came in for her 02 qualifying walk today.  You can see all the documentation under her encounter with [name] dated for 02/22/14.  My walk with her showed that she needs 3lpm continuous 02, which is what her walk on 7/14 showed.  I also talked to [name] about her visit at [facility], and have a printout of her AVS for your records here at the office.  She states that she had her PFT done and immediately did her 6 minute walk upon completion.  She states she was pretty short of breath before beginning her 6 minute walk.   Do you want me to put in another order for the POC 3lpm continuous for her?  Thanks, Caryl Pina

## 2014-02-23 NOTE — Telephone Encounter (Signed)
Order placed, nothing further needed.  Also, FYI, we received a fax from Dr. Randol Kern going more in detail about Kirrah's recommended course or treatment from his perspective.

## 2014-02-24 ENCOUNTER — Other Ambulatory Visit: Payer: Self-pay | Admitting: *Deleted

## 2014-02-24 MED ORDER — DAPSONE 100 MG PO TABS: 100.0000 mg | ORAL_TABLET | Freq: Every day | ORAL | Status: DC

## 2014-02-27 ENCOUNTER — Other Ambulatory Visit: Payer: Self-pay | Admitting: *Deleted

## 2014-02-27 ENCOUNTER — Ambulatory Visit (HOSPITAL_COMMUNITY): Payer: BC Managed Care – PPO | Attending: Cardiology

## 2014-02-27 ENCOUNTER — Telehealth: Payer: Self-pay | Admitting: Pulmonary Disease

## 2014-02-27 DIAGNOSIS — I079 Rheumatic tricuspid valve disease, unspecified: Secondary | ICD-10-CM | POA: Insufficient documentation

## 2014-02-27 DIAGNOSIS — R079 Chest pain, unspecified: Secondary | ICD-10-CM

## 2014-02-27 DIAGNOSIS — R0789 Other chest pain: Secondary | ICD-10-CM

## 2014-02-27 MED ORDER — GLIPIZIDE 10 MG PO TABS: 10.0000 mg | ORAL_TABLET | Freq: Two times a day (BID) | ORAL | Status: DC

## 2014-02-27 NOTE — Telephone Encounter (Signed)
Order was faxed to [facility]. Will send [facility] A STAFF MESSAGE. NOTHING FURTHER NEEDED

## 2014-02-27 NOTE — Progress Notes (Signed)
2D Echo completed. 02/27/2014

## 2014-02-28 ENCOUNTER — Encounter: Payer: Self-pay | Admitting: Pulmonary Disease

## 2014-03-01 ENCOUNTER — Telehealth: Payer: Self-pay

## 2014-03-01 MED ORDER — MYCOPHENOLATE MOFETIL 500 MG PO TABS: 1500.0000 mg | ORAL_TABLET | Freq: Two times a day (BID) | ORAL | Status: DC

## 2014-03-01 NOTE — Telephone Encounter (Signed)
Message copied by Len Blalock on Wed Mar 01, 2014 12:34 PM ------      Message from: Kaitlin Hamilton      Created: Tue Feb 28, 2014 11:29 PM       A,            Please let her know that I have read Dr. Dennard Nip note.  He did want her to increase her dose of CellCept to 1500mg  twice a day.  I want her to do this immediately.  You may need to call her daughter to make sure this happens.            Thanks      [employer] ------

## 2014-03-01 NOTE — Telephone Encounter (Signed)
Spoke with pt, she is aware of results and recs.  I advised her to have her daughter call the office if they have any concerns about raising the cellcept.  I sent in a new rx for the Cellcept 1500mg  bid.  Nothing else needed at this time.

## 2014-03-02 ENCOUNTER — Telehealth: Payer: Self-pay | Admitting: Pulmonary Disease

## 2014-03-02 NOTE — Telephone Encounter (Signed)
OK 

## 2014-03-02 NOTE — Telephone Encounter (Signed)
Per Lowella Curb for [place]. Pt refused POC eval.

## 2014-03-07 ENCOUNTER — Other Ambulatory Visit: Payer: Self-pay

## 2014-03-07 NOTE — Telephone Encounter (Signed)
Pt said losartan HCTZ [facility]; pt wants to know if on automatic refill; advised pt I did not know she would need to contact pharmacy. Pt will contact pharmacy and cb if needed.

## 2014-03-14 ENCOUNTER — Ambulatory Visit (INDEPENDENT_AMBULATORY_CARE_PROVIDER_SITE_OTHER): Payer: BC Managed Care – PPO | Admitting: Family Medicine

## 2014-03-14 ENCOUNTER — Encounter: Payer: Self-pay | Admitting: Family Medicine

## 2014-03-14 VITALS — BP 120/70 | HR 96 | Temp 98.1°F | Ht 66.0 in | Wt 258.8 lb

## 2014-03-14 DIAGNOSIS — E119 Type 2 diabetes mellitus without complications: Secondary | ICD-10-CM

## 2014-03-14 DIAGNOSIS — R0789 Other chest pain: Secondary | ICD-10-CM

## 2014-03-14 DIAGNOSIS — G43009 Migraine without aura, not intractable, without status migrainosus: Secondary | ICD-10-CM

## 2014-03-14 DIAGNOSIS — E7800 Pure hypercholesterolemia, unspecified: Secondary | ICD-10-CM

## 2014-03-14 DIAGNOSIS — I10 Essential (primary) hypertension: Secondary | ICD-10-CM

## 2014-03-14 LAB — HM DIABETES FOOT EXAM

## 2014-03-14 NOTE — Patient Instructions (Addendum)
Work on low carb, low fat diet.  Water instead of sweet beverages.  Start red yeast rice 600 mg 2 tabs twice daily. OTC.  Work on increasing exercise as tolerated.

## 2014-03-14 NOTE — Assessment & Plan Note (Signed)
Stable

## 2014-03-14 NOTE — Assessment & Plan Note (Signed)
Improving bu not at goal < 130. Start red yeast rice. Work on [employer], weight loss and exercise as tolerated.

## 2014-03-14 NOTE — Progress Notes (Signed)
Pre visit review using our clinic review tool, if applicable. No additional management support is needed unless otherwise documented below in the visit note. 

## 2014-03-14 NOTE — Progress Notes (Signed)
Subjective:    Patient ID: Kaitlin Hamilton, female    DOB: [birth year], 63 y.o.   MRN: [id number]  Diabetes Hypoglycemia symptoms include headaches. Associated symptoms include fatigue. Pertinent negatives for diabetes include no chest pain.  Hyperlipidemia Associated symptoms include shortness of breath. Pertinent negatives include no chest pain.  Hypertension Associated symptoms include headaches and shortness of breath. Pertinent negatives include no chest pain or palpitations.  Headache  Pertinent negatives include no abdominal pain, ear pain, eye pain or fever. Her past medical history is significant for hypertension.    63 year old female presents for 3 months follow up.   Has seen cardiologist eval for CP. Nml ECHO. No further chest pain. She feels it was from anxiety.  She is now going to see pulm at [facility] as well as Dr. [name]. Increase cellcept to 500 mg,  Hypertension: Well controlled on current regimen of verapamil and losratan HCTZ  BP Readings from Last 3 Encounters:  03/14/14 120/70  02/14/14 134/78  01/31/14 128/66  Using medication without problems or lightheadedness: None  Chest pain with exertion: none  Edema:None  Short of breath: Yes  Average home BPs: 124/67 Other issues:   Diabetes: Very good controlled DM Per A1C  But in last month CBG trending upon current regimen of max glipizide  Currently on prednisone 10 mg for UIP. Lab Results  Component Value Date   HGBA1C 4.0* 02/03/2014  Using medications without difficulties:  None Hypoglycemic episodes:None  Hyperglycemic episodes: rarely Feet problems:None  Blood Sugars averaging:FBS 134-175, 2 hr after meals not checking eye exam within last year:  Wt Readings from Last 3 Encounters:  03/14/14 258 lb 12 oz (117.368 kg)  02/14/14 258 lb 12.8 oz (117.391 kg)  01/31/14 258 lb (117.028 kg)   Elevated Cholesterol: LDL not at goal  < 130 on no medication  SE to statin in past.  Lab Results  Component Value  Date   CHOL 230* 02/03/2014   HDL 57.60 02/03/2014   LDLCALC 143* 02/03/2014   TRIG 146.0 02/03/2014   CHOLHDL 4 02/03/2014  Diet compliance: improving  Exercise:none  Other complaints:   Migraine, none.. But having milder every other day headache.. She has noted eyes burning off and on.  She is having issues dealing with her decreased mobility with oxygen. HAs family to talk to. No depression. Med does make her feel anxious.    Review of Systems  Constitutional: Positive for fatigue. Negative for fever.  HENT: Negative for ear pain.   Eyes: Negative for pain.  Respiratory: Positive for shortness of breath. Negative for chest tightness.   Cardiovascular: Negative for chest pain, palpitations and leg swelling.  Gastrointestinal: Negative for abdominal pain.  Genitourinary: Negative for dysuria.  Neurological: Positive for headaches.       Objective:   Physical Exam  Constitutional: Vital signs are normal. She appears well-developed and well-nourished. She is cooperative.  Non-toxic appearance. She does not appear ill. No distress.  obese  HENT:  Head: Normocephalic.  Right Ear: Hearing, tympanic membrane, external ear and ear canal normal.  Left Ear: Hearing, tympanic membrane, external ear and ear canal normal.  Nose: Nose normal.  Eyes: Conjunctivae, EOM and lids are normal. Pupils are equal, round, and reactive to light. Lids are everted and swept, no foreign bodies found.  Neck: Trachea normal and normal range of motion. Neck supple. Carotid bruit is not present. No mass and no thyromegaly present.  Cardiovascular: Normal rate, regular rhythm, S1 normal,  S2 normal, normal heart sounds and intact distal pulses.  Exam reveals no gallop.   No murmur heard. Pulmonary/Chest: Effort normal. No respiratory distress. She has decreased breath sounds in the right upper field, the right middle field, the right lower field, the left upper field, the left middle field and the left lower  field. She has no wheezes. She has no rhonchi. She has no rales.  Abdominal: Soft. Normal appearance and bowel sounds are normal. She exhibits no distension, no fluid wave, no abdominal bruit and no mass. There is no hepatosplenomegaly. There is no tenderness. There is no rebound, no guarding and no CVA tenderness. No hernia.  Lymphadenopathy:    She has no cervical adenopathy.    She has no axillary adenopathy.  Neurological: She is alert. She has normal strength. No cranial nerve deficit or sensory deficit.  Skin: Skin is warm, dry and intact. No rash noted.  Psychiatric: Her speech is normal and behavior is normal. Judgment normal. Her mood appears not anxious. Cognition and memory are normal. She does not exhibit a depressed mood.      Diabetic foot exam: Normal inspection No skin breakdown No calluses  Normal DP pulses Normal sensation to light touch and monofilament Nails normal     Assessment & Plan:

## 2014-03-14 NOTE — Assessment & Plan Note (Signed)
Cardiac work up low risk per cardiology. Most likey was secondary to anxiety.. Pt denies depression and anxiety now. We did spend time discussing how to deal with frustration with change in mobility and health. Designer, jewellery.

## 2014-03-14 NOTE — Assessment & Plan Note (Signed)
Per A1C well controlled .Marland Kitchen But FBS trending up in last month. Continue glipized and get back on track with lifestyle changes.

## 2014-03-20 ENCOUNTER — Encounter: Payer: Self-pay | Admitting: Cardiology

## 2014-03-20 ENCOUNTER — Ambulatory Visit (INDEPENDENT_AMBULATORY_CARE_PROVIDER_SITE_OTHER): Payer: BC Managed Care – PPO | Admitting: Cardiology

## 2014-03-20 VITALS — BP 122/66 | HR 82 | Ht 66.0 in | Wt 263.0 lb

## 2014-03-20 DIAGNOSIS — IMO0001 Reserved for inherently not codable concepts without codable children: Secondary | ICD-10-CM

## 2014-03-20 DIAGNOSIS — I119 Hypertensive heart disease without heart failure: Secondary | ICD-10-CM

## 2014-03-20 DIAGNOSIS — J8410 Pulmonary fibrosis, unspecified: Secondary | ICD-10-CM

## 2014-03-20 DIAGNOSIS — R0789 Other chest pain: Secondary | ICD-10-CM

## 2014-03-20 DIAGNOSIS — E1165 Type 2 diabetes mellitus with hyperglycemia: Secondary | ICD-10-CM

## 2014-03-20 DIAGNOSIS — R079 Chest pain, unspecified: Secondary | ICD-10-CM

## 2014-03-20 DIAGNOSIS — I10 Essential (primary) hypertension: Secondary | ICD-10-CM

## 2014-03-20 DIAGNOSIS — J84112 Idiopathic pulmonary fibrosis: Secondary | ICD-10-CM

## 2014-03-20 NOTE — Patient Instructions (Signed)
STOP ASPIRIN   Follow up as needed

## 2014-03-20 NOTE — Assessment & Plan Note (Signed)
Blood pressure is remaining stable on current therapy.  No severe dizziness.  No syncope.  No symptoms of CHF

## 2014-03-20 NOTE — Progress Notes (Signed)
[name] Date of Birth:  [birth year] [place], [place]  [address] [phone]  Fax   [phone]  HPI: This pleasant 63 year old woman is seen by me for a one-month followup office visit. She is a medical patient of Dr. [name]. We were asked to see her by her pulmonologist Dr. [name] in regard to chest pain. The patient last saw Dr. [name] on 02/03/14. During that office visit she mentioned that a week or 2 prior she experienced some left upper chest discomfort with radiation to the left arm. She does not have any history of known ischemic heart disease. The chest discomfort was atypical. It was a sharp stabbing pain of brief duration. Not associated with exertion. It has not recurred over the past several weeks.  The patient has multiple risk factors for premature coronary disease including hypertension, hypercholesterolemia, and diabetes. Her family history does not reveal premature coronary disease however. Her father died of alcoholic cirrhosis and burn injuries at age 86 and her mother died of rheumatoid arthritis at age 45. The patient is a nonsmoker. She is followed closely by pulmonary because of UIP(usual interstitial pneumonitis). She is on CellCept and prednisone. She also is followed for her lungs at [facility]. She has a history of obstructive sleep apnea and uses a CPAP machine. She sleeps in a recliner. She has not been experiencing any peripheral edema. She is dependent on nasal oxygen around-the-clock. Since we last saw her she has had no further episodes of chest pain. She had an echocardiogram on 02/27/14 which showed normal left ventricular systolic function with ejection fraction of 60-65% and no segmental wall motion abnormalities.  She had mild concentric LVH.  She had normal diastolic function.   Current Outpatient Prescriptions  Medication Sig Dispense Refill  . acetaminophen (TYLENOL) 500 MG tablet Take 1,000 mg by mouth every  6 (six) hours as needed for moderate pain.      . Azelastine-Fluticasone (DYMISTA) 137-50 MCG/ACT SUSP One puff per nostril twice daily  23 g  5  . dapsone 100 MG tablet Take 1 tablet (100 mg total) by mouth daily.  30 tablet  3  . diphenhydrAMINE (BENADRYL) 25 mg capsule Take 25 mg by mouth as needed.      Marland Kitchen FIBER PO Take 1 tablet by mouth daily.      Marland Kitchen glipiZIDE (GLUCOTROL) 10 MG tablet Take 1 tablet (10 mg total) by mouth 2 (two) times daily before a meal. *please call office and schedule a follow-up appt. with provider*  60 tablet  0  . hydrocortisone cream 0.5 % Apply 1 application topically 2 (two) times daily as needed (for rash).      Marland Kitchen ibuprofen (ADVIL,MOTRIN) 200 MG tablet Take 200 mg by mouth as needed.      Marland Kitchen levothyroxine (SYNTHROID, LEVOTHROID) 125 MCG tablet Take 125 mcg by mouth daily before breakfast.      . losartan-hydrochlorothiazide (HYZAAR) 50-12.5 MG per tablet Take 1 tablet by mouth daily.  30 tablet  0  . montelukast (SINGULAIR) 10 MG tablet Take 10 mg by mouth at bedtime.      . mycophenolate (CELLCEPT) 500 MG tablet Take 3 tablets (1,500 mg total) by mouth 2 (two) times daily.  180 tablet  1  . NON FORMULARY Place 2 L into the nose daily. 3 Liters with exertion      . ONE TOUCH ULTRA TEST test strip       .  pantoprazole (PROTONIX) 40 MG tablet Take 40 mg by mouth 2 (two) times daily.       . predniSONE (DELTASONE) 10 MG tablet Take 1 tablet (10 mg total) by mouth daily with breakfast.  30 tablet  5  . traMADol-acetaminophen (ULTRACET) 37.5-325 MG per tablet Take 1 tablet by mouth every 6 (six) hours as needed.  30 tablet  0  . traZODone (DESYREL) 50 MG tablet Take 50 mg by mouth at bedtime as needed.      . verapamil (VERELAN PM) 180 MG 24 hr capsule Take 180 mg by mouth daily.       No current facility-administered medications for this visit.    Allergies  Allergen Reactions  . Atovaquone Itching  . Codeine Hives and Swelling  . Demerol [Meperidine] Hives and  Swelling  . Hydrocodone Nausea Only  . Sulfa Antibiotics Rash    Patient Active Problem List   Diagnosis Date Noted  . Chest pain 02/09/2014  . Chronic hypoxemic respiratory failure 01/31/2014  . Microalbuminuria 11/11/2013  . Cystocele 11/11/2013  . HTN (hypertension) 08/25/2013  . High cholesterol 08/25/2013  . Common migraine 08/25/2013  . Unspecified hypothyroidism 08/25/2013  . OSA (obstructive sleep apnea) 08/22/2013  . Metabolic encephalopathy 70/62/3762  . Type II or unspecified type diabetes mellitus without mention of complication, not stated as uncontrolled 08/01/2013  . Abdominal pain, epigastric 07/27/2013  . GERD (gastroesophageal reflux disease) 06/01/2013  . Esophageal dysmotility 05/17/2013  . UIP (usual interstitial pneumonitis) 10/19/2012    History  Smoking status  . Never Smoker   Smokeless tobacco  . Never Used    History  Alcohol Use No    Family History  Problem Relation Age of Onset  . Rheum arthritis Mother   . Rheum arthritis Sister   . Prostate cancer Brother   . Heart disease Maternal Grandmother   . Heart disease Maternal Grandfather   . Uterine cancer Daughter   . Colon cancer Neg Hx     Review of Systems: The patient denies any heat or cold intolerance.  No weight gain or weight loss.  The patient denies headaches or blurry vision.  There is no cough or sputum production.  The patient denies dizziness.  There is no hematuria or hematochezia.  The patient denies any muscle aches or arthritis.  The patient denies any rash.  The patient denies frequent falling or instability.  There is no history of depression or anxiety.  All other systems were reviewed and are negative.   Physical Exam: Filed Vitals:   03/20/14 0920  BP: 122/66  Pulse: 82  The patient appears to be in no distress.  Nasal oxygen in place.  Head and neck exam reveals that the pupils are equal and reactive.  The extraocular movements are full.  There is no scleral  icterus.  Mouth and pharynx are benign.  No lymphadenopathy.  No carotid bruits.  The jugular venous pressure is normal.  Thyroid is not enlarged or tender.  Chest is clear to percussion and auscultation.  No rales or rhonchi.  Expansion of the chest is symmetrical.  Heart reveals no abnormal lift or heave.  First and second heart sounds are normal.  There is no murmur gallop rub or click.  The abdomen is soft and nontender.  Bowel sounds are normoactive.  There is no hepatosplenomegaly or mass.  There are no abdominal bruits.  Extremities reveal no phlebitis or edema.  Pedal pulses are good.  There is no cyanosis  or clubbing.  Neurologic exam is normal strength and no lateralizing weakness.  No sensory deficits.  Integument reveals no rash     Assessment / Plan: 1. Atypical chest pain.  Echocardiogram shows no wall motion abnormalities.  The patient has had no further chest pain since prior to her initial visit.  No further ischemic testing indicated at this point.  2. oxygen-dependent UIP with chronic dyspnea  3. Obesity  4. essential hypertension without heart failure  5. history of hypercholesterolemia  6. diabetes mellitus  Plan: She is on chronic prednisone.  There is no clear indication for her to continue her baby aspirin at this point and we will stop her baby aspirin.  Recheck here when necessary. Many thanks for the opportunity to have seen this pleasant woman with you.

## 2014-03-20 NOTE — Assessment & Plan Note (Signed)
Patient continues to have significant exertional dyspnea requiring round-the-clock oxygen.

## 2014-03-20 NOTE — Assessment & Plan Note (Signed)
The patient has had no further episodes of chest pain since 2 weeks prior to her initial visit with [place].  Her echocardiogram does not suggest ischemic heart disease and there were no wall motion abnormalities.  Her electrocardiogram is normal.

## 2014-03-28 ENCOUNTER — Other Ambulatory Visit (INDEPENDENT_AMBULATORY_CARE_PROVIDER_SITE_OTHER): Payer: BC Managed Care – PPO

## 2014-03-28 DIAGNOSIS — J8410 Pulmonary fibrosis, unspecified: Secondary | ICD-10-CM

## 2014-03-28 DIAGNOSIS — J84112 Idiopathic pulmonary fibrosis: Secondary | ICD-10-CM

## 2014-03-28 LAB — COMPREHENSIVE METABOLIC PANEL
ALBUMIN: 4 g/dL (ref 3.5–5.2)
ALK PHOS: 63 U/L (ref 39–117)
ALT: 20 U/L (ref 0–35)
AST: 19 U/L (ref 0–37)
BILIRUBIN TOTAL: 1.6 mg/dL — AB (ref 0.2–1.2)
BUN: 19 mg/dL (ref 6–23)
CO2: 26 mEq/L (ref 19–32)
Calcium: 9.6 mg/dL (ref 8.4–10.5)
Chloride: 103 mEq/L (ref 96–112)
Creatinine, Ser: 0.8 mg/dL (ref 0.4–1.2)
GFR: 80.38 mL/min (ref >60.00)
Glucose, Bld: 139 mg/dL — ABNORMAL HIGH (ref 70–99)
POTASSIUM: 3.7 meq/L (ref 3.5–5.1)
SODIUM: 139 meq/L (ref 135–145)
Total Protein: 6.7 g/dL (ref 6.0–8.3)

## 2014-03-29 NOTE — Progress Notes (Signed)
Quick Note:  Spoke with pt, she is aware of results. Nothing further needed. ______ 

## 2014-04-04 ENCOUNTER — Ambulatory Visit (INDEPENDENT_AMBULATORY_CARE_PROVIDER_SITE_OTHER): Payer: BC Managed Care – PPO | Admitting: Pulmonary Disease

## 2014-04-04 ENCOUNTER — Encounter: Payer: Self-pay | Admitting: Pulmonary Disease

## 2014-04-04 VITALS — BP 118/62 | HR 91 | Ht 66.0 in | Wt 266.0 lb

## 2014-04-04 DIAGNOSIS — J84112 Idiopathic pulmonary fibrosis: Secondary | ICD-10-CM

## 2014-04-04 DIAGNOSIS — J9610 Chronic respiratory failure, unspecified whether with hypoxia or hypercapnia: Secondary | ICD-10-CM

## 2014-04-04 DIAGNOSIS — J9611 Chronic respiratory failure with hypoxia: Secondary | ICD-10-CM

## 2014-04-04 DIAGNOSIS — J849 Interstitial pulmonary disease, unspecified: Secondary | ICD-10-CM

## 2014-04-04 DIAGNOSIS — R0902 Hypoxemia: Secondary | ICD-10-CM

## 2014-04-04 DIAGNOSIS — J8410 Pulmonary fibrosis, unspecified: Secondary | ICD-10-CM

## 2014-04-04 DIAGNOSIS — Z23 Encounter for immunization: Secondary | ICD-10-CM

## 2014-04-04 NOTE — Assessment & Plan Note (Signed)
This has been a stable interval for [facility]. We performed ambulatory oxygen saturation measurement again today in the office and her oxygen level did not drop below 89% with 3 L. She walked approximately 500 feet.  The plan at this point will be to continue high-dose CellCept and low-dose prednisone for a total of 6 months. I explained to her today that we will consider 02/18/2014 as the beginning of that six-month period.  On 08/21/2014 we will obtain pulmonary function testing as well as a repeat CT scan to see if there has been evidence of progression of her disease. If there has been evidence of progression then we will change her treatment strategy from one of suppressing an autoimmune process to classical IPF therapy. In other words, we may change her to Pirfenidone or Ofev.  Plan: -Pulmonary rehabilitation referral -Continue CellCept, prednisone, and dapsone -Continue monthly blood work -Followup 3 months -Repeat PFT and CT scan in February 2016

## 2014-04-04 NOTE — Progress Notes (Signed)
Subjective:    Patient ID: Kaitlin Hamilton, female    DOB: 28-Jul-1950, 63 y.o.   MRN: [id number]  Synopsis: Kaitlin Hamilton is a very pleasant 63 year old female who first saw the [facility] pulmonary clinic in March 2014 for evaluation of shortness of breath. She had significant cough, wheezing, and sputum production requiring multiple rounds of antibiotics. Because of crackles on lung exam and an abnormal pulmonary function test she was referred to [place]. We ordered full pulmonary function testing which showed moderate restriction and a depressed DLCO in proportion to her restriction. There is no airflow obstruction and no change with bronchodilator administration. A chest x-ray performed in February 2014 was read as normal.  An open lung biopsy was performed in December 2014 showing UIP.  Because she has had multiple good responses to steroids, we have treated her with cellcept and prednisone.  In January 2015 she had severe insomnia, hallucinations and gastritis on high dose prednisone.  She had a rash to bactrim. Since February 2015 she has been on cellcept and low dose prednisone and dapsone.  HPI  04/04/2014 ROV > Since she saw Dr. Randol Kern she has been taking cellcept 1536m twice a day.  She feels she has a tremor as well as anxiety.  She says that this correllates with the higher dose.   Her breathing is not significantly better than since the last visit.  She continues to have some pain in her right chest as well as the left chest.  She can't lie flat because of dyspnea.  She has not had too much leg swelling.  She has been using 2 L O2 at rest and 3 L with exertion.  She would like to go to pulmonary rehab.    Past Medical History  Diagnosis Date  . Bronchitis   . Chicken pox   . Depression   . Allergic rhinitis     uses Flonase daily  . Hyperlipidemia     lost 43 pounds and no meds required at present  . Hypokalemia   . Anxiety   . PONV (postoperative nausea and vomiting)   .  Hypertension     takes Verapamil and HCTZ daily  . Shortness of breath     with exertion;takes Singulair daily as well as Flonase  . Pneumonia     last time in 2006  . History of bronchitis     2013  . H/O hiatal hernia   . Migraines     last one about a month ago  . Dysphagia   . Joint swelling     left thumb  . OA (osteoarthritis)     left knee  . GERD (gastroesophageal reflux disease)     takes Protonix daily  . Hemorrhoids   . History of colon polyps   . Diverticulosis   . Urinary frequency   . History of kidney stones   . Non-insulin dependent type 2 diabetes mellitus     takes Glipizide daily  . Constipation     takes Fiber daily  . Hypothyroidism (acquired)     takes Synthroid daily  . Insomnia     doesn't take any meds for this        Review of Systems  Constitutional: Negative for fever, chills, activity change and fatigue.  HENT: Negative for congestion, ear pain, nosebleeds, postnasal drip, rhinorrhea and sinus pressure.   Respiratory: Positive for shortness of breath. Negative for cough and wheezing.   Cardiovascular: Negative for chest pain, palpitations  and leg swelling.  Gastrointestinal: Negative for nausea and abdominal pain.       Objective:   Physical Exam   Filed Vitals:   04/04/14 1053  BP: 118/62  Pulse: 91  Height: '5\' 6"'  (1.676 m)  Weight: 266 lb (120.657 kg)  SpO2: 93%  2L [place]  Walked 500 feet on 3 L oxygen, O2 saturation dropped to 89%  Gen:  no acute distress  HEENT: NCAT,  EOMi, OP clear,  PULM: Crackles 1/2 way up bilaterally CV: RRR, no mgr, no JVD AB: BS+, soft, nontender, no hsm Ext: warm, no edema, no clubbing Neuro: A&Ox4, maew  February 2014 simple spirometry performed by her primary care physician>> ratio 90%, FEV1 1.71 L (64% predicted, FVC 1.83 L 55% predicted; flow volume loop is not consistent with obstruction February 2014 chest x-ray at [facility] normal 10/19/2012 walked 500 feet in office on room air oxygenation  did not drop below 90% 11/15/2012 Full PFT [name]> Ratio 87%, FEV1 2.00L > 2.07 with bronchodilator (3% change); TLC 3.44 L (65% pred), ERV 0.62 (57% pred), DLCO 15.1 ml/mmHg/min (59% pred) 11/2012 CT chest >> (Kirston Luty read) centrilobular nodules, interlobular septal thickening worse in bases and periphery R lung > L; some GGO in bases and periphery as well, some bronchiectasis in the bases R > L; findings suggestive of fibrosis but not UIP; question hypersensitivity pneumonitis given centrilobular nodules; also question aspiration 03/2013 ANA, ANCA, Anti-Jo-1, ESR, RF, SCL-70, anti-centromere, SSA/SSB, all negative; CRP 0.6;  04/2013 Barium swallow> abnormal esophageal motility, GERD, hiatal hernia 04/2013 Full PFT> Ratio 93%, FEV1 1.92 L (71% pred), TLC 3.02L (56% pred), DLCO 15.94 (59% pred) 04/2013 CT chest ([name])> findings suggestive of but not diagnostic of NSIP, small pulmonary nodule 04/2013 6MW RA > 1100 feet, HR peak 109, O2 sat Nad[place] 87% 04/2013 HP panel >> 05/17/2013 3 month prednisone trial 06/2013 open lung biopsy> UIP 07/28/13 Cell cept and bactrim started > bactrim stopped 07/29/13 due to rash  07/2013 hospitalized for confusion > prednisone 07/2013 hospitalized for gastritis/abdominal pain> prednisone 08/22/2013 atovaquone caused rash 11/2013 6MW 1364 feet, O2 saturation nadir 78% May 2015 full pulmonary function test ratio 93%, FEV1 1.71 L (64% predicted, no change with bronchodilator), total lung capacity 2.67 L (49% predicted), DLCO 13.11 (40% predicted)      Assessment & Plan:   UIP (usual interstitial pneumonitis) This has been a stable interval for [place]. We performed ambulatory oxygen saturation measurement again today in the office and her oxygen level did not drop below 89% with 3 L. She walked approximately 500 feet.  The plan at this point will be to continue high-dose CellCept and low-dose prednisone for a total of 6 months. I explained to her today that we will consider  02/18/2014 as the beginning of that six-month period.  On 08/21/2014 we will obtain pulmonary function testing as well as a repeat CT scan to see if there has been evidence of progression of her disease. If there has been evidence of progression then we will change her treatment strategy from one of suppressing an autoimmune process to classical IPF therapy. In other words, we may change her to Pirfenidone or Ofev.  Plan: -Pulmonary rehabilitation referral -Continue CellCept, prednisone, and dapsone -Continue monthly blood work -Followup 3 months -Repeat PFT and CT scan in February 2016  Chronic hypoxemic respiratory failure As noted above, I was unable to reproduce the severe hypoxemia she demonstrated when she was in the [place] clinic.  Plan: -Continue 2 L  of oxygen at rest, 3 L with exertion -I will ask the pulmonary rehabilitation folks to send [place] results of her oxygen saturation when she is exercising heavily    Updated Medication List Outpatient Encounter Prescriptions as of 04/04/2014  Medication Sig  . acetaminophen (TYLENOL) 500 MG tablet Take 1,000 mg by mouth every 6 (six) hours as needed for moderate pain.  . Azelastine-Fluticasone (DYMISTA) 137-50 MCG/ACT SUSP One puff per nostril twice daily  . dapsone 100 MG tablet Take 1 tablet (100 mg total) by mouth daily.  . diphenhydrAMINE (BENADRYL) 25 mg capsule Take 25 mg by mouth as needed.  Marland Kitchen FIBER PO Take 1 tablet by mouth daily.  Marland Kitchen glipiZIDE (GLUCOTROL) 10 MG tablet Take 1 tablet (10 mg total) by mouth 2 (two) times daily before a meal. *please call office and schedule a follow-up appt. with provider*  . hydrocortisone cream 0.5 % Apply 1 application topically 2 (two) times daily as needed (for rash).  Marland Kitchen ibuprofen (ADVIL,MOTRIN) 200 MG tablet Take 200 mg by mouth as needed.  Marland Kitchen levothyroxine (SYNTHROID, LEVOTHROID) 125 MCG tablet Take 125 mcg by mouth daily before breakfast.  . losartan-hydrochlorothiazide (HYZAAR) 50-12.5 MG  per tablet Take 1 tablet by mouth daily.  . montelukast (SINGULAIR) 10 MG tablet Take 10 mg by mouth at bedtime.  . mycophenolate (CELLCEPT) 500 MG tablet Take 3 tablets (1,500 mg total) by mouth 2 (two) times daily.  . NON FORMULARY Place 2 L into the nose daily. 3 Liters with exertion  . ONE TOUCH ULTRA TEST test strip   . pantoprazole (PROTONIX) 40 MG tablet Take 40 mg by mouth 2 (two) times daily.   . predniSONE (DELTASONE) 10 MG tablet Take 1 tablet (10 mg total) by mouth daily with breakfast.  . traMADol-acetaminophen (ULTRACET) 37.5-325 MG per tablet Take 1 tablet by mouth every 6 (six) hours as needed.  . traZODone (DESYREL) 50 MG tablet Take 50 mg by mouth at bedtime as needed.  . verapamil (VERELAN PM) 180 MG 24 hr capsule Take 180 mg by mouth daily.

## 2014-04-04 NOTE — Assessment & Plan Note (Signed)
As noted above, I was unable to reproduce the severe hypoxemia she demonstrated when she was in the [place] clinic.  Plan: -Continue 2 L of oxygen at rest, 3 L with exertion -I will ask the pulmonary rehabilitation folks to send [place] results of her oxygen saturation when she is exercising heavily

## 2014-04-04 NOTE — Patient Instructions (Signed)
We will refer you to pulmonary rehab at [facility] Keep using your oxygen as you are doing Keep taking your medicines as you are doing  Keep coming for monthly blood work We will see you back in 3 months or sooner if needed

## 2014-04-24 ENCOUNTER — Other Ambulatory Visit: Payer: Self-pay | Admitting: *Deleted

## 2014-04-24 MED ORDER — GLIPIZIDE 10 MG PO TABS
10.0000 mg | ORAL_TABLET | Freq: Two times a day (BID) | ORAL | Status: DC
Start: 2014-04-24 — End: 2014-08-25

## 2014-04-25 ENCOUNTER — Other Ambulatory Visit (INDEPENDENT_AMBULATORY_CARE_PROVIDER_SITE_OTHER): Payer: BC Managed Care – PPO

## 2014-04-25 DIAGNOSIS — J84112 Idiopathic pulmonary fibrosis: Secondary | ICD-10-CM

## 2014-04-25 LAB — COMPREHENSIVE METABOLIC PANEL
ALT: 20 U/L (ref 0–35)
AST: 20 U/L (ref 0–37)
Albumin: 4.4 g/dL (ref 3.5–5.2)
Alkaline Phosphatase: 66 U/L (ref 39–117)
BILIRUBIN TOTAL: 1 mg/dL (ref 0.2–1.2)
BUN: 17 mg/dL (ref 6–23)
CO2: 27 mEq/L (ref 19–32)
Calcium: 9.7 mg/dL (ref 8.4–10.5)
Chloride: 101 mEq/L (ref 96–112)
Creatinine, Ser: 0.8 mg/dL (ref 0.4–1.2)
GFR: 75.8 mL/min (ref >60.00)
GLUCOSE: 163 mg/dL — AB (ref 70–99)
Potassium: 3.5 mEq/L (ref 3.5–5.1)
Sodium: 137 mEq/L (ref 135–145)
Total Protein: 7.6 g/dL (ref 6.0–8.3)

## 2014-04-27 ENCOUNTER — Encounter (HOSPITAL_COMMUNITY): Payer: Self-pay | Admitting: Emergency Medicine

## 2014-04-27 ENCOUNTER — Emergency Department (HOSPITAL_COMMUNITY)
Admission: EM | Admit: 2014-04-27 | Discharge: 2014-04-27 | Disposition: A | Discharge status: Home / Self Care | Payer: BC Managed Care – PPO | Attending: Emergency Medicine | Admitting: Emergency Medicine

## 2014-04-27 ENCOUNTER — Telehealth: Payer: Self-pay | Admitting: Pulmonary Disease

## 2014-04-27 ENCOUNTER — Emergency Department (HOSPITAL_COMMUNITY): Payer: BC Managed Care – PPO

## 2014-04-27 DIAGNOSIS — I10 Essential (primary) hypertension: Secondary | ICD-10-CM | POA: Insufficient documentation

## 2014-04-27 DIAGNOSIS — K219 Gastro-esophageal reflux disease without esophagitis: Secondary | ICD-10-CM | POA: Diagnosis not present

## 2014-04-27 DIAGNOSIS — F419 Anxiety disorder, unspecified: Secondary | ICD-10-CM | POA: Diagnosis not present

## 2014-04-27 DIAGNOSIS — Z7951 Long term (current) use of inhaled steroids: Secondary | ICD-10-CM | POA: Diagnosis not present

## 2014-04-27 DIAGNOSIS — E039 Hypothyroidism, unspecified: Secondary | ICD-10-CM | POA: Diagnosis not present

## 2014-04-27 DIAGNOSIS — Z87442 Personal history of urinary calculi: Secondary | ICD-10-CM | POA: Insufficient documentation

## 2014-04-27 DIAGNOSIS — Z8701 Personal history of pneumonia (recurrent): Secondary | ICD-10-CM | POA: Insufficient documentation

## 2014-04-27 DIAGNOSIS — K5900 Constipation, unspecified: Secondary | ICD-10-CM | POA: Diagnosis not present

## 2014-04-27 DIAGNOSIS — F329 Major depressive disorder, single episode, unspecified: Secondary | ICD-10-CM | POA: Insufficient documentation

## 2014-04-27 DIAGNOSIS — N132 Hydronephrosis with renal and ureteral calculous obstruction: Secondary | ICD-10-CM | POA: Diagnosis not present

## 2014-04-27 DIAGNOSIS — Z79899 Other long term (current) drug therapy: Secondary | ICD-10-CM | POA: Insufficient documentation

## 2014-04-27 DIAGNOSIS — G43909 Migraine, unspecified, not intractable, without status migrainosus: Secondary | ICD-10-CM | POA: Diagnosis not present

## 2014-04-27 DIAGNOSIS — M1990 Unspecified osteoarthritis, unspecified site: Secondary | ICD-10-CM | POA: Insufficient documentation

## 2014-04-27 DIAGNOSIS — Z86010 Personal history of colonic polyps: Secondary | ICD-10-CM | POA: Diagnosis not present

## 2014-04-27 DIAGNOSIS — Z7952 Long term (current) use of systemic steroids: Secondary | ICD-10-CM | POA: Diagnosis not present

## 2014-04-27 DIAGNOSIS — R109 Unspecified abdominal pain: Secondary | ICD-10-CM | POA: Diagnosis present

## 2014-04-27 DIAGNOSIS — E119 Type 2 diabetes mellitus without complications: Secondary | ICD-10-CM | POA: Diagnosis not present

## 2014-04-27 LAB — URINALYSIS, ROUTINE W REFLEX MICROSCOPIC
Ketones, ur: NEGATIVE mg/dL
Leukocytes, UA: NEGATIVE
Nitrite: NEGATIVE
Protein, ur: 100 mg/dL — AB
SPECIFIC GRAVITY, URINE: 1.031 — AB (ref 1.005–1.030)
UROBILINOGEN UA: 0.2 mg/dL (ref 0.0–1.0)
pH: 5 (ref 5.0–8.0)

## 2014-04-27 LAB — COMPREHENSIVE METABOLIC PANEL
ALK PHOS: 82 U/L (ref 39–117)
ALT: 20 U/L (ref 0–35)
AST: 18 U/L (ref 0–37)
Albumin: 4.4 g/dL (ref 3.5–5.2)
Anion gap: 15 (ref 5–15)
BILIRUBIN TOTAL: 0.7 mg/dL (ref 0.3–1.2)
BUN: 17 mg/dL (ref 6–23)
CHLORIDE: 98 meq/L (ref 96–112)
CO2: 23 mEq/L (ref 19–32)
Calcium: 9.9 mg/dL (ref 8.4–10.5)
Creatinine, Ser: 0.95 mg/dL (ref 0.50–1.10)
GFR, EST AFRICAN AMERICAN: 72 mL/min — AB (ref >90)
GFR, EST NON AFRICAN AMERICAN: 62 mL/min — AB (ref >90)
GLUCOSE: 281 mg/dL — AB (ref 70–99)
Potassium: 3.9 mEq/L (ref 3.7–5.3)
SODIUM: 136 meq/L — AB (ref 137–147)
Total Protein: 7.4 g/dL (ref 6.0–8.3)

## 2014-04-27 LAB — CBC WITH DIFFERENTIAL/PLATELET
BASOS ABS: 0 10*3/uL (ref 0.0–0.1)
Basophils Relative: 0 % (ref 0–1)
Eosinophils Absolute: 0.1 10*3/uL (ref 0.0–0.7)
Eosinophils Relative: 1 % (ref 0–5)
HEMATOCRIT: 35.1 % — AB (ref 36.0–46.0)
Hemoglobin: 10.9 g/dL — ABNORMAL LOW (ref 12.0–15.0)
LYMPHS ABS: 1.7 10*3/uL (ref 0.7–4.0)
LYMPHS PCT: 12 % (ref 12–46)
MCH: 31.5 pg (ref 26.0–34.0)
MCHC: 31.1 g/dL (ref 30.0–36.0)
MCV: 101.4 fL — ABNORMAL HIGH (ref 78.0–100.0)
Monocytes Absolute: 0.6 10*3/uL (ref 0.1–1.0)
Monocytes Relative: 4 % (ref 3–12)
Neutro Abs: 11.7 10*3/uL — ABNORMAL HIGH (ref 1.7–7.7)
Neutrophils Relative %: 83 % — ABNORMAL HIGH (ref 43–77)
PLATELETS: 306 10*3/uL (ref 150–400)
RBC: 3.46 MIL/uL — AB (ref 3.87–5.11)
RDW: 15.3 % (ref 11.5–15.5)
WBC: 14.2 10*3/uL — AB (ref 4.0–10.5)

## 2014-04-27 LAB — URINE MICROSCOPIC-ADD ON

## 2014-04-27 MED ORDER — HYDROMORPHONE HCL 1 MG/ML IJ SOLN
1.0000 mg | Freq: Once | INTRAMUSCULAR | Status: AC
  Administered 2014-04-27: 1 mg via INTRAVENOUS
  Filled 2014-04-27: qty 1

## 2014-04-27 MED ORDER — KETOROLAC TROMETHAMINE 30 MG/ML IJ SOLN
30.0000 mg | Freq: Once | INTRAMUSCULAR | Status: AC
  Administered 2014-04-27: 30 mg via INTRAVENOUS
  Filled 2014-04-27: qty 1

## 2014-04-27 MED ORDER — TAMSULOSIN HCL 0.4 MG PO CAPS
0.4000 mg | ORAL_CAPSULE | ORAL | Status: AC
  Administered 2014-04-27: 0.4 mg via ORAL
  Filled 2014-04-27: qty 1

## 2014-04-27 MED ORDER — PROMETHAZINE HCL 25 MG/ML IJ SOLN
25.0000 mg | Freq: Once | INTRAMUSCULAR | Status: AC
  Administered 2014-04-27: 25 mg via INTRAVENOUS
  Filled 2014-04-27: qty 1

## 2014-04-27 MED ORDER — ONDANSETRON 4 MG PO TBDP
8.0000 mg | ORAL_TABLET | Freq: Once | ORAL | Status: AC
  Administered 2014-04-27: 8 mg via ORAL
  Filled 2014-04-27: qty 2

## 2014-04-27 MED ORDER — TRAMADOL HCL 50 MG PO TABS
50.0000 mg | ORAL_TABLET | Freq: Once | ORAL | Status: AC
  Administered 2014-04-27: 50 mg via ORAL
  Filled 2014-04-27: qty 1

## 2014-04-27 MED ORDER — SODIUM CHLORIDE 0.9 % IV BOLUS (SEPSIS)
1000.0000 mL | Freq: Once | INTRAVENOUS | Status: AC
  Administered 2014-04-27: 1000 mL via INTRAVENOUS

## 2014-04-27 MED ORDER — ONDANSETRON HCL 4 MG PO TABS: 4.0000 mg | ORAL_TABLET | Freq: Four times a day (QID) | ORAL | Status: DC

## 2014-04-27 MED ORDER — TAMSULOSIN HCL 0.4 MG PO CAPS: 0.4000 mg | ORAL_CAPSULE | Freq: Two times a day (BID) | ORAL | Status: DC

## 2014-04-27 MED ORDER — ONDANSETRON HCL 4 MG/2ML IJ SOLN
4.0000 mg | Freq: Once | INTRAMUSCULAR | Status: AC
  Administered 2014-04-27: 4 mg via INTRAVENOUS
  Filled 2014-04-27: qty 2

## 2014-04-27 MED ORDER — OXYCODONE-ACETAMINOPHEN 5-325 MG PO TABS
2.0000 | ORAL_TABLET | Freq: Once | ORAL | Status: AC
Start: 2014-04-27 — End: 2014-04-27
  Administered 2014-04-27: 2 via ORAL
  Filled 2014-04-27: qty 2

## 2014-04-27 MED ORDER — PROMETHAZINE HCL 25 MG RE SUPP: 25.0000 mg | Freq: Four times a day (QID) | RECTAL | Status: DC | PRN

## 2014-04-27 MED ORDER — OXYCODONE-ACETAMINOPHEN 5-325 MG PO TABS: 1.0000 | ORAL_TABLET | ORAL | Status: DC | PRN

## 2014-04-27 NOTE — ED Notes (Signed)
Pt voided x 2 and no relief; pt continues to be nauseated, diaphoretic. Pt reassured room soon.

## 2014-04-27 NOTE — ED Notes (Signed)
Pt is diaphoretic and crying. Pt's allergies prohibit triage protocol pain management.

## 2014-04-27 NOTE — Progress Notes (Signed)
Quick Note:  lmtcb X1 to relay results. ______ 

## 2014-04-27 NOTE — Progress Notes (Signed)
Quick Note:  Pt aware of results and recs. Nothing further needed. ______

## 2014-04-27 NOTE — ED Notes (Signed)
Pt actively vomiting; cup given for sample.

## 2014-04-27 NOTE — ED Notes (Signed)
PT states she has had it before and did ok with tramadol.

## 2014-04-27 NOTE — Discharge Instructions (Signed)

## 2014-04-27 NOTE — ED Provider Notes (Signed)
CSN: [id number]     Arrival date & time 04/27/14  1905 History   First MD Initiated Contact with Patient 04/27/14 2028     Chief Complaint  Patient presents with  . Flank Pain    (Consider location/radiation/quality/duration/timing/severity/associated sxs/prior Treatment) HPI Comments: Patient is a 63 year old female with history of hyperlipidemia, hypokalemia, hypertension, and GERD, kidney stones, diabetes, and hypothyroidism who presents to the ED today for evaluation of kidney stones. She reports at 1PM she had sudden onset sharp left sided flank pain. This pain feels identical to prior kidney stones, although she has not had one in approximately 5 years. She has not been able to pass all her prior stones spontaneously. Her urologist was in [place] and has since retired. Patient has associated nausea and vomiting. She has not taken anything to improve her symptoms. She denies fevers, chills, diarrhea, chest pain, shortness of breath.   The history is provided by the patient. No language interpreter was used.    Past Medical History  Diagnosis Date  . Bronchitis   . Chicken pox   . Depression   . Allergic rhinitis     uses Flonase daily  . Hyperlipidemia     lost 43 pounds and no meds required at present  . Hypokalemia   . Anxiety   . PONV (postoperative nausea and vomiting)   . Hypertension     takes Verapamil and HCTZ daily  . Shortness of breath     with exertion;takes Singulair daily as well as Flonase  . Pneumonia     last time in 2006  . History of bronchitis     2013  . H/O hiatal hernia   . Migraines     last one about a month ago  . Dysphagia   . Joint swelling     left thumb  . OA (osteoarthritis)     left knee  . GERD (gastroesophageal reflux disease)     takes Protonix daily  . Hemorrhoids   . History of colon polyps   . Diverticulosis   . Urinary frequency   . History of kidney stones   . Non-insulin dependent type 2 diabetes mellitus     takes  Glipizide daily  . Constipation     takes Fiber daily  . Hypothyroidism (acquired)     takes Synthroid daily  . Insomnia     doesn't take any meds for this   Past Surgical History  Procedure Laterality Date  . Tubal ligation    . Cholecystectomy    . Appendectomy    . Abdominal exploration surgery      For Ovarian Cyst   . Left knee arthroscopy    . Esophagogastroduodenoscopy    . Tcs    . Lithotripsy      x 2  . Video assisted thoracoscopy Right 06/29/2013    Procedure: VIDEO ASSISTED THORACOSCOPY;  Surgeon: Melrose Nakayama, MD;  Location: [facility];  Service: Thoracic;  Laterality: Right;  . Lung biopsy Right 06/29/2013    Procedure: LUNG BIOPSY;  Surgeon: Melrose Nakayama, MD;  Location: [facility];  Service: Thoracic;  Laterality: Right;  . Enteroscopy N/A 08/12/2013    Procedure: ENTEROSCOPY;  Surgeon: Lafayette Dragon, MD;  Location: [facility];  Service: Endoscopy;  Laterality: N/A;   Family History  Problem Relation Age of Onset  . Rheum arthritis Mother   . Rheum arthritis Sister   . Prostate cancer Brother   . Heart disease Maternal Grandmother   .  Heart disease Maternal Grandfather   . Uterine cancer Daughter   . Colon cancer Neg Hx    History  Substance Use Topics  . Smoking status: Never Smoker   . Smokeless tobacco: Never Used  . Alcohol Use: No   OB History   Grav Para Term Preterm Abortions TAB SAB Ect Mult Living                 Review of Systems  Constitutional: Negative for fever and chills.  Respiratory: Negative for shortness of breath.   Cardiovascular: Negative for chest pain.  Gastrointestinal: Positive for nausea and vomiting. Negative for diarrhea.  Genitourinary: Positive for flank pain and difficulty urinating.  All other systems reviewed and are negative.     Allergies  Atovaquone; Codeine; Demerol; Hydrocodone; and Sulfa antibiotics  Home Medications   Prior to Admission medications   Medication Sig Start Date End Date  Taking? Authorizing Provider  acetaminophen (TYLENOL) 500 MG tablet Take 1,000 mg by mouth every 6 (six) hours as needed for moderate pain.    Historical Provider, MD  Azelastine-Fluticasone [facility]) 137-50 MCG/ACT SUSP One puff per nostril twice daily 10/12/13   Juanito Doom, MD  dapsone 100 MG tablet Take 1 tablet (100 mg total) by mouth daily. 02/24/14   Juanito Doom, MD  diphenhydrAMINE (BENADRYL) 25 mg capsule Take 25 mg by mouth as needed.    Historical Provider, MD  FIBER PO Take 1 tablet by mouth daily.    Historical Provider, MD  glipiZIDE (GLUCOTROL) 10 MG tablet Take 1 tablet (10 mg total) by mouth 2 (two) times daily before a meal. 04/24/14   Amy E [name], MD  hydrocortisone cream 0.5 % Apply 1 application topically 2 (two) times daily as needed (for rash).    Historical Provider, MD  ibuprofen (ADVIL,MOTRIN) 200 MG tablet Take 200 mg by mouth as needed.    Historical Provider, MD  levothyroxine (SYNTHROID, LEVOTHROID) 125 MCG tablet Take 125 mcg by mouth daily before breakfast.    Historical Provider, MD  losartan-hydrochlorothiazide (HYZAAR) 50-12.5 MG per tablet Take 1 tablet by mouth daily. 11/14/13   Amy Cletis [place], MD  montelukast (SINGULAIR) 10 MG tablet Take 10 mg by mouth at bedtime.    Historical Provider, MD  mycophenolate (CELLCEPT) 500 MG tablet Take 3 tablets (1,500 mg total) by mouth 2 (two) times daily. 03/01/14   Juanito Doom, MD  NON FORMULARY Place 2 L into the nose daily. 3 Liters with exertion    Historical Provider, MD  ONE TOUCH ULTRA TEST test strip  02/22/14   Historical Provider, MD  pantoprazole (PROTONIX) 40 MG tablet Take 40 mg by mouth 2 (two) times daily.  01/30/14   Historical Provider, MD  predniSONE (DELTASONE) 10 MG tablet Take 1 tablet (10 mg total) by mouth daily with breakfast. 09/23/13   Juanito Doom, MD  traMADol-acetaminophen (ULTRACET) 37.5-325 MG per tablet Take 1 tablet by mouth every 6 (six) hours as needed. 08/03/13   Nishant  Dhungel, MD  traZODone (DESYREL) 50 MG tablet Take 50 mg by mouth at bedtime as needed. 09/23/13   Juanito Doom, MD  verapamil (VERELAN PM) 180 MG 24 hr capsule Take 180 mg by mouth daily.    Historical Provider, MD   BP 120/56  Pulse 83  Temp(Src) 97.6 F (36.4 C) (Oral)  Resp 21  Ht 5\' 6"  (1.676 m)  Wt 266 lb (120.657 kg)  BMI 42.95 kg/m2  SpO2 95% Physical  Exam  Nursing note and vitals reviewed. Constitutional: She is oriented to person, place, and time. She appears well-developed and well-nourished. She appears distressed.  HENT:  Head: Normocephalic and atraumatic.  Right Ear: External ear normal.  Left Ear: External ear normal.  Nose: Nose normal.  Mouth/Throat: Oropharynx is clear and moist.  Eyes: Conjunctivae are normal.  Neck: Normal range of motion.  Cardiovascular: Normal rate, regular rhythm and normal heart sounds.   Pulmonary/Chest: Effort normal and breath sounds normal. No stridor. No respiratory distress. She has no wheezes. She has no rales.  Abdominal: Soft. She exhibits no distension.    Musculoskeletal: Normal range of motion.       Back:  Neurological: She is alert and oriented to person, place, and time. She has normal strength.  Skin: Skin is warm and dry. She is not diaphoretic. No erythema.  Psychiatric: She has a normal mood and affect. Her behavior is normal.    ED Course  Procedures (including critical care time) Labs Review Labs Reviewed  URINALYSIS, ROUTINE W REFLEX MICROSCOPIC - Abnormal; Notable for the following:    Color, Urine AMBER (*)    Specific Gravity, Urine 1.031 (*)    Glucose, UA >1000 (*)    Hgb urine dipstick LARGE (*)    Bilirubin Urine SMALL (*)    Protein, ur 100 (*)    All other components within normal limits  CBC WITH DIFFERENTIAL - Abnormal; Notable for the following:    WBC 14.2 (*)    RBC 3.46 (*)    Hemoglobin 10.9 (*)    HCT 35.1 (*)    MCV 101.4 (*)    Neutrophils Relative % 83 (*)    Neutro Abs 11.7  (*)    All other components within normal limits  COMPREHENSIVE METABOLIC PANEL - Abnormal; Notable for the following:    Sodium 136 (*)    Glucose, Bld 281 (*)    GFR calc non Af Amer 62 (*)    GFR calc Af Amer 72 (*)    All other components within normal limits  URINE MICROSCOPIC-ADD ON - Abnormal; Notable for the following:    Squamous Epithelial / LPF MANY (*)    Bacteria, UA FEW (*)    All other components within normal limits    Imaging Review Ct Abdomen Pelvis Wo Contrast  04/27/2014   CLINICAL DATA:  Acute left flank pain.  EXAM: CT ABDOMEN AND PELVIS WITHOUT CONTRAST  TECHNIQUE: Multidetector CT imaging of the abdomen and pelvis was performed following the standard protocol without IV contrast.  COMPARISON:  CT scan of August 08, 2013.  FINDINGS: Multilevel degenerative disc disease is noted in the lumbar spine. Mild scarring is noted in the right lung base.  Status post cholecystectomy. No focal abnormality is noted in the liver, spleen or pancreas on these unenhanced images. Adrenal glands appear normal. Small nonobstructive calculus is noted in the right kidney. 5.4 cm simple cyst is seen involving upper pole of left kidney. Moderate left hydroureteronephrosis is noted secondary to 7 mm calculus in the distal left ureter. Urinary bladder is decompressed. Diverticulosis is noted throughout the colon without inflammation. No abnormal fluid collection is noted. No significant adenopathy is noted. Small nonobstructive calculus is noted in lower pole collecting system of left kidney.  IMPRESSION: Bilateral nephrolithiasis. Moderate left hydroureteronephrosis secondary to 7 mm calculus in the distal left ureter.   Electronically Signed   By: Sabino Dick M.D.   On: 04/27/2014 21:25  EKG Interpretation None      MDM   Final diagnoses:  Ureteral stone with hydronephrosis    Patient presents to ED with left sided flank pain. Patient with 79mm calculus in the distal left ureter  with associated moderate hydronephrosis. Patient's pain controlled with well IV narcotics and Toradol. Patient feels significantly improved. Creatinine is 0.95, UA shows no sign of infection. Patient is appropriate for f/u with urology as an outpatient. Discussed reasons to return to ED immediately. Vital signs stable for discharge. Dr. Wyvonnia Dusky evaluated patient and agrees with plan. Patient / Family / Caregiver informed of clinical course, understand medical decision-making process, and agree with plan.     Elwyn Lade, PA-C 04/30/14 1941

## 2014-04-27 NOTE — ED Notes (Signed)
Pt. reports left flank pain radiating to LLQ onset this afternoon with dysuria , denies hematura , no fever or chills. Vomitted today .

## 2014-04-27 NOTE — Telephone Encounter (Signed)
A,  Please let her know that the only problem was that her blood sugar was a little high, everything else OK. She should discuss with her PCP  Thanks B ----------------------------------------  Spoke with pt, she is aware of results and recs.  Nothing further needed at this time.

## 2014-04-27 NOTE — ED Notes (Signed)
Discharge and follow up reviewed with pt. Pt verbalized understanding.  

## 2014-04-28 ENCOUNTER — Telehealth: Payer: Self-pay | Admitting: Pulmonary Disease

## 2014-04-28 DIAGNOSIS — J84112 Idiopathic pulmonary fibrosis: Secondary | ICD-10-CM

## 2014-04-28 DIAGNOSIS — G4733 Obstructive sleep apnea (adult) (pediatric): Secondary | ICD-10-CM

## 2014-04-28 DIAGNOSIS — J9611 Chronic respiratory failure with hypoxia: Secondary | ICD-10-CM

## 2014-04-28 NOTE — Telephone Encounter (Signed)
Order placed. Kaitlin Hamilton, CMA  

## 2014-04-28 NOTE — Telephone Encounter (Signed)
We can do it, I think they asked at the end of a visit recently on their way out the door I'm OK with it Reason: chronic hypoxemic respiratory failure, IPF, OSA

## 2014-04-28 NOTE — Telephone Encounter (Signed)
[employer] called stating the pt advised them that Dr. [name] was going to order a hospital bed for the pt. This needs a prior auth. I do not see an order for this or anything mentioned in OV notes. Please advise if you want to order a bed for this pt or should this go through PCP? Kaitlin Hamilton, CMA

## 2014-05-01 NOTE — ED Provider Notes (Signed)
Medical screening examination/treatment/procedure(s) were conducted as a shared visit with non-physician practitioner(s) and myself.  I personally evaluated the patient during the encounter.  L flank pain with nausea and vomiting.  Hx kidney stones.  TTP L flank.  No peritoneal signs.    EKG Interpretation None       Ezequiel Essex, MD 05/01/14 [phone]

## 2014-05-19 ENCOUNTER — Telehealth: Payer: Self-pay | Admitting: Pulmonary Disease

## 2014-05-19 MED ORDER — MYCOPHENOLATE MOFETIL 500 MG PO TABS
1500.0000 mg | ORAL_TABLET | Freq: Two times a day (BID) | ORAL | Status: DC
Start: 2014-05-19 — End: 2014-07-17

## 2014-05-19 NOTE — Telephone Encounter (Signed)
RX has been sent in. Nothing further needed 

## 2014-05-23 ENCOUNTER — Encounter: Payer: Self-pay | Admitting: Pulmonary Disease

## 2014-05-23 ENCOUNTER — Other Ambulatory Visit (INDEPENDENT_AMBULATORY_CARE_PROVIDER_SITE_OTHER): Payer: BC Managed Care – PPO

## 2014-05-23 DIAGNOSIS — J84112 Idiopathic pulmonary fibrosis: Secondary | ICD-10-CM

## 2014-05-23 LAB — COMPREHENSIVE METABOLIC PANEL
ALBUMIN: 3.9 g/dL (ref 3.5–5.2)
ALT: 24 U/L (ref 0–35)
AST: 25 U/L (ref 0–37)
Alkaline Phosphatase: 72 U/L (ref 39–117)
BUN: 19 mg/dL (ref 6–23)
CALCIUM: 9.6 mg/dL (ref 8.4–10.5)
CHLORIDE: 103 meq/L (ref 96–112)
CO2: 17 mEq/L — ABNORMAL LOW (ref 19–32)
Creatinine, Ser: 1 mg/dL (ref 0.4–1.2)
GFR: 62.29 mL/min (ref >60.00)
Glucose, Bld: 201 mg/dL — ABNORMAL HIGH (ref 70–99)
POTASSIUM: 3.4 meq/L — AB (ref 3.5–5.1)
SODIUM: 140 meq/L (ref 135–145)
Total Bilirubin: 0.9 mg/dL (ref 0.2–1.2)
Total Protein: 7.2 g/dL (ref 6.0–8.3)

## 2014-05-24 NOTE — Progress Notes (Signed)
Quick Note:  Pt aware of results/recs. Already has appt with PCP to discuss blood sugar and potassium. Just a fyi. ______

## 2014-05-25 ENCOUNTER — Ambulatory Visit (INDEPENDENT_AMBULATORY_CARE_PROVIDER_SITE_OTHER): Payer: BC Managed Care – PPO | Admitting: Family Medicine

## 2014-05-25 ENCOUNTER — Encounter: Payer: Self-pay | Admitting: Family Medicine

## 2014-05-25 VITALS — BP 120/64 | HR 85 | Temp 97.4°F | Ht 66.0 in | Wt 264.0 lb

## 2014-05-25 DIAGNOSIS — E1165 Type 2 diabetes mellitus with hyperglycemia: Secondary | ICD-10-CM

## 2014-05-25 DIAGNOSIS — E876 Hypokalemia: Secondary | ICD-10-CM | POA: Insufficient documentation

## 2014-05-25 DIAGNOSIS — E7800 Pure hypercholesterolemia, unspecified: Secondary | ICD-10-CM

## 2014-05-25 DIAGNOSIS — IMO0002 Reserved for concepts with insufficient information to code with codable children: Secondary | ICD-10-CM

## 2014-05-25 DIAGNOSIS — I10 Essential (primary) hypertension: Secondary | ICD-10-CM

## 2014-05-25 DIAGNOSIS — E348 Other specified endocrine disorders: Secondary | ICD-10-CM

## 2014-05-25 LAB — BASIC METABOLIC PANEL
BUN: 18 mg/dL (ref 6–23)
CALCIUM: 9.8 mg/dL (ref 8.4–10.5)
CO2: 27 meq/L (ref 19–32)
Chloride: 101 mEq/L (ref 96–112)
Creatinine, Ser: 0.8 mg/dL (ref 0.4–1.2)
GFR: 73.68 mL/min (ref >60.00)
Glucose, Bld: 176 mg/dL — ABNORMAL HIGH (ref 70–99)
Potassium: 3.7 mEq/L (ref 3.5–5.1)
Sodium: 139 mEq/L (ref 135–145)

## 2014-05-25 LAB — HEMOGLOBIN A1C: HEMOGLOBIN A1C: 4.5 % — AB (ref 4.6–6.5)

## 2014-05-25 LAB — HM DIABETES FOOT EXAM

## 2014-05-25 MED ORDER — SAXAGLIPTIN HCL 5 MG PO TABS: 5.0000 mg | ORAL_TABLET | Freq: Every day | ORAL | Status: DC

## 2014-05-25 NOTE — Assessment & Plan Note (Signed)
Well controlled. Continue current medication. If potassium remains low, may be due to HCTZ.

## 2014-05-25 NOTE — Assessment & Plan Note (Addendum)
Increase potassium in diet. ? Due partially from  HCTZ. Recheck today.

## 2014-05-25 NOTE — Progress Notes (Signed)
   Subjective:    Patient ID: Kaitlin Hamilton, female    DOB: [birth year], 63 y.o.   MRN: [id number]  HPI  63 year old female pt with UIP on chronic prednisone with previously diet controlled type 2 DM  presents with recent elevations in glucose in last few weeks and low potassium levels.  On glipizide max.  Se to metformin in past.  At recent ED visit 04/27/2014 for ureteral stone she was noted to have a CBG of 281. At pulmonology at 11/3  201 She has noted FBS 170-200  Potassium at pulmonologist potassium was 3.4 on 11/3  On ACEI, diuretic  For BP.  Lab Results  Component Value Date   HGBA1C 4.0* 02/03/2014    She starts pulmonary rehab three times a week tommorow.  She plans to set up nutritionist visit. She has been drinking more water.  Review of Systems  Constitutional: Negative for fever and fatigue.  HENT: Negative for ear pain.   Eyes: Negative for pain.  Respiratory: Negative for chest tightness and shortness of breath.   Cardiovascular: Negative for chest pain, palpitations and leg swelling.  Gastrointestinal: Negative for abdominal pain.  Genitourinary: Negative for dysuria.       Objective:   Physical Exam  Constitutional: Vital signs are normal. She appears well-developed and well-nourished. She is cooperative.  Non-toxic appearance. She does not appear ill. No distress.  obese  HENT:  Head: Normocephalic.  Right Ear: Hearing, tympanic membrane, external ear and ear canal normal.  Left Ear: Hearing, tympanic membrane, external ear and ear canal normal.  Nose: Nose normal.  Eyes: Conjunctivae, EOM and lids are normal. Pupils are equal, round, and reactive to light. Lids are everted and swept, no foreign bodies found.  Neck: Trachea normal and normal range of motion. Neck supple. Carotid bruit is not present. No thyroid mass and no thyromegaly present.  Cardiovascular: Normal rate, regular rhythm, S1 normal, S2 normal, normal heart sounds and intact distal  pulses.  Exam reveals no gallop.   No murmur heard. Pulmonary/Chest: Effort normal. No respiratory distress. She has decreased breath sounds in the right upper field, the right middle field, the right lower field, the left upper field, the left middle field and the left lower field. She has no wheezes. She has no rhonchi. She has no rales.  Abdominal: Soft. Normal appearance and bowel sounds are normal. She exhibits no distension, no fluid wave, no abdominal bruit and no mass. There is no hepatosplenomegaly. There is no tenderness. There is no rebound, no guarding and no CVA tenderness. No hernia.  Lymphadenopathy:    She has no cervical adenopathy.    She has no axillary adenopathy.  Neurological: She is alert. She has normal strength. No cranial nerve deficit or sensory deficit.  Skin: Skin is warm, dry and intact. No rash noted.  Psychiatric: Her speech is normal and behavior is normal. Judgment normal. Her mood appears not anxious. Cognition and memory are normal. She does not exhibit a depressed mood.   Diabetic foot exam: Normal inspection No skin breakdown No calluses  Normal DP pulses Normal sensation to light touch and monofilament Nails normal         Assessment & Plan:

## 2014-05-25 NOTE — Assessment & Plan Note (Signed)
Check A1C. CBGs only been high in last few weeks, likely over honeymoon periods.  Start onglyza. Refer to MNT.  Start pulm rehab as exercise.  Work on weight loss.  info on diet given ion detail.

## 2014-05-25 NOTE — Progress Notes (Signed)
Pre visit review using our clinic review tool, if applicable. No additional management support is needed unless otherwise documented below in the visit note. 

## 2014-05-25 NOTE — Patient Instructions (Addendum)
Increase exercise as planned. Work on low [employer]. Stop at front desk for nutrition referral.  Stop at lab on way out. Start  onglyza daily in AM. Continue glipizide Keep appt in 06/2014 as planned but cancel proceeding labs.

## 2014-05-31 ENCOUNTER — Telehealth: Payer: Self-pay | Admitting: Pulmonary Disease

## 2014-05-31 NOTE — Telephone Encounter (Signed)
noted 

## 2014-05-31 NOTE — Telephone Encounter (Signed)
Spoke with patient- aware that I am sending message to BQ and Caryl Pina to be on the lookout for form coming over for BQ to sign.

## 2014-06-07 ENCOUNTER — Telehealth: Payer: Self-pay | Admitting: *Deleted

## 2014-06-07 NOTE — Telephone Encounter (Signed)
Will sign and forward to [place] to be on the lookout for form

## 2014-06-07 NOTE — Telephone Encounter (Signed)
Received fax from [facility] for Diabetic testing supplies.  Called Aneka to verify that she did request theses supplies from [place]. Makylah states the gentleman on the phone told her that [facility] would pay for these supplies so she would like Dr. [name] to complete the paperwork.  Form placed in Dr. Rometta Emery in box for signature.

## 2014-06-08 NOTE — Telephone Encounter (Signed)
Form faxed to [facility] [phone].

## 2014-06-12 ENCOUNTER — Ambulatory Visit: Payer: Self-pay | Admitting: Family Medicine

## 2014-06-13 ENCOUNTER — Ambulatory Visit: Payer: BC Managed Care – PPO | Admitting: Family Medicine

## 2014-06-13 ENCOUNTER — Other Ambulatory Visit: Payer: BC Managed Care – PPO

## 2014-06-19 ENCOUNTER — Other Ambulatory Visit (INDEPENDENT_AMBULATORY_CARE_PROVIDER_SITE_OTHER): Payer: BC Managed Care – PPO

## 2014-06-19 ENCOUNTER — Ambulatory Visit (INDEPENDENT_AMBULATORY_CARE_PROVIDER_SITE_OTHER)
Admission: RE | Admit: 2014-06-19 | Discharge: 2014-06-19 | Disposition: A | Discharge status: Home / Self Care | Payer: BC Managed Care – PPO | Source: Ambulatory Visit | Attending: Pulmonary Disease | Admitting: Pulmonary Disease

## 2014-06-19 ENCOUNTER — Encounter: Payer: Self-pay | Admitting: Pulmonary Disease

## 2014-06-19 ENCOUNTER — Ambulatory Visit (INDEPENDENT_AMBULATORY_CARE_PROVIDER_SITE_OTHER): Payer: BC Managed Care – PPO | Admitting: Pulmonary Disease

## 2014-06-19 ENCOUNTER — Telehealth: Payer: Self-pay | Admitting: Pulmonary Disease

## 2014-06-19 VITALS — BP 138/76 | HR 86 | Ht 66.0 in | Wt 266.0 lb

## 2014-06-19 DIAGNOSIS — R05 Cough: Secondary | ICD-10-CM

## 2014-06-19 DIAGNOSIS — J9611 Chronic respiratory failure with hypoxia: Secondary | ICD-10-CM

## 2014-06-19 DIAGNOSIS — R059 Cough, unspecified: Secondary | ICD-10-CM

## 2014-06-19 DIAGNOSIS — J209 Acute bronchitis, unspecified: Secondary | ICD-10-CM | POA: Insufficient documentation

## 2014-06-19 DIAGNOSIS — J84112 Idiopathic pulmonary fibrosis: Secondary | ICD-10-CM

## 2014-06-19 DIAGNOSIS — Z5181 Encounter for therapeutic drug level monitoring: Secondary | ICD-10-CM

## 2014-06-19 DIAGNOSIS — G4733 Obstructive sleep apnea (adult) (pediatric): Secondary | ICD-10-CM

## 2014-06-19 LAB — COMPREHENSIVE METABOLIC PANEL
ALK PHOS: 62 U/L (ref 39–117)
ALT: 27 U/L (ref 0–35)
AST: 21 U/L (ref 0–37)
Albumin: 4.2 g/dL (ref 3.5–5.2)
BILIRUBIN TOTAL: 0.7 mg/dL (ref 0.2–1.2)
BUN: 17 mg/dL (ref 6–23)
CO2: 23 mEq/L (ref 19–32)
Calcium: 9.3 mg/dL (ref 8.4–10.5)
Chloride: 104 mEq/L (ref 96–112)
Creatinine, Ser: 0.8 mg/dL (ref 0.4–1.2)
GFR: 80.33 mL/min (ref >60.00)
Glucose, Bld: 156 mg/dL — ABNORMAL HIGH (ref 70–99)
Potassium: 3.6 mEq/L (ref 3.5–5.1)
SODIUM: 135 meq/L (ref 135–145)
TOTAL PROTEIN: 6.7 g/dL (ref 6.0–8.3)

## 2014-06-19 MED ORDER — BENZONATATE 200 MG PO CAPS: 200.0000 mg | ORAL_CAPSULE | Freq: Three times a day (TID) | ORAL | Status: DC | PRN

## 2014-06-19 MED ORDER — DOXYCYCLINE HYCLATE 100 MG PO TABS: 100.0000 mg | ORAL_TABLET | Freq: Two times a day (BID) | ORAL | Status: DC

## 2014-06-19 NOTE — Progress Notes (Signed)
Quick Note:  Pt aware of results. ______ 

## 2014-06-19 NOTE — Assessment & Plan Note (Signed)
Kaitlin Hamilton appears to have simple, uncomplicated acute bronchitis. However, her oxygen needs are up a bit and considering her immunosuppression I am inclined to treat her with antibiotics sooner rather than later.  Plan:  -chest x-ray today  -doxycycline 7 days with a probiotic -Tessalon as needed for cough

## 2014-06-19 NOTE — Telephone Encounter (Signed)
Pt c/o having chest congestion, cough since Sat. Appt set for today at 3:30pm. Sebewaing Bing, CMA

## 2014-06-19 NOTE — Assessment & Plan Note (Signed)
Continue CPAP as written.

## 2014-06-19 NOTE — Assessment & Plan Note (Signed)
Continue prednisone and CellCept as outlined in my previous note. We will plan for full dose treatment for 6 months assuming 02/18/2014 was the first day of full dose treatment.  Because she appears a bit pale today and going to check a CBC as well as a cemented for therapeutic drug monitoring as the CellCept can cause anemia.  Plan: -CBC today, CMET today -continue prednisone 10mg  daily, cellcept 1500mg  bid, dapsone

## 2014-06-19 NOTE — Patient Instructions (Addendum)
Take the doxycycline for a week We will send a letter to [facility] so you can exercise on up to 8 Liters per minute of oxygen Take tessalon as needed for cough For your oxygen> Use 4L on exertion, 3L at rest We will see you back in 2 weeks or sooner if needed

## 2014-06-19 NOTE — Progress Notes (Signed)
Subjective:    Patient ID: Kaitlin Hamilton, female    DOB: 1951-02-20, 63 y.o.   MRN: [id number]  Synopsis: Kaitlin Hamilton is a very pleasant 63 year old female who first saw the [facility] pulmonary clinic in March 2014 for evaluation of shortness of breath. She had significant cough, wheezing, and sputum production requiring multiple rounds of antibiotics. Because of crackles on lung exam and an abnormal pulmonary function test she was referred to [place]. We ordered full pulmonary function testing which showed moderate restriction and a depressed DLCO in proportion to her restriction. There is no airflow obstruction and no change with bronchodilator administration. A chest x-ray performed in February 2014 was read as normal.  An open lung biopsy was performed in December 2014 showing UIP.  Because she has had multiple good responses to steroids, we have treated her with cellcept and prednisone.  In January 2015 she had severe insomnia, hallucinations and gastritis on high dose prednisone.  She had a rash to bactrim. Since February 2015 she has been on cellcept and low dose prednisone and dapsone.  HPI  Chief Complaint  Patient presents with  . Acute Visit    Pt c/o PND, nosebleeds, prod cough with green mucus since Saturday.      06/19/2014 ROV > Kaitlin Hamilton says that she has been suffering from a cold recently.  About three days ago she started having a cough with mucus production which is light green in color.  She has been havng nose bleeding as well.  Her blood sugar control improved sicne taking the Onglyza.  She has not been running a fever but she has been having chills at home.  She has been going to exercise classes and her O2 level has been dropping there and they have been increasing her oxygen level to 6Lpm, sometimes 8Lpm, specifically when on a treadmill.  She says that she has been feeling better since going to the gym.  She has lost about 4 pounds.  She continues to take her cellcept and  prednisone.     Past Medical History  Diagnosis Date  . Bronchitis   . Chicken pox   . Depression   . Allergic rhinitis     uses Flonase daily  . Hyperlipidemia     lost 43 pounds and no meds required at present  . Hypokalemia   . Anxiety   . PONV (postoperative nausea and vomiting)   . Hypertension     takes Verapamil and HCTZ daily  . Shortness of breath     with exertion;takes Singulair daily as well as Flonase  . Pneumonia     last time in 2006  . History of bronchitis     2013  . H/O hiatal hernia   . Migraines     last one about a month ago  . Dysphagia   . Joint swelling     left thumb  . OA (osteoarthritis)     left knee  . GERD (gastroesophageal reflux disease)     takes Protonix daily  . Hemorrhoids   . History of colon polyps   . Diverticulosis   . Urinary frequency   . History of kidney stones   . Non-insulin dependent type 2 diabetes mellitus     takes Glipizide daily  . Constipation     takes Fiber daily  . Hypothyroidism (acquired)     takes Synthroid daily  . Insomnia     doesn't take any meds for this  Review of Systems  Constitutional: Negative for fever, chills, activity change and fatigue.  HENT: Negative for congestion, ear pain, nosebleeds, postnasal drip, rhinorrhea and sinus pressure.   Respiratory: Positive for cough and shortness of breath. Negative for wheezing.   Cardiovascular: Negative for chest pain, palpitations and leg swelling.  Gastrointestinal: Negative for nausea and abdominal pain.       Objective:   Physical Exam   Filed Vitals:   06/19/14 1525  BP: 138/76  Pulse: 86  Height: '5\' 6"'  (1.676 m)  Weight: 266 lb (120.657 kg)  SpO2: 92%  4L   Ambulate 500 feet on 4 L nasal cannula and O2 saturation dropped slightly to 87%. It did recover quickly with rest.  Gen:  no acute distress  HEENT: NCAT,  EOMi, OP clear,  PULM: Crackles 1/2 way up bilaterally CV: RRR, no mgr, no JVD AB: BS+, soft, nontender,  no hsm Ext: warm, no edema, no clubbing Neuro: A&Ox4, maew  February 2014 simple spirometry performed by her primary care physician>> ratio 90%, FEV1 1.71 L (64% predicted, FVC 1.83 L 55% predicted; flow volume loop is not consistent with obstruction February 2014 chest x-[facility] at [facility] normal 10/19/2012 walked 500 feet in office on room air oxygenation did not drop below 90% 11/15/2012 Full PFT [name]> Ratio 87%, FEV1 2.00L > 2.07 with bronchodilator (3% change); TLC 3.44 L (65% pred), ERV 0.62 (57% pred), DLCO 15.1 ml/mmHg/min (59% pred) 11/2012 CT chest >> (Rekisha Welling read) centrilobular nodules, interlobular septal thickening worse in bases and periphery R lung > L; some GGO in bases and periphery as well, some bronchiectasis in the bases R > L; findings suggestive of fibrosis but not UIP; question hypersensitivity pneumonitis given centrilobular nodules; also question aspiration 03/2013 ANA, ANCA, Anti-Jo-1, ESR, RF, SCL-70, anti-centromere, SSA/SSB, all negative; CRP 0.6;  04/2013 Barium swallow> abnormal esophageal motility, GERD, hiatal hernia 04/2013 Full PFT> Ratio 93%, FEV1 1.92 L (71% pred), TLC 3.02L (56% pred), DLCO 15.94 (59% pred) 04/2013 CT chest ([name])> findings suggestive of but not diagnostic of NSIP, small pulmonary nodule 04/2013 6MW RA > 1100 feet, HR peak 109, O2 sat Nadir 87% 04/2013 HP panel >> 05/17/2013 3 month prednisone trial 06/2013 open lung biopsy> UIP 07/28/13 Cell cept and bactrim started > bactrim stopped 07/29/13 due to rash  07/2013 hospitalized for confusion > prednisone 07/2013 hospitalized for gastritis/abdominal pain> prednisone 08/22/2013 atovaquone caused rash 11/2013 6MW 1364 feet, O2 saturation nadir 78% May 2015 full pulmonary function test ratio 93%, FEV1 1.71 L (64% predicted, no change with bronchodilator), total lung capacity 2.67 L (49% predicted), DLCO 13.11 (40% predicted)      Assessment & Plan:   OSA (obstructive sleep apnea) Continue CPAP as  written.  Acute bronchitis Kaitlin Hamilton appears to have simple, uncomplicated acute bronchitis. However, her oxygen needs are up a bit and considering her immunosuppression I am inclined to treat her with antibiotics sooner rather than later.  Plan:  -chest x-ray today  -doxycycline 7 days with a probiotic -Tessalon as needed for cough  Chronic hypoxemic respiratory failure It appears that her oxygenation has worsened.  I hope that this is just related to the acute bronchitis and does not represent worsening pulmonary fibrosis.  Plan: -Use 3 L of oxygen at rest and 4 L with exertion, it is okay for her to use up to 8 L/m when exercising vigorously at pulmonary rehabilitation  UIP (usual interstitial pneumonitis) Continue prednisone and CellCept as outlined in my previous note. We will  plan for full dose treatment for 6 months assuming 02/18/2014 was the first day of full dose treatment.  Because she appears a bit pale today and going to check a CBC as well as a cemented for therapeutic drug monitoring as the CellCept can cause anemia.  Plan: -CBC today, CMET today -continue prednisone 61m daily, cellcept 15058mbid, dapsone    Updated Medication List Outpatient Encounter Prescriptions as of 06/19/2014  Medication Sig  . acetaminophen (TYLENOL) 500 MG tablet Take 1,000 mg by mouth every 6 (six) hours as needed for moderate pain.  . Azelastine-Fluticasone (DYMISTA) 137-50 MCG/ACT SUSP One puff per nostril twice daily  . dapsone 100 MG tablet Take 100 mg by mouth daily.  . diphenhydrAMINE (BENADRYL) 25 mg capsule Take 25 mg by mouth as needed.  . doxycycline (VIBRAMYCIN) 100 MG capsule Take 100 mg by mouth 2 (two) times daily.  . Marland KitchenIBER PO Take 1 tablet by mouth daily.  . Marland KitchenlipiZIDE (GLUCOTROL) 10 MG tablet Take 1 tablet (10 mg total) by mouth 2 (two) times daily before a meal.  . hydrocortisone cream 0.5 % Apply 1 application topically 2 (two) times daily as needed (for rash).  . Marland Kitchenibuprofen (ADVIL,MOTRIN) 200 MG tablet Take 200 mg by mouth as needed.  . Marland Kitchenevothyroxine (SYNTHROID, LEVOTHROID) 125 MCG tablet Take 125 mcg by mouth daily before breakfast.  . losartan-hydrochlorothiazide (HYZAAR) 50-12.5 MG per tablet Take 1 tablet by mouth daily.  . montelukast (SINGULAIR) 10 MG tablet Take 10 mg by mouth at bedtime.  . mycophenolate (CELLCEPT) 500 MG tablet Take 3 tablets (1,500 mg total) by mouth 2 (two) times daily.  . NON FORMULARY Place 2 L into the nose daily. 3 Liters with exertion  . ONE TOUCH ULTRA TEST test strip   . pantoprazole (PROTONIX) 40 MG tablet Take 40 mg by mouth 2 (two) times daily.   . predniSONE (DELTASONE) 10 MG tablet Take 1 tablet (10 mg total) by mouth daily with breakfast.  . saxagliptin HCl (ONGLYZA) 5 MG TABS tablet Take 1 tablet (5 mg total) by mouth daily.  . verapamil (VERELAN PM) 180 MG 24 hr capsule Take 180 mg by mouth daily.  . benzonatate (TESSALON) 200 MG capsule Take 1 capsule (200 mg total) by mouth 3 (three) times daily as needed for cough.  . doxycycline (VIBRA-TABS) 100 MG tablet Take 1 tablet (100 mg total) by mouth 2 (two) times daily.

## 2014-06-19 NOTE — Assessment & Plan Note (Signed)
It appears that her oxygenation has worsened.  I hope that this is just related to the acute bronchitis and does not represent worsening pulmonary fibrosis.  Plan: -Use 3 L of oxygen at rest and 4 L with exertion, it is okay for her to use up to 8 L/m when exercising vigorously at pulmonary rehabilitation

## 2014-06-20 ENCOUNTER — Encounter: Payer: Self-pay | Admitting: Pulmonary Disease

## 2014-06-20 ENCOUNTER — Ambulatory Visit: Payer: Self-pay | Admitting: Family Medicine

## 2014-06-20 ENCOUNTER — Ambulatory Visit (INDEPENDENT_AMBULATORY_CARE_PROVIDER_SITE_OTHER): Payer: BC Managed Care – PPO | Admitting: Family Medicine

## 2014-06-20 ENCOUNTER — Encounter: Payer: Self-pay | Admitting: Family Medicine

## 2014-06-20 VITALS — BP 114/60 | HR 85 | Temp 98.1°F | Ht 66.0 in | Wt 264.2 lb

## 2014-06-20 DIAGNOSIS — R233 Spontaneous ecchymoses: Secondary | ICD-10-CM

## 2014-06-20 DIAGNOSIS — IMO0002 Reserved for concepts with insufficient information to code with codable children: Secondary | ICD-10-CM

## 2014-06-20 DIAGNOSIS — R238 Other skin changes: Secondary | ICD-10-CM | POA: Insufficient documentation

## 2014-06-20 DIAGNOSIS — E1165 Type 2 diabetes mellitus with hyperglycemia: Secondary | ICD-10-CM

## 2014-06-20 LAB — CBC WITH DIFFERENTIAL/PLATELET
Basophils Absolute: 0.1 10*3/uL (ref 0.0–0.1)
Basophils Relative: 0.3 % (ref 0.0–3.0)
EOS PCT: 0.8 % (ref 0.0–5.0)
Eosinophils Absolute: 0.1 10*3/uL (ref 0.0–0.7)
HEMATOCRIT: 33.5 % — AB (ref 36.0–46.0)
Hemoglobin: 10.5 g/dL — ABNORMAL LOW (ref 12.0–15.0)
LYMPHS ABS: 1.5 10*3/uL (ref 0.7–4.0)
Lymphocytes Relative: 9.5 % — ABNORMAL LOW (ref 12.0–46.0)
MCHC: 31.4 g/dL (ref 30.0–36.0)
MCV: 99.7 fl (ref 78.0–100.0)
MONO ABS: 0.5 10*3/uL (ref 0.1–1.0)
Monocytes Relative: 3 % (ref 3.0–12.0)
Neutro Abs: 13.9 10*3/uL — ABNORMAL HIGH (ref 1.4–7.7)
Neutrophils Relative %: 86.4 % — ABNORMAL HIGH (ref 43.0–77.0)
Platelets: 339 10*3/uL (ref 150.0–400.0)
RBC: 3.36 Mil/uL — AB (ref 3.87–5.11)
RDW: 16.7 % — ABNORMAL HIGH (ref 11.5–15.5)
WBC: 16.1 10*3/uL — ABNORMAL HIGH (ref 4.0–10.5)

## 2014-06-20 NOTE — Assessment & Plan Note (Signed)
Improved control on onglyza in additon to glipizide.  Encouraged exercise, weight loss, healthy eating habits.

## 2014-06-20 NOTE — Patient Instructions (Signed)
Stop by lab on way put to have cbc drawn given accidentally not drawn at pulm yesterday.  Go to ER if nose bleeding > 5 minutes. For now continue onglyza.  Keep up great work on healthy eating , plum rehab and weight loss.  Follow up DM in 2 months with labs prior.

## 2014-06-20 NOTE — Assessment & Plan Note (Signed)
Phlebotomist did not drw cbc as ordered at [facility].  Will send for cbc diff ASAP.   She is on cellcept witch can cause thrombocytopenia etc.  Also recet start of onglyza which can cause  (< 1% ) ITP.

## 2014-06-20 NOTE — Progress Notes (Signed)
   Subjective:    Patient ID: Kaitlin Hamilton, female    DOB: 1951/07/08, 63 y.o.   MRN: [id number]  HPI  63 year old female presents for 1 month follow up. At last OV blood sugars had been increasing. She is on daily prednsione  Diabetes:  Started onglyza in addition to  Max glipizide. SE to metformin in past. She has noted some bruising easily and nosebleeds lately in last few weeks. Referred to MNT. Started pulm rehab. Using medications without difficulties: Hypoglycemic episodes:None Hyperglycemic episodes:None Feet problems:None Blood Sugars averaging:FBS 83-147 She has been improving her diet tremendously. Decreased carbs and fried foods.  Wt Readings from Last 3 Encounters:  06/20/14 264 lb 4 oz (119.863 kg)  06/19/14 266 lb (120.657 kg)  05/25/14 264 lb (119.75 kg)    Potassium nml on recheck. She has been increasing potassium in diet.   Review of Systems  Constitutional: Negative for fever and fatigue.  HENT: Positive for nosebleeds. Negative for ear pain.   Eyes: Negative for pain.  Respiratory: Negative for chest tightness and shortness of breath.   Cardiovascular: Negative for chest pain, palpitations and leg swelling.  Gastrointestinal: Negative for abdominal pain and blood in stool.  Genitourinary: Negative for dysuria and hematuria.  Skin: Positive for rash.       brusing in several area of abdomen.       Objective:   Physical Exam  Constitutional: Vital signs are normal. She appears well-developed and well-nourished. She is cooperative.  Non-toxic appearance. She does not appear ill. No distress.  HENT:  Head: Normocephalic.  Right Ear: Hearing, tympanic membrane, external ear and ear canal normal. Tympanic membrane is not erythematous, not retracted and not bulging.  Left Ear: Hearing, tympanic membrane, external ear and ear canal normal. Tympanic membrane is not erythematous, not retracted and not bulging.  Nose: No mucosal edema or rhinorrhea. Right sinus  exhibits no maxillary sinus tenderness and no frontal sinus tenderness. Left sinus exhibits no maxillary sinus tenderness and no frontal sinus tenderness.  Mouth/Throat: Uvula is midline, oropharynx is clear and moist and mucous membranes are normal.  Eyes: Conjunctivae, EOM and lids are normal. Pupils are equal, round, and reactive to light. Lids are everted and swept, no foreign bodies found.  Neck: Trachea normal and normal range of motion. Neck supple. Carotid bruit is not present. No thyroid mass and no thyromegaly present.  Cardiovascular: Normal rate, regular rhythm, S1 normal, S2 normal, normal heart sounds, intact distal pulses and normal pulses.  Exam reveals no gallop and no friction rub.   No murmur heard. Pulmonary/Chest: Effort normal and breath sounds normal. No tachypnea. No respiratory distress. She has no decreased breath sounds. She has no wheezes. She has no rhonchi. She has no rales.  Abdominal: Soft. Normal appearance and bowel sounds are normal. There is no tenderness.  bruising on abdomen  Neurological: She is alert.  Skin: Skin is warm, dry and intact. No rash noted.  Psychiatric: Her speech is normal and behavior is normal. Judgment and thought content normal. Her mood appears not anxious. Cognition and memory are normal. She does not exhibit a depressed mood.          Assessment & Plan:

## 2014-06-20 NOTE — Progress Notes (Signed)
Pre visit review using our clinic review tool, if applicable. No additional management support is needed unless otherwise documented below in the visit note. 

## 2014-06-21 NOTE — Progress Notes (Signed)
Quick Note:  Pt aware of results. ______ 

## 2014-06-27 ENCOUNTER — Telehealth: Payer: Self-pay | Admitting: Pulmonary Disease

## 2014-06-27 NOTE — Telephone Encounter (Signed)
Per 06/19/14 OV: Patient Instructions     Take the doxycycline for a week We will send a letter to [facility] so you can exercise on up to 8 Liters per minute of oxygen Take tessalon as needed for cough For your oxygen> Use 4L on exertion, 3L at rest We will see you back in 2 weeks or sooner if needed   Spoke with [facility]. She needs an updated order with pt new O2 liter flow. She also reports pt advised [facility] Dr. [name] wants her evaluated for portable O2. I do not see this mentioned about portable 02. Please advise BQ thanks

## 2014-06-28 ENCOUNTER — Ambulatory Visit: Payer: Self-pay | Admitting: Family Medicine

## 2014-06-28 ENCOUNTER — Other Ambulatory Visit: Payer: Self-pay | Admitting: *Deleted

## 2014-06-28 MED ORDER — DAPSONE 100 MG PO TABS: 100.0000 mg | ORAL_TABLET | Freq: Every day | ORAL | Status: DC

## 2014-06-29 ENCOUNTER — Encounter: Payer: Self-pay | Admitting: Family Medicine

## 2014-06-30 ENCOUNTER — Encounter: Payer: Self-pay | Admitting: Family Medicine

## 2014-07-03 ENCOUNTER — Encounter: Payer: Self-pay | Admitting: Pulmonary Disease

## 2014-07-03 ENCOUNTER — Ambulatory Visit (INDEPENDENT_AMBULATORY_CARE_PROVIDER_SITE_OTHER): Payer: BC Managed Care – PPO | Admitting: Pulmonary Disease

## 2014-07-03 VITALS — BP 110/60 | HR 86 | Ht 66.0 in | Wt 260.0 lb

## 2014-07-03 DIAGNOSIS — R21 Rash and other nonspecific skin eruption: Secondary | ICD-10-CM | POA: Insufficient documentation

## 2014-07-03 DIAGNOSIS — J84112 Idiopathic pulmonary fibrosis: Secondary | ICD-10-CM

## 2014-07-03 DIAGNOSIS — J9611 Chronic respiratory failure with hypoxia: Secondary | ICD-10-CM

## 2014-07-03 NOTE — Assessment & Plan Note (Signed)
We adjusted her O2 prescription to 3Lpm with rest and 8Lpm with exercise at pulmonary rehab.

## 2014-07-03 NOTE — Assessment & Plan Note (Signed)
I remain concern about the progression in Kaitlin Hamilton's symptoms and oxygen needs.  She has severe disease which appears to be progressing.  As stated previously, we will plan to continue treatment with full dose cellcept and prednisone through Feb 2016.  If there is clear evidence of progression then we will need to change to an anti-fibrotic treatment with either Ofev or Pirfenidone.  Plan: -CT thorax and PFT in early February 2016 -f/u with me after that -continue cellcept and prednisone and dapsone -continue monthly CMET and CBC due to cellcept

## 2014-07-03 NOTE — Patient Instructions (Signed)
Take generic lamisil cream for the rash We will change your oxygen prescription to use up to 8Lpm with exertion and 3L while at rest We will see you back in February 2016

## 2014-07-03 NOTE — Progress Notes (Signed)
Subjective:    Patient ID: Kaitlin Hamilton, female    DOB: November 14, 1950, 63 y.o.   MRN: [id number]  Synopsis: Kaitlin Hamilton is a very pleasant 63 year old female who first saw the [facility] pulmonary clinic in March 2014 for evaluation of shortness of breath. She had significant cough, wheezing, and sputum production requiring multiple rounds of antibiotics. Because of crackles on lung exam and an abnormal pulmonary function test she was referred to [place]. We ordered full pulmonary function testing which showed moderate restriction and a depressed DLCO in proportion to her restriction. There is no airflow obstruction and no change with bronchodilator administration. A chest x-ray performed in February 2014 was read as normal.  An open lung biopsy was performed in December 2014 showing UIP.  Because she has had multiple good responses to steroids, we have treated her with cellcept and prednisone.  In January 2015 she had severe insomnia, hallucinations and gastritis on high dose prednisone.  She had a rash to bactrim. Since February 2015 she has been on cellcept and low dose prednisone and dapsone.  HPI  Chief Complaint  Patient presents with  . Follow-up    pt c/o sob, some cough and wheezing. pt denies chest tightness. She is concerned about 02 settings at 3l.   Kaitlin Hamilton is feeling better.  Her cough and mucus production improved.  She still has some green mucus production but it has improved.  She continues to work hard on pulmonary rehab and has been using as much as 8L of O2 with exertion.  Apparently her O2 saturation dropped when she was using 6Lpm when she was on the bicycle.  She has been getting help around the house with her activities and routing household activities.  She has had a hard time getting oxygen from [facility].     Past Medical History  Diagnosis Date  . Bronchitis   . Chicken pox   . Depression   . Allergic rhinitis     uses Flonase daily  . Hyperlipidemia    lost 43 pounds and no meds required at present  . Hypokalemia   . Anxiety   . PONV (postoperative nausea and vomiting)   . Hypertension     takes Verapamil and HCTZ daily  . Shortness of breath     with exertion;takes Singulair daily as well as Flonase  . Pneumonia     last time in 2006  . History of bronchitis     2013  . H/O hiatal hernia   . Migraines     last one about a month ago  . Dysphagia   . Joint swelling     left thumb  . OA (osteoarthritis)     left knee  . GERD (gastroesophageal reflux disease)     takes Protonix daily  . Hemorrhoids   . History of colon polyps   . Diverticulosis   . Urinary frequency   . History of kidney stones   . Non-insulin dependent type 2 diabetes mellitus     takes Glipizide daily  . Constipation     takes Fiber daily  . Hypothyroidism (acquired)     takes Synthroid daily  . Insomnia     doesn't take any meds for this        Review of Systems  Constitutional: Negative for fever, chills, activity change and fatigue.  HENT: Negative for congestion, ear pain, nosebleeds, postnasal drip, rhinorrhea and sinus pressure.   Respiratory: Positive for cough and shortness  of breath. Negative for wheezing.   Cardiovascular: Negative for chest pain, palpitations and leg swelling.  Gastrointestinal: Negative for nausea and abdominal pain.       Objective:   Physical Exam   Filed Vitals:   07/03/14 1400  BP: 110/60  Pulse: 86  Height: '5\' 6"'  (1.676 m)  Weight: 260 lb (117.935 kg)  SpO2: 90%  3L [place]   Gen:  no acute distress  HEENT: NCAT,  EOMi, OP clear,  PULM: Crackles 1/2 way up bilaterally CV: RRR, no mgr, no JVD AB: BS+, soft, nontender, no hsm Ext: warm, no edema, no clubbing Neuro: A&Ox4, maew  February 2014 simple spirometry performed by her primary care physician>> ratio 90%, FEV1 1.71 L (64% predicted, FVC 1.83 L 55% predicted; flow volume loop is not consistent with obstruction February 2014 chest x-[facility] at [facility]  normal 10/19/2012 walked 500 feet in office on room air oxygenation did not drop below 90% 11/15/2012 Full PFT [name]> Ratio 87%, FEV1 2.00L > 2.07 with bronchodilator (3% change); TLC 3.44 L (65% pred), ERV 0.62 (57% pred), DLCO 15.1 ml/mmHg/min (59% pred) 11/2012 CT chest >> ([facility] read) centrilobular nodules, interlobular septal thickening worse in bases and periphery R lung > L; some GGO in bases and periphery as well, some bronchiectasis in the bases R > L; findings suggestive of fibrosis but not UIP; question hypersensitivity pneumonitis given centrilobular nodules; also question aspiration 03/2013 ANA, ANCA, Anti-Jo-1, ESR, RF, SCL-70, anti-centromere, SSA/SSB, all negative; CRP 0.6;  04/2013 Barium swallow> abnormal esophageal motility, GERD, hiatal hernia 04/2013 Full PFT> Ratio 93%, FEV1 1.92 L (71% pred), TLC 3.02L (56% pred), DLCO 15.94 (59% pred) 04/2013 CT chest ([name])> findings suggestive of but not diagnostic of NSIP, small pulmonary nodule 04/2013 6MW RA > 1100 feet, HR peak 109, O2 sat Nadir 87% 04/2013 HP panel >> 05/17/2013 3 month prednisone trial 06/2013 open lung biopsy> UIP 07/28/13 Cell cept and bactrim started > bactrim stopped 07/29/13 due to rash  07/2013 hospitalized for confusion > prednisone 07/2013 hospitalized for gastritis/abdominal pain> prednisone 08/22/2013 atovaquone caused rash 11/2013 6MW 1364 feet, O2 saturation nadir 78% May 2015 full pulmonary function test ratio 93%, FEV1 1.71 L (64% predicted, no change with bronchodilator), total lung capacity 2.67 L (49% predicted), DLCO 13.11 (40% predicted)      Assessment & Plan:   UIP (usual interstitial pneumonitis) I remain concern about the progression in Kaitlin Hamilton's symptoms and oxygen needs.  She has severe disease which appears to be progressing.  As stated previously, we will plan to continue treatment with full dose cellcept and prednisone through Feb 2016.  If there is clear evidence of progression then we will  need to change to an anti-fibrotic treatment with either Ofev or Pirfenidone.  Plan: -CT thorax and PFT in early February 2016 -f/u with me after that -continue cellcept and prednisone and dapsone -continue monthly CMET and CBC due to cellcept  Rash and nonspecific skin eruption She has a mild fungal eruption in her skin folds  Plan: -lamisil topical OTC -continue powder to keep dry   Over 25 minutes were spent in direct counseling with Kaitlin Hamilton and her caregiver: answering questions and giving my recommendations.  Updated Medication List Outpatient Encounter Prescriptions as of 07/03/2014  Medication Sig  . acetaminophen (TYLENOL) 500 MG tablet Take 1,000 mg by mouth every 6 (six) hours as needed for moderate pain.  . Azelastine-Fluticasone (DYMISTA) 137-50 MCG/ACT SUSP One puff per nostril twice daily  . benzonatate (TESSALON)  200 MG capsule Take 1 capsule (200 mg total) by mouth 3 (three) times daily as needed for cough.  . dapsone 100 MG tablet Take 1 tablet (100 mg total) by mouth daily.  . diphenhydrAMINE (BENADRYL) 25 mg capsule Take 25 mg by mouth as needed.  Marland Kitchen FIBER PO Take 1 tablet by mouth daily.  Marland Kitchen glipiZIDE (GLUCOTROL) 10 MG tablet Take 1 tablet (10 mg total) by mouth 2 (two) times daily before a meal.  . hydrocortisone cream 0.5 % Apply 1 application topically 2 (two) times daily as needed (for rash).  Marland Kitchen ibuprofen (ADVIL,MOTRIN) 200 MG tablet Take 200 mg by mouth as needed.  Marland Kitchen levothyroxine (SYNTHROID, LEVOTHROID) 125 MCG tablet Take 125 mcg by mouth daily before breakfast.  . losartan-hydrochlorothiazide (HYZAAR) 50-12.5 MG per tablet Take 1 tablet by mouth daily.  . montelukast (SINGULAIR) 10 MG tablet Take 10 mg by mouth at bedtime.  . mycophenolate (CELLCEPT) 500 MG tablet Take 3 tablets (1,500 mg total) by mouth 2 (two) times daily.  . NON FORMULARY Place 2 L into the nose daily. 3 Liters with exertion  . ONE TOUCH ULTRA TEST test strip   . pantoprazole  (PROTONIX) 40 MG tablet Take 40 mg by mouth 2 (two) times daily.   . predniSONE (DELTASONE) 10 MG tablet Take 1 tablet (10 mg total) by mouth daily with breakfast.  . saxagliptin HCl (ONGLYZA) 5 MG TABS tablet Take 1 tablet (5 mg total) by mouth daily.  . verapamil (VERELAN PM) 180 MG 24 hr capsule Take 180 mg by mouth daily.  . [DISCONTINUED] doxycycline (VIBRA-TABS) 100 MG tablet Take 1 tablet (100 mg total) by mouth 2 (two) times daily. (Patient not taking: Reported on 07/03/2014)

## 2014-07-03 NOTE — Assessment & Plan Note (Signed)
She has a mild fungal eruption in her skin folds  Plan: -lamisil topical OTC -continue powder to keep dry

## 2014-07-05 NOTE — Telephone Encounter (Signed)
The order was for her 02 use to be changed up to 8lpm with exertion.  No order was placed for a POC.   lmtcb X1 for Melissa to make her aware. Will route back to triage to follow up on in the a.m.

## 2014-07-05 NOTE — Telephone Encounter (Signed)
Please advise ashley thanks

## 2014-07-05 NOTE — Telephone Encounter (Signed)
Kaitlin Hamilton was supposed to send a new Rx to [facility] on Monday, please check with her I don't recall anything about a new portable order

## 2014-07-06 NOTE — Telephone Encounter (Signed)
Hissop back

## 2014-07-06 NOTE — Telephone Encounter (Signed)
Spoke with Kaitlin Hamilton  She states that she has the order and pt was given what she needed  Nothing further needed

## 2014-07-06 NOTE — Telephone Encounter (Signed)
LMOM TCB x1 for [facility]

## 2014-07-13 ENCOUNTER — Telehealth: Payer: Self-pay | Admitting: Pulmonary Disease

## 2014-07-13 DIAGNOSIS — J9611 Chronic respiratory failure with hypoxia: Secondary | ICD-10-CM

## 2014-07-13 NOTE — Telephone Encounter (Signed)
Called and spoke with pt and she stated that the small tanks that she has is not lasting long enough for her to go out and visit with her family.  She is having to take extra big tanks with her and these are wearing her out having to move these,  She stated that she will need an order sent to [facility] to pick up the other tanks and deliver the larger tanks every week or two.  Pt stated that these tanks last her longer and she will be able to spend longer time with her family. BQ please advise if we can send this order in for the pt/  Thanks  Allergies  Allergen Reactions  . Atovaquone Itching  . Codeine Hives and Swelling  . Demerol [Meperidine] Hives and Swelling  . Hydrocodone Nausea Only  . Sulfa Antibiotics Rash    Current Outpatient Prescriptions on File Prior to Visit  Medication Sig Dispense Refill  . acetaminophen (TYLENOL) 500 MG tablet Take 1,000 mg by mouth every 6 (six) hours as needed for moderate pain.    . Azelastine-Fluticasone (DYMISTA) 137-50 MCG/ACT SUSP One puff per nostril twice daily 23 g 5  . benzonatate (TESSALON) 200 MG capsule Take 1 capsule (200 mg total) by mouth 3 (three) times daily as needed for cough. 30 capsule 1  . dapsone 100 MG tablet Take 1 tablet (100 mg total) by mouth daily. 30 tablet 0  . diphenhydrAMINE (BENADRYL) 25 mg capsule Take 25 mg by mouth as needed.    Marland Kitchen FIBER PO Take 1 tablet by mouth daily.    Marland Kitchen glipiZIDE (GLUCOTROL) 10 MG tablet Take 1 tablet (10 mg total) by mouth 2 (two) times daily before a meal. 60 tablet 2  . hydrocortisone cream 0.5 % Apply 1 application topically 2 (two) times daily as needed (for rash).    Marland Kitchen ibuprofen (ADVIL,MOTRIN) 200 MG tablet Take 200 mg by mouth as needed.    Marland Kitchen levothyroxine (SYNTHROID, LEVOTHROID) 125 MCG tablet Take 125 mcg by mouth daily before breakfast.    . losartan-hydrochlorothiazide (HYZAAR) 50-12.5 MG per tablet Take 1 tablet by mouth daily. 30 tablet 0  . montelukast (SINGULAIR) 10 MG tablet Take 10 mg  by mouth at bedtime.    . mycophenolate (CELLCEPT) 500 MG tablet Take 3 tablets (1,500 mg total) by mouth 2 (two) times daily. 180 tablet 1  . NON FORMULARY Place 2 L into the nose daily. 3 Liters with exertion    . ONE TOUCH ULTRA TEST test strip     . pantoprazole (PROTONIX) 40 MG tablet Take 40 mg by mouth 2 (two) times daily.     . predniSONE (DELTASONE) 10 MG tablet Take 1 tablet (10 mg total) by mouth daily with breakfast. 30 tablet 5  . saxagliptin HCl (ONGLYZA) 5 MG TABS tablet Take 1 tablet (5 mg total) by mouth daily. 30 tablet 11  . verapamil (VERELAN PM) 180 MG 24 hr capsule Take 180 mg by mouth daily.     No current facility-administered medications on file prior to visit.

## 2014-07-13 NOTE — Telephone Encounter (Signed)
Fine by me 

## 2014-07-13 NOTE — Telephone Encounter (Signed)
Order has been placed per pts request and message has been sent to [facility] to make her aware of order placed.

## 2014-07-17 ENCOUNTER — Other Ambulatory Visit: Payer: Self-pay

## 2014-07-17 ENCOUNTER — Telehealth: Payer: Self-pay | Admitting: Pulmonary Disease

## 2014-07-17 MED ORDER — MYCOPHENOLATE MOFETIL 500 MG PO TABS: 1500.0000 mg | ORAL_TABLET | Freq: Two times a day (BID) | ORAL | Status: DC

## 2014-07-17 NOTE — Telephone Encounter (Signed)
Called and spoke with Darlina Guys with [facility] and she stated that patient was on a home fill device and request for portable gas tanks delivery and pick up was sent to manager for approval. Asked Melissa with [facility] to contact patient and advise patient on the status of request b/c we do not know what the status is. Will leave open for documentation until tomorrow. Melissa with [facility] will contact patient today. Rhonda J Cobb

## 2014-07-17 NOTE — Telephone Encounter (Signed)
Spoke with the pt  She is upset that nobody from [facility] has contacted her regarding o2  I advised that we sent the order to [facility] on 07/13/14 and will forward to them to see what the issue is  PCC's please advise thanks!

## 2014-07-19 NOTE — Telephone Encounter (Signed)
Called and spoke with Darlina Guys with [facility] and she did contact patient on 07/17/14 and speak with the patient about this issue. [facility] will start delivering E tanks to patient either bi-weekly or monthly according to her need. Nothing else needed at this time. Rhonda J Cobb

## 2014-07-21 ENCOUNTER — Encounter: Payer: Self-pay | Admitting: Pulmonary Disease

## 2014-07-24 ENCOUNTER — Telehealth: Payer: Self-pay | Admitting: Pulmonary Disease

## 2014-07-24 DIAGNOSIS — J84112 Idiopathic pulmonary fibrosis: Secondary | ICD-10-CM

## 2014-07-24 MED ORDER — MYCOPHENOLATE MOFETIL 500 MG PO TABS: 1500.0000 mg | ORAL_TABLET | Freq: Two times a day (BID) | ORAL | Status: DC

## 2014-07-24 NOTE — Telephone Encounter (Signed)
Called and spoke to pt. Pt stated she has changed insurance companies and will need her Cellcept to be sent to [facility] instead of [facility]. Rx sent to preferred pharmacy. Pt verbalized understanding and denied any further questions or concerns at this time.

## 2014-07-24 NOTE — Telephone Encounter (Signed)
lmtcb

## 2014-07-25 ENCOUNTER — Other Ambulatory Visit: Payer: Self-pay

## 2014-07-25 MED ORDER — LOSARTAN POTASSIUM-HCTZ 50-12.5 MG PO TABS: 1.0000 | ORAL_TABLET | Freq: Every day | ORAL | Status: DC

## 2014-07-25 NOTE — Telephone Encounter (Signed)
Spoke with pt - currently having monthly labs ordered by Dr. [name].  Questioning where to go now to have lab work done at the new office.    Pt aware I will call her back with response.

## 2014-07-25 NOTE — Telephone Encounter (Signed)
Pt request refill losartan-HCTZ [facility]. Advised done.

## 2014-07-26 ENCOUNTER — Telehealth: Payer: Self-pay | Admitting: Pulmonary Disease

## 2014-07-26 ENCOUNTER — Ambulatory Visit: Payer: Self-pay | Admitting: Pulmonary Disease

## 2014-07-26 LAB — CBC WITH DIFFERENTIAL/PLATELET
Basophil #: 0.1 10*3/uL (ref 0.0–0.1)
Basophil %: 0.9 %
EOS ABS: 0.2 10*3/uL (ref 0.0–0.7)
Eosinophil %: 1.2 %
HCT: 35.6 % (ref 35.0–47.0)
HGB: 10.9 g/dL — ABNORMAL LOW (ref 12.0–16.0)
LYMPHS PCT: 18.4 %
Lymphocyte #: 2.7 10*3/uL (ref 1.0–3.6)
MCH: 30.8 pg (ref 26.0–34.0)
MCHC: 30.5 g/dL — ABNORMAL LOW (ref 32.0–36.0)
MCV: 101 fL — ABNORMAL HIGH (ref 80–100)
MONOS PCT: 5 %
Monocyte #: 0.7 x10 3/mm (ref 0.2–0.9)
NEUTROS ABS: 10.9 10*3/uL — AB (ref 1.4–6.5)
NEUTROS PCT: 74.5 %
PLATELETS: 354 10*3/uL (ref 150–440)
RBC: 3.53 10*6/uL — ABNORMAL LOW (ref 3.80–5.20)
RDW: 17.4 % — AB (ref 11.5–14.5)
WBC: 14.7 10*3/uL — AB (ref 3.6–11.0)

## 2014-07-26 LAB — COMPREHENSIVE METABOLIC PANEL
ANION GAP: 10 (ref 7–16)
Albumin: 4.2 g/dL (ref 3.4–5.0)
Alkaline Phosphatase: 99 U/L
BUN: 24 mg/dL — AB (ref 7–18)
Bilirubin,Total: 0.8 mg/dL (ref 0.2–1.0)
CHLORIDE: 100 mmol/L (ref 98–107)
Calcium, Total: 9.5 mg/dL (ref 8.5–10.1)
Co2: 28 mmol/L (ref 21–32)
Creatinine: 1.07 mg/dL (ref 0.60–1.30)
EGFR (African American): >60
EGFR (Non-African Amer.): 55 — ABNORMAL LOW
GLUCOSE: 164 mg/dL — AB (ref 65–99)
OSMOLALITY: 283 (ref 275–301)
POTASSIUM: 3.6 mmol/L (ref 3.5–5.1)
SGOT(AST): 25 U/L (ref 15–37)
SGPT (ALT): 33 U/L
SODIUM: 138 mmol/L (ref 136–145)
TOTAL PROTEIN: 7.7 g/dL (ref 6.4–8.2)

## 2014-07-26 NOTE — Telephone Encounter (Signed)
Called and spoke to pt's daughter, [name]. [name] stated the pt received a device that only goes to 3lpm liter flow while pt uses up to 8lpm with exertion. [name] is questioning why [facility] brought her this O2 device knowing pt uses a high liter flow. Pt is also wanting to go back to every 2 week tank change out opposed to every 30 days. Called and spoke to [facility] to inquire more about the issue with the O2 device and tank change. Melissa stated the RT department will address the issue tomorrow (07/27/14) and will call us back. Will await call.

## 2014-07-27 ENCOUNTER — Ambulatory Visit: Payer: Self-pay | Admitting: Family Medicine

## 2014-07-27 NOTE — Telephone Encounter (Signed)
I spoke with the pt and she states she completed the labs today at [facility] hospital. I advised I will let the nurse know and we will work on getting the results. Palisade Bing, CMA

## 2014-07-27 NOTE — Telephone Encounter (Signed)
A standing monthly order for her labs for all of 2016 has been ordered and given to [facility] with the instructions to fax all results to our [place] office attn: me.  I will hold open for the next couple of days to ensure that the results are sent to [place] automatically, or if I need to request these on a monthly basis.

## 2014-07-27 NOTE — Telephone Encounter (Signed)
Thank you. Will route msg to [place] to ensure results are received.  Thank you.

## 2014-07-27 NOTE — Telephone Encounter (Signed)
lmomtcb for pt - has she had Jan monthly labs done yet?  If not, she can call [employer] and schedule for this month.  Please let me know pt's response.  Thank you.

## 2014-07-27 NOTE — Telephone Encounter (Signed)
Called and spoke to [place]. Melissa stated the POC was given to pt with the thought pt would suppliment with the tanks, if pt is unwilling to keep and use the POC then it can be returned. Melissa also stated pt is back to biweekly tank returns with 15 tanks at a time and if pt is out and needing a tank return she can always go to [facility] office to exchange tanks. Informed pt's EC, [name], of what Melissa stated. [name] verbalized understanding and denied any further questions or concerns at this time.

## 2014-07-27 NOTE — Telephone Encounter (Addendum)
[place] calling back Pt was calling about other open message about labs from 07/24/14. Will continue in that note.

## 2014-07-28 ENCOUNTER — Telehealth: Payer: Self-pay

## 2014-07-28 NOTE — Telephone Encounter (Signed)
-----   Message from Juanito Doom, MD sent at 07/28/2014  4:03 PM EST ----- A, Please let her know that her labs were OK Thanks B

## 2014-07-28 NOTE — Telephone Encounter (Signed)
Pt aware of lab results.  Nothing further needed. 

## 2014-07-31 ENCOUNTER — Telehealth: Payer: Self-pay | Admitting: Pulmonary Disease

## 2014-07-31 MED ORDER — MYCOPHENOLATE MOFETIL 500 MG PO TABS: 1500.0000 mg | ORAL_TABLET | Freq: Two times a day (BID) | ORAL | Status: DC

## 2014-07-31 NOTE — Telephone Encounter (Signed)
I spoke with the pt and she states that [facility] advised they needed [place] to call in the rx for cellcept. She provided me with the contact # of [phone]. I [facility] and called in Rx for cellcept.

## 2014-08-01 ENCOUNTER — Other Ambulatory Visit: Payer: Self-pay | Admitting: *Deleted

## 2014-08-01 MED ORDER — DAPSONE 100 MG PO TABS: 100.0000 mg | ORAL_TABLET | Freq: Every day | ORAL | Status: DC

## 2014-08-01 NOTE — Telephone Encounter (Signed)
Dapsone refilled.  Nothing further needed.

## 2014-08-07 ENCOUNTER — Other Ambulatory Visit: Payer: Self-pay | Admitting: Pulmonary Disease

## 2014-08-07 DIAGNOSIS — J9611 Chronic respiratory failure with hypoxia: Secondary | ICD-10-CM

## 2014-08-08 ENCOUNTER — Other Ambulatory Visit: Payer: Self-pay

## 2014-08-08 MED ORDER — PREDNISONE 10 MG PO TABS: 10.0000 mg | ORAL_TABLET | Freq: Every day | ORAL | Status: DC

## 2014-08-21 ENCOUNTER — Encounter: Payer: Self-pay | Admitting: Pulmonary Disease

## 2014-08-21 ENCOUNTER — Ambulatory Visit: Payer: Self-pay | Admitting: Family Medicine

## 2014-08-21 ENCOUNTER — Ambulatory Visit: Payer: Self-pay | Admitting: Pulmonary Disease

## 2014-08-24 ENCOUNTER — Ambulatory Visit (INDEPENDENT_AMBULATORY_CARE_PROVIDER_SITE_OTHER): Payer: 59 | Admitting: Pulmonary Disease

## 2014-08-24 ENCOUNTER — Telehealth: Payer: Self-pay | Admitting: Pulmonary Disease

## 2014-08-24 ENCOUNTER — Encounter: Payer: Self-pay | Admitting: Pulmonary Disease

## 2014-08-24 VITALS — BP 128/70 | HR 84 | Ht 66.0 in | Wt 257.0 lb

## 2014-08-24 DIAGNOSIS — J84112 Idiopathic pulmonary fibrosis: Secondary | ICD-10-CM

## 2014-08-24 DIAGNOSIS — J9611 Chronic respiratory failure with hypoxia: Secondary | ICD-10-CM

## 2014-08-24 DIAGNOSIS — J209 Acute bronchitis, unspecified: Secondary | ICD-10-CM

## 2014-08-24 LAB — PULMONARY FUNCTION TEST
DL/VA % pred: 83 %
DL/VA: 4.22 ml/min/mmHg/L
DLCO UNC % PRED: 40 %
DLCO UNC: 10.93 ml/min/mmHg
FEF 25-75 POST: 2.18 L/s
FEF 25-75 Pre: 3.04 L/sec
FEF2575-%Change-Post: -28 %
FEF2575-%Pred-Post: 94 %
FEF2575-%Pred-Pre: 132 %
FEV1-%CHANGE-POST: -7 %
FEV1-%PRED-POST: 54 %
FEV1-%PRED-PRE: 58 %
FEV1-POST: 1.44 L
FEV1-PRE: 1.56 L
FEV1FVC-%CHANGE-POST: 1 %
FEV1FVC-%Pred-Pre: 118 %
FEV6-%Change-Post: -8 %
FEV6-%Pred-Post: 47 %
FEV6-%Pred-Pre: 51 %
FEV6-PRE: 1.71 L
FEV6-Post: 1.56 L
FEV6FVC-%Pred-Post: 104 %
FEV6FVC-%Pred-Pre: 104 %
FVC-%CHANGE-POST: -8 %
FVC-%PRED-POST: 45 %
FVC-%PRED-PRE: 49 %
FVC-Post: 1.56 L
FVC-Pre: 1.71 L
POST FEV1/FVC RATIO: 92 %
POST FEV6/FVC RATIO: 100 %
PRE FEV6/FVC RATIO: 100 %
Pre FEV1/FVC ratio: 91 %

## 2014-08-24 MED ORDER — HYDROCOD POLST-CHLORPHEN POLST 10-8 MG/5ML PO LQCR: 5.0000 mL | Freq: Two times a day (BID) | ORAL | Status: DC | PRN

## 2014-08-24 NOTE — Telephone Encounter (Signed)
Spoke with the pt  She states that she has thought about it, and wants to start process for pirfenidone  She also wanted Dr [name] to know that she bought some otc robitussin for her cough since she is allergic to hydrocodone (nasuea only) Nothing further needed per pt  Will forward to Dr [name] so he is aware

## 2014-08-24 NOTE — Progress Notes (Signed)
PFT performed today. 

## 2014-08-24 NOTE — Assessment & Plan Note (Signed)
Kaitlin Hamilton uses 3 L of oxygen at rest and 6-8 L with exercise. I have recommended that she and her family go online to research the various oxygen concentrators that can supply this need and then get back with me with a device that they are interested in. If possible, we will [facility] to [facility] to deliver this.

## 2014-08-24 NOTE — Assessment & Plan Note (Signed)
Today I reviewed the pulmonary function testing clinic with Kaitlin Hamilton and her family. We also reviewed the results of the CT chest which was performed earlier this week. Both of these have shown a significant decline in the last year and a half. There has been a slight decline in her pulmonary function tests since starting CellCept but during that time she required significantly more oxygen. Overall both Kaitlin Hamilton and her family feel that she has worsened despite being on CellCept and prednisone.  She needs to switch to anti-fibrotic therapy because she does not seem to be getting benefit from the prednisone and the CellCept. Today we discussed the risks and benefits of both Ofev and Esbriet. We also discussed the side effects.  Plan: -She and her family are going to consider whether or not they want to use Ofev or Esbriet, I discussed the risks and benefits of each -Once she starts using the anti-fibrotic therapy she will discontinue the CellCept, prednisone, and dapsone  Greater than 35 minutes were spent in clinic today in direct consultation with Kaitlin Hamilton and her family about this

## 2014-08-24 NOTE — Progress Notes (Signed)
Subjective:    Patient ID: Kaitlin Hamilton, female    DOB: September 16, 1950, 64 y.o.   MRN: [id number]  Synopsis: Kaitlin Hamilton is a very pleasant 64 year old female who first saw the [facility] pulmonary clinic in March 2014 for evaluation of shortness of breath. She had significant cough, wheezing, and sputum production requiring multiple rounds of antibiotics. Because of crackles on lung exam and an abnormal pulmonary function test she was referred to [place]. We ordered full pulmonary function testing which showed moderate restriction and a depressed DLCO in proportion to her restriction. There is no airflow obstruction and no change with bronchodilator administration. A chest x-ray performed in February 2014 was read as normal.  An open lung biopsy was performed in December 2014 showing UIP.  Because she has had multiple good responses to steroids, we have treated her with cellcept and prednisone.  In January 2015 she had severe insomnia, hallucinations and gastritis on high dose prednisone.  She had a rash to bactrim. Since February 2015 she has been on cellcept and low dose prednisone and dapsone.  HPI Chief Complaint  Patient presents with  . Follow-up    review pft and ct chest. pt c/o prod cough with green mucus X1 week.  Having runny nose tinged with blood, some wheezing at night.     Kaitlin Hamilton had a hard time around Christmas because the oxygen supplier did not give her a concentrator strong enough to keep her up with need.  They want to have another concentrator.  She feels like she is doing about the same and she doesn't see a bid difference.  She isn't doing laundry but she is still going to the grocery store but she struggles to get oxygen.  In the last week she has had more chest congestion with green mucus but no chest pain or fever.  She has taken 5 days of doxycycline as of today.  She has sinus congestion but she is not taking.     Past Medical History  Diagnosis Date  . Bronchitis   .  Chicken pox   . Depression   . Allergic rhinitis     uses Flonase daily  . Hyperlipidemia     lost 43 pounds and no meds required at present  . Hypokalemia   . Anxiety   . PONV (postoperative nausea and vomiting)   . Hypertension     takes Verapamil and HCTZ daily  . Shortness of breath     with exertion;takes Singulair daily as well as Flonase  . Pneumonia     last time in 2006  . History of bronchitis     2013  . H/O hiatal hernia   . Migraines     last one about a month ago  . Dysphagia   . Joint swelling     left thumb  . OA (osteoarthritis)     left knee  . GERD (gastroesophageal reflux disease)     takes Protonix daily  . Hemorrhoids   . History of colon polyps   . Diverticulosis   . Urinary frequency   . History of kidney stones   . Non-insulin dependent type 2 diabetes mellitus     takes Glipizide daily  . Constipation     takes Fiber daily  . Hypothyroidism (acquired)     takes Synthroid daily  . Insomnia     doesn't take any meds for this        Review of Systems  Constitutional: Negative  for fever, chills, activity change and fatigue.  HENT: Negative for congestion, ear pain, nosebleeds, postnasal drip, rhinorrhea and sinus pressure.   Respiratory: Positive for cough and shortness of breath. Negative for wheezing.   Cardiovascular: Negative for chest pain, palpitations and leg swelling.  Gastrointestinal: Negative for nausea and abdominal pain.       Objective:   Physical Exam  Filed Vitals:   08/24/14 0937  BP: 128/70  Pulse: 84  Height: '5\' 6"'  (1.676 m)  Weight: 257 lb (116.574 kg)  SpO2: 95%  3L Caroline   Gen:  no acute distress  HEENT: NCAT,  EOMi, OP clear,  PULM: Crackles 1/2 way up bilaterally CV: RRR, no mgr, no JVD AB: BS+, soft, nontender, no hsm Ext: warm, no edema, no clubbing Neuro: A&Ox4, maew  February 2014 simple spirometry performed by her primary care physician>> ratio 90%, FEV1 1.71 L (64% predicted, FVC 1.83 L 55%  predicted; flow volume loop is not consistent with obstruction February 2014 chest x-ray at [facility] normal 10/19/2012 walked 500 feet in office on room air oxygenation did not drop below 90% 11/15/2012 Full PFT [name]> Ratio 87%, FEV1 2.00L > 2.07 with bronchodilator (3% change); TLC 3.44 L (65% pred), ERV 0.62 (57% pred), DLCO 15.1 ml/mmHg/min (59% pred) 11/2012 CT chest >> ([facility] read) centrilobular nodules, interlobular septal thickening worse in bases and periphery R lung > L; some GGO in bases and periphery as well, some bronchiectasis in the bases R > L; findings suggestive of fibrosis but not UIP; question hypersensitivity pneumonitis given centrilobular nodules; also question aspiration 03/2013 ANA, ANCA, Anti-Jo-1, ESR, RF, SCL-70, anti-centromere, SSA/SSB, all negative; CRP 0.6;  04/2013 Barium swallow> abnormal esophageal motility, GERD, hiatal hernia 04/2013 Full PFT> Ratio 93%, FEV1 1.92 L (71% pred), TLC 3.02L (56% pred), DLCO 15.94 (59% pred) 04/2013 CT chest ([name])> findings suggestive of but not diagnostic of NSIP, small pulmonary nodule 04/2013 6MW RA > 1100 feet, HR peak 109, O2 sat Nadir 87% 04/2013 HP panel >> 05/17/2013 3 month prednisone trial 06/2013 open lung biopsy> UIP 07/28/13 Cell cept and bactrim started > bactrim stopped 07/29/13 due to rash  07/2013 hospitalized for confusion > prednisone 07/2013 hospitalized for gastritis/abdominal pain> prednisone 08/22/2013 atovaquone caused rash 11/2013 6MW 1364 feet, O2 saturation nadir 78% May 2015 full pulmonary function test ratio 93%, FEV1 1.71 L (64% predicted, no change with bronchodilator), total lung capacity 2.67 L (49% predicted), DLCO 13.11 (40% predicted) February 2016 CT chest high resolution> slight progression in reticular abnormalities consistent with pulmonary fibrosis, uip      Assessment & Plan:   UIP (usual interstitial pneumonitis) Today I reviewed the pulmonary function testing clinic with Kaitlin Hamilton and her  family. We also reviewed the results of the CT chest which was performed earlier this week. Both of these have shown a significant decline in the last year and a half. There has been a slight decline in her pulmonary function tests since starting CellCept but during that time she required significantly more oxygen. Overall both Kaitlin Hamilton and her family feel that she has worsened despite being on CellCept and prednisone.  She needs to switch to anti-fibrotic therapy because she does not seem to be getting benefit from the prednisone and the CellCept. Today we discussed the risks and benefits of both Ofev and Esbriet. We also discussed the side effects.  Plan: -She and her family are going to consider whether or not they want to use Ofev or Esbriet, I discussed the risks  and benefits of each -Once she starts using the anti-fibrotic therapy she will discontinue the CellCept, prednisone, and dapsone  Greater than 35 minutes were spent in clinic today in direct consultation with Kaitlin Hamilton and her family about this   Chronic hypoxemic respiratory failure Kaitlin Hamilton uses 3 L of oxygen at rest and 6-8 L with exercise. I have recommended that she and her family go online to research the various oxygen concentrators that can supply this need and then get back with me with a device that they are interested in. If possible, we will continue to use advanced home care to deliver this.   Acute bronchitis She is currently recovering from another episode of bronchitis. Her lung exam is unchanged today as are her vital signs I do not believe she has pneumonia. I have prescribed another bottle of Tussionex to use at night as needed for the cough.     Updated Medication List Outpatient Encounter Prescriptions as of 08/24/2014  Medication Sig  . acetaminophen (TYLENOL) 500 MG tablet Take 1,000 mg by mouth every 6 (six) hours as needed for moderate pain.  . Azelastine-Fluticasone (DYMISTA) 137-50 MCG/ACT SUSP One puff per  nostril twice daily  . benzonatate (TESSALON) 200 MG capsule Take 1 capsule (200 mg total) by mouth 3 (three) times daily as needed for cough.  . dapsone 100 MG tablet Take 1 tablet (100 mg total) by mouth daily.  . diphenhydrAMINE (BENADRYL) 25 mg capsule Take 25 mg by mouth as needed.  Marland Kitchen FIBER PO Take 1 tablet by mouth daily.  Marland Kitchen glipiZIDE (GLUCOTROL) 10 MG tablet Take 1 tablet (10 mg total) by mouth 2 (two) times daily before a meal.  . hydrocortisone cream 0.5 % Apply 1 application topically 2 (two) times daily as needed (for rash).  Marland Kitchen ibuprofen (ADVIL,MOTRIN) 200 MG tablet Take 200 mg by mouth as needed.  Marland Kitchen levothyroxine (SYNTHROID, LEVOTHROID) 125 MCG tablet Take 125 mcg by mouth daily before breakfast.  . losartan-hydrochlorothiazide (HYZAAR) 50-12.5 MG per tablet Take 1 tablet by mouth daily.  . montelukast (SINGULAIR) 10 MG tablet Take 10 mg by mouth at bedtime.  . mycophenolate (CELLCEPT) 500 MG tablet Take 3 tablets (1,500 mg total) by mouth 2 (two) times daily.  . NON FORMULARY Place 2 L into the nose daily. 3 Liters with exertion  . ONE TOUCH ULTRA TEST test strip   . pantoprazole (PROTONIX) 40 MG tablet Take 40 mg by mouth 2 (two) times daily.   . predniSONE (DELTASONE) 10 MG tablet Take 1 tablet (10 mg total) by mouth daily with breakfast.  . saxagliptin HCl (ONGLYZA) 5 MG TABS tablet Take 1 tablet (5 mg total) by mouth daily.  . verapamil (VERELAN PM) 180 MG 24 hr capsule Take 180 mg by mouth daily.  . chlorpheniramine-HYDROcodone (TUSSIONEX PENNKINETIC ER) 10-8 MG/5ML LQCR Take 5 mLs by mouth every 12 (twelve) hours as needed for cough.

## 2014-08-24 NOTE — Patient Instructions (Signed)
Oxygen concentrator: research oxygen concentrators that can provide up to 6L per minute of oxygen and let us know which one you think works best for you  Bronchitis: take the rest of the doxycycline through tomorrow, use the tussionex twice a day as needed for cough  I want you to consider whether or not you should take Pirfenidone (Esbriet) or Nintedamib (Ofev).  Pirfenidone is three times a day and Ofev is twice a day.  There are more clinical trials available for patients who take Pirfenidone.  Once you have made a decision, call us and we will start the paperwork. It will take weeks to get the medication.  Once you get the new medication, we will have you stop taking the cellcept over the course of one week.  Specifically we will have you decrease the cellcept by 500mg  daily over one week.    We will see you back in 6 weeks or sooner if needed

## 2014-08-24 NOTE — Telephone Encounter (Signed)
Caryl Pina, please start process for pirfenidone. Thanks, Ruby Cola

## 2014-08-24 NOTE — Assessment & Plan Note (Signed)
She is currently recovering from another episode of bronchitis. Her lung exam is unchanged today as are her vital signs I do not believe she has pneumonia. I have prescribed another bottle of Tussionex to use at night as needed for the cough.

## 2014-08-25 ENCOUNTER — Other Ambulatory Visit: Payer: Self-pay | Admitting: *Deleted

## 2014-08-25 MED ORDER — GLIPIZIDE 10 MG PO TABS: 10.0000 mg | ORAL_TABLET | Freq: Two times a day (BID) | ORAL | Status: DC

## 2014-08-28 ENCOUNTER — Other Ambulatory Visit: Payer: Self-pay | Admitting: *Deleted

## 2014-08-28 MED ORDER — DAPSONE 100 MG PO TABS: 100.0000 mg | ORAL_TABLET | Freq: Every day | ORAL | Status: DC

## 2014-08-28 NOTE — Telephone Encounter (Signed)
Spoke with pt, advised her not to change her cellcept rx until she hears otherwise from BQ.  Pt will come to [place] office on Wednesday afternoon to sign Esbriet paperwork.

## 2014-08-28 NOTE — Telephone Encounter (Signed)
Pt is calling back about her cellept  What should she do [phone]

## 2014-08-29 ENCOUNTER — Other Ambulatory Visit: Payer: Self-pay | Admitting: *Deleted

## 2014-08-29 MED ORDER — LOSARTAN POTASSIUM-HCTZ 50-12.5 MG PO TABS: 1.0000 | ORAL_TABLET | Freq: Every day | ORAL | Status: DC

## 2014-08-30 NOTE — Telephone Encounter (Signed)
The form for esbriet has been given to [facility] to give to patient this afternoon, with instructions to fill out patient and financial portion and return back to office when this is completed.

## 2014-09-01 ENCOUNTER — Encounter: Payer: Self-pay | Admitting: Pulmonary Disease

## 2014-09-01 NOTE — Telephone Encounter (Signed)
Form was given to pt on 08/30/14 while we were in the [place] office.

## 2014-09-08 ENCOUNTER — Telehealth: Payer: Self-pay

## 2014-09-08 NOTE — Telephone Encounter (Signed)
Request from [place] ([place] member) , sent to [place] on 09/11/2014 . For pt daughter Estate manager/land agent

## 2014-09-11 ENCOUNTER — Telehealth: Payer: Self-pay | Admitting: Family Medicine

## 2014-09-11 DIAGNOSIS — IMO0002 Reserved for concepts with insufficient information to code with codable children: Secondary | ICD-10-CM

## 2014-09-11 DIAGNOSIS — E1165 Type 2 diabetes mellitus with hyperglycemia: Secondary | ICD-10-CM

## 2014-09-11 NOTE — Telephone Encounter (Signed)
-----   Message from Ellamae Sia sent at 09/06/2014  5:35 PM EST ----- Regarding: Lab orders for Tuesday,2.23.16 Lab orders for a f/u appt

## 2014-09-12 ENCOUNTER — Other Ambulatory Visit (INDEPENDENT_AMBULATORY_CARE_PROVIDER_SITE_OTHER): Payer: 59

## 2014-09-12 DIAGNOSIS — E1165 Type 2 diabetes mellitus with hyperglycemia: Secondary | ICD-10-CM

## 2014-09-12 DIAGNOSIS — IMO0002 Reserved for concepts with insufficient information to code with codable children: Secondary | ICD-10-CM

## 2014-09-12 LAB — LIPID PANEL
Cholesterol: 249 mg/dL — ABNORMAL HIGH (ref 0–200)
HDL: 61.2 mg/dL (ref >39.00)
LDL CALC: 152 mg/dL — AB (ref 0–99)
NonHDL: 187.8
Total CHOL/HDL Ratio: 4
Triglycerides: 179 mg/dL — ABNORMAL HIGH (ref 0.0–149.0)
VLDL: 35.8 mg/dL (ref 0.0–40.0)

## 2014-09-12 LAB — HEMOGLOBIN A1C: HEMOGLOBIN A1C: 4.6 % (ref 4.6–6.5)

## 2014-09-14 ENCOUNTER — Telehealth: Payer: Self-pay | Admitting: Pulmonary Disease

## 2014-09-14 NOTE — Telephone Encounter (Signed)
Spoke with the pt  She is asking about coming off of cellcept  I advised that the plan is to wait until she gets started on Esbriet  She states that she has not heard anything from [place] about the status of Esbriet  She dropped off the forms to [place] on 08/30/14- same day she received them  Caryl Pina, can you please confirm the form was received and faxed or advise on status? Thanks!

## 2014-09-14 NOTE — Telephone Encounter (Signed)
Forms have been received and faxed- no response yet from esbriet, and I unfortunately have not had time to contact them to follow up.   Pt is aware that she is not to d/c cellcept until after she begins Esbriet.Marland Kitchen

## 2014-09-15 ENCOUNTER — Encounter: Payer: Self-pay | Admitting: Pulmonary Disease

## 2014-09-15 NOTE — Telephone Encounter (Signed)
Spoke with pt and advised her again not to change her medication schedule until her esbriet is approved.  Nothing further needed at this time.

## 2014-09-19 ENCOUNTER — Encounter: Payer: Self-pay | Admitting: Family Medicine

## 2014-09-19 ENCOUNTER — Ambulatory Visit (INDEPENDENT_AMBULATORY_CARE_PROVIDER_SITE_OTHER): Payer: 59 | Admitting: Family Medicine

## 2014-09-19 VITALS — BP 122/72 | HR 92 | Temp 97.5°F | Ht 66.0 in | Wt 261.0 lb

## 2014-09-19 DIAGNOSIS — B373 Candidiasis of vulva and vagina: Secondary | ICD-10-CM | POA: Insufficient documentation

## 2014-09-19 DIAGNOSIS — I10 Essential (primary) hypertension: Secondary | ICD-10-CM

## 2014-09-19 DIAGNOSIS — IMO0002 Reserved for concepts with insufficient information to code with codable children: Secondary | ICD-10-CM

## 2014-09-19 DIAGNOSIS — B3731 Acute candidiasis of vulva and vagina: Secondary | ICD-10-CM

## 2014-09-19 DIAGNOSIS — E7800 Pure hypercholesterolemia, unspecified: Secondary | ICD-10-CM

## 2014-09-19 DIAGNOSIS — E1165 Type 2 diabetes mellitus with hyperglycemia: Secondary | ICD-10-CM

## 2014-09-19 LAB — HM DIABETES FOOT EXAM

## 2014-09-19 MED ORDER — PANTOPRAZOLE SODIUM 40 MG PO TBEC: 40.0000 mg | DELAYED_RELEASE_TABLET | Freq: Two times a day (BID) | ORAL | Status: DC

## 2014-09-19 MED ORDER — FLUCONAZOLE 150 MG PO TABS: 150.0000 mg | ORAL_TABLET | Freq: Every day | ORAL | Status: DC

## 2014-09-19 NOTE — Progress Notes (Signed)
Pre visit review using our clinic review tool, if applicable. No additional management support is needed unless otherwise documented below in the visit note. 

## 2014-09-19 NOTE — Assessment & Plan Note (Signed)
Treat with diflucan x 1 .  This may also help any yeast infection at her lip not allowing lesion to heal.

## 2014-09-19 NOTE — Patient Instructions (Addendum)
Make sure you keep up with healthy eating and regular exercise.  Work on relaxation and stress reduction.  When you stop prednisone.. Check a blood sugar  fasting and 2 hours after a meal.  Call if any numbers less than 60. Change to low fat, 2 % cheese.4 Can try red yeast rice 1200 mg twice daily.  Take diflucan x 1 .

## 2014-09-19 NOTE — Assessment & Plan Note (Signed)
A1C at goal, but recent CBGs increased. Will be coming off prednisone soon.  Will keep on current regimen... Follow CBGs closely.  Follow up in 3 months.

## 2014-09-19 NOTE — Assessment & Plan Note (Signed)
Well controlled. Continue current medication.  

## 2014-09-19 NOTE — Assessment & Plan Note (Signed)
Poor control. Counseled on diet and lifestyle changes. Pt refuses more med, may consider red yeast but doubtful she will use.

## 2014-09-19 NOTE — Progress Notes (Signed)
64 year old female presents for 3 month follow up.  She is on daily prednisone. She will be coming off cellcept, prednisone and antibiotics in next few weeks. She is being enrolled in a experimental study for her UIP and new fibrosis.  Diabetes: Started onglyza in addition tomax glipizide. SE to metformin in past. Referred to [facility]. Started pulm rehab, but will be done soon... She will have to do exercise bike at home. Lab Results  Component Value Date   HGBA1C 4.6 09/12/2014  Using medications without difficulties: Hypoglycemic episodes:None Hyperglycemic episodes: Feet problems:None  FBS 137-209 in last few weeks increasing. She feeels stress is increasing blood sugar. She has been improving her diet tremendously. Decreased carbs and fried foods.  Wt Readings from Last 3 Encounters:  09/19/14 261 lb (118.389 kg)  08/24/14 257 lb (116.574 kg)  07/03/14 260 lb (117.935 kg)   Elevated Cholesterol:  Inadequate control. On no chol med. She does not want more meds. Never tried red yeast rice. Lab Results  Component Value Date   CHOL 249* 09/12/2014   HDL 61.20 09/12/2014   LDLCALC 152* 09/12/2014   TRIG 179.0* 09/12/2014   CHOLHDL 4 09/12/2014  Using medications without problems: None Muscle aches: None Diet compliance: Good Exercise: Limited but in rehab. Other complaints:  Hypertension:    Stable on losartan HCTZ. BP Readings from Last 3 Encounters:  09/19/14 122/72  08/24/14 128/70  07/03/14 110/60     Using medication without problems or lightheadedness:  Chest pain with exertion: Edema: Short of breath: Average home BPs: Other issues:   Potassium nml on recheck. She has been increasing potassium in diet.   Review of Systems  Constitutional: Negative for fever and fatigue.  HENT: Nonnosebleeds Negative for ear pain.  Eyes: Negative for pain.  Respiratory: Negative for chest tightness and shortness of breath.  Cardiovascular: Negative for chest pain,  palpitations and leg swelling.  Gastrointestinal: Negative for abdominal pain and blood in stool.  Genitourinary: Negative for dysuria and hematuria.  Skin: Positive for rash.   No more bruising.  She has a non healing  Crack in center of upper lips, no redness, but white on both sides. She is having some vaginal itching and discharge. Recent antibitoics.    Objective:   Physical Exam  Constitutional: Vital signs are normal. She appears well-developed and well-nourished. She is cooperative. Non-toxic appearance. She does not appear ill. No distress.  HENT:  Head: Normocephalic.  Right Ear: Hearing, tympanic membrane, external ear and ear canal normal. Tympanic membrane is not erythematous, not retracted and not bulging.  Left Ear: Hearing, tympanic membrane, external ear and ear canal normal. Tympanic membrane is not erythematous, not retracted and not bulging.  Nose: No mucosal edema or rhinorrhea. Right sinus exhibits no maxillary sinus tenderness and no frontal sinus tenderness. Left sinus exhibits no maxillary sinus tenderness and no frontal sinus tenderness.  Mouth/Throat: Uvula is midline, oropharynx is clear and moist and mucous membranes are normal.  Eyes: Conjunctivae, EOM and lids are normal. Pupils are equal, round, and reactive to light. Lids are everted and swept, no foreign bodies found.  Neck: Trachea normal and normal range of motion. Neck supple. Carotid bruit is not present. No thyroid mass and no thyromegaly present.  Cardiovascular: Normal rate, regular rhythm, S1 normal, S2 normal, normal heart sounds, intact distal pulses and normal pulses. Exam reveals no gallop and no friction rub.  No murmur heard. Pulmonary/Chest: Effort normal and breath sounds normal. No tachypnea. No  respiratory distress. She has no decreased breath sounds. She has no wheezes. She has no rhonchi. She has no rales.  Abdominal: Soft. Normal appearance and bowel sounds are normal. There  is no tenderness.  bruising on abdomen  Neurological: She is alert.  Skin: Skin is warm, dry and intact. No rash noted.  Psychiatric: Her speech is normal and behavior is normal. Judgment and thought content normal. Her mood appears not anxious. Cognition and memory are normal. She does not exhibit a depressed mood.     Diabetic foot exam: Normal inspection No skin breakdown No calluses  Normal DP pulses Normal sensation to light touch and monofilament Nails normal

## 2014-09-20 ENCOUNTER — Telehealth: Payer: Self-pay | Admitting: Pulmonary Disease

## 2014-09-20 NOTE — Telephone Encounter (Signed)
Spoke with pt's daughter Donella Stade- states that her Esbriet 30 day supply will be fedex'ed on Friday.  Pt is needing recs on how to taper down on Cellcept and Prednisone.    Also, checking on fmla.  I gave [name] the number to [place] at [facility].    Dr [name] please advise on how to proceed with pt's tapering of prednisone and cellcept.  Thanks!

## 2014-09-20 NOTE — Telephone Encounter (Signed)
atc pt's daughter [name], no vm set up.  Called [employer] in [place], states that pt's FMLA paperwork was mailed back to pt on 2/29 due to no payment being received.

## 2014-09-20 NOTE — Telephone Encounter (Signed)
Maintain prednisone at dose. We will proceed with caution and titrate down the cellcept slowly. Tell them I would like to have her start it per usual instructions and see me while on all three meds meds. Then we will taper down

## 2014-09-20 NOTE — Telephone Encounter (Signed)
atc pt's daughter crystal to make her aware of BQ's recs and to schedule a rov with pt (PT NEEDS 30 MINUTE APPT SLOT) next available in [place] or [place].

## 2014-09-20 NOTE — Telephone Encounter (Signed)
Pt daughter returning call.Stanley A Dalton ° °

## 2014-09-21 NOTE — Telephone Encounter (Signed)
ATC pt's daughter.  Unable to leave voicemail.  Will try back.

## 2014-09-22 NOTE — Telephone Encounter (Signed)
Attempted to call [name]. No answer, no option to leave message.

## 2014-09-25 NOTE — Telephone Encounter (Signed)
Spoke with patient, given rec's per BQ Pt scheduled to see BQ 10/06/14 @ 9:30 Please advise BQ if this appt is okay or too far out? Thanks.

## 2014-09-25 NOTE — Telephone Encounter (Signed)
That's fine

## 2014-09-26 ENCOUNTER — Telehealth: Payer: Self-pay | Admitting: Pulmonary Disease

## 2014-09-26 NOTE — Telephone Encounter (Signed)
Received PA form for Esbriet from [facility]  Form placed in Dr. Anastasia Pall lookat to be completed and signed  Will forward to [facility] to f/u on

## 2014-09-28 NOTE — Telephone Encounter (Signed)
This has been faxed back to [facility]

## 2014-09-28 NOTE — Telephone Encounter (Signed)
Spoke with Kaitlin Hamilton at [employer].  Clarified that pt has had a lung biopsy in 06/2013 and that pt will not be taking Esbriet and Ofev together.  He states he should have an answer on Esbriet later today.  Will await response.

## 2014-09-29 NOTE — Telephone Encounter (Signed)
Approval letter received for the esbriet.  Coverage good through 09/28/2015.  Letter has been placed in BQ look at.  Nothing further is needed.

## 2014-10-02 ENCOUNTER — Other Ambulatory Visit: Payer: Self-pay | Admitting: *Deleted

## 2014-10-02 ENCOUNTER — Telehealth: Payer: Self-pay

## 2014-10-02 MED ORDER — DAPSONE 100 MG PO TABS: 100.0000 mg | ORAL_TABLET | Freq: Every day | ORAL | Status: DC

## 2014-10-02 NOTE — Telephone Encounter (Signed)
Pt left v/m; pt was seen 09/19/2014; crack on upper lip with white spots is no better; pt wants to know if there is another med could be given. [employer]. Pt request cb.

## 2014-10-03 IMAGING — CR DG ABDOMEN 1V
2 series · 2 of 2 positions shown · non-contrast
Comparison: CT 08/08/2013

CLINICAL DATA: Abdominal pain. Small bowel follow-through was
requested.

EXAM:
ABDOMEN - 1 VIEW

[t abdomen supine (1 of 2)]
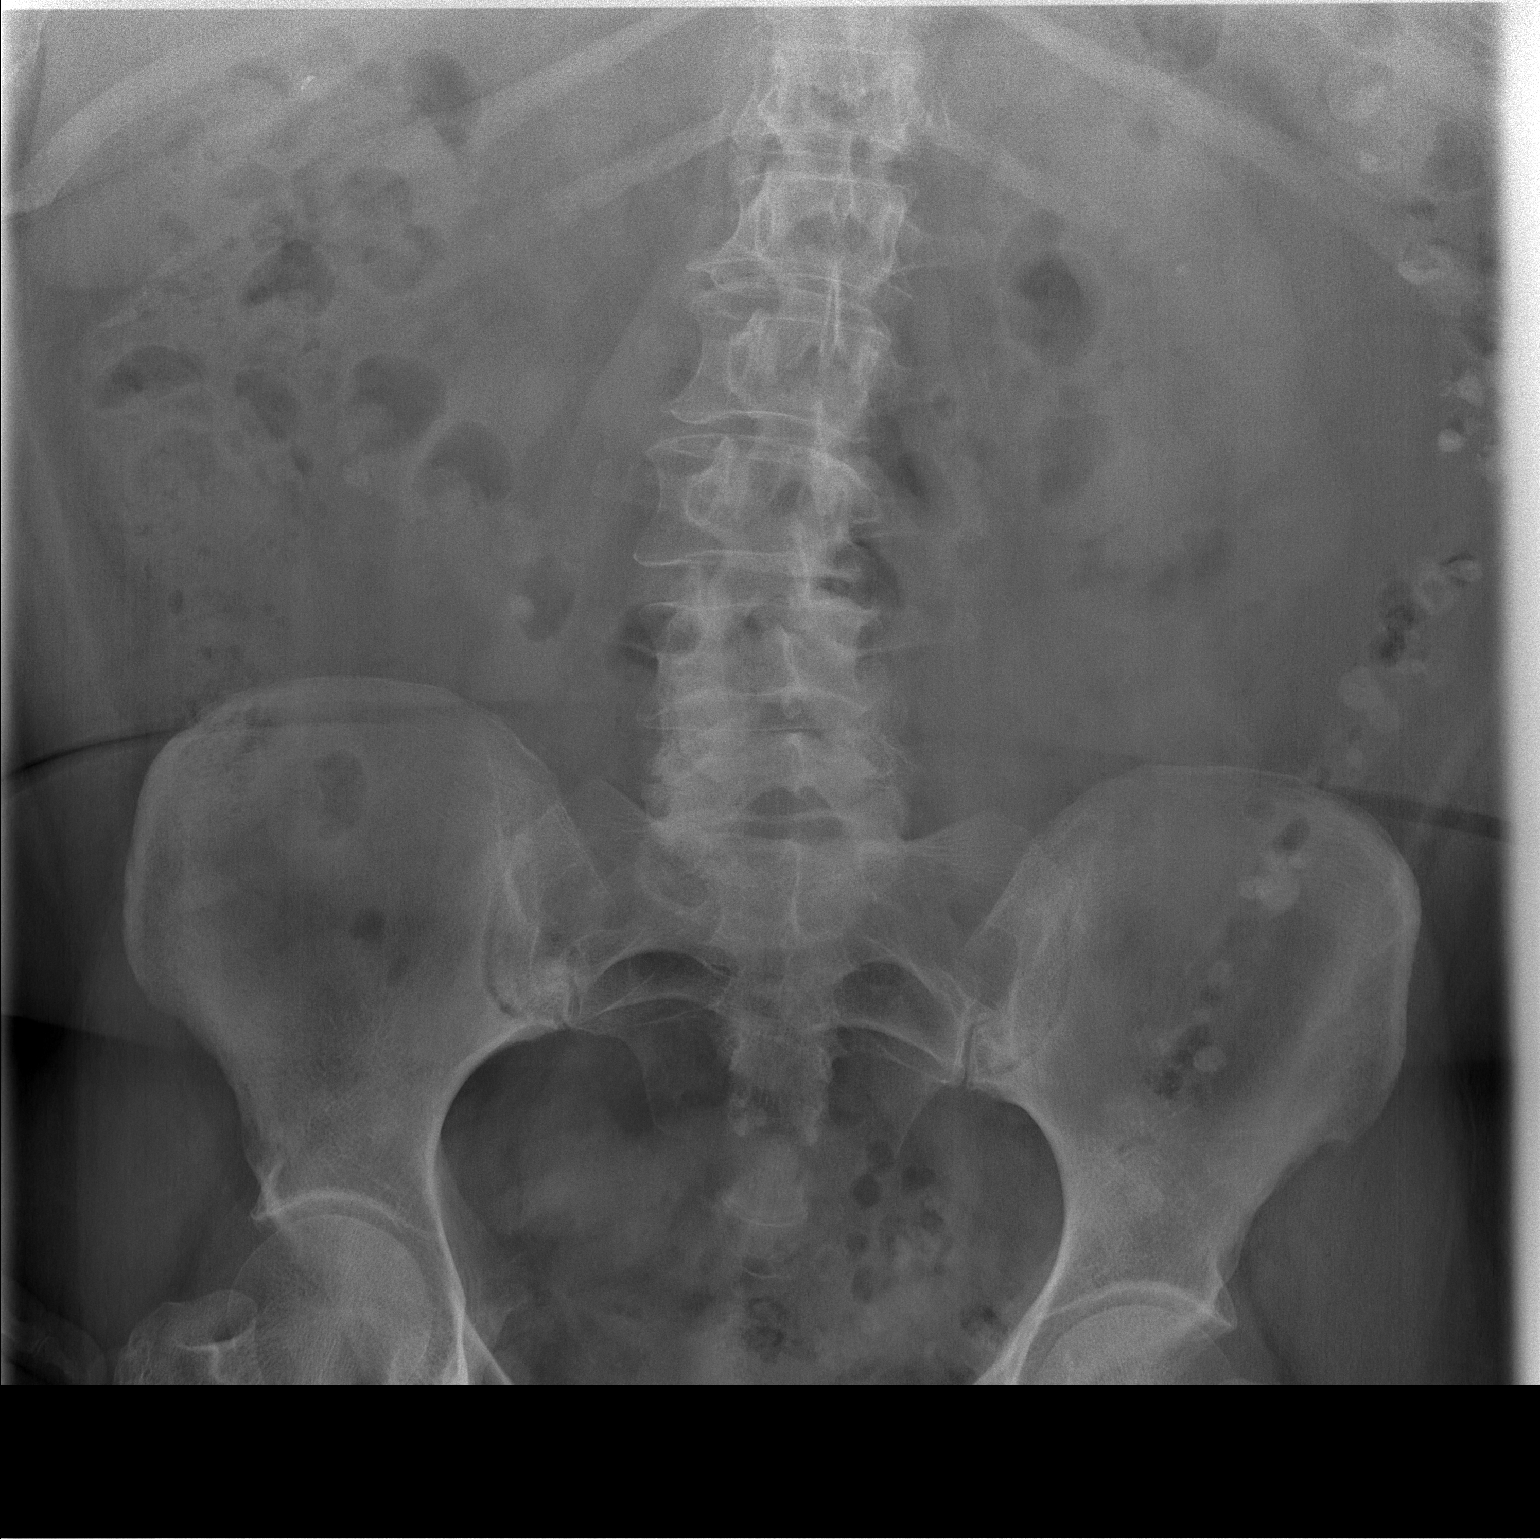

[t abdomen supine (2 of 2)]
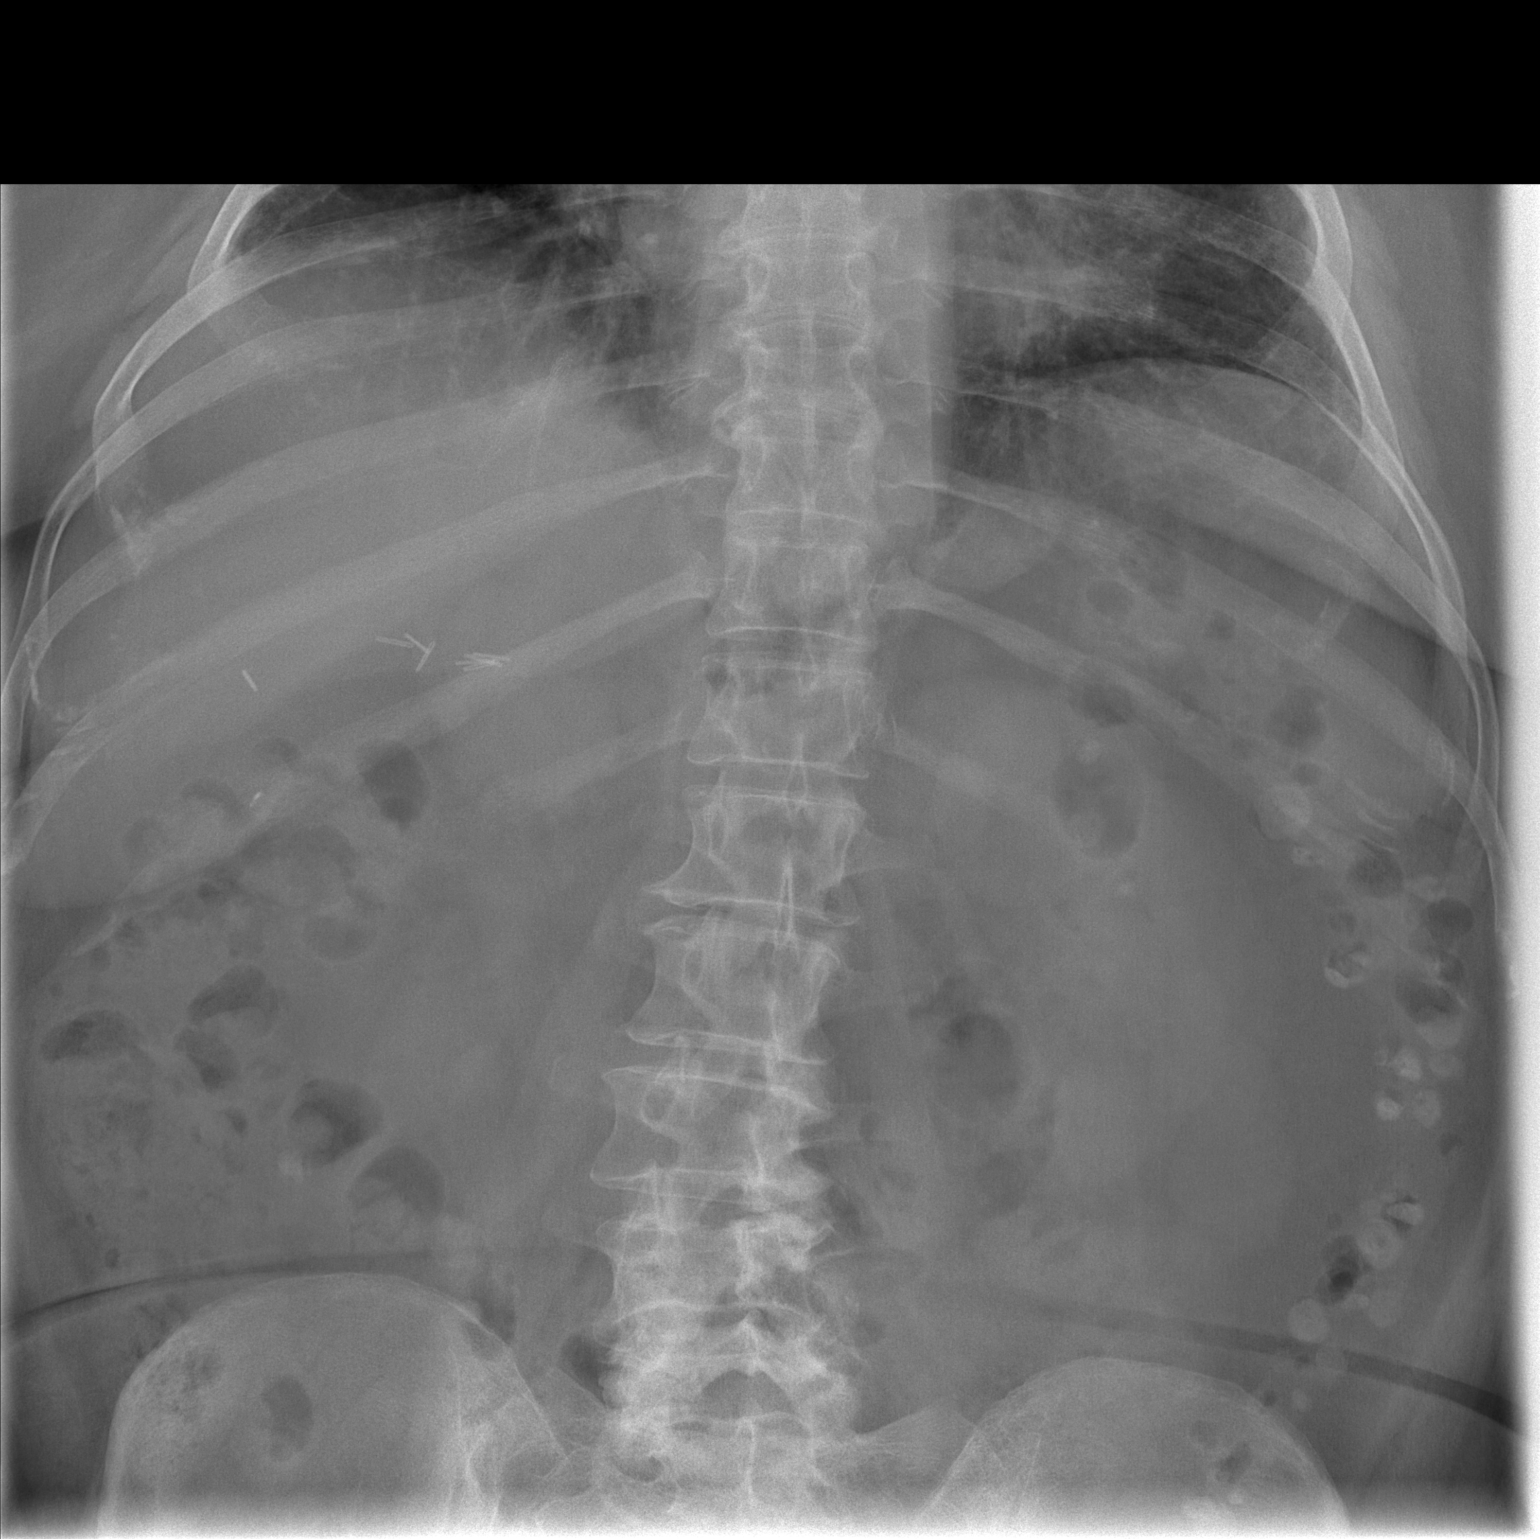

[2 of 2 positions shown; findings below may reference images not displayed]

FINDINGS: The bowel gas pattern is normal. Residual contrast is noted within
the colon which precludes performance of the requested exam today.
Numerous colonic diverticuli are noted. 3 mm left upper renal pole
calculus reidentified. Dextrorotatory scoliosis of the lumbar spine
is noted centered at L3. Right upper quadrant clips are
reidentified.
IMPRESSION: Normal bowel gas pattern. Residual contrast in the colon precludes
performance of small-bowel follow-through today.

## 2014-10-03 IMAGING — CR DG CHEST 2V
2 series · 2 of 2 positions shown · non-contrast
Comparison: 08/08/2013; 07/12/2013; 06/28/2013; high-resolution
chest CT - 04/22/2013

CLINICAL DATA: Left-sided chest pain, evaluate UIP

EXAM:
CHEST  2 VIEW

[w chest pa]
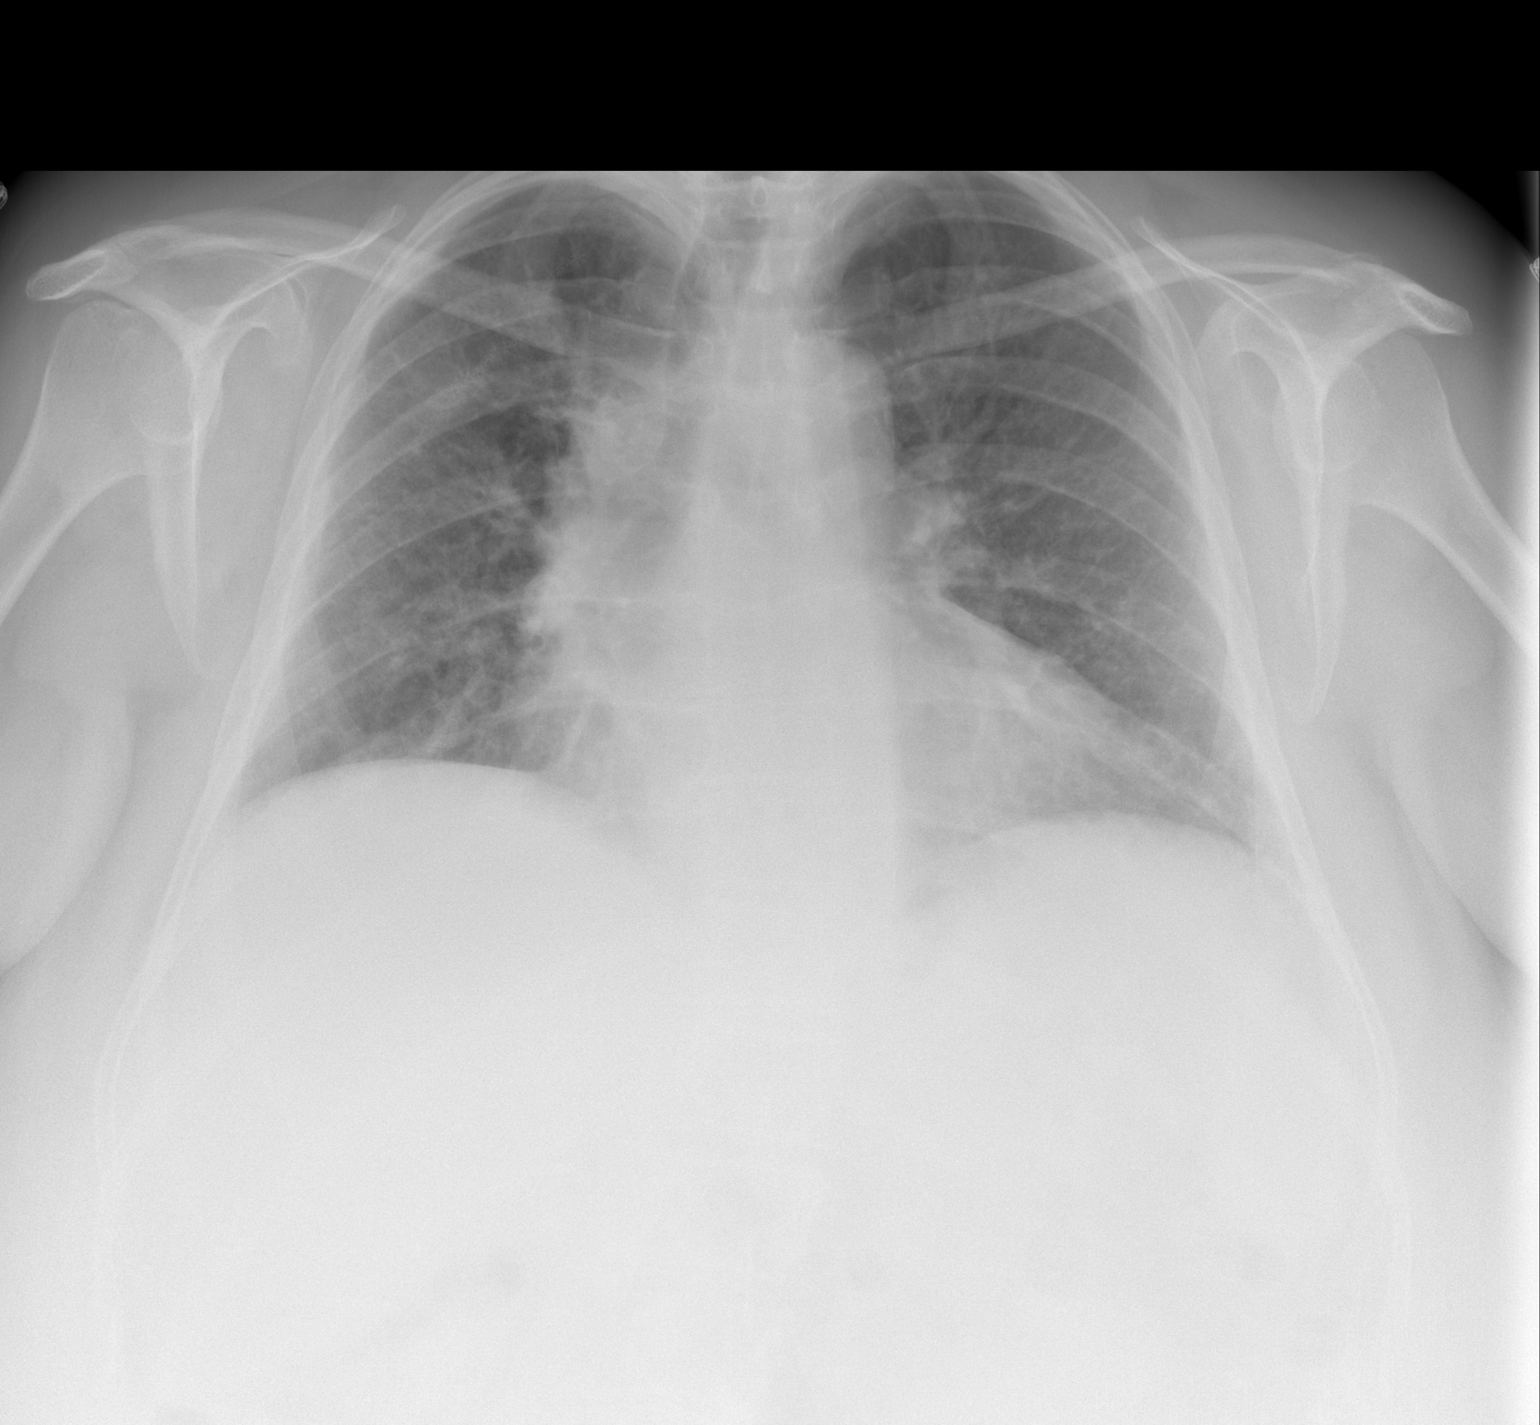

[w chest lat]
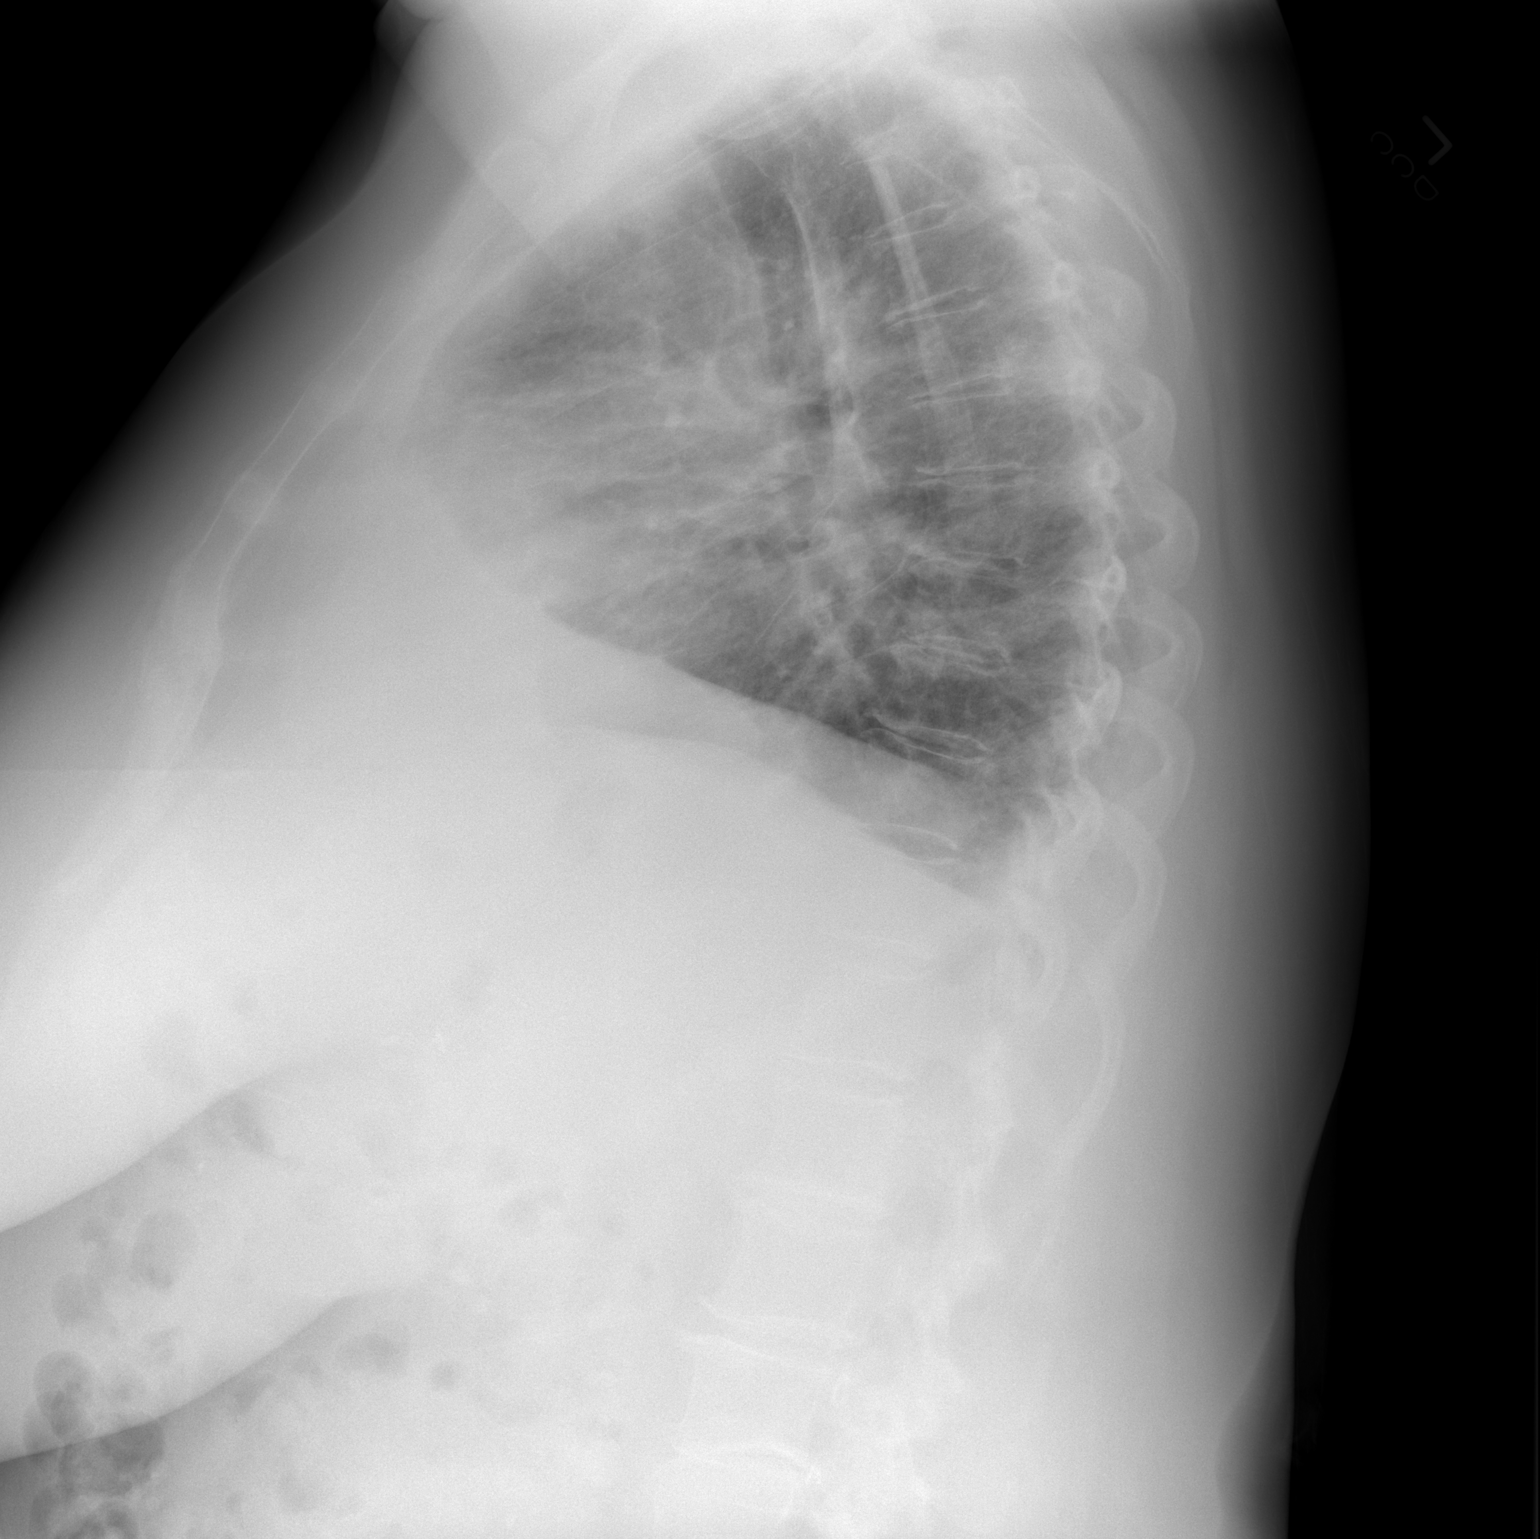

[2 of 2 positions shown; findings below may reference images not displayed]

FINDINGS: Grossly unchanged cardiac silhouette and mediastinal contours given
persistently reduced lung volumes. Stable postsurgical change the
right upper lobe. Grossly unchanged slightly nodular opacities about
the bilateral hilum are grossly unchanged. No new focal airspace
opacities. No definite pleural effusion or pneumothorax. No evidence
of edema. Grossly unchanged bones.
IMPRESSION: 1. Persistent findings of hypoventilation and perihilar atelectasis
without acute cardiopulmonary disease. Specifically, no definitive
evidence of progression of provided history of interstitial lung
disease.
2. Stable postsurgical change of the right upper lobe.

## 2014-10-03 MED ORDER — NYSTATIN 100000 UNIT/GM EX CREA: 1.0000 "application " | TOPICAL_CREAM | Freq: Two times a day (BID) | CUTANEOUS | Status: DC

## 2014-10-03 NOTE — Telephone Encounter (Signed)
Will send in nystatin cream to use on crack.. Will take a long time to heal.

## 2014-10-04 NOTE — Telephone Encounter (Signed)
Verify meds and doses she is taking.Marland Kitchen Glucotrol and onglyza? Given increase  In CBGs and continuing on prednisone is she open to adjusting meds?

## 2014-10-04 NOTE — Telephone Encounter (Signed)
Kaitlin Hamilton notified as instructed by telephone.  While on the phone she wanted to let Dr. [name] know that she started a new lung medication called Espriet about two weeks ago.  Dr. [name] is also keeping her on Cellcept, an Antibiotic and Prednisone.  She follows up with him on Friday.  Her Blood Sugars are running a little high probably due to the lung medications she is currently taking.  Her FBS reading have been:  10/01/2014-156 mg/dl; 10/02/2014- 211 mg/dl; 10/03/2014-212 mg/dl and 10/04/2014-230 mg/dl.  Advised I would send note to Dr. [name] to review.

## 2014-10-05 NOTE — Telephone Encounter (Signed)
She is taking 5mg  onglyza daily, 10mg  bid glipizide.  Patient is also taking prednisone.  She thinks she may be getting tapered off some medications tomorrow.  Patient is open to suggestions.

## 2014-10-06 ENCOUNTER — Telehealth: Payer: Self-pay | Admitting: Pulmonary Disease

## 2014-10-06 ENCOUNTER — Ambulatory Visit (INDEPENDENT_AMBULATORY_CARE_PROVIDER_SITE_OTHER): Payer: 59 | Admitting: Pulmonary Disease

## 2014-10-06 ENCOUNTER — Other Ambulatory Visit (INDEPENDENT_AMBULATORY_CARE_PROVIDER_SITE_OTHER): Payer: 59

## 2014-10-06 ENCOUNTER — Encounter: Payer: Self-pay | Admitting: Pulmonary Disease

## 2014-10-06 ENCOUNTER — Encounter: Payer: Self-pay | Admitting: Family Medicine

## 2014-10-06 VITALS — BP 142/68 | HR 81 | Ht 66.0 in | Wt 263.0 lb

## 2014-10-06 DIAGNOSIS — G4733 Obstructive sleep apnea (adult) (pediatric): Secondary | ICD-10-CM | POA: Diagnosis not present

## 2014-10-06 DIAGNOSIS — R0789 Other chest pain: Secondary | ICD-10-CM

## 2014-10-06 DIAGNOSIS — J84112 Idiopathic pulmonary fibrosis: Secondary | ICD-10-CM | POA: Diagnosis not present

## 2014-10-06 DIAGNOSIS — J9611 Chronic respiratory failure with hypoxia: Secondary | ICD-10-CM

## 2014-10-06 DIAGNOSIS — J309 Allergic rhinitis, unspecified: Secondary | ICD-10-CM | POA: Insufficient documentation

## 2014-10-06 DIAGNOSIS — Z23 Encounter for immunization: Secondary | ICD-10-CM | POA: Diagnosis not present

## 2014-10-06 DIAGNOSIS — J3089 Other allergic rhinitis: Secondary | ICD-10-CM

## 2014-10-06 DIAGNOSIS — R21 Rash and other nonspecific skin eruption: Secondary | ICD-10-CM

## 2014-10-06 LAB — CBC WITH DIFFERENTIAL/PLATELET
Basophils Absolute: 0.1 10*3/uL (ref 0.0–0.1)
Basophils Relative: 0.4 % (ref 0.0–3.0)
EOS ABS: 0.1 10*3/uL (ref 0.0–0.7)
Eosinophils Relative: 0.9 % (ref 0.0–5.0)
HCT: 33.9 % — ABNORMAL LOW (ref 36.0–46.0)
HEMOGLOBIN: 10.9 g/dL — AB (ref 12.0–15.0)
Lymphocytes Relative: 14 % (ref 12.0–46.0)
Lymphs Abs: 1.7 10*3/uL (ref 0.7–4.0)
MCHC: 32.3 g/dL (ref 30.0–36.0)
MCV: 96.8 fl (ref 78.0–100.0)
Monocytes Absolute: 0.6 10*3/uL (ref 0.1–1.0)
Monocytes Relative: 5 % (ref 3.0–12.0)
NEUTROS ABS: 9.6 10*3/uL — AB (ref 1.4–7.7)
Neutrophils Relative %: 79.7 % — ABNORMAL HIGH (ref 43.0–77.0)
Platelets: 316 10*3/uL (ref 150.0–400.0)
RBC: 3.5 Mil/uL — ABNORMAL LOW (ref 3.87–5.11)
RDW: 16.7 % — AB (ref 11.5–15.5)
WBC: 12.1 10*3/uL — ABNORMAL HIGH (ref 4.0–10.5)

## 2014-10-06 LAB — BASIC METABOLIC PANEL
BUN: 14 mg/dL (ref 6–23)
CO2: 31 mEq/L (ref 19–32)
Calcium: 9.4 mg/dL (ref 8.4–10.5)
Chloride: 98 mEq/L (ref 96–112)
Creatinine, Ser: 0.76 mg/dL (ref 0.40–1.20)
GFR: 81.47 mL/min (ref >60.00)
Glucose, Bld: 249 mg/dL — ABNORMAL HIGH (ref 70–99)
POTASSIUM: 4 meq/L (ref 3.5–5.1)
Sodium: 135 mEq/L (ref 135–145)

## 2014-10-06 LAB — HEPATIC FUNCTION PANEL
ALT: 17 U/L (ref 0–35)
AST: 13 U/L (ref 0–37)
Albumin: 4.4 g/dL (ref 3.5–5.2)
Alkaline Phosphatase: 70 U/L (ref 39–117)
BILIRUBIN TOTAL: 0.8 mg/dL (ref 0.2–1.2)
Bilirubin, Direct: 0.1 mg/dL (ref 0.0–0.3)
Total Protein: 6.8 g/dL (ref 6.0–8.3)

## 2014-10-06 MED ORDER — LIDOCAINE 5 % EX PTCH: 1.0000 | MEDICATED_PATCH | Freq: Every day | CUTANEOUS | Status: DC | PRN

## 2014-10-06 NOTE — Telephone Encounter (Signed)
Pt at pharmacy now, requesting a Rx for Lidoderm patches 5% States this was supposed to have been called into pharmacy after visit.  Spoke with Dr [name], was unaware that this had to be rx'd and not OTC.  This has been called into pharmacy per BQ. Nothing further needed.

## 2014-10-06 NOTE — Assessment & Plan Note (Signed)
Continue CPAP.  

## 2014-10-06 NOTE — Telephone Encounter (Signed)
Patient notified as instructed by telephone and verbalized understanding. Patient stated that she has an appointment with Pulm this morning and will call back today with an update. Pharmacy-[facility].

## 2014-10-06 NOTE — Patient Instructions (Signed)
For the sinuses: Keep taking the Astelin as you are doing Keep taking sinuglair as you are doing Take generic zyrtec daily (cetirizine)  For the pulmonary fibrosis: Take the Esbriet as directed Cellcept (mycophenolate): decrease your daily dose by 500mg  a week until you are taking 500mg  daily, then stay on 500mg  daily. So tomorrow, I want you to take 1000mg  in the morning and 1000mg  at night, do that for a week, then in one week take 1000mg  in the morning and 500mg  at night.  Do that for a week.  Then take 500mg  in the morning and 500mg  at night.  Do that for a week, then decrease to 500mg  daily and stay on that dose continuously.  Prednisone: take 5mg  daily tomorrow, take that for a week.  Then take 5mg  every other day for a week then stop  Dapsone, stop taking this in two weeks.  Use a lidoderm patch for the pain  We will see you back in 2 months

## 2014-10-06 NOTE — Progress Notes (Signed)
Subjective:    Patient ID: Kaitlin Hamilton, female    DOB: 1950/09/13, 64 y.o.   MRN: [id number]  Synopsis: Kaitlin Hamilton is a very pleasant 64 year old female who first saw the [facility] pulmonary clinic in March 2014 for evaluation of shortness of breath. She had significant cough, wheezing, and sputum production requiring multiple rounds of antibiotics. Because of crackles on lung exam and an abnormal pulmonary function test she was referred to [place]. We ordered full pulmonary function testing which showed moderate restriction and a depressed DLCO in proportion to her restriction. There is no airflow obstruction and no change with bronchodilator administration. A chest x-ray performed in February 2014 was read as normal.  An open lung biopsy was performed in December 2014 showing UIP.  Because she has had multiple good responses to steroids, we have treated her with cellcept and prednisone.  In January 2015 she had severe insomnia, hallucinations and gastritis on high dose prednisone.  She had a rash to bactrim. Since February 2015 she has been on cellcept and low dose prednisone and dapsone.  HPI Chief Complaint  Patient presents with  . Follow-up    review meds now that she has been approved for Esbriet.  Pt c/o headaches, red spots on arm 2 days after starting the esbriet.  R sided back/side pain X1 month.    Kaitlin Hamilton started taking the Esbriet last week and she has noticed a headache and a rash.  She has not been out in the sun much.  She has been active with activities of daily living, but she has completed pulmonary rehab.  She is nervous about joining a gym because of the immunosuppressant medications. She is no longer coughing up green stuff. She notes soreness in her right chest that feels like a nagging sharp pain that comes and goes.  It is only localized to the right side.  She has sinus congestion and a dry cough.    Past Medical History  Diagnosis Date  . Bronchitis   . Chicken pox   .  Depression   . Allergic rhinitis     uses Flonase daily  . Hyperlipidemia     lost 43 pounds and no meds required at present  . Hypokalemia   . Anxiety   . PONV (postoperative nausea and vomiting)   . Hypertension     takes Verapamil and HCTZ daily  . Shortness of breath     with exertion;takes Singulair daily as well as Flonase  . Pneumonia     last time in 2006  . History of bronchitis     2013  . H/O hiatal hernia   . Migraines     last one about a month ago  . Dysphagia   . Joint swelling     left thumb  . OA (osteoarthritis)     left knee  . GERD (gastroesophageal reflux disease)     takes Protonix daily  . Hemorrhoids   . History of colon polyps   . Diverticulosis   . Urinary frequency   . History of kidney stones   . Non-insulin dependent type 2 diabetes mellitus     takes Glipizide daily  . Constipation     takes Fiber daily  . Hypothyroidism (acquired)     takes Synthroid daily  . Insomnia     doesn't take any meds for this        Review of Systems  Constitutional: Negative for fever, chills, activity change and fatigue.  HENT: Positive for congestion. Negative for ear pain, nosebleeds, postnasal drip, rhinorrhea and sinus pressure.   Respiratory: Positive for cough and shortness of breath. Negative for wheezing.   Cardiovascular: Positive for chest pain. Negative for palpitations and leg swelling.  Gastrointestinal: Negative for nausea and abdominal pain.       Objective:   Physical Exam  Filed Vitals:   10/06/14 0916  BP: 142/68  Pulse: 81  Height: '5\' 6"'  (1.676 m)  Weight: 263 lb (119.296 kg)  SpO2: 93%  3L [place]   Gen:  no acute distress  HEENT: NCAT,  EOMi, OP clear,  PULM: Crackles 2/3 way up bilaterally CV: RRR, no mgr, no JVD AB: BS+, soft, nontender, no hsm Ext: warm, no edema, no clubbing Neuro: A&Ox4, maew Derm: two small (<2cm areas of  Bruising over forearms bilaterally). There is no rash over the right chest, no fluctuance.   There is tenderness to light palpation over the skin in a dermatoma distribution over the surgical scar on the right chest  February 2014 simple spirometry performed by her primary care physician>> ratio 90%, FEV1 1.71 L (64% predicted, FVC 1.83 L 55% predicted; flow volume loop is not consistent with obstruction February 2014 chest x-ray at [facility] normal 10/19/2012 walked 500 feet in office on room air oxygenation did not drop below 90% 11/15/2012 Full PFT [name]> Ratio 87%, FEV1 2.00L > 2.07 with bronchodilator (3% change); TLC 3.44 L (65% pred), ERV 0.62 (57% pred), DLCO 15.1 ml/mmHg/min (59% pred) 11/2012 CT chest >> (Kaitlin Hamilton read) centrilobular nodules, interlobular septal thickening worse in bases and periphery R lung > L; some GGO in bases and periphery as well, some bronchiectasis in the bases R > L; findings suggestive of fibrosis but not UIP; question hypersensitivity pneumonitis given centrilobular nodules; also question aspiration 03/2013 ANA, ANCA, Anti-Jo-1, ESR, RF, SCL-70, anti-centromere, SSA/SSB, all negative; CRP 0.6;  04/2013 Barium swallow> abnormal esophageal motility, GERD, hiatal hernia 04/2013 Full PFT> Ratio 93%, FEV1 1.92 L (71% pred), TLC 3.02L (56% pred), DLCO 15.94 (59% pred) 04/2013 CT chest (Kaitlin Hamilton)> findings suggestive of but not diagnostic of NSIP, small pulmonary nodule 04/2013 6MW RA > 1100 feet, HR peak 109, O2 sat Nadir 87% 04/2013 HP panel >> 05/17/2013 3 month prednisone trial 06/2013 open lung biopsy> UIP 07/28/13 Cell cept and bactrim started > bactrim stopped 07/29/13 due to rash  07/2013 hospitalized for confusion > prednisone 07/2013 hospitalized for gastritis/abdominal pain> prednisone 08/22/2013 atovaquone caused rash 11/2013 6MW 1364 feet, O2 saturation nadir 78% May 2015 full pulmonary function test ratio 93%, FEV1 1.71 L (64% predicted, no change with bronchodilator), total lung capacity 2.67 L (49% predicted), DLCO 13.11 (40% predicted) February 2016 CT  chest high resolution> slight progression in reticular abnormalities consistent with pulmonary fibrosis, uip      Assessment & Plan:   UIP (usual interstitial pneumonitis) We will continue to titrate up the Esbriet, but because of her response to immunosuppressants I want her to stay on low dose cellcept as long as possible.  I'm a bit concerned about the very mild rash she had on her arms today, so we will need to check her cbc and lfts to ensure this is not related to a systemic side effect from the [place].  She was given the following instructions: Take the Esbriet as directed Cellcept (mycophenolate): decrease your daily dose by 514m a week until you are taking 5041mdaily, then stay on 50069maily. So tomorrow, I want you to take 1000m61m  in the morning and 1054m at night, do that for a week, then in one week take 10029min the morning and 50065mt night.  Do that for a week.  Then take 500m67m the morning and 500mg74mnight.  Do that for a week, then decrease to 500mg 79my and stay on that dose continuously.  Prednisone: take 5mg da9m tomorrow, take that for a week.  Then take 5mg eve23mother day for a week then stop  Dapsone, stop taking this in two weeks.  We will see you back in 2 months   OSA (obstructive sleep apnea) Continue CPAP   Chronic hypoxemic respiratory failure Continue O2 as written She was strongly encouraged to continue regular exercise with the graduate program at [employer]   [facility] She had a very mild area of bruising (less than 2cm diameter) on both forearms. Will check cbc to ensure platelets OK   Chest pain She has neuropathic pain in a dermatomal distribution over the area from her biopsy.    Plan: lidoderm patch   Allergic rhinitis For the sinuses: Keep taking the Astelin as you are doing Keep taking sinuglair as you are doing Take generic zyrtec daily (cetirizine)     > 25 minutes spend in consultation  today  Updated Medication List Outpatient Encounter Prescriptions as of 10/06/2014  Medication Sig  . acetaminophen (TYLENOL) 500 MG tablet Take 1,000 mg by mouth every 6 (six) hours as needed for moderate pain.  . Azelastine-Fluticasone (DYMISTA) 137-50 MCG/ACT SUSP One puff per nostril twice daily  . dapsone 100 MG tablet Take 1 tablet (100 mg total) by mouth daily.  . diphenhydrAMINE (BENADRYL) 25 mg capsule Take 25 mg by mouth as needed.  . FIBER Marland KitchenO Take 1 tablet by mouth daily.  . fluconazole (DIFLUCAN) 150 MG tablet Take 1 tablet (150 mg total) by mouth daily.  . glipiZMarland KitchenDE (GLUCOTROL) 10 MG tablet Take 1 tablet (10 mg total) by mouth 2 (two) times daily before a meal.  . hydrocortisone cream 0.5 % Apply 1 application topically 2 (two) times daily as needed (for rash).  . ibuproMarland Kitchenen (ADVIL,MOTRIN) 200 MG tablet Take 200 mg by mouth as needed.  . levothMarland Kitchenroxine (SYNTHROID, LEVOTHROID) 125 MCG tablet Take 125 mcg by mouth daily before breakfast.  . losartan-hydrochlorothiazide (HYZAAR) 50-12.5 MG per tablet Take 1 tablet by mouth daily.  . montelukast (SINGULAIR) 10 MG tablet Take 10 mg by mouth at bedtime.  . mycophenolate (CELLCEPT) 500 MG tablet Take 3 tablets (1,500 mg total) by mouth 2 (two) times daily.  . NON FORMULARY Place 2 L into the nose daily. 3 Liters with exertion  . nystatin cream (MYCOSTATIN) Apply 1 application topically 2 (two) times daily.  . ONE TOUCH ULTRA TEST test strip   . pantoprazole (PROTONIX) 40 MG tablet Take 1 tablet (40 mg total) by mouth 2 (two) times daily.  . Pirfenidone 267 MG CAPS Take 2 tablets by mouth 3 (three) times daily.  . predniSONE (DELTASONE) 10 MG tablet Take 1 tablet (10 mg total) by mouth daily with breakfast.  . saxagliptin HCl (ONGLYZA) 5 MG TABS tablet Take 1 tablet (5 mg total) by mouth daily.  . verapamil (VERELAN PM) 180 MG 24 hr capsule Take 180 mg by mouth daily.

## 2014-10-06 NOTE — Assessment & Plan Note (Signed)
For the sinuses: Keep taking the Astelin as you are doing Keep taking sinuglair as you are doing Take generic zyrtec daily (cetirizine)

## 2014-10-06 NOTE — Assessment & Plan Note (Signed)
She has neuropathic pain in a dermatomal distribution over the area from her biopsy.    Plan: lidoderm patch

## 2014-10-06 NOTE — Assessment & Plan Note (Signed)
Continue O2 as written She was strongly encouraged to continue regular exercise with the graduate program at [employer]

## 2014-10-06 NOTE — Telephone Encounter (Signed)
Call pt: We will need to add a medicaiton if she is remaining on prednisone. She in on max onglyza and max glipizide.  Have her call to let us know TODAY if pulm is tapering her off pred. If he is not not we will plan on adding a med, likely invokana

## 2014-10-06 NOTE — Assessment & Plan Note (Signed)
We will continue to titrate up the Esbriet, but because of her response to immunosuppressants I want her to stay on low dose cellcept as long as possible.  I'm a bit concerned about the very mild rash she had on her arms today, so we will need to check her cbc and lfts to ensure this is not related to a systemic side effect from the [place].  She was given the following instructions: Take the Esbriet as directed Cellcept (mycophenolate): decrease your daily dose by 500mg  a week until you are taking 500mg  daily, then stay on 500mg  daily. So tomorrow, I want you to take 1000mg  in the morning and 1000mg  at night, do that for a week, then in one week take 1000mg  in the morning and 500mg  at night.  Do that for a week.  Then take 500mg  in the morning and 500mg  at night.  Do that for a week, then decrease to 500mg  daily and stay on that dose continuously.  Prednisone: take 5mg  daily tomorrow, take that for a week.  Then take 5mg  every other day for a week then stop  Dapsone, stop taking this in two weeks.  We will see you back in 2 months

## 2014-10-06 NOTE — Assessment & Plan Note (Signed)
She had a very mild area of bruising (less than 2cm diameter) on both forearms. Will check cbc to ensure platelets OK

## 2014-10-18 ENCOUNTER — Telehealth: Payer: Self-pay | Admitting: Pulmonary Disease

## 2014-10-18 ENCOUNTER — Other Ambulatory Visit: Payer: Self-pay | Admitting: Pulmonary Disease

## 2014-10-18 ENCOUNTER — Other Ambulatory Visit: Payer: Self-pay | Admitting: *Deleted

## 2014-10-18 MED ORDER — AZELASTINE-FLUTICASONE 137-50 MCG/ACT NA SUSP: NASAL | Status: DC

## 2014-10-18 MED ORDER — FLUTICASONE PROPIONATE 50 MCG/ACT NA SUSP: 1.0000 | Freq: Two times a day (BID) | NASAL | Status: DC

## 2014-10-18 NOTE — Telephone Encounter (Signed)
Pharmacy calling to verify [place] prescription.  Nothing further needed.

## 2014-10-18 NOTE — Progress Notes (Signed)
Pts ins will not cover Dymista 1st w/o failing use of Fluticasone plus azelastine together. Called patient and explained to her. She will continue to use samples given then switch over to 2 separate nasal sprays.

## 2014-10-18 NOTE — Telephone Encounter (Signed)
PA for Dymista initiated through covermymeds Key: [id number] [facility] - [phone] Should receive response in 1-5 days.  To Caryl Pina to follow up.

## 2014-10-18 NOTE — Telephone Encounter (Signed)
Dymista has been approved.  Message sent to pharmacy. Nothing further needed.

## 2014-11-03 ENCOUNTER — Encounter: Payer: Self-pay | Admitting: Pulmonary Disease

## 2014-11-06 ENCOUNTER — Other Ambulatory Visit (INDEPENDENT_AMBULATORY_CARE_PROVIDER_SITE_OTHER): Payer: 59

## 2014-11-06 DIAGNOSIS — R05 Cough: Secondary | ICD-10-CM

## 2014-11-06 DIAGNOSIS — R059 Cough, unspecified: Secondary | ICD-10-CM

## 2014-11-06 DIAGNOSIS — J84112 Idiopathic pulmonary fibrosis: Secondary | ICD-10-CM

## 2014-11-06 LAB — CBC WITH DIFFERENTIAL/PLATELET
BASOS ABS: 0 10*3/uL (ref 0.0–0.1)
Basophils Relative: 0.8 % (ref 0.0–3.0)
Eosinophils Absolute: 0.2 10*3/uL (ref 0.0–0.7)
Eosinophils Relative: 2.7 % (ref 0.0–5.0)
HEMATOCRIT: 38.3 % (ref 36.0–46.0)
Hemoglobin: 12.4 g/dL (ref 12.0–15.0)
LYMPHS ABS: 1.6 10*3/uL (ref 0.7–4.0)
Lymphocytes Relative: 27.3 % (ref 12.0–46.0)
MCHC: 32.4 g/dL (ref 30.0–36.0)
MCV: 94.1 fl (ref 78.0–100.0)
MONO ABS: 0.4 10*3/uL (ref 0.1–1.0)
Monocytes Relative: 6.8 % (ref 3.0–12.0)
Neutro Abs: 3.6 10*3/uL (ref 1.4–7.7)
Neutrophils Relative %: 62.4 % (ref 43.0–77.0)
PLATELETS: 290 10*3/uL (ref 150.0–400.0)
RBC: 4.07 Mil/uL (ref 3.87–5.11)
RDW: 15.2 % (ref 11.5–15.5)
WBC: 5.7 10*3/uL (ref 4.0–10.5)

## 2014-11-06 LAB — COMPREHENSIVE METABOLIC PANEL
ALBUMIN: 4.3 g/dL (ref 3.5–5.2)
ALT: 19 U/L (ref 0–35)
AST: 17 U/L (ref 0–37)
Alkaline Phosphatase: 78 U/L (ref 39–117)
BUN: 11 mg/dL (ref 6–23)
CHLORIDE: 97 meq/L (ref 96–112)
CO2: 31 meq/L (ref 19–32)
Calcium: 9.7 mg/dL (ref 8.4–10.5)
Creatinine, Ser: 0.72 mg/dL (ref 0.40–1.20)
GFR: 86.69 mL/min (ref >60.00)
GLUCOSE: 221 mg/dL — AB (ref 70–99)
POTASSIUM: 3.9 meq/L (ref 3.5–5.1)
Sodium: 135 mEq/L (ref 135–145)
Total Bilirubin: 0.3 mg/dL (ref 0.2–1.2)
Total Protein: 6.6 g/dL (ref 6.0–8.3)

## 2014-11-22 ENCOUNTER — Telehealth: Payer: Self-pay

## 2014-11-22 NOTE — Telephone Encounter (Signed)
Pt left v/m; for 3 weeks blood sugar remaining in 200's and pt having leg pains for 3 weeks; pt has appt with Dr [name] for 3 mth f/u 12/21/14 and lab appt 12/14/14. Pt wants to know if should be seen sooner.pt request cb.

## 2014-11-23 NOTE — Telephone Encounter (Signed)
Yes, have pt make appt sooner in next 1-2weeks.

## 2014-11-23 NOTE — Telephone Encounter (Signed)
Left message for cell phone to call the office and schedule an appointment sometime in the next 1 to 2 weeks per Dr. [name].

## 2014-11-30 ENCOUNTER — Telehealth: Payer: Self-pay | Admitting: Family Medicine

## 2014-11-30 ENCOUNTER — Ambulatory Visit (INDEPENDENT_AMBULATORY_CARE_PROVIDER_SITE_OTHER): Payer: 59 | Admitting: Pulmonary Disease

## 2014-11-30 ENCOUNTER — Telehealth: Payer: Self-pay | Admitting: Pulmonary Disease

## 2014-11-30 ENCOUNTER — Encounter: Payer: Self-pay | Admitting: Pulmonary Disease

## 2014-11-30 ENCOUNTER — Other Ambulatory Visit (INDEPENDENT_AMBULATORY_CARE_PROVIDER_SITE_OTHER): Payer: 59

## 2014-11-30 VITALS — BP 126/74 | HR 81 | Ht 66.0 in | Wt 256.0 lb

## 2014-11-30 DIAGNOSIS — J9611 Chronic respiratory failure with hypoxia: Secondary | ICD-10-CM | POA: Diagnosis not present

## 2014-11-30 DIAGNOSIS — J84112 Idiopathic pulmonary fibrosis: Secondary | ICD-10-CM

## 2014-11-30 DIAGNOSIS — E1165 Type 2 diabetes mellitus with hyperglycemia: Secondary | ICD-10-CM | POA: Diagnosis not present

## 2014-11-30 DIAGNOSIS — G4733 Obstructive sleep apnea (adult) (pediatric): Secondary | ICD-10-CM

## 2014-11-30 DIAGNOSIS — IMO0002 Reserved for concepts with insufficient information to code with codable children: Secondary | ICD-10-CM

## 2014-11-30 LAB — COMPREHENSIVE METABOLIC PANEL
ALK PHOS: 84 U/L (ref 39–117)
ALT: 20 U/L (ref 0–35)
AST: 20 U/L (ref 0–37)
Albumin: 4.1 g/dL (ref 3.5–5.2)
BUN: 16 mg/dL (ref 6–23)
CO2: 34 mEq/L — ABNORMAL HIGH (ref 19–32)
Calcium: 10.3 mg/dL (ref 8.4–10.5)
Chloride: 96 mEq/L (ref 96–112)
Creatinine, Ser: 0.75 mg/dL (ref 0.40–1.20)
GFR: 82.69 mL/min (ref >60.00)
Glucose, Bld: 225 mg/dL — ABNORMAL HIGH (ref 70–99)
Potassium: 3.7 mEq/L (ref 3.5–5.1)
Sodium: 135 mEq/L (ref 135–145)
Total Bilirubin: 0.2 mg/dL (ref 0.2–1.2)
Total Protein: 7.2 g/dL (ref 6.0–8.3)

## 2014-11-30 LAB — LIPID PANEL
CHOLESTEROL: 260 mg/dL — AB (ref 0–200)
HDL: 51.9 mg/dL (ref >39.00)
NonHDL: 208.1
Total CHOL/HDL Ratio: 5
Triglycerides: 237 mg/dL — ABNORMAL HIGH (ref 0.0–149.0)
VLDL: 47.4 mg/dL — AB (ref 0.0–40.0)

## 2014-11-30 LAB — LDL CHOLESTEROL, DIRECT: Direct LDL: 173 mg/dL

## 2014-11-30 LAB — HEMOGLOBIN A1C: Hgb A1c MFr Bld: 8.5 % — ABNORMAL HIGH (ref 4.6–6.5)

## 2014-11-30 MED ORDER — MYCOPHENOLATE MOFETIL 500 MG PO TABS: 500.0000 mg | ORAL_TABLET | Freq: Every day | ORAL | Status: DC

## 2014-11-30 NOTE — Telephone Encounter (Signed)
-----   Message from Ellamae Sia sent at 11/24/2014 10:17 AM EDT ----- Regarding: Lab orders for Thursday, 5.12.16 Lab orders for a 3 month f/u

## 2014-11-30 NOTE — Telephone Encounter (Signed)
Called and spoke to [place], [place]. [name] requesting pt's monthly CBC be added on to pt's lab work done today at [facility]. Advised [name], unfortunately we cannot add on to their labs. [name] verbalized understanding and denied any further questions or concerns at this time.

## 2014-11-30 NOTE — Patient Instructions (Signed)
Try using trazodone 25 mg at night to help with sleep, if this does not help then tried using a low dose of Benadryl (12.5 mg)  Taking Esbriet as you are doing  Take CellCept 500 mg daily  We will see you back in 3 months or sooner

## 2014-11-30 NOTE — Progress Notes (Signed)
Subjective:    Patient ID: Kaitlin Hamilton, female    DOB: Feb 15, 1951, 64 y.o.   MRN: [id number]  Synopsis: Cade Olberding is a very pleasant 64 year old female who first saw the [facility] pulmonary clinic in March 2014 for evaluation of shortness of breath. She had significant cough, wheezing, and sputum production requiring multiple rounds of antibiotics. Because of crackles on lung exam and an abnormal pulmonary function test she was referred to [place]. We ordered full pulmonary function testing which showed moderate restriction and a depressed DLCO in proportion to her restriction. There is no airflow obstruction and no change with bronchodilator administration. A chest x-ray performed in February 2014 was read as normal.  An open lung biopsy was performed in December 2014 showing UIP.  Because she has had multiple good responses to steroids, we have treated her with cellcept and prednisone.  In January 2015 she had severe insomnia, hallucinations and gastritis on high dose prednisone.  She had a rash to bactrim. Since February 2015 she has been on cellcept and low dose prednisone and dapsone.  HPI Chief Complaint  Patient presents with  . Follow-up    Pt states her breathing at rest is improved, but still sob with exertion.  Pt has to turn 02 up to 6lpm to keep sats up. Pt up to maintenance dose of Esbriet and down to 560m Cellcept qd.    KZaideesays she has been doing better.  She says her breathing is OK.  She has also noted a rash on her hand for the last several weeks.  It is red with skin peeling.  It does not hurt.  She continues taking the Esbriet at three pills three times a day.  She continues to sleep with her CPAP machine but she has trouble falling asleep.    She has been having leg pain for three weeks now. She thinks it is arthritis.  It has worsened since tapering down the prednisone.    Past Medical History  Diagnosis Date  . Bronchitis   . Chicken pox   . Depression   .  Allergic rhinitis     uses Flonase daily  . Hyperlipidemia     lost 43 pounds and no meds required at present  . Hypokalemia   . Anxiety   . PONV (postoperative nausea and vomiting)   . Hypertension     takes Verapamil and HCTZ daily  . Shortness of breath     with exertion;takes Singulair daily as well as Flonase  . Pneumonia     last time in 2006  . History of bronchitis     2013  . H/O hiatal hernia   . Migraines     last one about a month ago  . Dysphagia   . Joint swelling     left thumb  . OA (osteoarthritis)     left knee  . GERD (gastroesophageal reflux disease)     takes Protonix daily  . Hemorrhoids   . History of colon polyps   . Diverticulosis   . Urinary frequency   . History of kidney stones   . Non-insulin dependent type 2 diabetes mellitus     takes Glipizide daily  . Constipation     takes Fiber daily  . Hypothyroidism (acquired)     takes Synthroid daily  . Insomnia     doesn't take any meds for this        Review of Systems  Constitutional: Negative for fever,  chills, activity change and fatigue.  HENT: Positive for congestion. Negative for ear pain, nosebleeds, postnasal drip, rhinorrhea and sinus pressure.   Respiratory: Positive for cough and shortness of breath. Negative for wheezing.   Cardiovascular: Positive for chest pain. Negative for palpitations and leg swelling.  Gastrointestinal: Negative for nausea and abdominal pain.       Objective:   Physical Exam  Filed Vitals:   11/30/14 0904  BP: 126/74  Pulse: 81  Height: _0  (1.676 m)  Weight: 256 lb (116.121 kg)  SpO2: 99%  3L [place]   Gen:  no acute distress  HEENT: NCAT,  EOMi, OP clear,  PULM: Crackles 2/3 way up bilaterally CV: RRR, no mgr, no JVD AB: BS+, soft, nontender, no hsm Ext: warm, no edema, no clubbing Neuro: A&Ox4, maew Derm: two small (<2cm areas of  Bruising over forearms bilaterally). There is no rash over the right chest, no fluctuance.  There is  tenderness to light palpation over the skin in a dermatoma distribution over the surgical scar on the right chest  February 2014 simple spirometry performed by her primary care physician>> ratio 90%, FEV1 1.71 L (64% predicted, FVC 1.83 L 55% predicted; flow volume loop is not consistent with obstruction February 2014 chest x-ray at [facility] normal 10/19/2012 walked 500 feet in office on room air oxygenation did not drop below 90% 11/15/2012 Full PFT [name]> Ratio 87%, FEV1 2.00L > 2.07 with bronchodilator (3% change); TLC 3.44 L (65% pred), ERV 0.62 (57% pred), DLCO 15.1 ml/mmHg/min (59% pred) 11/2012 CT chest >> ([facility] read) centrilobular nodules, interlobular septal thickening worse in bases and periphery R lung > L; some GGO in bases and periphery as well, some bronchiectasis in the bases R > L; findings suggestive of fibrosis but not UIP; question hypersensitivity pneumonitis given centrilobular nodules; also question aspiration 03/2013 ANA, ANCA, Anti-Jo-1, ESR, RF, SCL-70, anti-centromere, SSA/SSB, all negative; CRP 0.6;  04/2013 Barium swallow> abnormal esophageal motility, GERD, hiatal hernia 04/2013 Full PFT> Ratio 93%, FEV1 1.92 L (71% pred), TLC 3.02L (56% pred), DLCO 15.94 (59% pred) 04/2013 CT chest ([name])> findings suggestive of but not diagnostic of NSIP, small pulmonary nodule 04/2013 6MW RA > 1100 feet, HR peak 109, O2 sat Nadir 87% 04/2013 HP panel >> 05/17/2013 3 month prednisone trial 06/2013 open lung biopsy> UIP 07/28/13 Cell cept and bactrim started > bactrim stopped 07/29/13 due to rash  07/2013 hospitalized for confusion > prednisone 07/2013 hospitalized for gastritis/abdominal pain> prednisone 08/22/2013 atovaquone caused rash 11/2013 6MW 1364 feet, O2 saturation nadir 78% May 2015 full pulmonary function test ratio 93%, FEV1 1.71 L (64% predicted, no change with bronchodilator), total lung capacity 2.67 L (49% predicted), DLCO 13.11 (40% predicted) February 2016 CT chest high  resolution> slight progression in reticular abnormalities consistent with pulmonary fibrosis, uip      Assessment & Plan:   Chronic hypoxemic respiratory failure Continue using 3 L of oxygen at rest and 5-8 L with exertion   OSA (obstructive sleep apnea) Stable interval. Continue using CPAP with oxygen at night.  We will obtain a compliance report or insurance purposes.   UIP (usual interstitial pneumonitis) This has been a stable interval for [facility]. As stated in multiple previous notes, she has usual interstitial pneumonitis which is believed to be associated with a poorly understood connective tissue disease.  She has not had a major side effect Esbriet so far. She does have a mild rash on her hands which I cannot rule out as  related but it is mild in nature.  Plan: Continue Esbriet at 3 pills 3 times a day Continue monitoring liver function tests every 3 months Continue CellCept 500 mg daily definitely 3 months, we will obtain pulmonary function testing on that visit    > 25 minutes spend in consultation today  Updated Medication List Outpatient Encounter Prescriptions as of 11/30/2014  Medication Sig  . acetaminophen (TYLENOL) 500 MG tablet Take 1,000 mg by mouth every 6 (six) hours as needed for moderate pain.  . Azelastine-Fluticasone (DYMISTA) 137-50 MCG/ACT SUSP One puff per nostril twice daily  . diphenhydrAMINE (BENADRYL) 25 mg capsule Take 25 mg by mouth as needed.  Marland Kitchen FIBER PO Take 1 tablet by mouth daily.  . fluticasone (FLONASE) 50 MCG/ACT nasal spray Place 1 spray into both nostrils 2 (two) times daily.  Marland Kitchen glipiZIDE (GLUCOTROL) 10 MG tablet Take 1 tablet (10 mg total) by mouth 2 (two) times daily before a meal.  . hydrocortisone cream 0.5 % Apply 1 application topically 2 (two) times daily as needed (for rash).  Marland Kitchen ibuprofen (ADVIL,MOTRIN) 200 MG tablet Take 200 mg by mouth as needed.  Marland Kitchen levothyroxine (SYNTHROID, LEVOTHROID) 125 MCG tablet Take 125 mcg by mouth  daily before breakfast.  . losartan-hydrochlorothiazide (HYZAAR) 50-12.5 MG per tablet Take 1 tablet by mouth daily.  . montelukast (SINGULAIR) 10 MG tablet Take 10 mg by mouth at bedtime.  . mycophenolate (CELLCEPT) 500 MG tablet Take 1 tablet (500 mg total) by mouth daily.  . NON FORMULARY Place 2 L into the nose daily. 3 Liters with exertion  . nystatin cream (MYCOSTATIN) Apply 1 application topically 2 (two) times daily.  . ONE TOUCH ULTRA TEST test strip   . pantoprazole (PROTONIX) 40 MG tablet Take 1 tablet (40 mg total) by mouth 2 (two) times daily.  . Pirfenidone 267 MG CAPS Take 3 tablets by mouth 3 (three) times daily.   . saxagliptin HCl (ONGLYZA) 5 MG TABS tablet Take 1 tablet (5 mg total) by mouth daily.  . verapamil (VERELAN PM) 180 MG 24 hr capsule Take 180 mg by mouth daily.  . [DISCONTINUED] dapsone 100 MG tablet Take 1 tablet (100 mg total) by mouth daily.  . [DISCONTINUED] mycophenolate (CELLCEPT) 500 MG tablet Take 3 tablets (1,500 mg total) by mouth 2 (two) times daily. (Patient taking differently: Take 500 mg by mouth daily. )  . [DISCONTINUED] fluconazole (DIFLUCAN) 150 MG tablet Take 1 tablet (150 mg total) by mouth daily. (Patient not taking: Reported on 11/30/2014)  . [DISCONTINUED] lidocaine (LIDODERM) 5 % Place 1 patch onto the skin daily as needed. Remove & Discard patch within 12 hours or as directed by MD (Patient not taking: Reported on 11/30/2014)  . [DISCONTINUED] predniSONE (DELTASONE) 10 MG tablet Take 1 tablet (10 mg total) by mouth daily with breakfast. (Patient not taking: Reported on 11/30/2014)   No facility-administered encounter medications on file as of 11/30/2014.

## 2014-11-30 NOTE — Assessment & Plan Note (Addendum)
This has been a stable interval for [facility]. As stated in multiple previous notes, she has usual interstitial pneumonitis which is believed to be associated with a poorly understood connective tissue disease.  She has not had a major side effect Esbriet so far. She does have a mild rash on her hands which I cannot rule out as related but it is mild in nature.  Plan: Continue Esbriet at 3 pills 3 times a day Continue monitoring liver function tests every 3 months Continue CellCept 500 mg daily definitely 3 months, we will obtain pulmonary function testing on that visit

## 2014-11-30 NOTE — Assessment & Plan Note (Signed)
Stable interval. Continue using CPAP with oxygen at night.  We will obtain a compliance report or insurance purposes.

## 2014-11-30 NOTE — Assessment & Plan Note (Signed)
Continue using 3 L of oxygen at rest and 5-8 L with exertion

## 2014-12-05 ENCOUNTER — Ambulatory Visit (INDEPENDENT_AMBULATORY_CARE_PROVIDER_SITE_OTHER): Payer: 59 | Admitting: Family Medicine

## 2014-12-05 VITALS — BP 120/70 | HR 84 | Temp 97.9°F | Ht 66.0 in | Wt 259.0 lb

## 2014-12-05 DIAGNOSIS — E7800 Pure hypercholesterolemia, unspecified: Secondary | ICD-10-CM

## 2014-12-05 DIAGNOSIS — E1165 Type 2 diabetes mellitus with hyperglycemia: Secondary | ICD-10-CM | POA: Diagnosis not present

## 2014-12-05 DIAGNOSIS — I10 Essential (primary) hypertension: Secondary | ICD-10-CM

## 2014-12-05 DIAGNOSIS — M79604 Pain in right leg: Secondary | ICD-10-CM

## 2014-12-05 DIAGNOSIS — M79605 Pain in left leg: Secondary | ICD-10-CM | POA: Insufficient documentation

## 2014-12-05 DIAGNOSIS — IMO0002 Reserved for concepts with insufficient information to code with codable children: Secondary | ICD-10-CM

## 2014-12-05 LAB — HM DIABETES FOOT EXAM

## 2014-12-05 MED ORDER — PIOGLITAZONE HCL 30 MG PO TABS: 30.0000 mg | ORAL_TABLET | Freq: Every day | ORAL | Status: DC

## 2014-12-05 NOTE — Assessment & Plan Note (Signed)
Poor control. NOt at goal < 100. Pt with current prednisone related leg poan, had SE to statins in past, rash with red yeast rice. She is willing to rettry statin low dose but we will hold off until pain in legs improved.  Encouraged exercise, weight loss, healthy eating habits.

## 2014-12-05 NOTE — Progress Notes (Signed)
Pre visit review using our clinic review tool, if applicable. No additional management support is needed unless otherwise documented below in the visit note. 

## 2014-12-05 NOTE — Patient Instructions (Addendum)
Start fish oil 2000 mg divided daily. Work on [employer] and bread in diet. Do not skip meals. Make your meals instead of frozen meals. Start actos 30 mg daily.  Continue onglyza and glipizide. Call in 2-3 weeks with blood sugars. Follow up in 3 months with labs prior.

## 2014-12-05 NOTE — Progress Notes (Signed)
Subjective:    Patient ID: Kaitlin Hamilton, female    DOB: 1951/07/02, 64 y.o.   MRN: [id number]  HPI 64 year old female  on continuous oxygen for IPF presents for DM follow up. Started new lung medicine and increase cellcept in 10/2014.  She has not been sleeping well. She has been upset about daughter.  She has been having leg pain since being off prednisone (was on it for 18 months). Was worst 2 weeks after stopping prednisone.  Pain is in thighs to ankles.  Minimal swelling unless up on feet a lot. No pain or burning in feet, but tickly sensation on tops of feet and in legs. Using trazodone for sleep.. Makes her feel groggy, doped up.   Diabetes:   Poor control  in last 3 months on prednisone. Stopped prednsione on 10/21/2014. Treated with  Glucotrol 10 mg, and 5 mg of onglyza.  SE to metformin in past.  Lab Results  Component Value Date   HGBA1C 8.5* 11/30/2014  Using medications without difficulties:None Hypoglycemic episodes:None Hyperglycemic episodes:yes Feet problems:None, leg pains Blood Sugars averaging:  220-230 eye exam within last year: yes  Wt Readings from Last 3 Encounters:  12/05/14 259 lb (117.482 kg)  11/30/14 256 lb (116.121 kg)  10/06/14 263 lb (119.296 kg)   She has stopped soda, drinking water, still skipping meals and eating carbs.  Elevated Cholesterol:  Poor control on no medication. Red yeast rice caused a rash.   In past atorvastatin caused muscle ache. Lab Results  Component Value Date   CHOL 260* 11/30/2014   HDL 51.90 11/30/2014   LDLCALC 152* 09/12/2014   LDLDIRECT 173.0 11/30/2014   TRIG 237.0* 11/30/2014   CHOLHDL 5 11/30/2014  Using medications without problems: Muscle aches:  YES, see above Diet compliance: Poor Exercise: Minimal Other complaints:  Hypertension:   Well controlled on current regimen. BP Readings from Last 3 Encounters:  11/30/14 126/74  10/06/14 142/68  09/19/14 122/72  Using medication without problems or  lightheadedness: None Chest pain with exertion:None Edema:occ Short of breath:None Average home BPs: not checking Other issues:   Review of Systems  Constitutional: Negative for fever and fatigue.  HENT: Negative for ear pain.   Eyes: Negative for pain.  Respiratory: Negative for chest tightness and shortness of breath.   Cardiovascular: Negative for chest pain, palpitations and leg swelling.  Gastrointestinal: Negative for abdominal pain.  Genitourinary: Negative for dysuria.       Objective:   Physical Exam  Constitutional: Vital signs are normal. She appears well-developed and well-nourished. She is cooperative.  Non-toxic appearance. She does not appear ill. No distress.  HENT:  Head: Normocephalic.  Right Ear: Hearing, tympanic membrane, external ear and ear canal normal. Tympanic membrane is not erythematous, not retracted and not bulging.  Left Ear: Hearing, tympanic membrane, external ear and ear canal normal. Tympanic membrane is not erythematous, not retracted and not bulging.  Nose: No mucosal edema or rhinorrhea. Right sinus exhibits no maxillary sinus tenderness and no frontal sinus tenderness. Left sinus exhibits no maxillary sinus tenderness and no frontal sinus tenderness.  Mouth/Throat: Uvula is midline, oropharynx is clear and moist and mucous membranes are normal.  Eyes: Conjunctivae, EOM and lids are normal. Pupils are equal, round, and reactive to light. Lids are everted and swept, no foreign bodies found.  Neck: Trachea normal and normal range of motion. Neck supple. Carotid bruit is not present. No thyroid mass and no thyromegaly present.  Cardiovascular: Normal rate,  regular rhythm, S1 normal, S2 normal, normal heart sounds, intact distal pulses and normal pulses.  Exam reveals no gallop and no friction rub.   No murmur heard. No swelling.  Pulmonary/Chest: Effort normal. No tachypnea. No respiratory distress. She has decreased breath sounds. She has no  wheezes. She has no rhonchi. She has no rales.  Abdominal: Soft. Normal appearance and bowel sounds are normal. There is no tenderness.  Musculoskeletal:  ttp in thighs and calves bilaterally  Neurological: She is alert.  Skin: Skin is warm, dry and intact. No rash noted.  Psychiatric: Her speech is normal and behavior is normal. Judgment and thought content normal. Her mood appears not anxious. Cognition and memory are normal. She does not exhibit a depressed mood.    Diabetic foot exam: Normal inspection No skin breakdown No calluses  Normal DP pulses Normal sensation to light touch and monofilament Nails normal       Assessment & Plan:

## 2014-12-05 NOTE — Assessment & Plan Note (Signed)
No consistent with DM neuropathy.  Most likely related to stopping prednisone as well as osteoarthritis. No back pain.

## 2014-12-05 NOTE — Assessment & Plan Note (Signed)
Well controlled. Continue current medication.  

## 2014-12-05 NOTE — Assessment & Plan Note (Signed)
Poor control. Pt hesitant about insulin or victoza.  Will add actos to regimen.   Counseled on diet and lifestyle changes. Pt refused nutrition counseling as she has had recently along with pulm rehab.   Recheck A1C in 3 months, but she will call with measurements sooner.

## 2014-12-06 ENCOUNTER — Other Ambulatory Visit (INDEPENDENT_AMBULATORY_CARE_PROVIDER_SITE_OTHER): Payer: 59

## 2014-12-06 DIAGNOSIS — J84112 Idiopathic pulmonary fibrosis: Secondary | ICD-10-CM | POA: Diagnosis not present

## 2014-12-06 LAB — CBC WITH DIFFERENTIAL/PLATELET
BASOS ABS: 0 10*3/uL (ref 0.0–0.1)
Basophils Relative: 0.4 % (ref 0.0–3.0)
EOS ABS: 0.2 10*3/uL (ref 0.0–0.7)
Eosinophils Relative: 2.3 % (ref 0.0–5.0)
HEMATOCRIT: 39.2 % (ref 36.0–46.0)
Hemoglobin: 13 g/dL (ref 12.0–15.0)
LYMPHS ABS: 1.8 10*3/uL (ref 0.7–4.0)
Lymphocytes Relative: 24.4 % (ref 12.0–46.0)
MCHC: 33.2 g/dL (ref 30.0–36.0)
MCV: 88.3 fl (ref 78.0–100.0)
MONO ABS: 0.4 10*3/uL (ref 0.1–1.0)
Monocytes Relative: 5.9 % (ref 3.0–12.0)
NEUTROS ABS: 5 10*3/uL (ref 1.4–7.7)
Neutrophils Relative %: 67 % (ref 43.0–77.0)
Platelets: 260 10*3/uL (ref 150.0–400.0)
RBC: 4.44 Mil/uL (ref 3.87–5.11)
RDW: 14.6 % (ref 11.5–15.5)
WBC: 7.5 10*3/uL (ref 4.0–10.5)

## 2014-12-06 LAB — COMPREHENSIVE METABOLIC PANEL
ALBUMIN: 4.1 g/dL (ref 3.5–5.2)
ALT: 19 U/L (ref 0–35)
AST: 18 U/L (ref 0–37)
Alkaline Phosphatase: 86 U/L (ref 39–117)
BUN: 16 mg/dL (ref 6–23)
CALCIUM: 10.1 mg/dL (ref 8.4–10.5)
CO2: 31 meq/L (ref 19–32)
Chloride: 97 mEq/L (ref 96–112)
Creatinine, Ser: 0.74 mg/dL (ref 0.40–1.20)
GFR: 83.97 mL/min (ref >60.00)
Glucose, Bld: 226 mg/dL — ABNORMAL HIGH (ref 70–99)
POTASSIUM: 3.9 meq/L (ref 3.5–5.1)
Sodium: 135 mEq/L (ref 135–145)
Total Bilirubin: 0.2 mg/dL (ref 0.2–1.2)
Total Protein: 7 g/dL (ref 6.0–8.3)

## 2014-12-14 ENCOUNTER — Other Ambulatory Visit: Payer: 59

## 2014-12-19 ENCOUNTER — Encounter: Payer: Self-pay | Admitting: Internal Medicine

## 2014-12-19 ENCOUNTER — Ambulatory Visit (INDEPENDENT_AMBULATORY_CARE_PROVIDER_SITE_OTHER): Payer: 59 | Admitting: Internal Medicine

## 2014-12-19 VITALS — BP 124/70 | HR 86 | Temp 97.8°F | Wt 255.0 lb

## 2014-12-19 DIAGNOSIS — R21 Rash and other nonspecific skin eruption: Secondary | ICD-10-CM

## 2014-12-19 NOTE — Patient Instructions (Addendum)
Steroid injection today Continue Benadryl at night if needed Follow up with PCP Dr. [name] if not improvement  Rash A rash is a change in the color or texture of your skin. There are many different types of rashes. You may have other problems that accompany your rash. CAUSES   Infections.  Allergic reactions. This can include allergies to pets or foods.  Certain medicines.  Exposure to certain chemicals, soaps, or cosmetics.  Heat.  Exposure to poisonous plants.  Tumors, both cancerous and noncancerous. SYMPTOMS   Redness.  Scaly skin.  Itchy skin.  Dry or cracked skin.  Bumps.  Blisters.  Pain. DIAGNOSIS  Your caregiver may do a physical exam to determine what type of rash you have. A skin sample (biopsy) may be taken and examined under a microscope. TREATMENT  Treatment depends on the type of rash you have. Your caregiver may prescribe certain medicines. For serious conditions, you may need to see a skin doctor (dermatologist). HOME CARE INSTRUCTIONS   Avoid the substance that caused your rash.  Do not scratch your rash. This can cause infection.  You may take cool baths to help stop itching.  Only take over-the-counter or prescription medicines as directed by your caregiver.  Keep all follow-up appointments as directed by your caregiver. SEEK IMMEDIATE MEDICAL CARE IF:  You have increasing pain, swelling, or redness.  You have a fever.  You have new or severe symptoms.  You have body aches, diarrhea, or vomiting.  Your rash is not better after 3 days. MAKE SURE YOU:  Understand these instructions.  Will watch your condition.  Will get help right away if you are not doing well or get worse. Document Released: 06/27/2002 Document Revised: 09/29/2011 Document Reviewed: 04/21/2011 [facility] Patient Information 2015 [place], [place]. This information is not intended to replace advice given to you by your health care provider. Make sure you discuss  any questions you have with your health care provider.

## 2014-12-19 NOTE — Progress Notes (Signed)
Subjective:    Patient ID: Kaitlin Hamilton, female    DOB: 1951/04/18, 64 y.o.   MRN: [id number]  HPI  Pt presents to the clinic today with c/o a rash. The rash is located on her arms and legs. This started last night. The rash is very itchy. She has not come in contact with anything that she is allergic to. She has not changes soaps, lotions or detergents. She reports Dr. [name] just put her on Actos 12/05/14 to better control her DM 2. She has tried Benadryl with some relief. She reports she has never had a rash like this in the past.  Review of Systems      Past Medical History  Diagnosis Date  . Bronchitis   . Chicken pox   . Depression   . Allergic rhinitis     uses Flonase daily  . Hyperlipidemia     lost 43 pounds and no meds required at present  . Hypokalemia   . Anxiety   . PONV (postoperative nausea and vomiting)   . Hypertension     takes Verapamil and HCTZ daily  . Shortness of breath     with exertion;takes Singulair daily as well as Flonase  . Pneumonia     last time in 2006  . History of bronchitis     2013  . H/O hiatal hernia   . Migraines     last one about a month ago  . Dysphagia   . Joint swelling     left thumb  . OA (osteoarthritis)     left knee  . GERD (gastroesophageal reflux disease)     takes Protonix daily  . Hemorrhoids   . History of colon polyps   . Diverticulosis   . Urinary frequency   . History of kidney stones   . Non-insulin dependent type 2 diabetes mellitus     takes Glipizide daily  . Constipation     takes Fiber daily  . Hypothyroidism (acquired)     takes Synthroid daily  . Insomnia     doesn't take any meds for this    Current Outpatient Prescriptions  Medication Sig Dispense Refill  . acetaminophen (TYLENOL) 500 MG tablet Take 1,000 mg by mouth every 6 (six) hours as needed for moderate pain.    . Azelastine-Fluticasone (DYMISTA) 137-50 MCG/ACT SUSP One puff per nostril twice daily 23 g 5  . diphenhydrAMINE  (BENADRYL) 25 mg capsule Take 25 mg by mouth as needed.    Marland Kitchen FIBER PO Take 1 tablet by mouth daily.    Marland Kitchen glipiZIDE (GLUCOTROL) 10 MG tablet Take 1 tablet (10 mg total) by mouth 2 (two) times daily before a meal. 60 tablet 5  . hydrocortisone cream 0.5 % Apply 1 application topically 2 (two) times daily as needed (for rash).    Marland Kitchen ibuprofen (ADVIL,MOTRIN) 200 MG tablet Take 200 mg by mouth as needed.    Marland Kitchen levothyroxine (SYNTHROID, LEVOTHROID) 125 MCG tablet Take 125 mcg by mouth daily before breakfast.    . losartan-hydrochlorothiazide (HYZAAR) 50-12.5 MG per tablet Take 1 tablet by mouth daily. 90 tablet 3  . montelukast (SINGULAIR) 10 MG tablet Take 10 mg by mouth at bedtime.    . mycophenolate (CELLCEPT) 500 MG tablet Take 1 tablet (500 mg total) by mouth daily. 180 tablet 3  . NON FORMULARY Place 2 L into the nose daily. 3 Liters with exertion    . nystatin cream (MYCOSTATIN) Apply 1 application topically 2 (  two) times daily. 30 g 0  . Omega-3 Fatty Acids (FISH OIL) 1000 MG CAPS Take 2 capsules by mouth daily.    . ONE TOUCH ULTRA TEST test strip     . pantoprazole (PROTONIX) 40 MG tablet Take 1 tablet (40 mg total) by mouth 2 (two) times daily. 60 tablet 5  . pioglitazone (ACTOS) 30 MG tablet Take 1 tablet (30 mg total) by mouth daily. 30 tablet 11  . Pirfenidone 267 MG CAPS Take 3 tablets by mouth 3 (three) times daily.     . saxagliptin HCl (ONGLYZA) 5 MG TABS tablet Take 1 tablet (5 mg total) by mouth daily. 30 tablet 11  . traZODone (DESYREL) 50 MG tablet Take 25 mg by mouth at bedtime.    . verapamil (VERELAN PM) 180 MG 24 hr capsule Take 180 mg by mouth daily.     No current facility-administered medications for this visit.    Allergies  Allergen Reactions  . Adhesive [Tape]     Redness, bruising with any adhesives- bandaids, patches, etc.   . Atovaquone Itching  . Codeine Hives and Swelling  . Demerol [Meperidine] Hives and Swelling  . Hydrocodone Nausea Only  . Sulfa  Antibiotics Rash    Family History  Problem Relation Age of Onset  . Rheum arthritis Mother   . Rheum arthritis Sister   . Prostate cancer Brother   . Heart disease Maternal Grandmother   . Heart disease Maternal Grandfather   . Uterine cancer Daughter   . Colon cancer Neg Hx     History   Social History  . Marital Status: Widowed    Spouse Name: N/A  . Number of Children: 2  . Years of Education: N/A   Occupational History  . Day Care at [employer]    Social History Main Topics  . Smoking status: Never Smoker   . Smokeless tobacco: Never Used  . Alcohol Use: No  . Drug Use: No  . Sexual Activity: No   Other Topics Concern  . Not on file   Social History Narrative   Daily caffeine     Exercise: none recently    Diet: poor     Constitutional: Denies fever, malaise, fatigue, headache or abrupt weight changes.  Respiratory: Denies difficulty breathing, shortness of breath, cough or sputum production.   Cardiovascular: Denies chest pain, chest tightness, palpitations or swelling in the hands or feet.  Skin: Pt reports rash on arms and legs. Denies lesions or ulcercations.    No other specific complaints in a complete review of systems (except as listed in HPI above).  Objective:   Physical Exam  BP 124/70 mmHg  Pulse 86  Temp(Src) 97.8 F (36.6 C) (Oral)  Wt 255 lb (115.667 kg)  SpO2 97% Wt Readings from Last 3 Encounters:  12/19/14 255 lb (115.667 kg)  12/05/14 259 lb (117.482 kg)  11/30/14 256 lb (116.121 kg)    General: Appears her stated age, well developed, well nourished in NAD. Skin: Convalescent maculopapular rash noted on bilateral arms. Petechial rash noted on BLE. Cardiovascular: Normal rate and rhythm. S1,S2 noted.  No murmur, rubs or gallops noted. No JVD or BLE edema. No carotid bruits noted. Pulmonary/Chest: Normal effort and positive vesicular breath sounds. No respiratory distress. No wheezes, rales or ronchi noted.  Neurological: Alert and  oriented.   BMET    Component Value Date/Time   NA 135 12/06/2014 0938   NA 138 07/26/2014 1229   K 3.9 12/06/2014 8527  K 3.6 07/26/2014 1229   CL 97 12/06/2014 0938   CL 100 07/26/2014 1229   CO2 31 12/06/2014 0938   CO2 28 07/26/2014 1229   GLUCOSE 226* 12/06/2014 0938   GLUCOSE 164* 07/26/2014 1229   BUN 16 12/06/2014 0938   BUN 24* 07/26/2014 1229   CREATININE 0.74 12/06/2014 0938   CREATININE 1.07 07/26/2014 1229   CALCIUM 10.1 12/06/2014 0938   CALCIUM 9.5 07/26/2014 1229   GFRNONAA 62* 04/27/2014 1915   GFRAA 72* 04/27/2014 1915    Lipid Panel     Component Value Date/Time   CHOL 260* 11/30/2014 1052   TRIG 237.0* 11/30/2014 1052   HDL 51.90 11/30/2014 1052   CHOLHDL 5 11/30/2014 1052   VLDL 47.4* 11/30/2014 1052   LDLCALC 152* 09/12/2014 0823    CBC    Component Value Date/Time   WBC 7.5 12/06/2014 0938   WBC 14.7* 07/26/2014 1229   RBC 4.44 12/06/2014 0938   RBC 3.53* 07/26/2014 1229   HGB 13.0 12/06/2014 0938   HGB 10.9* 07/26/2014 1229   HCT 39.2 12/06/2014 0938   HCT 35.6 07/26/2014 1229   PLT 260.0 12/06/2014 0938   PLT 354 07/26/2014 1229   MCV 88.3 12/06/2014 0938   MCV 101* 07/26/2014 1229   MCH 30.8 07/26/2014 1229   MCH 31.5 04/27/2014 1915   MCHC 33.2 12/06/2014 0938   MCHC 30.5* 07/26/2014 1229   RDW 14.6 12/06/2014 0938   RDW 17.4* 07/26/2014 1229   LYMPHSABS 1.8 12/06/2014 0938   LYMPHSABS 2.7 07/26/2014 1229   MONOABS 0.4 12/06/2014 0938   MONOABS 0.7 07/26/2014 1229   EOSABS 0.2 12/06/2014 0938   EOSABS 0.2 07/26/2014 1229   BASOSABS 0.0 12/06/2014 0938   BASOSABS 0.1 07/26/2014 1229    Hgb A1C Lab Results  Component Value Date   HGBA1C 8.5* 11/30/2014         Assessment & Plan:   Rash on bilateral arms:  Appears to be contact dermatitis Too large of an area for a topical steroid I do not want to do oral steroids given her A1C 80 mg Depo IM today Continue Benadryl at night as needed  Rash on bilateral  legs:  Petechial Should resolve with time  RTC as needed or if symptoms persist or worsen

## 2014-12-21 ENCOUNTER — Ambulatory Visit: Payer: 59 | Admitting: Family Medicine

## 2015-01-08 ENCOUNTER — Other Ambulatory Visit (INDEPENDENT_AMBULATORY_CARE_PROVIDER_SITE_OTHER): Payer: 59

## 2015-01-08 DIAGNOSIS — J84112 Idiopathic pulmonary fibrosis: Secondary | ICD-10-CM | POA: Diagnosis not present

## 2015-01-08 LAB — CBC WITH DIFFERENTIAL/PLATELET
Basophils Absolute: 0.1 10*3/uL (ref 0.0–0.1)
Basophils Relative: 0.6 % (ref 0.0–3.0)
EOS PCT: 2.7 % (ref 0.0–5.0)
Eosinophils Absolute: 0.2 10*3/uL (ref 0.0–0.7)
HEMATOCRIT: 40.2 % (ref 36.0–46.0)
HEMOGLOBIN: 12.9 g/dL (ref 12.0–15.0)
LYMPHS ABS: 2.2 10*3/uL (ref 0.7–4.0)
Lymphocytes Relative: 24.3 % (ref 12.0–46.0)
MCHC: 32.1 g/dL (ref 30.0–36.0)
MCV: 88.9 fl (ref 78.0–100.0)
MONOS PCT: 4.9 % (ref 3.0–12.0)
Monocytes Absolute: 0.4 10*3/uL (ref 0.1–1.0)
NEUTROS ABS: 6 10*3/uL (ref 1.4–7.7)
Neutrophils Relative %: 67.5 % (ref 43.0–77.0)
Platelets: 279 10*3/uL (ref 150.0–400.0)
RBC: 4.52 Mil/uL (ref 3.87–5.11)
RDW: 15.1 % (ref 11.5–15.5)
WBC: 8.9 10*3/uL (ref 4.0–10.5)

## 2015-01-08 LAB — COMPREHENSIVE METABOLIC PANEL
ALBUMIN: 4.1 g/dL (ref 3.5–5.2)
ALT: 14 U/L (ref 0–35)
AST: 15 U/L (ref 0–37)
Alkaline Phosphatase: 87 U/L (ref 39–117)
BUN: 16 mg/dL (ref 6–23)
CALCIUM: 9.7 mg/dL (ref 8.4–10.5)
CO2: 31 meq/L (ref 19–32)
CREATININE: 0.81 mg/dL (ref 0.40–1.20)
Chloride: 97 mEq/L (ref 96–112)
GFR: 75.63 mL/min (ref >60.00)
Glucose, Bld: 156 mg/dL — ABNORMAL HIGH (ref 70–99)
Potassium: 4 mEq/L (ref 3.5–5.1)
SODIUM: 135 meq/L (ref 135–145)
Total Bilirubin: 0.3 mg/dL (ref 0.2–1.2)
Total Protein: 7 g/dL (ref 6.0–8.3)

## 2015-01-16 ENCOUNTER — Other Ambulatory Visit: Payer: Self-pay

## 2015-01-16 DIAGNOSIS — J3089 Other allergic rhinitis: Secondary | ICD-10-CM

## 2015-01-16 MED ORDER — MONTELUKAST SODIUM 10 MG PO TABS: 10.0000 mg | ORAL_TABLET | Freq: Every day | ORAL | Status: DC

## 2015-01-23 ENCOUNTER — Telehealth: Payer: Self-pay | Admitting: Pulmonary Disease

## 2015-01-23 NOTE — Telephone Encounter (Signed)
From 11/30/14 OV: Patient Instructions     Try using trazodone 25 mg at night to help with sleep, if this does not help then tried using a low dose of Benadryl (12.5 mg)  Taking Esbriet as you are doing  Take CellCept 500 mg daily  We will see you back in 3 months or sooner   Spoke with pt. Advised her what BQ had in her last note. Nothing further was needed.

## 2015-01-24 ENCOUNTER — Encounter: Payer: Self-pay | Admitting: Pulmonary Disease

## 2015-02-07 ENCOUNTER — Other Ambulatory Visit (INDEPENDENT_AMBULATORY_CARE_PROVIDER_SITE_OTHER): Payer: 59

## 2015-02-07 DIAGNOSIS — J84112 Idiopathic pulmonary fibrosis: Secondary | ICD-10-CM

## 2015-02-07 LAB — COMPREHENSIVE METABOLIC PANEL
ALT: 15 U/L (ref 0–35)
AST: 17 U/L (ref 0–37)
Albumin: 4.3 g/dL (ref 3.5–5.2)
Alkaline Phosphatase: 88 U/L (ref 39–117)
BILIRUBIN TOTAL: 0.4 mg/dL (ref 0.2–1.2)
BUN: 13 mg/dL (ref 6–23)
CALCIUM: 10 mg/dL (ref 8.4–10.5)
CHLORIDE: 100 meq/L (ref 96–112)
CO2: 30 mEq/L (ref 19–32)
Creatinine, Ser: 0.74 mg/dL (ref 0.40–1.20)
GFR: 83.93 mL/min (ref >60.00)
Glucose, Bld: 140 mg/dL — ABNORMAL HIGH (ref 70–99)
Potassium: 4.2 mEq/L (ref 3.5–5.1)
Sodium: 140 mEq/L (ref 135–145)
TOTAL PROTEIN: 7.4 g/dL (ref 6.0–8.3)

## 2015-02-07 LAB — CBC WITH DIFFERENTIAL/PLATELET
Basophils Absolute: 0 10*3/uL (ref 0.0–0.1)
Basophils Relative: 0.4 % (ref 0.0–3.0)
EOS ABS: 0.2 10*3/uL (ref 0.0–0.7)
Eosinophils Relative: 2.6 % (ref 0.0–5.0)
HCT: 38.2 % (ref 36.0–46.0)
Hemoglobin: 12.6 g/dL (ref 12.0–15.0)
LYMPHS ABS: 2.1 10*3/uL (ref 0.7–4.0)
LYMPHS PCT: 24.5 % (ref 12.0–46.0)
MCHC: 33 g/dL (ref 30.0–36.0)
MCV: 88.2 fl (ref 78.0–100.0)
Monocytes Absolute: 0.5 10*3/uL (ref 0.1–1.0)
Monocytes Relative: 5.7 % (ref 3.0–12.0)
NEUTROS ABS: 5.8 10*3/uL (ref 1.4–7.7)
Neutrophils Relative %: 66.8 % (ref 43.0–77.0)
Platelets: 235 10*3/uL (ref 150.0–400.0)
RBC: 4.34 Mil/uL (ref 3.87–5.11)
RDW: 16.8 % — ABNORMAL HIGH (ref 11.5–15.5)
WBC: 8.6 10*3/uL (ref 4.0–10.5)

## 2015-02-09 ENCOUNTER — Telehealth: Payer: Self-pay | Admitting: *Deleted

## 2015-02-09 NOTE — Telephone Encounter (Signed)
Dymista was approved 10/18/14. Received PA request from pt pharmacy for another PA. Pts ins. Formulary change 01/2015 and now Dymista is non-formulary. Another PA will have to be done. Form is being faxed for completion. [id number] Allow 24-72 hrs patient aware new PA in process.

## 2015-02-13 NOTE — Telephone Encounter (Signed)
PA received for Dymista.  PA submitted to cover my meds. Key is [id number] Pending review

## 2015-02-13 NOTE — Telephone Encounter (Signed)
Have Dr. [name] signed this yet?

## 2015-02-13 NOTE — Telephone Encounter (Signed)
Does he need to sign this if it's been done through CoverMyMeds?

## 2015-02-14 NOTE — Telephone Encounter (Signed)
Received a denial letter via fax for Dymista. It states  To submit medical records documenting history of trial and failure, inadequate response, or intolerance to Fluticasone used in combination with Azelastine with date and duration of trial. Pt has used Flonase only.   Will fax denial letter to [facility] to see what BQ would like to do next.  Ref# [id number] [facility] through Cover My Meds

## 2015-02-16 NOTE — Telephone Encounter (Signed)
Dr. [name] please advise if you'd like to proceed with an appeal or if you would like her to try another med.  Thanks!

## 2015-02-18 NOTE — Telephone Encounter (Signed)
No appeal. Just need to Rx Azelastine and OTC Flonase

## 2015-02-19 ENCOUNTER — Telehealth: Payer: Self-pay | Admitting: Pulmonary Disease

## 2015-02-19 MED ORDER — AZELASTINE HCL 0.1 % NA SOLN: 1.0000 | Freq: Two times a day (BID) | NASAL | Status: DC

## 2015-02-19 NOTE — Addendum Note (Signed)
Addended by: Rosana Berger on: 02/19/2015 09:22 AM   Modules accepted: Orders

## 2015-02-19 NOTE — Telephone Encounter (Signed)
I spoke with the pt and notified of recs per BQ  She verbalized understanding  She will get flonase otc and I have sent rx for astelin Nothing further needed

## 2015-02-19 NOTE — Telephone Encounter (Signed)
See PN dated 7/22- I spoke with her before this call was placed

## 2015-02-26 ENCOUNTER — Other Ambulatory Visit: Payer: Self-pay | Admitting: *Deleted

## 2015-02-26 MED ORDER — GLIPIZIDE 10 MG PO TABS: 10.0000 mg | ORAL_TABLET | Freq: Two times a day (BID) | ORAL | Status: DC

## 2015-02-28 ENCOUNTER — Encounter: Payer: Self-pay | Admitting: Pulmonary Disease

## 2015-02-28 ENCOUNTER — Ambulatory Visit (INDEPENDENT_AMBULATORY_CARE_PROVIDER_SITE_OTHER): Payer: 59 | Admitting: Pulmonary Disease

## 2015-02-28 VITALS — BP 136/74 | HR 80 | Ht 66.0 in | Wt 262.0 lb

## 2015-02-28 DIAGNOSIS — J84112 Idiopathic pulmonary fibrosis: Secondary | ICD-10-CM

## 2015-02-28 DIAGNOSIS — Z5181 Encounter for therapeutic drug level monitoring: Secondary | ICD-10-CM

## 2015-02-28 NOTE — Progress Notes (Signed)
Subjective:    Patient ID: Kaitlin Hamilton, female    DOB: [birth year], 64 y.o.   MRN: [id number]  Synopsis: Kaitlin Hamilton is a very pleasant 64 year old female who first saw the [facility] pulmonary clinic in March 2014 for evaluation of shortness of breath. She had significant cough, wheezing, and sputum production requiring multiple rounds of antibiotics. Because of crackles on lung exam and an abnormal pulmonary function test she was referred to [place]. We ordered full pulmonary function testing which showed moderate restriction and a depressed DLCO in proportion to her restriction. There is no airflow obstruction and no change with bronchodilator administration. A chest x-ray performed in February 2014 was read as normal.  An open lung biopsy was performed in December 2014 showing UIP.  Because she has had multiple good responses to steroids, we have treated her with cellcept and prednisone.  In January 2015 she had severe insomnia, hallucinations and gastritis on high dose prednisone.  She had a rash to bactrim. Since February 2015 she has been on cellcept and low dose prednisone and dapsone. Because of progression in hypoxemia and lung function testing in 2016 we changed her therapy to anti-fibrotic therapy with Esbriet. Because of a strong concern for an underlying connective tissue disease it was decided that she will be maintained on low-dose CellCept.  HPI Chief Complaint  Patient presents with  . Follow-up    Pt doing well, no breathing complaints today.  states that the Trazadone makes her residually drowsy in the mornings, doesn't like to take it.     Kaitlin Hamilton has been doing well since the last visit.  She has been having some leg pain.  Her breathing has been stable.  She says that the [place] has been working well for her.  She feels like her oxygen level has been higher on 3L (98%), she turns it up to 5Lpm when vacuuming.  She has noticed that her oxygen drops when she showers.  She doesn't  have upset stomach or diarrhea on the Esbriet.  She is ttaking 3 pills a day.    She did have some diverticulosis symptoms a month or two ago.  She is coughing a little but not too bad.     Past Medical History  Diagnosis Date  . Bronchitis   . Chicken pox   . Depression   . Allergic rhinitis     uses Flonase daily  . Hyperlipidemia     lost 43 pounds and no meds required at present  . Hypokalemia   . Anxiety   . PONV (postoperative nausea and vomiting)   . Hypertension     takes Verapamil and HCTZ daily  . Shortness of breath     with exertion;takes Singulair daily as well as Flonase  . Pneumonia     last time in 2006  . History of bronchitis     2013  . H/O hiatal hernia   . Migraines     last one about a month ago  . Dysphagia   . Joint swelling     left thumb  . OA (osteoarthritis)     left knee  . GERD (gastroesophageal reflux disease)     takes Protonix daily  . Hemorrhoids   . History of colon polyps   . Diverticulosis   . Urinary frequency   . History of kidney stones   . Non-insulin dependent type 2 diabetes mellitus     takes Glipizide daily  . Constipation  takes Fiber daily  . Hypothyroidism (acquired)     takes Synthroid daily  . Insomnia     doesn't take any meds for this        Review of Systems  Constitutional: Negative for fever, chills, activity change and fatigue.  HENT: Negative for congestion, ear pain, nosebleeds, postnasal drip, rhinorrhea and sinus pressure.   Respiratory: Positive for shortness of breath. Negative for cough and wheezing.   Cardiovascular: Negative for chest pain, palpitations and leg swelling.  Gastrointestinal: Negative for nausea and abdominal pain.       Objective:   Physical Exam  Filed Vitals:   02/28/15 0935  BP: 136/74  Pulse: 80  Height: '5\' 6"'  (1.676 m)  Weight: 262 lb (118.842 kg)  SpO2: 100%  3L [place]   Gen:  no acute distress  HEENT: NCAT,  EOMi, OP clear,  PULM: Crackles 2/3 way up  bilaterally CV: RRR, no mgr, no JVD AB: BS+, soft, nontender, no hsm Ext: warm, no edema, no clubbing Neuro: A&Ox4, maew   February 2014 simple spirometry performed by her primary care physician>> ratio 90%, FEV1 1.71 L (64% predicted, FVC 1.83 L 55% predicted; flow volume loop is not consistent with obstruction February 2014 chest [facility] at [facility] normal 10/19/2012 walked 500 feet in office on room air oxygenation did not drop below 90% 11/15/2012 Full PFT [name]> Ratio 87%, FEV1 2.00L > 2.07 with bronchodilator (3% change); TLC 3.44 L (65% pred), ERV 0.62 (57% pred), DLCO 15.1 ml/mmHg/min (59% pred) 11/2012 CT chest >> (Kellianne Ek read) centrilobular nodules, interlobular septal thickening worse in bases and periphery R lung > L; some GGO in bases and periphery as well, some bronchiectasis in the bases R > L; findings suggestive of fibrosis but not UIP; question hypersensitivity pneumonitis given centrilobular nodules; also question aspiration 03/2013 ANA, ANCA, Anti-Jo-1, ESR, RF, SCL-70, anti-centromere, SSA/SSB, all negative; CRP 0.6;  04/2013 Barium swallow> abnormal esophageal motility, GERD, hiatal hernia 04/2013 Full PFT> Ratio 93%, FEV1 1.92 L (71% pred), TLC 3.02L (56% pred), DLCO 15.94 (59% pred) 04/2013 CT chest ([name])> findings suggestive of but not diagnostic of NSIP, small pulmonary nodule 04/2013 6MW RA > 1100 feet, HR peak 109, O2 sat Nadir 87% 04/2013 HP panel >> 05/17/2013 3 month prednisone trial 06/2013 open lung biopsy> UIP 07/28/13 Cell cept and bactrim started > bactrim stopped 07/29/13 due to rash  07/2013 hospitalized for confusion > prednisone 07/2013 hospitalized for gastritis/abdominal pain> prednisone 08/22/2013 atovaquone caused rash 11/2013 6MW 1364 feet, O2 saturation nadir 78% May 2015 full pulmonary function test ratio 93%, FEV1 1.71 L (64% predicted, no change with bronchodilator), total lung capacity 2.67 L (49% predicted), DLCO 13.11 (40% predicted) February 2016 CT  chest high resolution> slight progression in reticular abnormalities consistent with pulmonary fibrosis, uip 08/21/14 6MW 1400 feet 90% on 8L     Assessment & Plan:   UIP (usual interstitial pneumonitis) This has been a stable interval for [place]. She has not had significant progression in symptoms in the last several months which is great. We continue to treat her for usual interstitial pneumonitis as well as a nondescript inflammatory condition. To that end, she takes Esbriet as well as CellCept.  Plan: Continue therapeutic drug monitoring for Esbriet and CellCept, we will reschedule her labs for every 3 months Schedule pulmonary function test in 6 minute walk Follow-up 3 months  Chronic hypoxemic respiratory failure Continue 3 L at rest and 5 L with exercise.     Updated Medication  List Outpatient Encounter Prescriptions as of 02/28/2015  Medication Sig  . acetaminophen (TYLENOL) 500 MG tablet Take 1,000 mg by mouth every 6 (six) hours as needed for moderate pain.  Marland Kitchen azelastine (ASTELIN) 0.1 % nasal spray Place 1 spray into both nostrils 2 (two) times daily. Use in each nostril as directed  . Azelastine-Fluticasone (DYMISTA) 137-50 MCG/ACT SUSP One puff per nostril twice daily  . diphenhydrAMINE (BENADRYL) 25 mg capsule Take 25 mg by mouth as needed.  Marland Kitchen FIBER PO Take 1 tablet by mouth daily.  Marland Kitchen glipiZIDE (GLUCOTROL) 10 MG tablet Take 1 tablet (10 mg total) by mouth 2 (two) times daily before a meal.  . hydrocortisone cream 0.5 % Apply 1 application topically 2 (two) times daily as needed (for rash).  Marland Kitchen ibuprofen (ADVIL,MOTRIN) 200 MG tablet Take 200 mg by mouth as needed.  Marland Kitchen levothyroxine (SYNTHROID, LEVOTHROID) 125 MCG tablet Take 125 mcg by mouth daily before breakfast.  . losartan-hydrochlorothiazide (HYZAAR) 50-12.5 MG per tablet Take 1 tablet by mouth daily.  . montelukast (SINGULAIR) 10 MG tablet Take 1 tablet (10 mg total) by mouth at bedtime.  . mycophenolate (CELLCEPT) 500  MG tablet Take 1 tablet (500 mg total) by mouth daily.  . NON FORMULARY Place 3 L into the nose daily. 4 Liters with exertion  . nystatin cream (MYCOSTATIN) Apply 1 application topically 2 (two) times daily.  . Omega-3 Fatty Acids (FISH OIL) 1000 MG CAPS Take 2 capsules by mouth daily.  . ONE TOUCH ULTRA TEST test strip   . pantoprazole (PROTONIX) 40 MG tablet Take 1 tablet (40 mg total) by mouth 2 (two) times daily.  . pioglitazone (ACTOS) 30 MG tablet Take 1 tablet (30 mg total) by mouth daily.  . Pirfenidone 267 MG CAPS Take 3 tablets by mouth 3 (three) times daily.   . saxagliptin HCl (ONGLYZA) 5 MG TABS tablet Take 1 tablet (5 mg total) by mouth daily.  . traZODone (DESYREL) 50 MG tablet Take 25 mg by mouth at bedtime.  . verapamil (VERELAN PM) 180 MG 24 hr capsule Take 180 mg by mouth daily.   No facility-administered encounter medications on file as of 02/28/2015.

## 2015-02-28 NOTE — Assessment & Plan Note (Signed)
Continue 3 L at rest and 5 L with exercise.

## 2015-02-28 NOTE — Patient Instructions (Signed)
We will reschedule your lab work for October, then we will do it every 3 months after that Keep taking your medications as you're doing We will schedule a lung function test and a 6 minute walk in [place] We will see you back in November

## 2015-02-28 NOTE — Assessment & Plan Note (Signed)
This has been a stable interval for [facility]. She has not had significant progression in symptoms in the last several months which is great. We continue to treat her for usual interstitial pneumonitis as well as a nondescript inflammatory condition. To that end, she takes Esbriet as well as CellCept.  Plan: Continue therapeutic drug monitoring for Esbriet and CellCept, we will reschedule her labs for every 3 months Schedule pulmonary function test in 6 minute walk Follow-up 3 months

## 2015-03-01 ENCOUNTER — Ambulatory Visit (INDEPENDENT_AMBULATORY_CARE_PROVIDER_SITE_OTHER): Payer: 59 | Admitting: Pulmonary Disease

## 2015-03-01 DIAGNOSIS — J9611 Chronic respiratory failure with hypoxia: Secondary | ICD-10-CM

## 2015-03-01 DIAGNOSIS — J84112 Idiopathic pulmonary fibrosis: Secondary | ICD-10-CM

## 2015-03-01 LAB — PULMONARY FUNCTION TEST
DL/VA % pred: 74 %
DL/VA: 3.78 ml/min/mmHg/L
DLCO unc % pred: 106 %
DLCO unc: 28.75 ml/min/mmHg
FEF 25-75 PRE: 1.98 L/s
FEF 25-75 Post: 2.07 L/sec
FEF2575-%CHANGE-POST: 4 %
FEF2575-%PRED-POST: 90 %
FEF2575-%Pred-Pre: 87 %
FEV1-%CHANGE-POST: -3 %
FEV1-%Pred-Post: 47 %
FEV1-%Pred-Pre: 48 %
FEV1-PRE: 1.29 L
FEV1-Post: 1.25 L
FEV1FVC-%Change-Post: 6 %
FEV1FVC-%Pred-Pre: 116 %
FEV6-%Change-Post: -9 %
FEV6-%PRED-POST: 39 %
FEV6-%Pred-Pre: 43 %
FEV6-POST: 1.3 L
FEV6-PRE: 1.43 L
FEV6FVC-%Pred-Post: 104 %
FEV6FVC-%Pred-Pre: 104 %
FVC-%Change-Post: -9 %
FVC-%Pred-Post: 37 %
FVC-%Pred-Pre: 41 %
FVC-POST: 1.3 L
FVC-PRE: 1.43 L
Post FEV1/FVC ratio: 96 %
Post FEV6/FVC ratio: 100 %
Pre FEV1/FVC ratio: 90 %
Pre FEV6/FVC Ratio: 100 %

## 2015-03-01 NOTE — Progress Notes (Signed)
SMW performed today. 

## 2015-03-01 NOTE — Progress Notes (Signed)
PFT performed today with Nitrogen washout. 

## 2015-03-05 ENCOUNTER — Telehealth: Payer: Self-pay | Admitting: Family Medicine

## 2015-03-05 ENCOUNTER — Other Ambulatory Visit (INDEPENDENT_AMBULATORY_CARE_PROVIDER_SITE_OTHER): Payer: 59

## 2015-03-05 DIAGNOSIS — IMO0002 Reserved for concepts with insufficient information to code with codable children: Secondary | ICD-10-CM

## 2015-03-05 DIAGNOSIS — E1165 Type 2 diabetes mellitus with hyperglycemia: Secondary | ICD-10-CM

## 2015-03-05 DIAGNOSIS — E039 Hypothyroidism, unspecified: Secondary | ICD-10-CM | POA: Diagnosis not present

## 2015-03-05 LAB — LIPID PANEL
CHOLESTEROL: 246 mg/dL — AB (ref 0–200)
HDL: 53.8 mg/dL (ref >39.00)
LDL Cholesterol: 155 mg/dL — ABNORMAL HIGH (ref 0–99)
NonHDL: 191.96
TRIGLYCERIDES: 183 mg/dL — AB (ref 0.0–149.0)
Total CHOL/HDL Ratio: 5
VLDL: 36.6 mg/dL (ref 0.0–40.0)

## 2015-03-05 LAB — COMPREHENSIVE METABOLIC PANEL
ALK PHOS: 71 U/L (ref 39–117)
ALT: 14 U/L (ref 0–35)
AST: 17 U/L (ref 0–37)
Albumin: 4.2 g/dL (ref 3.5–5.2)
BUN: 12 mg/dL (ref 6–23)
CHLORIDE: 99 meq/L (ref 96–112)
CO2: 32 meq/L (ref 19–32)
Calcium: 9.7 mg/dL (ref 8.4–10.5)
Creatinine, Ser: 0.71 mg/dL (ref 0.40–1.20)
GFR: 88.01 mL/min (ref >60.00)
GLUCOSE: 134 mg/dL — AB (ref 70–99)
POTASSIUM: 4 meq/L (ref 3.5–5.1)
Sodium: 138 mEq/L (ref 135–145)
Total Bilirubin: 0.4 mg/dL (ref 0.2–1.2)
Total Protein: 7.2 g/dL (ref 6.0–8.3)

## 2015-03-05 LAB — HEMOGLOBIN A1C: Hgb A1c MFr Bld: 6.9 % — ABNORMAL HIGH (ref 4.6–6.5)

## 2015-03-05 LAB — TSH: TSH: 12.08 u[IU]/mL — ABNORMAL HIGH (ref 0.35–4.50)

## 2015-03-05 NOTE — Telephone Encounter (Signed)
-----   Message from Ellamae Sia sent at 02/28/2015  2:28 PM EDT ----- Regarding: Lab orders for Monday, 8.15.16 Lab orders for a 3 month follow up appt.

## 2015-03-07 ENCOUNTER — Other Ambulatory Visit: Payer: Self-pay

## 2015-03-07 DIAGNOSIS — J84112 Idiopathic pulmonary fibrosis: Secondary | ICD-10-CM

## 2015-03-09 ENCOUNTER — Other Ambulatory Visit (HOSPITAL_COMMUNITY): Payer: Self-pay | Admitting: Respiratory Therapy

## 2015-03-09 ENCOUNTER — Ambulatory Visit (INDEPENDENT_AMBULATORY_CARE_PROVIDER_SITE_OTHER): Payer: 59 | Admitting: Family Medicine

## 2015-03-09 ENCOUNTER — Encounter: Payer: Self-pay | Admitting: Family Medicine

## 2015-03-09 VITALS — BP 136/74 | HR 75 | Temp 97.6°F | Ht 66.0 in | Wt 260.5 lb

## 2015-03-09 DIAGNOSIS — M79645 Pain in left finger(s): Secondary | ICD-10-CM

## 2015-03-09 DIAGNOSIS — E1165 Type 2 diabetes mellitus with hyperglycemia: Secondary | ICD-10-CM | POA: Diagnosis not present

## 2015-03-09 DIAGNOSIS — E7800 Pure hypercholesterolemia, unspecified: Secondary | ICD-10-CM

## 2015-03-09 DIAGNOSIS — IMO0002 Reserved for concepts with insufficient information to code with codable children: Secondary | ICD-10-CM

## 2015-03-09 DIAGNOSIS — M5431 Sciatica, right side: Secondary | ICD-10-CM | POA: Diagnosis not present

## 2015-03-09 DIAGNOSIS — I10 Essential (primary) hypertension: Secondary | ICD-10-CM

## 2015-03-09 DIAGNOSIS — G8929 Other chronic pain: Secondary | ICD-10-CM

## 2015-03-09 MED ORDER — PRAVASTATIN SODIUM 10 MG PO TABS: 10.0000 mg | ORAL_TABLET | Freq: Every day | ORAL | Status: DC

## 2015-03-09 MED ORDER — DICLOFENAC SODIUM 75 MG PO TBEC: 75.0000 mg | DELAYED_RELEASE_TABLET | Freq: Two times a day (BID) | ORAL | Status: DC

## 2015-03-09 NOTE — Progress Notes (Signed)
Pre visit review using our clinic review tool, if applicable. No additional management support is needed unless otherwise documented below in the visit note. 

## 2015-03-09 NOTE — Assessment & Plan Note (Signed)
Start low dose pravastatin given SE to atorvastatin in past.. Follow up in 3 months.

## 2015-03-09 NOTE — Progress Notes (Signed)
64 year old female on continuous oxygen for UIP/IPF presents for DM follow up. Started new lung medicine and continued cellcept in 10/2014. She has been OFF PREDNSIONE in last 5 months.  She has continue having right buttock and radiates down right leg pan.  Worse with standing for a while, walking, bending over not hurting. No numbness, no weakness. NO fever, no perineal numbness, no incotinence.  She has been having pain and swelling off and on in left medial wrist, at base on left thumb up into  [facility]. Hx of falling on left wrist last year. 09/2013.. nml X-ray.  Wore brace.  She noted both these issues after stopping prednisone.   Diabetes: Improved control off prednsione in last 3 months.Treated with Glucotrol 10 mg, and 5 mg of onglyza. SE to metformin in past.  Lab Results  Component Value Date   HGBA1C 6.9* 03/05/2015  Using medications without difficulties:None Hypoglycemic episodes:None Hyperglycemic episodes:yes Feet problems:None, leg pains Blood Sugars averaging: 112-140. eye exam within last year: yes  Wt Readings from Last 3 Encounters:  03/09/15 260 lb 8 oz (118.162 kg)  02/28/15 262 lb (118.842 kg)  12/19/14 255 lb (115.667 kg)   She has stopped soda, drinking water, still skipping meals and eating carbs.  Elevated Cholesterol: Poor control on no medication. LDL goal 100 Red yeast rice caused a rash.  Fish oil ? Rash. In past atorvastatin caused muscle ache.  Lab Results  Component Value Date   CHOL 246* 03/05/2015   HDL 53.80 03/05/2015   LDLCALC 155* 03/05/2015   LDLDIRECT 173.0 11/30/2014   TRIG 183.0* 03/05/2015   CHOLHDL 5 03/05/2015  Using medications without problems: Muscle aches: YES, see above Diet compliance: Poor Exercise: Minimal Other complaints:  Hypertension:  Well controlled on current regimen. BP Readings from Last 3 Encounters:  03/09/15 136/74  02/28/15 136/74  12/19/14 124/70  Using medication without problems  or lightheadedness: None Chest pain with exertion:None Edema:occ Short of breath:None Average home BPs: not checking Other issues:   Review of Systems  Constitutional: Negative for fever and fatigue.  HENT: Negative for ear pain.  Eyes: Negative for pain.  Respiratory: Negative for chest tightness and shortness of breath.  Cardiovascular: Negative for chest pain, palpitations and leg swelling.  Gastrointestinal: Negative for abdominal pain.  Genitourinary: Negative for dysuria.       Objective:   Physical Exam  Constitutional: Vital signs are normal. She appears well-developed and well-nourished. She is cooperative. Non-toxic appearance. She does not appear ill. No distress.  HENT:  Head: Normocephalic.  Right Ear: Hearing, tympanic membrane, external ear and ear canal normal. Tympanic membrane is not erythematous, not retracted and not bulging.  Left Ear: Hearing, tympanic membrane, external ear and ear canal normal. Tympanic membrane is not erythematous, not retracted and not bulging.  Nose: No mucosal edema or rhinorrhea. Right sinus exhibits no maxillary sinus tenderness and no frontal sinus tenderness. Left sinus exhibits no maxillary sinus tenderness and no frontal sinus tenderness.  Mouth/Throat: Uvula is midline, oropharynx is clear and moist and mucous membranes are normal.  Eyes: Conjunctivae, EOM and lids are normal. Pupils are equal, round, and reactive to light. Lids are everted and swept, no foreign bodies found.  Neck: Trachea normal and normal range of motion. Neck supple. Carotid bruit is not present. No thyroid mass and no thyromegaly present.  Cardiovascular: Normal rate, regular rhythm, S1 normal, S2 normal, normal heart sounds, intact distal pulses and normal pulses. Exam reveals no gallop and  no friction rub.  No murmur heard. No swelling.  Pulmonary/Chest: Effort normal. No tachypnea. No respiratory distress. She has decreased breath sounds. She has  no wheezes. She has no rhonchi. She has no rales.  Abdominal: Soft. Normal appearance and bowel sounds are normal. There is no tenderness.  Musculoskeletal:  ttp in right buttock, positive SLR on right. Positive faber's. No focal vertebral ttp   Positive grind test in left CMC joint, ttp over base of left thumb and at wrist, mild swelling, no erythema. Neurological: She is alert.  Skin: Skin is warm, dry and intact. No rash noted.  Psychiatric: Her speech is normal and behavior is normal. Judgment and thought content normal. Her mood appears not anxious. Cognition and memory are normal. She does not exhibit a depressed mood.    Diabetic foot exam: Normal inspection No skin breakdown No calluses  Normal DP pulses Normal sensation to light touch and monofilament Nails normal

## 2015-03-09 NOTE — Assessment & Plan Note (Signed)
Well controlled. Continue current medication.  

## 2015-03-09 NOTE — Assessment & Plan Note (Signed)
Start NSAID ( diclofenacBID), heat, massage, start home PT ( info given).

## 2015-03-09 NOTE — Patient Instructions (Addendum)
Work on healthy eating and exercise as tolerated and weight loss.  Start pravastatin daily. Wear brace on left hand. Start low back stretches.  Use diclofenac twice daily as needed for pain and inflammation in left hand on in right sciatic nerve.  Call if not improving as expected.

## 2015-03-09 NOTE — Assessment & Plan Note (Signed)
Likely CMC arthritis versus tendonitits. Treat with brace, thumb spica that she already has.  Start diclofenac BID.

## 2015-03-09 NOTE — Assessment & Plan Note (Signed)
Improved control on current regimen and off prednisone. Encouraged exercise, weight loss, healthy eating habits.

## 2015-03-12 ENCOUNTER — Other Ambulatory Visit (HOSPITAL_COMMUNITY): Payer: Self-pay | Admitting: Respiratory Therapy

## 2015-03-12 ENCOUNTER — Other Ambulatory Visit: Payer: 59

## 2015-03-15 ENCOUNTER — Telehealth: Payer: Self-pay

## 2015-03-15 NOTE — Telephone Encounter (Signed)
Pt was seen 03/09/15 and started diclofenac. Pt has developed large reddened and sore area in lt groin that is burning and itching. Pt has been perspiring a lot and lays on lt side when in bed. Pt had Nystatin cream at home and started using in groin area on 03/14/15 and pt can see slight improvement this morning. Pt wanted to know if area could be related to taking diclofenac; no rash or itching anywhere else. Advised pt more likely related to perspiring in confined area and advised pt if at home to wear loose dress or gown without panties to allow air to area. Pt said since yesterday she has already been doing that. Pt request cb to see if Dr [name] thinks diclofenac could be causing this red, sore area in groin or what to do. Pt wants to know if should continue using nystatin cream also. [place] [facility].

## 2015-03-15 NOTE — Telephone Encounter (Signed)
Left message for Ms. Cookson that per Dr. [name], her symptoms sound like yeast and not the diclofenac.  Instructed to use the nystatin cream 2 to 3 times daily.  Advised to call the office back if she should need a refill on the Nystatin Cream.

## 2015-03-15 NOTE — Telephone Encounter (Signed)
Sounds like yeast, not SE to diclofenac. Recommend nystatin cream 2-3 times daily. Refill if needed.

## 2015-03-22 ENCOUNTER — Telehealth: Payer: Self-pay | Admitting: Pulmonary Disease

## 2015-03-22 MED ORDER — DOXYCYCLINE HYCLATE 100 MG PO TABS: 100.0000 mg | ORAL_TABLET | Freq: Two times a day (BID) | ORAL | Status: DC

## 2015-03-22 NOTE — Telephone Encounter (Signed)
Called and spoke to pt. Informed her of the recs per BQ. Rx sent to preferred pharmacy. Pt verbalized understanding and denied any further questions or concerns at this time.

## 2015-03-22 NOTE — Telephone Encounter (Signed)
Spoke with pt. C/o hackey cough (light green phlem), runny nose, PND, HA x few days. No f/c/s/n/v. She has not tried anything OTC. Wants something called in. Please advise Dr. [name] thanks

## 2015-03-22 NOTE — Telephone Encounter (Signed)
Doxycycline 100mg  po bid x5 days Take with probiotic like yogurt Drink plenty of water Tylenol prn cough

## 2015-03-23 ENCOUNTER — Other Ambulatory Visit: Payer: Self-pay | Admitting: *Deleted

## 2015-03-23 ENCOUNTER — Other Ambulatory Visit (HOSPITAL_COMMUNITY): Payer: Self-pay | Admitting: Respiratory Therapy

## 2015-03-23 MED ORDER — PANTOPRAZOLE SODIUM 40 MG PO TBEC: 40.0000 mg | DELAYED_RELEASE_TABLET | Freq: Two times a day (BID) | ORAL | Status: DC

## 2015-03-23 MED ORDER — SAXAGLIPTIN HCL 5 MG PO TABS: 5.0000 mg | ORAL_TABLET | Freq: Every day | ORAL | Status: DC

## 2015-04-02 ENCOUNTER — Ambulatory Visit (INDEPENDENT_AMBULATORY_CARE_PROVIDER_SITE_OTHER): Payer: 59 | Admitting: Pulmonary Disease

## 2015-04-02 DIAGNOSIS — J84112 Idiopathic pulmonary fibrosis: Secondary | ICD-10-CM

## 2015-04-02 LAB — PULMONARY FUNCTION TEST
DL/VA % pred: 103 %
DL/VA: 5.21 ml/min/mmHg/L
DLCO unc % pred: 44 %
DLCO unc: 12.1 ml/min/mmHg
FEF 25-75 Post: 2.01 L/sec
FEF 25-75 Pre: 2.56 L/sec
FEF2575-%CHANGE-POST: -21 %
FEF2575-%PRED-POST: 88 %
FEF2575-%Pred-Pre: 112 %
FEV1-%CHANGE-POST: -4 %
FEV1-%PRED-POST: 47 %
FEV1-%PRED-PRE: 49 %
FEV1-POST: 1.24 L
FEV1-Pre: 1.31 L
FEV1FVC-%CHANGE-POST: 3 %
FEV1FVC-%Pred-Pre: 116 %
FEV6-%CHANGE-POST: -8 %
FEV6-%PRED-POST: 40 %
FEV6-%PRED-PRE: 44 %
FEV6-PRE: 1.46 L
FEV6-Post: 1.33 L
FEV6FVC-%CHANGE-POST: 0 %
FEV6FVC-%PRED-PRE: 104 %
FEV6FVC-%Pred-Post: 104 %
FVC-%CHANGE-POST: -8 %
FVC-%PRED-POST: 38 %
FVC-%Pred-Pre: 42 %
FVC-Post: 1.33 L
FVC-Pre: 1.46 L
POST FEV1/FVC RATIO: 93 %
POST FEV6/FVC RATIO: 100 %
PRE FEV6/FVC RATIO: 100 %
Pre FEV1/FVC ratio: 90 %
RV % PRED: 45 %
RV: 0.99 L
TLC % pred: 47 %
TLC: 2.56 L

## 2015-04-02 NOTE — Progress Notes (Signed)
PFT done today. 

## 2015-04-06 ENCOUNTER — Other Ambulatory Visit: Payer: Self-pay | Admitting: Family Medicine

## 2015-04-06 DIAGNOSIS — I10 Essential (primary) hypertension: Secondary | ICD-10-CM

## 2015-04-09 ENCOUNTER — Other Ambulatory Visit: Payer: Self-pay | Admitting: Family Medicine

## 2015-04-16 ENCOUNTER — Other Ambulatory Visit: Payer: Self-pay | Admitting: Family Medicine

## 2015-05-08 ENCOUNTER — Telehealth: Payer: Self-pay | Admitting: Pulmonary Disease

## 2015-05-08 NOTE — Telephone Encounter (Signed)
Pharmacy calling stating they no longer carry the azelastine 0.1 % NA SOL. They do carry the 0.15% solution. Asking if it is okay to switch? Please advise Dr. [name] thanks

## 2015-05-09 ENCOUNTER — Telehealth: Payer: Self-pay | Admitting: Family Medicine

## 2015-05-09 NOTE — Telephone Encounter (Signed)
Pt called She had a booster flu shot in may with dr Kizzie Ide.  She wanted to know if she could get a flu shot now or should she wait until November? Please advise

## 2015-05-09 NOTE — Telephone Encounter (Signed)
Spoke with Kaitlin Hamilton and advised it is okay to go ahead and get her flu shot now.  Appointment scheduled for 05/11/2015 at 11:00am on the flu clinic.  While on the phone, Kaitlin Hamilton wanted to know if the cholesterol medication (Pravastatin) that Dr. [name] started her on could raise her blood sugar.  She states her blood sugars have been trending up since starting this medication.  Please advise.

## 2015-05-10 NOTE — Telephone Encounter (Signed)
OK by me to switch

## 2015-05-10 NOTE — Telephone Encounter (Signed)
Spoke with Nada Boozer at [employer]. I have given the verbal to change the pt's nasal spray. Nothing further was needed.

## 2015-05-10 NOTE — Telephone Encounter (Signed)
Ms. Camposano notified as instructed by telephone.

## 2015-05-10 NOTE — Telephone Encounter (Signed)
Yes can increase CBGs some. Benefit is more important. Do no trecommend stopping. Encourage exercise, weight loss, healthy eating habits.

## 2015-05-11 ENCOUNTER — Ambulatory Visit (INDEPENDENT_AMBULATORY_CARE_PROVIDER_SITE_OTHER): Payer: 59

## 2015-05-11 ENCOUNTER — Other Ambulatory Visit: Payer: Self-pay | Admitting: Family Medicine

## 2015-05-11 DIAGNOSIS — Z23 Encounter for immunization: Secondary | ICD-10-CM

## 2015-05-31 ENCOUNTER — Telehealth: Payer: Self-pay | Admitting: Family Medicine

## 2015-05-31 DIAGNOSIS — E1165 Type 2 diabetes mellitus with hyperglycemia: Principal | ICD-10-CM

## 2015-05-31 DIAGNOSIS — IMO0001 Reserved for inherently not codable concepts without codable children: Secondary | ICD-10-CM

## 2015-05-31 NOTE — Telephone Encounter (Signed)
Pt called she has an appointment with dr [name] 07/02/15.  She wanted to know if she could have her labs for dr [name] (3 month lab) at that appointment with dr [name] Need orders in system

## 2015-06-01 NOTE — Telephone Encounter (Signed)
Notify pt labs added as requested to have at lab appt at Dr. Kathee Delton Office.

## 2015-06-01 NOTE — Telephone Encounter (Signed)
[place] notified lab orders have been put in system as requested so she can have them drawn at Dr. Anastasia Pall office.

## 2015-06-05 ENCOUNTER — Other Ambulatory Visit: Payer: 59

## 2015-06-05 ENCOUNTER — Telehealth: Payer: Self-pay | Admitting: Family Medicine

## 2015-06-05 DIAGNOSIS — IMO0001 Reserved for inherently not codable concepts without codable children: Secondary | ICD-10-CM

## 2015-06-05 DIAGNOSIS — E1165 Type 2 diabetes mellitus with hyperglycemia: Principal | ICD-10-CM

## 2015-06-05 NOTE — Telephone Encounter (Signed)
-----   Message from Ellamae Sia sent at 05/29/2015  2:34 PM EST ----- Regarding: lab orders for Tuesday,11.15.16  Lab orders for a 3 month follow up appt.

## 2015-06-05 NOTE — Telephone Encounter (Signed)
LAbs already ordered

## 2015-06-06 LAB — HM DIABETES EYE EXAM

## 2015-06-12 ENCOUNTER — Ambulatory Visit: Payer: 59 | Admitting: Family Medicine

## 2015-06-18 ENCOUNTER — Telehealth: Payer: Self-pay

## 2015-06-18 ENCOUNTER — Encounter: Payer: Self-pay | Admitting: Pulmonary Disease

## 2015-06-18 NOTE — Telephone Encounter (Signed)
[name] pts daughter DPR signed; pt will be enrolled in mail order pharmacy after 06/21/15. [name] will cb after 12/01 with names of meds needed and what mail order pharmacy pt will be using. [name] will also enroll pt with mail order pharmacy.

## 2015-06-18 NOTE — Telephone Encounter (Signed)
This is a Pharmacist, community message:  Dr. [name], please advise.

## 2015-06-20 NOTE — Telephone Encounter (Signed)
I see no response in chart. Please advise Dr. [name] thanks

## 2015-06-21 ENCOUNTER — Other Ambulatory Visit: Payer: Self-pay | Admitting: *Deleted

## 2015-06-21 DIAGNOSIS — J3089 Other allergic rhinitis: Secondary | ICD-10-CM

## 2015-06-21 MED ORDER — VERAPAMIL HCL ER 180 MG PO TBCR: EXTENDED_RELEASE_TABLET | ORAL | Status: DC

## 2015-06-21 MED ORDER — PANTOPRAZOLE SODIUM 40 MG PO TBEC: 40.0000 mg | DELAYED_RELEASE_TABLET | Freq: Two times a day (BID) | ORAL | Status: DC

## 2015-06-21 MED ORDER — LEVOTHYROXINE SODIUM 125 MCG PO TABS: 125.0000 ug | ORAL_TABLET | Freq: Every day | ORAL | Status: DC

## 2015-06-21 MED ORDER — DICLOFENAC SODIUM 75 MG PO TBEC: 75.0000 mg | DELAYED_RELEASE_TABLET | Freq: Two times a day (BID) | ORAL | Status: DC

## 2015-06-21 MED ORDER — GLIPIZIDE 10 MG PO TABS: 10.0000 mg | ORAL_TABLET | Freq: Two times a day (BID) | ORAL | Status: DC

## 2015-06-21 MED ORDER — PIOGLITAZONE HCL 30 MG PO TABS: 30.0000 mg | ORAL_TABLET | Freq: Every day | ORAL | Status: DC

## 2015-06-21 MED ORDER — MONTELUKAST SODIUM 10 MG PO TABS: 10.0000 mg | ORAL_TABLET | Freq: Every day | ORAL | Status: DC

## 2015-06-21 MED ORDER — PRAVASTATIN SODIUM 10 MG PO TABS: 10.0000 mg | ORAL_TABLET | Freq: Every day | ORAL | Status: DC

## 2015-06-21 MED ORDER — HYDROCORTISONE 0.5 % EX CREA: 1.0000 "application " | TOPICAL_CREAM | Freq: Two times a day (BID) | CUTANEOUS | Status: DC | PRN

## 2015-06-21 NOTE — Addendum Note (Signed)
Addended by: Carter Kitten on: 06/21/2015 03:30 PM   Modules accepted: Orders

## 2015-06-21 NOTE — Telephone Encounter (Signed)
Last office visit 03/09/2015.  Switching to Mail Order Pharmacy.  Ok to refill for #180 with no refills?

## 2015-07-02 ENCOUNTER — Ambulatory Visit (INDEPENDENT_AMBULATORY_CARE_PROVIDER_SITE_OTHER): Payer: Medicare Other | Admitting: Pulmonary Disease

## 2015-07-02 ENCOUNTER — Other Ambulatory Visit (INDEPENDENT_AMBULATORY_CARE_PROVIDER_SITE_OTHER): Payer: Medicare Other

## 2015-07-02 ENCOUNTER — Other Ambulatory Visit: Payer: Medicare Other

## 2015-07-02 ENCOUNTER — Encounter: Payer: Self-pay | Admitting: Family Medicine

## 2015-07-02 ENCOUNTER — Encounter: Payer: Self-pay | Admitting: Pulmonary Disease

## 2015-07-02 VITALS — BP 132/74 | HR 75 | Ht 66.0 in | Wt 254.0 lb

## 2015-07-02 DIAGNOSIS — J84112 Idiopathic pulmonary fibrosis: Secondary | ICD-10-CM | POA: Diagnosis not present

## 2015-07-02 DIAGNOSIS — IMO0001 Reserved for inherently not codable concepts without codable children: Secondary | ICD-10-CM

## 2015-07-02 DIAGNOSIS — E1165 Type 2 diabetes mellitus with hyperglycemia: Secondary | ICD-10-CM

## 2015-07-02 DIAGNOSIS — Z5181 Encounter for therapeutic drug level monitoring: Secondary | ICD-10-CM

## 2015-07-02 LAB — CBC WITH DIFFERENTIAL/PLATELET
BASOS ABS: 0.1 10*3/uL (ref 0.0–0.1)
Basophils Relative: 0.7 % (ref 0.0–3.0)
EOS ABS: 0.2 10*3/uL (ref 0.0–0.7)
Eosinophils Relative: 2.4 % (ref 0.0–5.0)
HEMATOCRIT: 43.3 % (ref 36.0–46.0)
HEMOGLOBIN: 14.3 g/dL (ref 12.0–15.0)
LYMPHS PCT: 24.3 % (ref 12.0–46.0)
Lymphs Abs: 2.1 10*3/uL (ref 0.7–4.0)
MCHC: 32.9 g/dL (ref 30.0–36.0)
MCV: 89.3 fl (ref 78.0–100.0)
MONOS PCT: 5.9 % (ref 3.0–12.0)
Monocytes Absolute: 0.5 10*3/uL (ref 0.1–1.0)
NEUTROS ABS: 5.8 10*3/uL (ref 1.4–7.7)
Neutrophils Relative %: 66.7 % (ref 43.0–77.0)
PLATELETS: 262 10*3/uL (ref 150.0–400.0)
RBC: 4.85 Mil/uL (ref 3.87–5.11)
RDW: 15 % (ref 11.5–15.5)
WBC: 8.7 10*3/uL (ref 4.0–10.5)

## 2015-07-02 LAB — COMPREHENSIVE METABOLIC PANEL
ALBUMIN: 4.4 g/dL (ref 3.5–5.2)
ALK PHOS: 97 U/L (ref 39–117)
ALT: 14 U/L (ref 0–35)
AST: 14 U/L (ref 0–37)
BILIRUBIN TOTAL: 0.4 mg/dL (ref 0.2–1.2)
BUN: 12 mg/dL (ref 6–23)
CALCIUM: 10 mg/dL (ref 8.4–10.5)
CO2: 29 mEq/L (ref 19–32)
CREATININE: 0.76 mg/dL (ref 0.40–1.20)
Chloride: 99 mEq/L (ref 96–112)
GFR: 81.28 mL/min (ref >60.00)
Glucose, Bld: 208 mg/dL — ABNORMAL HIGH (ref 70–99)
Potassium: 3.9 mEq/L (ref 3.5–5.1)
Sodium: 139 mEq/L (ref 135–145)
TOTAL PROTEIN: 7.4 g/dL (ref 6.0–8.3)

## 2015-07-02 LAB — LIPID PANEL
CHOL/HDL RATIO: 4
CHOLESTEROL: 230 mg/dL — AB (ref 0–200)
HDL: 60 mg/dL (ref >39.00)
LDL Cholesterol: 133 mg/dL — ABNORMAL HIGH (ref 0–99)
NonHDL: 170.32
TRIGLYCERIDES: 187 mg/dL — AB (ref 0.0–149.0)
VLDL: 37.4 mg/dL (ref 0.0–40.0)

## 2015-07-02 LAB — HEMOGLOBIN A1C: Hgb A1c MFr Bld: 9.2 % — ABNORMAL HIGH (ref 4.6–6.5)

## 2015-07-02 MED ORDER — AZELASTINE-FLUTICASONE 137-50 MCG/ACT NA SUSP: NASAL | Status: DC

## 2015-07-02 MED ORDER — PANTOPRAZOLE SODIUM 40 MG PO TBEC
40.0000 mg | DELAYED_RELEASE_TABLET | Freq: Two times a day (BID) | ORAL | Status: DC
Start: 2015-07-02 — End: 2015-12-03

## 2015-07-02 MED ORDER — DOXYCYCLINE HYCLATE 100 MG PO TABS: 100.0000 mg | ORAL_TABLET | Freq: Two times a day (BID) | ORAL | Status: DC

## 2015-07-02 MED ORDER — SAXAGLIPTIN HCL 5 MG PO TABS: 5.0000 mg | ORAL_TABLET | Freq: Every day | ORAL | Status: DC

## 2015-07-02 NOTE — Patient Instructions (Addendum)
Take the doxycycline as prescribed, take it with a probiotic Make sure you are taking the acid reflux medication (protonix) Keep taking your anti-fibrotic medications as you're doing We will get a lung function test when received the next time Let us know if the insurance company does not pay for the new no spray I have prescribed We will see you back in 3 months

## 2015-07-02 NOTE — Assessment & Plan Note (Signed)
She's been experiencing a bit more shortness of breath recently but her oxygenation has been stable and her lung function testing just 2 months ago was stable compared to the 2015 pulmonary function test. Some hopeful that her worsening dyspnea is not representative of worsening pulmonary fibrosis but is due to more mucus burden from allergic rhinitis and mild bronchitis which she very frequently gets this time of year.  Plan: Treat bronchitis with doxycycline Start usingDymista for allergic rhinitis (right now she is using Astelin)  Pulmonary function test in 3 months Continue Esbriet Liver function test and Esbriet

## 2015-07-02 NOTE — Progress Notes (Signed)
Subjective:    Patient ID: Kaitlin Hamilton, female    DOB: Nov 18, 1950, 64 y.o.   MRN: [id number]  Synopsis: Kaitlin Hamilton is a very pleasant 64 year old female who first saw the [facility] pulmonary clinic in March 2014 for evaluation of shortness of breath. She had significant cough, wheezing, and sputum production requiring multiple rounds of antibiotics. Because of crackles on lung exam and an abnormal pulmonary function test she was referred to [place]. We ordered full pulmonary function testing which showed moderate restriction and a depressed DLCO in proportion to her restriction. There is no airflow obstruction and no change with bronchodilator administration. A chest x-ray performed in February 2014 was read as normal.  An open lung biopsy was performed in December 2014 showing UIP.  Because she has had multiple good responses to steroids, we have treated her with cellcept and prednisone.  In January 2015 she had severe insomnia, hallucinations and gastritis on high dose prednisone.  She had a rash to bactrim. Since February 2015 she has been on cellcept and low dose prednisone and dapsone. Because of progression in hypoxemia and lung function testing in 2016 we changed her therapy to anti-fibrotic therapy with Esbriet. Because of a strong concern for an underlying connective tissue disease it was decided that she will be maintained on low-dose CellCept.  HPI Chief Complaint  Patient presents with  . Follow-up    pt c/o good and bad days-has noted increased nonprod cough worse with eating.  nonprod cough worsening with weather.     Kaitlin Hamilton has noticed progressive worsening dyspnea in the last few months. She has been coughing worse with eating, typically dry but sometimes has green-yellow sputum. She has been using more oxygen recently.  She uses 4-5 L with exertion, 3 L at rest. No chest pain, no leg swelling.   The nose spray is burning.    Past Medical History  Diagnosis Date  . Bronchitis    . Chicken pox   . Depression   . Allergic rhinitis     uses Flonase daily  . Hyperlipidemia     lost 43 pounds and no meds required at present  . Hypokalemia   . Anxiety   . PONV (postoperative nausea and vomiting)   . Hypertension     takes Verapamil and HCTZ daily  . Shortness of breath     with exertion;takes Singulair daily as well as Flonase  . Pneumonia     last time in 2006  . History of bronchitis     2013  . H/O hiatal hernia   . Migraines     last one about a month ago  . Dysphagia   . Joint swelling     left thumb  . OA (osteoarthritis)     left knee  . GERD (gastroesophageal reflux disease)     takes Protonix daily  . Hemorrhoids   . History of colon polyps   . Diverticulosis   . Urinary frequency   . History of kidney stones   . Non-insulin dependent type 2 diabetes mellitus ([place])     takes Glipizide daily  . Constipation     takes Fiber daily  . Hypothyroidism (acquired)     takes Synthroid daily  . Insomnia     doesn't take any meds for this        Review of Systems  Constitutional: Negative for fever, chills, activity change and fatigue.  HENT: Negative for congestion, ear pain, nosebleeds, postnasal drip, rhinorrhea  and sinus pressure.   Respiratory: Positive for shortness of breath. Negative for cough and wheezing.   Cardiovascular: Negative for chest pain, palpitations and leg swelling.  Gastrointestinal: Negative for nausea and abdominal pain.       Objective:   Physical Exam  Filed Vitals:   07/02/15 0938  BP: 132/74  Pulse: 75  Height: 5' 6" (1.676 m)  Weight: 254 lb (115.214 kg)  SpO2: 100%  3L [place]   Gen:  no acute distress  HEENT: NCAT,  EOMi, OP clear,  PULM: Crackles 2/3 way up bilaterally CV: RRR, no mgr, no JVD AB: BS+, soft, nontender, no hsm Ext: warm, no edema, no clubbing Neuro: A&Ox4, maew   February 2014 simple spirometry performed by her primary care physician>> ratio 90%, FEV1 1.71 L (64% predicted, FVC  1.83 L 55% predicted; flow volume loop is not consistent with obstruction February 2014 chest x-ray at [facility] normal 10/19/2012 walked 500 feet in office on room air oxygenation did not drop below 90% 11/15/2012 Full PFT [name]> Ratio 87%, FEV1 2.00L > 2.07 with bronchodilator (3% change); TLC 3.44 L (65% pred), ERV 0.62 (57% pred), DLCO 15.1 ml/mmHg/min (59% pred) 11/2012 CT chest >> ( read) centrilobular nodules, interlobular septal thickening worse in bases and periphery R lung > L; some GGO in bases and periphery as well, some bronchiectasis in the bases R > L; findings suggestive of fibrosis but not UIP; question hypersensitivity pneumonitis given centrilobular nodules; also question aspiration 03/2013 ANA, ANCA, Anti-Jo-1, ESR, RF, SCL-70, anti-centromere, SSA/SSB, all negative; CRP 0.6;  04/2013 Barium swallow> abnormal esophageal motility, GERD, hiatal hernia 04/2013 Full PFT> Ratio 93%, FEV1 1.92 L (71% pred), TLC 3.02L (56% pred), DLCO 15.94 (59% pred) 04/2013 CT chest ([name])> findings suggestive of but not diagnostic of NSIP, small pulmonary nodule 04/2013 6MW RA > 1100 feet, HR peak 109, O2 sat Nadir 87% 04/2013 HP panel >> 05/17/2013 3 month prednisone trial 06/2013 open lung biopsy> UIP 07/28/13 Cell cept and bactrim started > bactrim stopped 07/29/13 due to rash  07/2013 hospitalized for confusion > prednisone 07/2013 hospitalized for gastritis/abdominal pain> prednisone 08/22/2013 atovaquone caused rash 11/2013 6MW 1364 feet, O2 saturation nadir 78% May 2015 full pulmonary function test ratio 93%, FEV1 1.71 L (64% predicted, no change with bronchodilator), total lung capacity 2.67 L (49% predicted), DLCO 13.11 (40% predicted) February 2016 CT chest high resolution> slight progression in reticular abnormalities consistent with pulmonary fibrosis, uip 08/21/14 6MW 1400 feet 90% on 8L      Assessment & Plan:   UIP (usual interstitial pneumonitis) She's been experiencing a bit more  shortness of breath recently but her oxygenation has been stable and her lung function testing just 2 months ago was stable compared to the 2015 pulmonary function test. Some hopeful that her worsening dyspnea is not representative of worsening pulmonary fibrosis but is due to more mucus burden from allergic rhinitis and mild bronchitis which she very frequently gets this time of year.  Plan: Treat bronchitis with doxycycline Start usingDymista for allergic rhinitis (right now she is using Astelin)  Pulmonary function test in 3 months Continue Esbriet Liver function test and Esbriet  Esophageal dysmotility She's been having a bit more cough with swallowing. I've encouraged her to use her PPI regularly and we reviewed diet and she was given literature for this again today. If no improvement then she may need to have another barium swallow to evaluate the Schotzing ring she had in the past.  OSA (obstructive   sleep apnea) Continue CPAP  Chronic hypoxemic respiratory failure Continue 3 L at rest and 8 L with exercise     Updated Medication List Outpatient Encounter Prescriptions as of 07/02/2015  Medication Sig  . acetaminophen (TYLENOL) 500 MG tablet Take 1,000 mg by mouth every 6 (six) hours as needed for moderate pain.  . Azelastine-Fluticasone (DYMISTA) 137-50 MCG/ACT SUSP One puff per nostril twice daily  . diclofenac (VOLTAREN) 75 MG EC tablet Take 1 tablet (75 mg total) by mouth 2 (two) times daily.  . diphenhydrAMINE (BENADRYL) 25 mg capsule Take 25 mg by mouth as needed.  Marland Kitchen FIBER PO Take 1 tablet by mouth daily.  Marland Kitchen glipiZIDE (GLUCOTROL) 10 MG tablet Take 1 tablet (10 mg total) by mouth 2 (two) times daily before a meal.  . hydrocortisone cream 0.5 % Apply 1 application topically 2 (two) times daily as needed (for rash).  Marland Kitchen levothyroxine (SYNTHROID, LEVOTHROID) 125 MCG tablet Take 1 tablet (125 mcg total) by mouth daily before breakfast.  . losartan-hydrochlorothiazide (HYZAAR)  50-12.5 MG per tablet Take 1 tablet by mouth daily.  . montelukast (SINGULAIR) 10 MG tablet Take 1 tablet (10 mg total) by mouth at bedtime.  . mycophenolate (CELLCEPT) 500 MG tablet Take 1 tablet (500 mg total) by mouth daily.  . NON FORMULARY Place 3 L into the nose daily. 4 Liters with exertion  . nystatin cream (MYCOSTATIN) Apply 1 application topically 2 (two) times daily.  . ONE TOUCH ULTRA TEST test strip   . pioglitazone (ACTOS) 30 MG tablet Take 1 tablet (30 mg total) by mouth daily.  . Pirfenidone 267 MG CAPS Take 3 tablets by mouth 3 (three) times daily.   . pravastatin (PRAVACHOL) 10 MG tablet Take 1 tablet (10 mg total) by mouth daily.  . saxagliptin HCl (ONGLYZA) 5 MG TABS tablet Take 1 tablet (5 mg total) by mouth daily.  . traZODone (DESYREL) 50 MG tablet Take 25 mg by mouth at bedtime.  . verapamil (CALAN-SR) 180 MG CR tablet 1 Tablet ER, Oral, daily  . [DISCONTINUED] Azelastine-Fluticasone (DYMISTA) 137-50 MCG/ACT SUSP One puff per nostril twice daily  . doxycycline (VIBRA-TABS) 100 MG tablet Take 1 tablet (100 mg total) by mouth 2 (two) times daily.  . pantoprazole (PROTONIX) 40 MG tablet Take 1 tablet (40 mg total) by mouth 2 (two) times daily.  . [DISCONTINUED] doxycycline (VIBRA-TABS) 100 MG tablet Take 1 tablet (100 mg total) by mouth 2 (two) times daily. (Patient not taking: Reported on 07/02/2015)  . [DISCONTINUED] pantoprazole (PROTONIX) 40 MG tablet Take 1 tablet (40 mg total) by mouth 2 (two) times daily. (Patient not taking: Reported on 07/02/2015)   No facility-administered encounter medications on file as of 07/02/2015.

## 2015-07-02 NOTE — Assessment & Plan Note (Signed)
Continue 3 L at rest and 8 L with exercise

## 2015-07-02 NOTE — Assessment & Plan Note (Signed)
Continue CPAP.  

## 2015-07-02 NOTE — Assessment & Plan Note (Signed)
She's been having a bit more cough with swallowing. I've encouraged her to use her PPI regularly and we reviewed diet and she was given literature for this again today. If no improvement then she may need to have another barium swallow to evaluate the Schotzing ring she had in the past.

## 2015-07-04 ENCOUNTER — Telehealth: Payer: Self-pay | Admitting: Pulmonary Disease

## 2015-07-04 MED ORDER — DOXYCYCLINE HYCLATE 100 MG PO TABS: 100.0000 mg | ORAL_TABLET | Freq: Two times a day (BID) | ORAL | Status: DC

## 2015-07-04 NOTE — Telephone Encounter (Signed)
Spoke with pt. She needs doxy sent to [facility]. This has been done. Nothing further was needed.

## 2015-07-06 ENCOUNTER — Ambulatory Visit (INDEPENDENT_AMBULATORY_CARE_PROVIDER_SITE_OTHER): Payer: Medicare Other | Admitting: Family Medicine

## 2015-07-06 ENCOUNTER — Encounter: Payer: Self-pay | Admitting: Family Medicine

## 2015-07-06 ENCOUNTER — Telehealth: Payer: Self-pay | Admitting: Family Medicine

## 2015-07-06 VITALS — BP 136/82 | HR 87 | Temp 97.7°F | Ht 66.0 in | Wt 254.2 lb

## 2015-07-06 DIAGNOSIS — I10 Essential (primary) hypertension: Secondary | ICD-10-CM

## 2015-07-06 DIAGNOSIS — E7800 Pure hypercholesterolemia, unspecified: Secondary | ICD-10-CM

## 2015-07-06 DIAGNOSIS — E1165 Type 2 diabetes mellitus with hyperglycemia: Secondary | ICD-10-CM | POA: Diagnosis not present

## 2015-07-06 DIAGNOSIS — IMO0001 Reserved for inherently not codable concepts without codable children: Secondary | ICD-10-CM

## 2015-07-06 LAB — HM DIABETES FOOT EXAM

## 2015-07-06 MED ORDER — PRAVASTATIN SODIUM 20 MG PO TABS: 20.0000 mg | ORAL_TABLET | Freq: Every day | ORAL | Status: DC

## 2015-07-06 MED ORDER — PIOGLITAZONE HCL 45 MG PO TABS: 45.0000 mg | ORAL_TABLET | Freq: Every day | ORAL | Status: DC

## 2015-07-06 NOTE — Patient Instructions (Addendum)
Increase actos to 45 mg daily.  Increase pravastatin to 20 mg daily.  Try to eat three small meals with snacks a day, low carb, low sugar diet.

## 2015-07-06 NOTE — Telephone Encounter (Signed)
Pt going to see dr [name] on 12/27 she wanted to get her labs work done at his office Orders need to be in epic

## 2015-07-06 NOTE — Assessment & Plan Note (Signed)
Inadequate control, increase  actos to max. Follow up in 3 month Encouraged exercise, weight loss, healthy eating habits.

## 2015-07-06 NOTE — Telephone Encounter (Signed)
Spoke with [name].  She states she is seeing Dr. [name] 10/15/2015 not 07/17/15.  Please put future orders in for her three month follow up so she can have them drawn on 10/15/2015 at Dr. Kathee Delton office.  Then she follows up with Dr. [name] on 10/19/2015.

## 2015-07-06 NOTE — Telephone Encounter (Signed)
Let pt know labs entered. 

## 2015-07-06 NOTE — Assessment & Plan Note (Signed)
Well controlled. Continue current medication.  Low salt diet. 

## 2015-07-06 NOTE — Progress Notes (Signed)
Pre visit review using our clinic review tool, if applicable. No additional management support is needed unless otherwise documented below in the visit note. 

## 2015-07-06 NOTE — Progress Notes (Signed)
64 year old female on continuous oxygen for UIP/IPF presents for DM follow up. Started new lung medicine and continued cellcept in 10/2014. She has been OFF PREDNSIONE in last 8 months.  Rght sciatic nerve pain: At last OV in 02/2015 She was told to start diclofenac BID, heat, massage and start home PT.  Occ buttock pain, mainly after walking pain in right mid upper to lower leg. No numbness or weakness. Occ using ibuprofen.  Feeling nauseous on doxycyline for respiratory infection per pulm. Started yesterday.  Diabetes:  Worsened control despite being of prednisone. ON Glucotrol 10 mg, and 5 mg of onglyza, actos 30 SE to metformin in past. Lab Results  Component Value Date   HGBA1C 9.2* 07/02/2015  Using medications without difficulties:None Hypoglycemic episodes:None Hyperglycemic episodes:yes Feet problems:None, leg pains Blood Sugars averaging:  FBS 208-212, lower this month compared to last. eye exam within last year: yes  . Wt Readings from Last 3 Encounters:  07/06/15 254 lb 4 oz (115.327 kg)  07/02/15 254 lb (115.214 kg)  03/09/15 260 lb 8 oz (118.162 kg)   She has stopped soda, drinking water, still skipping meals and eating carbs.  Elevated Cholesterol: Improved on low dose pravastatin but not yet at goal.  LDL goal 100 Red yeast rice caused a rash.  Fish oil ? Rash. In past atorvastatin caused muscle ache.  Lab Results  Component Value Date   CHOL 230* 07/02/2015   HDL 60.00 07/02/2015   LDLCALC 133* 07/02/2015   LDLDIRECT 173.0 11/30/2014   TRIG 187.0* 07/02/2015   CHOLHDL 4 07/02/2015   sing medications without problems: Muscle aches: YES, see above Diet compliance: Poor Exercise: Minimal Other complaints:  Hypertension:  Well controlled on current regimen. BP Readings from Last 3 Encounters:  07/06/15 136/82  07/02/15 132/74  03/09/15 136/74   Using medication without problems or lightheadedness: NUone Chest pain with  exertion:None Edema:occ Short of breath:None Average home BPs: not checking Other issues:   Review of Systems  Constitutional: Negative for fever and fatigue.  HENT: Negative for ear pain.  Eyes: Negative for pain.  Respiratory: Negative for chest tightness and shortness of breath.  Cardiovascular: Negative for chest pain, palpitations and leg swelling.  Gastrointestinal: Negative for abdominal pain.  Genitourinary: Negative for dysuria.      Objective:  Physical Exam  Constitutional: Vital signs are normal. She appears well-developed and well-nourished. She is cooperative. Non-toxic appearance. She does not appear ill. No distress.  HENT:  Head: Normocephalic.  Right Ear: Hearing, tympanic membrane, external ear and ear canal normal. Tympanic membrane is not erythematous, not retracted and not bulging.  Left Ear: Hearing, tympanic membrane, external ear and ear canal normal. Tympanic membrane is not erythematous, not retracted and not bulging.  Nose: No mucosal edema or rhinorrhea. Right sinus exhibits no maxillary sinus tenderness and no frontal sinus tenderness. Left sinus exhibits no maxillary sinus tenderness and no frontal sinus tenderness.  Mouth/Throat: Uvula is midline, oropharynx is clear and moist and mucous membranes are normal.  Eyes: Conjunctivae, EOM and lids are normal. Pupils are equal, round, and reactive to light. Lids are everted and swept, no foreign bodies found.  Neck: Trachea normal and normal range of motion. Neck supple. Carotid bruit is not present. No thyroid mass and no thyromegaly present.  Cardiovascular: Normal rate, regular rhythm, S1 normal, S2 normal, normal heart sounds, intact distal pulses and normal pulses. Exam reveals no gallop and no friction rub.  No murmur heard. No swelling.  Pulmonary/Chest: Effort normal. No tachypnea. No respiratory distress. She has decreased breath sounds. She has no wheezes. She has no  rhonchi. She has no rales.  Abdominal: Soft. Normal appearance and bowel sounds are normal. There is no tenderness.  Musculoskeletal:  ttp in right buttock, positive SLR on right. Positive faber's. No focal vertebral ttp  Positive grind test in left CMC joint, ttp over base of left thumb and at wrist, mild swelling, no erythema. Neurological: She is alert.  Skin: Skin is warm, dry and intact. No rash noted.  Psychiatric: Her speech is normal and behavior is normal. Judgment and thought content normal. Her mood appears not anxious. Cognition and memory are normal. She does not exhibit a depressed mood.    Diabetic foot exam: Normal inspection No skin breakdown No calluses  Normal DP pulses Normal sensation to light touch and monofilament Nails normal

## 2015-07-06 NOTE — Assessment & Plan Note (Signed)
Inadequate control.. Low chol diet, offered nutritionist.. Refused.  INcrease pravastain to 20 mg daily.

## 2015-07-06 NOTE — Telephone Encounter (Signed)
What labs? The ones I am aware of ...need to be done in 3 month to check sugar A1Cagain.

## 2015-07-06 NOTE — Telephone Encounter (Signed)
Kaitlin Hamilton notified lab orders have been entered so she can have them drawn at Dr. Kathee Delton office in March.

## 2015-07-24 ENCOUNTER — Other Ambulatory Visit: Payer: Self-pay | Admitting: Family Medicine

## 2015-07-24 DIAGNOSIS — J3089 Other allergic rhinitis: Secondary | ICD-10-CM

## 2015-07-24 NOTE — Telephone Encounter (Signed)
Pt currently sees [facility] as PCP. Renaldo Fiddler, CMA

## 2015-07-25 ENCOUNTER — Telehealth: Payer: Self-pay | Admitting: Pulmonary Disease

## 2015-07-25 ENCOUNTER — Ambulatory Visit: Payer: Medicare Other | Admitting: Internal Medicine

## 2015-07-25 NOTE — Telephone Encounter (Signed)
Sounds like a virus Not sure antibiotic appropriate rec neil med rinses, OTC decongestants If no better PCP or [place]

## 2015-07-25 NOTE — Telephone Encounter (Signed)
Patient notified of BQ's suggestions.  Nothing further needed.

## 2015-07-25 NOTE — Telephone Encounter (Signed)
Patient complaining of nasal/chest congestion, drainage going down throat, PND, chills, no fever.  Patient would like medication called in.   [place]  Allergies  Allergen Reactions  . Adhesive [Tape]     Redness, bruising with any adhesives- bandaids, patches, etc.   . Atovaquone Itching  . Codeine Hives and Swelling  . Demerol [Meperidine] Hives and Swelling  . Hydrocodone Nausea Only  . Sulfa Antibiotics Rash

## 2015-08-02 ENCOUNTER — Telehealth: Payer: Self-pay | Admitting: Pulmonary Disease

## 2015-08-02 NOTE — Telephone Encounter (Signed)
Spoke with pt, states she faxed a letter that she had received from [facility] about her [place].  I advised that we have not yet received a fax about her Esbriet.  Pt will have this refaxed.  Will hold to follow up on fax.

## 2015-08-02 NOTE — Telephone Encounter (Signed)
Error.Stanley A Dalton ° °

## 2015-08-03 NOTE — Telephone Encounter (Signed)
Fax received; this a PA for Esbriet.  Will hold to initiate on Monday.

## 2015-08-03 NOTE — Telephone Encounter (Signed)
Fax has not been received yet. Will continue to await this fax.

## 2015-08-07 NOTE — Telephone Encounter (Signed)
Called to initiate PA for Esbriet-this has been approved 07/20/2016.  [id number]. Spoke with pt, aware of pa approval.  Nothing further needed.

## 2015-08-13 ENCOUNTER — Other Ambulatory Visit: Payer: Self-pay

## 2015-08-22 ENCOUNTER — Telehealth: Payer: Self-pay | Admitting: Pulmonary Disease

## 2015-08-22 NOTE — Telephone Encounter (Signed)
Kaitlin Hamilton says that she owns a life insurance policy on her mom that she pays for and she said that her mom's Kaitlin Hamilton is going to cost her mom $1000 per month and it leaves her mom with only $17, her monthly income is only $1017.  Kaitlin Hamilton says that the life insurance company will advance them 50% of her life insurance to help pay for her medical expenses or any expenses that may occur for home health, etc..Marland KitchenCrystal says that she is off work this week and if you have anymore questions, to please give her a call, otherwise, just let her know when the forms are ready to pick up.

## 2015-08-22 NOTE — Telephone Encounter (Signed)
Pt daughter returning call and can be reached @ [phone].Kaitlin Hamilton

## 2015-08-22 NOTE — Telephone Encounter (Signed)
Spoke with pt's son in law Editor, commissioning).  He dropped off forms to be filled out for pt's life insurance with [employer]. Per son in law he requests tat forms be filled out with accuracy esp with question #8 on form regarding life expectancy.  If pt is deemed less than 12 months then 1/2 the policy amount will be given to beneficiary up front.  Forms left in Dr [name] look at.  Can contact Mikki Santee at [phone] or pt's daughter who is [place] at [phone].

## 2015-08-22 NOTE — Telephone Encounter (Signed)
Sure I'll do it

## 2015-08-22 NOTE — Telephone Encounter (Signed)
ATC [name] - Unable to leave message - VM not set up - [facility]

## 2015-08-22 NOTE — Telephone Encounter (Signed)
[name] aware that Dr. [name] will complete forms, please contact her when forms are complete.  To Caryl Pina for followup

## 2015-08-22 NOTE — Telephone Encounter (Signed)
OK, can somebody call [name] and make sure this is legit?  I'm happy to do it but I've never met him but I've know [name] for 8 or 9 years.

## 2015-08-23 ENCOUNTER — Other Ambulatory Visit: Payer: Self-pay | Admitting: Family Medicine

## 2015-08-23 DIAGNOSIS — J3089 Other allergic rhinitis: Secondary | ICD-10-CM

## 2015-08-24 NOTE — Telephone Encounter (Signed)
Spoke with pt's daughter Donella Stade, aware that form has been completed.  Requests that form be placed in outgoing mail with included envelope.  Forms placed in mail.  Nothing further needed.

## 2015-08-31 ENCOUNTER — Other Ambulatory Visit: Payer: Self-pay | Admitting: Family Medicine

## 2015-08-31 NOTE — Telephone Encounter (Signed)
Last office visit 07/06/2015.  Last refilled 06/21/2015 for #180 with no refills.  Ok to refill?

## 2015-09-03 ENCOUNTER — Telehealth: Payer: Self-pay | Admitting: Pulmonary Disease

## 2015-09-03 NOTE — Telephone Encounter (Signed)
Per 08/02/15 phone note:  Len Blalock, CMA at 08/07/2015 9:42 AM     Status: Signed       Expand All Collapse All   Called to initiate PA for Esbriet-this has been approved 07/20/2016. [id number]. Spoke with pt, aware of pa approval. Nothing further needed.       ---  Called pt, line rings busy x 3 wcb

## 2015-09-04 ENCOUNTER — Other Ambulatory Visit: Payer: Self-pay | Admitting: Family Medicine

## 2015-09-04 MED ORDER — PIRFENIDONE 267 MG PO CAPS: 3.0000 | ORAL_CAPSULE | Freq: Three times a day (TID) | ORAL | Status: DC

## 2015-09-04 NOTE — Telephone Encounter (Signed)
[phone] pt calling back

## 2015-09-04 NOTE — Telephone Encounter (Signed)
Spoke with pt, states that a new rx for esbriet needs to be sent to [facility].  This has been sent.  Nothing further needed.

## 2015-09-04 NOTE — Telephone Encounter (Signed)
LM x1 for patient.  

## 2015-09-05 ENCOUNTER — Telehealth: Payer: Self-pay | Admitting: *Deleted

## 2015-09-05 NOTE — Telephone Encounter (Signed)
Received fax from [facility] requesting order for Prodigy Meter, Test Strips, Lancets & Alcohol pads.  Called and spoke with Kaitlin Hamilton.  She states she did not request diabetic testing supplies from the company.  Request denied and faxed back to [phone].

## 2015-09-10 ENCOUNTER — Other Ambulatory Visit: Payer: Self-pay | Admitting: Family Medicine

## 2015-09-12 DIAGNOSIS — G4733 Obstructive sleep apnea (adult) (pediatric): Secondary | ICD-10-CM | POA: Diagnosis not present

## 2015-09-17 ENCOUNTER — Telehealth: Payer: Self-pay | Admitting: Pulmonary Disease

## 2015-09-17 NOTE — Telephone Encounter (Signed)
Spoke with Anderson Malta at [facility], calling to verify that a PA has been done for Esbriet.  I advised that per 1/12 phone note pa has been approved through the end of the year.  Anderson Malta expressed understanding.  Nothing further needed.

## 2015-09-19 ENCOUNTER — Telehealth: Payer: Self-pay | Admitting: Pulmonary Disease

## 2015-09-19 ENCOUNTER — Other Ambulatory Visit: Payer: Self-pay | Admitting: Family Medicine

## 2015-09-19 DIAGNOSIS — J3089 Other allergic rhinitis: Secondary | ICD-10-CM

## 2015-09-19 NOTE — Telephone Encounter (Signed)
Spoke with pt, states that she has not received her esbriet rx from [facility] yet.  I advised that I spoke with with [facility] pharmacy on 2/27 and verified that pa had been done.  Pt expressed understanding.  Nothing further needed.

## 2015-09-20 ENCOUNTER — Telehealth: Payer: Self-pay | Admitting: Pulmonary Disease

## 2015-09-20 ENCOUNTER — Other Ambulatory Visit: Payer: Self-pay | Admitting: Family Medicine

## 2015-09-20 NOTE — Telephone Encounter (Signed)
Received a fax saying that pt needs another PA on Esbriet.  This was done in January and was approved through the end of the year.  Called pt, states she switched insurance companies last month-which is why this needs to be redone.  Faxed pa filled out and faxed back to [facility] yesterday.  Received a fax today saying that more clinical info was needed for PA.  Called [phone] to provide needed info, was told that since yesterday [facility] had "made several attempts to contact our office unsucessfully", so PA was denied, now an appeal has to be done.  PA initiated by calling [phone], pt member ID #: [id number].  An expedited appeal has been initiated, and a determination is to be expected by Monday.   Will await decision.

## 2015-09-21 NOTE — Telephone Encounter (Signed)
Spoke with pt. She is aware that Kaitlin Hamilton started the appeal process for Esbriet. Will route back to [place] to ensure follow up.

## 2015-09-21 NOTE — Telephone Encounter (Signed)
Kaitlin Hamilton [phone] ext [phone] calling to speak to nurse about med, case is due today, he is also faxing over a form for [place] to answer one question and fax back

## 2015-09-21 NOTE — Telephone Encounter (Signed)
Patient Returned call ° ° °

## 2015-09-21 NOTE — Telephone Encounter (Signed)
i have received fax and placed it in BQ folder upfront to be passed out at end of day

## 2015-09-21 NOTE — Telephone Encounter (Signed)
Patient calling [phone], states she needs to speak to [facility] about esbriet being denied, states the insurance co. needs more info to refill this med for patient

## 2015-09-21 NOTE — Telephone Encounter (Signed)
Spoke with Kaitlin Hamilton to answer questions related to pt's Esbriet appeal.  Because this call has been made the fax received earlier today can be shredded.  Will continue to wait on outcome of appeal.

## 2015-09-21 NOTE — Telephone Encounter (Signed)
lmtcb x1 for pt. 

## 2015-09-24 ENCOUNTER — Other Ambulatory Visit: Payer: Self-pay

## 2015-09-24 DIAGNOSIS — J3089 Other allergic rhinitis: Secondary | ICD-10-CM

## 2015-09-24 MED ORDER — MONTELUKAST SODIUM 10 MG PO TABS: 10.0000 mg | ORAL_TABLET | Freq: Every day | ORAL | Status: DC

## 2015-09-24 NOTE — Telephone Encounter (Signed)
Pt request refill montelukast to [facility]; pt does not want to get thru [facility]. Per protocol advised pt done. Pt has appt with Dr [name] 10/19/15.

## 2015-09-26 DIAGNOSIS — G4733 Obstructive sleep apnea (adult) (pediatric): Secondary | ICD-10-CM | POA: Diagnosis not present

## 2015-09-26 DIAGNOSIS — J84111 Idiopathic interstitial pneumonia, not otherwise specified: Secondary | ICD-10-CM | POA: Diagnosis not present

## 2015-09-26 DIAGNOSIS — J8489 Other specified interstitial pulmonary diseases: Secondary | ICD-10-CM | POA: Diagnosis not present

## 2015-10-02 NOTE — Telephone Encounter (Signed)
lmtcb X1 for Kaitlin Hamilton at number/extension below to see what the outcome of Esbriet appeal is, as I've not yet received anything on this appeal.

## 2015-10-03 DIAGNOSIS — J8489 Other specified interstitial pulmonary diseases: Secondary | ICD-10-CM | POA: Diagnosis not present

## 2015-10-10 NOTE — Telephone Encounter (Signed)
Spoke with Kaitlin Hamilton, confirmed that [name] has been approved.  Nothing further needed.

## 2015-10-15 ENCOUNTER — Other Ambulatory Visit (INDEPENDENT_AMBULATORY_CARE_PROVIDER_SITE_OTHER): Payer: Medicare Other

## 2015-10-15 ENCOUNTER — Ambulatory Visit (INDEPENDENT_AMBULATORY_CARE_PROVIDER_SITE_OTHER): Payer: Medicare Other | Admitting: Pulmonary Disease

## 2015-10-15 ENCOUNTER — Encounter: Payer: Self-pay | Admitting: Pulmonary Disease

## 2015-10-15 VITALS — BP 122/70 | HR 72 | Ht 66.0 in | Wt 266.0 lb

## 2015-10-15 DIAGNOSIS — J84112 Idiopathic pulmonary fibrosis: Secondary | ICD-10-CM

## 2015-10-15 DIAGNOSIS — J9611 Chronic respiratory failure with hypoxia: Secondary | ICD-10-CM | POA: Diagnosis not present

## 2015-10-15 DIAGNOSIS — G4733 Obstructive sleep apnea (adult) (pediatric): Secondary | ICD-10-CM

## 2015-10-15 DIAGNOSIS — R0609 Other forms of dyspnea: Secondary | ICD-10-CM

## 2015-10-15 DIAGNOSIS — Z5181 Encounter for therapeutic drug level monitoring: Secondary | ICD-10-CM

## 2015-10-15 LAB — PULMONARY FUNCTION TEST
DL/VA % pred: 97 %
DL/VA: 4.9 ml/min/mmHg/L
DLCO UNC: 10.53 ml/min/mmHg
DLCO cor % pred: 44 %
DLCO cor: 11.89 ml/min/mmHg
DLCO unc % pred: 39 %
FEF 25-75 Post: 2.92 L/sec
FEF 25-75 Pre: 2.68 L/sec
FEF2575-%Change-Post: 9 %
FEF2575-%Pred-Post: 129 %
FEF2575-%Pred-Pre: 118 %
FEV1-%CHANGE-POST: 4 %
FEV1-%PRED-POST: 49 %
FEV1-%Pred-Pre: 47 %
FEV1-POST: 1.3 L
FEV1-PRE: 1.25 L
FEV1FVC-%Change-Post: 2 %
FEV1FVC-%Pred-Pre: 118 %
FEV6-%Change-Post: 1 %
FEV6-%PRED-PRE: 41 %
FEV6-%Pred-Post: 42 %
FEV6-POST: 1.4 L
FEV6-Pre: 1.37 L
FEV6FVC-%PRED-POST: 104 %
FEV6FVC-%PRED-PRE: 104 %
FVC-%CHANGE-POST: 1 %
FVC-%PRED-PRE: 40 %
FVC-%Pred-Post: 41 %
FVC-POST: 1.4 L
FVC-PRE: 1.37 L
POST FEV6/FVC RATIO: 100 %
PRE FEV6/FVC RATIO: 100 %
Post FEV1/FVC ratio: 93 %
Pre FEV1/FVC ratio: 91 %
RV % PRED: 34 %
RV: 0.75 L
TLC % PRED: 43 %
TLC: 2.34 L

## 2015-10-15 LAB — HEPATIC FUNCTION PANEL
ALBUMIN: 4.3 g/dL (ref 3.5–5.2)
ALT: 14 U/L (ref 0–35)
AST: 17 U/L (ref 0–37)
Alkaline Phosphatase: 66 U/L (ref 39–117)
BILIRUBIN TOTAL: 0.4 mg/dL (ref 0.2–1.2)
Bilirubin, Direct: 0.1 mg/dL (ref 0.0–0.3)
Total Protein: 7.4 g/dL (ref 6.0–8.3)

## 2015-10-15 LAB — CBC WITH DIFFERENTIAL/PLATELET
BASOS ABS: 0 10*3/uL (ref 0.0–0.1)
Basophils Relative: 0.3 % (ref 0.0–3.0)
EOS PCT: 1.6 % (ref 0.0–5.0)
Eosinophils Absolute: 0.1 10*3/uL (ref 0.0–0.7)
HCT: 39 % (ref 36.0–46.0)
Hemoglobin: 13.2 g/dL (ref 12.0–15.0)
LYMPHS ABS: 1.9 10*3/uL (ref 0.7–4.0)
Lymphocytes Relative: 24.4 % (ref 12.0–46.0)
MCHC: 33.8 g/dL (ref 30.0–36.0)
MCV: 89.5 fl (ref 78.0–100.0)
MONO ABS: 0.2 10*3/uL (ref 0.1–1.0)
MONOS PCT: 2.9 % — AB (ref 3.0–12.0)
NEUTROS ABS: 5.4 10*3/uL (ref 1.4–7.7)
NEUTROS PCT: 70.8 % (ref 43.0–77.0)
RBC: 4.36 Mil/uL (ref 3.87–5.11)
RDW: 15 % (ref 11.5–15.5)
WBC: 7.7 10*3/uL (ref 4.0–10.5)

## 2015-10-15 NOTE — Assessment & Plan Note (Signed)
Compliance report today notes 73% compliance. I've encouraged her to remain compliant regular.  Plan: Continue CPAP

## 2015-10-15 NOTE — Patient Instructions (Signed)
We will call you with the results of today's blood work Keep using her CPAP every night Stay active, exercise regularly When you were going to exercise more or do something like use the vacuum cleaner I recommend that you increase your oxygen as high as 8 L When she use Flonase 2 puffs each nostril daily, cetirizine daily. However, when you are experiencing worsening sinus symptoms I recommend that she substitute the cetirizine with a chlorpheniramine-phenylephrine combination tablet to be used 3-4 times a day Use saline rinses daily We will see you back in 3 months

## 2015-10-15 NOTE — Progress Notes (Signed)
Subjective:    Patient ID: Kaitlin Hamilton, female    DOB: 11/28/1950, 65 y.o.   MRN: [id number]  Synopsis: Kaitlin Hamilton is a very pleasant 65 year old female who first saw the [facility] pulmonary clinic in March 2014 for evaluation of shortness of breath. She had significant cough, wheezing, and sputum production requiring multiple rounds of antibiotics. Because of crackles on lung exam and an abnormal pulmonary function test she was referred to [place]. We ordered full pulmonary function testing which showed moderate restriction and a depressed DLCO in proportion to her restriction. There is no airflow obstruction and no change with bronchodilator administration. A chest x-ray performed in February 2014 was read as normal.  An open lung biopsy was performed in December 2014 showing UIP.  Because she has had multiple good responses to steroids, we have treated her with cellcept and prednisone.  In January 2015 she had severe insomnia, hallucinations and gastritis on high dose prednisone.  She had a rash to bactrim. Since February 2015 she has been on cellcept and low dose prednisone and dapsone. Because of progression in hypoxemia and lung function testing in 2016 we changed her therapy to anti-fibrotic therapy with Esbriet. Because of a strong concern for an underlying connective tissue disease it was decided that she will be maintained on low-dose CellCept.  HPI Chief Complaint  Patient presents with  . Follow-up    review PFT.  pt states her sob is at baseline.  pt also notes headache, dizziness, sinus congestion , r ear pain since last week.     She has been having more sinus symptoms and an ear ache in the last few weeks.  She has been blowing out green mucus, but she isn't coughing it out right now.  No significant chest congestion.  She had a mild cold about three weeks ago and she says that she took over the counter meds and it helps.  She is not taking the astelin because it caused her to  have a lot of nose sores.  She started taking Flonase three weeks ago.   She still has some dyspnea but subjectively it hasn't worsened.  She says that it will sometimes drop to 79 or 80% on 4-5 when running the vacuum cleaner.  Carrying groceries up stairs will cause her to get more dyspneic.  CPAP is going OK.  She has missed one or two a month.   Still taking her prescription.    Past Medical History  Diagnosis Date  . Bronchitis   . Chicken pox   . Depression   . Allergic rhinitis     uses Flonase daily  . Hyperlipidemia     lost 43 pounds and no meds required at present  . Hypokalemia   . Anxiety   . PONV (postoperative nausea and vomiting)   . Hypertension     takes Verapamil and HCTZ daily  . Shortness of breath     with exertion;takes Singulair daily as well as Flonase  . Pneumonia     last time in 2006  . History of bronchitis     2013  . H/O hiatal hernia   . Migraines     last one about a month ago  . Dysphagia   . Joint swelling     left thumb  . OA (osteoarthritis)     left knee  . GERD (gastroesophageal reflux disease)     takes Protonix daily  . Hemorrhoids   . History of colon  polyps   . Diverticulosis   . Urinary frequency   . History of kidney stones   . Non-insulin dependent type 2 diabetes mellitus ([place])     takes Glipizide daily  . Constipation     takes Fiber daily  . Hypothyroidism (acquired)     takes Synthroid daily  . Insomnia     doesn't take any meds for this        Review of Systems  Constitutional: Negative for fever, chills, activity change and fatigue.  HENT: Negative for congestion, ear pain, nosebleeds, postnasal drip, rhinorrhea and sinus pressure.   Respiratory: Positive for shortness of breath. Negative for cough and wheezing.   Cardiovascular: Negative for chest pain, palpitations and leg swelling.  Gastrointestinal: Negative for nausea and abdominal pain.       Objective:   Physical Exam  Filed Vitals:    10/15/15 0953  BP: 122/70  Pulse: 72  Height: '5\' 6"'$  (1.676 m)  Weight: 266 lb (120.657 kg)  SpO2: 100%  3L [place]   Gen:  no acute distress  HEENT: NCAT,  EOMi, OP clear,  PULM: Crackles 1/2 way up bilaterally CV: RRR, no mgr, no JVD AB: BS+, soft, nontender, no hsm Ext: warm, no edema, no clubbing Neuro: A&Ox4, maew   February 2014 simple spirometry performed by her primary care physician>> ratio 90%, FEV1 1.71 L (64% predicted, FVC 1.83 L 55% predicted; flow volume loop is not consistent with obstruction February 2014 chest x-ray at [facility] normal 10/19/2012 walked 500 feet in office on room air oxygenation did not drop below 90% 11/15/2012 Full PFT [name]> Ratio 87%, FEV1 2.00L > 2.07 with bronchodilator (3% change); TLC 3.44 L (65% pred), ERV 0.62 (57% pred), DLCO 15.1 ml/mmHg/min (59% pred) 11/2012 CT chest >> ([facility] read) centrilobular nodules, interlobular septal thickening worse in bases and periphery R lung > L; some GGO in bases and periphery as well, some bronchiectasis in the bases R > L; findings suggestive of fibrosis but not UIP; question hypersensitivity pneumonitis given centrilobular nodules; also question aspiration 03/2013 ANA, ANCA, Anti-Jo-1, ESR, RF, SCL-70, anti-centromere, SSA/SSB, all negative; CRP 0.6;  04/2013 Barium swallow> abnormal esophageal motility, GERD, hiatal hernia 04/2013 Full PFT> Ratio 93%, FEV1 1.92 L (71% pred), TLC 3.02L (56% pred), DLCO 15.94 (59% pred) 04/2013 CT chest ([name])> findings suggestive of but not diagnostic of NSIP, small pulmonary nodule 04/2013 6MW RA > 1100 feet, HR peak 109, O2 sat Nadir 87% 04/2013 HP panel >> 05/17/2013 3 month prednisone trial 06/2013 open lung biopsy> UIP 07/28/13 Cell cept and bactrim started > bactrim stopped 07/29/13 due to rash  07/2013 hospitalized for confusion > prednisone 07/2013 hospitalized for gastritis/abdominal pain> prednisone 08/22/2013 atovaquone caused rash 11/2013 6MW 1364 feet, O2 saturation  nadir 78% May 2015 full pulmonary function test ratio 93%, FEV1 1.71 L (64% predicted, no change with bronchodilator), total lung capacity 2.67 L (49% predicted), DLCO 13.11 (40% predicted) February 2016 CT chest high resolution> slight progression in reticular abnormalities consistent with pulmonary fibrosis, uip 08/21/14 6MW 1400 feet 90% on 8L 03/2015 PFT > Ratio 93%, FEV1 1.24L, FVC 1.33L (38% pred), TLC 2.56L (47% pred), DLCO 12.10 (44% pred) March 2017 pulmonary function testing ratio 93%, FVC (41% predicted), total lung capacity 2.34 L (43% protected), DLCO 10.53 (39% protected).   06/2015 CBC, BMET and LFT's reviewed     Assessment & Plan:   UIP (usual interstitial pneumonitis) ([place]) Pulmonary function testing again today shows no major decline since the  last visit. Her DLCO numbers have been upper down in the last year and a half, her FVC is also however around 1.3-1.4 so in general I feel that things are stable. Fortunately her symptoms are stable as well.  Plan: Continue CellCept 500 mg daily Continue pirfenidone as prescribed Check CBC and liver function tests for therapeutic drug monitoring today 6 minute walk today Return to clinic in 3 months Plan on repeating ulnar function testing and 6 minute walk testing in 6 months.  OSA (obstructive sleep apnea) Compliance report today notes 73% compliance. I've encouraged her to remain compliant regular.  Plan: Continue CPAP  Chronic hypoxemic respiratory failure This is been a stable interval. I have encouraged her to increase her oxygen to his high as 8 L when needed for exercise to maintain her O2 saturation greater than 90%. Otherwise at rest she is fine at 3 L.   > 50% of time spent face to face in a 30 minute visit  Updated Medication List Outpatient Encounter Prescriptions as of 10/15/2015  Medication Sig  . acetaminophen (TYLENOL) 500 MG tablet Take 1,000 mg by mouth every 6 (six) hours as needed for moderate pain.  Marland Kitchen  diclofenac (VOLTAREN) 75 MG EC tablet Take 1 tablet by mouth two  times daily (Patient taking differently: Take 1 tablet by mouth two  times daily as needed)  . diphenhydrAMINE (BENADRYL) 25 mg capsule Take 25 mg by mouth as needed.  Marland Kitchen FIBER PO Take 1 tablet by mouth daily.  . fluticasone (FLONASE) 50 MCG/ACT nasal spray Place 1 spray into both nostrils daily.  Marland Kitchen glipiZIDE (GLUCOTROL) 10 MG tablet Take 1 tablet (10 mg total) by mouth 2 (two) times daily before a meal.  . hydrocortisone cream 0.5 % Apply 1 application topically 2 (two) times daily as needed (for rash).  Marland Kitchen levothyroxine (SYNTHROID, LEVOTHROID) 125 MCG tablet Take 1 tablet (125 mcg total) by mouth daily before breakfast.  . losartan-hydrochlorothiazide (HYZAAR) 50-12.5 MG per tablet Take 1 tablet by mouth daily.  . montelukast (SINGULAIR) 10 MG tablet Take 1 tablet (10 mg total) by mouth at bedtime.  . mycophenolate (CELLCEPT) 500 MG tablet Take 1 tablet (500 mg total) by mouth daily.  . NON FORMULARY Place 3 L into the nose daily. 4-5 Liters with exertion  . nystatin cream (MYCOSTATIN) Apply 1 application topically 2 (two) times daily.  . pantoprazole (PROTONIX) 40 MG tablet Take 1 tablet (40 mg total) by mouth 2 (two) times daily.  . pioglitazone (ACTOS) 45 MG tablet Take 1 tablet (45 mg total) by mouth daily.  . Pirfenidone 267 MG CAPS Take 3 capsules by mouth 3 (three) times daily.  . pravastatin (PRAVACHOL) 20 MG tablet Take 1 tablet (20 mg total) by mouth daily.  . saxagliptin HCl (ONGLYZA) 5 MG TABS tablet Take 1 tablet (5 mg total) by mouth daily.  . verapamil (CALAN-SR) 180 MG CR tablet 1 Tablet ER, Oral, daily  . traZODone (DESYREL) 50 MG tablet Take 25 mg by mouth at bedtime. Reported on 10/15/2015  . [DISCONTINUED] Azelastine-Fluticasone (DYMISTA) 137-50 MCG/ACT SUSP One puff per nostril twice daily (Patient not taking: Reported on 10/15/2015)  . [DISCONTINUED] doxycycline (VIBRA-TABS) 100 MG tablet Take 1 tablet (100 mg  total) by mouth 2 (two) times daily. (Patient not taking: Reported on 10/15/2015)   No facility-administered encounter medications on file as of 10/15/2015.

## 2015-10-15 NOTE — Progress Notes (Signed)
PFT done today. 

## 2015-10-15 NOTE — Assessment & Plan Note (Signed)
Pulmonary function testing again today shows no major decline since the last visit. Her DLCO numbers have been upper down in the last year and a half, her FVC is also however around 1.3-1.4 so in general I feel that things are stable. Fortunately her symptoms are stable as well.  Plan: Continue CellCept 500 mg daily Continue pirfenidone as prescribed Check CBC and liver function tests for therapeutic drug monitoring today 6 minute walk today Return to clinic in 3 months Plan on repeating ulnar function testing and 6 minute walk testing in 6 months.

## 2015-10-15 NOTE — Assessment & Plan Note (Signed)
This is been a stable interval. I have encouraged her to increase her oxygen to his high as 8 L when needed for exercise to maintain her O2 saturation greater than 90%. Otherwise at rest she is fine at 3 L.

## 2015-10-19 ENCOUNTER — Ambulatory Visit (INDEPENDENT_AMBULATORY_CARE_PROVIDER_SITE_OTHER): Payer: Medicare Other | Admitting: Family Medicine

## 2015-10-19 ENCOUNTER — Encounter: Payer: Self-pay | Admitting: Family Medicine

## 2015-10-19 VITALS — BP 102/60 | HR 82 | Temp 97.6°F | Ht 66.0 in | Wt 265.0 lb

## 2015-10-19 DIAGNOSIS — Z23 Encounter for immunization: Secondary | ICD-10-CM | POA: Diagnosis not present

## 2015-10-19 DIAGNOSIS — I10 Essential (primary) hypertension: Secondary | ICD-10-CM | POA: Diagnosis not present

## 2015-10-19 DIAGNOSIS — E7800 Pure hypercholesterolemia, unspecified: Secondary | ICD-10-CM

## 2015-10-19 DIAGNOSIS — IMO0001 Reserved for inherently not codable concepts without codable children: Secondary | ICD-10-CM

## 2015-10-19 DIAGNOSIS — E1165 Type 2 diabetes mellitus with hyperglycemia: Secondary | ICD-10-CM

## 2015-10-19 LAB — COMPREHENSIVE METABOLIC PANEL
ALBUMIN: 4.5 g/dL (ref 3.5–5.2)
ALT: 16 U/L (ref 0–35)
AST: 18 U/L (ref 0–37)
Alkaline Phosphatase: 65 U/L (ref 39–117)
BUN: 19 mg/dL (ref 6–23)
CALCIUM: 10.2 mg/dL (ref 8.4–10.5)
CHLORIDE: 100 meq/L (ref 96–112)
CO2: 33 mEq/L — ABNORMAL HIGH (ref 19–32)
Creatinine, Ser: 0.82 mg/dL (ref 0.40–1.20)
GFR: 74.39 mL/min (ref >60.00)
Glucose, Bld: 120 mg/dL — ABNORMAL HIGH (ref 70–99)
POTASSIUM: 4.3 meq/L (ref 3.5–5.1)
SODIUM: 139 meq/L (ref 135–145)
Total Bilirubin: 0.4 mg/dL (ref 0.2–1.2)
Total Protein: 7.4 g/dL (ref 6.0–8.3)

## 2015-10-19 LAB — LIPID PANEL
CHOL/HDL RATIO: 4
Cholesterol: 227 mg/dL — ABNORMAL HIGH (ref 0–200)
HDL: 63.6 mg/dL (ref >39.00)
LDL CALC: 135 mg/dL — AB (ref 0–99)
NonHDL: 163.43
TRIGLYCERIDES: 143 mg/dL (ref 0.0–149.0)
VLDL: 28.6 mg/dL (ref 0.0–40.0)

## 2015-10-19 LAB — HEMOGLOBIN A1C: Hgb A1c MFr Bld: 7.6 % — ABNORMAL HIGH (ref 4.6–6.5)

## 2015-10-19 LAB — HM DIABETES FOOT EXAM

## 2015-10-19 MED ORDER — SAXAGLIPTIN HCL 5 MG PO TABS: 5.0000 mg | ORAL_TABLET | Freq: Every day | ORAL | Status: DC

## 2015-10-19 MED ORDER — GLIPIZIDE 10 MG PO TABS: 10.0000 mg | ORAL_TABLET | Freq: Two times a day (BID) | ORAL | Status: DC

## 2015-10-19 MED ORDER — LEVOTHYROXINE SODIUM 125 MCG PO TABS: 125.0000 ug | ORAL_TABLET | Freq: Every day | ORAL | Status: DC

## 2015-10-19 MED ORDER — VERAPAMIL HCL ER 180 MG PO TBCR: EXTENDED_RELEASE_TABLET | ORAL | Status: DC

## 2015-10-19 NOTE — Assessment & Plan Note (Signed)
Well controlled. Continue current medication.  

## 2015-10-19 NOTE — Addendum Note (Signed)
Addended by: Carter Kitten on: 10/19/2015 11:16 AM   Modules accepted: Orders

## 2015-10-19 NOTE — Assessment & Plan Note (Signed)
Due for re-eval. Encouraged exercise, weight loss, healthy eating habits.  

## 2015-10-19 NOTE — Addendum Note (Signed)
Addended by: Ellamae Sia on: 10/19/2015 11:37 AM   Modules accepted: Orders

## 2015-10-19 NOTE — Addendum Note (Signed)
Addended by: Carter Kitten on: 10/19/2015 12:22 PM   Modules accepted: Orders

## 2015-10-19 NOTE — Patient Instructions (Addendum)
Stop at lab on way out.  Keep working on healthy eating, exercise and weight loss.  Follow up sooner if right leg pain not improving for visit just to talk about that.

## 2015-10-19 NOTE — Progress Notes (Signed)
65 year old female on continuous oxygen for UIP/IPF presents for DM follow up. Started new lung medicine and continued cellcept in 10/2014. She has been OFF PREDNSIONE.   Intermittant pain in right leg. Pain in lateral ankle shoots up to hip. Pain improved with diclofenac but returns.  Diabetes: Due for re-eval. On Glucotrol 10 mg, and 5 mg of onglyza, actos 45.  SE to metformin in past. Lab Results  Component Value Date   HGBA1C 9.2* 07/02/2015  Using medications without difficulties:None Hypoglycemic episodes:None Hyperglycemic episodes:yes but less frequent Feet problems:None, leg pains Blood Sugars averaging: FBS 146-180, lower this month compared to last. eye exam within last year: yes  . Wt Readings from Last 3 Encounters:  10/19/15 265 lb (120.203 kg)  10/15/15 266 lb (120.657 kg)  07/06/15 254 lb 4 oz (115.327 kg)   She has stopped soda, drinking water, still skipping meals and eating carbs.  Elevated Cholesterol: Need re-eval on pravastatin 20 mg daily In past atorvastatin caused muscle ache.  Lab Results  Component Value Date   CHOL 230* 07/02/2015   HDL 60.00 07/02/2015   LDLCALC 133* 07/02/2015   LDLDIRECT 173.0 11/30/2014   TRIG 187.0* 07/02/2015   CHOLHDL 4 07/02/2015  Using medications without problems: Muscle aches: NONE on this dose of pravastatin Diet compliance: Poor Exercise: 2-3 times a week Other complaints:  Hypertension:  Well controlled on current regimen. BP Readings from Last 3 Encounters:  10/19/15 102/60  10/15/15 122/70  07/06/15 136/82  Using medication without problems or lightheadedness: NUone Chest pain with exertion:None Edema:occ Short of breath:stable Average home BPs: not checking Other issues:   Review of Systems  Constitutional: Negative for fever and fatigue.  HENT: Negative for ear pain.  Eyes: Negative for pain.  Respiratory: Negative for chest tightness and shortness of breath.  Cardiovascular:  Negative for chest pain, palpitations and leg swelling.  Gastrointestinal: Negative for abdominal pain.  Genitourinary: Negative for dysuria.      Objective:  Physical Exam  Constitutional: Vital signs are normal. She appears well-developed and well-nourished. She is cooperative. Non-toxic appearance. She does not appear ill. No distress.  HENT:  Head: Normocephalic.  Right Ear: Hearing, tympanic membrane, external ear and ear canal normal. Tympanic membrane is not erythematous, not retracted and not bulging.  Left Ear: Hearing, tympanic membrane, external ear and ear canal normal. Tympanic membrane is not erythematous, not retracted and not bulging.  Nose: No mucosal edema or rhinorrhea. Right sinus exhibits no maxillary sinus tenderness and no frontal sinus tenderness. Left sinus exhibits no maxillary sinus tenderness and no frontal sinus tenderness.  Mouth/Throat: Uvula is midline, oropharynx is clear and moist and mucous membranes are normal.  Eyes: Conjunctivae, EOM and lids are normal. Pupils are equal, round, and reactive to light. Lids are everted and swept, no foreign bodies found.  Neck: Trachea normal and normal range of motion. Neck supple. Carotid bruit is not present. No thyroid mass and no thyromegaly present.  Cardiovascular: Normal rate, regular rhythm, S1 normal, S2 normal, normal heart sounds, intact distal pulses and normal pulses. Exam reveals no gallop and no friction rub.  No murmur heard. No swelling.  Pulmonary/Chest: Effort normal. No tachypnea. No respiratory distress. She has decreased breath sounds. She has no wheezes. She has no rhonchi. She has no rales.  Abdominal: Soft. Normal appearance and bowel sounds are normal. There is no tenderness.  Neurological: She is alert.  Skin: Skin is warm, dry and intact. No rash noted.  Psychiatric:  Her speech is normal and behavior is normal. Judgment and thought content normal. Her mood appears not  anxious. Cognition and memory are normal. She does not exhibit a depressed mood.    Diabetic foot exam: Normal inspection No skin breakdown No calluses  Normal DP pulses Normal sensation to light touch and monofilament Nails normal

## 2015-10-19 NOTE — Progress Notes (Signed)
Pre visit review using our clinic review tool, if applicable. No additional management support is needed unless otherwise documented below in the visit note. 

## 2015-10-19 NOTE — Assessment & Plan Note (Signed)
Due for re-eval. FBS seem to be trending down.

## 2015-10-22 ENCOUNTER — Other Ambulatory Visit: Payer: Self-pay | Admitting: Family Medicine

## 2015-10-23 NOTE — Progress Notes (Signed)
Have her increase to 1 and 1/2 tabs... chsnge in med list.

## 2015-10-25 ENCOUNTER — Telehealth: Payer: Self-pay | Admitting: Pulmonary Disease

## 2015-10-25 NOTE — Telephone Encounter (Signed)
Spoke with [employer] (pt's daughter and DPR on file) states she needs [place] to fax over PFT results to [employer] where she own policy on her mother. Daughter is aware that she will need to fax over the forms where patient has given permission to send results---based on convo daughter states she had with [facility] and states he has already sent records to them about 1 month ago-do not see anything in EPIC from [employer].   Will hold in Triage as daughter will fax over to Triage's attention on Friday morning.

## 2015-10-30 NOTE — Telephone Encounter (Signed)
This fax has not been received yet. Attempted to call pt's daughter. No answer, voicemail has not been set up. Will try back.

## 2015-10-31 NOTE — Telephone Encounter (Signed)
Attempted to contact pt's daughter, [name]. Voicemail not set up Will call back.

## 2015-11-01 NOTE — Telephone Encounter (Signed)
Checked [facility]'s look-at's and form has not been received.   ATC pt's daughter, VM has not been set up yet. WCB.

## 2015-11-03 DIAGNOSIS — J8489 Other specified interstitial pulmonary diseases: Secondary | ICD-10-CM | POA: Diagnosis not present

## 2015-11-05 NOTE — Telephone Encounter (Signed)
ATC pt's daughter, vmail not set up, wcb

## 2015-11-06 NOTE — Telephone Encounter (Signed)
atc pt's daughter, vm not yet set up.  Wcb.

## 2015-11-07 NOTE — Telephone Encounter (Signed)
Per chart, Medical Records has received request for medical records from [facility].  Directed pt's daughter, Donella Stade, to medical records and provided # to check on status.  She verbalized understanding, voiced no further questions or concerns at this time, and is to call back if anything further is needed.

## 2015-12-03 ENCOUNTER — Other Ambulatory Visit: Payer: Self-pay | Admitting: Family Medicine

## 2015-12-03 DIAGNOSIS — J8489 Other specified interstitial pulmonary diseases: Secondary | ICD-10-CM | POA: Diagnosis not present

## 2015-12-11 ENCOUNTER — Encounter: Payer: Self-pay | Admitting: Internal Medicine

## 2015-12-11 ENCOUNTER — Ambulatory Visit (INDEPENDENT_AMBULATORY_CARE_PROVIDER_SITE_OTHER): Payer: Medicare Other | Admitting: Internal Medicine

## 2015-12-11 VITALS — BP 120/74 | HR 78 | Temp 97.7°F | Wt 266.5 lb

## 2015-12-11 DIAGNOSIS — B9789 Other viral agents as the cause of diseases classified elsewhere: Secondary | ICD-10-CM

## 2015-12-11 DIAGNOSIS — J329 Chronic sinusitis, unspecified: Secondary | ICD-10-CM | POA: Diagnosis not present

## 2015-12-11 DIAGNOSIS — B349 Viral infection, unspecified: Secondary | ICD-10-CM | POA: Diagnosis not present

## 2015-12-11 MED ORDER — GUAIFENESIN 100 MG/5ML PO SYRP: 200.0000 mg | ORAL_SOLUTION | Freq: Three times a day (TID) | ORAL | Status: DC | PRN

## 2015-12-11 NOTE — Progress Notes (Addendum)
HPI  Pt presents to the clinic today with c/o headache runny nose and cough. This started 3 days ago but seemed to get worse yesterday. She is blowing yellow, blood tinged mucous out of her nose. The cough is productive of thin, white mucous. She denies fever, chills or body aches. She has tried [employer], Flonase, Audiological scientist. She has a history of environmental allergies and pulmonary fibrosis. She has not had sick contacts that she is aware of.  Review of Systems      Past Medical History  Diagnosis Date  . Bronchitis   . Chicken pox   . Depression   . Allergic rhinitis     uses Flonase daily  . Hyperlipidemia     lost 43 pounds and no meds required at present  . Hypokalemia   . Anxiety   . PONV (postoperative nausea and vomiting)   . Hypertension     takes Verapamil and HCTZ daily  . Shortness of breath     with exertion;takes Singulair daily as well as Flonase  . Pneumonia     last time in 2006  . History of bronchitis     2013  . H/O hiatal hernia   . Migraines     last one about a month ago  . Dysphagia   . Joint swelling     left thumb  . OA (osteoarthritis)     left knee  . GERD (gastroesophageal reflux disease)     takes Protonix daily  . Hemorrhoids   . History of colon polyps   . Diverticulosis   . Urinary frequency   . History of kidney stones   . Non-insulin dependent type 2 diabetes mellitus ([place])     takes Glipizide daily  . Constipation     takes Fiber daily  . Hypothyroidism (acquired)     takes Synthroid daily  . Insomnia     doesn't take any meds for this    Family History  Problem Relation Age of Onset  . Rheum arthritis Mother   . Rheum arthritis Sister   . Prostate cancer Brother   . Heart disease Maternal Grandmother   . Heart disease Maternal Grandfather   . Uterine cancer Daughter   . Colon cancer Neg Hx     Social History   Social History  . Marital Status: Widowed    Spouse Name: N/A  . Number of Children: 2  .  Years of Education: N/A   Occupational History  . Day Care at [employer]    Social History Main Topics  . Smoking status: Never Smoker   . Smokeless tobacco: Never Used  . Alcohol Use: No  . Drug Use: No  . Sexual Activity: No   Other Topics Concern  . Not on file   Social History Narrative   Daily caffeine     Exercise: none recently    Diet: poor    Allergies  Allergen Reactions  . Adhesive [Tape]     Redness, bruising with any adhesives- bandaids, patches, etc.   . Atovaquone Itching  . Codeine Hives and Swelling  . Demerol [Meperidine] Hives and Swelling  . Hydrocodone Nausea Only  . Sulfa Antibiotics Rash     Constitutional: Positive headache, fatigue. Denies fever or abrupt weight changes.  HEENT:  Positive runny nose, sore throat. Denies eye redness, eye pain, pressure behind the eyes, facial pain, nasal congestion, ear pain, ringing in the ears, wax buildup, runny nose or bloody nose.  Respiratory: Positive cough. Denies difficulty breathing or shortness of breath.  Cardiovascular: Denies chest pain, chest tightness, palpitations or swelling in the hands or feet.   No other specific complaints in a complete review of systems (except as listed in HPI above).  Objective:  BP 120/74 mmHg  Pulse 78  Temp(Src) 97.7 F (36.5 C) (Oral)  Wt 266 lb 8 oz (120.884 kg)  SpO2 98%  Wt Readings from Last 3 Encounters:  12/11/15 266 lb 8 oz (120.884 kg)  10/19/15 265 lb (120.203 kg)  10/15/15 266 lb (120.657 kg)    General: Appears her stated age, obese, chronically ill appearing, in NAD. HEENT: Head: normal shape and size, no sinus tenderness noted; Eyes: sclera white, no icterus, conjunctiva pink; Ears: Tm's pink but intact, normal light reflex; Nose: mucosa pink and moist, septum midline; Throat/Mouth: Teeth present, mucosa pink and moist, no exudate noted, no lesions or ulcerations noted.  Neck: No cervical lymphadenopathy.  Cardiovascular: Normal rate and rhythm. S1,S2  noted.  No murmur, rubs or gallops noted.  Pulmonary/Chest: Normal effort. Clear but diminished. No respiratory distress. No wheezes, rales or ronchi noted.      Assessment & Plan:   Viral Sinusitis:  Get some rest and drink plenty of water Do salt water gargles for the sore throat Continue Flonase, Zyrtec and Singulair eRx for Diabetic Tussin cough syrup  RTC as needed or if symptoms persist.   Webb Silversmith, NP

## 2015-12-11 NOTE — Patient Instructions (Signed)

## 2015-12-11 NOTE — Progress Notes (Signed)
Pre visit review using our clinic review tool, if applicable. No additional management support is needed unless otherwise documented below in the visit note. 

## 2016-01-03 DIAGNOSIS — J8489 Other specified interstitial pulmonary diseases: Secondary | ICD-10-CM | POA: Diagnosis not present

## 2016-01-04 ENCOUNTER — Other Ambulatory Visit: Payer: Self-pay

## 2016-01-04 ENCOUNTER — Telehealth: Payer: Self-pay | Admitting: Pulmonary Disease

## 2016-01-04 MED ORDER — DICLOFENAC SODIUM 75 MG PO TBEC: DELAYED_RELEASE_TABLET | ORAL | Status: DC

## 2016-01-04 NOTE — Telephone Encounter (Signed)
PA was initiated on CCM. KeyNB:[id number]. This medication has been approved. PA approval >> [id number].  Attempted to contact pt's daughter. Voicemail has not been set up. Will try back.

## 2016-01-04 NOTE — Telephone Encounter (Signed)
Pt left v/m requesting refill diclofenac to [facility]. Last refilled # 180 on 08/31/15; next CPX 01/2016. Refilled per protocol and pt voiced understanding.

## 2016-01-07 ENCOUNTER — Encounter: Payer: Self-pay | Admitting: Pulmonary Disease

## 2016-01-07 ENCOUNTER — Ambulatory Visit (INDEPENDENT_AMBULATORY_CARE_PROVIDER_SITE_OTHER): Payer: Medicare Other | Admitting: Pulmonary Disease

## 2016-01-07 ENCOUNTER — Other Ambulatory Visit (INDEPENDENT_AMBULATORY_CARE_PROVIDER_SITE_OTHER): Payer: Medicare Other

## 2016-01-07 VITALS — BP 144/78 | HR 71 | Ht 66.0 in | Wt 260.0 lb

## 2016-01-07 DIAGNOSIS — J84112 Idiopathic pulmonary fibrosis: Secondary | ICD-10-CM | POA: Diagnosis not present

## 2016-01-07 LAB — CBC WITH DIFFERENTIAL/PLATELET
BASOS PCT: 0.6 % (ref 0.0–3.0)
Basophils Absolute: 0.1 10*3/uL (ref 0.0–0.1)
EOS PCT: 2.5 % (ref 0.0–5.0)
Eosinophils Absolute: 0.2 10*3/uL (ref 0.0–0.7)
HCT: 38.4 % (ref 36.0–46.0)
Hemoglobin: 12.8 g/dL (ref 12.0–15.0)
LYMPHS ABS: 2.3 10*3/uL (ref 0.7–4.0)
Lymphocytes Relative: 25.4 % (ref 12.0–46.0)
MCHC: 33.3 g/dL (ref 30.0–36.0)
MCV: 90.5 fl (ref 78.0–100.0)
MONO ABS: 0.6 10*3/uL (ref 0.1–1.0)
MONOS PCT: 6.6 % (ref 3.0–12.0)
NEUTROS ABS: 5.8 10*3/uL (ref 1.4–7.7)
NEUTROS PCT: 64.9 % (ref 43.0–77.0)
PLATELETS: 251 10*3/uL (ref 150.0–400.0)
RBC: 4.24 Mil/uL (ref 3.87–5.11)
RDW: 15 % (ref 11.5–15.5)
WBC: 8.9 10*3/uL (ref 4.0–10.5)

## 2016-01-07 LAB — BASIC METABOLIC PANEL
BUN: 13 mg/dL (ref 6–23)
CALCIUM: 9.8 mg/dL (ref 8.4–10.5)
CO2: 29 meq/L (ref 19–32)
CREATININE: 0.71 mg/dL (ref 0.40–1.20)
Chloride: 99 mEq/L (ref 96–112)
GFR: 87.78 mL/min (ref >60.00)
Glucose, Bld: 95 mg/dL (ref 70–99)
Potassium: 4 mEq/L (ref 3.5–5.1)
SODIUM: 139 meq/L (ref 135–145)

## 2016-01-07 LAB — HEPATIC FUNCTION PANEL
ALBUMIN: 4.2 g/dL (ref 3.5–5.2)
ALK PHOS: 74 U/L (ref 39–117)
ALT: 12 U/L (ref 0–35)
AST: 14 U/L (ref 0–37)
BILIRUBIN DIRECT: 0.1 mg/dL (ref 0.0–0.3)
TOTAL PROTEIN: 7.4 g/dL (ref 6.0–8.3)
Total Bilirubin: 0.5 mg/dL (ref 0.2–1.2)

## 2016-01-07 NOTE — Telephone Encounter (Signed)
atc pt's daughter Kaitlin Hamilton X2, ano answer, vm not yet set up.  Wcb.

## 2016-01-07 NOTE — Patient Instructions (Addendum)
We will do CBC, BMET and liver function tests today. Continue your Cellcept 500 mg daily Continue your Pirfenidone Continue your oxygen at 3L at rest and 6-8 L with exertion. Follow up in 4 months with Dr. [name] Please contact office for sooner follow up if symptoms do not improve or worsen or seek emergency care

## 2016-01-07 NOTE — Progress Notes (Signed)
History of Present Illness Kaitlin Hamilton is a 65 y.o. female with UIP, open lung biopsy confirmed, with suspected underlying connective tissue disorder component. Treated with low dose Cellcept and Pirfenidone  She wears her CPAP compliantly.   6/19/20173 month follow up: Patient presents for follow up. "She is doing well. She did have to go to her PCP for allergies. She was started on a non-sedating anti histamine and that has resolved the problem. She did not need an antibiotic.She is compliant with her Singulair, Cellcept and Pirfenidone. She is wearing her oxygen. She still has dyspnea with exertion, not at rest.She is compliant with her CPAP, has only missed one or 2 nights..Last PFT's were done 09/2015. She  did not completed her 6 minute walk in March due to leg aches.She states she has Left knee pain and right sciatic pain that prevents her from doing well at the 6 minute walk.She is currently on 3L oxygen at rest and up to 6 or 7 L with exertion.She does have significant crackles upon auscultation.She denies fever, chest pain. Purulent secretions, orthopnea or hemoptysis, or worsening of her dyspnea.  Tests March 2017 pulmonary function testing ratio 93%, FVC (41% predicted), total lung capacity 2.34 L (43% protected), DLCO 10.53 (39% protected).  Past medical hx Past Medical History  Diagnosis Date  . Bronchitis   . Chicken pox   . Depression   . Allergic rhinitis     uses Flonase daily  . Hyperlipidemia     lost 43 pounds and no meds required at present  . Hypokalemia   . Anxiety   . PONV (postoperative nausea and vomiting)   . Hypertension     takes Verapamil and HCTZ daily  . Shortness of breath     with exertion;takes Singulair daily as well as Flonase  . Pneumonia     last time in 2006  . History of bronchitis     2013  . H/O hiatal hernia   . Migraines     last one about a month ago  . Dysphagia   . Joint swelling     left thumb  . OA (osteoarthritis)     left  knee  . GERD (gastroesophageal reflux disease)     takes Protonix daily  . Hemorrhoids   . History of colon polyps   . Diverticulosis   . Urinary frequency   . History of kidney stones   . Non-insulin dependent type 2 diabetes mellitus ([place])     takes Glipizide daily  . Constipation     takes Fiber daily  . Hypothyroidism (acquired)     takes Synthroid daily  . Insomnia     doesn't take any meds for this     Past surgical hx, Family hx, Social hx all reviewed.  Current Outpatient Prescriptions on File Prior to Visit  Medication Sig  . acetaminophen (TYLENOL) 500 MG tablet Take 1,000 mg by mouth every 6 (six) hours as needed for moderate pain.  Marland Kitchen diclofenac (VOLTAREN) 75 MG EC tablet Take 1 tablet by mouth two  times daily  . diphenhydrAMINE (BENADRYL) 25 mg capsule Take 25 mg by mouth as needed.  Marland Kitchen FIBER PO Take 1 tablet by mouth daily.  . fluticasone (FLONASE) 50 MCG/ACT nasal spray Place 1 spray into both nostrils daily.  Marland Kitchen glipiZIDE (GLUCOTROL) 10 MG tablet Take 1 tablet (10 mg total) by mouth 2 (two) times daily before a meal.  . guaifenesin (ROBITUSSIN) 100 MG/5ML syrup Take 10  mLs (200 mg total) by mouth 3 (three) times daily as needed for cough.  . hydrocortisone cream 0.5 % Apply 1 application topically 2 (two) times daily as needed (for rash).  Marland Kitchen levothyroxine (SYNTHROID, LEVOTHROID) 125 MCG tablet Take 1 tablet (125 mcg total) by mouth daily before breakfast.  . losartan-hydrochlorothiazide (HYZAAR) 50-12.5 MG tablet Take 1 tablet by mouth daily.  . montelukast (SINGULAIR) 10 MG tablet Take 1 tablet (10 mg total) by mouth at bedtime.  . mycophenolate (CELLCEPT) 500 MG tablet Take 1 tablet (500 mg total) by mouth daily.  . NON FORMULARY Place 3 L into the nose daily. 4-5 Liters with exertion  . nystatin cream (MYCOSTATIN) Apply 1 application topically 2 (two) times daily.  . pantoprazole (PROTONIX) 40 MG tablet Take 1 tablet by mouth two  times daily  . pioglitazone  (ACTOS) 45 MG tablet Take 1 tablet (45 mg total) by mouth daily.  . Pirfenidone 267 MG CAPS Take 3 capsules by mouth 3 (three) times daily.  . pravastatin (PRAVACHOL) 20 MG tablet Take 1 tablet (20 mg total) by mouth daily.  . saxagliptin HCl (ONGLYZA) 5 MG TABS tablet Take 1 tablet (5 mg total) by mouth daily.  . verapamil (CALAN-SR) 180 MG CR tablet 1 Tablet ER, Oral, daily   No current facility-administered medications on file prior to visit.     Allergies  Allergen Reactions  . Adhesive [Tape]     Redness, bruising with any adhesives- bandaids, patches, etc.   . Atovaquone Itching  . Codeine Hives and Swelling  . Demerol [Meperidine] Hives and Swelling  . Hydrocodone Nausea Only  . Sulfa Antibiotics Rash    Review Of Systems:  Constitutional:   No  weight loss, night sweats,  Fevers, chills, fatigue, or  lassitude.  HEENT:   No headaches,  Difficulty swallowing,  Tooth/dental problems, or  Sore throat,                No sneezing, itching, ear ache, nasal congestion, post nasal drip,   CV:  No chest pain,  Orthopnea, PND, swelling in lower extremities, anasarca, dizziness, palpitations, syncope.   GI  No heartburn, indigestion, abdominal pain, nausea, vomiting, diarrhea, change in bowel habits, loss of appetite, bloody stools.   Resp: + shortness of breath with exertion or at rest.  + excess mucus, no productive cough,  No non-productive cough,  No coughing up of blood.  No change in color of mucus.  No wheezing.  No chest wall deformity  Skin: no rash or lesions.  GU: no dysuria, change in color of urine, no urgency or frequency.  No flank pain, no hematuria   MS:  + joint pain or swelling.  No decreased range of motion.  No back pain.  Psych:  No change in mood or affect. No depression or anxiety.  No memory loss.   Vital Signs BP 144/78 mmHg  Pulse 71  Ht 5\' 6"  (1.676 m)  Wt 260 lb (117.935 kg)  BMI 41.99 kg/m2  SpO2 99%   Physical Exam:  General- No  distress,  A&Ox3, obese ENT: No sinus tenderness, TM clear, pale nasal mucosa, no oral exudate,no post nasal drip, no LAN Cardiac: S1, S2, regular rate and rhythm, no murmur Chest: No wheeze/ ++rales 2/3 way up bilaterally/ dullness; no accessory muscle use, no nasal flaring, no sternal retractions Abd.: Soft Non-tender Ext: No clubbing cyanosis, edema Neuro:  normal strength Skin: No rashes, warm and dry Psych: normal mood and behavior  Assessment/Plan  UIP (usual interstitial pneumonitis) (HCC) Stable interval for her UIP Plan: We will do CBC, BMET and liver function tests today. Continue your Cellcept 500 mg daily Continue your Pirfenidone Continue your oxygen at 3L at rest and 6-8 L with exertion. Follow up in 3 months with Dr. [name] Please contact office for sooner follow up if symptoms do not improve or worsen or seek emergency care      Magdalen Spatz, NP 01/07/2016  3:21 PM

## 2016-01-07 NOTE — Addendum Note (Signed)
Addended by: Len Blalock on: 01/07/2016 03:29 PM   Modules accepted: Orders

## 2016-01-07 NOTE — Progress Notes (Signed)
Attending:  I have seen and examined the patient with Sarah gross and I agree with the findings from her note.  Nellene has been doing well. She had a minor sinus issue recently which was related to allergies. She has not been active but not complaining of too much dyspnea.  On exam O2 saturation 99% on 3L [place]  Crackles 1/2 way up bilaterally, normal effort  UIP:  Continue pifenidone, cellcept as prescribed check LFT CBC today  F/u with 6 min walk next visit and PFT 4 months  Roselie Awkward, MD [place] [facility] Pager: [phone] Cell: [phone] After 3pm or if no response, call [phone]

## 2016-01-07 NOTE — Assessment & Plan Note (Signed)
Stable interval for her UIP Plan: We will do CBC, BMET and liver function tests today. Continue your Cellcept 500 mg daily Continue your Pirfenidone Continue your oxygen at 3L at rest and 6-8 L with exertion. Follow up in 3 months with Dr. [name] Please contact office for sooner follow up if symptoms do not improve or worsen or seek emergency care

## 2016-01-08 NOTE — Telephone Encounter (Signed)
ATC pt daughter [name], no vmail set up. wcb

## 2016-01-09 NOTE — Telephone Encounter (Signed)
Attempted to contact pt's daughter. No answer, voicemail is not set up. Will try back.

## 2016-01-11 NOTE — Telephone Encounter (Signed)
Called and spoke with patient, advised her that medication has been approved. She said that she discussed this with the provider when she was here this week. Nothing further needed.

## 2016-01-23 ENCOUNTER — Other Ambulatory Visit (INDEPENDENT_AMBULATORY_CARE_PROVIDER_SITE_OTHER): Payer: Medicare Other

## 2016-01-23 ENCOUNTER — Other Ambulatory Visit: Payer: Self-pay | Admitting: Family Medicine

## 2016-01-23 DIAGNOSIS — E1165 Type 2 diabetes mellitus with hyperglycemia: Secondary | ICD-10-CM

## 2016-01-23 DIAGNOSIS — IMO0001 Reserved for inherently not codable concepts without codable children: Secondary | ICD-10-CM

## 2016-01-23 DIAGNOSIS — I10 Essential (primary) hypertension: Secondary | ICD-10-CM

## 2016-01-23 DIAGNOSIS — E039 Hypothyroidism, unspecified: Secondary | ICD-10-CM

## 2016-01-23 DIAGNOSIS — Z1159 Encounter for screening for other viral diseases: Secondary | ICD-10-CM

## 2016-01-23 DIAGNOSIS — E7800 Pure hypercholesterolemia, unspecified: Secondary | ICD-10-CM

## 2016-01-23 LAB — LIPID PANEL
CHOLESTEROL: 203 mg/dL — AB (ref 0–200)
HDL: 56 mg/dL (ref >39.00)
LDL Cholesterol: 114 mg/dL — ABNORMAL HIGH (ref 0–99)
NONHDL: 146.85
Total CHOL/HDL Ratio: 4
Triglycerides: 165 mg/dL — ABNORMAL HIGH (ref 0.0–149.0)
VLDL: 33 mg/dL (ref 0.0–40.0)

## 2016-01-23 LAB — TSH: TSH: 6.3 u[IU]/mL — ABNORMAL HIGH (ref 0.35–4.50)

## 2016-01-23 LAB — HEMOGLOBIN A1C: HEMOGLOBIN A1C: 6.7 % — AB (ref 4.6–6.5)

## 2016-01-24 LAB — HEPATITIS C ANTIBODY: HCV Ab: NEGATIVE

## 2016-01-29 ENCOUNTER — Ambulatory Visit (INDEPENDENT_AMBULATORY_CARE_PROVIDER_SITE_OTHER): Payer: Medicare Other | Admitting: Family Medicine

## 2016-01-29 ENCOUNTER — Encounter: Payer: Self-pay | Admitting: Family Medicine

## 2016-01-29 VITALS — BP 116/70 | HR 70 | Temp 97.6°F | Ht 66.0 in | Wt 266.8 lb

## 2016-01-29 DIAGNOSIS — E039 Hypothyroidism, unspecified: Secondary | ICD-10-CM | POA: Diagnosis not present

## 2016-01-29 DIAGNOSIS — I10 Essential (primary) hypertension: Secondary | ICD-10-CM

## 2016-01-29 DIAGNOSIS — E7800 Pure hypercholesterolemia, unspecified: Secondary | ICD-10-CM

## 2016-01-29 DIAGNOSIS — Z0000 Encounter for general adult medical examination without abnormal findings: Secondary | ICD-10-CM | POA: Diagnosis not present

## 2016-01-29 DIAGNOSIS — R21 Rash and other nonspecific skin eruption: Secondary | ICD-10-CM

## 2016-01-29 DIAGNOSIS — M5431 Sciatica, right side: Secondary | ICD-10-CM

## 2016-01-29 DIAGNOSIS — J84112 Idiopathic pulmonary fibrosis: Secondary | ICD-10-CM

## 2016-01-29 DIAGNOSIS — E1165 Type 2 diabetes mellitus with hyperglycemia: Secondary | ICD-10-CM | POA: Diagnosis not present

## 2016-01-29 DIAGNOSIS — IMO0001 Reserved for inherently not codable concepts without codable children: Secondary | ICD-10-CM

## 2016-01-29 LAB — HM DIABETES FOOT EXAM

## 2016-01-29 LAB — T3, FREE: T3, Free: 2.5 pg/mL (ref 2.3–4.2)

## 2016-01-29 LAB — T4, FREE: Free T4: 1.14 ng/dL (ref 0.60–1.60)

## 2016-01-29 MED ORDER — NYSTATIN 100000 UNIT/GM EX CREA: 1.0000 "application " | TOPICAL_CREAM | Freq: Two times a day (BID) | CUTANEOUS | Status: DC

## 2016-01-29 NOTE — Progress Notes (Signed)
I have personally reviewed the Medicare Annual Wellness questionnaire and have noted 1. The patient's medical and social history 2. Their use of alcohol, tobacco or illicit drugs 3. Their current medications and supplements 4. The patient's functional ability including ADL's, fall risks, home safety risks and hearing or visual             impairment. 5. Diet and physical activities 6. Evidence for depression or mood disorders 7.         Updated provider list Cognitive evaluation was performed and recorded on pt medicare questionnaire form. The patients weight, height, BMI and visual acuity have been recorded in the chart  I have made referrals, counseling and provided education to the patient based review of the above and I have provided the pt with a written personalized care plan for preventive services.   65 year old female on continuous oxygen for UIP/IPF presents for AMW..  Diabetes: Good control, improved. On Glucotrol 10 mg, and 5 mg of onglyza, actos 45. SE to metformin in past. Lab Results  Component Value Date   HGBA1C 6.7* 01/23/2016  Using medications without difficulties:None Hypoglycemic episodes:None Hyperglycemic episodes:None Feet problems:None Blood Sugars averaging: FBS 120-150, lower than previously eye exam within last year: yes  . Wt Readings from Last 3 Encounters:  01/29/16 266 lb 12 oz (120.997 kg)  01/07/16 260 lb (117.935 kg)  12/11/15 266 lb 8 oz (120.884 kg)   She has stopped soda, drinking water, still skipping meals and eating carbs.  Elevated Cholesterol: Improved but not  goal on pravastatin 20 mg daily.  In past atorvastatin caused muscle ache.  Lab Results  Component Value Date   CHOL 203* 01/23/2016   HDL 56.00 01/23/2016   LDLCALC 114* 01/23/2016   LDLDIRECT 173.0 11/30/2014   TRIG 165.0* 01/23/2016   CHOLHDL 4 01/23/2016  Using medications without problems: Muscle aches: NONE on this dose of pravastatin Diet  compliance: Improving Exercise: Plans on starting water exercise Other complaints:  Hypertension:  Well controlled on current regimen. BP Readings from Last 3 Encounters:  01/29/16 116/70  01/07/16 144/78  12/11/15 120/74  Using medication without problems or lightheadedness: NUone Chest pain with exertion:None Edema:occ Short of breath:stable Average home BPs: 120/70s Other issues:   Hypothyroid: She has noted she is more tired lately.  TSH is elevated.  Social History /Family History/Past Medical History reviewed and updated if needed.  Review of Systems  Constitutional: Negative for fever and fatigue.  HENT: Negative for ear pain.  Eyes: Negative for pain.  Respiratory: Negative for chest tightness and shortness of breath.  Cardiovascular: Negative for chest pain, palpitations and leg swelling.  Gastrointestinal: Negative for abdominal pain.  Genitourinary: Negative for dysuria.      Objective:  Physical Exam  Constitutional: Vital signs are normal. She appears well-developed and well-nourished. She is cooperative. Non-toxic appearance. She does not appear ill. No distress.  HENT:  Head: Normocephalic.  Right Ear: Hearing, tympanic membrane, external ear and ear canal normal. Tympanic membrane is not erythematous, not retracted and not bulging.  Left Ear: Hearing, tympanic membrane, external ear and ear canal normal. Tympanic membrane is not erythematous, not retracted and not bulging.  Nose: No mucosal edema or rhinorrhea. Right sinus exhibits no maxillary sinus tenderness and no frontal sinus tenderness. Left sinus exhibits no maxillary sinus tenderness and no frontal sinus tenderness.  Mouth/Throat: Uvula is midline, oropharynx is clear and moist and mucous membranes are normal.  Eyes: Conjunctivae, EOM and lids are  normal. Pupils are equal, round, and reactive to light. Lids are everted and swept, no foreign bodies found.  Neck: Trachea normal  and normal range of motion. Neck supple. Carotid bruit is not present. No thyroid mass and no thyromegaly present.  Cardiovascular: Normal rate, regular rhythm, S1 normal, S2 normal, normal heart sounds, intact distal pulses and normal pulses. Exam reveals no gallop and no friction rub.  No murmur heard. No swelling.  Pulmonary/Chest: Effort normal. No tachypnea. No respiratory distress. She has decreased breath sounds. She has no wheezes. She has no rhonchi. She has no rales.  Abdominal: Soft. Normal appearance and bowel sounds are normal. There is no tenderness.  Neurological: She is alert.  Skin: Skin is warm, dry and intact. No rash noted.  Psychiatric: Her speech is normal and behavior is normal. Judgment and thought content normal. Her mood appears not anxious. Cognition and memory are normal. She does not exhibit a depressed mood.  Normal introitus for age, no external lesions, no vaginal discharge, mucosa pink and moist, no vaginal or cervical lesions, no vaginal atrophy, no friaility or hemorrhage, normal uterus size and position, no adnexal masses or tenderness.  Chaperoned exam.  Breast exam: No mass, nodules, thickening, tenderness, bulging, retraction, inflamation, nipple discharge or skin changes noted.  No axillary or clavicular LA.  Chaperoned exam.   Diabetic foot exam: Normal inspection No skin breakdown No calluses  Normal DP pulses Normal sensation to light touch and monofilament Nails normal                             ASSESSMENT AND PLAN:  The patient's preventative maintenance and recommended screening tests for an annual wellness exam were reviewed in full today. Brought up to date unless services declined.  Counselled on the importance of diet, exercise, and its role in overall health and mortality. The patient's FH and SH was reviewed, including their home life, tobacco status, and drug and alcohol status.   Vaccines: Will look  into TDap  Colon:  2014, Dr. Hilarie Fredrickson, sessile polyps, recommended repeat in 3 year.  PAP/DVE: Pap not indicated.  Daughter with ovarian cancer.  Nonsmoker HIV: refused  mammo: nml 06/2016

## 2016-01-29 NOTE — Assessment & Plan Note (Signed)
Well controlled. Continue current medication.  

## 2016-01-29 NOTE — Assessment & Plan Note (Signed)
Concerning area on right anterior calf.. Seb k versus squamous cell CA. Recommned shave biopsy at later date.

## 2016-01-29 NOTE — Assessment & Plan Note (Signed)
Using diclofenac prn.

## 2016-01-29 NOTE — Assessment & Plan Note (Signed)
Not at goal  Ut improving. Work on lifestyle changes and low chol diet.

## 2016-01-29 NOTE — Assessment & Plan Note (Signed)
Check free t3 and free t4 as TSH elevated.

## 2016-01-29 NOTE — Patient Instructions (Addendum)
Stop at lab on way out.  Get TDap ( tetanus, diptheria and pertussis) at pharmacy.  Plan colonoscopy after 05/2016.  Complete living will and HCPOA information packet.  Make an appointment for shave biopsy of lesion on right calf.

## 2016-01-29 NOTE — Assessment & Plan Note (Signed)
Followed by pulm. On oxygen.

## 2016-01-29 NOTE — Assessment & Plan Note (Signed)
Continue current meds and lifestyle cahnges

## 2016-01-29 NOTE — Progress Notes (Signed)
Pre visit review using our clinic review tool, if applicable. No additional management support is needed unless otherwise documented below in the visit note. 

## 2016-02-02 DIAGNOSIS — J8489 Other specified interstitial pulmonary diseases: Secondary | ICD-10-CM | POA: Diagnosis not present

## 2016-02-15 ENCOUNTER — Encounter: Payer: Self-pay | Admitting: Family Medicine

## 2016-02-15 ENCOUNTER — Ambulatory Visit (INDEPENDENT_AMBULATORY_CARE_PROVIDER_SITE_OTHER): Payer: Medicare Other | Admitting: Family Medicine

## 2016-02-15 VITALS — BP 110/74 | HR 82 | Temp 97.8°F | Ht 66.0 in | Wt 268.5 lb

## 2016-02-15 DIAGNOSIS — L570 Actinic keratosis: Secondary | ICD-10-CM | POA: Diagnosis not present

## 2016-02-15 DIAGNOSIS — L989 Disorder of the skin and subcutaneous tissue, unspecified: Secondary | ICD-10-CM | POA: Diagnosis not present

## 2016-02-15 NOTE — Progress Notes (Signed)
Shave biopsy  Meds, vitals, and allergies reviewed.   Indication: suspicious lesion  Location: right anterior lower leg  Size:0.8 cm  Verbal informed consent obtained.  Pt aware of risks not limited to but including infection,  bleeding, damage to near by organs.  Prep: etoh/betadine  Anesthesia: 1%lidocaine with epi, good effect  Shave made with dermablade Repeated cautery and curettage x 3   Minimal oozing, controlled withOUT silver nitrate  Tolerated well  Routine postprocedure instructions d/w pt- keep area clean and bandaged, follow up if concerns/spreading erythema/pain.

## 2016-02-15 NOTE — Progress Notes (Signed)
Pre visit review using our clinic review tool, if applicable. No additional management support is needed unless otherwise documented below in the visit note. 

## 2016-03-04 ENCOUNTER — Encounter: Payer: Self-pay | Admitting: *Deleted

## 2016-03-04 DIAGNOSIS — J8489 Other specified interstitial pulmonary diseases: Secondary | ICD-10-CM | POA: Diagnosis not present

## 2016-03-10 ENCOUNTER — Other Ambulatory Visit: Payer: Self-pay | Admitting: Family Medicine

## 2016-03-25 ENCOUNTER — Telehealth: Payer: Self-pay

## 2016-03-25 ENCOUNTER — Other Ambulatory Visit: Payer: Self-pay | Admitting: Family Medicine

## 2016-03-25 DIAGNOSIS — J3089 Other allergic rhinitis: Secondary | ICD-10-CM

## 2016-03-25 MED ORDER — FLUCONAZOLE 50 MG PO TABS
50.0000 mg | ORAL_TABLET | Freq: Every day | ORAL | 0 refills | Status: AC
Start: 2016-03-25 — End: 2016-04-08

## 2016-03-25 NOTE — Telephone Encounter (Signed)
Sent in daily antifungal to take for 2 weeks for refractory candidal infeciton.. SE can be diarrhea, upset stomach.. Call if not tolerating.

## 2016-03-25 NOTE — Telephone Encounter (Signed)
Left message for Telena to return my call.

## 2016-03-25 NOTE — Telephone Encounter (Signed)
Ms. Lindau notified as instructed by telephone.

## 2016-03-25 NOTE — Telephone Encounter (Signed)
Pt returned call -please call [phone]

## 2016-03-25 NOTE — Telephone Encounter (Signed)
Pt left v/m; pt seen 01/29/16 and given nystatin cream to go on rash under breast and stomach; pt has been using cream but not effective and wants to know if could get nystatin tablets.pt request cb. [place] [facility].

## 2016-03-28 ENCOUNTER — Other Ambulatory Visit: Payer: Self-pay | Admitting: Internal Medicine

## 2016-03-28 ENCOUNTER — Telehealth: Payer: Self-pay | Admitting: *Deleted

## 2016-03-28 DIAGNOSIS — Z86010 Personal history of colonic polyps: Secondary | ICD-10-CM

## 2016-03-28 NOTE — Telephone Encounter (Signed)
I have contacted patient regarding recall colonoscopy. Per Dr Hilarie Fredrickson, she is now due for repeat colon for history of colon polyps. Patinet has agreed to go forward with scheduling procedure. Patient should be scheduled at [facility] endoscopy (on O2) 06/17/16 and for previsit. Patient will need double prep.

## 2016-03-31 NOTE — Telephone Encounter (Signed)
Pt scheduled for Colon at [facility] 06/17/16@11 :45am. Pt scheduled for previsit 06/03/16@11am . Pt aware of appts.

## 2016-04-04 DIAGNOSIS — J8489 Other specified interstitial pulmonary diseases: Secondary | ICD-10-CM | POA: Diagnosis not present

## 2016-04-10 DIAGNOSIS — E119 Type 2 diabetes mellitus without complications: Secondary | ICD-10-CM | POA: Diagnosis not present

## 2016-04-10 DIAGNOSIS — I10 Essential (primary) hypertension: Secondary | ICD-10-CM | POA: Diagnosis not present

## 2016-04-10 DIAGNOSIS — H35033 Hypertensive retinopathy, bilateral: Secondary | ICD-10-CM | POA: Diagnosis not present

## 2016-04-10 LAB — HM DIABETES EYE EXAM

## 2016-04-14 ENCOUNTER — Telehealth: Payer: Self-pay | Admitting: Family Medicine

## 2016-04-14 NOTE — Telephone Encounter (Signed)
Pt dropped off jury summons and is requesting dismissal due to oxygen/tank. Please call when ready for p/u. I placed in Rx tower.

## 2016-04-21 NOTE — Telephone Encounter (Signed)
Patient called to find out if paperwork has been done for jury duty.  Please call patient back at [phone] when it's ready.  Patient said she has to have the paperwork back by this week.

## 2016-04-22 ENCOUNTER — Encounter: Payer: Self-pay | Admitting: Family Medicine

## 2016-04-22 DIAGNOSIS — Z7689 Persons encountering health services in other specified circumstances: Secondary | ICD-10-CM

## 2016-04-22 NOTE — Telephone Encounter (Signed)
Left message for Kaitlin Hamilton that her [name] Duty Letter is ready to be picked up at the front desk.

## 2016-04-30 ENCOUNTER — Telehealth: Payer: Self-pay

## 2016-04-30 MED ORDER — NYSTATIN 100000 UNIT/GM EX POWD: Freq: Four times a day (QID) | CUTANEOUS | 1 refills | Status: DC

## 2016-04-30 NOTE — Telephone Encounter (Signed)
Pt perspires a lot at night and pt has broken out under breast again. Pt is requesting nystatin powder; advised pt per 03/25/16 pt requested different med because nystatin cream not helping; pt thinks nystatin powder will help. [address]

## 2016-04-30 NOTE — Telephone Encounter (Signed)
Sent in rx.

## 2016-05-01 ENCOUNTER — Telehealth: Payer: Self-pay | Admitting: Family Medicine

## 2016-05-01 NOTE — Telephone Encounter (Signed)
Pt dropped off [employer] form to be filled out Paperwork in rx tower for dr [employer]

## 2016-05-01 NOTE — Telephone Encounter (Signed)
Ms. Farr notified prescription for Nystatin Powder has been sent to her pharmacy.

## 2016-05-01 NOTE — Telephone Encounter (Signed)
[employer] Form placed in Dr. Rometta Emery in box to complete.

## 2016-05-02 DIAGNOSIS — Z7689 Persons encountering health services in other specified circumstances: Secondary | ICD-10-CM

## 2016-05-02 NOTE — Telephone Encounter (Signed)
Complete in outbox. 

## 2016-05-04 DIAGNOSIS — J8489 Other specified interstitial pulmonary diseases: Secondary | ICD-10-CM | POA: Diagnosis not present

## 2016-05-05 NOTE — Telephone Encounter (Signed)
Ms. Salado notified [employer] paperwork is ready to be picked up.. Patient ask that we fax the forms.  Forms faxed to [place] at [phone].

## 2016-05-07 DIAGNOSIS — G4733 Obstructive sleep apnea (adult) (pediatric): Secondary | ICD-10-CM | POA: Diagnosis not present

## 2016-05-07 DIAGNOSIS — J84111 Idiopathic interstitial pneumonia, not otherwise specified: Secondary | ICD-10-CM | POA: Diagnosis not present

## 2016-05-07 DIAGNOSIS — J8489 Other specified interstitial pulmonary diseases: Secondary | ICD-10-CM | POA: Diagnosis not present

## 2016-05-13 ENCOUNTER — Telehealth: Payer: Self-pay | Admitting: *Deleted

## 2016-05-13 ENCOUNTER — Encounter: Payer: Self-pay | Admitting: Pulmonary Disease

## 2016-05-13 ENCOUNTER — Ambulatory Visit (INDEPENDENT_AMBULATORY_CARE_PROVIDER_SITE_OTHER): Payer: Medicare Other | Admitting: Pulmonary Disease

## 2016-05-13 ENCOUNTER — Other Ambulatory Visit (INDEPENDENT_AMBULATORY_CARE_PROVIDER_SITE_OTHER): Payer: Medicare Other

## 2016-05-13 DIAGNOSIS — J84112 Idiopathic pulmonary fibrosis: Secondary | ICD-10-CM

## 2016-05-13 DIAGNOSIS — G4733 Obstructive sleep apnea (adult) (pediatric): Secondary | ICD-10-CM

## 2016-05-13 DIAGNOSIS — Z5181 Encounter for therapeutic drug level monitoring: Secondary | ICD-10-CM

## 2016-05-13 DIAGNOSIS — K224 Dyskinesia of esophagus: Secondary | ICD-10-CM

## 2016-05-13 DIAGNOSIS — J9611 Chronic respiratory failure with hypoxia: Secondary | ICD-10-CM

## 2016-05-13 LAB — COMPREHENSIVE METABOLIC PANEL
ALBUMIN: 4.3 g/dL (ref 3.5–5.2)
ALT: 13 U/L (ref 0–35)
AST: 13 U/L (ref 0–37)
Alkaline Phosphatase: 81 U/L (ref 39–117)
BUN: 16 mg/dL (ref 6–23)
CHLORIDE: 99 meq/L (ref 96–112)
CO2: 32 mEq/L (ref 19–32)
CREATININE: 0.79 mg/dL (ref 0.40–1.20)
Calcium: 9.7 mg/dL (ref 8.4–10.5)
GFR: 77.52 mL/min (ref >60.00)
GLUCOSE: 225 mg/dL — AB (ref 70–99)
POTASSIUM: 4.5 meq/L (ref 3.5–5.1)
SODIUM: 138 meq/L (ref 135–145)
TOTAL PROTEIN: 7.3 g/dL (ref 6.0–8.3)
Total Bilirubin: 0.4 mg/dL (ref 0.2–1.2)

## 2016-05-13 LAB — CBC WITH DIFFERENTIAL/PLATELET
Basophils Absolute: 0 10*3/uL (ref 0.0–0.1)
Basophils Relative: 0.4 % (ref 0.0–3.0)
EOS ABS: 0.2 10*3/uL (ref 0.0–0.7)
EOS PCT: 2.3 % (ref 0.0–5.0)
HCT: 37.9 % (ref 36.0–46.0)
HEMOGLOBIN: 12.8 g/dL (ref 12.0–15.0)
LYMPHS ABS: 1.7 10*3/uL (ref 0.7–4.0)
Lymphocytes Relative: 22.2 % (ref 12.0–46.0)
MCHC: 33.7 g/dL (ref 30.0–36.0)
MCV: 88.6 fl (ref 78.0–100.0)
MONO ABS: 0.4 10*3/uL (ref 0.1–1.0)
Monocytes Relative: 4.7 % (ref 3.0–12.0)
NEUTROS PCT: 70.4 % (ref 43.0–77.0)
Neutro Abs: 5.3 10*3/uL (ref 1.4–7.7)
Platelets: 250 10*3/uL (ref 150.0–400.0)
RBC: 4.28 Mil/uL (ref 3.87–5.11)
RDW: 14.8 % (ref 11.5–15.5)
WBC: 7.5 10*3/uL (ref 4.0–10.5)

## 2016-05-13 LAB — HEPATIC FUNCTION PANEL
ALBUMIN: 4.4 g/dL (ref 3.5–5.2)
ALK PHOS: 85 U/L (ref 39–117)
ALT: 13 U/L (ref 0–35)
AST: 13 U/L (ref 0–37)
Bilirubin, Direct: 0 mg/dL (ref 0.0–0.3)
TOTAL PROTEIN: 7.4 g/dL (ref 6.0–8.3)
Total Bilirubin: 0.4 mg/dL (ref 0.2–1.2)

## 2016-05-13 NOTE — Telephone Encounter (Signed)
Dr Hilarie Fredrickson has reviewed patient's recall endoscopy information and has determined that it is time for her to schedule endoscopy for barretts surveillance. Since she is already scheduled 06-17-16 for colonoscopy at hospital (02 dependent), We will add endoscopy and change date to 07-04-16 (06-17-16 schedule is too full to add endoscopy). Patient is agreeable to this and will have instructions given to her on 06/03/16 at her previsit. I have rescheduled procedure and added endoscopy with [facility] ([place] staff already gone for today).

## 2016-05-13 NOTE — Progress Notes (Signed)
Subjective:    Patient ID: Kaitlin Hamilton, female    DOB: 07-Sep-1950, 65 y.o.   MRN: [id number]  Synopsis: Kaitlin Hamilton is a very pleasant 65 year old female who first saw the [facility] pulmonary clinic in March 2014 for evaluation of shortness of breath. She had significant cough, wheezing, and sputum production requiring multiple rounds of antibiotics. Because of crackles on lung exam and an abnormal pulmonary function test she was referred to [place]. We ordered full pulmonary function testing which showed moderate restriction and a depressed DLCO in proportion to her restriction. There is no airflow obstruction and no change with bronchodilator administration. A chest x-ray performed in February 2014 was read as normal.  An open lung biopsy was performed in December 2014 showing UIP.  Because she has had multiple good responses to steroids, we have treated her with cellcept and prednisone.  In January 2015 she had severe insomnia, hallucinations and gastritis on high dose prednisone.  She had a rash to bactrim. Since February 2015 she has been on cellcept and low dose prednisone and dapsone. Because of progression in hypoxemia and lung function testing in 2016 we changed her therapy to anti-fibrotic therapy with Esbriet. Because of a strong concern for an underlying connective tissue disease it was decided that she will be maintained on low-dose CellCept.  HPI Chief Complaint  Patient presents with  . Follow-up    pt c/o increased cough when eating, worsening sob, chest tightness.     [name] says that she has been doing OK.  She has a lot of friends that have an upper airway infection right now, but she has been able to avoid it.   She had a flu shot August 23 this year.  She has been using her oxygen more when she eats because she is coughing more.  She doesn't feel like she is coughing more.  She doesn't necessarily cough up anything. She remains active with doing the laundry, cleaning her house,  and going to the grocery store.  These acitivities will make her have chest heaviness and dyspnea.  The oxygen helps this.  Resting helps.  She will turn up her oxygen to 5L [place] when she exerts herself.    Past Medical History:  Diagnosis Date  . Allergic rhinitis    uses Flonase daily  . Anxiety   . Bronchitis   . Chicken pox   . Constipation    takes Fiber daily  . Depression   . Diverticulosis   . Dysphagia   . GERD (gastroesophageal reflux disease)    takes Protonix daily  . H/O hiatal hernia   . Hemorrhoids   . History of bronchitis    2013  . History of colon polyps   . History of kidney stones   . Hyperlipidemia    lost 43 pounds and no meds required at present  . Hypertension    takes Verapamil and HCTZ daily  . Hypokalemia   . Hypothyroidism (acquired)    takes Synthroid daily  . Insomnia    doesn't take any meds for this  . Joint swelling    left thumb  . Migraines    last one about a month ago  . Non-insulin dependent type 2 diabetes mellitus ([place])    takes Glipizide daily  . OA (osteoarthritis)    left knee  . Pneumonia    last time in 2006  . PONV (postoperative nausea and vomiting)   . Shortness of breath  with exertion;takes Singulair daily as well as Flonase  . Urinary frequency         Review of Systems  Constitutional: Negative for activity change, chills, fatigue and fever.  HENT: Negative for congestion, ear pain, nosebleeds, postnasal drip, rhinorrhea and sinus pressure.   Respiratory: Positive for shortness of breath. Negative for cough and wheezing.   Cardiovascular: Negative for chest pain, palpitations and leg swelling.  Gastrointestinal: Negative for abdominal pain and nausea.       Objective:   Physical Exam  Vitals:   05/13/16 1325  BP: 126/68  Cuff Size: Normal  Pulse: 80  SpO2: 100%  Weight: 275 lb (124.7 kg)  Height: '5\' 6"'  (1.676 m)  3L [place]   Gen:  no acute distress  HEENT: NCAT,  EOMi, OP clear,  PULM:  Crackles 1/2 way up bilaterally CV: RRR, no mgr, no JVD AB: BS+, soft, nontender, no hsm Ext: warm, no edema, no clubbing Neuro: A&Ox4, maew   February 2014 simple spirometry performed by her primary care physician>> ratio 90%, FEV1 1.71 L (64% predicted, FVC 1.83 L 55% predicted; flow volume loop is not consistent with obstruction February 2014 chest x-ray at [facility] normal 10/19/2012 walked 500 feet in office on room air oxygenation did not drop below 90% 11/15/2012 Full PFT [name]> Ratio 87%, FEV1 2.00L > 2.07 with bronchodilator (3% change); TLC 3.44 L (65% pred), ERV 0.62 (57% pred), DLCO 15.1 ml/mmHg/min (59% pred) 11/2012 CT chest >> ([place] read) centrilobular nodules, interlobular septal thickening worse in bases and periphery R lung > L; some GGO in bases and periphery as well, some bronchiectasis in the bases R > L; findings suggestive of fibrosis but not UIP; question hypersensitivity pneumonitis given centrilobular nodules; also question aspiration 03/2013 ANA, ANCA, Anti-Jo-1, ESR, RF, SCL-70, anti-centromere, SSA/SSB, all negative; CRP 0.6;  04/2013 Barium swallow> abnormal esophageal motility, GERD, hiatal hernia 04/2013 Full PFT> Ratio 93%, FEV1 1.92 L (71% pred), TLC 3.02L (56% pred), DLCO 15.94 (59% pred) 04/2013 CT chest ([name])> findings suggestive of but not diagnostic of NSIP, small pulmonary nodule 04/2013 6MW RA > 1100 feet, HR peak 109, O2 sat Nad[place] 87% 04/2013 HP panel >> 05/17/2013 3 month prednisone trial 06/2013 open lung biopsy> UIP 07/28/13 Cell cept and bactrim started > bactrim stopped 07/29/13 due to rash  07/2013 hospitalized for confusion > prednisone 07/2013 hospitalized for gastritis/abdominal pain> prednisone 08/22/2013 atovaquone caused rash 11/2013 6MW 1364 feet, O2 saturation nadir 78% May 2015 full pulmonary function test ratio 93%, FEV1 1.71 L (64% predicted, no change with bronchodilator), total lung capacity 2.67 L (49% predicted), DLCO 13.11 (40%  predicted) February 2016 CT chest high resolution> slight progression in reticular abnormalities consistent with pulmonary fibrosis, uip 08/21/14 6MW 1400 feet 90% on 8L 03/2015 PFT > Ratio 93%, FEV1 1.24L, FVC 1.33L (38% pred), TLC 2.56L (47% pred), DLCO 12.10 (44% pred) March 2017 pulmonary function testing ratio 93%, FVC (41% predicted), total lung capacity 2.34 L (43% protected), DLCO 10.53 (39% protected).     Assessment & Plan:   UIP (usual interstitial pneumonitis) (HCC) This has been a stable interval for [facility]. She has not had an exacerbation since the last visit.  She remains compliant with her Esbriet and cellcept.    Plan Continue cellcept and Esbriet as ordered PFT and 6 MW now Check LFT, CBC, renal function to monitor for drug toxicity Follow-up 4 months She was counseled today on weight loss considering her significant weight gain recently. She is advised  to count calories, exercise more, weight herself daily. Flu shot is up-to-date.  Esophageal dysmotility No changes recently. Continue follow-up with GI.  OSA (obstructive sleep apnea) Continue CPAP nightly as ordered.  Chronic hypoxemic respiratory failure (HCC) We will perform an ambulatory walk test today in the office to ensure that 5 L of oxygen with exertion is enough. Otherwise she will continue with 3 arrest, filed with exertion.  > 50% of time spent face to face in a 30 minute visit  Updated Medication List Outpatient Encounter Prescriptions as of 05/13/2016  Medication Sig Dispense Refill  . acetaminophen (TYLENOL) 500 MG tablet Take 1,000 mg by mouth every 6 (six) hours as needed for moderate pain.    Marland Kitchen diclofenac (VOLTAREN) 75 MG EC tablet Take 1 tablet by mouth two  times daily 180 tablet 0  . diphenhydrAMINE (BENADRYL) 25 mg capsule Take 25 mg by mouth as needed.    Marland Kitchen FIBER PO Take 1 tablet by mouth daily.    . fluticasone (FLONASE) 50 MCG/ACT nasal spray Place 1 spray into both nostrils daily.    Marland Kitchen  glipiZIDE (GLUCOTROL) 10 MG tablet Take 1 tablet (10 mg total) by mouth 2 (two) times daily before a meal. 180 tablet 1  . guaifenesin (ROBITUSSIN) 100 MG/5ML syrup Take 10 mLs (200 mg total) by mouth 3 (three) times daily as needed for cough. 240 mL 0  . hydrocortisone cream 0.5 % Apply 1 application topically 2 (two) times daily as needed (for rash). 30 g 0  . levothyroxine (SYNTHROID, LEVOTHROID) 125 MCG tablet Take 1 tablet (125 mcg total) by mouth daily before breakfast. 90 tablet 1  . losartan-hydrochlorothiazide (HYZAAR) 50-12.5 MG tablet Take 1 tablet by mouth daily. 30 tablet 5  . montelukast (SINGULAIR) 10 MG tablet Take 1 tablet (10 mg total) by mouth at bedtime. 90 tablet 3  . mycophenolate (CELLCEPT) 500 MG tablet Take 1 tablet (500 mg total) by mouth daily. 180 tablet 3  . NON FORMULARY Place 3 L into the nose daily. 4-5 Liters with exertion    . nystatin (MYCOSTATIN/NYSTOP) powder Apply topically 4 (four) times daily. 15 g 1  . pantoprazole (PROTONIX) 40 MG tablet Take 1 tablet by mouth two  times daily 180 tablet 3  . pioglitazone (ACTOS) 45 MG tablet Take 1 tablet (45 mg total) by mouth daily. 30 tablet 5  . Pirfenidone 267 MG CAPS Take 3 capsules by mouth 3 (three) times daily. 270 capsule 11  . pravastatin (PRAVACHOL) 20 MG tablet Take 1 tablet (20 mg total) by mouth daily. 30 tablet 5  . saxagliptin HCl (ONGLYZA) 5 MG TABS tablet Take 1 tablet (5 mg total) by mouth daily. 90 tablet 1  . verapamil (CALAN-SR) 180 MG CR tablet 1 Tablet ER, Oral, daily 90 tablet 1   No facility-administered encounter medications on file as of 05/13/2016.

## 2016-05-13 NOTE — Patient Instructions (Signed)
We will arrange for a lung function test and a 6 minute walk Keep using her oxygen as you are doing Continue taking your Esbriet and CellCept We will call you with the results of your bloodwork Stay active, weight or self daily, write down all food that you eat We will follow-up in 4 months or sooner if needed

## 2016-05-13 NOTE — Assessment & Plan Note (Signed)
Continue CPAP nightly as ordered.

## 2016-05-13 NOTE — Assessment & Plan Note (Addendum)
We will perform an ambulatory walk test today in the office to ensure that 5 L of oxygen with exertion is enough. Otherwise she will continue with 3 arrest, filed with exertion.

## 2016-05-13 NOTE — Assessment & Plan Note (Signed)
No changes recently. Continue follow-up with GI.

## 2016-05-13 NOTE — Assessment & Plan Note (Addendum)
This has been a stable interval for [facility]. She has not had an exacerbation since the last visit.  She remains compliant with her Esbriet and cellcept.    Plan Continue cellcept and Esbriet as ordered PFT and 6 MW now Check LFT, CBC, renal function to monitor for drug toxicity Follow-up 4 months She was counseled today on weight loss considering her significant weight gain recently. She is advised to count calories, exercise more, weight herself daily. Flu shot is up-to-date.

## 2016-05-29 ENCOUNTER — Other Ambulatory Visit: Payer: Self-pay | Admitting: Family Medicine

## 2016-06-03 ENCOUNTER — Ambulatory Visit (AMBULATORY_SURGERY_CENTER): Payer: Medicare Other

## 2016-06-03 VITALS — Ht 66.0 in | Wt 275.0 lb

## 2016-06-03 DIAGNOSIS — Z86010 Personal history of colonic polyps: Secondary | ICD-10-CM

## 2016-06-03 DIAGNOSIS — K2270 Barrett's esophagus without dysplasia: Secondary | ICD-10-CM

## 2016-06-03 MED ORDER — NA SULFATE-K SULFATE-MG SULF 17.5-3.13-1.6 GM/177ML PO SOLN: ORAL | 0 refills | Status: DC

## 2016-06-03 NOTE — Progress Notes (Signed)
Per pt, no allergies to soy or egg products.Pt not taking any weight loss meds .  Pt is on continuous O2, at 3 liters!

## 2016-06-04 DIAGNOSIS — J8489 Other specified interstitial pulmonary diseases: Secondary | ICD-10-CM | POA: Diagnosis not present

## 2016-06-11 ENCOUNTER — Ambulatory Visit (HOSPITAL_COMMUNITY)
Admission: RE | Admit: 2016-06-11 | Discharge: 2016-06-11 | Disposition: A | Discharge status: Home / Self Care | Payer: Medicare Other | Source: Ambulatory Visit | Attending: Pulmonary Disease | Admitting: Pulmonary Disease

## 2016-06-11 ENCOUNTER — Ambulatory Visit (INDEPENDENT_AMBULATORY_CARE_PROVIDER_SITE_OTHER): Payer: Medicare Other | Admitting: Pulmonary Disease

## 2016-06-11 DIAGNOSIS — R0609 Other forms of dyspnea: Secondary | ICD-10-CM | POA: Diagnosis not present

## 2016-06-11 DIAGNOSIS — J84112 Idiopathic pulmonary fibrosis: Secondary | ICD-10-CM | POA: Insufficient documentation

## 2016-06-11 LAB — PULMONARY FUNCTION TEST
DL/VA % PRED: 76 %
DL/VA: 3.87 ml/min/mmHg/L
DLCO unc % pred: 21 %
DLCO unc: 5.89 ml/min/mmHg
FEF 25-75 Post: 2.41 L/sec
FEF 25-75 Pre: 2.21 L/sec
FEF2575-%CHANGE-POST: 9 %
FEF2575-%PRED-POST: 108 %
FEF2575-%Pred-Pre: 99 %
FEV1-%CHANGE-POST: 0 %
FEV1-%PRED-POST: 45 %
FEV1-%Pred-Pre: 45 %
FEV1-PRE: 1.17 L
FEV1-Post: 1.17 L
FEV1FVC-%CHANGE-POST: 2 %
FEV1FVC-%Pred-Pre: 117 %
FEV6-%Change-Post: -2 %
FEV6-%PRED-POST: 38 %
FEV6-%Pred-Pre: 39 %
FEV6-PRE: 1.3 L
FEV6-Post: 1.26 L
FEV6FVC-%Change-Post: 0 %
FEV6FVC-%PRED-PRE: 103 %
FEV6FVC-%Pred-Post: 104 %
FVC-%CHANGE-POST: -2 %
FVC-%PRED-POST: 37 %
FVC-%PRED-PRE: 38 %
FVC-POST: 1.26 L
FVC-PRE: 1.3 L
POST FEV1/FVC RATIO: 92 %
PRE FEV6/FVC RATIO: 100 %
Post FEV6/FVC ratio: 100 %
Pre FEV1/FVC ratio: 90 %
RV % PRED: 34 %
RV: 0.76 L
TLC % pred: 44 %
TLC: 2.37 L

## 2016-06-11 MED ORDER — ALBUTEROL SULFATE (2.5 MG/3ML) 0.083% IN NEBU
2.5000 mg | INHALATION_SOLUTION | Freq: Once | RESPIRATORY_TRACT | Status: AC
  Administered 2016-06-11: 2.5 mg via RESPIRATORY_TRACT

## 2016-06-16 ENCOUNTER — Telehealth: Payer: Self-pay | Admitting: Pulmonary Disease

## 2016-06-16 MED ORDER — MYCOPHENOLATE MOFETIL 500 MG PO TABS: 500.0000 mg | ORAL_TABLET | Freq: Every day | ORAL | 3 refills | Status: DC

## 2016-06-16 MED ORDER — PIRFENIDONE 267 MG PO CAPS: 3.0000 | ORAL_CAPSULE | Freq: Three times a day (TID) | ORAL | 11 refills | Status: DC

## 2016-06-16 NOTE — Telephone Encounter (Signed)
Called and spoke to pt. Pt is requesting refill of Cellcept and Esbriet. Rx sent to preferred pharmacy. Pt verbalized understanding and denied any further questions or concerns at this time.

## 2016-06-18 ENCOUNTER — Telehealth: Payer: Self-pay | Admitting: Pulmonary Disease

## 2016-06-18 NOTE — Telephone Encounter (Signed)
  Pt has colonoscopy and egd in December scheduled by PCP.  Pt's daughter is uncomfortable with this, and is requesting BQ's recs on this.    Pt would like to speak with BQ directly if possible.  BQ please advise.  Thanks.

## 2016-06-19 NOTE — Telephone Encounter (Signed)
Probably not necessary.  I'll call [name]

## 2016-06-20 ENCOUNTER — Telehealth: Payer: Self-pay | Admitting: Internal Medicine

## 2016-06-20 NOTE — Telephone Encounter (Signed)
Pt states Pulmonary doc wants to hold on procedures for now. See phone note dated 06/18/16. Procedures will be cancelled.

## 2016-06-20 NOTE — Telephone Encounter (Signed)
Left message for pt to call back  °

## 2016-06-20 NOTE — Telephone Encounter (Signed)
Noted.  Will close encounter, and send to BQ as a reminder.

## 2016-06-23 NOTE — Telephone Encounter (Signed)
Pt states Dr. [name] wants her to cancel her procedure, does not want her to be sedated unless she is having issues. Colon cancelled and Dr. Hilarie Fredrickson aware.

## 2016-06-23 NOTE — Telephone Encounter (Signed)
I spoke with [name] about this matter

## 2016-07-01 ENCOUNTER — Other Ambulatory Visit (INDEPENDENT_AMBULATORY_CARE_PROVIDER_SITE_OTHER): Payer: Medicare Other

## 2016-07-01 ENCOUNTER — Telehealth: Payer: Self-pay | Admitting: Family Medicine

## 2016-07-01 DIAGNOSIS — E1165 Type 2 diabetes mellitus with hyperglycemia: Principal | ICD-10-CM

## 2016-07-01 DIAGNOSIS — E039 Hypothyroidism, unspecified: Secondary | ICD-10-CM

## 2016-07-01 DIAGNOSIS — IMO0001 Reserved for inherently not codable concepts without codable children: Secondary | ICD-10-CM

## 2016-07-01 LAB — COMPREHENSIVE METABOLIC PANEL
ALT: 12 U/L (ref 0–35)
AST: 14 U/L (ref 0–37)
Albumin: 4.4 g/dL (ref 3.5–5.2)
Alkaline Phosphatase: 83 U/L (ref 39–117)
BILIRUBIN TOTAL: 0.4 mg/dL (ref 0.2–1.2)
BUN: 17 mg/dL (ref 6–23)
CHLORIDE: 99 meq/L (ref 96–112)
CO2: 32 meq/L (ref 19–32)
CREATININE: 0.85 mg/dL (ref 0.40–1.20)
Calcium: 9.8 mg/dL (ref 8.4–10.5)
GFR: 71.21 mL/min (ref >60.00)
GLUCOSE: 166 mg/dL — AB (ref 70–99)
Potassium: 4 mEq/L (ref 3.5–5.1)
SODIUM: 140 meq/L (ref 135–145)
Total Protein: 7.6 g/dL (ref 6.0–8.3)

## 2016-07-01 LAB — LIPID PANEL
CHOL/HDL RATIO: 4
CHOLESTEROL: 229 mg/dL — AB (ref 0–200)
HDL: 64.2 mg/dL (ref >39.00)
LDL CALC: 132 mg/dL — AB (ref 0–99)
NonHDL: 165
Triglycerides: 163 mg/dL — ABNORMAL HIGH (ref 0.0–149.0)
VLDL: 32.6 mg/dL (ref 0.0–40.0)

## 2016-07-01 LAB — HEMOGLOBIN A1C: Hgb A1c MFr Bld: 7.5 % — ABNORMAL HIGH (ref 4.6–6.5)

## 2016-07-01 NOTE — Telephone Encounter (Signed)
-----   Message from Ellamae Sia sent at 06/24/2016  3:14 PM EST ----- Regarding: Lab orders for Tuesday, 12.12.17 Lab orders for a f/u appt

## 2016-07-04 ENCOUNTER — Encounter (HOSPITAL_COMMUNITY): Payer: Self-pay

## 2016-07-04 ENCOUNTER — Ambulatory Visit (HOSPITAL_COMMUNITY): Admit: 2016-07-04 | Payer: Medicare Other | Admitting: Internal Medicine

## 2016-07-04 DIAGNOSIS — J8489 Other specified interstitial pulmonary diseases: Secondary | ICD-10-CM | POA: Diagnosis not present

## 2016-07-04 SURGERY — COLONOSCOPY WITH PROPOFOL
Anesthesia: Monitor Anesthesia Care

## 2016-07-07 ENCOUNTER — Telehealth: Payer: Self-pay | Admitting: Pulmonary Disease

## 2016-07-07 MED ORDER — MYCOPHENOLATE MOFETIL 500 MG PO TABS: 500.0000 mg | ORAL_TABLET | Freq: Every day | ORAL | 3 refills | Status: DC

## 2016-07-07 NOTE — Telephone Encounter (Signed)
Called and spoke to pharmacist at [facility]. He was questioning the amount of tablets, pt is taking 1 tablet daily and the previous rx was written for 180 tabs with 3 refills. Advised pharmacist to change rx to 90 tablets with 3 refills. He verbalized understanding and denied any further questions or concerns at this time.

## 2016-07-07 NOTE — Telephone Encounter (Signed)
pharm says this needs to be taken care of today.Kaitlin Hamilton'

## 2016-07-08 ENCOUNTER — Encounter: Payer: Self-pay | Admitting: Family Medicine

## 2016-07-08 ENCOUNTER — Ambulatory Visit (INDEPENDENT_AMBULATORY_CARE_PROVIDER_SITE_OTHER): Payer: Medicare Other | Admitting: Family Medicine

## 2016-07-08 ENCOUNTER — Ambulatory Visit (INDEPENDENT_AMBULATORY_CARE_PROVIDER_SITE_OTHER)
Admission: RE | Admit: 2016-07-08 | Discharge: 2016-07-08 | Disposition: A | Discharge status: Home / Self Care | Payer: Medicare Other | Source: Ambulatory Visit | Attending: Family Medicine | Admitting: Family Medicine

## 2016-07-08 VITALS — BP 120/80 | HR 77 | Temp 97.5°F | Ht 66.0 in | Wt 277.8 lb

## 2016-07-08 DIAGNOSIS — E1165 Type 2 diabetes mellitus with hyperglycemia: Secondary | ICD-10-CM | POA: Diagnosis not present

## 2016-07-08 DIAGNOSIS — M5431 Sciatica, right side: Secondary | ICD-10-CM

## 2016-07-08 DIAGNOSIS — IMO0001 Reserved for inherently not codable concepts without codable children: Secondary | ICD-10-CM

## 2016-07-08 DIAGNOSIS — M5136 Other intervertebral disc degeneration, lumbar region: Secondary | ICD-10-CM | POA: Diagnosis not present

## 2016-07-08 DIAGNOSIS — H35039 Hypertensive retinopathy, unspecified eye: Secondary | ICD-10-CM | POA: Diagnosis not present

## 2016-07-08 DIAGNOSIS — E119 Type 2 diabetes mellitus without complications: Secondary | ICD-10-CM | POA: Diagnosis not present

## 2016-07-08 DIAGNOSIS — I10 Essential (primary) hypertension: Secondary | ICD-10-CM | POA: Diagnosis not present

## 2016-07-08 DIAGNOSIS — E7800 Pure hypercholesterolemia, unspecified: Secondary | ICD-10-CM | POA: Diagnosis not present

## 2016-07-08 DIAGNOSIS — B372 Candidiasis of skin and nail: Secondary | ICD-10-CM

## 2016-07-08 DIAGNOSIS — H2510 Age-related nuclear cataract, unspecified eye: Secondary | ICD-10-CM | POA: Diagnosis not present

## 2016-07-08 LAB — HM DIABETES FOOT EXAM

## 2016-07-08 MED ORDER — NYSTATIN 100000 UNIT/GM EX CREA: 1.0000 "application " | TOPICAL_CREAM | Freq: Three times a day (TID) | CUTANEOUS | 2 refills | Status: DC

## 2016-07-08 NOTE — Assessment & Plan Note (Signed)
Trial of diclofenac BID x 2 weeks. Eval with X-ray. Stsart home PT. If not improving consider pred taper and formal PT.

## 2016-07-08 NOTE — Progress Notes (Signed)
Subjective:    Patient ID: Kaitlin Hamilton, female    DOB: [birth year], 65 y.o.   MRN: [id number]  HPI  65 year old female presents for DM follow up.  She also has new issues:    Diabetes:   Inadequate control on  Max glucotrol,  Max actos and onglyza 5 mg daily Lab Results  Component Value Date   HGBA1C 7.5 (H) 07/01/2016  Using medications without difficulties: Hypoglycemic episodes: None Hyperglycemic episodes: occ Feet problems: None Blood Sugars averaging:  FBS 137-171 eye exam within last year:   Increase in stress.. Not eating as well, decrease in activity  Has started back soda and sweet tea.  Dx with sciatica in 01/2016. Pain in right buttock, low back radiates to right foot. Greatest after sitting or on feet along. No weakness, no numbness.  Exercise limited with pain in right leg. Diclofenac helps temporarily with pain. Also using tyelnol for pain. Has never had X-ray of low back.  Body mass index is 44.83 kg/m. Wt Readings from Last 3 Encounters:  07/08/16 277 lb 12 oz (126 kg)  06/03/16 275 lb (124.7 kg)  05/13/16 275 lb (124.7 kg)     Elevated Cholesterol:  Not at goal, high risk CVD on  Pravastatin 20 mg daily. Lab Results  Component Value Date   CHOL 229 (H) 07/01/2016   HDL 64.20 07/01/2016   LDLCALC 132 (H) 07/01/2016   LDLDIRECT 173.0 11/30/2014   TRIG 163.0 (H) 07/01/2016   CHOLHDL 4 07/01/2016   Candidiasis under breasts improved but always returns. Cream and powder off and on.  Uses zersob OTC.   Review of Systems  Constitutional: Negative for fatigue and fever.  HENT: Negative for ear pain.   Eyes: Negative for pain.  Respiratory: Positive for shortness of breath. Negative for chest tightness.   Cardiovascular: Negative for chest pain, palpitations and leg swelling.  Gastrointestinal: Negative for abdominal pain.  Genitourinary: Negative for dysuria.       Objective:   Physical Exam  Constitutional: Vital signs are normal. She  appears well-developed and well-nourished. She is cooperative.  Non-toxic appearance. She does not appear ill. No distress.  HENT:  Head: Normocephalic.  Right Ear: Hearing, tympanic membrane, external ear and ear canal normal. Tympanic membrane is not erythematous, not retracted and not bulging.  Left Ear: Hearing, tympanic membrane, external ear and ear canal normal. Tympanic membrane is not erythematous, not retracted and not bulging.  Nose: No mucosal edema or rhinorrhea. Right sinus exhibits no maxillary sinus tenderness and no frontal sinus tenderness. Left sinus exhibits no maxillary sinus tenderness and no frontal sinus tenderness.  Mouth/Throat: Uvula is midline, oropharynx is clear and moist and mucous membranes are normal.  Eyes: Conjunctivae, EOM and lids are normal. Pupils are equal, round, and reactive to light. Lids are everted and swept, no foreign bodies found.  Neck: Trachea normal and normal range of motion. Neck supple. Carotid bruit is not present. No thyroid mass and no thyromegaly present.  Cardiovascular: Normal rate, regular rhythm, S1 normal, S2 normal, normal heart sounds, intact distal pulses and normal pulses.  Exam reveals no gallop and no friction rub.   No murmur heard. Pulmonary/Chest: Effort normal. No tachypnea. No respiratory distress. She has no decreased breath sounds. She has no wheezes. She has no rhonchi. She has rales in the right lower field and the left lower field.  Abdominal: Soft. Normal appearance and bowel sounds are normal. There is no tenderness.  Musculoskeletal:  Lumbar back: She exhibits decreased range of motion and tenderness.  Pos SLR on right AND faber's  Neurological: She is alert. She has normal strength. No cranial nerve deficit or sensory deficit. She displays a negative Romberg sign. Gait abnormal.  Skin: Skin is warm, dry and intact. No rash noted.  Psychiatric: Her speech is normal and behavior is normal. Judgment and thought  content normal. Her mood appears not anxious. Cognition and memory are normal. She does not exhibit a depressed mood.          Assessment & Plan:

## 2016-07-08 NOTE — Assessment & Plan Note (Addendum)
Continue pravastaitn low dose.. Get back  To healthy lifestyle and weight loss.

## 2016-07-08 NOTE — Assessment & Plan Note (Signed)
Inadequate cotnrol on same regimen that controlled before. Get back on track with lifestyle changes.. Decrease sweets. Follow up in 3 months.

## 2016-07-08 NOTE — Assessment & Plan Note (Signed)
Repeat course of nystatin .Marland Kitchen Increase to TID. Continue 48-72 hoyurs after resolved.

## 2016-07-08 NOTE — Assessment & Plan Note (Signed)
Well controlled. Continue current medication.  

## 2016-07-08 NOTE — Patient Instructions (Addendum)
Drink more water.. Stop sweet beverage. Decrease sweets. Work on low carb diet and exercise as able.  We will call with X-ray.  Start diclofenac twice daily x 2 weeks  Start home physical therapy for low back issue.

## 2016-07-08 NOTE — Progress Notes (Signed)
Pre visit review using our clinic review tool, if applicable. No additional management support is needed unless otherwise documented below in the visit note. 

## 2016-07-25 ENCOUNTER — Other Ambulatory Visit: Payer: Medicare Other

## 2016-07-31 ENCOUNTER — Ambulatory Visit: Payer: Medicare Other | Admitting: Family Medicine

## 2016-08-04 DIAGNOSIS — J8489 Other specified interstitial pulmonary diseases: Secondary | ICD-10-CM | POA: Diagnosis not present

## 2016-08-15 ENCOUNTER — Other Ambulatory Visit: Payer: Self-pay

## 2016-08-15 MED ORDER — DICLOFENAC SODIUM 75 MG PO TBEC: DELAYED_RELEASE_TABLET | ORAL | 0 refills | Status: DC

## 2016-08-15 NOTE — Telephone Encounter (Signed)
Left message for Kaitlin Hamilton, that Dr. [name] sent in a refill for her Diclofenac to [facility].

## 2016-08-15 NOTE — Telephone Encounter (Signed)
Pt left v/m requesting refill diclofenac to [facility]. Pt has been out of med for a while and right sciatic pain has started again. Per office note on 07/08/16 there was to be a trial diclofenac bid x 2 weeks. eval with xray which was done and start home PT. If not improved consider pred taper and formal PT.Please advise.

## 2016-09-04 DIAGNOSIS — J8489 Other specified interstitial pulmonary diseases: Secondary | ICD-10-CM | POA: Diagnosis not present

## 2016-09-15 ENCOUNTER — Ambulatory Visit: Payer: Medicare Other | Admitting: Pulmonary Disease

## 2016-09-19 ENCOUNTER — Encounter: Payer: Self-pay | Admitting: Pulmonary Disease

## 2016-09-19 ENCOUNTER — Other Ambulatory Visit (INDEPENDENT_AMBULATORY_CARE_PROVIDER_SITE_OTHER): Payer: Medicare Other

## 2016-09-19 ENCOUNTER — Ambulatory Visit (INDEPENDENT_AMBULATORY_CARE_PROVIDER_SITE_OTHER): Payer: Medicare Other | Admitting: Pulmonary Disease

## 2016-09-19 VITALS — BP 126/74 | HR 80 | Ht 66.0 in

## 2016-09-19 DIAGNOSIS — G4733 Obstructive sleep apnea (adult) (pediatric): Secondary | ICD-10-CM | POA: Diagnosis not present

## 2016-09-19 DIAGNOSIS — Z5181 Encounter for therapeutic drug level monitoring: Secondary | ICD-10-CM | POA: Diagnosis not present

## 2016-09-19 DIAGNOSIS — J84112 Idiopathic pulmonary fibrosis: Secondary | ICD-10-CM

## 2016-09-19 DIAGNOSIS — J9611 Chronic respiratory failure with hypoxia: Secondary | ICD-10-CM | POA: Diagnosis not present

## 2016-09-19 DIAGNOSIS — R0602 Shortness of breath: Secondary | ICD-10-CM

## 2016-09-19 LAB — CBC WITH DIFFERENTIAL/PLATELET
BASOS ABS: 0.1 10*3/uL (ref 0.0–0.1)
Basophils Relative: 0.6 % (ref 0.0–3.0)
Eosinophils Absolute: 0.1 10*3/uL (ref 0.0–0.7)
Eosinophils Relative: 1.1 % (ref 0.0–5.0)
HCT: 38.7 % (ref 36.0–46.0)
Hemoglobin: 13 g/dL (ref 12.0–15.0)
LYMPHS ABS: 1.6 10*3/uL (ref 0.7–4.0)
LYMPHS PCT: 16.7 % (ref 12.0–46.0)
MCHC: 33.6 g/dL (ref 30.0–36.0)
MCV: 89.9 fl (ref 78.0–100.0)
MONO ABS: 0.5 10*3/uL (ref 0.1–1.0)
Monocytes Relative: 5.3 % (ref 3.0–12.0)
NEUTROS ABS: 7.2 10*3/uL (ref 1.4–7.7)
NEUTROS PCT: 76.3 % (ref 43.0–77.0)
PLATELETS: 273 10*3/uL (ref 150.0–400.0)
RBC: 4.3 Mil/uL (ref 3.87–5.11)
RDW: 14.9 % (ref 11.5–15.5)
WBC: 9.4 10*3/uL (ref 4.0–10.5)

## 2016-09-19 MED ORDER — PIRFENIDONE 801 MG PO TABS: 1.0000 | ORAL_TABLET | Freq: Three times a day (TID) | ORAL | 5 refills | Status: DC

## 2016-09-19 NOTE — Assessment & Plan Note (Signed)
Her CPAP machine has been giving a little bit more trouble with higher pressures. She would prefer to use a lower pressure. She's on an auto titrating device that maintains her median pressure around 6 and a meters of water which is much more comfortable for her.  Plan: Adjust settings on the autotitrator to range between 5 and 10 senna meters of water Continue CPAP Showed a compliance report printed out today which shows 70% compliance over 30 days.

## 2016-09-19 NOTE — Assessment & Plan Note (Signed)
Kaitlin Hamilton continues to have a steady decline in PFTs and symptoms which is consistent with the natural history of her disease.  She has not had a complication form her Esbriet which has been shown to slow the progression of that disease.  Today we had a frank conversation about the fact that her lung function testing has declined in her symptoms worsen. I explained to her that she should expect that she's going to need to give up some independence this year because of worsening symptoms. She was tearful about this, but her family clearly supports her.  Plan: Continue CellCept low-dose Continue Esbriet, they will talk to their pharmacy about changing to the single pill dose 3 times a day Check CBC to monitor for drug toxicity 6 minute walk today

## 2016-09-19 NOTE — Patient Instructions (Signed)
Keep taking Esbriet as you are doing Keep taking CellCept as you're doing Keep using your oxygen as you're doing We will prescribe an oxygen conserving device We will change the settings on your CPAP We will see you back in 3 months or sooner if needed

## 2016-09-19 NOTE — Progress Notes (Signed)
Subjective:    Patient ID: Kaitlin Hamilton, female    DOB: 1951-06-16, 66 y.o.   MRN: [id number]  Synopsis: Kaitlin Hamilton is a very pleasant 66 year old female who first saw the [facility] pulmonary clinic in March 2014 for evaluation of shortness of breath. She had significant cough, wheezing, and sputum production requiring multiple rounds of antibiotics. Because of crackles on lung exam and an abnormal pulmonary function test she was referred to [place]. We ordered full pulmonary function testing which showed moderate restriction and a depressed DLCO in proportion to her restriction. There is no airflow obstruction and no change with bronchodilator administration. A chest x-ray performed in February 2014 was read as normal.  An open lung biopsy was performed in December 2014 showing UIP.  Because she has had multiple good responses to steroids, we have treated her with cellcept and prednisone.  In January 2015 she had severe insomnia, hallucinations and gastritis on high dose prednisone.  She had a rash to bactrim. Since February 2015 she has been on cellcept and low dose prednisone and dapsone. Because of progression in hypoxemia and lung function testing in 2016 we changed her therapy to anti-fibrotic therapy with Esbriet. Because of a strong concern for an underlying connective tissue disease it was decided that she will be maintained on low-dose CellCept.   HPI Chief Complaint  Patient presents with  . Follow-up    pt states she is stable, notes sob with exertion, nonprod "hacky" cough worse when eating.     Kaitlin Hamilton is trying to staying health this year.  She has not had any bad colds or anything.  She is really trying to avoid sick contacts, etc.  She is getting out more. Her dyspnea is definitely worse than it was a year ago.  She can tell a difference even in the last three months.  She says that she cannot vacuum the house as well as she did before.  She describes significant fatigue even with  walking on level ground, but she is still doing all this type of activity independently.  Climbing a flight of stairs is tough.    She continues to use and benefit from her oxygen. She is using 4L sometimes at rest.  She will increase it to 5Lpm.    She says her CPAP machine pressure is "shooting up" spontaneously.  She hears wheezing when lying flat at night.  She doesn't cough up mucus.  Past Medical History:  Diagnosis Date  . Allergic rhinitis    uses Flonase daily  . Anesthesia complication    Per pt/ past endoscopy in 2015, the anesthetic spray in back throat caused her to have breathing problems  . Anxiety   . Bronchitis   . Chicken pox   . Constipation    takes Fiber daily  . Depression   . Diverticulosis   . Dysphagia   . GERD (gastroesophageal reflux disease)    takes Protonix daily  . H/O hiatal hernia   . Hemorrhoids   . History of bronchitis    2013  . History of colon polyps   . History of kidney stones   . Hyperlipidemia    lost 43 pounds and no meds required at present  . Hypertension    takes Verapamil and HCTZ daily  . Hypokalemia   . Hypothyroidism (acquired)    takes Synthroid daily  . Insomnia    doesn't take any meds for this  . Joint swelling    left thumb  .  Migraines    last one about a month ago  . Non-insulin dependent type 2 diabetes mellitus ([place])    takes Glipizide daily  . OA (osteoarthritis)    left knee  . Oxygen deficiency   . Oxygen dependent    Pt is on continuous O2 at 3 liters!  . Pneumonia    last time in 2006  . PONV (postoperative nausea and vomiting)   . Shortness of breath    with exertion;takes Singulair daily as well as Flonase  . Urinary frequency         Review of Systems  Constitutional: Negative for activity change, chills, fatigue and fever.  HENT: Negative for congestion, ear pain, nosebleeds, postnasal drip, rhinorrhea and sinus pressure.   Respiratory: Positive for shortness of breath. Negative for  cough and wheezing.   Cardiovascular: Negative for chest pain, palpitations and leg swelling.  Gastrointestinal: Negative for abdominal pain and nausea.       Objective:   Physical Exam  Vitals:   09/19/16 0955  BP: 126/74  BP Location: Right Arm  Cuff Size: Normal  Pulse: 80  SpO2: 99%  Height: '5\' 6"'  (1.676 m)  3L [place]   Gen: morbidly obese, well appearing HENT: OP clear, TM's clear, neck supple PULM: Crackles bases B, normal percussion CV: RRR, no mgr, trace edema GI: BS+, soft, nontender Derm: no cyanosis or rash Psyche: normal mood and affect     February 2014 chest x-ray at [facility] normal   11/2012 CT chest >> ([facility] read) centrilobular nodules, interlobular septal thickening worse in bases and periphery R lung > L; some GGO in bases and periphery as well, some bronchiectasis in the bases R > L; findings suggestive of fibrosis but not UIP; question hypersensitivity pneumonitis given centrilobular nodules; also question aspiration  03/2013 ANA, ANCA, Anti-Jo-1, ESR, RF, SCL-70, anti-centromere, SSA/SSB, all negative; CRP 0.6;  04/2013 Barium swallow> abnormal esophageal motility, GERD, hiatal hernia  04/2013 CT chest ([name])> findings suggestive of but not diagnostic of NSIP, small pulmonary nodule  04/2013 HP panel >> 05/17/2013 3 month prednisone trial 06/2013 open lung biopsy> UIP    February 2016 CT chest high resolution> slight progression in reticular abnormalities consistent with pulmonary fibrosis, uip  6 minute walk 10/19/2012 walked 500 feet in office on room air oxygenation did not drop below 90% 04/2013 6MW RA > 1100 feet, HR peak 109, O2 sat Nadir 87% 11/2013 6MW 1364 feet, O2 saturation nadir 78% 08/21/14 6MW 1400 feet 90% on 8L  PFT February 2014 simple spirometry performed by her primary care physician>> ratio 90%, FEV1 1.71 L (64% predicted, FVC 1.83 L 55% predicted; flow volume loop is not consistent with obstruction 11/15/2012 Full PFT [name]>  Ratio 87%, FEV1 2.00L > 2.07 with bronchodilator (3% change); TLC 3.44 L (65% pred), ERV 0.62 (57% pred), DLCO 15.1 ml/mmHg/min (59% pred) 04/2013 Full PFT> Ratio 93%, FEV1 1.92 L (71% pred), TLC 3.02L (56% pred), DLCO 15.94 (59% pred) May 2015 full pulmonary function test ratio 93%, FEV1 1.71 L (64% predicted, no change with bronchodilator), total lung capacity 2.67 L (49% predicted), DLCO 13.11 (40% predicted) 03/2015 PFT > Ratio 93%, FEV1 1.24L, FVC 1.33L (38% pred), TLC 2.56L (47% pred), DLCO 12.10 (44% pred) March 2017 pulmonary function testing ratio 93%, FVC (41% predicted), total lung capacity 2.34 L (43% protected), DLCO 10.53 (39% protected). November 2017 pulmonary function testing ratio 92%, FVC 1.26 L, 37% predicted, total lung capacity 2.37 L 44% predicted, DLCO 5. 02/27/2020  percent predicted  My review of her PCP notes show where she was cared for for routine health maintenance issues and had LFT's drawn which were normal      Assessment & Plan:   UIP (usual interstitial pneumonitis) ([place]) [place] continues to have a steady decline in PFTs and symptoms which is consistent with the natural history of her disease.  She has not had a complication form her Esbriet which has been shown to slow the progression of that disease.  Today we had a frank conversation about the fact that her lung function testing has declined in her symptoms worsen. I explained to her that she should expect that she's going to need to give up some independence this year because of worsening symptoms. She was tearful about this, but her family clearly supports her.  Plan: Continue CellCept low-dose Continue Esbriet, they will talk to their pharmacy about changing to the single pill dose 3 times a day Check CBC to monitor for drug toxicity 6 minute walk today  OSA (obstructive sleep apnea) Her CPAP machine has been giving a little bit more trouble with higher pressures. She would prefer to use a lower  pressure. She's on an auto titrating device that maintains her median pressure around 6 and a meters of water which is much more comfortable for her.  Plan: Adjust settings on the autotitrator to range between 5 and 10 senna meters of water Continue CPAP Showed a compliance report printed out today which shows 70% compliance over 30 days.  Chronic hypoxemic respiratory failure (HCC) Titrate O2 based on 6 minute walk results today. Prescribe Oxymizer as an oxygen conserving device.  > 50% of time spent face to face in a 28 minute visit  Updated Medication List Outpatient Encounter Prescriptions as of 09/19/2016  Medication Sig Dispense Refill  . acetaminophen (TYLENOL) 500 MG tablet Take 1,000 mg by mouth every 6 (six) hours as needed for moderate pain.    Marland Kitchen diclofenac (VOLTAREN) 75 MG EC tablet Take 1 tablet by mouth two  times daily 180 tablet 0  . diphenhydrAMINE (BENADRYL) 25 mg capsule Take 25 mg by mouth as needed.    Marland Kitchen FIBER PO Take 1 tablet by mouth daily.    . fluticasone (FLONASE) 50 MCG/ACT nasal spray Place 1 spray into both nostrils daily.    Marland Kitchen glipiZIDE (GLUCOTROL) 10 MG tablet TAKE 1 TABLET BY MOUTH  TWICE A DAY BEFORE A MEAL 180 tablet 1  . levothyroxine (SYNTHROID, LEVOTHROID) 125 MCG tablet TAKE 1 TABLET BY MOUTH  DAILY BEFORE BREAKFAST 90 tablet 2  . losartan-hydrochlorothiazide (HYZAAR) 50-12.5 MG tablet Take 1 tablet by mouth daily. 30 tablet 5  . montelukast (SINGULAIR) 10 MG tablet Take 1 tablet (10 mg total) by mouth at bedtime. 90 tablet 3  . mycophenolate (CELLCEPT) 500 MG tablet Take 1 tablet (500 mg total) by mouth daily. 90 tablet 3  . NON FORMULARY Place 3 L into the nose daily. 4-5 Liters with exertion    . nystatin cream (MYCOSTATIN) Apply 1 application topically 3 (three) times daily. 30 g 2  . ONGLYZA 5 MG TABS tablet TAKE 1 TABLET BY MOUTH  DAILY 90 tablet 1  . pantoprazole (PROTONIX) 40 MG tablet Take 1 tablet by mouth two  times daily 180 tablet 3  .  pioglitazone (ACTOS) 45 MG tablet Take 1 tablet (45 mg total) by mouth daily. 30 tablet 5  . Pirfenidone 267 MG CAPS Take 3 capsules by mouth 3 (three) times  daily. 270 capsule 11  . polyethylene glycol powder (MIRALAX) powder Take 1 Container by mouth. Miralax bowel prep 238 gm-Take as directed    . pravastatin (PRAVACHOL) 20 MG tablet Take 1 tablet (20 mg total) by mouth daily. 30 tablet 5  . verapamil (CALAN-SR) 180 MG CR tablet TAKE 1 TABLET BY MOUTH  DAILY 90 tablet 1  . [DISCONTINUED] bisacodyl (BISACODYL) 5 MG EC tablet Take 5 mg by mouth. Dulcolax 5 mg bowel prep #4-Take as directed     No facility-administered encounter medications on file as of 09/19/2016.

## 2016-09-19 NOTE — Assessment & Plan Note (Signed)
Titrate O2 based on 6 minute walk results today. Prescribe Oxymizer as an oxygen conserving device.

## 2016-09-23 ENCOUNTER — Telehealth: Payer: Self-pay | Admitting: Pulmonary Disease

## 2016-09-23 NOTE — Telephone Encounter (Signed)
Notes Recorded by Juanito Doom, MD on 09/22/2016 at 9:37 PM EST A, Please let the patient know this was OK Thanks, B  Pt aware of results and voiced her understanding. Nothing further needed.

## 2016-09-25 ENCOUNTER — Telehealth: Payer: Self-pay | Admitting: Family Medicine

## 2016-09-25 NOTE — Telephone Encounter (Signed)
Lab appointment scheduled for 10/23/16 at 9:05 am.  Will forward to Dr. [name] to order future labs.  While on the phone, Ms. Bezold states she is having the break out again underneath her stomach.  She started using the Nystatin Cream again applying it three times a day.  I advised her to continue using the cream and if there is no improvement in the next 2 to 3 day, to let us know.  She states last time Dr. [name] had to prescribe an "antibiotic" before it completely went away.

## 2016-09-25 NOTE — Telephone Encounter (Signed)
Patient has appointment for a follow up with Dr.Bedsole on 10/31/16.  Patient wants to know if she needs to come in for a lab appointment before the office visit to check her A1c?

## 2016-09-25 NOTE — Telephone Encounter (Signed)
Yes fasting labs prior please

## 2016-09-29 ENCOUNTER — Telehealth: Payer: Self-pay | Admitting: Pulmonary Disease

## 2016-09-29 NOTE — Telephone Encounter (Signed)
A prior authorization was faxed to [facility] at [phone] for Esbriet 801mg  with 1 tablet three times daily. The fax contained the prior authorization paper, surgical pathology report, and DM office note from 09/19/2016. Will await for decision.

## 2016-10-01 NOTE — Telephone Encounter (Signed)
A fax with approval decision was received for esbriet through 07/20/17. Pt was contacted and made aware of approval. She did not have any additional questions. [place] [phone]) was also made aware of approval. Nothing further is needed.

## 2016-10-02 ENCOUNTER — Telehealth: Payer: Self-pay | Admitting: Family Medicine

## 2016-10-02 DIAGNOSIS — J8489 Other specified interstitial pulmonary diseases: Secondary | ICD-10-CM | POA: Diagnosis not present

## 2016-10-02 NOTE — Telephone Encounter (Signed)
Patient Name: Kaitlin Hamilton DOB: Jul 28, 1950 Initial Comment Caller states she is needing to schedule an appt, she is passing blood in her urine, which started two hours ago. Little discomfort with it. Nurse Assessment Nurse: Sherrell Puller, RN, Amy Date/Time Eilene Ghazi Time): 10/02/2016 2:48:16 PM Confirm and document reason for call. If symptomatic, describe symptoms. ---Caller states she's passing blood in her urine and feeling pain in L pelvic area and some burning when she goes, started 2 hours ago. Vaginal itching. No fever. Drinking plenty of fluids. Does the patient have any new or worsening symptoms? ---Yes Will a triage be completed? ---Yes Related visit to physician within the last 2 weeks? ---No Does the PT have any chronic conditions? (i.e. diabetes, asthma, etc.) ---Yes List chronic conditions. ---Diabetes, high cholesterol, lung disease. Is this a behavioral health or substance abuse call? ---No Guidelines Guideline Title Affirmed Question Affirmed Notes Urine - Blood In Pain or burning with passing urine Final Disposition User See Physician within 24 Hours Ramsey, RN, Amy Comments NO appt available with PCP. Appt scheduled for tomorrow 10am with [name], NP Referrals REFERRED TO PCP OFFICE Disagree/Comply: Comply

## 2016-10-02 NOTE — Telephone Encounter (Signed)
Pt has appt to see Gentry Fitz NP on 10/03/16 at 10 AM.

## 2016-10-03 ENCOUNTER — Ambulatory Visit (INDEPENDENT_AMBULATORY_CARE_PROVIDER_SITE_OTHER): Payer: Medicare Other | Admitting: Primary Care

## 2016-10-03 ENCOUNTER — Encounter: Payer: Self-pay | Admitting: Primary Care

## 2016-10-03 ENCOUNTER — Telehealth: Payer: Self-pay

## 2016-10-03 VITALS — BP 126/72 | HR 80 | Temp 97.3°F | Ht 66.0 in | Wt 271.8 lb

## 2016-10-03 DIAGNOSIS — N200 Calculus of kidney: Secondary | ICD-10-CM | POA: Diagnosis not present

## 2016-10-03 DIAGNOSIS — R109 Unspecified abdominal pain: Secondary | ICD-10-CM | POA: Diagnosis not present

## 2016-10-03 LAB — POC URINALSYSI DIPSTICK (AUTOMATED)
GLUCOSE UA: NEGATIVE
Ketones, UA: NEGATIVE
LEUKOCYTES UA: NEGATIVE
NITRITE UA: NEGATIVE
PH UA: 5.5 (ref 5.0–8.0)
Spec Grav, UA: 1.03 (ref 1.030–1.035)
UROBILINOGEN UA: NEGATIVE (ref ?–2.0)

## 2016-10-03 MED ORDER — TAMSULOSIN HCL 0.4 MG PO CAPS: 0.4000 mg | ORAL_CAPSULE | Freq: Every day | ORAL | 0 refills | Status: DC

## 2016-10-03 MED ORDER — TRAMADOL HCL 50 MG PO TABS: 50.0000 mg | ORAL_TABLET | Freq: Four times a day (QID) | ORAL | 0 refills | Status: DC | PRN

## 2016-10-03 NOTE — Patient Instructions (Addendum)
Start tamsulosin (Flomax) capsules to facilitate passage of the kidney stone.  You may take Tramadol every 6 hours as needed for pain.  Continue to stay hydrated with water and rest.  Please call Monday if no improvement or go to the hospital over the weekend if symptoms get worse.  It was a pleasure meeting you!   Kidney Stones Kidney stones (urolithiasis) are solid, rock-like deposits that form inside of the organs that make urine (kidneys). A kidney stone may form in a kidney and move into the bladder, where it can cause intense pain and block the flow of urine. Kidney stones are created when high levels of certain minerals are found in the urine. They are usually passed through urination, but in some cases, medical treatment may be needed to remove them. What are the causes? Kidney stones may be caused by:  A condition in which certain glands produce too much parathyroid hormone (primary hyperparathyroidism), which causes too much calcium buildup in the blood.  Buildup of uric acid crystals in the bladder (hyperuricosuria). Uric acid is a chemical that the body produces when you eat certain foods. It usually exits the body in the urine.  Narrowing (stricture) of one or both of the tubes that drain urine from the kidneys to the bladder (ureters).  A kidney blockage that is present at birth (congenital obstruction).  Past surgery on the kidney or the ureters, such as gastric bypass surgery. What increases the risk? The following factors make you more likely to develop kidney stones:  Having had a kidney stone in the past.  Having a family history of kidney stones.  Not drinking enough water.  Eating a diet that is high in protein, salt (sodium), or sugar.  Being overweight or obese. What are the signs or symptoms? Symptoms of a kidney stone may include:  Nausea.  Vomiting.  Blood in the urine (hematuria).  Pain in the side of the abdomen, right below the ribs (flank  pain). Pain usually spreads (radiates) to the groin.  Needing to urinate frequently or urgently. How is this diagnosed? This condition may be diagnosed based on:  Your medical history.  A physical exam.  Blood tests.  Urine tests.  CT scan.  Abdominal X-ray.  A procedure to examine the inside of the bladder (cystoscopy). How is this treated? Treatment for kidney stones depends on the size, location, and makeup of the stones. Treatment may involve:  Analyzing your urine before and after you pass the stone through urination.  Being monitored at the hospital until you pass the stone through urination.  Increasing your fluid intake and decreasing the amount of calcium and protein in your diet.  A procedure to break up kidney stones in the bladder using:  A focused beam of light (laser therapy).  Shock waves (extracorporeal shock wave lithotripsy).  Surgery to remove kidney stones. This may be needed if you have severe pain or have stones that block your urinary tract. Follow these instructions at home: Eating and drinking    Drink enough fluid to keep your urine clear or pale yellow. This will help you to pass the kidney stone.  If directed, change your diet. This may include:  Limiting how much sodium you eat.  Eating more fruits and vegetables.  Limiting how much meat, poultry, fish, and eggs you eat.  Follow instructions from your health care provider about eating or drinking restrictions. General instructions   Collect urine samples as told by your health care provider.  You may need to collect a urine sample:  24 hours after you pass the stone.  8-12 weeks after passing the kidney stone, and every 6-12 months after that.  Strain your urine every time you urinate, for as long as directed. Use the strainer that your health care provider recommends.  Do not throw out the kidney stone after passing it. Keep the stone so it can be tested by your health care  provider. Testing the makeup of your kidney stone may help prevent you from getting kidney stones in the future.  Take over-the-counter and prescription medicines only as told by your health care provider.  Keep all follow-up visits as told by your health care provider. This is important. You may need follow-up X-rays or ultrasounds to make sure that your stone has passed. How is this prevented? To prevent another kidney stone:  Drink enough fluid to keep your urine clear or pale yellow. This is the best way to prevent kidney stones.  Eat a healthy diet and follow recommendations from your health care provider about foods to avoid. You may be instructed to eat a low-protein diet. Recommendations vary depending on the type of kidney stone that you have.  Maintain a healthy weight. Contact a health care provider if:  You have pain that gets worse or does not get better with medicine. Get help right away if:  You have a fever or chills.  You develop severe pain.  You develop new abdominal pain.  You faint.  You are unable to urinate. This information is not intended to replace advice given to you by your health care provider. Make sure you discuss any questions you have with your health care provider. Document Released: 07/07/2005 Document Revised: 01/25/2016 Document Reviewed: 12/21/2015 Elsevier Interactive Patient Education  2017 [employer].

## 2016-10-03 NOTE — Telephone Encounter (Signed)
Noted  

## 2016-10-03 NOTE — Progress Notes (Signed)
Subjective:    Patient ID: Kaitlin Hamilton, female    DOB: 1951-03-04, 66 y.o.   MRN: [id number]  HPI  Kaitlin Hamilton is a 66 year old female with a history of type 2 diabetes, kidney stones, UTI who presents today with a chief complaint of hematuria. She also reports left sided flank and grion pain, difficulty urinating, nausea. Her symptoms began yesterday while at the grocery store. She then urinated when she came home from the store and noticed hematuria. The hematuria continued up until 10 pm last night. She denies vaginal bleeding, vaginal discharge, fevers. She's not taken anything for her symptoms. She's staying hydrated with water. This feels very similar to her prior kidney stone.  Review of Systems  Constitutional: Negative for fever.  Gastrointestinal: Positive for nausea. Negative for abdominal pain and vomiting.  Genitourinary: Positive for decreased urine volume, difficulty urinating, flank pain, frequency and hematuria. Negative for dysuria and vaginal discharge.       Past Medical History:  Diagnosis Date  . Allergic rhinitis    uses Flonase daily  . Anesthesia complication    Per pt/ past endoscopy in 2015, the anesthetic spray in back throat caused her to have breathing problems  . Anxiety   . Bronchitis   . Chicken pox   . Constipation    takes Fiber daily  . Depression   . Diverticulosis   . Dysphagia   . GERD (gastroesophageal reflux disease)    takes Protonix daily  . H/O hiatal hernia   . Hemorrhoids   . History of bronchitis    2013  . History of colon polyps   . History of kidney stones   . Hyperlipidemia    lost 43 pounds and no meds required at present  . Hypertension    takes Verapamil and HCTZ daily  . Hypokalemia   . Hypothyroidism (acquired)    takes Synthroid daily  . Insomnia    doesn't take any meds for this  . Joint swelling    left thumb  . Migraines    last one about a month ago  . Non-insulin dependent type 2 diabetes mellitus (Kaitlin Hamilton)    takes Glipizide daily  . OA (osteoarthritis)    left knee  . Oxygen deficiency   . Oxygen dependent    Pt is on continuous O2 at 3 liters!  . Pneumonia    last time in 2006  . PONV (postoperative nausea and vomiting)   . Shortness of breath    with exertion;takes Singulair daily as well as Flonase  . Urinary frequency      Social History   Social History  . Marital status: Widowed    Spouse name: N/A  . Number of children: 2  . Years of education: N/A   Occupational History  . Day Care at [employer]    Social History Main Topics  . Smoking status: Never Smoker  . Smokeless tobacco: Never Used  . Alcohol use No  . Drug use: No  . Sexual activity: No   Other Topics Concern  . Not on file   Social History Narrative   Daily caffeine     Exercise: none recently    Diet: poor    Past Surgical History:  Procedure Laterality Date  . ABDOMINAL EXPLORATION SURGERY     For Ovarian Cyst   . APPENDECTOMY    . CHOLECYSTECTOMY    . ENTEROSCOPY N/A 08/12/2013   Procedure: ENTEROSCOPY;  Surgeon: Lafayette Dragon,  MD;  Location: [facility];  Service: Endoscopy;  Laterality: N/A;  . ESOPHAGOGASTRODUODENOSCOPY    . left knee arthroscopy    . LITHOTRIPSY     x 2  . LUNG BIOPSY Right 06/29/2013   Procedure: LUNG BIOPSY;  Surgeon: Melrose Nakayama, MD;  Location: [facility];  Service: Thoracic;  Laterality: Right;  . TCS    . TUBAL LIGATION    . VIDEO ASSISTED THORACOSCOPY Right 06/29/2013   Procedure: VIDEO ASSISTED THORACOSCOPY;  Surgeon: Melrose Nakayama, MD;  Location: [facility];  Service: Thoracic;  Laterality: Right;    Family History  Problem Relation Age of Onset  . Rheum arthritis Mother   . Rheum arthritis Sister   . Prostate cancer Brother   . Heart disease Brother   . Heart disease Maternal Grandmother   . Heart disease Maternal Grandfather   . Uterine cancer Daughter   . Colon cancer Neg Hx     Allergies  Allergen Reactions  . Adhesive [Tape]     Redness,  bruising with any adhesives- bandaids, patches, etc.   . Atovaquone Itching  . Codeine Hives and Swelling  . Demerol [Meperidine] Hives and Swelling  . Hydrocodone Nausea Only  . Sulfa Antibiotics Rash    Current Outpatient Prescriptions on File Prior to Visit  Medication Sig Dispense Refill  . acetaminophen (TYLENOL) 500 MG tablet Take 1,000 mg by mouth every 6 (six) hours as needed for moderate pain.    . diphenhydrAMINE (BENADRYL) 25 mg capsule Take 25 mg by mouth as needed.    Marland Kitchen FIBER PO Take 1 tablet by mouth daily.    . fluticasone (FLONASE) 50 MCG/ACT nasal spray Place 1 spray into both nostrils daily.    Marland Kitchen glipiZIDE (GLUCOTROL) 10 MG tablet TAKE 1 TABLET BY MOUTH  TWICE A DAY BEFORE A MEAL 180 tablet 1  . levothyroxine (SYNTHROID, LEVOTHROID) 125 MCG tablet TAKE 1 TABLET BY MOUTH  DAILY BEFORE BREAKFAST 90 tablet 2  . losartan-hydrochlorothiazide (HYZAAR) 50-12.5 MG tablet Take 1 tablet by mouth daily. 30 tablet 5  . montelukast (SINGULAIR) 10 MG tablet Take 1 tablet (10 mg total) by mouth at bedtime. 90 tablet 3  . mycophenolate (CELLCEPT) 500 MG tablet Take 1 tablet (500 mg total) by mouth daily. 90 tablet 3  . NON FORMULARY Place 3 L into the nose daily. 4-5 Liters with exertion    . nystatin cream (MYCOSTATIN) Apply 1 application topically 3 (three) times daily. 30 g 2  . ONGLYZA 5 MG TABS tablet TAKE 1 TABLET BY MOUTH  DAILY 90 tablet 1  . pantoprazole (PROTONIX) 40 MG tablet Take 1 tablet by mouth two  times daily 180 tablet 3  . pioglitazone (ACTOS) 45 MG tablet Take 1 tablet (45 mg total) by mouth daily. 30 tablet 5  . Pirfenidone (ESBRIET) 801 MG TABS Take 1 tablet by mouth 3 (three) times daily. 90 tablet 5  . pravastatin (PRAVACHOL) 20 MG tablet Take 1 tablet (20 mg total) by mouth daily. 30 tablet 5  . verapamil (CALAN-SR) 180 MG CR tablet TAKE 1 TABLET BY MOUTH  DAILY 90 tablet 1  . diclofenac (VOLTAREN) 75 MG EC tablet Take 1 tablet by mouth two  times daily (Patient  not taking: Reported on 10/03/2016) 180 tablet 0   No current facility-administered medications on file prior to visit.     BP 126/72   Pulse 80   Temp 97.3 F (36.3 C) (Oral)   Ht 5\' 6"  (1.676  m)   Wt 271 lb 12.8 oz (123.3 kg)   SpO2 99%   BMI 43.87 kg/m    Objective:   Physical Exam  Constitutional:  Appears uncomfortable  Cardiovascular: Normal rate and regular rhythm.   Pulmonary/Chest: Effort normal and breath sounds normal.  Abdominal: Soft. Bowel sounds are normal. There is no tenderness. There is CVA tenderness.  Left CVA tenderness  Genitourinary:  Genitourinary Comments: Left groin tenderness  Skin: Skin is warm and dry.          Assessment & Plan:  Renal Stone:  Hematuria, flank and groin pain x 24 hours. History of renal stones for years. Exam today consistent for stone.  UA: 3+ blood, negative leuks and nitrites. Treat with Flomax and Tramadol. She has had Tramadol in the past and can tolerate. Allergy to hydrocodone. She's never tried Flomax, does have allergy to sulfa, discussed precautions, she has benadryl at home in case. Continue to push in take of water. She will use her strainer. ED precautions provided for over the weekend. Follow up PRN.  Sheral Flow, NP

## 2016-10-03 NOTE — Progress Notes (Signed)
Pre visit review using our clinic review tool, if applicable. No additional management support is needed unless otherwise documented below in the visit note. 

## 2016-10-03 NOTE — Telephone Encounter (Signed)
Kaitlin Hamilton called back saying pt took an extra tramadol at 3pm and is still in a lot of pain. Wonders if toradol or something else could be called in. She is drinking a lot of fluids but has just gotten to where she cannot void. How long should she wait before going to ER? If calling in something different [employer].

## 2016-10-03 NOTE — Telephone Encounter (Signed)
Pt took 1 tramadol at 1130 but is in severe pain. Wanted to know about taking another tramadol early. Talked to [place]. She said to let the pt know she can take # 2 3 times a day. I let the pt know she can go ahead and take another one and wait about 5-6 hours before taking 2 more.

## 2016-10-06 ENCOUNTER — Telehealth: Payer: Self-pay | Admitting: Primary Care

## 2016-10-06 NOTE — Telephone Encounter (Signed)
Pt saw Anda Kraft on 3/16... Call for update.

## 2016-10-06 NOTE — Telephone Encounter (Signed)
Please notify patient that I'm happy to hear she passed the kidney stone, she may stop her Flomax.

## 2016-10-06 NOTE — Telephone Encounter (Signed)
Spoken and notified patient of Kate's comments. Patient verbalized understanding. 

## 2016-10-06 NOTE — Telephone Encounter (Signed)
Patient said she passed the kidney stone this morning.  She said it was a rough weekend, but she's feeling better.  Patient wants to know if she is suppose to stop taking Tamsulosin HCL.

## 2016-10-06 NOTE — Telephone Encounter (Signed)
See telephone call from today 10/06/2017 to K. Clark.   Apparently Kaitlin Hamilton passed a kidney stone and is feeling better.  Kaitlin Hamilton stopped her Flomax.

## 2016-10-07 NOTE — Telephone Encounter (Signed)
Reviewed notes 

## 2016-10-09 ENCOUNTER — Other Ambulatory Visit: Payer: Self-pay | Admitting: Family Medicine

## 2016-10-23 ENCOUNTER — Telehealth: Payer: Self-pay | Admitting: Family Medicine

## 2016-10-23 ENCOUNTER — Other Ambulatory Visit (INDEPENDENT_AMBULATORY_CARE_PROVIDER_SITE_OTHER): Payer: Medicare Other

## 2016-10-23 DIAGNOSIS — E7800 Pure hypercholesterolemia, unspecified: Secondary | ICD-10-CM

## 2016-10-23 DIAGNOSIS — IMO0001 Reserved for inherently not codable concepts without codable children: Secondary | ICD-10-CM

## 2016-10-23 DIAGNOSIS — E1165 Type 2 diabetes mellitus with hyperglycemia: Secondary | ICD-10-CM | POA: Diagnosis not present

## 2016-10-23 DIAGNOSIS — E039 Hypothyroidism, unspecified: Secondary | ICD-10-CM

## 2016-10-23 LAB — LIPID PANEL
CHOL/HDL RATIO: 3
Cholesterol: 208 mg/dL — ABNORMAL HIGH (ref 0–200)
HDL: 66.5 mg/dL (ref >39.00)
LDL Cholesterol: 112 mg/dL — ABNORMAL HIGH (ref 0–99)
NONHDL: 141.21
Triglycerides: 145 mg/dL (ref 0.0–149.0)
VLDL: 29 mg/dL (ref 0.0–40.0)

## 2016-10-23 LAB — COMPREHENSIVE METABOLIC PANEL
ALT: 12 U/L (ref 0–35)
AST: 13 U/L (ref 0–37)
Albumin: 4.2 g/dL (ref 3.5–5.2)
Alkaline Phosphatase: 85 U/L (ref 39–117)
BILIRUBIN TOTAL: 0.4 mg/dL (ref 0.2–1.2)
BUN: 17 mg/dL (ref 6–23)
CO2: 35 meq/L — AB (ref 19–32)
CREATININE: 0.76 mg/dL (ref 0.40–1.20)
Calcium: 10 mg/dL (ref 8.4–10.5)
Chloride: 98 mEq/L (ref 96–112)
GFR: 80.95 mL/min (ref >60.00)
GLUCOSE: 165 mg/dL — AB (ref 70–99)
Potassium: 4.2 mEq/L (ref 3.5–5.1)
SODIUM: 139 meq/L (ref 135–145)
Total Protein: 7.5 g/dL (ref 6.0–8.3)

## 2016-10-23 LAB — HEMOGLOBIN A1C: HEMOGLOBIN A1C: 7.8 % — AB (ref 4.6–6.5)

## 2016-10-23 NOTE — Telephone Encounter (Signed)
-----   Message from Marchia Bond sent at 10/13/2016 10:34 AM EDT ----- Regarding: Dm f/u labs Thurs 4/5, need orders. Thanks! :-) Please order future dm f/u labs for pt's upcoming lab appt. Thanks Aniceto Boss

## 2016-10-31 ENCOUNTER — Encounter: Payer: Self-pay | Admitting: Family Medicine

## 2016-10-31 ENCOUNTER — Ambulatory Visit (INDEPENDENT_AMBULATORY_CARE_PROVIDER_SITE_OTHER): Payer: Medicare Other | Admitting: Family Medicine

## 2016-10-31 VITALS — BP 120/70 | HR 88 | Temp 97.8°F | Ht 66.0 in | Wt 269.2 lb

## 2016-10-31 DIAGNOSIS — E1165 Type 2 diabetes mellitus with hyperglycemia: Secondary | ICD-10-CM

## 2016-10-31 DIAGNOSIS — I10 Essential (primary) hypertension: Secondary | ICD-10-CM

## 2016-10-31 DIAGNOSIS — IMO0001 Reserved for inherently not codable concepts without codable children: Secondary | ICD-10-CM

## 2016-10-31 DIAGNOSIS — E7800 Pure hypercholesterolemia, unspecified: Secondary | ICD-10-CM | POA: Diagnosis not present

## 2016-10-31 LAB — HM DIABETES FOOT EXAM

## 2016-10-31 MED ORDER — NYSTATIN 100000 UNIT/GM EX CREA: 1.0000 "application " | TOPICAL_CREAM | Freq: Three times a day (TID) | CUTANEOUS | 2 refills | Status: DC

## 2016-10-31 MED ORDER — PRAVASTATIN SODIUM 40 MG PO TABS: ORAL_TABLET | ORAL | 3 refills | Status: DC

## 2016-10-31 NOTE — Progress Notes (Signed)
Subjective:    Patient ID: Kaitlin Hamilton, female    DOB: 22-Oct-1950, 66 y.o.   MRN: [id number]  HPI   66 year old female presents for follow up DM.  She recently passed kidney stone on 3/15.  Pain is improved, no fever.   Diabetes:   Worsened control lately on  onglyza,actos max 45 mg daily, glipizide max  SE to metformin. Lab Results  Component Value Date   HGBA1C 7.8 (H) 10/23/2016  Using medications without difficulties: Hypoglycemic episodes:none Hyperglycemic episodes:yes Feet problems:none Blood Sugars averaging: 153-217 eye exam within last year: yes.. 12/2015  Wt Readings from Last 3 Encounters:  10/31/16 269 lb 4 oz (122.1 kg)  10/03/16 271 lb 12.8 oz (123.3 kg)  07/08/16 277 lb 12 oz (126 kg)    Elevated Cholesterol:  Improved control on pravastatin 20 mg daily. Lab Results  Component Value Date   CHOL 208 (H) 10/23/2016   HDL 66.50 10/23/2016   LDLCALC 112 (H) 10/23/2016   LDLDIRECT 173.0 11/30/2014   TRIG 145.0 10/23/2016   CHOLHDL 3 10/23/2016  Using medications without problems:none Muscle aches:  none Diet compliance: moderate Exercise: poor, plans water exercise, she is limited with respiratory issues. Other complaints:  Hypertension:   Good control on   BP Readings from Last 3 Encounters:  10/31/16 120/70  10/03/16 126/72  09/19/16 126/74  Using medication without problems or lightheadedness:  none Chest pain with exertion: none Edema: stable Short of breath: gradually worsening.. Followed by pulm. Average home BPs: Good. Other issues:  Review of Systems  Constitutional: Positive for fatigue. Negative for fever.  HENT: Negative for ear pain.   Eyes: Negative for pain.  Respiratory: Positive for shortness of breath. Negative for chest tightness.   Cardiovascular: Negative for chest pain, palpitations and leg swelling.  Gastrointestinal: Negative for abdominal pain.  Genitourinary: Negative for dysuria.       Objective:   Physical Exam    Constitutional: Vital signs are normal. She appears well-developed and well-nourished. She is cooperative.  Non-toxic appearance. She does not appear ill. No distress.  HENT:  Head: Normocephalic.  Right Ear: Hearing, tympanic membrane, external ear and ear canal normal. Tympanic membrane is not erythematous, not retracted and not bulging.  Left Ear: Hearing, tympanic membrane, external ear and ear canal normal. Tympanic membrane is not erythematous, not retracted and not bulging.  Nose: No mucosal edema or rhinorrhea. Right sinus exhibits no maxillary sinus tenderness and no frontal sinus tenderness. Left sinus exhibits no maxillary sinus tenderness and no frontal sinus tenderness.  Mouth/Throat: Uvula is midline, oropharynx is clear and moist and mucous membranes are normal.  Eyes: Conjunctivae, EOM and lids are normal. Pupils are equal, round, and reactive to light. Lids are everted and swept, no foreign bodies found.  Neck: Trachea normal and normal range of motion. Neck supple. Carotid bruit is not present. No thyroid mass and no thyromegaly present.  Cardiovascular: Normal rate, regular rhythm, S1 normal, S2 normal, normal heart sounds, intact distal pulses and normal pulses.  Exam reveals no gallop and no friction rub.   No murmur heard. Pulmonary/Chest: Effort normal. No tachypnea. No respiratory distress. She has decreased breath sounds. She has no wheezes. She has no rhonchi. She has rales in the right lower field and the left lower field.  Abdominal: Soft. Normal appearance and bowel sounds are normal. There is no tenderness.  Neurological: She is alert.  Skin: Skin is warm, dry and intact. No rash noted.  Psychiatric: Her speech is normal and behavior is normal. Judgment and thought content normal. Her mood appears not anxious. Cognition and memory are normal. She does not exhibit a depressed mood.      Diabetic foot exam: Normal inspection No skin breakdown Mild calluses   Normal DP pulses Normal sensation to light touch and monofilament Nails normal     Assessment & Plan:

## 2016-10-31 NOTE — Progress Notes (Signed)
Pre visit review using our clinic review tool, if applicable. No additional management support is needed unless otherwise documented below in the visit note. 

## 2016-10-31 NOTE — Assessment & Plan Note (Signed)
Well controlled. Continue current medication.  

## 2016-10-31 NOTE — Assessment & Plan Note (Addendum)
Improved cotnrol since last OV but not at goal LDL < 100. Encouraged exercise, weight loss, healthy eating habits. Increase pravastatin to 40 mg daily.

## 2016-10-31 NOTE — Patient Instructions (Addendum)
Work on healthy low carb, low chol diet.  Exercise in pool is a go idea if you are able.  Increase cholesterol med pravastatin to 40 mg daily. Decreased sweets in diet, decrease starchy veggies, sweet tea, potatos. Keep working on weight loss.

## 2016-10-31 NOTE — Assessment & Plan Note (Signed)
Inadequate control.. Work on aggressive lifestyle change.. If A1C is not at goal in 3 months..  We will cahnge medication regimen.

## 2016-11-02 DIAGNOSIS — J8489 Other specified interstitial pulmonary diseases: Secondary | ICD-10-CM | POA: Diagnosis not present

## 2016-11-04 ENCOUNTER — Telehealth: Payer: Self-pay | Admitting: Pulmonary Disease

## 2016-11-04 NOTE — Telephone Encounter (Signed)
Dr. [name]  Please Advise-  Pt called concerned because she is noticing that she is running out of her tanks faster. She reached out to [facility] to see if they could change her delivery date from every 2 weeks to every week. She states they informed her that an order with documentation needs to indicate pt's needing a delivery sooner.

## 2016-11-05 NOTE — Telephone Encounter (Signed)
Needs to come in for qualifying walk so we can get it right. Easiest option is to do 6MW at [facility], doesn't need office visit.

## 2016-11-05 NOTE — Telephone Encounter (Signed)
LMTCB

## 2016-11-06 NOTE — Telephone Encounter (Signed)
Called and spoke to pt. Informed her of the recs per BQ. Appt scheduled on [facility] [place] schedule for 11/12/16. Pt verbalized understanding and denied any further questions or concerns at this time.   Will forward to [facility] as FYI.

## 2016-11-13 ENCOUNTER — Ambulatory Visit (INDEPENDENT_AMBULATORY_CARE_PROVIDER_SITE_OTHER): Payer: Medicare Other | Admitting: *Deleted

## 2016-11-13 DIAGNOSIS — J9611 Chronic respiratory failure with hypoxia: Secondary | ICD-10-CM

## 2016-11-13 NOTE — Progress Notes (Signed)
Titration walk done per BM request.

## 2016-11-17 ENCOUNTER — Telehealth: Payer: Self-pay | Admitting: Pulmonary Disease

## 2016-11-17 NOTE — Telephone Encounter (Signed)
BQ  Please Advise-  Pt's daughter called her and the pt was very upset about when she had her 6 min walk in [place]. She stated pt informed her that first they made her do the walk on RA after pt repeatly stated that you never allow her to walk without oxygen and she felt like this was unsafe. Pt feels like the results will show that she is worse but are not correct. They stated they felt the staff were rude and did not care for her and that they will never go to that clinic again. Daughter was requesting to speak with you. She is aware that you are not available  at all this week and she stated that this was fine and that you could call her once you are back.

## 2016-11-24 NOTE — Telephone Encounter (Signed)
I returned daughter's call, left message

## 2016-11-24 NOTE — Telephone Encounter (Signed)
lmtcb x1 for pt's daughter, [name].

## 2016-11-24 NOTE — Telephone Encounter (Signed)
Please make sure Kaitlin Hamilton is aware of this issue  Reschedule 6MW with oxygen in [facility]

## 2016-11-26 NOTE — Telephone Encounter (Signed)
OK sounds good.

## 2016-11-26 NOTE — Telephone Encounter (Signed)
BQ  Spoke with pt and she stated she does have a follow up appt on 12/18/16 and she stated if this is easier for you than they can discuss this with you on that day.

## 2016-11-27 ENCOUNTER — Other Ambulatory Visit: Payer: Self-pay | Admitting: Family Medicine

## 2016-11-27 NOTE — Telephone Encounter (Signed)
LM for [name] Need to reschedule 6MW in [place] with oxygen

## 2016-11-27 NOTE — Telephone Encounter (Signed)
Called spoke with patient's daughter [name] 6MW w/ O2 scheduled for same day as the ov w/ BQ on 5.31.18 @ 1130 Nothing further needed; will sign off

## 2016-11-27 NOTE — Telephone Encounter (Signed)
Kaitlin Hamilton, patient's daughter, returned call [phone].

## 2016-12-02 DIAGNOSIS — J8489 Other specified interstitial pulmonary diseases: Secondary | ICD-10-CM | POA: Diagnosis not present

## 2016-12-18 ENCOUNTER — Ambulatory Visit: Payer: Medicare Other | Admitting: Pulmonary Disease

## 2016-12-18 ENCOUNTER — Ambulatory Visit (INDEPENDENT_AMBULATORY_CARE_PROVIDER_SITE_OTHER): Payer: Medicare Other | Admitting: Adult Health

## 2016-12-18 ENCOUNTER — Ambulatory Visit (INDEPENDENT_AMBULATORY_CARE_PROVIDER_SITE_OTHER)
Admission: RE | Admit: 2016-12-18 | Discharge: 2016-12-18 | Disposition: A | Discharge status: Home / Self Care | Payer: Medicare Other | Source: Ambulatory Visit | Attending: Adult Health | Admitting: Adult Health

## 2016-12-18 ENCOUNTER — Ambulatory Visit (INDEPENDENT_AMBULATORY_CARE_PROVIDER_SITE_OTHER): Payer: Medicare Other | Admitting: *Deleted

## 2016-12-18 ENCOUNTER — Encounter: Payer: Self-pay | Admitting: Adult Health

## 2016-12-18 VITALS — BP 124/66 | HR 85

## 2016-12-18 DIAGNOSIS — R0609 Other forms of dyspnea: Principal | ICD-10-CM

## 2016-12-18 DIAGNOSIS — J84112 Idiopathic pulmonary fibrosis: Secondary | ICD-10-CM

## 2016-12-18 DIAGNOSIS — R079 Chest pain, unspecified: Secondary | ICD-10-CM | POA: Diagnosis not present

## 2016-12-18 DIAGNOSIS — J9611 Chronic respiratory failure with hypoxia: Secondary | ICD-10-CM

## 2016-12-18 DIAGNOSIS — G4733 Obstructive sleep apnea (adult) (pediatric): Secondary | ICD-10-CM | POA: Diagnosis not present

## 2016-12-18 NOTE — Progress Notes (Signed)
_0  ID: Kaitlin Hamilton, female    DOB: 09-05-1950, 66 y.o.   MRN: [id number]  Chief Complaint  Patient presents with  . Follow-up    ILD     Referring provider: [name], MD  HPI: 66 year old female followed for interstitial lung disease (UIP)  And OSA on CPAP   TEST  February 2014 chest x-ray at [facility] normal   11/2012 CT chest >> ([facility] read) centrilobular nodules, interlobular septal thickening worse in bases and periphery R lung > L; some GGO in bases and periphery as well, some bronchiectasis in the bases R > L; findings suggestive of fibrosis but not UIP; question hypersensitivity pneumonitis given centrilobular nodules; also question aspiration  03/2013 ANA, ANCA, Anti-Jo-1, ESR, RF, SCL-70, anti-centromere, SSA/SSB, all negative; CRP 0.6;  04/2013 Barium swallow> abnormal esophageal motility, GERD, hiatal hernia  04/2013 CT chest ([name])> findings suggestive of but not diagnostic of NSIP, small pulmonary nodule  04/2013 HP panel >> 05/17/2013 3 month prednisone trial 06/2013 open lung biopsy> UIP    February 2016 CT chest high resolution> slight progression in reticular abnormalities consistent with pulmonary fibrosis, uip  6 minute walk 10/19/2012 walked 500 feet in office on room air oxygenation did not drop below 90% 04/2013 6MW RA > 1100 feet, HR peak 109, O2 sat Nadir 87% 11/2013 6MW 1364 feet, O2 saturation nadir 78% 08/21/14 6MW 1400 feet 90% on 8L  PFT February 2014 simple spirometry performed by her primary care physician>> ratio 90%, FEV1 1.71 L (64% predicted, FVC 1.83 L 55% predicted; flow volume loop is not consistent with obstruction 11/15/2012 Full PFT [name]> Ratio 87%, FEV1 2.00L > 2.07 with bronchodilator (3% change); TLC 3.44 L (65% pred), ERV 0.62 (57% pred), DLCO 15.1 ml/mmHg/min (59% pred) 04/2013 Full PFT> Ratio 93%, FEV1 1.92 L (71% pred), TLC 3.02L (56% pred), DLCO 15.94 (59% pred) May 2015 full pulmonary function test ratio  93%, FEV1 1.71 L (64% predicted, no change with bronchodilator), total lung capacity 2.67 L (49% predicted), DLCO 13.11 (40% predicted) 03/2015 PFT > Ratio 93%, FEV1 1.24L, FVC 1.33L (38% pred), TLC 2.56L (47% pred), DLCO 12.10 (44% pred) March 2017 pulmonary function testing ratio 93%, FVC (41% predicted), total lung capacity 2.34 L (43% protected), DLCO 10.53 (39% protected). November 2017 pulmonary function testing ratio 92%, FVC 1.26 L, 37% predicted, total lung capacity 2.37 L 44% predicted, DLCO 5. 02/27/2020 percent predicted  12/18/2016 Follow up : ILD and OSA  Patient returns for a three-month follow-up. Patient says overall that her breathing is doing okay. She remains on oxygen at 4 L at rest. She does use up to 6 L with walking and activity. 6 minute walk test today showed patient needed 6 L to keep O2 saturations greater than 88%. Patient was able to walk 288 m. Patient continues to get winded if she walks for long distance. She also has chronic cough. This seems to be worse when she eats. Occasionally has pain along her right rib cage. Along with chest tightness. She denies any exertional chest pain, jaw pain, syncope or palpitations.  She remains on Cellcept and Esbriet . LFT in April were nml . Due for labs next month with PCP .   Patient remains on C Pap. Patient says she wears it most nights. She feels rested. Download shows good compliance is 63%. AHI 0.7. With average usage around 7 hours.  Allergies  Allergen Reactions  . Adhesive [Tape]     Redness, bruising with any  adhesives- bandaids, patches, etc.   . Atovaquone Itching  . Codeine Hives and Swelling  . Demerol [Meperidine] Hives and Swelling  . Hydrocodone Nausea Only  . Sulfa Antibiotics Rash    Immunization History  Administered Date(s) Administered  . Influenza Split 05/02/2005, 11/28/2014  . Influenza Whole 04/20/2012  . Influenza,inj,Quad PF,36+ Mos 05/17/2013, 04/04/2014, 10/06/2014, 05/11/2015, 10/19/2015,  03/12/2016  . Influenza-Unspecified 04/20/2013  . Pneumococcal Conjugate-13 04/04/2014  . Pneumococcal Polysaccharide-23 04/20/2013    Past Medical History:  Diagnosis Date  . Allergic rhinitis    uses Flonase daily  . Anesthesia complication    Per pt/ past endoscopy in 2015, the anesthetic spray in back throat caused her to have breathing problems  . Anxiety   . Bronchitis   . Chicken pox   . Constipation    takes Fiber daily  . Depression   . Diverticulosis   . Dysphagia   . GERD (gastroesophageal reflux disease)    takes Protonix daily  . H/O hiatal hernia   . Hemorrhoids   . History of bronchitis    2013  . History of colon polyps   . History of kidney stones   . Hyperlipidemia    lost 43 pounds and no meds required at present  . Hypertension    takes Verapamil and HCTZ daily  . Hypokalemia   . Hypothyroidism (acquired)    takes Synthroid daily  . Insomnia    doesn't take any meds for this  . Joint swelling    left thumb  . Migraines    last one about a month ago  . Non-insulin dependent type 2 diabetes mellitus ([place])    takes Glipizide daily  . OA (osteoarthritis)    left knee  . Oxygen deficiency   . Oxygen dependent    Pt is on continuous O2 at 3 liters!  . Pneumonia    last time in 2006  . PONV (postoperative nausea and vomiting)   . Shortness of breath    with exertion;takes Singulair daily as well as Flonase  . Urinary frequency     Tobacco History: History  Smoking Status  . Never Smoker  Smokeless Tobacco  . Never Used   Counseling given: Not Answered   Outpatient Encounter Prescriptions as of 12/18/2016  Medication Sig  . acetaminophen (TYLENOL) 500 MG tablet Take 1,000 mg by mouth every 6 (six) hours as needed for moderate pain.  Marland Kitchen diclofenac (VOLTAREN) 75 MG EC tablet Take 1 tablet by mouth two  times daily  . diphenhydrAMINE (BENADRYL) 25 mg capsule Take 25 mg by mouth as needed.  Marland Kitchen FIBER PO Take 1 tablet by mouth daily.  .  fluticasone (FLONASE) 50 MCG/ACT nasal spray Place 1 spray into both nostrils daily.  Marland Kitchen glipiZIDE (GLUCOTROL) 10 MG tablet TAKE 1 TABLET BY MOUTH  TWICE A DAY BEFORE A MEAL  . levothyroxine (SYNTHROID, LEVOTHROID) 125 MCG tablet TAKE 1 TABLET BY MOUTH  DAILY BEFORE BREAKFAST  . losartan-hydrochlorothiazide (HYZAAR) 50-12.5 MG tablet Take 1 tablet by mouth daily.  . montelukast (SINGULAIR) 10 MG tablet Take 1 tablet (10 mg total) by mouth at bedtime.  . mycophenolate (CELLCEPT) 500 MG tablet Take 1 tablet (500 mg total) by mouth daily.  . NON FORMULARY Place 3 L into the nose daily. 4-5 Liters with exertion  . nystatin cream (MYCOSTATIN) Apply 1 application topically 3 (three) times daily.  . ONE TOUCH ULTRA TEST test strip   . ONGLYZA 5 MG TABS tablet TAKE 1  TABLET BY MOUTH  DAILY  . pantoprazole (PROTONIX) 40 MG tablet TAKE 1 TABLET BY MOUTH TWO  TIMES DAILY  . pioglitazone (ACTOS) 45 MG tablet Take 1 tablet (45 mg total) by mouth daily.  . Pirfenidone (ESBRIET) 801 MG TABS Take 1 tablet by mouth 3 (three) times daily.  . pravastatin (PRAVACHOL) 40 MG tablet Take 1 tablet (40 mg total) by mouth daily.  . verapamil (CALAN-SR) 180 MG CR tablet TAKE 1 TABLET BY MOUTH  DAILY   No facility-administered encounter medications on file as of 12/18/2016.      Review of Systems  Constitutional:   No  weight loss, night sweats,  Fevers, chills, + fatigue, or  lassitude.  HEENT:   No headaches,  Difficulty swallowing,  Tooth/dental problems, or  Sore throat,                No sneezing, itching, ear ache, nasal congestion, post nasal drip,   CV:  No chest pain,  Orthopnea, PND, swelling in lower extremities, anasarca, dizziness, palpitations, syncope.   GI  No heartburn, indigestion, abdominal pain, nausea, vomiting, diarrhea, change in bowel habits, loss of appetite, bloody stools.   Resp:   No chest wall deformity  Skin: no rash or lesions.  GU: no dysuria, change in color of urine, no  urgency or frequency.  No flank pain, no hematuria   MS:  No joint pain or swelling.  No decreased range of motion.  No back pain.    Physical Exam  BP 124/66 (BP Location: Left Arm, Cuff Size: Normal)   Pulse 85   SpO2 99%   GEN: A/Ox3; pleasant , NAD, obese on O2    HEENT:  [place]/AT,  EACs-clear, TMs-wnl, NOSE-clear, THROAT-clear, no lesions, no postnasal drip or exudate noted.   NECK:  Supple w/ fair ROM; no JVD; normal carotid impulses w/o bruits; no thyromegaly or nodules palpated; no lymphadenopathy.    RESP  BB crackles  no accessory muscle use, no dullness to percussion  CARD:  RRR, no m/r/g, no peripheral edema, pulses intact, no cyanosis or clubbing.  GI:   Soft & nt; nml bowel sounds; no organomegaly or masses detected.   Musco: Warm bil, no deformities or joint swelling noted.   Neuro: alert, no focal deficits noted.    Skin: Warm, no lesions or rashes    Lab Results:    BNP No results found for: BNP  ProBNP No results found for: PROBNP  Imaging: Dg Chest 2 View  Result Date: 12/18/2016 CLINICAL DATA:  66 year old female with chest tightness and right posterior chest pain for 1 month. Status post right VATS in 2014. Chronic interstitial lung disease. EXAM: CHEST  2 VIEW COMPARISON:  High-resolution chest CT 08/21/2014 and earlier. FINDINGS: Bilateral reduced volumes are stable compared to 2015 chest radiographs. Stable mild cardiomegaly. Other mediastinal contours are within normal limits. Visualized tracheal air column is within normal limits. Basilar predominant increased pulmonary interstitial markings are stable since 2015. No pneumothorax, pulmonary edema, pleural effusion or confluent pulmonary opacity. Levoconvex lower thoracic scoliosis. No acute osseous abnormality identified. Stable cholecystectomy clips. Negative visible bowel gas pattern. IMPRESSION: Chronic interstitial lung disease appears stable since 2015. No superimposed acute findings are  identified. Electronically Signed   By: Genevie Ann M.D.   On: 12/18/2016 13:34     Assessment & Plan:   OSA (obstructive sleep apnea) Well controlled on CPAP  Cont on CPAP At bedtime    UIP (usual interstitial pneumonitis) ([place])  Compensated on present regimen . Slight increased cough - check cxr today .  Cont to monitor  6 min walk shows need O2 at 6l/m walking .  Able to walk 288 m which was improved from previous .    Chronic hypoxemic respiratory failure (HCC) Use on O2 at 4l/m rest and 6l/m with act  DME called to make sure she has enough tanks .      Rexene Edison, NP 12/18/2016

## 2016-12-18 NOTE — Assessment & Plan Note (Addendum)
Compensated on present regimen . Slight increased cough - check cxr today .  Cont to monitor  6 min walk shows need O2 at 6l/m walking .  Able to walk 288 m which was improved from previous .

## 2016-12-18 NOTE — Patient Instructions (Addendum)
Keep taking Esbriet as you are doing Keep taking CellCept as you're doing Continue on Oxygen 4l/m rest and 6l/m walking .  Continue on CPAP At bedtime   Chest xray today .  Get labs done next months as planned.  follow up with Dr. [name] in 3 months and As needed   Please contact office for sooner follow up if symptoms do not improve or worsen or seek emergency care

## 2016-12-18 NOTE — Assessment & Plan Note (Signed)
Use on O2 at 4l/m rest and 6l/m with act  DME called to make sure she has enough tanks .

## 2016-12-18 NOTE — Assessment & Plan Note (Signed)
Well controlled on CPAP  Cont on CPAP At bedtime

## 2016-12-18 NOTE — Progress Notes (Signed)
SIX MIN WALK 12/18/2016 11/13/2016 09/19/2016 06/11/2016 05/13/2016 10/15/2015 03/01/2015  Medications FLonase 37mcg, levothyroxine,cellcept 500mg ,nystatin 100000 unit,protonix 40mg ,pirfenidone,saxagliptin 5mg ,calan 180mg   - Patient took all meds on her medication list except ones that stated to take QHS Voltaren 75mg , Flonase, Glipizide 10mg , Synthriod 13mcg, Hyzaar 50-12.5mg , Singulair 10mg , Cellcept 500mg , Nystatin 100000 units/gm, Protonix 40mg , Actos 45mg , Esbriet 801mg , Pravastatin 20mg , Onglyza 5mg , Calan 180mg  - all meds were taken at 0730 - Took albuterol after PFT Fiber, Glipizide, Levothyroxine, Hyzaar, Singulair, Cellcept,Nystatin Cream, Protonix, Actos, Pirfenidone, Onglyza, Verapamil  Supplimental Oxygen during Test? (L/min) Yes Yes Yes Yes Yes Yes Yes  O2 Flow Rate 6 8 4 3 5 4 4   Type Continuous Continuous Continuous Continuous Continuous Continuous Continuous  Laps 5 - 3 4 - 3 6  Partial Lap (in Meters) 0 - 0 0 - 0 0  Baseline BP (sitting) 130/72 - 132/80 152/94 - 118/62 128/72  Baseline Heartrate 87 - 94 83 - 79 102  Baseline Dyspnea (Borg Scale) 0.5 - 1 0 - 0 5  Baseline Fatigue (Borg Scale) 0 - 4 0 - 0 0  Baseline SPO2 98 - 100 98 - 97 94  BP (sitting) 136/80 - 132/68 210/76 - 142/78 162/68  Heartrate 113 - 116 118 - 106 133  Dyspnea (Borg Scale) 3 - 5 82 - 4 7  Fatigue (Borg Scale) 2 - 9 5 - 3 6  SPO2 92 - 79 5 - 88 84  BP (sitting) 132/74 - 126/72 154/84 - 132/78 148/66  Heartrate 94 - 89 90 - 75 99  SPO2 97 - 99 98 - 99 97  Stopped or Paused before Six Minutes Yes - Yes Yes - Yes Yes  Other Symptoms at end of Exercise Pt started off walk on 4L pt stopped due to fatigue and dayspnea after lap 2 stats were checked O2 84% 112HR, O2 bumped to 5L pt got back up to 93% 100 HR and walk was continued, pt needed to stop again after lap 3 due to dyspnea stats were checked O2 was 87% 116 HR, bumped up to 6L, 6L got pt up to 94% 117 HR, 6L helped maintained pt above 90% for the rest of walk  - pt stopped at 3:38min left on the clock d/t increased SOB and low O2 saturations.  Pt stopped walk early d/t dyspnea. Pt stopped with 2:54 left on the clock. - Sciatic pain in Right Leg/ stopped with 3:46 left on clock 2 times for extreme SOB; 15 seconds and 10 seconds  Interpretation Leg pain - - Leg pain - Calf pain;Leg pain -  Distance Completed 240 - 144 192 - 144 288  Tech Comments: Pt walked a steady pace with oxygen tank, she was able to complete walk, pt did express dyspnea, and slight chest tightness during walk Pt was started out w/o 02 98% oxygen hr 103. At 185 ft 02 85 hr 131 8 L; patient walked continuous 02 titrated final lap 6 02 91% hr 114 on 8L. pt stopped at 3:29min left on the clock d/t increased SOB and low O2 saturations. pt was allowed to sit and rest for a few minutes and was then placed back on 4 liters and an ambulatory walk/O2 titration was done to titrate O2. Pt ended up needing 6 liters O2 to maintain her O2 saturations above 88%.  --amg - did not complete lap 3 d/t dyspnea per pt.  patient stopped walk after with 3:46 minutes left on clock due to Sciatic nerve pain  down right leg. BQ aware of results. pt started at  a moderate pace and slowed a little as time went on all while pull O2 tank on wheels with her.

## 2017-01-02 DIAGNOSIS — J8489 Other specified interstitial pulmonary diseases: Secondary | ICD-10-CM | POA: Diagnosis not present

## 2017-01-08 DIAGNOSIS — H3540 Unspecified peripheral retinal degeneration: Secondary | ICD-10-CM | POA: Diagnosis not present

## 2017-01-08 DIAGNOSIS — E119 Type 2 diabetes mellitus without complications: Secondary | ICD-10-CM | POA: Diagnosis not present

## 2017-01-08 DIAGNOSIS — H3589 Other specified retinal disorders: Secondary | ICD-10-CM | POA: Diagnosis not present

## 2017-01-08 DIAGNOSIS — Z7984 Long term (current) use of oral hypoglycemic drugs: Secondary | ICD-10-CM | POA: Diagnosis not present

## 2017-01-08 LAB — HM DIABETES EYE EXAM

## 2017-01-19 ENCOUNTER — Encounter: Payer: Self-pay | Admitting: Family Medicine

## 2017-01-27 ENCOUNTER — Other Ambulatory Visit (INDEPENDENT_AMBULATORY_CARE_PROVIDER_SITE_OTHER): Payer: Medicare Other

## 2017-01-27 ENCOUNTER — Telehealth: Payer: Self-pay | Admitting: Family Medicine

## 2017-01-27 DIAGNOSIS — E1165 Type 2 diabetes mellitus with hyperglycemia: Secondary | ICD-10-CM

## 2017-01-27 DIAGNOSIS — E039 Hypothyroidism, unspecified: Secondary | ICD-10-CM

## 2017-01-27 DIAGNOSIS — IMO0001 Reserved for inherently not codable concepts without codable children: Secondary | ICD-10-CM

## 2017-01-27 LAB — COMPREHENSIVE METABOLIC PANEL
ALBUMIN: 4 g/dL (ref 3.5–5.2)
ALT: 11 U/L (ref 0–35)
AST: 12 U/L (ref 0–37)
Alkaline Phosphatase: 76 U/L (ref 39–117)
BUN: 15 mg/dL (ref 6–23)
CHLORIDE: 96 meq/L (ref 96–112)
CO2: 33 mEq/L — ABNORMAL HIGH (ref 19–32)
CREATININE: 0.74 mg/dL (ref 0.40–1.20)
Calcium: 9.9 mg/dL (ref 8.4–10.5)
GFR: 83.41 mL/min (ref >60.00)
Glucose, Bld: 202 mg/dL — ABNORMAL HIGH (ref 70–99)
Potassium: 4.5 mEq/L (ref 3.5–5.1)
SODIUM: 137 meq/L (ref 135–145)
Total Bilirubin: 0.4 mg/dL (ref 0.2–1.2)
Total Protein: 7.2 g/dL (ref 6.0–8.3)

## 2017-01-27 LAB — T4, FREE: Free T4: 1.04 ng/dL (ref 0.60–1.60)

## 2017-01-27 LAB — LIPID PANEL
CHOLESTEROL: 193 mg/dL (ref 0–200)
HDL: 61.7 mg/dL (ref >39.00)
LDL CALC: 101 mg/dL — AB (ref 0–99)
NonHDL: 131.05
Total CHOL/HDL Ratio: 3
Triglycerides: 151 mg/dL — ABNORMAL HIGH (ref 0.0–149.0)
VLDL: 30.2 mg/dL (ref 0.0–40.0)

## 2017-01-27 LAB — HEMOGLOBIN A1C: HEMOGLOBIN A1C: 7.5 % — AB (ref 4.6–6.5)

## 2017-01-27 LAB — T3, FREE: T3, Free: 3.4 pg/mL (ref 2.3–4.2)

## 2017-01-27 LAB — TSH: TSH: 2.73 u[IU]/mL (ref 0.35–4.50)

## 2017-01-27 NOTE — Telephone Encounter (Signed)
-----   Message from Ellamae Sia sent at 01/23/2017  9:38 AM EDT ----- Regarding: Lab orders for Tuesday, 7.10.18 Lab orders for a f/u appt

## 2017-01-30 ENCOUNTER — Encounter: Payer: Self-pay | Admitting: Family Medicine

## 2017-01-30 ENCOUNTER — Ambulatory Visit (INDEPENDENT_AMBULATORY_CARE_PROVIDER_SITE_OTHER): Payer: Medicare Other | Admitting: Family Medicine

## 2017-01-30 VITALS — BP 100/62 | HR 78 | Temp 97.8°F | Ht 66.0 in | Wt 271.0 lb

## 2017-01-30 DIAGNOSIS — E1165 Type 2 diabetes mellitus with hyperglycemia: Secondary | ICD-10-CM

## 2017-01-30 DIAGNOSIS — E039 Hypothyroidism, unspecified: Secondary | ICD-10-CM | POA: Diagnosis not present

## 2017-01-30 DIAGNOSIS — I10 Essential (primary) hypertension: Secondary | ICD-10-CM | POA: Diagnosis not present

## 2017-01-30 DIAGNOSIS — E7800 Pure hypercholesterolemia, unspecified: Secondary | ICD-10-CM

## 2017-01-30 DIAGNOSIS — M79605 Pain in left leg: Secondary | ICD-10-CM | POA: Diagnosis not present

## 2017-01-30 DIAGNOSIS — IMO0001 Reserved for inherently not codable concepts without codable children: Secondary | ICD-10-CM

## 2017-01-30 LAB — HM DIABETES FOOT EXAM

## 2017-01-30 MED ORDER — NYSTATIN 100000 UNIT/GM EX CREA: 1.0000 "application " | TOPICAL_CREAM | Freq: Three times a day (TID) | CUTANEOUS | 2 refills | Status: DC

## 2017-01-30 MED ORDER — METFORMIN HCL ER 500 MG PO TB24: 500.0000 mg | ORAL_TABLET | Freq: Every day | ORAL | 11 refills | Status: DC

## 2017-01-30 NOTE — Patient Instructions (Addendum)
Stop glipizide twice daily.. Start low dose metformin once daily , in AMs.  Continue pravastatin 40 mg.  Work on low [employer], exercsie as tolerated and weight loss.

## 2017-01-30 NOTE — Assessment & Plan Note (Signed)
Well controlled. Continue current medication.  

## 2017-01-30 NOTE — Assessment & Plan Note (Signed)
Improiving control but not at goal.  She is not sure if she had true SE to metformin inpast and is willing to try trial. Stop glipizide and change to metformin. Continue onglyza and actos at max.  She is hesitant about insulin or injectable meds.

## 2017-01-30 NOTE — Assessment & Plan Note (Signed)
Now at goal on pravastatin 40 mg daily. LDL < 100.

## 2017-01-30 NOTE — Assessment & Plan Note (Signed)
No sign of infection, rash, knee or ankle pain, gout, vascular issue.  Most likely pain referred from back . Start low back stretches and heat.. Can use diclofenac she has for pain   HX of sciatica in right leg.

## 2017-01-30 NOTE — Progress Notes (Signed)
Subjective:    Patient ID: Kaitlin Hamilton, female    DOB: June 18, 1951, 66 y.o.   MRN: [id number]  HPI   66 year old female presents for 3 month follow up.  Diabetes:   At last OV encouraged pt to aggressively work on lifestyle changes. She has had some improvement in A1C but minimal. Not yet at goal < 7. No soda, less sweet tea, decreased potatos  On glipizide max, onglmetfomrinyza 5 mg daily max,  actos max  SE to metformin in past she thinks, but not sure.  Hesitant to do injections. Lab Results  Component Value Date   HGBA1C 7.5 (H) 01/27/2017  Using medications without difficulties: Hypoglycemic episodes: none Hyperglycemic episodes: occ Feet problems: no ulcers Blood Sugars averaging: FBS 143-200 eye exam within last year: yes   NML renal function. Wt Readings from Last 3 Encounters:  01/30/17 271 lb (122.9 kg)  10/31/16 269 lb 4 oz (122.1 kg)  10/03/16 271 lb 12.8 oz (123.3 kg)     Elevated Cholesterol: At last OV increased pravastaitn yto 40 mg daily .Marland Kitchen Now almost at goal LDL < 100. Lab Results  Component Value Date   CHOL 193 01/27/2017   HDL 61.70 01/27/2017   LDLCALC 101 (H) 01/27/2017   LDLDIRECT 173.0 11/30/2014   TRIG 151.0 (H) 01/27/2017   CHOLHDL 3 01/27/2017  Using medications without problems: Muscle aches:  Diet compliance: Exercise: Other complaints:  Hypertension:    Good control on losartan HCTZ, verapamil BP Readings from Last 3 Encounters:  01/30/17 100/62  12/18/16 124/66  10/31/16 120/70  Using medication without problems or lightheadedness:  Chest pain with exertion: Edema: Short of breath: Average home BPs: Other issues:    Blood pressure 100/62, pulse 78, temperature 97.8 F (36.6 C), temperature source Oral, height 5\' 6"  (1.676 m), weight 271 lb (122.9 kg), SpO2 98 %.   Review of Systems  Constitutional: Negative for fatigue and fever.  HENT: Negative for ear pain.   Eyes: Negative for pain.  Respiratory: Positive for  shortness of breath. Negative for chest tightness.   Cardiovascular: Negative for chest pain, palpitations and leg swelling.  Gastrointestinal: Negative for abdominal pain.  Genitourinary: Negative for dysuria.       Objective:   Physical Exam  Constitutional: Vital signs are normal. She appears well-developed and well-nourished. She is cooperative.  Non-toxic appearance. She does not appear ill. No distress.  On oxygen  HENT:  Head: Normocephalic.  Right Ear: Hearing, tympanic membrane, external ear and ear canal normal. Tympanic membrane is not erythematous, not retracted and not bulging.  Left Ear: Hearing, tympanic membrane, external ear and ear canal normal. Tympanic membrane is not erythematous, not retracted and not bulging.  Nose: No mucosal edema or rhinorrhea. Right sinus exhibits no maxillary sinus tenderness and no frontal sinus tenderness. Left sinus exhibits no maxillary sinus tenderness and no frontal sinus tenderness.  Mouth/Throat: Uvula is midline, oropharynx is clear and moist and mucous membranes are normal.  Eyes: Pupils are equal, round, and reactive to light. Conjunctivae, EOM and lids are normal. Lids are everted and swept, no foreign bodies found.  Neck: Trachea normal and normal range of motion. Neck supple. Carotid bruit is not present. No thyroid mass and no thyromegaly present.  Cardiovascular: Normal rate, regular rhythm, S1 normal, S2 normal, normal heart sounds, intact distal pulses and normal pulses.  Exam reveals no gallop and no friction rub.   No murmur heard. Pulmonary/Chest: Effort normal and breath sounds  normal. No tachypnea. No respiratory distress. She has no decreased breath sounds. She has no wheezes. She has no rhonchi. She has no rales.  Abdominal: Soft. Normal appearance and bowel sounds are normal. There is no tenderness.  Musculoskeletal:       Lumbar back: Normal. She exhibits normal range of motion.  Pain with SLR in left  Neurological: She  is alert.  Skin: Skin is warm, dry and intact. No rash noted.  Psychiatric: Her speech is normal and behavior is normal. Judgment and thought content normal. Her mood appears not anxious. Cognition and memory are normal. She does not exhibit a depressed mood.          Assessment & Plan:

## 2017-02-01 DIAGNOSIS — J8489 Other specified interstitial pulmonary diseases: Secondary | ICD-10-CM | POA: Diagnosis not present

## 2017-02-23 ENCOUNTER — Other Ambulatory Visit: Payer: Self-pay | Admitting: Family Medicine

## 2017-03-04 DIAGNOSIS — J8489 Other specified interstitial pulmonary diseases: Secondary | ICD-10-CM | POA: Diagnosis not present

## 2017-03-22 ENCOUNTER — Other Ambulatory Visit: Payer: Self-pay | Admitting: Pulmonary Disease

## 2017-03-24 ENCOUNTER — Other Ambulatory Visit: Payer: Self-pay | Admitting: Family Medicine

## 2017-03-24 DIAGNOSIS — J3089 Other allergic rhinitis: Secondary | ICD-10-CM

## 2017-04-02 ENCOUNTER — Other Ambulatory Visit (INDEPENDENT_AMBULATORY_CARE_PROVIDER_SITE_OTHER): Payer: Medicare Other

## 2017-04-02 ENCOUNTER — Ambulatory Visit (INDEPENDENT_AMBULATORY_CARE_PROVIDER_SITE_OTHER): Payer: Medicare Other | Admitting: Pulmonary Disease

## 2017-04-02 ENCOUNTER — Encounter: Payer: Self-pay | Admitting: Pulmonary Disease

## 2017-04-02 VITALS — BP 134/78 | HR 82 | Ht 66.0 in | Wt 267.0 lb

## 2017-04-02 DIAGNOSIS — G4733 Obstructive sleep apnea (adult) (pediatric): Secondary | ICD-10-CM | POA: Diagnosis not present

## 2017-04-02 DIAGNOSIS — Z5181 Encounter for therapeutic drug level monitoring: Secondary | ICD-10-CM

## 2017-04-02 DIAGNOSIS — J84112 Idiopathic pulmonary fibrosis: Secondary | ICD-10-CM

## 2017-04-02 LAB — HEPATIC FUNCTION PANEL
ALT: 13 U/L (ref 0–35)
AST: 11 U/L (ref 0–37)
Albumin: 4.2 g/dL (ref 3.5–5.2)
Alkaline Phosphatase: 94 U/L (ref 39–117)
BILIRUBIN TOTAL: 0.3 mg/dL (ref 0.2–1.2)
Bilirubin, Direct: 0.1 mg/dL (ref 0.0–0.3)
Total Protein: 7.3 g/dL (ref 6.0–8.3)

## 2017-04-02 NOTE — Patient Instructions (Signed)
For your obstructive sleep apnea: Keep using CPAP nightly  For your usual interstitial pneumonia: Keep taking CellCept low-dose Keep taking Esbriet as you are doing We will get a lung function test on the next visit LFT today  For your chronic respiratory failure with hypoxemia: Keep using 4 L of oxygen at rest, 6 L with exertion  We will see you back in 3 months or sooner if needed  Get a flu shot as soon as possible

## 2017-04-02 NOTE — Progress Notes (Signed)
Subjective:    Patient ID: Kaitlin Hamilton, female    DOB: [birth year], 66 y.o.   MRN: [id number]  Synopsis: Kaitlin Hamilton is a very pleasant 66 year old female who first saw the [facility] pulmonary clinic in March 2014 for evaluation of shortness of breath. She had significant cough, wheezing, and sputum production requiring multiple rounds of antibiotics. Because of crackles on lung exam and an abnormal pulmonary function test she was referred to [place]. We ordered full pulmonary function testing which showed moderate restriction and a depressed DLCO in proportion to her restriction. There is no airflow obstruction and no change with bronchodilator administration. A chest x-ray performed in February 2014 was read as normal.  An open lung biopsy was performed in December 2014 showing UIP.  Because she has had multiple good responses to steroids, we have treated her with cellcept and prednisone.  In January 2015 she had severe insomnia, hallucinations and gastritis on high dose prednisone.  She had a rash to bactrim. Since February 2015 she has been on cellcept and low dose prednisone and dapsone. Because of progression in hypoxemia and lung function testing in 2016 we changed her therapy to anti-fibrotic therapy with Esbriet. Because of a strong concern for an underlying connective tissue disease it was decided that she will be maintained on low-dose CellCept.   HPI Chief Complaint  Patient presents with  . Follow-up    pt states she is doing well other than a dry cough that comes and goes.    Kaitlin Hamilton says she is doing OK.  She hasn't been sick lately.  She says that she hasn't been sick lately.  She feels like her breathing has not really gotten any worse.  She says taht her anxiety has bothered her some and making her lose some sleep.  She continues to use and benefit from CPAP nightly.  Her friend is trying to get her to stay at her house.  She has a hacking cough.  She doesn't drink too much caffeine  or tea.   She is still taking her cell cept and esbriet regularly.    Past Medical History:  Diagnosis Date  . Allergic rhinitis    uses Flonase daily  . Anesthesia complication    Per pt/ past endoscopy in 2015, the anesthetic spray in back throat caused her to have breathing problems  . Anxiety   . Bronchitis   . Chicken pox   . Constipation    takes Fiber daily  . Depression   . Diverticulosis   . Dysphagia   . GERD (gastroesophageal reflux disease)    takes Protonix daily  . H/O hiatal hernia   . Hemorrhoids   . History of bronchitis    2013  . History of colon polyps   . History of kidney stones   . Hyperlipidemia    lost 43 pounds and no meds required at present  . Hypertension    takes Verapamil and HCTZ daily  . Hypokalemia   . Hypothyroidism (acquired)    takes Synthroid daily  . Insomnia    doesn't take any meds for this  . Joint swelling    left thumb  . Migraines    last one about a month ago  . Non-insulin dependent type 2 diabetes mellitus ([place])    takes Glipizide daily  . OA (osteoarthritis)    left knee  . Oxygen deficiency   . Oxygen dependent    Pt is on continuous O2 at 3 liters!  Marland Kitchen  Pneumonia    last time in 2006  . PONV (postoperative nausea and vomiting)   . Shortness of breath    with exertion;takes Singulair daily as well as Flonase  . Urinary frequency         Review of Systems  Constitutional: Negative for activity change, chills, fatigue and fever.  HENT: Negative for congestion, ear pain, nosebleeds, postnasal drip, rhinorrhea and sinus pressure.   Respiratory: Positive for shortness of breath. Negative for cough and wheezing.   Cardiovascular: Negative for chest pain, palpitations and leg swelling.  Gastrointestinal: Negative for abdominal pain and nausea.       Objective:   Physical Exam  Vitals:   04/02/17 1347  BP: 134/78  Pulse: 82  SpO2: 94%  Weight: 267 lb (121.1 kg)  Height: 5' 6" (1.676 m)  4L [place]   Gen:  well appearing HENT: OP clear, TM's clear, neck supple PULM: Crackles bases B, normal percussion CV: RRR, no mgr, trace edema GI: BS+, soft, nontender Derm: no cyanosis or rash Psyche: normal mood and affect   February 2014 chest x-ray at [facility] normal   11/2012 CT chest >> ( read) centrilobular nodules, interlobular septal thickening worse in bases and periphery R lung > L; some GGO in bases and periphery as well, some bronchiectasis in the bases R > L; findings suggestive of fibrosis but not UIP; question hypersensitivity pneumonitis given centrilobular nodules; also question aspiration  03/2013 ANA, ANCA, Anti-Jo-1, ESR, RF, SCL-70, anti-centromere, SSA/SSB, all negative; CRP 0.6;  04/2013 Barium swallow> abnormal esophageal motility, GERD, hiatal hernia  04/2013 CT chest ([name])> findings suggestive of but not diagnostic of NSIP, small pulmonary nodule  04/2013 HP panel >> 05/17/2013 3 month prednisone trial 06/2013 open lung biopsy> UIP    February 2016 CT chest high resolution> slight progression in reticular abnormalities consistent with pulmonary fibrosis, uip  6 minute walk 10/19/2012 walked 500 feet in office on room air oxygenation did not drop below 90% 04/2013 6MW RA > 1100 feet, HR peak 109, O2 sat Nadir 87% 11/2013 6MW 1364 feet, O2 saturation nadir 78% 08/21/14 6MW 1400 feet 90% on 8L 11/2016 6MW 248m needed 6LNC  PFT February 2014 simple spirometry performed by her primary care physician>> ratio 90%, FEV1 1.71 L (64% predicted, FVC 1.83 L 55% predicted; flow volume loop is not consistent with obstruction 11/15/2012 Full PFT [name]> Ratio 87%, FEV1 2.00L > 2.07 with bronchodilator (3% change); TLC 3.44 L (65% pred), ERV 0.62 (57% pred), DLCO 15.1 ml/mmHg/min (59% pred) 04/2013 Full PFT> Ratio 93%, FEV1 1.92 L (71% pred), TLC 3.02L (56% pred), DLCO 15.94 (59% pred) May 2015 full pulmonary function test ratio 93%, FEV1 1.71 L (64% predicted, no change with  bronchodilator), total lung capacity 2.67 L (49% predicted), DLCO 13.11 (40% predicted) 03/2015 PFT > Ratio 93%, FEV1 1.24L, FVC 1.33L (38% pred), TLC 2.56L (47% pred), DLCO 12.10 (44% pred) March 2017 pulmonary function testing ratio 93%, FVC (41% predicted), total lung capacity 2.34 L (43% protected), DLCO 10.53 (39% protected). November 2017 pulmonary function testing ratio 92%, FVC 1.26 L, 37% predicted, total lung capacity 2.37 L 44% predicted, DLCO 5. 02/27/2020 percent predicted  My review of her PCP notes show where she was cared for for routine health maintenance issues and had LFT's drawn which were normal      Assessment & Plan:  UIP (usual interstitial pneumonitis) ([place] - Plan: Pulmonary function test  Therapeutic drug monitoring - Plan: Hepatic function panel  OSA (obstructive  sleep apnea)  Discussion: This is been a stable interval for The. She has severe chronic respiratory failure with hypoxemia secondary to usual interstitial pneumonia with autoimmune features. She has been stable with CellCept and Esbriet. Her most recent 6 minute walk test was reasonable. She needs 4 L of oxygen at rest, 6 L on exertion.-Advised her to get a flu shot as soon as possible. She needs lab work today to monitor for drug toxicity from the [place]. I see no reason to change her medications today. We will get surveillance testing on her when she comes back in 3 months.   Plan: For your obstructive sleep apnea: Keep using CPAP nightly  For your usual interstitial pneumonia: Keep taking CellCept low-dose Keep taking Esbriet as you are doing We will get a lung function test on the next visit LFT today  For your chronic respiratory failure with hypoxemia: Keep using 4 L of oxygen at rest, 6 L with exertion  We will see you back in 3 months or sooner if needed  Get a flu shot as soon as possible     Current Outpatient Prescriptions:  .  acetaminophen (TYLENOL) 500 MG tablet, Take 1,000  mg by mouth every 6 (six) hours as needed for moderate pain., Disp: , Rfl:  .  diclofenac (VOLTAREN) 75 MG EC tablet, Take 1 tablet by mouth two  times daily, Disp: 180 tablet, Rfl: 0 .  diphenhydrAMINE (BENADRYL) 25 mg capsule, Take 25 mg by mouth as needed., Disp: , Rfl:  .  ESBRIET 801 MG TABS, TAKE 1 TABLET BY MOUTH THREE TIMES DAILY, Disp: 90 tablet, Rfl: 5 .  FIBER PO, Take 1 tablet by mouth daily., Disp: , Rfl:  .  fluticasone (FLONASE) 50 MCG/ACT nasal spray, Place 1 spray into both nostrils daily., Disp: , Rfl:  .  levothyroxine (SYNTHROID, LEVOTHROID) 125 MCG tablet, TAKE 1 TABLET BY MOUTH  DAILY BEFORE BREAKFAST, Disp: 90 tablet, Rfl: 3 .  losartan-hydrochlorothiazide (HYZAAR) 50-12.5 MG tablet, Take 1 tablet by mouth daily., Disp: 90 tablet, Rfl: 1 .  metFORMIN (GLUCOPHAGE-XR) 500 MG 24 hr tablet, Take 1 tablet (500 mg total) by mouth daily with breakfast., Disp: 30 tablet, Rfl: 11 .  montelukast (SINGULAIR) 10 MG tablet, Take 1 tablet (10 mg total) by mouth at bedtime., Disp: 90 tablet, Rfl: 3 .  mycophenolate (CELLCEPT) 500 MG tablet, Take 1 tablet (500 mg total) by mouth daily., Disp: 90 tablet, Rfl: 3 .  NON FORMULARY, Place 3 L into the nose daily. 4-5 Liters with exertion, Disp: , Rfl:  .  nystatin cream (MYCOSTATIN), Apply 1 application topically 3 (three) times daily., Disp: 30 g, Rfl: 2 .  ONE TOUCH ULTRA TEST test strip, , Disp: , Rfl:  .  ONGLYZA 5 MG TABS tablet, TAKE 1 TABLET BY MOUTH  DAILY, Disp: 90 tablet, Rfl: 1 .  pantoprazole (PROTONIX) 40 MG tablet, TAKE 1 TABLET BY MOUTH TWO  TIMES DAILY, Disp: 180 tablet, Rfl: 1 .  pioglitazone (ACTOS) 45 MG tablet, Take 1 tablet (45 mg total) by mouth daily., Disp: 90 tablet, Rfl: 1 .  pravastatin (PRAVACHOL) 40 MG tablet, Take 1 tablet (40 mg total) by mouth daily., Disp: 90 tablet, Rfl: 3 .  verapamil (CALAN-SR) 180 MG CR tablet, TAKE 1 TABLET BY MOUTH  DAILY, Disp: 90 tablet, Rfl: 1

## 2017-04-04 DIAGNOSIS — J8489 Other specified interstitial pulmonary diseases: Secondary | ICD-10-CM | POA: Diagnosis not present

## 2017-04-07 ENCOUNTER — Other Ambulatory Visit: Payer: Self-pay | Admitting: Family Medicine

## 2017-04-20 ENCOUNTER — Other Ambulatory Visit: Payer: Self-pay | Admitting: Family Medicine

## 2017-04-20 DIAGNOSIS — Z1231 Encounter for screening mammogram for malignant neoplasm of breast: Secondary | ICD-10-CM

## 2017-04-28 ENCOUNTER — Ambulatory Visit
Admission: RE | Admit: 2017-04-28 | Discharge: 2017-04-28 | Disposition: A | Discharge status: Home / Self Care | Payer: Medicare Other | Source: Ambulatory Visit | Attending: Family Medicine | Admitting: Family Medicine

## 2017-04-28 DIAGNOSIS — Z1231 Encounter for screening mammogram for malignant neoplasm of breast: Secondary | ICD-10-CM | POA: Insufficient documentation

## 2017-05-01 ENCOUNTER — Telehealth: Payer: Self-pay | Admitting: Family Medicine

## 2017-05-01 DIAGNOSIS — E7800 Pure hypercholesterolemia, unspecified: Secondary | ICD-10-CM

## 2017-05-01 DIAGNOSIS — E1165 Type 2 diabetes mellitus with hyperglycemia: Secondary | ICD-10-CM

## 2017-05-01 DIAGNOSIS — E039 Hypothyroidism, unspecified: Secondary | ICD-10-CM

## 2017-05-01 NOTE — Telephone Encounter (Signed)
-----   Message from Ellamae Sia sent at 04/29/2017  9:51 AM EDT ----- Regarding: Lab orders for Monday,10.15.18 Lab orders for DM

## 2017-05-04 ENCOUNTER — Other Ambulatory Visit (INDEPENDENT_AMBULATORY_CARE_PROVIDER_SITE_OTHER): Payer: Medicare Other

## 2017-05-04 DIAGNOSIS — E1165 Type 2 diabetes mellitus with hyperglycemia: Secondary | ICD-10-CM

## 2017-05-04 DIAGNOSIS — J8489 Other specified interstitial pulmonary diseases: Secondary | ICD-10-CM | POA: Diagnosis not present

## 2017-05-04 LAB — COMPREHENSIVE METABOLIC PANEL
ALBUMIN: 4.4 g/dL (ref 3.5–5.2)
ALT: 15 U/L (ref 0–35)
AST: 15 U/L (ref 0–37)
Alkaline Phosphatase: 71 U/L (ref 39–117)
BILIRUBIN TOTAL: 0.5 mg/dL (ref 0.2–1.2)
BUN: 22 mg/dL (ref 6–23)
CALCIUM: 10.2 mg/dL (ref 8.4–10.5)
CHLORIDE: 96 meq/L (ref 96–112)
CO2: 32 meq/L (ref 19–32)
CREATININE: 0.74 mg/dL (ref 0.40–1.20)
GFR: 83.34 mL/min (ref >60.00)
Glucose, Bld: 190 mg/dL — ABNORMAL HIGH (ref 70–99)
Potassium: 3.8 mEq/L (ref 3.5–5.1)
Sodium: 138 mEq/L (ref 135–145)
Total Protein: 7.6 g/dL (ref 6.0–8.3)

## 2017-05-04 LAB — HEMOGLOBIN A1C: Hgb A1c MFr Bld: 8 % — ABNORMAL HIGH (ref 4.6–6.5)

## 2017-05-07 ENCOUNTER — Encounter: Payer: Self-pay | Admitting: Family Medicine

## 2017-05-07 ENCOUNTER — Ambulatory Visit (INDEPENDENT_AMBULATORY_CARE_PROVIDER_SITE_OTHER): Payer: Medicare Other | Admitting: Family Medicine

## 2017-05-07 VITALS — BP 118/60 | HR 72 | Temp 97.6°F | Ht 66.0 in | Wt 265.8 lb

## 2017-05-07 DIAGNOSIS — I10 Essential (primary) hypertension: Secondary | ICD-10-CM | POA: Diagnosis not present

## 2017-05-07 DIAGNOSIS — E1165 Type 2 diabetes mellitus with hyperglycemia: Secondary | ICD-10-CM | POA: Diagnosis not present

## 2017-05-07 LAB — HM DIABETES FOOT EXAM

## 2017-05-07 MED ORDER — METFORMIN HCL ER 500 MG PO TB24
1000.0000 mg | ORAL_TABLET | Freq: Every day | ORAL | 11 refills | Status: DC
Start: 2017-05-07 — End: 2018-05-13

## 2017-05-07 NOTE — Patient Instructions (Signed)
Keep up with healthy eating but don't be too  Restrictive for too long.  Increase metformin to 1000 mg daily.  Walk as much as able.

## 2017-05-07 NOTE — Progress Notes (Signed)
Subjective:    Patient ID: Kaitlin Hamilton, female    DOB: [birth year], 66 y.o.   MRN: [id number]  HPI   66 year old female patient presents for DM follow up.  Diabetes:  inadequate control on actos, metformin,  And onglyza  She is tolerating metformin without SE. Lab Results  Component Value Date   HGBA1C 8.0 (H) 05/04/2017  Using medications without difficulties: Hypoglycemic episodes: none Hyperglycemic episodes: YES Feet problems: none Blood Sugars averaging: FBS -527-782 eye exam within last year: yes.   She has been working on low carb diet in last 3 weeks.  Has lost 6 lbs in last 3 months. Wt Readings from Last 3 Encounters:  05/07/17 265 lb 12 oz (120.5 kg)  04/02/17 267 lb (121.1 kg)  01/30/17 271 lb (122.9 kg)    Hypertension:   Good control on current regimen.   BP Readings from Last 3 Encounters:  05/07/17 118/60  04/02/17 134/78  01/30/17 100/62  Using medication without problems or lightheadedness:  none Chest pain with exertion: none Edema: none Short of breath: none Average home BPs: Other issues:  Using nystatin for candida under breast off and on.  May need refill.  Social History /Family History/Past Medical History reviewed in detail and updated in EMR if needed. Blood pressure 118/60, pulse 72, temperature 97.6 F (36.4 C), temperature source Oral, height 5\' 6"  (1.676 m), weight 265 lb 12 oz (120.5 kg), SpO2 98 %.  Review of Systems  Constitutional: Negative for fatigue and fever.  HENT: Negative for ear pain.   Eyes: Negative for pain.  Respiratory: Negative for chest tightness and shortness of breath.   Cardiovascular: Negative for chest pain, palpitations and leg swelling.  Gastrointestinal: Negative for abdominal pain.  Genitourinary: Negative for dysuria.       Objective:   Physical Exam  Constitutional: Vital signs are normal. She appears well-developed and well-nourished. She is cooperative.  Non-toxic appearance. She does not  appear ill. No distress.  Obese on oxygen  HENT:  Head: Normocephalic.  Right Ear: Hearing, tympanic membrane, external ear and ear canal normal. Tympanic membrane is not erythematous, not retracted and not bulging.  Left Ear: Hearing, tympanic membrane, external ear and ear canal normal. Tympanic membrane is not erythematous, not retracted and not bulging.  Nose: No mucosal edema or rhinorrhea. Right sinus exhibits no maxillary sinus tenderness and no frontal sinus tenderness. Left sinus exhibits no maxillary sinus tenderness and no frontal sinus tenderness.  Mouth/Throat: Uvula is midline, oropharynx is clear and moist and mucous membranes are normal.  Eyes: Pupils are equal, round, and reactive to light. Conjunctivae, EOM and lids are normal. Lids are everted and swept, no foreign bodies found.  Neck: Trachea normal and normal range of motion. Neck supple. Carotid bruit is not present. No thyroid mass and no thyromegaly present.  Cardiovascular: Normal rate, regular rhythm, S1 normal, S2 normal, normal heart sounds, intact distal pulses and normal pulses.  Exam reveals no gallop and no friction rub.   No murmur heard. Pulmonary/Chest: Effort normal and breath sounds normal. No tachypnea. No respiratory distress. She has no decreased breath sounds. She has no wheezes. She has no rhonchi. She has no rales.  Abdominal: Soft. Normal appearance and bowel sounds are normal. There is no tenderness.  Neurological: She is alert.  Skin: Skin is warm, dry and intact. No rash noted.  Psychiatric: Her speech is normal and behavior is normal. Judgment and thought content normal. Her mood appears  not anxious. Cognition and memory are normal. She does not exhibit a depressed mood.    Diabetic foot exam: Normal inspection No skin breakdown No calluses  Normal DP pulses Normal sensation to light touch and monofilament Nails normal      Assessment & Plan:

## 2017-05-26 ENCOUNTER — Other Ambulatory Visit: Payer: Self-pay | Admitting: Family Medicine

## 2017-05-28 ENCOUNTER — Telehealth: Payer: Self-pay | Admitting: Pulmonary Disease

## 2017-05-28 NOTE — Telephone Encounter (Signed)
Will hold in my box to look out for.

## 2017-05-28 NOTE — Telephone Encounter (Signed)
noted 

## 2017-05-28 NOTE — Telephone Encounter (Signed)
Forms have been received and I advised pt that I would place in BQ folder.

## 2017-05-29 ENCOUNTER — Ambulatory Visit: Payer: Medicare Other

## 2017-06-02 ENCOUNTER — Telehealth: Payer: Self-pay | Admitting: Family Medicine

## 2017-06-02 ENCOUNTER — Ambulatory Visit (INDEPENDENT_AMBULATORY_CARE_PROVIDER_SITE_OTHER): Payer: Medicare Other

## 2017-06-02 VITALS — BP 120/64 | HR 81 | Temp 97.6°F | Ht 66.0 in | Wt 258.0 lb

## 2017-06-02 DIAGNOSIS — Z0000 Encounter for general adult medical examination without abnormal findings: Secondary | ICD-10-CM

## 2017-06-02 NOTE — Progress Notes (Signed)
Subjective:   Kaitlin Hamilton is a 66 y.o. female who presents for Medicare Annual (Subsequent) preventive examination.  Review of Systems:  N/A Cardiac Risk Factors include: advanced age (>1men, >25 women);obesity (BMI >30kg/m2);diabetes mellitus;dyslipidemia;hypertension     Objective:     Vitals: BP 120/64 (BP Location: Right Arm, Patient Position: Sitting, Cuff Size: Large)   Pulse 81   Temp 97.6 F (36.4 C) (Oral)   Ht 5\' 6"  (1.676 m) Comment: no shoes  Wt 258 lb (117 kg)   SpO2 98%   BMI 41.64 kg/m   Body mass index is 41.64 kg/m.   Tobacco Social History   Tobacco Use  Smoking Status Never Smoker  Smokeless Tobacco Never Used     Counseling given: No   Past Medical History:  Diagnosis Date  . Allergic rhinitis    uses Flonase daily  . Anesthesia complication    Per pt/ past endoscopy in 2015, the anesthetic spray in back throat caused her to have breathing problems  . Anxiety   . Bronchitis   . Chicken pox   . Constipation    takes Fiber daily  . Depression   . Diverticulosis   . Dysphagia   . GERD (gastroesophageal reflux disease)    takes Protonix daily  . H/O hiatal hernia   . Hemorrhoids   . History of bronchitis    2013  . History of colon polyps   . History of kidney stones   . Hyperlipidemia    lost 43 pounds and no meds required at present  . Hypertension    takes Verapamil and HCTZ daily  . Hypokalemia   . Hypothyroidism (acquired)    takes Synthroid daily  . Insomnia    doesn't take any meds for this  . Joint swelling    left thumb  . Migraines    last one about a month ago  . Non-insulin dependent type 2 diabetes mellitus ([place])    takes Glipizide daily  . OA (osteoarthritis)    left knee  . Oxygen deficiency   . Oxygen dependent    Pt is on continuous O2 at 3 liters!  . Pneumonia    last time in 2006  . PONV (postoperative nausea and vomiting)   . Shortness of breath    with exertion;takes Singulair daily as well as  Flonase  . Urinary frequency    Past Surgical History:  Procedure Laterality Date  . ABDOMINAL EXPLORATION SURGERY     For Ovarian Cyst   . APPENDECTOMY    . CHOLECYSTECTOMY    . ESOPHAGOGASTRODUODENOSCOPY    . left knee arthroscopy    . LITHOTRIPSY     x 2  . TCS    . TUBAL LIGATION     Family History  Problem Relation Age of Onset  . Rheum arthritis Mother   . Rheum arthritis Sister   . Prostate cancer Brother   . Heart disease Brother   . Heart disease Maternal Grandmother   . Heart disease Maternal Grandfather   . Uterine cancer Daughter   . Colon cancer Neg Hx    Social History   Substance and Sexual Activity  Sexual Activity No  . Birth control/protection: Post-menopausal    Outpatient Encounter Medications as of 06/02/2017  Medication Sig  . acetaminophen (TYLENOL) 500 MG tablet Take 1,000 mg by mouth every 6 (six) hours as needed for moderate pain.  Marland Kitchen diclofenac (VOLTAREN) 75 MG EC tablet Take 1 tablet by mouth  two  times daily  . diphenhydrAMINE (BENADRYL) 25 mg capsule Take 25 mg by mouth as needed.  . ESBRIET 801 MG TABS TAKE 1 TABLET BY MOUTH THREE TIMES DAILY  . FIBER PO Take 1 tablet by mouth daily.  . fluticasone (FLONASE) 50 MCG/ACT nasal spray Place 1 spray into both nostrils daily.  Marland Kitchen levothyroxine (SYNTHROID, LEVOTHROID) 125 MCG tablet TAKE 1 TABLET BY MOUTH  DAILY BEFORE BREAKFAST  . losartan-hydrochlorothiazide (HYZAAR) 50-12.5 MG tablet Take 1 tablet by mouth daily.  . metFORMIN (GLUCOPHAGE-XR) 500 MG 24 hr tablet Take 2 tablets (1,000 mg total) by mouth daily with breakfast.  . montelukast (SINGULAIR) 10 MG tablet Take 1 tablet (10 mg total) by mouth at bedtime.  . mycophenolate (CELLCEPT) 500 MG tablet Take 1 tablet (500 mg total) by mouth daily.  . NON FORMULARY Place 3 L into the nose daily. 4-5 Liters with exertion  . nystatin cream (MYCOSTATIN) Apply 1 application topically 3 (three) times daily.  . ONE TOUCH ULTRA TEST test strip   .  ONGLYZA 5 MG TABS tablet TAKE 1 TABLET BY MOUTH  DAILY  . pantoprazole (PROTONIX) 40 MG tablet TAKE 1 TABLET BY MOUTH TWO  TIMES DAILY  . pioglitazone (ACTOS) 45 MG tablet Take 1 tablet (45 mg total) by mouth daily.  . pravastatin (PRAVACHOL) 40 MG tablet Take 1 tablet (40 mg total) by mouth daily.  . verapamil (CALAN-SR) 180 MG CR tablet TAKE 1 TABLET BY MOUTH  DAILY   No facility-administered encounter medications on file as of 06/02/2017.     Activities of Daily Living In your present state of health, do you have any difficulty performing the following activities: 06/02/2017  Hearing? N  Vision? N  Difficulty concentrating or making decisions? N  Walking or climbing stairs? Y  Dressing or bathing? N  Doing errands, shopping? N  Preparing Food and eating ? N  Using the Toilet? N  In the past six months, have you accidently leaked urine? N  Do you have problems with loss of bowel control? N  Managing your Medications? N  Managing your Finances? N  Housekeeping or managing your Housekeeping? N  Some recent data might be hidden    Patient Care Team: [name], MD as PCP - General (Family Medicine) Juanito Doom, MD as Consulting Physician (Pulmonary Disease)    Assessment:     Hearing Screening   125Hz  250Hz  500Hz  1000Hz  2000Hz  3000Hz  4000Hz  6000Hz  8000Hz   Right ear:   40 40 40  40    Left ear:   0 0 40  0    Vision Screening Comments: Last vision exam in June 2018 @ [facility]   Exercise Activities and Dietary recommendations Current Exercise Habits: The patient does not participate in regular exercise at present, Exercise limited by: respiratory conditions(s)  Goals    Starting 06/02/2017, I will continue to drink at least 6-8 glasses of water daily.      Fall Risk Fall Risk  06/02/2017 01/30/2017 01/29/2016  Falls in the past year? No No No   Depression Screen PHQ 2/9 Scores 06/02/2017 01/30/2017 01/29/2016  PHQ - 2 Score 0 2 0  PHQ- 9 Score 0 5 -       Cognitive Function MMSE - Mini Mental State Exam 06/02/2017  Orientation to time 5  Orientation to Place 5  Registration 3  Attention/ Calculation 0  Recall 3  Language- name 2 objects 0  Language- repeat 1  Language-  follow 3 step command 3  Language- read & follow direction 0  Write a sentence 0  Copy design 0  Total score 20     PLEASE NOTE: A Mini-Cog screen was completed. Maximum score is 20. A value of 0 denotes this part of [place] MMSE was not completed or the patient failed this part of the Mini-Cog screening.   Mini-Cog Screening Orientation to Time - Max 5 pts Orientation to Place - Max 5 pts Registration - Max 3 pts Recall - Max 3 pts Language Repeat - Max 1 pts Language Follow 3 Step Command - Max 3 pts     Immunization History  Administered Date(s) Administered  . Influenza Split 05/02/2005, 11/28/2014  . Influenza Whole 04/20/2012  . Influenza,inj,Quad PF,6+ Mos 05/17/2013, 04/04/2014, 10/06/2014, 05/11/2015, 10/19/2015, 03/12/2016, 04/30/2017  . Influenza-Unspecified 04/20/2013  . Pneumococcal Conjugate-13 04/04/2014  . Pneumococcal Polysaccharide-23 04/20/2013  . Tdap 07/21/2009   Screening Tests Health Maintenance  Topic Date Due  . COLONOSCOPY  06/01/2018 (Originally 06/14/2016)  . HEMOGLOBIN A1C  11/02/2017  . OPHTHALMOLOGY EXAM  01/08/2018  . PNA vac Low Risk Adult (2 of 2 - PPSV23) 04/20/2018  . FOOT EXAM  05/07/2018  . MAMMOGRAM  04/29/2019  . TETANUS/TDAP  07/22/2019  . INFLUENZA VACCINE  Completed  . DEXA SCAN  Completed  . Hepatitis C Screening  Completed      Plan:     I have personally reviewed, addressed, and noted the following in the patient's chart:  A. Medical and social history B. Use of alcohol, tobacco or illicit drugs  C. Current medications and supplements D. Functional ability and status E.  Nutritional status F.  Physical activity G. Advance directives H. List of other physicians I.  Hospitalizations,  surgeries, and ER visits in previous 12 months J.  [place] to include hearing, vision, cognitive, depression L. Referrals and appointments - none  In addition, I have reviewed and discussed with patient certain preventive protocols, quality metrics, and best practice recommendations. A written personalized care plan for preventive services as well as general preventive health recommendations were provided to patient.  See attached scanned questionnaire for additional information.   Signed,   Lindell Noe, MHA, BS, LPN Health Coach

## 2017-06-02 NOTE — Telephone Encounter (Signed)
-----   Message from Eustace Pen, LPN sent at 58/59/2924  4:16 PM EST ----- Regarding: Labs 11/13 Lab orders needed. Thank you.  Insurance:  [facility] Medicare

## 2017-06-02 NOTE — Telephone Encounter (Signed)
Form as been received and filled out to best of my ability.  Form is in BQ's look-at folder for review and signature.

## 2017-06-02 NOTE — Progress Notes (Signed)
I reviewed health advisor's note, was available for consultation, and agree with documentation and plan.  

## 2017-06-02 NOTE — Progress Notes (Signed)
PCP notes:   Health maintenance:  Colonoscopy - per pt, pulmonologist does not recommend screening due to her health status  Abnormal screenings:   Hearing - failed  Hearing Screening   125Hz  250Hz  500Hz  1000Hz  2000Hz  3000Hz  4000Hz  6000Hz  8000Hz   Right ear:   40 40 40  40    Left ear:   0 0 40  0      Patient concerns:   None  Nurse concerns:  None  Next PCP appt:   06/05/17 @ 1115

## 2017-06-02 NOTE — Patient Instructions (Signed)
Kaitlin Hamilton , Thank you for taking time to come for your Medicare Wellness Visit. I appreciate your ongoing commitment to your health goals. Please review the following plan we discussed and let me know if I can assist you in the future.   These are the goals we discussed: Goals    Starting 06/02/2017, I will continue to drink at least 6-8 glasses of water daily.       This is a list of the screening recommended for you and due dates:  Health Maintenance  Topic Date Due  . Colon Cancer Screening  06/01/2018*  . Hemoglobin A1C  11/02/2017  . Eye exam for diabetics  01/08/2018  . Pneumonia vaccines (2 of 2 - PPSV23) 04/20/2018  . Complete foot exam   05/07/2018  . Mammogram  04/29/2019  . Tetanus Vaccine  07/22/2019  . Flu Shot  Completed  . DEXA scan (bone density measurement)  Completed  .  Hepatitis C: One time screening is recommended by Center for Disease Control  (CDC) for  adults born from [birth year] through 1965.   Completed  *Topic was postponed. The date shown is not the original due date.   Preventive Care for Adults  A healthy lifestyle and preventive care can promote health and wellness. Preventive health guidelines for adults include the following key practices.  . A routine yearly physical is a good way to check with your health care provider about your health and preventive screening. It is a chance to share any concerns and updates on your health and to receive a thorough exam.  . Visit your dentist for a routine exam and preventive care every 6 months. Brush your teeth twice a day and floss once a day. Good oral hygiene prevents tooth decay and gum disease.  . The frequency of eye exams is based on your age, health, family medical history, use  of contact lenses, and other factors. Follow your health care provider's recommendations for frequency of eye exams.  . Eat a healthy diet. Foods like vegetables, fruits, whole grains, low-fat dairy products, and lean protein foods  contain the nutrients you need without too many calories. Decrease your intake of foods high in solid fats, added sugars, and salt. Eat the right amount of calories for you. Get information about a proper diet from your health care provider, if necessary.  . Regular physical exercise is one of the most important things you can do for your health. Most adults should get at least 150 minutes of moderate-intensity exercise (any activity that increases your heart rate and causes you to sweat) each week. In addition, most adults need muscle-strengthening exercises on 2 or more days a week.  Silver Sneakers may be a benefit available to you. To determine eligibility, you may visit the website: www.silversneakers.com or contact program at 312-679-1134 Mon-Fri between 8AM-8PM.   . Maintain a healthy weight. The body mass index (BMI) is a screening tool to identify possible weight problems. It provides an estimate of body fat based on height and weight. Your health care provider can find your BMI and can help you achieve or maintain a healthy weight.   For adults 20 years and older: ? A BMI below 18.5 is considered underweight. ? A BMI of 18.5 to 24.9 is normal. ? A BMI of 25 to 29.9 is considered overweight. ? A BMI of 30 and above is considered obese.   . Maintain normal blood lipids and cholesterol levels by exercising and minimizing your intake  of saturated fat. Eat a balanced diet with plenty of fruit and vegetables. Blood tests for lipids and cholesterol should begin at age 62 and be repeated every 5 years. If your lipid or cholesterol levels are high, you are over 50, or you are at high risk for heart disease, you may need your cholesterol levels checked more frequently. Ongoing high lipid and cholesterol levels should be treated with medicines if diet and exercise are not working.  . If you smoke, find out from your health care provider how to quit. If you do not use tobacco, please do not  start.  . If you choose to drink alcohol, please do not consume more than 2 drinks per day. One drink is considered to be 12 ounces (355 mL) of beer, 5 ounces (148 mL) of wine, or 1.5 ounces (44 mL) of liquor.  . If you are 52-35 years old, ask your health care provider if you should take aspirin to prevent strokes.  . Use sunscreen. Apply sunscreen liberally and repeatedly throughout the day. You should seek shade when your shadow is shorter than you. Protect yourself by wearing long sleeves, pants, a wide-brimmed hat, and sunglasses year round, whenever you are outdoors.  . Once a month, do a whole body skin exam, using a mirror to look at the skin on your back. Tell your health care provider of new moles, moles that have irregular borders, moles that are larger than a pencil eraser, or moles that have changed in shape or color.

## 2017-06-02 NOTE — Progress Notes (Signed)
Pre visit review using our clinic review tool, if applicable. No additional management support is needed unless otherwise documented below in the visit note. 

## 2017-06-04 DIAGNOSIS — J8489 Other specified interstitial pulmonary diseases: Secondary | ICD-10-CM | POA: Diagnosis not present

## 2017-06-05 ENCOUNTER — Encounter: Payer: Self-pay | Admitting: Family Medicine

## 2017-06-05 ENCOUNTER — Ambulatory Visit (INDEPENDENT_AMBULATORY_CARE_PROVIDER_SITE_OTHER): Payer: Medicare Other | Admitting: Family Medicine

## 2017-06-05 ENCOUNTER — Other Ambulatory Visit: Payer: Self-pay

## 2017-06-05 VITALS — BP 120/80 | HR 80 | Temp 97.8°F | Ht 66.0 in | Wt 256.8 lb

## 2017-06-05 DIAGNOSIS — Z0000 Encounter for general adult medical examination without abnormal findings: Secondary | ICD-10-CM

## 2017-06-05 DIAGNOSIS — E1165 Type 2 diabetes mellitus with hyperglycemia: Secondary | ICD-10-CM | POA: Diagnosis not present

## 2017-06-05 NOTE — Patient Instructions (Signed)
Continue current medications including the higher dose of metformin.  Continue working on weight loss and low sugar.

## 2017-06-05 NOTE — Assessment & Plan Note (Signed)
Improving gradually with decreasing fasting CBGContinue current medications including the higher dose of metformin.  Continue working on weight loss and low sugar.  Follow up with labs in 3 months.

## 2017-06-05 NOTE — Telephone Encounter (Signed)
Received forms back from [place]. Forms were faxed to [place] today.   Left message for patient to inform her.   Will send forms to scan today after receiving a confirmation that fax was successful.

## 2017-06-05 NOTE — Progress Notes (Signed)
Subjective:    Patient ID: Kaitlin Hamilton, female    DOB: May 22, 1951, 66 y.o.   MRN: [id number]  HPI  The patient presents for complete physical and review of chronic health problems.   The patient saw Candis Musa, LPN for medicare wellness. Note reviewed in detail and important notes copied below. Health maintenance: Colonoscopy - per pt, pulmonologist does not recommend screening due to her health status Abnormal screenings:  Hearing - failed  06/05/17 Today   Chronic lung issues followed closely by Pulm.  Diabetes:   At last OV in 10/15 increased metformin to 2000 mg daily., masx actos and on onglyza  She has had improvement in  CBGs now 154-155  [facility] Lab Results  Component Value Date   HGBA1C 8.0 (H) 05/04/2017  Using medications without difficulties: Hypoglycemic episodes: none Hyperglycemic episodes: still coc Feet problems: none Blood Sugars averaging: reviewed eye exam within last year: 12/2016    Wt Readings from Last 3 Encounters:  06/05/17 256 lb 12 oz (116.5 kg)  06/02/17 258 lb (117 kg)  05/07/17 265 lb 12 oz (120.5 kg)    Social History /Family History/Past Medical History reviewed in detail and updated in EMR if needed. Blood pressure 120/80, pulse 80, temperature 97.8 F (36.6 C), temperature source Oral, height 5\' 6"  (1.676 m), weight 256 lb 12 oz (116.5 kg), SpO2 96 %.   Review of Systems  Constitutional: Positive for fatigue. Negative for fever.  HENT: Negative for congestion.   Eyes: Negative for pain.  Respiratory: Positive for shortness of breath. Negative for cough.   Cardiovascular: Negative for chest pain, palpitations and leg swelling.  Gastrointestinal: Negative for abdominal pain.  Genitourinary: Negative for dysuria and vaginal bleeding.  Musculoskeletal: Negative for back pain.  Neurological: Negative for syncope, light-headedness and headaches.  Psychiatric/Behavioral: Negative for dysphoric mood.       Objective:   Physical Exam  Constitutional: Vital signs are normal. She appears well-developed and well-nourished. She is cooperative.  Non-toxic appearance. She does not appear ill. No distress.  obese  HENT:  Head: Normocephalic.  Right Ear: Hearing, tympanic membrane, external ear and ear canal normal. Tympanic membrane is not erythematous, not retracted and not bulging.  Left Ear: Hearing, tympanic membrane, external ear and ear canal normal. Tympanic membrane is not erythematous, not retracted and not bulging.  Nose: No mucosal edema or rhinorrhea. Right sinus exhibits no maxillary sinus tenderness and no frontal sinus tenderness. Left sinus exhibits no maxillary sinus tenderness and no frontal sinus tenderness.  Mouth/Throat: Uvula is midline, oropharynx is clear and moist and mucous membranes are normal.  Eyes: Conjunctivae, EOM and lids are normal. Pupils are equal, round, and reactive to light. Lids are everted and swept, no foreign bodies found.  Neck: Trachea normal and normal range of motion. Neck supple. Carotid bruit is not present. No thyroid mass and no thyromegaly present.  Cardiovascular: Normal rate, regular rhythm, S1 normal, S2 normal, normal heart sounds, intact distal pulses and normal pulses. Exam reveals no gallop and no friction rub.  No murmur heard. Pulmonary/Chest: Effort normal. No tachypnea. No respiratory distress. She has decreased breath sounds. She has no wheezes. She has no rhonchi. She has rales in the right lower field and the left lower field.  Abdominal: Soft. Normal appearance and bowel sounds are normal. There is no tenderness.  Neurological: She is alert.  Skin: Skin is warm, dry and intact. No rash noted.  Psychiatric: Her speech is normal and behavior is  normal. Judgment and thought content normal. Her mood appears not anxious. Cognition and memory are normal. She does not exhibit a depressed mood.          Assessment & Plan:  The patient's preventative  maintenance and recommended screening tests for an annual wellness exam were reviewed in full today. Brought up to date unless services declined.  Counselled on the importance of diet, exercise, and its role in overall health and mortality. The patient's FH and SH was reviewed, including their home life, tobacco status, and drug and alcohol status.   Vaccines: uptodate  Colon:  2014, Dr. Hilarie Fredrickson, sessile polyps, recommended repeat in 3 year. Pt refused.  PAP/DVE: Pap not indicated.  Daughter with ovarian cancer.  Nonsmoker HIV: refused  mammo: nml 04/2017

## 2017-06-16 ENCOUNTER — Other Ambulatory Visit: Payer: Self-pay | Admitting: Pulmonary Disease

## 2017-06-22 ENCOUNTER — Encounter: Payer: Self-pay | Admitting: Family Medicine

## 2017-06-22 ENCOUNTER — Ambulatory Visit: Payer: Medicare Other | Admitting: Family Medicine

## 2017-06-22 VITALS — BP 126/80 | HR 80 | Temp 97.9°F | Wt 251.8 lb

## 2017-06-22 DIAGNOSIS — R300 Dysuria: Secondary | ICD-10-CM

## 2017-06-22 DIAGNOSIS — N3000 Acute cystitis without hematuria: Secondary | ICD-10-CM

## 2017-06-22 DIAGNOSIS — N390 Urinary tract infection, site not specified: Secondary | ICD-10-CM | POA: Insufficient documentation

## 2017-06-22 DIAGNOSIS — B9689 Other specified bacterial agents as the cause of diseases classified elsewhere: Secondary | ICD-10-CM | POA: Insufficient documentation

## 2017-06-22 LAB — POC URINALSYSI DIPSTICK (AUTOMATED)
BILIRUBIN UA: NEGATIVE
Glucose, UA: NEGATIVE
KETONES UA: NEGATIVE
NITRITE UA: POSITIVE
PH UA: 6 (ref 5.0–8.0)
SPEC GRAV UA: 1.025 (ref 1.010–1.025)
Urobilinogen, UA: 0.2 E.U./dL

## 2017-06-22 MED ORDER — CEPHALEXIN 500 MG PO CAPS: 500.0000 mg | ORAL_CAPSULE | Freq: Two times a day (BID) | ORAL | 0 refills | Status: DC

## 2017-06-22 NOTE — Assessment & Plan Note (Signed)
Story and UA/micro consistent with simple UTI. Treat with 7d keflex course. UCx not sent.  Update if not improving with treatment.  Pt agrees with plan.

## 2017-06-22 NOTE — Progress Notes (Signed)
BP 126/80 (BP Location: Left Arm, Patient Position: Sitting, Cuff Size: Large)   Pulse 80   Temp 97.9 F (36.6 C) (Oral)   Wt 251 lb 12 oz (114.2 kg)   SpO2 97% Comment: 4 L  BMI 40.63 kg/m    CC: UTI? Subjective:    Patient ID: Kaitlin Hamilton, female    DOB: Oct 04, 1950, 66 y.o.   MRN: [id number]  HPI: Kaitlin Hamilton is a 66 y.o. female presenting on 06/22/2017 for Dysuria (Started 06/18/17. Has improved); Urinary Frequency; and Cough (Wants lungs checked)   Pt of Dr Rometta Emery new to me presents with 5d h/o dysuria as well as L flank pain, temperature of 100. Burning has improved some. Flank pain has improved. + urgency, frequency. No hematuria, abd pain, fevers/chills, vomiting. Some nausea.   She stays well hydrated.  H/o kidney stone 2017, no recent UTI.   UIP on chronic supplemental O2 4-6 L [place] and on cellcept. Noticing hacking cough that is worse than usual. Has upcoming pulm apt 07/10/2017. No fevers. No increased productive cough, wheezing or dyspnea.   Relevant past medical, surgical, family and social history reviewed and updated as indicated. Interim medical history since our last visit reviewed. Allergies and medications reviewed and updated. Outpatient Medications Prior to Visit  Medication Sig Dispense Refill  . acetaminophen (TYLENOL) 500 MG tablet Take 1,000 mg by mouth every 6 (six) hours as needed for moderate pain.    Marland Kitchen diclofenac (VOLTAREN) 75 MG EC tablet Take 1 tablet by mouth two  times daily 180 tablet 0  . diphenhydrAMINE (BENADRYL) 25 mg capsule Take 25 mg by mouth as needed.    . ESBRIET 801 MG TABS TAKE 1 TABLET BY MOUTH THREE TIMES DAILY 90 tablet 5  . FIBER PO Take 1 tablet by mouth daily.    . fluticasone (FLONASE) 50 MCG/ACT nasal spray Place 1 spray into both nostrils daily.    Marland Kitchen levothyroxine (SYNTHROID, LEVOTHROID) 125 MCG tablet TAKE 1 TABLET BY MOUTH  DAILY BEFORE BREAKFAST 90 tablet 3  . losartan-hydrochlorothiazide (HYZAAR) 50-12.5 MG tablet Take  1 tablet by mouth daily. 90 tablet 1  . metFORMIN (GLUCOPHAGE-XR) 500 MG 24 hr tablet Take 2 tablets (1,000 mg total) by mouth daily with breakfast. 60 tablet 11  . montelukast (SINGULAIR) 10 MG tablet Take 1 tablet (10 mg total) by mouth at bedtime. 90 tablet 3  . mycophenolate (CELLCEPT) 500 MG tablet Take 1 tablet (500 mg total) by mouth daily. 90 tablet 3  . mycophenolate (CELLCEPT) 500 MG tablet TAKE 1 TABLET BY MOUTH DAILY 30 tablet 5  . NON FORMULARY Place 3 L into the nose daily. 4-5 Liters with exertion    . nystatin cream (MYCOSTATIN) Apply 1 application topically 3 (three) times daily. 30 g 2  . ONE TOUCH ULTRA TEST test strip     . ONGLYZA 5 MG TABS tablet TAKE 1 TABLET BY MOUTH  DAILY 90 tablet 1  . pantoprazole (PROTONIX) 40 MG tablet TAKE 1 TABLET BY MOUTH TWO  TIMES DAILY 180 tablet 1  . pioglitazone (ACTOS) 45 MG tablet Take 1 tablet (45 mg total) by mouth daily. 90 tablet 3  . pravastatin (PRAVACHOL) 40 MG tablet Take 1 tablet (40 mg total) by mouth daily. 90 tablet 3  . verapamil (CALAN-SR) 180 MG CR tablet TAKE 1 TABLET BY MOUTH  DAILY 90 tablet 1   No facility-administered medications prior to visit.      Per HPI unless specifically  indicated in ROS section below Review of Systems     Objective:    BP 126/80 (BP Location: Left Arm, Patient Position: Sitting, Cuff Size: Large)   Pulse 80   Temp 97.9 F (36.6 C) (Oral)   Wt 251 lb 12 oz (114.2 kg)   SpO2 97% Comment: 4 L  BMI 40.63 kg/m   Wt Readings from Last 3 Encounters:  06/22/17 251 lb 12 oz (114.2 kg)  06/05/17 256 lb 12 oz (116.5 kg)  06/02/17 258 lb (117 kg)    Physical Exam  Constitutional: She appears well-developed and well-nourished. No distress.  HENT:  Mouth/Throat: Oropharynx is clear and moist. No oropharyngeal exudate.  Cardiovascular: Normal rate, regular rhythm, normal heart sounds and intact distal pulses.  No murmur heard. Pulmonary/Chest: Effort normal and breath sounds normal. No  respiratory distress. She has no wheezes. She has no rales.  Bibasilar crackles  Abdominal: Soft. Bowel sounds are normal. She exhibits no distension and no mass. There is tenderness in the suprapubic area. There is no rigidity, no rebound, no guarding and negative Murphy's sign.  Psychiatric: She has a normal mood and affect.  Nursing note and vitals reviewed.  Results for orders placed or performed in visit on 06/22/17  POCT Urinalysis Dipstick (Automated)  Result Value Ref Range   Color, UA yellow    Clarity, UA cloudy    Glucose, UA negative    Bilirubin, UA negative    Ketones, UA negative    Spec Grav, UA 1.025 1.010 - 1.025   Blood, UA trace    pH, UA 6.0 5.0 - 8.0   Protein, UA 1+    Urobilinogen, UA 0.2 0.2 or 1.0 E.U./dL   Nitrite, UA positive    Leukocytes, UA Moderate (2+) (A) Negative      Assessment & Plan:   Problem List Items Addressed This Visit    UTI (urinary tract infection) - Primary    Story and UA/micro consistent with simple UTI. Treat with 7d keflex course. UCx not sent.  Update if not improving with treatment.  Pt agrees with plan.       Relevant Medications   cephALEXin (KEFLEX) 500 MG capsule    Other Visit Diagnoses    Dysuria       Relevant Orders   POCT Urinalysis Dipstick (Automated) (Completed)       Follow up plan: Return if symptoms worsen or fail to improve.  Ria Bush, MD

## 2017-06-22 NOTE — Patient Instructions (Signed)
I do think you have bladder infection. Treat with keflex 1 week course.  Push fluids and rest Let us know if not improving with treatment.   Urinary Tract Infection, Adult A urinary tract infection (UTI) is an infection of any part of the urinary tract. The urinary tract includes the:  Kidneys.  Ureters.  Bladder.  Urethra.  These organs make, store, and get rid of pee (urine) in the body. Follow these instructions at home:  Take over-the-counter and prescription medicines only as told by your doctor.  If you were prescribed an antibiotic medicine, take it as told by your doctor. Do not stop taking the antibiotic even if you start to feel better.  Avoid the following drinks: ? Alcohol. ? Caffeine. ? Tea. ? Carbonated drinks.  Drink enough fluid to keep your pee clear or pale yellow.  Keep all follow-up visits as told by your doctor. This is important.  Make sure to: ? Empty your bladder often and completely. Do not to hold pee for long periods of time. ? Empty your bladder before and after sex. ? Wipe from front to back after a bowel movement if you are female. Use each tissue one time when you wipe. Contact a doctor if:  You have back pain.  You have a fever.  You feel sick to your stomach (nauseous).  You throw up (vomit).  Your symptoms do not get better after 3 days.  Your symptoms go away and then come back. Get help right away if:  You have very bad back pain.  You have very bad lower belly (abdominal) pain.  You are throwing up and cannot keep down any medicines or water. This information is not intended to replace advice given to you by your health care provider. Make sure you discuss any questions you have with your health care provider. Document Released: 12/24/2007 Document Revised: 12/13/2015 Document Reviewed: 05/28/2015 Elsevier Interactive Patient Education  [employer].

## 2017-07-04 DIAGNOSIS — J8489 Other specified interstitial pulmonary diseases: Secondary | ICD-10-CM | POA: Diagnosis not present

## 2017-07-06 ENCOUNTER — Other Ambulatory Visit: Payer: Self-pay | Admitting: Family Medicine

## 2017-07-06 NOTE — Telephone Encounter (Signed)
Electronic refill Last refill 01/30/17 - 30 G/2 refills Last office visit 06/22/17/acute

## 2017-07-10 ENCOUNTER — Ambulatory Visit (INDEPENDENT_AMBULATORY_CARE_PROVIDER_SITE_OTHER): Payer: Medicare Other | Admitting: *Deleted

## 2017-07-10 ENCOUNTER — Encounter: Payer: Self-pay | Admitting: Pulmonary Disease

## 2017-07-10 ENCOUNTER — Ambulatory Visit (INDEPENDENT_AMBULATORY_CARE_PROVIDER_SITE_OTHER): Payer: Medicare Other | Admitting: Pulmonary Disease

## 2017-07-10 ENCOUNTER — Ambulatory Visit: Payer: Medicare Other | Admitting: Pulmonary Disease

## 2017-07-10 VITALS — BP 122/78 | HR 87 | Ht 66.0 in | Wt 255.0 lb

## 2017-07-10 DIAGNOSIS — J84112 Idiopathic pulmonary fibrosis: Secondary | ICD-10-CM | POA: Diagnosis not present

## 2017-07-10 DIAGNOSIS — Z5181 Encounter for therapeutic drug level monitoring: Secondary | ICD-10-CM | POA: Diagnosis not present

## 2017-07-10 DIAGNOSIS — J9611 Chronic respiratory failure with hypoxia: Secondary | ICD-10-CM

## 2017-07-10 DIAGNOSIS — J301 Allergic rhinitis due to pollen: Secondary | ICD-10-CM | POA: Diagnosis not present

## 2017-07-10 DIAGNOSIS — K219 Gastro-esophageal reflux disease without esophagitis: Secondary | ICD-10-CM

## 2017-07-10 DIAGNOSIS — G4733 Obstructive sleep apnea (adult) (pediatric): Secondary | ICD-10-CM | POA: Diagnosis not present

## 2017-07-10 LAB — PULMONARY FUNCTION TEST
DL/VA % PRED: 83 %
DL/VA: 4.2 ml/min/mmHg/L
DLCO COR: 8.89 ml/min/mmHg
DLCO UNC % PRED: 33 %
DLCO cor % pred: 32 %
DLCO unc: 9.02 ml/min/mmHg
FEF 25-75 PRE: 2.08 L/s
FEF 25-75 Post: 2.35 L/sec
FEF2575-%CHANGE-POST: 12 %
FEF2575-%PRED-POST: 108 %
FEF2575-%Pred-Pre: 96 %
FEV1-%CHANGE-POST: 2 %
FEV1-%Pred-Post: 42 %
FEV1-%Pred-Pre: 41 %
FEV1-PRE: 1.06 L
FEV1-Post: 1.08 L
FEV1FVC-%CHANGE-POST: 4 %
FEV1FVC-%PRED-PRE: 118 %
FEV6-%Change-Post: -1 %
FEV6-%PRED-POST: 35 %
FEV6-%Pred-Pre: 36 %
FEV6-PRE: 1.16 L
FEV6-Post: 1.14 L
FEV6FVC-%PRED-PRE: 104 %
FEV6FVC-%Pred-Post: 104 %
FVC-%Change-Post: -1 %
FVC-%PRED-POST: 33 %
FVC-%PRED-PRE: 34 %
FVC-PRE: 1.16 L
FVC-Post: 1.14 L
POST FEV1/FVC RATIO: 95 %
PRE FEV6/FVC RATIO: 100 %
Post FEV6/FVC ratio: 100 %
Pre FEV1/FVC ratio: 91 %
RV % PRED: 30 %
RV: 0.67 L
TLC % pred: 49 %
TLC: 2.67 L

## 2017-07-10 NOTE — Progress Notes (Signed)
Subjective:    Patient ID: Kaitlin Hamilton, female    DOB: Feb 08, 1951, 65 y.o.   MRN: [id number]  Synopsis: Kaitlin Hamilton is a very pleasant 66 year old female who first saw the [facility] pulmonary clinic in March 2014 for evaluation of shortness of breath. She had significant cough, wheezing, and sputum production requiring multiple rounds of antibiotics. Because of crackles on lung exam and an abnormal pulmonary function test she was referred to [place]. We ordered full pulmonary function testing which showed moderate restriction and a depressed DLCO in proportion to her restriction. There is no airflow obstruction and no change with bronchodilator administration. A chest x-ray performed in February 2014 was read as normal.  An open lung biopsy was performed in December 2014 showing UIP.  Because she has had multiple good responses to steroids, we have treated her with cellcept and prednisone.  In January 2015 she had severe insomnia, hallucinations and gastritis on high dose prednisone.  She had a rash to bactrim. Since February 2015 she has been on cellcept and low dose prednisone and dapsone. Because of progression in hypoxemia and lung function testing in 2016 we changed her therapy to anti-fibrotic therapy with Esbriet. Because of a strong concern for an underlying connective tissue disease it was decided that she will be maintained on low-dose CellCept.   HPI Chief Complaint  Patient presents with  . Follow-up    40morov- review PFT. pt states breathing is baseline. pt reports of non prod cough-cough worsen when eating & sob with exertion.    She is feeling pretty well right now.  She hasn't been sick this year.  She has been active, lost about 27 pounds with reducing refined carboyhydrates and drinking more fluids.  She hasn't been able to exercise regularly as much.  She can't carry her laundry because of the oxygen.  She  Can shop but trying to get her groceries in is difficult.  She tries to  get out of the house, but she does try to stay away from sick people. She goes out to eat and still goes to church activities.    She is using 4L O2 at rest and she turns her O2 up to 5-6 when active.  She sees her numbers drop as low as 89%.    OSA> still using CPAP nightly, sometimes she will skip a night but this is rare.  She feels better rested on it.  Sinus congestion> control is OK right now  Past Medical History:  Diagnosis Date  . Allergic rhinitis    uses Flonase daily  . Anesthesia complication    Per pt/ past endoscopy in 2015, the anesthetic spray in back throat caused her to have breathing problems  . Anxiety   . Bronchitis   . Chicken pox   . Constipation    takes Fiber daily  . Depression   . Diverticulosis   . Dysphagia   . GERD (gastroesophageal reflux disease)    takes Protonix daily  . H/O hiatal hernia   . Hemorrhoids   . History of bronchitis    2013  . History of colon polyps   . History of kidney stones   . Hyperlipidemia    lost 43 pounds and no meds required at present  . Hypertension    takes Verapamil and HCTZ daily  . Hypokalemia   . Hypothyroidism (acquired)    takes Synthroid daily  . Insomnia    doesn't take any meds for this  .  Joint swelling    left thumb  . Migraines    last one about a month ago  . Non-insulin dependent type 2 diabetes mellitus ([place])    takes Glipizide daily  . OA (osteoarthritis)    left knee  . Oxygen deficiency   . Oxygen dependent    Pt is on continuous O2 at 3 liters!  . Pneumonia    last time in 2006  . PONV (postoperative nausea and vomiting)   . Shortness of breath    with exertion;takes Singulair daily as well as Flonase  . Urinary frequency         Review of Systems  Constitutional: Negative for activity change, chills, fatigue and fever.  HENT: Negative for congestion, ear pain, nosebleeds, postnasal drip, rhinorrhea and sinus pressure.   Respiratory: Positive for shortness of breath.  Negative for cough and wheezing.   Cardiovascular: Negative for chest pain, palpitations and leg swelling.  Gastrointestinal: Negative for abdominal pain and nausea.       Objective:   Physical Exam  Vitals:   07/10/17 0947  BP: 122/78  Pulse: 87  SpO2: 100%  Weight: 255 lb (115.7 kg)  Height: '5\' 6"'$  (1.676 m)  4L [place]  Gen: obese but well appearing HENT: OP clear, TM's clear, neck supple PULM: Crackles 1/2 way up B, normal percussion CV: RRR, no mgr, trace edema GI: BS+, soft, nontender Derm: no cyanosis or rash Psyche: normal mood and affect   February 2014 chest x-ray at [facility] normal   11/2012 CT chest >> ([name] read) centrilobular nodules, interlobular septal thickening worse in bases and periphery R lung > L; some GGO in bases and periphery as well, some bronchiectasis in the bases R > L; findings suggestive of fibrosis but not UIP; question hypersensitivity pneumonitis given centrilobular nodules; also question aspiration  03/2013 ANA, ANCA, Anti-Jo-1, ESR, RF, SCL-70, anti-centromere, SSA/SSB, all negative; CRP 0.6;  04/2013 Barium swallow> abnormal esophageal motility, GERD, hiatal hernia  04/2013 CT chest ([name])> findings suggestive of but not diagnostic of NSIP, small pulmonary nodule  05/17/2013 3 month prednisone trial 06/2013 open lung biopsy> UIP   February 2016 CT chest high resolution> slight progression in reticular abnormalities consistent with pulmonary fibrosis, uip  6 minute walk 10/19/2012 walked 500 feet in office on room air oxygenation did not drop below 90% 04/2013 6MW RA > 1100 feet, HR peak 109, O2 sat Nadir 87% 11/2013 6MW 1364 feet, O2 saturation nadir 78% 08/21/14 6MW 1400 feet 90% on 8L 11/2016 6MW '288mg'$  needed [facility]  PFT February 2014 simple spirometry performed by her primary care physician>> ratio 90%, FEV1 1.71 L (64% predicted, FVC 1.83 L 55% predicted; flow volume loop is not consistent with obstruction 11/15/2012 Full PFT [name]>  Ratio 87%, FEV1 2.00L > 2.07 with bronchodilator (3% change); TLC 3.44 L (65% pred), ERV 0.62 (57% pred), DLCO 15.1 ml/mmHg/min (59% pred) 04/2013 Full PFT> Ratio 93%, FEV1 1.92 L (71% pred), TLC 3.02L (56% pred), DLCO 15.94 (59% pred) May 2015 full pulmonary function test ratio 93%, FEV1 1.71 L (64% predicted, no change with bronchodilator), total lung capacity 2.67 L (49% predicted), DLCO 13.11 (40% predicted) 03/2015 PFT > Ratio 93%, FEV1 1.24L, FVC 1.33L (38% pred), TLC 2.56L (47% pred), DLCO 12.10 (44% pred) March 2017 pulmonary function testing ratio 93%, FVC (41% predicted), total lung capacity 2.34 L (43% protected), DLCO 10.53 (39% protected). November 2017 pulmonary function testing ratio 92%, FVC 1.26 L, 37% predicted, total lung capacity 2.37 L  44% predicted, DLCO 5. 89 21% percent predicted December 2018 lung function test FVC 1.14 L, 33% predicted, total lung capacity 2.67 L 49% predicted, DLCO 9.02 mL 33% predicted  Most recent labs reviewed, October 2015 showing normal kidney and liver function per      Assessment & Plan:  UIP (usual interstitial pneumonitis) (HCC)  OSA (obstructive sleep apnea)  Chronic hypoxemic respiratory failure ([place])  Encounter for therapeutic drug monitoring  Gastroesophageal reflux disease, esophagitis presence not specified  Non-seasonal allergic rhinitis due to pollen  Discussion: I am pleased to say that this has been a stable interval for [facility].  Her lung function testing today is stable compared to the 2016 study.  This makes me think that the diffusion capacity measured in 2017 was spuriously low.  Her oxygenation has not changed.  She is stable with Esbriet and CellCept.  She should continue taking this for her IPF with some features suggestive of an underlying connective tissue disease.  Gastroesophageal reflux disease may have also contributed to her fibrosis, it is well controlled now.  She is compliant with CPAP therapy.  Plan: Chronic  respiratory failure with hypoxemia: Keep using 4 L at rest, 6 L on exertion 6-minute walk today  UIP/IPF: Keep taking CellCept 500 mg daily Keep taking Esbriet 801 mg 3 times a day We will get liver function testing on the next visit 6-minute walk today We will repeat a lung function test in 1 year  Obstructive sleep apnea: Keep using CPAP nightly  Allergic rhinitis: Keep taking singulair Use Neil Med rinses with distilled water at least twice per day using the instructions on the package. 1/2 hour after using the [facility] Med rinse, use Nasacort (or flonase) two puffs in each nostril once per day.  Remember that the Nasacort can take 1-2 weeks to work after regular use. Use generic zyrtec (cetirizine) every day.  If this doesn't help, then stop taking it and use chlorpheniramine-phenylephrine combination tablets.  Gastroesophageal reflux disease: Keep taking pantoprazole twice a day  I will see you back in 3 months or sooner if needed     Current Outpatient Medications:  .  acetaminophen (TYLENOL) 500 MG tablet, Take 1,000 mg by mouth every 6 (six) hours as needed for moderate pain., Disp: , Rfl:  .  diclofenac (VOLTAREN) 75 MG EC tablet, Take 1 tablet by mouth two  times daily, Disp: 180 tablet, Rfl: 0 .  diphenhydrAMINE (BENADRYL) 25 mg capsule, Take 25 mg by mouth as needed., Disp: , Rfl:  .  ESBRIET 801 MG TABS, TAKE 1 TABLET BY MOUTH THREE TIMES DAILY, Disp: 90 tablet, Rfl: 5 .  FIBER PO, Take 1 tablet by mouth daily., Disp: , Rfl:  .  fluticasone (FLONASE) 50 MCG/ACT nasal spray, Place 1 spray into both nostrils daily., Disp: , Rfl:  .  levothyroxine (SYNTHROID, LEVOTHROID) 125 MCG tablet, TAKE 1 TABLET BY MOUTH  DAILY BEFORE BREAKFAST, Disp: 90 tablet, Rfl: 3 .  losartan-hydrochlorothiazide (HYZAAR) 50-12.5 MG tablet, Take 1 tablet by mouth daily., Disp: 90 tablet, Rfl: 1 .  metFORMIN (GLUCOPHAGE-XR) 500 MG 24 hr tablet, Take 2 tablets (1,000 mg total) by mouth daily with  breakfast., Disp: 60 tablet, Rfl: 11 .  montelukast (SINGULAIR) 10 MG tablet, Take 1 tablet (10 mg total) by mouth at bedtime., Disp: 90 tablet, Rfl: 3 .  mycophenolate (CELLCEPT) 500 MG tablet, TAKE 1 TABLET BY MOUTH DAILY, Disp: 30 tablet, Rfl: 5 .  NON FORMULARY, Place 3 L into the  nose daily. 4-5 Liters with exertion, Disp: , Rfl:  .  nystatin cream (MYCOSTATIN), Apply 1 application topically 3 (three) times daily., Disp: 30 g, Rfl: 0 .  ONE TOUCH ULTRA TEST test strip, , Disp: , Rfl:  .  ONGLYZA 5 MG TABS tablet, TAKE 1 TABLET BY MOUTH  DAILY, Disp: 90 tablet, Rfl: 1 .  pantoprazole (PROTONIX) 40 MG tablet, TAKE 1 TABLET BY MOUTH TWO  TIMES DAILY, Disp: 180 tablet, Rfl: 1 .  pioglitazone (ACTOS) 45 MG tablet, Take 1 tablet (45 mg total) by mouth daily., Disp: 90 tablet, Rfl: 3 .  pravastatin (PRAVACHOL) 40 MG tablet, Take 1 tablet (40 mg total) by mouth daily., Disp: 90 tablet, Rfl: 3 .  verapamil (CALAN-SR) 180 MG CR tablet, TAKE 1 TABLET BY MOUTH  DAILY, Disp: 90 tablet, Rfl: 1

## 2017-07-10 NOTE — Progress Notes (Signed)
SIX MIN WALK 07/10/2017 12/18/2016 11/13/2016 09/19/2016 06/11/2016 05/13/2016 10/15/2015  Medications - FLonase 95mcg, levothyroxine,cellcept 500mg ,nystatin 100000 unit,protonix 40mg ,pirfenidone,saxagliptin 5mg ,calan 180mg   - Patient took all meds on her medication list except ones that stated to take QHS Voltaren 75mg , Flonase, Glipizide 10mg , Synthriod 171mcg, Hyzaar 50-12.5mg , Singulair 10mg , Cellcept 500mg , Nystatin 100000 units/gm, Protonix 40mg , Actos 45mg , Esbriet 801mg , Pravastatin 20mg , Onglyza 5mg , Calan 180mg  - all meds were taken at 0730 - Took albuterol after PFT  Supplimental Oxygen during Test? (L/min) Yes Yes Yes Yes Yes Yes Yes  O2 Flow Rate 6 6 8 4 3 5 4   Type Continuous Continuous Continuous Continuous Continuous Continuous Continuous  Laps 8 5 - 3 4 - 3  Partial Lap (in Meters) 0 0 - 0 0 - 0  Baseline BP (sitting) 130/80 130/72 - 132/80 152/94 - 118/62  Baseline Heartrate 70 87 - 94 83 - 79  Baseline Dyspnea (Borg Scale) 0 0.5 - 1 0 - 0  Baseline Fatigue (Borg Scale) 0 0 - 4 0 - 0  Baseline SPO2 100 98 - 100 98 - 97  BP (sitting) 158/80 136/80 - 132/68 210/76 - 142/78  Heartrate 110 113 - 116 118 - 106  Dyspnea (Borg Scale) 5 3 - 5 82 - 4  Fatigue (Borg Scale) 5 2 - 9 5 - 3  SPO2 98 92 - 79 5 - 88  BP (sitting) 154/64 132/74 - 126/72 154/84 - 132/78  Heartrate 89 94 - 89 90 - 75  SPO2 100 97 - 99 98 - 99  Stopped or Paused before Six Minutes Yes Yes - Yes Yes - Yes  Other Symptoms at end of Exercise Pt was fatigue after 4 laps at 2:57, desat to 85%, pulse 105, placed her on 8L and then she finished the 6 min walk,  Pt started off walk on 4L pt stopped due to fatigue and dayspnea after lap 2 stats were checked O2 84% 112HR, O2 bumped to 5L pt got back up to 93% 100 HR and walk was continued, pt needed to stop again after lap 3 due to dyspnea stats were checked O2 was 87% 116 HR, bumped up to 6L, 6L got pt up to 94% 117 HR, 6L helped maintained pt above 90% for the rest of walk - pt  stopped at 3:37min left on the clock d/t increased SOB and low O2 saturations.  Pt stopped walk early d/t dyspnea. Pt stopped with 2:54 left on the clock. - Sciatic pain in Right Leg/ stopped with 3:46 left on clock  Interpretation - Leg pain - - Leg pain - Calf pain;Leg pain  Distance Completed 384 240 - 144 192 - 144  Tech Comments: I did notice when she sits down her oxygen level drops TA/CMA Pt walked a steady pace with oxygen tank, she was able to complete walk, pt did express dyspnea, and slight chest tightness during walk Pt was started out w/o 02 98% oxygen hr 103. At 185 ft 02 85 hr 131 8 L; patient walked continuous 02 titrated final lap 6 02 91% hr 114 on 8L. pt stopped at 3:61min left on the clock d/t increased SOB and low O2 saturations. pt was allowed to sit and rest for a few minutes and was then placed back on 4 liters and an ambulatory walk/O2 titration was done to titrate O2. Pt ended up needing 6 liters O2 to maintain her O2 saturations above 88%.  --amg - did not complete lap 3 d/t dyspnea per  pt.  patient stopped walk after with 3:46 minutes left on clock due to Sciatic nerve pain down right leg. BQ aware of results.

## 2017-07-10 NOTE — Patient Instructions (Addendum)
Chronic respiratory failure with hypoxemia: Keep using 4 L at rest, 6 L on exertion 6-minute walk today  UIP/IPF: Keep taking CellCept 500 mg daily Keep taking Esbriet 801 mg 3 times a day We will get liver function testing on the next visit 6-minute walk today We will repeat a lung function test in 1 year  Obstructive sleep apnea: Keep using CPAP nightly  Allergic rhinitis: Keep taking singulair Use Neil Med rinses with distilled water at least twice per day using the instructions on the package. 1/2 hour after using the [facility] Med rinse, use Nasacort (or flonase) two puffs in each nostril once per day.  Remember that the Nasacort can take 1-2 weeks to work after regular use. Use generic zyrtec (cetirizine) every day.  If this doesn't help, then stop taking it and use chlorpheniramine-phenylephrine combination tablets.  Gastroesophageal reflux disease: Keep taking pantoprazole twice a day  I will see you back in 3 months or sooner if needed

## 2017-07-10 NOTE — Progress Notes (Signed)
PFT completed today 07/10/17

## 2017-07-24 ENCOUNTER — Telehealth: Payer: Self-pay

## 2017-07-24 NOTE — Telephone Encounter (Signed)
PA has been started via CMM. determination will be mae within 72 hours.  Will route to [place] to f/u on.  [id number]

## 2017-07-27 NOTE — Telephone Encounter (Signed)
PA was approved. Reference number for case is [id number]   Specialty pharmacy aware and so is patient. Nothing further needed.

## 2017-08-04 DIAGNOSIS — G4733 Obstructive sleep apnea (adult) (pediatric): Secondary | ICD-10-CM | POA: Diagnosis not present

## 2017-08-04 DIAGNOSIS — J8489 Other specified interstitial pulmonary diseases: Secondary | ICD-10-CM | POA: Diagnosis not present

## 2017-08-04 DIAGNOSIS — J84111 Idiopathic interstitial pneumonia, not otherwise specified: Secondary | ICD-10-CM | POA: Diagnosis not present

## 2017-09-03 ENCOUNTER — Telehealth: Payer: Self-pay | Admitting: Family Medicine

## 2017-09-03 NOTE — Telephone Encounter (Signed)
Copied from [place] [id number]. Topic: Quick Communication - See Telephone Encounter >> Sep 03, 2017 12:11 PM Cleaster Corin, NT wrote: CRM for notification. See Telephone encounter for:   09/03/17. Pt. Calling and stated that daughter who is living with her has tested post. For flu. Pt. Is not showing any symptoms at this time, but wants to know if tamiflu can be called in as a prevention.  [place], [place] - [address] [place] [address] Phone: [phone] Fax: [phone]

## 2017-09-04 ENCOUNTER — Other Ambulatory Visit (INDEPENDENT_AMBULATORY_CARE_PROVIDER_SITE_OTHER): Payer: Medicare Other

## 2017-09-04 ENCOUNTER — Telehealth: Payer: Self-pay | Admitting: Family Medicine

## 2017-09-04 DIAGNOSIS — J8489 Other specified interstitial pulmonary diseases: Secondary | ICD-10-CM | POA: Diagnosis not present

## 2017-09-04 DIAGNOSIS — E1165 Type 2 diabetes mellitus with hyperglycemia: Secondary | ICD-10-CM

## 2017-09-04 LAB — COMPREHENSIVE METABOLIC PANEL
ALBUMIN: 4.2 g/dL (ref 3.5–5.2)
ALK PHOS: 88 U/L (ref 39–117)
ALT: 11 U/L (ref 0–35)
AST: 12 U/L (ref 0–37)
BUN: 17 mg/dL (ref 6–23)
CALCIUM: 9.5 mg/dL (ref 8.4–10.5)
CO2: 31 mEq/L (ref 19–32)
Chloride: 97 mEq/L (ref 96–112)
Creatinine, Ser: 0.73 mg/dL (ref 0.40–1.20)
GFR: 84.58 mL/min (ref >60.00)
Glucose, Bld: 189 mg/dL — ABNORMAL HIGH (ref 70–99)
POTASSIUM: 4 meq/L (ref 3.5–5.1)
Sodium: 138 mEq/L (ref 135–145)
TOTAL PROTEIN: 7.5 g/dL (ref 6.0–8.3)
Total Bilirubin: 0.3 mg/dL (ref 0.2–1.2)

## 2017-09-04 LAB — LIPID PANEL
CHOLESTEROL: 214 mg/dL — AB (ref 0–200)
HDL: 58.9 mg/dL (ref >39.00)
NonHDL: 154.87
Total CHOL/HDL Ratio: 4
Triglycerides: 212 mg/dL — ABNORMAL HIGH (ref 0.0–149.0)
VLDL: 42.4 mg/dL — ABNORMAL HIGH (ref 0.0–40.0)

## 2017-09-04 LAB — LDL CHOLESTEROL, DIRECT: Direct LDL: 104 mg/dL

## 2017-09-04 LAB — HEMOGLOBIN A1C: Hgb A1c MFr Bld: 7.4 % — ABNORMAL HIGH (ref 4.6–6.5)

## 2017-09-04 MED ORDER — OSELTAMIVIR PHOSPHATE 75 MG PO CAPS: 75.0000 mg | ORAL_CAPSULE | Freq: Every day | ORAL | 0 refills | Status: AC

## 2017-09-04 NOTE — Telephone Encounter (Signed)
-----   Message from Ellamae Sia sent at 08/27/2017  5:02 PM EST ----- Regarding: Lab orders for Friday, 2.15.19 Lab orders for a 3 month follow up appt.

## 2017-09-04 NOTE — Telephone Encounter (Signed)
Yes.. I will send in rx for preventative course

## 2017-09-04 NOTE — Telephone Encounter (Signed)
Kaitlin Hamilton notified by telephone that Tamiflu prescription has been sent to [facility] in [place].

## 2017-09-08 ENCOUNTER — Ambulatory Visit (INDEPENDENT_AMBULATORY_CARE_PROVIDER_SITE_OTHER): Payer: Medicare Other | Admitting: Family Medicine

## 2017-09-08 ENCOUNTER — Encounter: Payer: Self-pay | Admitting: Family Medicine

## 2017-09-08 VITALS — BP 120/70 | HR 80 | Temp 97.8°F | Ht 66.0 in | Wt 255.5 lb

## 2017-09-08 DIAGNOSIS — E7800 Pure hypercholesterolemia, unspecified: Secondary | ICD-10-CM | POA: Diagnosis not present

## 2017-09-08 DIAGNOSIS — R059 Cough, unspecified: Secondary | ICD-10-CM | POA: Insufficient documentation

## 2017-09-08 DIAGNOSIS — R05 Cough: Secondary | ICD-10-CM

## 2017-09-08 DIAGNOSIS — I10 Essential (primary) hypertension: Secondary | ICD-10-CM | POA: Diagnosis not present

## 2017-09-08 DIAGNOSIS — E1165 Type 2 diabetes mellitus with hyperglycemia: Secondary | ICD-10-CM | POA: Diagnosis not present

## 2017-09-08 LAB — HM DIABETES FOOT EXAM

## 2017-09-08 NOTE — Patient Instructions (Addendum)
Complete Tamiflu course at treatment dose twice daily. Get back on track with low carb low chol diet.

## 2017-09-08 NOTE — Progress Notes (Signed)
Subjective:    Patient ID: Kaitlin Hamilton, female    DOB: July 15, 1951, 67 y.o.   MRN: [id number]  HPI   67 year old female present for 3 month follow up.  She has also had new change in sputum in setting of UIP, on chronic oxygen.  Productive cough  greenish sputum x 6 days. Fatigue, no body aches, no fever.  Daughter has flu.Netta Cedars been on tamiflu for prevention ( she was actually having symptoms at that time.  She is using tylenol.  She feels dramatically better now. No significant change in SOB, no wheeze.  Diabetes:  Improved A1C on  metformin to 2000 mg daily., max actos and on onglyza Lab Results  Component Value Date   HGBA1C 7.4 (H) 09/04/2017  Using medications without difficulties: Hypoglycemic episodes: Hyperglycemic episodes: Feet problems:none Blood Sugars averaging: FBS 132-174 eye exam within last year: yes  Elevated Cholesterol:  Almost at goal LDL < 100 on pravastatin Lab Results  Component Value Date   CHOL 214 (H) 09/04/2017   HDL 58.90 09/04/2017   LDLCALC 101 (H) 01/27/2017   LDLDIRECT 104.0 09/04/2017   TRIG 212.0 (H) 09/04/2017   CHOLHDL 4 09/04/2017  Using medications without problems: none Muscle aches: none Diet compliance: She has been eating more carbs and gaining weight Exercise: minimal given pulm issues Other complaints:   Social History /Family History/Past Medical History reviewed in detail and updated in EMR if needed. Blood pressure 120/70, pulse 80, temperature 97.8 F (36.6 C), temperature source Oral, height 5\' 6"  (1.676 m), weight 255 lb 8 oz (115.9 kg), SpO2 99 %.   Review of Systems  Constitutional: Negative for fatigue and fever.  HENT: Positive for congestion.   Eyes: Negative for pain.  Respiratory: Positive for cough and shortness of breath.   Cardiovascular: Negative for chest pain, palpitations and leg swelling.  Gastrointestinal: Negative for abdominal pain.  Genitourinary: Negative for dysuria and vaginal bleeding.    Musculoskeletal: Negative for back pain.  Neurological: Negative for syncope, light-headedness and headaches.  Psychiatric/Behavioral: Negative for dysphoric mood.       Objective:   Physical Exam  Constitutional: Vital signs are normal. She appears well-developed and well-nourished. She is cooperative.  Non-toxic appearance. She does not appear ill. No distress.  obese  HENT:  Head: Normocephalic.  Right Ear: Hearing, tympanic membrane, external ear and ear canal normal. Tympanic membrane is not erythematous, not retracted and not bulging.  Left Ear: Hearing, tympanic membrane, external ear and ear canal normal. Tympanic membrane is not erythematous, not retracted and not bulging.  Nose: No mucosal edema or rhinorrhea. Right sinus exhibits no maxillary sinus tenderness and no frontal sinus tenderness. Left sinus exhibits no maxillary sinus tenderness and no frontal sinus tenderness.  Mouth/Throat: Uvula is midline, oropharynx is clear and moist and mucous membranes are normal.  Eyes: Conjunctivae, EOM and lids are normal. Pupils are equal, round, and reactive to light. Lids are everted and swept, no foreign bodies found.  Neck: Trachea normal and normal range of motion. Neck supple. Carotid bruit is not present. No thyroid mass and no thyromegaly present.  Cardiovascular: Normal rate, regular rhythm, S1 normal, S2 normal, normal heart sounds, intact distal pulses and normal pulses. Exam reveals no gallop and no friction rub.  No murmur heard. Pulmonary/Chest: Effort normal. No tachypnea. No respiratory distress. She has decreased breath sounds. She has no wheezes. She has no rhonchi. She has rales in the right lower field and the left  lower field.  Abdominal: Soft. Normal appearance and bowel sounds are normal. There is no tenderness.  Neurological: She is alert.  Skin: Skin is warm, dry and intact. No rash noted.  Psychiatric: Her speech is normal and behavior is normal. Judgment and  thought content normal. Her mood appears not anxious. Cognition and memory are normal. She does not exhibit a depressed mood.          Assessment & Plan:

## 2017-09-21 ENCOUNTER — Other Ambulatory Visit: Payer: Self-pay | Admitting: Family Medicine

## 2017-09-30 NOTE — Assessment & Plan Note (Signed)
Almost at goal on pravastatin.

## 2017-09-30 NOTE — Assessment & Plan Note (Signed)
Improved A1C on  metformin to 2000 mg daily., max actos and on onglyza

## 2017-09-30 NOTE — Assessment & Plan Note (Signed)
Concerning for flu. Treat with tamiflu but given now with symptoms.. Change to treatment dose from prevention dose.

## 2017-09-30 NOTE — Assessment & Plan Note (Signed)
Well controlled. Continue current medication.  

## 2017-10-02 DIAGNOSIS — J8489 Other specified interstitial pulmonary diseases: Secondary | ICD-10-CM | POA: Diagnosis not present

## 2017-10-03 ENCOUNTER — Other Ambulatory Visit: Payer: Self-pay | Admitting: Pulmonary Disease

## 2017-10-04 ENCOUNTER — Other Ambulatory Visit: Payer: Self-pay | Admitting: Pulmonary Disease

## 2017-10-05 ENCOUNTER — Telehealth: Payer: Self-pay | Admitting: Pulmonary Disease

## 2017-10-05 MED ORDER — DOXYCYCLINE HYCLATE 100 MG PO TABS: 100.0000 mg | ORAL_TABLET | Freq: Two times a day (BID) | ORAL | 0 refills | Status: DC

## 2017-10-05 NOTE — Telephone Encounter (Signed)
Called patient unable to reach left message to give us a call back.

## 2017-10-05 NOTE — Telephone Encounter (Signed)
Confirm that she has been taking zyrtec daily and flonase or nasacort daily for the last 2-3 weeks.  If the answer is yes to both then call in doxycycline 100mg  po bid x 5 days.  If the answer is no then tell her to take these medicines to treat the symptoms.

## 2017-10-05 NOTE — Telephone Encounter (Signed)
Pt states she has been taking Zyrtec and Flonase daily since 2015. Rx for Doxy 100mg  has been sent to preferred pharmacy. Pt is aware and voiced her understanding. Nothing further is needed.

## 2017-10-05 NOTE — Telephone Encounter (Signed)
Called and spoke with pt.  Pt reports of nasal drainage light green mucus, sneezing, chest tightness mainly at night, watery eyes & sob with exertion.  Pt states she is unable to wear cpap all night, due to nasal drainage. Pt states neti pot causes sores in her nose.  She also stated that she has been taken Benadryl for the past five days but stopped today due to shaking.  Denies fever, chills, sweats or body aches.   BQ please advise. Thanks

## 2017-10-05 NOTE — Telephone Encounter (Signed)
Patient returning call - she can be reached at [phone] -pr

## 2017-10-09 ENCOUNTER — Other Ambulatory Visit: Payer: Self-pay | Admitting: Family Medicine

## 2017-10-14 ENCOUNTER — Encounter: Payer: Self-pay | Admitting: Pulmonary Disease

## 2017-10-14 ENCOUNTER — Other Ambulatory Visit (INDEPENDENT_AMBULATORY_CARE_PROVIDER_SITE_OTHER): Payer: Medicare Other

## 2017-10-14 ENCOUNTER — Ambulatory Visit: Payer: Medicare Other | Admitting: Pulmonary Disease

## 2017-10-14 VITALS — BP 128/68 | HR 77 | Ht 66.0 in | Wt 254.0 lb

## 2017-10-14 DIAGNOSIS — J3089 Other allergic rhinitis: Secondary | ICD-10-CM | POA: Diagnosis not present

## 2017-10-14 DIAGNOSIS — Z5181 Encounter for therapeutic drug level monitoring: Secondary | ICD-10-CM | POA: Diagnosis not present

## 2017-10-14 DIAGNOSIS — J84112 Idiopathic pulmonary fibrosis: Secondary | ICD-10-CM | POA: Diagnosis not present

## 2017-10-14 DIAGNOSIS — J9611 Chronic respiratory failure with hypoxia: Secondary | ICD-10-CM | POA: Diagnosis not present

## 2017-10-14 DIAGNOSIS — Z23 Encounter for immunization: Secondary | ICD-10-CM

## 2017-10-14 DIAGNOSIS — G4733 Obstructive sleep apnea (adult) (pediatric): Secondary | ICD-10-CM

## 2017-10-14 LAB — HEPATIC FUNCTION PANEL
ALK PHOS: 87 U/L (ref 39–117)
ALT: 10 U/L (ref 0–35)
AST: 11 U/L (ref 0–37)
Albumin: 4.1 g/dL (ref 3.5–5.2)
BILIRUBIN DIRECT: 0.1 mg/dL (ref 0.0–0.3)
TOTAL PROTEIN: 7.6 g/dL (ref 6.0–8.3)
Total Bilirubin: 0.3 mg/dL (ref 0.2–1.2)

## 2017-10-14 MED ORDER — MONTELUKAST SODIUM 10 MG PO TABS: 10.0000 mg | ORAL_TABLET | Freq: Every day | ORAL | 3 refills | Status: DC

## 2017-10-14 MED ORDER — MYCOPHENOLATE MOFETIL 500 MG PO TABS: 500.0000 mg | ORAL_TABLET | Freq: Every day | ORAL | 3 refills | Status: DC

## 2017-10-14 NOTE — Progress Notes (Signed)
Subjective:    Patient ID: Kaitlin Hamilton, female    DOB: 17-May-1951, 67 y.o.   MRN: [id number]  Synopsis: Kaitlin Hamilton is a very pleasant 67 year old female who first saw the [facility] pulmonary clinic in March 2014 for evaluation of shortness of breath. She had significant cough, wheezing, and sputum production requiring multiple rounds of antibiotics. Because of crackles on lung exam and an abnormal pulmonary function test she was referred to [place]. We ordered full pulmonary function testing which showed moderate restriction and a depressed DLCO in proportion to her restriction. There is no airflow obstruction and no change with bronchodilator administration. A chest x-ray performed in February 2014 was read as normal.  An open lung biopsy was performed in December 2014 showing UIP.  Because she has had multiple good responses to steroids, we have treated her with cellcept and prednisone.  In January 2015 she had severe insomnia, hallucinations and gastritis on high dose prednisone.  She had a rash to bactrim. Since February 2015 she has been on cellcept and low dose prednisone and dapsone. Because of progression in hypoxemia and lung function testing in 2016 we changed her therapy to anti-fibrotic therapy with Esbriet. Because of a strong concern for an underlying connective tissue disease it was decided that she will be maintained on low-dose CellCept.   HPI Chief Complaint  Patient presents with  . Follow-up    pt recovering from flu, states she is doing well.  needs cellcept refill.     [name] had the flu recently and had a lot of cough and congestion and felt really sick. She says that she called here and couldn't reach anyone.  She is better now and doing fairly well.  She had a little gastroenteritis a few weeks ago as well but has recovered from that.  She then had another sinus infection after that and took doxycycline which helped.    She is now taking her allergy medicines; zyrtec and  generic fluticasone regularly.  She still has some itchy eyes and scratchy throat.    She is still using her oxygen reguarly and benefitting from it.  She had a little dyspnea when she was sick, but she is better now.    Past Medical History:  Diagnosis Date  . Allergic rhinitis    uses Flonase daily  . Anesthesia complication    Per pt/ past endoscopy in 2015, the anesthetic spray in back throat caused her to have breathing problems  . Anxiety   . Bronchitis   . Chicken pox   . Constipation    takes Fiber daily  . Depression   . Diverticulosis   . Dysphagia   . GERD (gastroesophageal reflux disease)    takes Protonix daily  . H/O hiatal hernia   . Hemorrhoids   . History of bronchitis    2013  . History of colon polyps   . History of kidney stones   . Hyperlipidemia    lost 43 pounds and no meds required at present  . Hypertension    takes Verapamil and HCTZ daily  . Hypokalemia   . Hypothyroidism (acquired)    takes Synthroid daily  . Insomnia    doesn't take any meds for this  . Joint swelling    left thumb  . Migraines    last one about a month ago  . Non-insulin dependent type 2 diabetes mellitus ([address])    takes Glipizide daily  . OA (osteoarthritis)    left  knee  . Oxygen deficiency   . Oxygen dependent    Pt is on continuous O2 at 3 liters!  . Pneumonia    last time in 2006  . PONV (postoperative nausea and vomiting)   . Shortness of breath    with exertion;takes Singulair daily as well as Flonase  . Urinary frequency         Review of Systems  Constitutional: Negative for activity change, chills, fatigue and fever.  HENT: Negative for congestion, ear pain, nosebleeds, postnasal drip, rhinorrhea and sinus pressure.   Respiratory: Positive for shortness of breath. Negative for cough and wheezing.   Cardiovascular: Negative for chest pain, palpitations and leg swelling.  Gastrointestinal: Negative for abdominal pain and nausea.       Objective:    Physical Exam  Vitals:   10/14/17 0855  BP: 128/68  Pulse: 77  SpO2: 98%  Weight: 254 lb (115.2 kg)  Height: 5' 6" (1.676 m)  4L [place]  Gen: well appearing HENT: OP clear, TM's clear, neck supple PULM: Crackles bases B, normal percussion CV: RRR, no mgr, trace edema GI: BS+, soft, nontender Derm: no cyanosis or rash Psyche: normal mood and affect    February 2014 [facility] at [facility] normal   11/2012 CT chest >> ( read) centrilobular nodules, interlobular septal thickening worse in bases and periphery R lung > L; some GGO in bases and periphery as well, some bronchiectasis in the bases R > L; findings suggestive of fibrosis but not UIP; question hypersensitivity pneumonitis given centrilobular nodules; also question aspiration  03/2013 ANA, ANCA, Anti-Jo-1, ESR, RF, SCL-70, anti-centromere, SSA/SSB, all negative; CRP 0.6;  04/2013 Barium swallow> abnormal esophageal motility, GERD, hiatal hernia  04/2013 CT chest ([name])> findings suggestive of but not diagnostic of NSIP, small pulmonary nodule  05/17/2013 3 month prednisone trial 06/2013 open lung biopsy> UIP   February 2016 CT chest high resolution> slight progression in reticular abnormalities consistent with pulmonary fibrosis, uip  6 minute walk 10/19/2012 walked 500 feet in office on room air oxygenation did not drop below 90% 04/2013 6MW RA > 1100 feet, HR peak 109, O2 sat Nadir 87% 11/2013 6MW 1364 feet, O2 saturation nadir 78% 08/21/14 6MW 1400 feet 90% on 8L 11/2016 6MW 233m needed 6LNC  PFT February 2014 simple spirometry performed by her primary care physician>> ratio 90%, FEV1 1.71 L (64% predicted, FVC 1.83 L 55% predicted; flow volume loop is not consistent with obstruction 11/15/2012 Full PFT [name]> Ratio 87%, FEV1 2.00L > 2.07 with bronchodilator (3% change); TLC 3.44 L (65% pred), ERV 0.62 (57% pred), DLCO 15.1 ml/mmHg/min (59% pred) 04/2013 Full PFT> Ratio 93%, FEV1 1.92 L (71% pred), TLC 3.02L (56%  pred), DLCO 15.94 (59% pred) May 2015 full pulmonary function test ratio 93%, FEV1 1.71 L (64% predicted, no change with bronchodilator), total lung capacity 2.67 L (49% predicted), DLCO 13.11 (40% predicted) 03/2015 PFT > Ratio 93%, FEV1 1.24L, FVC 1.33L (38% pred), TLC 2.56L (47% pred), DLCO 12.10 (44% pred) March 2017 pulmonary function testing ratio 93%, FVC (41% predicted), total lung capacity 2.34 L (43% protected), DLCO 10.53 (39% protected). November 2017 pulmonary function testing ratio 92%, FVC 1.26 L, 37% predicted, total lung capacity 2.37 L 44% predicted, DLCO 5. 89 21% percent predicted December 2018 lung function test FVC 1.14 L, 33% predicted, total lung capacity 2.67 L 49% predicted, DLCO 9.02 mL 33% predicted  Most recent labs reviewed, October 2015 showing normal kidney and liver function per  Assessment & Plan:  Therapeutic drug monitoring - Plan: Hepatic function panel  Other allergic rhinitis - Plan: montelukast (SINGULAIR) 10 MG tablet  UIP (usual interstitial pneumonitis) (HCC)  Chronic hypoxemic respiratory failure (HCC)  OSA (obstructive sleep apnea)  Discussion: This has been a stable interval for Kaitlin Hamilton despite all of her infections recently.  She is feeling fine today.  She is having a bit more allergy symptoms.  She remains compliant with her Esbriet, CellCept, and oxygen.   Plan: IPF: Keep taking Mycophenolate and Esbriet as you are doing We will check liver function test today to make sure that you are not having problems from the [place] yourself in the sun, wear good sunscreen On the next visit we will get a 6-minute walk test We will plan on getting a lung function test in December 2019  Obstructive sleep apnea: Keep using CPAP every night  Allergic rhinitis: Start taking Singulair again Take generic Zyrtec daily Use the generic Flonase daily If your symptoms worsen use saline rinses with something like Neomed  rinse  Gastroesophageal reflux disease: Keep taking pantoprazole twice a day  Chronic respiratory failure with hypoxemia: Keep using 4 L of oxygen at rest 6 L on exertion  We will see you back in 3 months or sooner if needed    Current Outpatient Medications:  .  acetaminophen (TYLENOL) 500 MG tablet, Take 1,000 mg by mouth every 6 (six) hours as needed for moderate pain., Disp: , Rfl:  .  diclofenac (VOLTAREN) 75 MG EC tablet, Take 1 tablet by mouth two  times daily, Disp: 180 tablet, Rfl: 0 .  diphenhydrAMINE (BENADRYL) 25 mg capsule, Take 25 mg by mouth as needed., Disp: , Rfl:  .  ESBRIET 801 MG TABS, TAKE 801MG BY MOUTH 3 TIMES A DAY WITH FOOD, Disp: 90 tablet, Rfl: 11 .  FIBER PO, Take 1 tablet by mouth daily., Disp: , Rfl:  .  fluticasone (FLONASE) 50 MCG/ACT nasal spray, Place 1 spray into both nostrils daily., Disp: , Rfl:  .  levothyroxine (SYNTHROID, LEVOTHROID) 125 MCG tablet, TAKE 1 TABLET BY MOUTH  DAILY BEFORE BREAKFAST, Disp: 90 tablet, Rfl: 3 .  losartan-hydrochlorothiazide (HYZAAR) 50-12.5 MG tablet, Take 1 tablet by mouth daily., Disp: 90 tablet, Rfl: 1 .  metFORMIN (GLUCOPHAGE-XR) 500 MG 24 hr tablet, Take 2 tablets (1,000 mg total) by mouth daily with breakfast., Disp: 60 tablet, Rfl: 11 .  montelukast (SINGULAIR) 10 MG tablet, Take 1 tablet (10 mg total) by mouth at bedtime., Disp: 90 tablet, Rfl: 3 .  mycophenolate (CELLCEPT) 500 MG tablet, Take 1 tablet (500 mg total) by mouth daily., Disp: 90 tablet, Rfl: 3 .  NON FORMULARY, Place 3 L into the nose daily. 4-5 Liters with exertion, Disp: , Rfl:  .  nystatin cream (MYCOSTATIN), Apply 1 application topically 3 (three) times daily., Disp: 30 g, Rfl: 0 .  ONE TOUCH ULTRA TEST test strip, , Disp: , Rfl:  .  ONGLYZA 5 MG TABS tablet, TAKE 1 TABLET BY MOUTH  DAILY, Disp: 90 tablet, Rfl: 1 .  pantoprazole (PROTONIX) 40 MG tablet, TAKE 1 TABLET BY MOUTH TWO  TIMES DAILY, Disp: 180 tablet, Rfl: 1 .  pioglitazone (ACTOS) 45  MG tablet, Take 1 tablet (45 mg total) by mouth daily., Disp: 90 tablet, Rfl: 3 .  pravastatin (PRAVACHOL) 40 MG tablet, Take 1 tablet (40 mg total) by mouth daily., Disp: 90 tablet, Rfl: 3 .  verapamil (CALAN-SR) 180 MG CR tablet, TAKE 1  TABLET BY MOUTH  DAILY, Disp: 90 tablet, Rfl: 1

## 2017-10-14 NOTE — Patient Instructions (Signed)
Keep taking Mycophenolate and Esbriet as you are doing We will check liver function test today to make sure that you are not having problems from the [place] yourself in the sun, wear good sunscreen On the next visit we will get a 6-minute walk test We will plan on getting a lung function test in December 2019  Obstructive sleep apnea: Keep using CPAP every night  Allergic rhinitis: Start taking Singulair again Take generic Zyrtec daily Use the generic Flonase daily If your symptoms worsen use saline rinses with something like Neomed rinse  Gastroesophageal reflux disease: Keep taking pantoprazole twice a day  Chronic respiratory failure with hypoxemia: Keep using 4 L of oxygen at rest 6 L on exertion  We will see you back in 3 months or sooner if needed

## 2017-11-02 DIAGNOSIS — J8489 Other specified interstitial pulmonary diseases: Secondary | ICD-10-CM | POA: Diagnosis not present

## 2017-11-30 ENCOUNTER — Other Ambulatory Visit: Payer: Self-pay | Admitting: Family Medicine

## 2017-12-02 DIAGNOSIS — J8489 Other specified interstitial pulmonary diseases: Secondary | ICD-10-CM | POA: Diagnosis not present

## 2017-12-03 ENCOUNTER — Telehealth: Payer: Self-pay | Admitting: Family Medicine

## 2017-12-03 DIAGNOSIS — E1165 Type 2 diabetes mellitus with hyperglycemia: Secondary | ICD-10-CM

## 2017-12-03 DIAGNOSIS — E7800 Pure hypercholesterolemia, unspecified: Secondary | ICD-10-CM

## 2017-12-03 DIAGNOSIS — E039 Hypothyroidism, unspecified: Secondary | ICD-10-CM

## 2017-12-03 NOTE — Telephone Encounter (Signed)
-----   Message from Lendon Collar, RT sent at 11/25/2017  5:22 PM EDT ----- Regarding: Lab orders for 12/04/17 Please enter lab orders for Friday May 17th. Thanks-Lauren

## 2017-12-04 ENCOUNTER — Other Ambulatory Visit (INDEPENDENT_AMBULATORY_CARE_PROVIDER_SITE_OTHER): Payer: Medicare Other

## 2017-12-04 DIAGNOSIS — E7800 Pure hypercholesterolemia, unspecified: Secondary | ICD-10-CM

## 2017-12-04 DIAGNOSIS — E1165 Type 2 diabetes mellitus with hyperglycemia: Secondary | ICD-10-CM | POA: Diagnosis not present

## 2017-12-04 LAB — COMPREHENSIVE METABOLIC PANEL
ALBUMIN: 4.2 g/dL (ref 3.5–5.2)
ALT: 12 U/L (ref 0–35)
AST: 12 U/L (ref 0–37)
Alkaline Phosphatase: 73 U/L (ref 39–117)
BUN: 11 mg/dL (ref 6–23)
CALCIUM: 9.9 mg/dL (ref 8.4–10.5)
CO2: 34 mEq/L — ABNORMAL HIGH (ref 19–32)
Chloride: 98 mEq/L (ref 96–112)
Creatinine, Ser: 0.67 mg/dL (ref 0.40–1.20)
GFR: 93.3 mL/min (ref >60.00)
GLUCOSE: 172 mg/dL — AB (ref 70–99)
POTASSIUM: 4 meq/L (ref 3.5–5.1)
Sodium: 140 mEq/L (ref 135–145)
Total Bilirubin: 0.3 mg/dL (ref 0.2–1.2)
Total Protein: 7.7 g/dL (ref 6.0–8.3)

## 2017-12-04 LAB — LIPID PANEL
CHOL/HDL RATIO: 3
CHOLESTEROL: 196 mg/dL (ref 0–200)
HDL: 73.4 mg/dL (ref >39.00)
LDL Cholesterol: 97 mg/dL (ref 0–99)
NonHDL: 122.77
TRIGLYCERIDES: 131 mg/dL (ref 0.0–149.0)
VLDL: 26.2 mg/dL (ref 0.0–40.0)

## 2017-12-04 LAB — HEMOGLOBIN A1C: Hgb A1c MFr Bld: 7.4 % — ABNORMAL HIGH (ref 4.6–6.5)

## 2017-12-08 ENCOUNTER — Encounter: Payer: Self-pay | Admitting: Family Medicine

## 2017-12-08 ENCOUNTER — Ambulatory Visit (INDEPENDENT_AMBULATORY_CARE_PROVIDER_SITE_OTHER): Payer: Medicare Other | Admitting: Family Medicine

## 2017-12-08 VITALS — BP 136/62 | HR 79 | Temp 98.3°F | Ht 66.0 in | Wt 262.0 lb

## 2017-12-08 DIAGNOSIS — E1165 Type 2 diabetes mellitus with hyperglycemia: Secondary | ICD-10-CM

## 2017-12-08 DIAGNOSIS — I10 Essential (primary) hypertension: Secondary | ICD-10-CM

## 2017-12-08 DIAGNOSIS — J84112 Idiopathic pulmonary fibrosis: Secondary | ICD-10-CM | POA: Diagnosis not present

## 2017-12-08 DIAGNOSIS — F3340 Major depressive disorder, recurrent, in remission, unspecified: Secondary | ICD-10-CM | POA: Insufficient documentation

## 2017-12-08 DIAGNOSIS — E7800 Pure hypercholesterolemia, unspecified: Secondary | ICD-10-CM

## 2017-12-08 DIAGNOSIS — G4733 Obstructive sleep apnea (adult) (pediatric): Secondary | ICD-10-CM | POA: Diagnosis not present

## 2017-12-08 DIAGNOSIS — F321 Major depressive disorder, single episode, moderate: Secondary | ICD-10-CM

## 2017-12-08 DIAGNOSIS — J84111 Idiopathic interstitial pneumonia, not otherwise specified: Secondary | ICD-10-CM | POA: Diagnosis not present

## 2017-12-08 DIAGNOSIS — J8489 Other specified interstitial pulmonary diseases: Secondary | ICD-10-CM | POA: Diagnosis not present

## 2017-12-08 DIAGNOSIS — F5104 Psychophysiologic insomnia: Secondary | ICD-10-CM | POA: Diagnosis not present

## 2017-12-08 MED ORDER — TRAZODONE HCL 50 MG PO TABS: 25.0000 mg | ORAL_TABLET | Freq: Every evening | ORAL | 3 refills | Status: DC | PRN

## 2017-12-08 NOTE — Patient Instructions (Signed)
Start trial of trazodone at night for sleep.  Work on increasing activity as much as able.

## 2017-12-08 NOTE — Assessment & Plan Note (Signed)
Start trazodone at bedtime. Hopefully getting better sleep will help with MDD.

## 2017-12-08 NOTE — Assessment & Plan Note (Signed)
Well controlled. Continue current medication.  

## 2017-12-08 NOTE — Assessment & Plan Note (Signed)
Start with treatment of insomnia. If not improving will likely need SSRI or SNRI to treat. Offered counseling, will consider.

## 2017-12-08 NOTE — Assessment & Plan Note (Addendum)
Inadequate control but she cannot focus of lifestyle changes until motivation, sleep and mood improved.

## 2017-12-08 NOTE — Progress Notes (Signed)
Subjective:    Patient ID: Kaitlin Hamilton, female    DOB: 08-12-1950, 67 y.o.   MRN: [id number]  HPI  67 year old female presents for  3 month follow up.  She is having a difficulty sleeping at night.. Trouble falling asleep and staying asleep.  She is getting up a lot at night to urinate. Using CPAP.  No napping.  She is tearful talking about her lung issues. Scared as she is getting wrose. Going to prayer meetings.  Diabetes:  Slight improvement in  Home CBG in last month.on  metformin to 2000 mg daily., max actos and on onglyza Lab Results  Component Value Date   HGBA1C 7.4 (H) 12/04/2017  Using medications without difficulties: Hypoglycemic episodes:none Hyperglycemic episodes:none Feet problems:none Blood Sugars averaging: 145- eye exam within last year: yes  Elevated Cholesterol:  Good control on  [pravastatin. Lab Results  Component Value Date   CHOL 196 12/04/2017   HDL 73.40 12/04/2017   LDLCALC 97 12/04/2017   LDLDIRECT 104.0 09/04/2017   TRIG 131.0 12/04/2017   CHOLHDL 3 12/04/2017  Using medications without problems: Muscle aches:  Diet compliance: moderate Exercise:minimal Other complaints:  Hypertension:     Good control. BP Readings from Last 3 Encounters:  12/08/17 136/62  10/14/17 128/68  09/08/17 120/70  Using medication without problems or lightheadedness: none Chest pain with exertion:none Edema:none Short of breath: yes. Limiting, on continuous o2 Average home BPs: Other issues:  Social History /Family History/Past Medical History reviewed in detail and updated in EMR if needed. Blood pressure 136/62, pulse 79, temperature 98.3 F (36.8 C), temperature source Oral, height 5\' 6"  (1.676 m), weight 262 lb (118.8 kg), SpO2 96 %.   Review of Systems  Constitutional: Positive for fatigue. Negative for fever.  HENT: Negative for ear pain.   Eyes: Negative for pain.  Respiratory: Positive for cough and shortness of breath.   Cardiovascular:  Negative for chest pain, palpitations and leg swelling.  Gastrointestinal: Negative for abdominal pain.  Psychiatric/Behavioral: Positive for dysphoric mood and sleep disturbance. Negative for self-injury and suicidal ideas. The patient is not nervous/anxious.        Objective:   Physical Exam  Constitutional: Vital signs are normal. She appears well-developed and well-nourished. She is cooperative.  Non-toxic appearance. She does not appear ill. No distress.  obese  HENT:  Head: Normocephalic.  Right Ear: Hearing, tympanic membrane, external ear and ear canal normal. Tympanic membrane is not erythematous, not retracted and not bulging.  Left Ear: Hearing, tympanic membrane, external ear and ear canal normal. Tympanic membrane is not erythematous, not retracted and not bulging.  Nose: No mucosal edema or rhinorrhea. Right sinus exhibits no maxillary sinus tenderness and no frontal sinus tenderness. Left sinus exhibits no maxillary sinus tenderness and no frontal sinus tenderness.  Mouth/Throat: Uvula is midline, oropharynx is clear and moist and mucous membranes are normal.  Eyes: Pupils are equal, round, and reactive to light. Conjunctivae, EOM and lids are normal. Lids are everted and swept, no foreign bodies found.  Neck: Trachea normal and normal range of motion. Neck supple. Carotid bruit is not present. No thyroid mass and no thyromegaly present.  Cardiovascular: Normal rate, regular rhythm, S1 normal, S2 normal, normal heart sounds, intact distal pulses and normal pulses. Exam reveals no gallop and no friction rub.  No murmur heard. Pulmonary/Chest: Effort normal. No tachypnea. No respiratory distress. She has decreased breath sounds. She has no wheezes. She has no rhonchi. She has rales  in the right lower field and the left lower field.  Abdominal: Soft. Normal appearance and bowel sounds are normal. There is no tenderness.  Neurological: She is alert.  Skin: Skin is warm, dry and  intact. No rash noted.  Psychiatric: Her speech is normal. Judgment and thought content normal. Her mood appears not anxious. She is withdrawn. Cognition and memory are normal. She exhibits a depressed mood.  Tearful           Assessment & Plan:

## 2017-12-08 NOTE — Assessment & Plan Note (Signed)
Progressive. Followed by pulmonary.  on O2 high L nasal cannula continuous.

## 2017-12-23 ENCOUNTER — Telehealth: Payer: Self-pay

## 2017-12-23 NOTE — Telephone Encounter (Signed)
Copied from [place] [id number]. Topic: General - Other >> Dec 23, 2017 10:16 AM Oneta Rack wrote: Relation to pt: self Call back number: [phone] Pharmacy: [place], [place] - [place] [phone] ([phone] (Fax)   Reason for call:  patient states PCP advised to keep her informed regarding traZODone (DESYREL) 50 MG tablet, medication is not working, patient unable to sleep and would like to try an alternate,please advise >> Dec 23, 2017 10:20 AM Oneta Rack wrote: Relation to pt: self Call back number: [phone] Pharmacy: [place], [place] - [place] [place] [phone] (Fax)   Reason for call:  patient states PCP advised to keep her informed regarding traZODone (DESYREL) 50 MG tablet, medication is not working, patient unable to sleep and would like to try an alternate,please advise

## 2017-12-24 NOTE — Telephone Encounter (Signed)
Kaitlin Hamilton notified as instructed by telephone.  Patient states understanding.

## 2017-12-24 NOTE — Telephone Encounter (Signed)
If no side effects to trazodone.. Try increase to 100 mg at bedtime.

## 2017-12-28 ENCOUNTER — Ambulatory Visit (INDEPENDENT_AMBULATORY_CARE_PROVIDER_SITE_OTHER): Payer: Medicare Other | Admitting: Family Medicine

## 2017-12-28 ENCOUNTER — Encounter: Payer: Self-pay | Admitting: Family Medicine

## 2017-12-28 ENCOUNTER — Telehealth: Payer: Self-pay | Admitting: *Deleted

## 2017-12-28 VITALS — BP 122/76 | HR 73 | Temp 97.8°F | Ht 66.0 in | Wt 263.2 lb

## 2017-12-28 DIAGNOSIS — R3915 Urgency of urination: Secondary | ICD-10-CM

## 2017-12-28 DIAGNOSIS — M549 Dorsalgia, unspecified: Secondary | ICD-10-CM

## 2017-12-28 LAB — POC URINALSYSI DIPSTICK (AUTOMATED)
Bilirubin, UA: NEGATIVE
GLUCOSE UA: NEGATIVE
Ketones, UA: NEGATIVE
NITRITE UA: NEGATIVE
Protein, UA: NEGATIVE
RBC UA: NEGATIVE
SPEC GRAV UA: 1.025 (ref 1.010–1.025)
UROBILINOGEN UA: 0.2 U/dL
pH, UA: 6 (ref 5.0–8.0)

## 2017-12-28 MED ORDER — METHOCARBAMOL 500 MG PO TABS: 500.0000 mg | ORAL_TABLET | Freq: Three times a day (TID) | ORAL | 0 refills | Status: DC | PRN

## 2017-12-28 MED ORDER — TIZANIDINE HCL 2 MG PO TABS: 2.0000 mg | ORAL_TABLET | Freq: Two times a day (BID) | ORAL | 0 refills | Status: DC | PRN

## 2017-12-28 NOTE — Assessment & Plan Note (Signed)
Anticipate musculoskeletal cause - very tender to palpation at L thoracic paraspinous mm ?strain - Rx robaxin muscle relaxant. No significant improvement with NSAID trial. With urinary urgency/frequency, check UA/micro - ?infection so UCx sent.  Doubt IUP related. Will be in touch with test results.

## 2017-12-28 NOTE — Telephone Encounter (Signed)
Received fax from [employer] requesting PA for Methocarbamol.  Started PA on CoverMyMeds.  List of preferred medication given on PA and shown to Dr. Danise Mina.  Will change to Tizanidine 2 mg.  Rx sent to [facility] as instructed by Dr. Darnell Level.  Medication list updated.

## 2017-12-28 NOTE — Patient Instructions (Signed)
I think you have more muscular back pain, not convinced there's urine infection. We will send urine culture.  Treat possible muscular back pain with robaxin muscle relaxant (mainly at night time as it can make you sleepy).  Gentle stretching, heating pad or hot shower for the back.

## 2017-12-28 NOTE — Progress Notes (Signed)
BP 122/76 (BP Location: Left Arm, Patient Position: Sitting, Cuff Size: Large)   Pulse 73   Temp 97.8 F (36.6 C) (Oral)   Ht 5\' 6"  (1.676 m)   Wt 263 lb 4 oz (119.4 kg)   SpO2 99% Comment: 4 L, continuous  BMI 42.49 kg/m    CC: back pain Subjective:    Patient ID: Kaitlin Hamilton, female    DOB: 1950-11-21, 67 y.o.   MRN: [id number]  HPI: Kaitlin Hamilton is a 67 y.o. female presenting on 12/28/2017 for Back Pain (Across mid back. Started about 4-5 wks ago, worsening. ) and Medication Problem (Since starting trazadone 3 wks ago, feels itching jumping in bilateral LEs.)   1 mo h/o lower thoracic back pain, progressively worsening. This is associated with urinary frequency and urgency. Did have green bowel movements for a few days, now cleared up. Has been taking tylenol. She took 2 wk course of diclofenac without improvement.   No lower back pain or shooting pain down legs.  Denies fevers/chills, dysuria, hematuria, abd pain, or flank pain or nausea/vomiting.  Denies inciting trauma/injury.  Had UTI 06/2017 - at that time did have dysuria.   UIP on chronic supplemental O2 4-6 L [place] and on cellcept.   Relevant past medical, surgical, family and social history reviewed and updated as indicated. Interim medical history since our last visit reviewed. Allergies and medications reviewed and updated. Outpatient Medications Prior to Visit  Medication Sig Dispense Refill  . acetaminophen (TYLENOL) 500 MG tablet Take 1,000 mg by mouth every 6 (six) hours as needed for moderate pain.    . diphenhydrAMINE (BENADRYL) 25 mg capsule Take 25 mg by mouth as needed.    . ESBRIET 801 MG TABS TAKE 801MG  BY MOUTH 3 TIMES A DAY WITH FOOD 90 tablet 11  . FIBER PO Take 1 tablet by mouth daily.    . fluticasone (FLONASE) 50 MCG/ACT nasal spray Place 1 spray into both nostrils daily.    Marland Kitchen glucose blood (ONE TOUCH ULTRA TEST) test strip CHECK BLOOD SUGAR DAILY. 100 each 3  . levothyroxine (SYNTHROID, LEVOTHROID)  125 MCG tablet TAKE 1 TABLET BY MOUTH  DAILY BEFORE BREAKFAST 90 tablet 3  . losartan-hydrochlorothiazide (HYZAAR) 50-12.5 MG tablet Take 1 tablet by mouth daily. 90 tablet 1  . metFORMIN (GLUCOPHAGE-XR) 500 MG 24 hr tablet Take 2 tablets (1,000 mg total) by mouth daily with breakfast. 60 tablet 11  . montelukast (SINGULAIR) 10 MG tablet Take 1 tablet (10 mg total) by mouth at bedtime. 90 tablet 3  . mycophenolate (CELLCEPT) 500 MG tablet Take 1 tablet (500 mg total) by mouth daily. 90 tablet 3  . NON FORMULARY Place 3 L into the nose daily. 4-5 Liters with exertion    . nystatin cream (MYCOSTATIN) Apply 1 application topically 3 (three) times daily. 30 g 0  . ONGLYZA 5 MG TABS tablet TAKE 1 TABLET BY MOUTH  DAILY 90 tablet 1  . pantoprazole (PROTONIX) 40 MG tablet TAKE 1 TABLET BY MOUTH TWO  TIMES DAILY 180 tablet 1  . pioglitazone (ACTOS) 45 MG tablet Take 1 tablet (45 mg total) by mouth daily. 90 tablet 3  . pravastatin (PRAVACHOL) 40 MG tablet Take 1 tablet (40 mg total) by mouth daily. 90 tablet 2  . traZODone (DESYREL) 50 MG tablet Take 0.5-1 tablets (25-50 mg total) by mouth at bedtime as needed for sleep. 30 tablet 3  . verapamil (CALAN-SR) 180 MG CR tablet TAKE 1 TABLET  BY MOUTH  DAILY 90 tablet 1  . diclofenac (VOLTAREN) 75 MG EC tablet Take 1 tablet by mouth two  times daily (Patient not taking: Reported on 12/28/2017) 180 tablet 0   No facility-administered medications prior to visit.      Per HPI unless specifically indicated in ROS section below Review of Systems     Objective:    BP 122/76 (BP Location: Left Arm, Patient Position: Sitting, Cuff Size: Large)   Pulse 73   Temp 97.8 F (36.6 C) (Oral)   Ht 5\' 6"  (1.676 m)   Wt 263 lb 4 oz (119.4 kg)   SpO2 99% Comment: 4 L, continuous  BMI 42.49 kg/m   Wt Readings from Last 3 Encounters:  12/28/17 263 lb 4 oz (119.4 kg)  12/08/17 262 lb (118.8 kg)  10/14/17 254 lb (115.2 kg)    Physical Exam  Constitutional: She  appears well-developed and well-nourished.  HENT:  Mouth/Throat: Oropharynx is clear and moist. No oropharyngeal exudate.  Cardiovascular: Normal rate, regular rhythm and normal heart sounds.  No murmur heard. Pulmonary/Chest: Effort normal and breath sounds normal. No respiratory distress. She has no wheezes. She has no rales.  Abdominal: Soft. Bowel sounds are normal. She exhibits no distension and no mass. There is tenderness (mild) in the periumbilical area and left lower quadrant. There is no rigidity, no rebound, no guarding and no CVA tenderness. No hernia.  Musculoskeletal: She exhibits no edema.  Discomfort midline lower thoracic spine No pain midline lumbar spine ++ L lower thoracic paraspinous mm tenderness  Nursing note and vitals reviewed.  Results for orders placed or performed in visit on 12/28/17  POCT Urinalysis Dipstick (Automated)  Result Value Ref Range   Color, UA yellow    Clarity, UA clear    Glucose, UA Negative Negative   Bilirubin, UA negative    Ketones, UA negative    Spec Grav, UA 1.025 1.010 - 1.025   Blood, UA negative    pH, UA 6.0 5.0 - 8.0   Protein, UA Negative Negative   Urobilinogen, UA 0.2 0.2 or 1.0 E.U./dL   Nitrite, UA negative    Leukocytes, UA Small (1+) (A) Negative   Lab Results  Component Value Date   CREATININE 0.67 12/04/2017   BUN 11 12/04/2017   NA 140 12/04/2017   K 4.0 12/04/2017   CL 98 12/04/2017   CO2 34 (H) 12/04/2017       Assessment & Plan:   Problem List Items Addressed This Visit    Mid back pain - Primary    Anticipate musculoskeletal cause - very tender to palpation at L thoracic paraspinous mm ?strain - Rx robaxin muscle relaxant. No significant improvement with NSAID trial. With urinary urgency/frequency, check UA/micro - ?infection so UCx sent.  Doubt IUP related. Will be in touch with test results.       Relevant Medications   methocarbamol (ROBAXIN) 500 MG tablet   Other Relevant Orders   POCT  Urinalysis Dipstick (Automated) (Completed)    Other Visit Diagnoses    Urinary urgency       Relevant Orders   Urine Culture   POCT Urinalysis Dipstick (Automated) (Completed)       Meds ordered this encounter  Medications  . methocarbamol (ROBAXIN) 500 MG tablet    Sig: Take 1 tablet (500 mg total) by mouth 3 (three) times daily as needed for muscle spasms (sedation precautions).    Dispense:  30 tablet    Refill:  0   Orders Placed This Encounter  Procedures  . Urine Culture  . POCT Urinalysis Dipstick (Automated)    Follow up plan: Return if symptoms worsen or fail to improve.  Ria Bush, MD

## 2017-12-30 LAB — URINE CULTURE
MICRO NUMBER: 90693608
SPECIMEN QUALITY: ADEQUATE

## 2017-12-31 ENCOUNTER — Other Ambulatory Visit: Payer: Self-pay | Admitting: Family Medicine

## 2017-12-31 MED ORDER — CEPHALEXIN 500 MG PO CAPS: 500.0000 mg | ORAL_CAPSULE | Freq: Two times a day (BID) | ORAL | 0 refills | Status: DC

## 2018-01-02 DIAGNOSIS — J8489 Other specified interstitial pulmonary diseases: Secondary | ICD-10-CM | POA: Diagnosis not present

## 2018-01-14 ENCOUNTER — Encounter: Payer: Self-pay | Admitting: Family Medicine

## 2018-01-14 ENCOUNTER — Ambulatory Visit (INDEPENDENT_AMBULATORY_CARE_PROVIDER_SITE_OTHER): Payer: Medicare Other | Admitting: Family Medicine

## 2018-01-14 DIAGNOSIS — F321 Major depressive disorder, single episode, moderate: Secondary | ICD-10-CM

## 2018-01-14 DIAGNOSIS — F5104 Psychophysiologic insomnia: Secondary | ICD-10-CM | POA: Diagnosis not present

## 2018-01-14 NOTE — Progress Notes (Signed)
   Subjective:    Patient ID: Kaitlin Hamilton, female    DOB: 09-Apr-1951, 67 y.o.   MRN: [id number]  HPI    67 year old female presents for 1 month follow up MDD and insomnia.   At last OV started on trial of trazodone for sleep. She reports  50 mg did not help. Tried to increase to 100 mg  But it made her legs itch.  Recent UTI, Klebsiella.. Now completed keflex. Now back pain is resolved. She does have some issues with  Pain in back when lifting o2 tanks.  She has used muscle relaxant at night but no longer taking since back better.  She is sleeping better, her mood is feeling some better.  She has good family support.is not interested in counselor or any med at this point.  She   Review of Systems  Constitutional: Positive for fatigue.  Respiratory: Positive for shortness of breath.   Neurological: Negative for syncope.  Psychiatric/Behavioral: Negative for confusion and dysphoric mood. The patient is not nervous/anxious.        Objective:   Physical Exam  Constitutional: Vital signs are normal. She appears well-developed and well-nourished. She is cooperative.  Non-toxic appearance. She does not appear ill. No distress.  obese  HENT:  Head: Normocephalic.  Right Ear: Hearing, tympanic membrane, external ear and ear canal normal. Tympanic membrane is not erythematous, not retracted and not bulging.  Left Ear: Hearing, tympanic membrane, external ear and ear canal normal. Tympanic membrane is not erythematous, not retracted and not bulging.  Nose: No mucosal edema or rhinorrhea. Right sinus exhibits no maxillary sinus tenderness and no frontal sinus tenderness. Left sinus exhibits no maxillary sinus tenderness and no frontal sinus tenderness.  Mouth/Throat: Uvula is midline, oropharynx is clear and moist and mucous membranes are normal.  Eyes: Pupils are equal, round, and reactive to light. Conjunctivae, EOM and lids are normal. Lids are everted and swept, no foreign bodies found.    Neck: Trachea normal and normal range of motion. Neck supple. Carotid bruit is not present. No thyroid mass and no thyromegaly present.  Cardiovascular: Normal rate, regular rhythm, S1 normal, S2 normal, normal heart sounds, intact distal pulses and normal pulses. Exam reveals no gallop and no friction rub.  No murmur heard. Pulmonary/Chest: Effort normal. No tachypnea. No respiratory distress. She has decreased breath sounds. She has no wheezes. She has no rhonchi. She has rales in the right lower field and the left lower field.  Abdominal: Soft. Normal appearance and bowel sounds are normal. There is no tenderness.  Neurological: She is alert.  Skin: Skin is warm, dry and intact. No rash noted.  Psychiatric: Her speech is normal. Judgment and thought content normal. Her mood appears not anxious. She is withdrawn. Cognition and memory are normal. She exhibits a depressed mood.  Tearful           Assessment & Plan:

## 2018-01-14 NOTE — Assessment & Plan Note (Signed)
SE to trazodone. IMproved. Encouraged no napping and as much activity as tolerated.

## 2018-01-14 NOTE — Assessment & Plan Note (Signed)
She reports her mood and sleep issues are better now she is doing better with her back and UTI treated. She refuses medication or counseling at this time.

## 2018-01-14 NOTE — Patient Instructions (Signed)
Call if recurrent sleeping issues or mood issues.

## 2018-01-26 ENCOUNTER — Ambulatory Visit (INDEPENDENT_AMBULATORY_CARE_PROVIDER_SITE_OTHER)
Admission: RE | Admit: 2018-01-26 | Discharge: 2018-01-26 | Disposition: A | Discharge status: Home / Self Care | Payer: Medicare Other | Source: Ambulatory Visit | Attending: Pulmonary Disease | Admitting: Pulmonary Disease

## 2018-01-26 ENCOUNTER — Ambulatory Visit (INDEPENDENT_AMBULATORY_CARE_PROVIDER_SITE_OTHER): Payer: Medicare Other | Admitting: *Deleted

## 2018-01-26 ENCOUNTER — Ambulatory Visit: Payer: Medicare Other | Admitting: Pulmonary Disease

## 2018-01-26 ENCOUNTER — Encounter: Payer: Self-pay | Admitting: Pulmonary Disease

## 2018-01-26 VITALS — BP 132/76 | HR 91 | Ht 66.0 in | Wt 265.6 lb

## 2018-01-26 DIAGNOSIS — R0602 Shortness of breath: Secondary | ICD-10-CM | POA: Diagnosis not present

## 2018-01-26 DIAGNOSIS — J849 Interstitial pulmonary disease, unspecified: Secondary | ICD-10-CM | POA: Diagnosis not present

## 2018-01-26 DIAGNOSIS — Z5181 Encounter for therapeutic drug level monitoring: Secondary | ICD-10-CM

## 2018-01-26 DIAGNOSIS — J3089 Other allergic rhinitis: Secondary | ICD-10-CM

## 2018-01-26 DIAGNOSIS — J84112 Idiopathic pulmonary fibrosis: Secondary | ICD-10-CM | POA: Diagnosis not present

## 2018-01-26 DIAGNOSIS — J9611 Chronic respiratory failure with hypoxia: Secondary | ICD-10-CM | POA: Diagnosis not present

## 2018-01-26 DIAGNOSIS — G4733 Obstructive sleep apnea (adult) (pediatric): Secondary | ICD-10-CM

## 2018-01-26 MED ORDER — TIZANIDINE HCL 2 MG PO TABS: 2.0000 mg | ORAL_TABLET | Freq: Two times a day (BID) | ORAL | 0 refills | Status: DC | PRN

## 2018-01-26 NOTE — Progress Notes (Signed)
SIX MIN WALK 01/26/2018 07/10/2017 12/18/2016 11/13/2016 09/19/2016 06/11/2016 05/13/2016  Medications OTC Fiber tab, Flonase 1 spray each nostril, Synthroid 125 MCG, Hyzaar 50/12.5mg , Metformin 1000mg , Cellcept 500mg , Protonix 40mg , Esbriet 801mg - all taken approx 5:15 am.  - FLonase 40mcg, levothyroxine,cellcept 500mg ,nystatin 100000 unit,protonix 40mg ,pirfenidone,saxagliptin 5mg ,calan 180mg   - Patient took all meds on her medication list except ones that stated to take QHS Voltaren 75mg , Flonase, Glipizide 10mg , Synthriod 139mcg, Hyzaar 50-12.5mg , Singulair 10mg , Cellcept 500mg , Nystatin 100000 units/gm, Protonix 40mg , Actos 45mg , Esbriet 801mg , Pravastatin 20mg , Onglyza 5mg , Calan 180mg  - all meds were taken at 0730 -  Supplimental Oxygen during Test? (L/min) No Yes Yes Yes Yes Yes Yes  O2 Flow Rate - 6 6 8 4 3 5   Type - Continuous Continuous Continuous Continuous Continuous Continuous  Laps 6 8 5  - 3 4 -  Partial Lap (in Meters) 0 0 0 - 0 0 -  Baseline BP (sitting) 130/74 130/80 130/72 - 132/80 152/94 -  Baseline Heartrate 77 70 87 - 94 83 -  Baseline Dyspnea (Borg Scale) 0 0 0.5 - 1 0 -  Baseline Fatigue (Borg Scale) 0 0 0 - 4 0 -  Baseline SPO2 99 100 98 - 100 98 -  BP (sitting) 164/68 158/80 136/80 - 132/68 210/76 -  Heartrate 123 110 113 - 116 118 -  Dyspnea (Borg Scale) 9 5 3  - 5 82 -  Fatigue (Borg Scale) 8 5 2  - 9 5 -  SPO2 89 98 92 - 79 5 -  BP (sitting) 146/72 154/64 132/74 - 126/72 154/84 -  Heartrate 110 89 94 - 89 90 -  SPO2 97 100 97 - 99 98 -  Stopped or Paused before Six Minutes Yes Yes Yes - Yes Yes -  Other Symptoms at end of Exercise Pt stopped after 3 laps (2:15 in to walk test) and O2 was at 82% on 6lpm.  O2 was increased to 8lpm, and sats increased to 96%.  Pt rested for 3:32, then began to walk again on 8lpm.  Pt stopped walk test 4 laps later, with 37 seconds left in walk.  Pt was fatigue after 4 laps at 2:57, desat to 85%, pulse 105, placed her on 8L and then she finished  the 6 min walk,  Pt started off walk on 4L pt stopped due to fatigue and dayspnea after lap 2 stats were checked O2 84% 112HR, O2 bumped to 5L pt got back up to 93% 100 HR and walk was continued, pt needed to stop again after lap 3 due to dyspnea stats were checked O2 was 87% 116 HR, bumped up to 6L, 6L got pt up to 94% 117 HR, 6L helped maintained pt above 90% for the rest of walk - pt stopped at 3:72min left on the clock d/t increased SOB and low O2 saturations.  Pt stopped walk early d/t dyspnea. Pt stopped with 2:54 left on the clock. -  Interpretation Dizziness;Leg pain - Leg pain - - Leg pain -  Distance Completed 288 384 240 - 144 192 -  Tech Comments: - I did notice when she sits down her oxygen level drops TA/CMA Pt walked a steady pace with oxygen tank, she was able to complete walk, pt did express dyspnea, and slight chest tightness during walk Pt was started out w/o 02 98% oxygen hr 103. At 185 ft 02 85 hr 131 8 L; patient walked continuous 02 titrated final lap 6 02 91% hr 114 on 8L. pt stopped  at 3:53min left on the clock d/t increased SOB and low O2 saturations. pt was allowed to sit and rest for a few minutes and was then placed back on 4 liters and an ambulatory walk/O2 titration was done to titrate O2. Pt ended up needing 6 liters O2 to maintain her O2 saturations above 88%.  --amg - did not complete lap 3 d/t dyspnea per pt.

## 2018-01-26 NOTE — Progress Notes (Signed)
Subjective:    Patient ID: Kaitlin Hamilton, female    DOB: 08-10-1950, 67 y.o.   MRN: [id number]  Synopsis: Kaitlin Hamilton is a very pleasant 67 year old female who first saw the [facility] pulmonary clinic in March 2014 for evaluation of shortness of breath. She had significant cough, wheezing, and sputum production requiring multiple rounds of antibiotics. Because of crackles on lung exam and an abnormal pulmonary function test she was referred to [place]. We ordered full pulmonary function testing which showed moderate restriction and a depressed DLCO in proportion to her restriction. There is no airflow obstruction and no change with bronchodilator administration. A chest x-ray performed in February 2014 was read as normal.  An open lung biopsy was performed in December 2014 showing UIP.  Because she has had multiple good responses to steroids, we have treated her with cellcept and prednisone.  In January 2015 she had severe insomnia, hallucinations and gastritis on high dose prednisone.  She had a rash to bactrim. Since February 2015 she has been on cellcept and low dose prednisone and dapsone. Because of progression in hypoxemia and lung function testing in 2016 we changed her therapy to anti-fibrotic therapy with Esbriet. Because of a strong concern for an underlying connective tissue disease it was decided that she will be maintained on low-dose CellCept.   HPI Chief Complaint  Patient presents with  . Follow-up   Kaitlin Hamilton says that last month she was hurting a lot.  She says that she was not sleeping much.  She was having some anxiety so she went to her PCP.  She was told she had some muscle spasms.  A urine test showed a UTI.  She was treated with antibiotics.  She was having a lot of pain in her upper back.  She thinks that the pain has improved.    She says that her breathing does "pretty good".  She describes her degree of limitation with cleaning, shopping as about the same as before.    She  says that her cough is still present, not necessarily worse than before.  She says if she eats and talks she will get cough.    She is still taking esbriet and mycophenolate and is compliant with CPAP.    Past Medical History:  Diagnosis Date  . Allergic rhinitis    uses Flonase daily  . Anesthesia complication    Per pt/ past endoscopy in 2015, the anesthetic spray in back throat caused her to have breathing problems  . Anxiety   . Bronchitis   . Chicken pox   . Constipation    takes Fiber daily  . Depression   . Diverticulosis   . Dysphagia   . GERD (gastroesophageal reflux disease)    takes Protonix daily  . H/O hiatal hernia   . Hemorrhoids   . History of bronchitis    2013  . History of colon polyps   . History of kidney stones   . Hyperlipidemia    lost 43 pounds and no meds required at present  . Hypertension    takes Verapamil and HCTZ daily  . Hypokalemia   . Hypothyroidism (acquired)    takes Synthroid daily  . Insomnia    doesn't take any meds for this  . Joint swelling    left thumb  . Migraines    last one about a month ago  . Non-insulin dependent type 2 diabetes mellitus ([place])    takes Glipizide daily  . OA (osteoarthritis)  left knee  . Oxygen deficiency   . Oxygen dependent    Pt is on continuous O2 at 3 liters!  . Pneumonia    last time in 2006  . PONV (postoperative nausea and vomiting)   . Shortness of breath    with exertion;takes Singulair daily as well as Flonase  . Urinary frequency         Review of Systems  Constitutional: Negative for activity change, chills, fatigue and fever.  HENT: Negative for congestion, ear pain, nosebleeds, postnasal drip, rhinorrhea and sinus pressure.   Respiratory: Positive for shortness of breath. Negative for cough and wheezing.   Cardiovascular: Negative for chest pain, palpitations and leg swelling.  Gastrointestinal: Negative for abdominal pain and nausea.       Objective:   Physical  Exam  Vitals:   01/26/18 0934  BP: 132/76  Pulse: 91  SpO2: 98%  Weight: 265 lb 9.6 oz (120.5 kg)  Height: '5\' 6"'  (1.676 m)  4L Kaitlin Hamilton  Gen: well appearing HENT: OP clear, TM's clear, neck supple PULM: Crackles bases to 1/2 way up B, normal percussion CV: RRR, no mgr, trace edema GI: BS+, soft, nontender Derm: no cyanosis or rash Psyche: normal mood and affect   February 2014 chest x-ray at [facility] normal   11/2012 CT chest >> ([facility] read) centrilobular nodules, interlobular septal thickening worse in bases and periphery R lung > L; some GGO in bases and periphery as well, some bronchiectasis in the bases R > L; findings suggestive of fibrosis but not UIP; question hypersensitivity pneumonitis given centrilobular nodules; also question aspiration  03/2013 ANA, ANCA, Anti-Jo-1, ESR, RF, SCL-70, anti-centromere, SSA/SSB, all negative; CRP 0.6;  04/2013 Barium swallow> abnormal esophageal motility, GERD, hiatal hernia  04/2013 CT chest ([name])> findings suggestive of but not diagnostic of NSIP, small pulmonary nodule  05/17/2013 3 month prednisone trial 06/2013 open lung biopsy> UIP   February 2016 CT chest high resolution> slight progression in reticular abnormalities consistent with pulmonary fibrosis, uip  6 minute walk 10/19/2012 walked 500 feet in office on room air oxygenation did not drop below 90% 04/2013 6MW RA > 1100 feet, HR peak 109, O2 sat Nadir 87% 11/2013 6MW 1364 feet, O2 saturation nadir 78% 08/21/14 6MW 1400 feet 90% on 8L 11/2016 6MW 288M needed [facility] July 2019 6MW 336 M required 8 L nasal cannula, O2 saturation dropped to 89%,  PFT February 2014 simple spirometry performed by her primary care physician>> ratio 90%, FEV1 1.71 L (64% predicted, FVC 1.83 L 55% predicted; flow volume loop is not consistent with obstruction 11/15/2012 Full PFT [name]> Ratio 87%, FEV1 2.00L > 2.07 with bronchodilator (3% change); TLC 3.44 L (65% pred), ERV 0.62 (57% pred), DLCO 15.1  ml/mmHg/min (59% pred) 04/2013 Full PFT> Ratio 93%, FEV1 1.92 L (71% pred), TLC 3.02L (56% pred), DLCO 15.94 (59% pred) May 2015 full pulmonary function test ratio 93%, FEV1 1.71 L (64% predicted, no change with bronchodilator), total lung capacity 2.67 L (49% predicted), DLCO 13.11 (40% predicted) 03/2015 PFT > Ratio 93%, FEV1 1.24L, FVC 1.33L (38% pred), TLC 2.56L (47% pred), DLCO 12.10 (44% pred) March 2017 pulmonary function testing ratio 93%, FVC (41% predicted), total lung capacity 2.34 L (43% protected), DLCO 10.53 (39% protected). November 2017 pulmonary function testing ratio 92%, FVC 1.26 L, 37% predicted, total lung capacity 2.37 L 44% predicted, DLCO 5. 89 21% percent predicted December 2018 lung function test FVC 1.14 L, 33% predicted, total lung capacity 2.67 L 49%  predicted, DLCO 9.02 mL 33% predicted  Most recent labs reviewed, October 2015 showing normal kidney and liver function per      Assessment & Plan:  UIP (usual interstitial pneumonitis) ([place]) - Plan: DG Chest 2 View, Pulmonary function test  ILD (interstitial lung disease) ([place]) - Plan: Pulmonary function test  Other allergic rhinitis  Therapeutic drug monitoring  Chronic hypoxemic respiratory failure (HCC)  OSA (obstructive sleep apnea)  Discussion: This has been a stable interval for [facility].  Today's 6-minute walk distance was actually further than last year.  Her oxygen needs have been about the same that we had to remind her today to use 8 L nasal cannula when walking.  At this time I see no evidence of toxicity from the Esbriet or her CellCept.  She describes back pain which I reassured her today is unlikely to be related to her lung problem that we will check a chest x-ray to make sure there is nothing else going on that we are missing.  Plan: Interstitial lung disease/UIP associated with autoimmune features: Continue Esbriet 3 pills 3 times a day Continue CellCept daily as you are doing We will check a  comprehensive metabolic panel and CBC when you return to see [place] in November We will check a lung function test when you return in November CXR today to make sure the back pain is not related to your lungs  Chronic respiratory failure with hypoxemia: Keep using 4 L of oxygen at rest, 8 L with exertion  Allergic rhinitis: Continue Singulair Continue Flonase In the spring 2020 we will consider using Dymista  Obstructive sleep apnea: Keep using CPAP as you are doing  We will see you back in November of this year or sooner if needed   Current Outpatient Medications:  .  acetaminophen (TYLENOL) 500 MG tablet, Take 1,000 mg by mouth every 6 (six) hours as needed for moderate pain., Disp: , Rfl:  .  diphenhydrAMINE (BENADRYL) 25 mg capsule, Take 25 mg by mouth as needed., Disp: , Rfl:  .  ESBRIET 801 MG TABS, TAKE 801MG BY MOUTH 3 TIMES A DAY WITH FOOD, Disp: 90 tablet, Rfl: 11 .  FIBER PO, Take 1 tablet by mouth daily., Disp: , Rfl:  .  fluticasone (FLONASE) 50 MCG/ACT nasal spray, Place 1 spray into both nostrils daily., Disp: , Rfl:  .  glucose blood (ONE TOUCH ULTRA TEST) test strip, CHECK BLOOD SUGAR DAILY., Disp: 100 each, Rfl: 3 .  levothyroxine (SYNTHROID, LEVOTHROID) 125 MCG tablet, TAKE 1 TABLET BY MOUTH  DAILY BEFORE BREAKFAST, Disp: 90 tablet, Rfl: 3 .  losartan-hydrochlorothiazide (HYZAAR) 50-12.5 MG tablet, Take 1 tablet by mouth daily., Disp: 90 tablet, Rfl: 1 .  metFORMIN (GLUCOPHAGE-XR) 500 MG 24 hr tablet, Take 2 tablets (1,000 mg total) by mouth daily with breakfast., Disp: 60 tablet, Rfl: 11 .  montelukast (SINGULAIR) 10 MG tablet, Take 1 tablet (10 mg total) by mouth at bedtime., Disp: 90 tablet, Rfl: 3 .  mycophenolate (CELLCEPT) 500 MG tablet, Take 1 tablet (500 mg total) by mouth daily., Disp: 90 tablet, Rfl: 3 .  NON FORMULARY, Place 3 L into the nose daily. 4-5 Liters with exertion, Disp: , Rfl:  .  nystatin cream (MYCOSTATIN), Apply 1 application topically 3 (three)  times daily., Disp: 30 g, Rfl: 0 .  ONGLYZA 5 MG TABS tablet, TAKE 1 TABLET BY MOUTH  DAILY, Disp: 90 tablet, Rfl: 1 .  pantoprazole (PROTONIX) 40 MG tablet, TAKE 1 TABLET BY MOUTH  TWO  TIMES DAILY, Disp: 180 tablet, Rfl: 1 .  pioglitazone (ACTOS) 45 MG tablet, Take 1 tablet (45 mg total) by mouth daily., Disp: 90 tablet, Rfl: 3 .  pravastatin (PRAVACHOL) 40 MG tablet, Take 1 tablet (40 mg total) by mouth daily., Disp: 90 tablet, Rfl: 2 .  tiZANidine (ZANAFLEX) 2 MG tablet, Take 1 tablet (2 mg total) by mouth 2 (two) times daily as needed for muscle spasms., Disp: 30 tablet, Rfl: 0 .  verapamil (CALAN-SR) 180 MG CR tablet, TAKE 1 TABLET BY MOUTH  DAILY, Disp: 90 tablet, Rfl: 1

## 2018-01-26 NOTE — Patient Instructions (Signed)
Interstitial lung disease/UIP associated with autoimmune features: Continue Esbriet 3 pills 3 times a day Continue CellCept daily as you are doing We will check a comprehensive metabolic panel and CBC when you return to see [place] in November We will check a lung function test when you return in November CXR today to make sure the back pain is not related to your lungs  Chronic respiratory failure with hypoxemia: Keep using 4 L of oxygen at rest, 8 L with exertion  Allergic rhinitis: Continue Singulair Continue Flonase In the spring 2020 we will consider using Dymista  Obstructive sleep apnea: Keep using CPAP as you are doing  We will see you back in November of this year or sooner if needed

## 2018-02-01 DIAGNOSIS — J8489 Other specified interstitial pulmonary diseases: Secondary | ICD-10-CM | POA: Diagnosis not present

## 2018-02-09 LAB — HM DIABETES EYE EXAM

## 2018-02-18 ENCOUNTER — Other Ambulatory Visit: Payer: Self-pay | Admitting: Family Medicine

## 2018-02-23 ENCOUNTER — Encounter: Payer: Self-pay | Admitting: Family Medicine

## 2018-03-04 DIAGNOSIS — J8489 Other specified interstitial pulmonary diseases: Secondary | ICD-10-CM | POA: Diagnosis not present

## 2018-03-17 ENCOUNTER — Other Ambulatory Visit: Payer: Self-pay | Admitting: Family Medicine

## 2018-03-17 DIAGNOSIS — Z1231 Encounter for screening mammogram for malignant neoplasm of breast: Secondary | ICD-10-CM

## 2018-03-18 ENCOUNTER — Other Ambulatory Visit: Payer: Self-pay | Admitting: Family Medicine

## 2018-03-26 ENCOUNTER — Other Ambulatory Visit: Payer: Self-pay | Admitting: Family Medicine

## 2018-03-31 ENCOUNTER — Telehealth: Payer: Self-pay | Admitting: Pulmonary Disease

## 2018-03-31 ENCOUNTER — Encounter: Payer: Self-pay | Admitting: Nurse Practitioner

## 2018-03-31 ENCOUNTER — Ambulatory Visit: Payer: Medicare Other | Admitting: Nurse Practitioner

## 2018-03-31 VITALS — BP 140/70 | HR 83 | Temp 97.6°F | Ht 66.0 in | Wt 259.6 lb

## 2018-03-31 DIAGNOSIS — J9611 Chronic respiratory failure with hypoxia: Secondary | ICD-10-CM | POA: Diagnosis not present

## 2018-03-31 DIAGNOSIS — J0100 Acute maxillary sinusitis, unspecified: Secondary | ICD-10-CM

## 2018-03-31 DIAGNOSIS — H6691 Otitis media, unspecified, right ear: Secondary | ICD-10-CM | POA: Diagnosis not present

## 2018-03-31 MED ORDER — AMOXICILLIN-POT CLAVULANATE 875-125 MG PO TABS: 1.0000 | ORAL_TABLET | Freq: Two times a day (BID) | ORAL | 0 refills | Status: AC

## 2018-03-31 MED ORDER — PREDNISONE 10 MG PO TABS: ORAL_TABLET | ORAL | 0 refills | Status: DC

## 2018-03-31 MED ORDER — BENZONATATE 100 MG PO CAPS: 100.0000 mg | ORAL_CAPSULE | Freq: Three times a day (TID) | ORAL | 1 refills | Status: AC

## 2018-03-31 NOTE — Assessment & Plan Note (Signed)
Patient Instructions  Will order Augmentin Will order prednisone taper Will order tessalon pearls for cough Continue mucinex Continue current medications Please follow up with Dr. [name] in 1 month Please call the office if symptoms worsen

## 2018-03-31 NOTE — Patient Instructions (Signed)
Will order Augmentin Will order prednisone taper Will order tessalon pearls for cough Continue mucinex Continue current medications Please follow up with Dr. [name] in 1 month Please call the office if symptoms worsen

## 2018-03-31 NOTE — Telephone Encounter (Signed)
Called and spoke with patient, she has now requested an appointment. Appointment made nothing further needed.

## 2018-03-31 NOTE — Progress Notes (Signed)
_0  ID: Kaitlin Hamilton, female    DOB: May 18, 1951, 67 y.o.   MRN: [id number]  Chief Complaint  Patient presents with  . Cough    with thick green congestion    Referring provider: [name], MD  HPI  67 year old female with UIP, OSA, chronic hypoxemic respiratory failure and allergic rhinitis followed by Dr. [name]  Health history includes HTN, GERD, diabetes, hypothyroidism.   Tests:  February 2014 chest x-ray at [facility] normal   11/2012 CT chest >> ([facility] read) centrilobular nodules, interlobular septal thickening worse in bases and periphery R lung > L; some GGO in bases and periphery as well, some bronchiectasis in the bases R > L; findings suggestive of fibrosis but not UIP; question hypersensitivity pneumonitis given centrilobular nodules; also question aspiration  03/2013 ANA, ANCA, Anti-Jo-1, ESR, RF, SCL-70, anti-centromere, SSA/SSB, all negative; CRP 0.6;  04/2013 Barium swallow> abnormal esophageal motility, GERD, hiatal hernia  04/2013 CT chest ([name])> findings suggestive of but not diagnostic of NSIP, small pulmonary nodule  05/17/2013 3 month prednisone trial 06/2013 open lung biopsy> UIP   February 2016 CT chest high resolution> slight progression in reticular abnormalities consistent with pulmonary fibrosis, uip  6 minute walk 10/19/2012 walked 500 feet in office on room air oxygenation did not drop below 90% 04/2013 6MW RA > 1100 feet, HR peak 109, O2 sat Nadir 87% 11/2013 6MW 1364 feet, O2 saturation nadir 78% 08/21/14 6MW 1400 feet 90% on 8L 11/2016 6MW 288M needed [facility] July 2019 6MW 336 M required 8 L nasal cannula, O2 saturation dropped to 89%,  PFT February 2014 simple spirometry performed by her primary care physician>> ratio 90%, FEV1 1.71 L (64% predicted, FVC 1.83 L 55% predicted; flow volume loop is not consistent with obstruction 11/15/2012 Full PFT [name]> Ratio 87%, FEV1 2.00L > 2.07 with bronchodilator (3% change); TLC 3.44 L  (65% pred), ERV 0.62 (57% pred), DLCO 15.1 ml/mmHg/min (59% pred) 04/2013 Full PFT> Ratio 93%, FEV1 1.92 L (71% pred), TLC 3.02L (56% pred), DLCO 15.94 (59% pred) May 2015 full pulmonary function test ratio 93%, FEV1 1.71 L (64% predicted, no change with bronchodilator), total lung capacity 2.67 L (49% predicted), DLCO 13.11 (40% predicted) 03/2015 PFT > Ratio 93%, FEV1 1.24L, FVC 1.33L (38% pred), TLC 2.56L (47% pred), DLCO 12.10 (44% pred) March 2017 pulmonary function testing ratio 93%, FVC (41% predicted), total lung capacity 2.34 L (43% protected), DLCO 10.53 (39% protected). November 2017 pulmonary function testing ratio 92%, FVC 1.26 L, 37% predicted, total lung capacity 2.37 L 44% predicted, DLCO 5. 89 21% percent predicted December 2018 lung function test FVC 1.14 L, 33% predicted, total lung capacity 2.67 L 49% predicted, DLCO 9.02 mL 33% predicted  OV 03/21/18 - Acute cough and congestion Patient presents today with complaints of ear pain, sinus pain and pressure, sinus congestion, cough with green sputum. She states that symptoms started over 1 week ago. She states that he had a low grade at first, but is afebrile now. She has taken mucinex DM, but states that symptoms have progressively worsened. She denies any fever, chest pain, or edema.    Allergies  Allergen Reactions  . Adhesive [Tape]     Redness, bruising with any adhesives- bandaids, patches, etc.   . Atovaquone Itching  . Codeine Hives and Swelling  . Demerol [Meperidine] Hives and Swelling  . Hydrocodone Nausea Only  . Trazodone And Nefazodone Itching  . Sulfa Antibiotics Rash    Immunization History  Administered Date(s) Administered  . Influenza Split 05/02/2005, 11/28/2014  . Influenza Whole 04/20/2012  . Influenza,inj,Quad PF,6+ Mos 05/17/2013, 04/04/2014, 10/06/2014, 05/11/2015, 10/19/2015, 03/12/2016, 04/30/2017, 10/14/2017  . Influenza-Unspecified 04/20/2013  . Pneumococcal Conjugate-13 04/04/2014  .  Pneumococcal Polysaccharide-23 04/20/2013  . Tdap 07/21/2009    Past Medical History:  Diagnosis Date  . Allergic rhinitis    uses Flonase daily  . Anesthesia complication    Per pt/ past endoscopy in 2015, the anesthetic spray in back throat caused her to have breathing problems  . Anxiety   . Bronchitis   . Chicken pox   . Constipation    takes Fiber daily  . Depression   . Diverticulosis   . Dysphagia   . GERD (gastroesophageal reflux disease)    takes Protonix daily  . H/O hiatal hernia   . Hemorrhoids   . History of bronchitis    2013  . History of colon polyps   . History of kidney stones   . Hyperlipidemia    lost 43 pounds and no meds required at present  . Hypertension    takes Verapamil and HCTZ daily  . Hypokalemia   . Hypothyroidism (acquired)    takes Synthroid daily  . Insomnia    doesn't take any meds for this  . Joint swelling    left thumb  . Migraines    last one about a month ago  . Non-insulin dependent type 2 diabetes mellitus ([place])    takes Glipizide daily  . OA (osteoarthritis)    left knee  . Oxygen deficiency   . Oxygen dependent    Pt is on continuous O2 at 3 liters!  . Pneumonia    last time in 2006  . PONV (postoperative nausea and vomiting)   . Shortness of breath    with exertion;takes Singulair daily as well as Flonase  . Urinary frequency     Tobacco History: Social History   Tobacco Use  Smoking Status Never Smoker  Smokeless Tobacco Never Used   Counseling given: Not Answered   Outpatient Encounter Medications as of 03/31/2018  Medication Sig  . acetaminophen (TYLENOL) 500 MG tablet Take 1,000 mg by mouth every 6 (six) hours as needed for moderate pain.  . diphenhydrAMINE (BENADRYL) 25 mg capsule Take 25 mg by mouth as needed.  . ESBRIET 801 MG TABS TAKE 801MG BY MOUTH 3 TIMES A DAY WITH FOOD  . FIBER PO Take 1 tablet by mouth daily.  . fluticasone (FLONASE) 50 MCG/ACT nasal spray Place 1 spray into both nostrils  daily.  Marland Kitchen glucose blood (ONE TOUCH ULTRA TEST) test strip CHECK BLOOD SUGAR DAILY.  Marland Kitchen levothyroxine (SYNTHROID, LEVOTHROID) 125 MCG tablet TAKE 1 TABLET BY MOUTH  DAILY BEFORE BREAKFAST  . losartan-hydrochlorothiazide (HYZAAR) 50-12.5 MG tablet Take 1 tablet by mouth daily.  . metFORMIN (GLUCOPHAGE-XR) 500 MG 24 hr tablet Take 2 tablets (1,000 mg total) by mouth daily with breakfast.  . montelukast (SINGULAIR) 10 MG tablet Take 1 tablet (10 mg total) by mouth at bedtime.  . mycophenolate (CELLCEPT) 500 MG tablet Take 1 tablet (500 mg total) by mouth daily.  . NON FORMULARY Place 3 L into the nose daily. 4-5 Liters with exertion  . nystatin cream (MYCOSTATIN) Apply 1 application topically 3 (three) times daily.  . ONGLYZA 5 MG TABS tablet TAKE 1 TABLET BY MOUTH  DAILY  . pantoprazole (PROTONIX) 40 MG tablet TAKE 1 TABLET BY MOUTH TWO  TIMES DAILY  . pioglitazone (ACTOS) 45  MG tablet Take 1 tablet (45 mg total) by mouth daily.  . pravastatin (PRAVACHOL) 40 MG tablet Take 1 tablet (40 mg total) by mouth daily.  Marland Kitchen tiZANidine (ZANAFLEX) 2 MG tablet Take 1 tablet (2 mg total) by mouth 2 (two) times daily as needed for muscle spasms.  . verapamil (CALAN-SR) 180 MG CR tablet TAKE 1 TABLET BY MOUTH  DAILY  . amoxicillin-clavulanate (AUGMENTIN) 875-125 MG tablet Take 1 tablet by mouth 2 (two) times daily for 7 days.  . benzonatate (TESSALON) 100 MG capsule Take 1 capsule (100 mg total) by mouth 3 (three) times daily for 10 days.  . predniSONE (DELTASONE) 10 MG tablet Take 3 tabs for 2 days, then 2 tabs for 2 days, then 1 tab for 2 days, then stop   No facility-administered encounter medications on file as of 03/31/2018.      Review of Systems  Review of Systems  Constitutional: Positive for fever.  HENT: Positive for congestion, ear pain (bilat), postnasal drip, sinus pressure, sinus pain and sore throat.   Respiratory: Positive for cough and shortness of breath. Negative for wheezing.     Cardiovascular: Negative.  Negative for chest pain and leg swelling.  Gastrointestinal: Negative.   Allergic/Immunologic: Negative.   Neurological: Negative.   Psychiatric/Behavioral: Negative.        Physical Exam  BP 140/70 (BP Location: Left Arm, Patient Position: Sitting, Cuff Size: Normal)   Pulse 83   Temp 97.6 F (36.4 C)   Ht 5' 6" (1.676 m)   Wt 259 lb 9.6 oz (117.8 kg)   SpO2 98% Comment: on 4L O2 when sitting  BMI 41.90 kg/m   Wt Readings from Last 5 Encounters:  03/31/18 259 lb 9.6 oz (117.8 kg)  01/26/18 265 lb 9.6 oz (120.5 kg)  01/14/18 261 lb 12 oz (118.7 kg)  12/28/17 263 lb 4 oz (119.4 kg)  12/08/17 262 lb (118.8 kg)     Physical Exam  Constitutional: She is oriented to person, place, and time. She appears well-developed and well-nourished. No distress.  HENT:  Right Ear: Tympanic membrane is erythematous and bulging.  Left Ear: Tympanic membrane is erythematous.  Nose: Rhinorrhea present. Right sinus exhibits maxillary sinus tenderness and frontal sinus tenderness. Left sinus exhibits maxillary sinus tenderness and frontal sinus tenderness.  Cardiovascular: Normal rate and regular rhythm.  Pulmonary/Chest: Effort normal and breath sounds normal.  Neurological: She is alert and oriented to person, place, and time.  Psychiatric: She has a normal mood and affect.  Nursing note and vitals reviewed.     Assessment & Plan:   Right otitis media Patient Instructions  Will order Augmentin Will order prednisone taper Will order tessalon pearls for cough Continue mucinex Continue current medications Please follow up with Dr. [name] in 1 month Please call the office if symptoms worsen    Chronic hypoxemic respiratory failure ([place]) Continue O2  Acute non-recurrent maxillary sinusitis Patient Instructions  Will order Augmentin Will order prednisone taper Will order tessalon pearls for cough Continue mucinex Continue current  medications Please follow up with Dr. [name] in 1 month Please call the office if symptoms worsen       Fenton Foy, NP 04/01/2018

## 2018-04-01 ENCOUNTER — Encounter: Payer: Self-pay | Admitting: Nurse Practitioner

## 2018-04-01 NOTE — Assessment & Plan Note (Signed)
Continue O2 

## 2018-04-01 NOTE — Progress Notes (Signed)
Reviewed, agree 

## 2018-04-01 NOTE — Assessment & Plan Note (Signed)
Patient Instructions  Will order Augmentin Will order prednisone taper Will order tessalon pearls for cough Continue mucinex Continue current medications Please follow up with Dr. [name] in 1 month Please call the office if symptoms worsen

## 2018-04-03 ENCOUNTER — Telehealth: Payer: Self-pay | Admitting: Family Medicine

## 2018-04-03 DIAGNOSIS — E1165 Type 2 diabetes mellitus with hyperglycemia: Secondary | ICD-10-CM

## 2018-04-03 NOTE — Telephone Encounter (Signed)
-----   Message from Lendon Collar, RT sent at 03/29/2018  1:46 PM EDT ----- Regarding: Lab orders for Friday 04/09/18 Please enter 3 month follow up labs for 04/09/18. Thanks!

## 2018-04-04 DIAGNOSIS — J8489 Other specified interstitial pulmonary diseases: Secondary | ICD-10-CM | POA: Diagnosis not present

## 2018-04-09 ENCOUNTER — Other Ambulatory Visit (INDEPENDENT_AMBULATORY_CARE_PROVIDER_SITE_OTHER): Payer: Medicare Other

## 2018-04-09 DIAGNOSIS — E1165 Type 2 diabetes mellitus with hyperglycemia: Secondary | ICD-10-CM

## 2018-04-09 LAB — HEMOGLOBIN A1C: Hgb A1c MFr Bld: 7.6 % — ABNORMAL HIGH (ref 4.6–6.5)

## 2018-04-12 ENCOUNTER — Telehealth: Payer: Self-pay | Admitting: Pulmonary Disease

## 2018-04-12 NOTE — Telephone Encounter (Signed)
Left message for patient's daughter to call back to get scheduled for PFT.

## 2018-04-12 NOTE — Telephone Encounter (Signed)
OK by me 

## 2018-04-12 NOTE — Telephone Encounter (Signed)
Spoke with patient's daughter Donella Stade. She stated that her mother was seen by [place] on 03/31/18 for an acute visit. She was advised to follow up in 1 month so an appt. Has been made with BQ on 04/28/18 at 1045a. When patient was last seen by BQ on 01/26/18, she was advised to have a PFT when she returned.   [name] wants to know if it is ok to have the PFT in October instead of November.   BQ, please advise. Thanks!

## 2018-04-13 NOTE — Telephone Encounter (Signed)
Pt daughter returning call. PFT w/OV sched for 10/9.

## 2018-04-13 NOTE — Telephone Encounter (Signed)
Noted. Will close this encounter.  

## 2018-04-16 ENCOUNTER — Encounter: Payer: Self-pay | Admitting: Family Medicine

## 2018-04-16 ENCOUNTER — Ambulatory Visit: Payer: Medicare Other | Admitting: Family Medicine

## 2018-04-16 VITALS — BP 120/70 | HR 83 | Temp 97.7°F | Ht 66.0 in | Wt 260.5 lb

## 2018-04-16 DIAGNOSIS — E1165 Type 2 diabetes mellitus with hyperglycemia: Secondary | ICD-10-CM | POA: Diagnosis not present

## 2018-04-16 DIAGNOSIS — Z23 Encounter for immunization: Secondary | ICD-10-CM

## 2018-04-16 DIAGNOSIS — E1159 Type 2 diabetes mellitus with other circulatory complications: Secondary | ICD-10-CM

## 2018-04-16 DIAGNOSIS — I10 Essential (primary) hypertension: Secondary | ICD-10-CM

## 2018-04-16 NOTE — Assessment & Plan Note (Addendum)
Tolerable control  metformin to 2000 mg daily., max actos and on onglyza.  re-eval in 3 months.

## 2018-04-16 NOTE — Progress Notes (Signed)
Subjective:    Patient ID: Kaitlin Hamilton, female    DOB: 03-Jun-1951, 67 y.o.   MRN: [id number]  HPI   67 year old female presents for DM follow up.   She has recently been treated for infection in ear and sinus.. Treated with prednisone and antibiotics.  She has started feeling better overall now. Still some pressure, itching, pressure in nose, congestion , clear.  No fever.  Now breathing back to baseline.  Diabetes:   Lab Results  Component Value Date   HGBA1C 7.6 (H) 04/09/2018  Using medications without difficulties: Hypoglycemic episodes: Hyperglycemic episodes: occ > 200 Feet problems:no ulcers Blood Sugars averaging: 135-191 eye exam within last year:   Diet:  Decreased appetite, occ skipping meals  Has stopped sodas Wt Readings from Last 3 Encounters:  04/16/18 260 lb 8 oz (118.2 kg)  03/31/18 259 lb 9.6 oz (117.8 kg)  01/26/18 265 lb 9.6 oz (120.5 kg)    Hypertension:   Excellent control on  Current regimen.  Using medication without problems or lightheadedness:  Chest pain with exertion: Edema: Short of breath: Average home BPs: Other issues:  Blood pressure 120/70, pulse 83, temperature 97.7 F (36.5 C), temperature source Oral, height 5\' 6"  (1.676 m), weight 260 lb 8 oz (118.2 kg), SpO2 98 %.  Review of Systems  Constitutional: Negative for fatigue and fever.  HENT: Negative for congestion.   Eyes: Negative for pain.  Respiratory: Negative for cough and shortness of breath.   Cardiovascular: Negative for chest pain, palpitations and leg swelling.  Gastrointestinal: Negative for abdominal pain.  Genitourinary: Negative for dysuria and vaginal bleeding.  Musculoskeletal: Negative for back pain.  Neurological: Negative for syncope, light-headedness and headaches.  Psychiatric/Behavioral: Negative for dysphoric mood.       Objective:   Physical Exam  Constitutional: Vital signs are normal. She appears well-developed and well-nourished. She is  cooperative.  Non-toxic appearance. She does not appear ill. No distress.  Overweight   HENT:  Head: Normocephalic.  Right Ear: Hearing, external ear and ear canal normal. Tympanic membrane is not erythematous, not retracted and not bulging. A middle ear effusion is present.  Left Ear: Hearing, tympanic membrane, external ear and ear canal normal. Tympanic membrane is not erythematous, not retracted and not bulging.  Nose: No mucosal edema or rhinorrhea. Right sinus exhibits no maxillary sinus tenderness and no frontal sinus tenderness. Left sinus exhibits no maxillary sinus tenderness and no frontal sinus tenderness.  Mouth/Throat: Uvula is midline, oropharynx is clear and moist and mucous membranes are normal.  Eyes: Pupils are equal, round, and reactive to light. Conjunctivae, EOM and lids are normal. Lids are everted and swept, no foreign bodies found.  Neck: Trachea normal and normal range of motion. Neck supple. Carotid bruit is not present. No thyroid mass and no thyromegaly present.  Cardiovascular: Normal rate, regular rhythm, S1 normal, S2 normal, normal heart sounds, intact distal pulses and normal pulses. Exam reveals no gallop and no friction rub.  No murmur heard. Pulmonary/Chest: Effort normal. No tachypnea. No respiratory distress. She has no decreased breath sounds. She has no wheezes. She has no rhonchi. She has rales in the right lower field and the left lower field.  On continuous oxygen  Abdominal: Soft. Normal appearance and bowel sounds are normal. There is no tenderness.  Neurological: She is alert.  Skin: Skin is warm, dry and intact. No rash noted.  Psychiatric: Her speech is normal and behavior is normal. Judgment and thought  content normal. Her mood appears not anxious. Cognition and memory are normal. She does not exhibit a depressed mood.     Diabetic foot exam: Normal inspection No skin breakdown No calluses  Normal DP pulses Normal sensation to light touch  and monofilament Nails normal      Assessment & Plan:

## 2018-04-16 NOTE — Patient Instructions (Addendum)
Try not to skip meals.  Keep up with water intake. Please moved AMW and CPX back to 3 months from 04/09/2018.

## 2018-04-16 NOTE — Addendum Note (Signed)
Addended by: Carter Kitten on: 04/16/2018 11:48 AM   Modules accepted: Orders

## 2018-04-28 ENCOUNTER — Encounter: Payer: Self-pay | Admitting: Pulmonary Disease

## 2018-04-28 ENCOUNTER — Other Ambulatory Visit (INDEPENDENT_AMBULATORY_CARE_PROVIDER_SITE_OTHER): Payer: Medicare Other

## 2018-04-28 ENCOUNTER — Ambulatory Visit: Payer: Medicare Other | Admitting: Pulmonary Disease

## 2018-04-28 ENCOUNTER — Ambulatory Visit (INDEPENDENT_AMBULATORY_CARE_PROVIDER_SITE_OTHER): Payer: Medicare Other | Admitting: Pulmonary Disease

## 2018-04-28 VITALS — BP 140/76 | HR 86 | Ht 64.25 in | Wt 262.0 lb

## 2018-04-28 DIAGNOSIS — G4733 Obstructive sleep apnea (adult) (pediatric): Secondary | ICD-10-CM

## 2018-04-28 DIAGNOSIS — J84112 Idiopathic pulmonary fibrosis: Secondary | ICD-10-CM

## 2018-04-28 DIAGNOSIS — J849 Interstitial pulmonary disease, unspecified: Secondary | ICD-10-CM

## 2018-04-28 DIAGNOSIS — J0100 Acute maxillary sinusitis, unspecified: Secondary | ICD-10-CM | POA: Diagnosis not present

## 2018-04-28 DIAGNOSIS — J9611 Chronic respiratory failure with hypoxia: Secondary | ICD-10-CM

## 2018-04-28 LAB — PULMONARY FUNCTION TEST
DL/VA % PRED: 96 %
DL/VA: 4.67 ml/min/mmHg/L
DLCO UNC % PRED: 36 %
DLCO UNC: 8.88 ml/min/mmHg
FEF 25-75 POST: 1.63 L/s
FEF 25-75 Pre: 1.97 L/sec
FEF2575-%Change-Post: -17 %
FEF2575-%Pred-Post: 80 %
FEF2575-%Pred-Pre: 97 %
FEV1-%CHANGE-POST: 10 %
FEV1-%PRED-PRE: 38 %
FEV1-%Pred-Post: 42 %
FEV1-Post: 1.02 L
FEV1-Pre: 0.92 L
FEV1FVC-%CHANGE-POST: -1 %
FEV1FVC-%Pred-Pre: 123 %
FEV6-%Change-Post: 12 %
FEV6-%PRED-PRE: 33 %
FEV6-%Pred-Post: 37 %
FEV6-POST: 1.1 L
FEV6-PRE: 0.98 L
FEV6FVC-%PRED-PRE: 104 %
FEV6FVC-%Pred-Post: 104 %
FVC-%Change-Post: 12 %
FVC-%PRED-PRE: 31 %
FVC-%Pred-Post: 35 %
FVC-POST: 1.1 L
FVC-PRE: 0.98 L
POST FEV6/FVC RATIO: 100 %
PRE FEV1/FVC RATIO: 94 %
PRE FEV6/FVC RATIO: 100 %
Post FEV1/FVC ratio: 92 %

## 2018-04-28 LAB — SEDIMENTATION RATE: Sed Rate: 58 mm/hr — ABNORMAL HIGH (ref 0–30)

## 2018-04-28 MED ORDER — PREDNISONE 20 MG PO TABS: ORAL_TABLET | ORAL | 0 refills | Status: DC

## 2018-04-28 NOTE — Progress Notes (Signed)
PFT completed today.  

## 2018-04-28 NOTE — Patient Instructions (Signed)
Interstitial lung disease: I am concerned that the inflammatory component, autoimmune component may be making your lungs worse I am going to check blood test today to see if there is an autoimmune cause I would like for you to take prednisone 20 mg daily x1 week, then 10 mg daily for a week Increase CellCept today to 500 mg twice a day Continue taking Esbriet 3 pills 3 times a day On the next visit we will repeat lab work (CBC, comprehensive metabolic panel) and if it is normal we will increase the CellCept to 1 g twice a day  Chronic respiratory failure with hypoxemia: Worsening Continue 4 L of oxygen at rest Use 10 L of oxygen when you exert yourself  Obstructive sleep apnea: Keep taking CPAP at night  Chest congestion/mucus production: Use Mucinex over-the-counter twice a day to help clear mucus out of your chest  Follow-up in 2 weeks, nurse practitioner visit or with me if I have an opening (can use held spot)

## 2018-04-28 NOTE — Progress Notes (Signed)
Subjective:    Patient ID: Kaitlin Hamilton, female    DOB: May 03, 1951, 67 y.o.   MRN: [id number]  Synopsis: Kaitlin Hamilton is a very pleasant 67 year old female who first saw the [facility] pulmonary clinic in March 2014 for evaluation of shortness of breath. She had significant cough, wheezing, and sputum production requiring multiple rounds of antibiotics. Because of crackles on lung exam and an abnormal pulmonary function test she was referred to [place]. We ordered full pulmonary function testing which showed moderate restriction and a depressed DLCO in proportion to her restriction. There is no airflow obstruction and no change with bronchodilator administration. A chest x-ray performed in February 2014 was read as normal.  An open lung biopsy was performed in December 2014 showing UIP.  Because she has had multiple good responses to steroids, we have treated her with cellcept and prednisone.  In January 2015 she had severe insomnia, hallucinations and gastritis on high dose prednisone.  She had a rash to bactrim. Since February 2015 she has been on cellcept and low dose prednisone and dapsone. Because of progression in hypoxemia and lung function testing in 2016 we changed her therapy to anti-fibrotic therapy with Esbriet. Because of a strong concern for an underlying connective tissue disease it was decided that she will be maintained on low-dose CellCept.   HPI Chief Complaint  Patient presents with  . Follow-up    breathing worse with even minimal activity. Uses oxygen all the time.      Kaitlin Hamilton was sick several times recently and for about 2-1/2 weeks she had increased sinus congestion mucus production and shortness of breath.  She says that ever since then she has been having increasing shortness of breath.  She has some chest tightness.  She says that she has a little bit of mucus in the mornings, no sinus symptoms.  With walking in her house she is noticing that her O2 saturation will drop to  the 70s while on 4 L.  However even when using 8 L of oxygen when walking from her car into the home she notices that her O2 saturation will drop as low as the 70s as well.  When she vacuums she has seen her oxygen level dropped as low as 68%.  She has a problem changing the oxygen however because the concentrator is several rooms away.  When she is at rest on 4 L her O2 saturation will be 93 to 94%.  She denies leg swelling, chest pain.  Past Medical History:  Diagnosis Date  . Allergic rhinitis    uses Flonase daily  . Anesthesia complication    Per pt/ past endoscopy in 2015, the anesthetic spray in back throat caused her to have breathing problems  . Anxiety   . Bronchitis   . Chicken pox   . Constipation    takes Fiber daily  . Depression   . Diverticulosis   . Dysphagia   . GERD (gastroesophageal reflux disease)    takes Protonix daily  . H/O hiatal hernia   . Hemorrhoids   . History of bronchitis    2013  . History of colon polyps   . History of kidney stones   . Hyperlipidemia    lost 43 pounds and no meds required at present  . Hypertension    takes Verapamil and HCTZ daily  . Hypokalemia   . Hypothyroidism (acquired)    takes Synthroid daily  . Insomnia    doesn't take any meds for  this  . Joint swelling    left thumb  . Migraines    last one about a month ago  . Non-insulin dependent type 2 diabetes mellitus ([place])    takes Glipizide daily  . OA (osteoarthritis)    left knee  . Oxygen deficiency   . Oxygen dependent    Pt is on continuous O2 at 3 liters!  . Pneumonia    last time in 2006  . PONV (postoperative nausea and vomiting)   . Shortness of breath    with exertion;takes Singulair daily as well as Flonase  . Urinary frequency         Review of Systems  Constitutional: Negative for activity change, chills, fatigue and fever.  HENT: Negative for congestion, ear pain, nosebleeds, postnasal drip, rhinorrhea and sinus pressure.   Respiratory:  Positive for shortness of breath. Negative for cough and wheezing.   Cardiovascular: Negative for chest pain, palpitations and leg swelling.  Gastrointestinal: Negative for abdominal pain and nausea.       Objective:   Physical Exam  Vitals:   04/28/18 1622  BP: 140/76  Pulse: 86  SpO2: 98%  Weight: 262 lb (118.8 kg)  Height: 5' 4.25" (1.632 m)  4L [place]  04/28/2018 while walking at a brisk pace on 6 L > dropped to 86%; on 10L O2 her O2 saturation maintained at 90%  Gen: obese, chronically ill appearing HENT: OP clear, TM's clear, neck supple PULM: Coarse crackles bases bilaterally B, normal percussion CV: RRR, no mgr, trace edema GI: BS+, soft, nontender Derm: no cyanosis or rash Psyche: normal mood and affect   February 2014 chest x-[facility] at [facility] normal   11/2012 CT chest >> (McQuaid read) centrilobular nodules, interlobular septal thickening worse in bases and periphery R lung > L; some GGO in bases and periphery as well, some bronchiectasis in the bases R > L; findings suggestive of fibrosis but not UIP; question hypersensitivity pneumonitis given centrilobular nodules; also question aspiration  03/2013 ANA, ANCA, Anti-Jo-1, ESR, RF, SCL-70, anti-centromere, SSA/SSB, all negative; CRP 0.6;  04/2013 Barium swallow> abnormal esophageal motility, GERD, hiatal hernia  04/2013 CT chest ([name])> findings suggestive of but not diagnostic of NSIP, small pulmonary nodule  05/17/2013 3 month prednisone trial 06/2013 open lung biopsy> UIP   February 2016 CT chest high resolution> slight progression in reticular abnormalities consistent with pulmonary fibrosis, uip  6 minute walk 10/19/2012 walked 500 feet in office on room air oxygenation did not drop below 90% 04/2013 6MW RA > 1100 feet, HR peak 109, O2 sat Nadir 87% 11/2013 6MW 1364 feet, O2 saturation nadir 78% 08/21/14 6MW 1400 feet 90% on 8L 11/2016 6MW 288M needed [facility] July 2019 6MW 336 M required 8 L nasal cannula, O2  saturation dropped to 89%,  PFT February 2014 simple spirometry performed by her primary care physician>> ratio 90%, FEV1 1.71 L (64% predicted, FVC 1.83 L 55% predicted; flow volume loop is not consistent with obstruction 11/15/2012 Full PFT [name]> Ratio 87%, FEV1 2.00L > 2.07 with bronchodilator (3% change); TLC 3.44 L (65% pred), ERV 0.62 (57% pred), DLCO 15.1 ml/mmHg/min (59% pred) 04/2013 Full PFT> Ratio 93%, FEV1 1.92 L (71% pred), TLC 3.02L (56% pred), DLCO 15.94 (59% pred) May 2015 full pulmonary function test ratio 93%, FEV1 1.71 L (64% predicted, no change with bronchodilator), total lung capacity 2.67 L (49% predicted), DLCO 13.11 (40% predicted) 03/2015 PFT > Ratio 93%, FEV1 1.24L, FVC 1.33L (38% pred), TLC 2.56L (47% pred),  DLCO 12.10 (44% pred) March 2017 pulmonary function testing ratio 93%, FVC (41% predicted), total lung capacity 2.34 L (43% protected), DLCO 10.53 (39% protected). November 2017 pulmonary function testing ratio 92%, FVC 1.26 L, 37% predicted, total lung capacity 2.37 L 44% predicted, DLCO 5. 89 21% percent predicted December 2018 lung function test FVC 1.14 L, 33% predicted, total lung capacity 2.67 L 49% predicted, DLCO 9.02 mL 33% predicted    Most recent records from her visit with our nurse practitioner reviewed where she was diagnosed with an upper respiratory infection and treated with an antibiotic and prednisone.     Assessment & Plan:  IPF (idiopathic pulmonary fibrosis) (HCC) - Plan: Aldolase, Anti-Jo 1 antibody, IgG, ANA, Anti-scleroderma antibody, Centromere Antibodies, C-reactive protein, Hypersensitivity Pneumonitis, Rheumatoid factor, Sedimentation rate, Sjogrens syndrome-A extractable nuclear antibody, Sjogrens syndrome-B extractable nuclear antibody  Acute non-recurrent maxillary sinusitis  Chronic hypoxemic respiratory failure (HCC)  OSA (obstructive sleep apnea)  Discussion: I am concerned about [place].  Ever since her most recent  infection she has been feeling more short of breath and her oxygen level has been dropping.  Lung function testing shows a slight decrease today compared to prior.  I am concerned that the inflammatory component of her interstitial lung disease may be worse because of her worsening symptoms, physical exam, and objective abnormalities (oxygenation).  Her severe chronic respiratory failure is worsening.  Plan: Interstitial lung disease: I am concerned that the inflammatory component, autoimmune component may be making your lungs worse I am going to check blood test today to see if there is an abnormality I would like for you to take prednisone 20 mg daily x1 week, then 10 mg daily for a week Increase CellCept today to 500 mg twice a day Continue taking Esbriet 3 pills 3 times a day On the next visit we will repeat lab work (CBC, comprehensive metabolic panel) and if it is normal we will increase the CellCept to 1 g twice a day  Chronic respiratory failure with hypoxemia: Worsening Continue 4 L of oxygen at rest Use 10 L of oxygen when you exert yourself  Obstructive sleep apnea: Keep taking CPAP at night  Chest congestion/mucus production: Use Mucinex over-the-counter twice a day to help clear mucus out of your chest  Follow-up in 2 weeks, nurse practitioner visit or with me if I have an opening (can use held spot)   Current Outpatient Medications:  .  acetaminophen (TYLENOL) 500 MG tablet, Take 1,000 mg by mouth every 6 (six) hours as needed for moderate pain., Disp: , Rfl:  .  diphenhydrAMINE (BENADRYL) 25 mg capsule, Take 25 mg by mouth as needed., Disp: , Rfl:  .  ESBRIET 801 MG TABS, TAKE 801MG BY MOUTH 3 TIMES A DAY WITH FOOD, Disp: 90 tablet, Rfl: 11 .  fluticasone (FLONASE) 50 MCG/ACT nasal spray, Place 1 spray into both nostrils daily., Disp: , Rfl:  .  glucose blood (ONE TOUCH ULTRA TEST) test strip, CHECK BLOOD SUGAR DAILY., Disp: 100 each, Rfl: 3 .  levothyroxine  (SYNTHROID, LEVOTHROID) 125 MCG tablet, TAKE 1 TABLET BY MOUTH  DAILY BEFORE BREAKFAST, Disp: 90 tablet, Rfl: 0 .  losartan-hydrochlorothiazide (HYZAAR) 50-12.5 MG tablet, Take 1 tablet by mouth daily., Disp: 90 tablet, Rfl: 1 .  metFORMIN (GLUCOPHAGE-XR) 500 MG 24 hr tablet, Take 2 tablets (1,000 mg total) by mouth daily with breakfast., Disp: 60 tablet, Rfl: 11 .  montelukast (SINGULAIR) 10 MG tablet, Take 1 tablet (10 mg total) by mouth at  bedtime., Disp: 90 tablet, Rfl: 3 .  mycophenolate (CELLCEPT) 500 MG tablet, Take 1 tablet (500 mg total) by mouth daily., Disp: 90 tablet, Rfl: 3 .  NON FORMULARY, Place 4 L into the nose daily. 6-8 Liters with exertion, Disp: , Rfl:  .  nystatin cream (MYCOSTATIN), Apply 1 application topically 3 (three) times daily., Disp: 30 g, Rfl: 0 .  ONGLYZA 5 MG TABS tablet, TAKE 1 TABLET BY MOUTH  DAILY, Disp: 90 tablet, Rfl: 1 .  pantoprazole (PROTONIX) 40 MG tablet, TAKE 1 TABLET BY MOUTH TWO  TIMES DAILY, Disp: 180 tablet, Rfl: 1 .  pioglitazone (ACTOS) 45 MG tablet, Take 1 tablet (45 mg total) by mouth daily., Disp: 90 tablet, Rfl: 3 .  pravastatin (PRAVACHOL) 40 MG tablet, Take 1 tablet (40 mg total) by mouth daily., Disp: 90 tablet, Rfl: 2 .  tiZANidine (ZANAFLEX) 2 MG tablet, Take 1 tablet (2 mg total) by mouth 2 (two) times daily as needed for muscle spasms., Disp: 30 tablet, Rfl: 0 .  verapamil (CALAN-SR) 180 MG CR tablet, TAKE 1 TABLET BY MOUTH  DAILY, Disp: 90 tablet, Rfl: 1 .  FIBER PO, Take 1 tablet by mouth daily., Disp: , Rfl:

## 2018-04-29 ENCOUNTER — Ambulatory Visit
Admission: RE | Admit: 2018-04-29 | Discharge: 2018-04-29 | Disposition: A | Discharge status: Home / Self Care | Payer: Medicare Other | Source: Ambulatory Visit | Attending: Family Medicine | Admitting: Family Medicine

## 2018-04-29 DIAGNOSIS — Z1231 Encounter for screening mammogram for malignant neoplasm of breast: Secondary | ICD-10-CM | POA: Diagnosis not present

## 2018-04-29 LAB — C-REACTIVE PROTEIN: CRP: 0.5 mg/dL (ref 0.5–20.0)

## 2018-05-01 LAB — SJOGRENS SYNDROME-A EXTRACTABLE NUCLEAR ANTIBODY: SSA (RO) (ENA) ANTIBODY, IGG: NEGATIVE AI

## 2018-05-01 LAB — SJOGRENS SYNDROME-B EXTRACTABLE NUCLEAR ANTIBODY: SSB (La) (ENA) Antibody, IgG: <1 AI

## 2018-05-01 LAB — RHEUMATOID FACTOR: Rhuematoid fact SerPl-aCnc: <14 IU/mL (ref <14)

## 2018-05-01 LAB — ANA: ANA: NEGATIVE

## 2018-05-01 LAB — ANTI-SCLERODERMA ANTIBODY: Scleroderma (Scl-70) (ENA) Antibody, IgG: <1 AI

## 2018-05-01 LAB — ALDOLASE: ALDOLASE: 4.4 U/L (ref <=8.1)

## 2018-05-03 LAB — HYPERSENSITIVITY PNEUMONITIS
A. Pullulans Abs: NEGATIVE
A.Fumigatus #1 Abs: NEGATIVE
MICROPOLYSPORA FAENI IGG: NEGATIVE
PIGEON SERUM ABS: NEGATIVE
THERMOACT. SACCHARII: NEGATIVE
THERMOACTINOMYCES VULGARIS IGG: NEGATIVE

## 2018-05-03 LAB — ANTI-JO 1 ANTIBODY, IGG: Anti JO-1: <0.2 AI (ref 0.0–0.9)

## 2018-05-04 DIAGNOSIS — J8489 Other specified interstitial pulmonary diseases: Secondary | ICD-10-CM | POA: Diagnosis not present

## 2018-05-12 ENCOUNTER — Ambulatory Visit: Payer: Medicare Other | Admitting: Nurse Practitioner

## 2018-05-12 ENCOUNTER — Encounter: Payer: Self-pay | Admitting: Nurse Practitioner

## 2018-05-12 ENCOUNTER — Other Ambulatory Visit (INDEPENDENT_AMBULATORY_CARE_PROVIDER_SITE_OTHER): Payer: Medicare Other

## 2018-05-12 VITALS — BP 128/74 | HR 70 | Ht 64.25 in | Wt 266.0 lb

## 2018-05-12 DIAGNOSIS — G4733 Obstructive sleep apnea (adult) (pediatric): Secondary | ICD-10-CM

## 2018-05-12 DIAGNOSIS — J84112 Idiopathic pulmonary fibrosis: Secondary | ICD-10-CM

## 2018-05-12 DIAGNOSIS — J9611 Chronic respiratory failure with hypoxia: Secondary | ICD-10-CM

## 2018-05-12 LAB — COMPREHENSIVE METABOLIC PANEL
ALBUMIN: 4.3 g/dL (ref 3.5–5.2)
ALT: 12 U/L (ref 0–35)
AST: 11 U/L (ref 0–37)
Alkaline Phosphatase: 90 U/L (ref 39–117)
BUN: 15 mg/dL (ref 6–23)
CALCIUM: 9.5 mg/dL (ref 8.4–10.5)
CHLORIDE: 96 meq/L (ref 96–112)
CO2: 28 meq/L (ref 19–32)
Creatinine, Ser: 0.81 mg/dL (ref 0.40–1.20)
GFR: 74.86 mL/min (ref >60.00)
Glucose, Bld: 244 mg/dL — ABNORMAL HIGH (ref 70–99)
POTASSIUM: 3.9 meq/L (ref 3.5–5.1)
SODIUM: 135 meq/L (ref 135–145)
Total Bilirubin: 0.4 mg/dL (ref 0.2–1.2)
Total Protein: 7.5 g/dL (ref 6.0–8.3)

## 2018-05-12 LAB — CBC WITH DIFFERENTIAL/PLATELET
BASOS PCT: 0.6 % (ref 0.0–3.0)
Basophils Absolute: 0.1 10*3/uL (ref 0.0–0.1)
EOS PCT: 1 % (ref 0.0–5.0)
Eosinophils Absolute: 0.1 10*3/uL (ref 0.0–0.7)
HEMATOCRIT: 38.7 % (ref 36.0–46.0)
HEMOGLOBIN: 12.7 g/dL (ref 12.0–15.0)
LYMPHS PCT: 13.7 % (ref 12.0–46.0)
Lymphs Abs: 1.3 10*3/uL (ref 0.7–4.0)
MCHC: 32.7 g/dL (ref 30.0–36.0)
MCV: 92.6 fl (ref 78.0–100.0)
Monocytes Absolute: 0.4 10*3/uL (ref 0.1–1.0)
Monocytes Relative: 4.2 % (ref 3.0–12.0)
Neutro Abs: 7.6 10*3/uL (ref 1.4–7.7)
Neutrophils Relative %: 80.5 % — ABNORMAL HIGH (ref 43.0–77.0)
Platelets: 232 10*3/uL (ref 150.0–400.0)
RBC: 4.19 Mil/uL (ref 3.87–5.11)
RDW: 14.6 % (ref 11.5–15.5)
WBC: 9.4 10*3/uL (ref 4.0–10.5)

## 2018-05-12 MED ORDER — MYCOPHENOLATE MOFETIL 500 MG PO TABS: 1000.0000 mg | ORAL_TABLET | Freq: Two times a day (BID) | ORAL | 3 refills | Status: DC

## 2018-05-12 NOTE — Assessment & Plan Note (Addendum)
Chronic respiratory failure with hypoxemia - stable since last visit Continue 4 L oxygen at rest Use 10 L oxygen with exertion

## 2018-05-12 NOTE — Assessment & Plan Note (Addendum)
  Obstructive sleep apnea Keep using CPAP at night

## 2018-05-12 NOTE — Assessment & Plan Note (Signed)
Patient Instructions  Interstitial lung disease Will check CBC, CMP today and call with results If labs are normal will increase cellcept to 1g twice a day  Chronic respiratory failure with hypoxemia - stable since last visit Continue 4 L oxygen at rest Use 10 L oxygen with exertion  Obstructive sleep apnea Keep using CPAP at night  Chest congestion: Use mucinex OTC twice daily  Follow up with Dr. [name] in 2 weeks or first available

## 2018-05-12 NOTE — Patient Instructions (Signed)
Interstitial lung disease Will check CBC, CMP today and call with results If labs are normal will increase cellcept to 1g twice a day  Chronic respiratory failure with hypoxemia - stable since last visit Continue 4 L oxygen at rest Use 10 L oxygen with exertion  Obstructive sleep apnea Keep using CPAP at night  Chest congestion: Use mucinex OTC twice daily  Follow up with Dr. [name] in 2 weeks or first available

## 2018-05-12 NOTE — Progress Notes (Signed)
Reviewed, agree 

## 2018-05-12 NOTE — Progress Notes (Signed)
 @Patient ID: Kaitlin Hamilton, female    DOB: 04/28/1951, 67 y.o.   MRN: [id number]  Chief Complaint  Patient presents with  . 2 Week Follow-up    Referring provider: Bedsole, Amy E, MD  Synopsis: patient first saw the [name] pulmonary clinic in March 2014 for evaluation of shortness of breath. She had significant cough, wheezing, and sputum production requiring multiple rounds of antibiotics. Because of crackles on lung exam and an abnormal pulmonary function test she was referred to us. We ordered full pulmonary function testing which showed moderate restriction and a depressed DLCO in proportion to her restriction. There is no airflow obstruction and no change with bronchodilator administration. A chest x-ray performed in February 2014 was read as normal.  An open lung biopsy was performed in December 2014 showing UIP.  Because she has had multiple good responses to steroids, we have treated her with cellcept and prednisone.  In January 2015 she had severe insomnia, hallucinations and gastritis on high dose prednisone.  She had a rash to bactrim. Since February 2015 she has been on cellcept and low dose prednisone and dapsone. Because of progression in hypoxemia and lung function testing in 2016 we changed her therapy to anti-fibrotic therapy with Esbriet. Because of a strong concern for an underlying connective tissue disease it was decided that she will be maintained on low-dose CellCept.  HPI 67 year old female with UIP, OSA, chronic hypoxemic respiratory failure and allergic rhinitis followed by Dr. McQuaid.   Tests: February 2014 chest x-ray at [facility] normal  11/2012 CT chest >> (McQuaid read) centrilobular nodules, interlobular septal thickening worse in bases and periphery R lung > L; some GGO in bases and periphery as well, some bronchiectasis in the bases R > L; findings suggestive of fibrosis but not UIP; question hypersensitivity pneumonitis given centrilobular nodules; also  question aspiration  03/2013 ANA, ANCA, Anti-Jo-1, ESR, RF, SCL-70, anti-centromere, SSA/SSB, all negative; CRP 0.6;  04/2013 Barium swallow> abnormal esophageal motility, GERD, hiatal hernia  04/2013 CT chest ([name])> findings suggestive of but not diagnostic of NSIP, small pulmonary nodule  05/17/2013 3 month prednisone trial 06/2013 open lung biopsy> UIP   February 2016 CT chest high resolution> slight progression in reticular abnormalities consistent with pulmonary fibrosis, uip  6 minute walk 10/19/2012 walked 500 feet in office on room air oxygenation did not drop below 90% 04/2013 6MW RA > 1100 feet, HR peak 109, O2 sat Nadir 87% 11/2013 6MW 1364 feet, O2 saturation nadir 78% 08/21/14 6MW 1400 feet 90% on 8L 11/2016 6MW 288M needed 6LNC July 2019 6MW 336 M required 8 L nasal cannula, O2 saturation dropped to 89%,  PFT Results Latest Ref Rng & Units 04/28/2018 07/10/2017 06/11/2016 10/15/2015 04/02/2015 03/01/2015 08/24/2014  FVC-Pre L 0.98 1.16 1.30 1.37 1.46 1.43 1.71  FVC-Predicted Pre % 31 34 38 40 42 41 49  FVC-Post L 1.10 1.14 1.26 1.40 1.33 1.30 1.56  FVC-Predicted Post % 35 33 37 41 38 37 45  Pre FEV1/FVC % % 94 91 90 91 90 90 91  Post FEV1/FCV % % 92 95 92 93 93 96 92  FEV1-Pre L 0.92 1.06 1.17 1.25 1.31 1.29 1.56  FEV1-Predicted Pre % 38 41 45 47 49 48 58  DLCO UNC% % 36 33 21 39 44 106 40  DLCO COR %Predicted % 96 83 76 97 103 74 83  TLC L - 2.67 2.37 2.34 2.56 - -  TLC % Predicted % - 49 44   43 47 - -  RV % Predicted % - 30 34 34 45 - -   OV 05/12/18 - follow up Patient presents for a 2 week follow up. At last visit her Cellcept was increased to 500 mg twice daily. She states that she has been doing well since last visit. She has been compliant with medications. She is still on O2 at 4 L [place] at rest and 10 L O2 with exertion. She states that sats have been stable on oxygen. She denies any chest pain, fever, or edema. Her congestion has improved as well since last  visit.    Allergies  Allergen Reactions  . Adhesive [Tape]     Redness, bruising with any adhesives- bandaids, patches, etc.   . Atovaquone Itching  . Codeine Hives and Swelling  . Demerol [Meperidine] Hives and Swelling  . Hydrocodone Nausea Only  . Trazodone And Nefazodone Itching  . Sulfa Antibiotics Rash    Immunization History  Administered Date(s) Administered  . Influenza Split 05/02/2005, 11/28/2014  . Influenza Whole 04/20/2012  . Influenza, High Dose Seasonal PF 04/16/2018  . Influenza,inj,Quad PF,6+ Mos 05/17/2013, 04/04/2014, 10/06/2014, 05/11/2015, 10/19/2015, 03/12/2016, 04/30/2017, 10/14/2017  . Influenza-Unspecified 04/20/2013  . Pneumococcal Conjugate-13 04/04/2014  . Pneumococcal Polysaccharide-23 04/20/2013, 04/16/2018  . Tdap 07/21/2009    Past Medical History:  Diagnosis Date  . Allergic rhinitis    uses Flonase daily  . Anesthesia complication    Per pt/ past endoscopy in 2015, the anesthetic spray in back throat caused her to have breathing problems  . Anxiety   . Bronchitis   . Chicken pox   . Constipation    takes Fiber daily  . Depression   . Diverticulosis   . Dysphagia   . GERD (gastroesophageal reflux disease)    takes Protonix daily  . H/O hiatal hernia   . Hemorrhoids   . History of bronchitis    2013  . History of colon polyps   . History of kidney stones   . Hyperlipidemia    lost 43 pounds and no meds required at present  . Hypertension    takes Verapamil and HCTZ daily  . Hypokalemia   . Hypothyroidism (acquired)    takes Synthroid daily  . Insomnia    doesn't take any meds for this  . Joint swelling    left thumb  . Migraines    last one about a month ago  . Non-insulin dependent type 2 diabetes mellitus ([place])    takes Glipizide daily  . OA (osteoarthritis)    left knee  . Oxygen deficiency   . Oxygen dependent    Pt is on continuous O2 at 3 liters!  . Pneumonia    last time in 2006  . PONV (postoperative  nausea and vomiting)   . Shortness of breath    with exertion;takes Singulair daily as well as Flonase  . Urinary frequency     Tobacco History: Social History   Tobacco Use  Smoking Status Never Smoker  Smokeless Tobacco Never Used   Counseling given: Yes   Outpatient Encounter Medications as of 05/12/2018  Medication Sig  . acetaminophen (TYLENOL) 500 MG tablet Take 1,000 mg by mouth every 6 (six) hours as needed for moderate pain.  . diphenhydrAMINE (BENADRYL) 25 mg capsule Take 25 mg by mouth as needed.  . ESBRIET 801 MG TABS TAKE 801MG BY MOUTH 3 TIMES A DAY WITH FOOD  . FIBER PO Take 1 tablet by mouth daily.  Marland Kitchen  fluticasone (FLONASE) 50 MCG/ACT nasal spray Place 1 spray into both nostrils daily.  . glucose blood (ONE TOUCH ULTRA TEST) test strip CHECK BLOOD SUGAR DAILY.  . levothyroxine (SYNTHROID, LEVOTHROID) 125 MCG tablet TAKE 1 TABLET BY MOUTH  DAILY BEFORE BREAKFAST  . losartan-hydrochlorothiazide (HYZAAR) 50-12.5 MG tablet Take 1 tablet by mouth daily.  . metFORMIN (GLUCOPHAGE-XR) 500 MG 24 hr tablet Take 2 tablets (1,000 mg total) by mouth daily with breakfast.  . montelukast (SINGULAIR) 10 MG tablet Take 1 tablet (10 mg total) by mouth at bedtime.  . mycophenolate (CELLCEPT) 500 MG tablet Take 1 tablet (500 mg total) by mouth daily.  . NON FORMULARY Place 4 L into the nose daily. 6-8 Liters with exertion  . nystatin cream (MYCOSTATIN) Apply 1 application topically 3 (three) times daily.  . ONGLYZA 5 MG TABS tablet TAKE 1 TABLET BY MOUTH  DAILY  . pantoprazole (PROTONIX) 40 MG tablet TAKE 1 TABLET BY MOUTH TWO  TIMES DAILY  . pioglitazone (ACTOS) 45 MG tablet Take 1 tablet (45 mg total) by mouth daily.  . pravastatin (PRAVACHOL) 40 MG tablet Take 1 tablet (40 mg total) by mouth daily.  . predniSONE (DELTASONE) 20 MG tablet Take 20 mg for 7 days, then 10 mg for 7 days, then stop  . tiZANidine (ZANAFLEX) 2 MG tablet Take 1 tablet (2 mg total) by mouth 2 (two) times daily  as needed for muscle spasms.  . verapamil (CALAN-SR) 180 MG CR tablet TAKE 1 TABLET BY MOUTH  DAILY   No facility-administered encounter medications on file as of 05/12/2018.      Review of Systems  Review of Systems  Constitutional: Negative.  Negative for chills and fever.  HENT: Negative.  Negative for congestion, sinus pressure and sinus pain.   Respiratory: Positive for shortness of breath. Negative for cough and wheezing.   Cardiovascular: Negative.  Negative for chest pain, palpitations and leg swelling.  Gastrointestinal: Negative.   Allergic/Immunologic: Negative.   Neurological: Negative.   Psychiatric/Behavioral: Negative.        Physical Exam  BP 128/74 (BP Location: Left Arm, Patient Position: Sitting, Cuff Size: Normal)   Pulse 70   Ht 5' 4.25" (1.632 m)   Wt 266 lb (120.7 kg)   SpO2 100% Comment: on 4L of O2  BMI 45.30 kg/m   Wt Readings from Last 5 Encounters:  05/12/18 266 lb (120.7 kg)  04/28/18 262 lb (118.8 kg)  04/16/18 260 lb 8 oz (118.2 kg)  03/31/18 259 lb 9.6 oz (117.8 kg)  01/26/18 265 lb 9.6 oz (120.5 kg)     Physical Exam  Constitutional: She is oriented to person, place, and time. She appears well-developed and well-nourished. No distress.  Cardiovascular: Normal rate and regular rhythm.  Pulmonary/Chest: Effort normal and breath sounds normal. No respiratory distress. She has no wheezes.  Musculoskeletal: She exhibits no edema.  Neurological: She is alert and oriented to person, place, and time.  Psychiatric: She has a normal mood and affect.  Nursing note and vitals reviewed.    Imaging: Mm 3d Screen Breast Bilateral  Result Date: 04/29/2018 CLINICAL DATA:  Screening. EXAM: DIGITAL SCREENING BILATERAL MAMMOGRAM WITH TOMO AND CAD COMPARISON:  Previous exam(s). ACR Breast Density Category a: The breast tissue is almost entirely fatty. FINDINGS: There are no findings suspicious for malignancy. Images were processed with CAD.  IMPRESSION: No mammographic evidence of malignancy. A result letter of this screening mammogram will be mailed directly to the patient. RECOMMENDATION: Screening mammogram   in one year. (Code:SM-B-01Y) BI-RADS CATEGORY  1: Negative. Electronically Signed   By: Michelle  Collins M.D.   On: 04/29/2018 16:13     Assessment & Plan:   Chronic hypoxemic respiratory failure (HCC) Chronic respiratory failure with hypoxemia - stable since last visit Continue 4 L oxygen at rest Use 10 L oxygen with exertion    OSA (obstructive sleep apnea)  Obstructive sleep apnea Keep using CPAP at night     UIP (usual interstitial pneumonitis) (HCC) Patient Instructions  Interstitial lung disease Will check CBC, CMP today and call with results If labs are normal will increase cellcept to 1g twice a day  Chronic respiratory failure with hypoxemia - stable since last visit Continue 4 L oxygen at rest Use 10 L oxygen with exertion  Obstructive sleep apnea Keep using CPAP at night  Chest congestion: Use mucinex OTC twice daily  Follow up with Dr. McQuaid in 2 weeks or first available         S , NP 05/12/2018  

## 2018-05-13 ENCOUNTER — Other Ambulatory Visit: Payer: Self-pay | Admitting: Family Medicine

## 2018-05-20 ENCOUNTER — Other Ambulatory Visit: Payer: Self-pay | Admitting: Family Medicine

## 2018-06-03 ENCOUNTER — Other Ambulatory Visit: Payer: Self-pay | Admitting: Family Medicine

## 2018-06-03 NOTE — Telephone Encounter (Signed)
Last office visit 04/16/2018 for DM.  Last refilled 07/17/2017 for 30 g with no refills.  Next Appt: 07/20/2018 for CPE.

## 2018-06-04 DIAGNOSIS — J8489 Other specified interstitial pulmonary diseases: Secondary | ICD-10-CM | POA: Diagnosis not present

## 2018-06-07 ENCOUNTER — Ambulatory Visit: Payer: Medicare Other

## 2018-06-11 ENCOUNTER — Encounter: Payer: Medicare Other | Admitting: Family Medicine

## 2018-06-14 ENCOUNTER — Other Ambulatory Visit (INDEPENDENT_AMBULATORY_CARE_PROVIDER_SITE_OTHER): Payer: Medicare Other

## 2018-06-14 ENCOUNTER — Ambulatory Visit: Payer: Medicare Other | Admitting: Pulmonary Disease

## 2018-06-14 ENCOUNTER — Encounter: Payer: Self-pay | Admitting: Pulmonary Disease

## 2018-06-14 VITALS — BP 118/58 | HR 90 | Ht 66.0 in | Wt 256.8 lb

## 2018-06-14 DIAGNOSIS — Z5181 Encounter for therapeutic drug level monitoring: Secondary | ICD-10-CM

## 2018-06-14 DIAGNOSIS — J84112 Idiopathic pulmonary fibrosis: Secondary | ICD-10-CM | POA: Diagnosis not present

## 2018-06-14 DIAGNOSIS — J9611 Chronic respiratory failure with hypoxia: Secondary | ICD-10-CM | POA: Diagnosis not present

## 2018-06-14 DIAGNOSIS — G4733 Obstructive sleep apnea (adult) (pediatric): Secondary | ICD-10-CM | POA: Diagnosis not present

## 2018-06-14 LAB — CBC WITH DIFFERENTIAL/PLATELET
BASOS PCT: 1.3 % (ref 0.0–3.0)
Basophils Absolute: 0.1 10*3/uL (ref 0.0–0.1)
EOS PCT: 1.2 % (ref 0.0–5.0)
Eosinophils Absolute: 0.1 10*3/uL (ref 0.0–0.7)
HEMATOCRIT: 41.4 % (ref 36.0–46.0)
Hemoglobin: 13.8 g/dL (ref 12.0–15.0)
LYMPHS ABS: 1.5 10*3/uL (ref 0.7–4.0)
LYMPHS PCT: 17.9 % (ref 12.0–46.0)
MCHC: 33.4 g/dL (ref 30.0–36.0)
MCV: 90.8 fl (ref 78.0–100.0)
MONOS PCT: 4.8 % (ref 3.0–12.0)
Monocytes Absolute: 0.4 10*3/uL (ref 0.1–1.0)
NEUTROS ABS: 6.5 10*3/uL (ref 1.4–7.7)
NEUTROS PCT: 74.8 % (ref 43.0–77.0)
PLATELETS: 234 10*3/uL (ref 150.0–400.0)
RBC: 4.56 Mil/uL (ref 3.87–5.11)
RDW: 14.3 % (ref 11.5–15.5)
WBC: 8.7 10*3/uL (ref 4.0–10.5)

## 2018-06-14 LAB — COMPREHENSIVE METABOLIC PANEL
ALT: 12 U/L (ref 0–35)
AST: 14 U/L (ref 0–37)
Albumin: 4.5 g/dL (ref 3.5–5.2)
Alkaline Phosphatase: 72 U/L (ref 39–117)
BILIRUBIN TOTAL: 0.4 mg/dL (ref 0.2–1.2)
BUN: 11 mg/dL (ref 6–23)
CALCIUM: 10.1 mg/dL (ref 8.4–10.5)
CHLORIDE: 96 meq/L (ref 96–112)
CO2: 31 meq/L (ref 19–32)
Creatinine, Ser: 0.7 mg/dL (ref 0.40–1.20)
GFR: 88.57 mL/min (ref >60.00)
GLUCOSE: 176 mg/dL — AB (ref 70–99)
POTASSIUM: 3.5 meq/L (ref 3.5–5.1)
Sodium: 138 mEq/L (ref 135–145)
Total Protein: 7.8 g/dL (ref 6.0–8.3)

## 2018-06-14 NOTE — Patient Instructions (Signed)
Idiopathic pulmonary fibrosis with autoimmune features seen on lung biopsy Continue Esbriet 3 times a day as you are doing Continue mycophenolate 1 g twice a day We will check blood work today (complete blood count, comprehensive metabolic panel) to make sure there is no evidence of toxicity from these medicines I am glad you have already had a flu shot Practice good hand hygiene  Chronic respiratory failure with hypoxemia: Keep using 4 L of oxygen at rest, 10 L with heavy exertion  Cough: Use over-the-counter Delsym  Trouble sleeping: Try using over-the-counter melatonin to reestablish a normal sleep-wake cycle.  I recommend only taking 0.5 mg at night.  Gastroesophageal reflux disease: Keep taking antacid therapy as you are doing  Diabetes mellitus with hyperglycemia: I believe your blood sugar will improve the further you get away from the prednisone Keep taking her diabetic medicines as directed by your primary care physician Dr. [name]  We will see you back in 4 to 6 weeks or sooner if needed

## 2018-06-14 NOTE — Progress Notes (Signed)
Subjective:    Patient ID: Kaitlin Hamilton, female    DOB: 10/11/1950, 67 y.o.   MRN: [id number]  Synopsis: Kaitlin Hamilton is a very pleasant 67 year old female who first saw the [facility] pulmonary clinic in March 2014 for evaluation of shortness of breath. She had significant cough, wheezing, and sputum production requiring multiple rounds of antibiotics. Because of crackles on lung exam and an abnormal pulmonary function test she was referred to [place]. We ordered full pulmonary function testing which showed moderate restriction and a depressed DLCO in proportion to her restriction. There is no airflow obstruction and no change with bronchodilator administration. A chest x-ray performed in February 2014 was read as normal.  An open lung biopsy was performed in December 2014 showing UIP.  Because she has had multiple good responses to steroids, we have treated her with cellcept and prednisone.  In January 2015 she had severe insomnia, hallucinations and gastritis on high dose prednisone.  She had a rash to bactrim. Since February 2015 she has been on cellcept and low dose prednisone and dapsone. Because of progression in hypoxemia and lung function testing in 2016 we changed her therapy to anti-fibrotic therapy with Esbriet. Because of a strong concern for an underlying connective tissue disease it was decided that she will be maintained on low-dose CellCept. In the fall 2019 she had a flare of her UIP after a bad upper respiratory infection and her lung function testing declined so we increased her mycophenolate.   HPI Chief Complaint  Patient presents with  . Follow-up    non-productive cough, increased issues with sleep after increasing cellcept     Kaitlin Hamilton says she doesn't feel great and she feels like she hasn't returned to where she was prior ot the cold in the fall.  She says that her blood sugars.    Coughing more but no mucus production.  No fever or chills.  Worse sleeping since taking  Cellcept she says that she has been taking trazodone but it is not as helpful.  She says she has a hard time falling asleep.  She feels quite jittery.  She also feels anxious.  She says she has not taken prednisone in about 4 weeks.  She wonders if her symptoms are due to the mycophenolate.    Past Medical History:  Diagnosis Date  . Allergic rhinitis    uses Flonase daily  . Anesthesia complication    Per pt/ past endoscopy in 2015, the anesthetic spray in back throat caused her to have breathing problems  . Anxiety   . Bronchitis   . Chicken pox   . Constipation    takes Fiber daily  . Depression   . Diverticulosis   . Dysphagia   . GERD (gastroesophageal reflux disease)    takes Protonix daily  . H/O hiatal hernia   . Hemorrhoids   . History of bronchitis    2013  . History of colon polyps   . History of kidney stones   . Hyperlipidemia    lost 43 pounds and no meds required at present  . Hypertension    takes Verapamil and HCTZ daily  . Hypokalemia   . Hypothyroidism (acquired)    takes Synthroid daily  . Insomnia    doesn't take any meds for this  . Joint swelling    left thumb  . Migraines    last one about a month ago  . Non-insulin dependent type 2 diabetes mellitus ([place])  takes Glipizide daily  . OA (osteoarthritis)    left knee  . Oxygen deficiency   . Oxygen dependent    Pt is on continuous O2 at 3 liters!  . Pneumonia    last time in 2006  . PONV (postoperative nausea and vomiting)   . Shortness of breath    with exertion;takes Singulair daily as well as Flonase  . Urinary frequency         Review of Systems  Constitutional: Negative for activity change, chills, fatigue and fever.  HENT: Negative for congestion, ear pain, nosebleeds, postnasal drip, rhinorrhea and sinus pressure.   Respiratory: Positive for cough and shortness of breath. Negative for wheezing.   Cardiovascular: Negative for chest pain, palpitations and leg swelling.   Gastrointestinal: Negative for abdominal pain and nausea.       Objective:   Physical Exam  Vitals:   06/14/18 1049  BP: (!) 118/58  Pulse: 90  Weight: 256 lb 12.8 oz (116.5 kg)  Height: '5\' 6"'  (1.676 m)    O2 saturation 06/14/2018 93% on 4L [place]  Gen: chronically ill appearing, obese HENT: OP clear, TM's clear, neck supple PULM: CTA B, normal percussion CV: RRR, no mgr, trace edema GI: BS+, soft, nontender Derm: no cyanosis or rash Psyche: normal mood and affect  Chest imaging: February 2014 chest [facility] at [facility] normal 11/2012 CT chest >> (Kasie Leccese read) centrilobular nodules, interlobular septal thickening worse in bases and periphery R lung > L; some GGO in bases and periphery as well, some bronchiectasis in the bases R > L; findings suggestive of fibrosis but not UIP; question hypersensitivity pneumonitis given centrilobular nodules; also question aspiration 04/2013 CT chest ([name])> findings suggestive of but not diagnostic of NSIP, small pulmonary nodule February 2016 CT chest high resolution> slight progression in reticular abnormalities consistent with pulmonary fibrosis, uip   Labs: 03/2013 ANA, ANCA, Anti-Jo-1, ESR, RF, SCL-70, anti-centromere, SSA/SSB, all negative; CRP 0.6;  04/2018 CBC and CMET WNL with exception of hyperglycemia  Other imaing 04/2013 Barium swallow> abnormal esophageal motility, GERD, hiatal hernia   Path: 06/2013 open lung biopsy> UIP   6 minute walk 10/19/2012 walked 500 feet in office on room air oxygenation did not drop below 90% 04/2013 6MW RA > 1100 feet, HR peak 109, O2 sat Nadir 87% 11/2013 6MW 1364 feet, O2 saturation nadir 78% 08/21/14 6MW 1400 feet 90% on 8L 11/2016 6MW 288M needed [facility] July 2019 6MW 336 M required 8 L nasal cannula, O2 saturation dropped to 89%,  PFT February 2014 simple spirometry performed by her primary care physician>> ratio 90%, FEV1 1.71 L (64% predicted, FVC 1.83 L 55% predicted; flow volume loop is not  consistent with obstruction 11/15/2012 Full PFT [name]> Ratio 87%, FEV1 2.00L > 2.07 with bronchodilator (3% change); TLC 3.44 L (65% pred), ERV 0.62 (57% pred), DLCO 15.1 ml/mmHg/min (59% pred) 04/2013 Full PFT> Ratio 93%, FEV1 1.92 L (71% pred), TLC 3.02L (56% pred), DLCO 15.94 (59% pred) May 2015 full pulmonary function test ratio 93%, FEV1 1.71 L (64% predicted, no change with bronchodilator), total lung capacity 2.67 L (49% predicted), DLCO 13.11 (40% predicted) 03/2015 PFT > Ratio 93%, FEV1 1.24L, FVC 1.33L (38% pred), TLC 2.56L (47% pred), DLCO 12.10 (44% pred) March 2017 pulmonary function testing ratio 93%, FVC (41% predicted), total lung capacity 2.34 L (43% protected), DLCO 10.53 (39% protected). November 2017 pulmonary function testing ratio 92%, FVC 1.26 L, 37% predicted, total lung capacity 2.37 L 44% predicted, DLCO 5.  89 21% percent predicted December 2018 lung function test FVC 1.14 L, 33% predicted, total lung capacity 2.67 L 49% predicted, DLCO 9.02 mL 33% predicted        Assessment & Plan:  Therapeutic drug monitoring - Plan: CBC with Differential/Platelet, Comprehensive metabolic panel  IPF (idiopathic pulmonary fibrosis) (HCC)  Chronic hypoxemic respiratory failure (HCC)  OSA (obstructive sleep apnea)  Discussion: Today Cruz symptoms are about the same as when I saw her last time.  I believe she had a flare of her IPF which has autoimmune features so we are treating her with an increased dose of CellCept.  Her trouble sleeping, anxiety, and high blood sugar I believe are all due to the prednisone she took a month ago.  She has a very sensitive response to that medicine so I think we need to be cautious with it again in the future.  I believe that her hyperglycemia and sleep trouble will improve.  Plan: Idiopathic pulmonary fibrosis with autoimmune features seen on lung biopsy Continue Esbriet 3 times a day as you are doing Continue mycophenolate 1 g twice a day We  will check blood work today (complete blood count, comprehensive metabolic panel) to make sure there is no evidence of toxicity from these medicines I am glad you have already had a flu shot Practice good hand hygiene  Chronic respiratory failure with hypoxemia: Keep using 4 L of oxygen at rest, 10 L with heavy exertion  Cough: Use over-the-counter Delsym  Trouble sleeping: Try using over-the-counter melatonin to reestablish a normal sleep-wake cycle.  I recommend only taking 0.5 mg at night.  Gastroesophageal reflux disease: Keep taking antacid therapy as you are doing  Diabetes mellitus with hyperglycemia: I believe your blood sugar will improve the further you get away from the prednisone Keep taking her diabetic medicines as directed by your primary care physician Dr. [name]  We will see you back in 4 to 6 weeks or sooner if needed  Greater than 50% of this 30-minute visit was spent face-to-face   Current Outpatient Medications:  .  acetaminophen (TYLENOL) 500 MG tablet, Take 1,000 mg by mouth every 6 (six) hours as needed for moderate pain., Disp: , Rfl:  .  diphenhydrAMINE (BENADRYL) 25 mg capsule, Take 25 mg by mouth as needed., Disp: , Rfl:  .  ESBRIET 801 MG TABS, TAKE 801MG BY MOUTH 3 TIMES A DAY WITH FOOD, Disp: 90 tablet, Rfl: 11 .  FIBER PO, Take 1 tablet by mouth daily., Disp: , Rfl:  .  fluticasone (FLONASE) 50 MCG/ACT nasal spray, Place 1 spray into both nostrils daily., Disp: , Rfl:  .  glucose blood (ONE TOUCH ULTRA TEST) test strip, CHECK BLOOD SUGAR DAILY., Disp: 100 each, Rfl: 3 .  levothyroxine (SYNTHROID, LEVOTHROID) 125 MCG tablet, TAKE 1 TABLET BY MOUTH  DAILY BEFORE BREAKFAST, Disp: 90 tablet, Rfl: 0 .  losartan-hydrochlorothiazide (HYZAAR) 50-12.5 MG tablet, Take 1 tablet by mouth daily., Disp: 90 tablet, Rfl: 1 .  metFORMIN (GLUCOPHAGE-XR) 500 MG 24 hr tablet, Take 2 tablets (1,000 mg total) by mouth daily with breakfast., Disp: 60 tablet, Rfl: 5 .   montelukast (SINGULAIR) 10 MG tablet, Take 1 tablet (10 mg total) by mouth at bedtime., Disp: 90 tablet, Rfl: 3 .  mycophenolate (CELLCEPT) 500 MG tablet, Take 2 tablets (1,000 mg total) by mouth 2 (two) times daily., Disp: 120 tablet, Rfl: 3 .  NON FORMULARY, Place 4 L into the nose daily. 6-8 Liters with exertion, Disp: ,  Rfl:  .  nystatin cream (MYCOSTATIN), Apply 1 application topically 3 (three) times daily., Disp: 30 g, Rfl: 0 .  ONGLYZA 5 MG TABS tablet, TAKE 1 TABLET BY MOUTH  DAILY, Disp: 90 tablet, Rfl: 0 .  pantoprazole (PROTONIX) 40 MG tablet, TAKE 1 TABLET BY MOUTH TWO  TIMES DAILY, Disp: 180 tablet, Rfl: 0 .  pioglitazone (ACTOS) 45 MG tablet, Take 1 tablet (45 mg total) by mouth daily., Disp: 90 tablet, Rfl: 3 .  pravastatin (PRAVACHOL) 40 MG tablet, Take 1 tablet (40 mg total) by mouth daily., Disp: 90 tablet, Rfl: 2 .  tiZANidine (ZANAFLEX) 2 MG tablet, Take 1 tablet (2 mg total) by mouth 2 (two) times daily as needed for muscle spasms., Disp: 30 tablet, Rfl: 0 .  verapamil (CALAN-SR) 180 MG CR tablet, TAKE 1 TABLET BY MOUTH  DAILY, Disp: 90 tablet, Rfl: 0

## 2018-06-18 ENCOUNTER — Telehealth: Payer: Self-pay | Admitting: Pulmonary Disease

## 2018-06-18 MED ORDER — MYCOPHENOLATE MOFETIL 500 MG PO TABS: 1000.0000 mg | ORAL_TABLET | Freq: Two times a day (BID) | ORAL | 3 refills | Status: DC

## 2018-06-18 NOTE — Telephone Encounter (Signed)
Called patient, unable to reach. Left message to give us a call back.  

## 2018-06-18 NOTE — Telephone Encounter (Signed)
Plan from pt's OV 06/14/18: Idiopathic pulmonary fibrosis with autoimmune features seen on lung biopsy Continue Esbriet 3 times a day as you are doing Continue mycophenolate 1 g twice a day We will check blood work today (complete blood count, comprehensive metabolic panel) to make sure there is no evidence of toxicity from these medicines.  Called and spoke with pt who stated [facility] was needing a new Rx for her mycophenolate. I stated to pt that I would take care of sending a new Rx in for her. Pt expressed understanding. Nothing further needed.

## 2018-06-28 ENCOUNTER — Encounter: Payer: Self-pay | Admitting: Family Medicine

## 2018-06-28 ENCOUNTER — Other Ambulatory Visit: Payer: Self-pay | Admitting: *Deleted

## 2018-06-28 ENCOUNTER — Ambulatory Visit
Admission: RE | Admit: 2018-06-28 | Discharge: 2018-06-28 | Disposition: A | Discharge status: Home / Self Care | Payer: Medicare Other | Source: Ambulatory Visit | Attending: Family Medicine | Admitting: Family Medicine

## 2018-06-28 ENCOUNTER — Telehealth: Payer: Self-pay

## 2018-06-28 ENCOUNTER — Ambulatory Visit (INDEPENDENT_AMBULATORY_CARE_PROVIDER_SITE_OTHER): Payer: Medicare Other | Admitting: Family Medicine

## 2018-06-28 VITALS — BP 110/72 | HR 91 | Temp 98.1°F | Wt 253.0 lb

## 2018-06-28 DIAGNOSIS — K5792 Diverticulitis of intestine, part unspecified, without perforation or abscess without bleeding: Secondary | ICD-10-CM | POA: Insufficient documentation

## 2018-06-28 DIAGNOSIS — R109 Unspecified abdominal pain: Secondary | ICD-10-CM | POA: Diagnosis not present

## 2018-06-28 DIAGNOSIS — R1032 Left lower quadrant pain: Secondary | ICD-10-CM | POA: Diagnosis not present

## 2018-06-28 DIAGNOSIS — K5790 Diverticulosis of intestine, part unspecified, without perforation or abscess without bleeding: Secondary | ICD-10-CM | POA: Diagnosis not present

## 2018-06-28 DIAGNOSIS — T3695XA Adverse effect of unspecified systemic antibiotic, initial encounter: Secondary | ICD-10-CM

## 2018-06-28 DIAGNOSIS — B379 Candidiasis, unspecified: Secondary | ICD-10-CM | POA: Diagnosis not present

## 2018-06-28 DIAGNOSIS — N200 Calculus of kidney: Secondary | ICD-10-CM | POA: Diagnosis not present

## 2018-06-28 LAB — POCT URINALYSIS DIPSTICK
Bilirubin, UA: NEGATIVE
GLUCOSE UA: NEGATIVE
KETONES UA: 5
Leukocytes, UA: NEGATIVE
NITRITE UA: NEGATIVE
Protein, UA: POSITIVE — AB
SPEC GRAV UA: 1.025 (ref 1.010–1.025)
Urobilinogen, UA: 1 E.U./dL
pH, UA: 6 (ref 5.0–8.0)

## 2018-06-28 MED ORDER — IOPAMIDOL (ISOVUE-300) INJECTION 61%
100.0000 mL | Freq: Once | INTRAVENOUS | Status: AC | PRN
  Administered 2018-06-28: 100 mL via INTRAVENOUS

## 2018-06-28 MED ORDER — VERAPAMIL HCL ER 180 MG PO TBCR: 180.0000 mg | EXTENDED_RELEASE_TABLET | Freq: Every day | ORAL | 1 refills | Status: DC

## 2018-06-28 MED ORDER — SAXAGLIPTIN HCL 5 MG PO TABS: 5.0000 mg | ORAL_TABLET | Freq: Every day | ORAL | 1 refills | Status: DC

## 2018-06-28 MED ORDER — PANTOPRAZOLE SODIUM 40 MG PO TBEC: 40.0000 mg | DELAYED_RELEASE_TABLET | Freq: Two times a day (BID) | ORAL | 1 refills | Status: DC

## 2018-06-28 MED ORDER — FLUCONAZOLE 150 MG PO TABS: 150.0000 mg | ORAL_TABLET | ORAL | 0 refills | Status: AC

## 2018-06-28 MED ORDER — AMOXICILLIN-POT CLAVULANATE 875-125 MG PO TABS: 1.0000 | ORAL_TABLET | Freq: Three times a day (TID) | ORAL | 0 refills | Status: AC

## 2018-06-28 NOTE — Telephone Encounter (Addendum)
Kaitlin Hamilton at [facility] called report on Hamilton abd/pelvis. Pt is waiting. Report taken to Dr Einar Pheasant and report in epic. Dr Einar Pheasant will speak with pt.

## 2018-06-28 NOTE — Progress Notes (Signed)
Subjective:     Kaitlin Hamilton is a 67 y.o. female presenting for Abdominal Pain (started on 06/26/18. Left side pain, radiating down to the left groin area. She did have vomiting after going out to eat on 12/5.)     Abdominal Pain  This is a new problem. The current episode started in the past 7 days. The onset quality is sudden. The problem occurs constantly. The problem has been gradually improving. The pain is located in the LLQ and left flank. The pain is severe. The quality of the pain is sharp. The abdominal pain does not radiate. Associated symptoms include anorexia, diarrhea, dysuria, a fever (99.8 on Saturday) and nausea. Pertinent negatives include no constipation, flatus, frequency, headaches or vomiting. Associated symptoms comments: Vaginal irritation. The pain is aggravated by movement. The pain is relieved by being still. She has tried nothing (hydration, tylenol) for the symptoms. The treatment provided mild relief.       Review of Systems  Constitutional: Positive for fever (99.8 on Saturday).  Gastrointestinal: Positive for abdominal pain, anorexia, diarrhea and nausea. Negative for constipation, flatus and vomiting.  Genitourinary: Positive for dysuria and vaginal discharge. Negative for frequency.  Neurological: Negative for headaches.     Social History   Tobacco Use  Smoking Status Never Smoker  Smokeless Tobacco Never Used        Objective:    BP Readings from Last 3 Encounters:  06/28/18 110/72  06/14/18 (!) 118/58  05/12/18 128/74   Wt Readings from Last 3 Encounters:  06/28/18 253 lb (114.8 kg)  06/14/18 256 lb 12.8 oz (116.5 kg)  05/12/18 266 lb (120.7 kg)    BP 110/72   Pulse 91   Temp 98.1 F (36.7 C)   Wt 253 lb (114.8 kg)   SpO2 98% Comment: on 4 liters of O2  BMI 40.84 kg/m    Physical Exam  Constitutional: She appears well-developed and well-nourished. No distress.  HENT:  Right Ear: External ear normal.  Left Ear: External  ear normal.  Nose: Nose normal.  Eyes: Conjunctivae and EOM are normal.  Neck: Neck supple.  Cardiovascular: Normal rate and regular rhythm.  No murmur heard. Pulmonary/Chest: Effort normal and breath sounds normal.  On 4L O2, breathing comfortably  Abdominal: Soft. Normal appearance and bowel sounds are normal. She exhibits no distension and no mass. There is tenderness (referred pain) in the epigastric area and left lower quadrant. There is no rigidity, no rebound, no guarding and no CVA tenderness.  obese  Neurological: She is alert.  Skin: Skin is warm and dry. Capillary refill takes less than 2 seconds. She is not diaphoretic.  Psychiatric: She has a normal mood and affect.    UA: normal apart from + protein      Assessment & Plan:   Problem List Items Addressed This Visit    None    Visit Diagnoses    Diverticulitis    -  Primary   Relevant Medications   amoxicillin-clavulanate (AUGMENTIN) 875-125 MG tablet   Other Relevant Orders   CT Abdomen Pelvis W Contrast   Diverticulosis       Relevant Orders   CT Abdomen Pelvis W Contrast   LLQ abdominal pain       Relevant Orders   CT Abdomen Pelvis W Contrast   Flank pain       Relevant Orders   POCT urinalysis dipstick (Completed)   CT Abdomen Pelvis W Contrast  LLQ abdominal pain and documented history of diverticulosis on CT in 2015 concerning for possible diverticulitis. Given that she is on immunosuppressive therapy and chronically ill will evaluate with CT to r/o rupture and confirm diagnosis- though over all appears well with stable VS.   Ciprofloxicin had drug interactions so elected to treat with Augmentin TID. Pt reported hx of yeast infection on this medication so Fluconazole sent in to take in the event she gets an infection.    Return in about 2 days (around 06/30/2018) for recheck.  Lesleigh Noe, MD

## 2018-06-28 NOTE — Telephone Encounter (Signed)
Received report Spoke to patient  Return to clinic in 2 days for re-eval Already on abx

## 2018-06-28 NOTE — Patient Instructions (Signed)

## 2018-06-29 ENCOUNTER — Other Ambulatory Visit: Payer: Self-pay | Admitting: *Deleted

## 2018-06-29 MED ORDER — LEVOTHYROXINE SODIUM 125 MCG PO TABS: ORAL_TABLET | ORAL | 0 refills | Status: DC

## 2018-06-30 ENCOUNTER — Ambulatory Visit: Payer: Medicare Other | Admitting: Family Medicine

## 2018-06-30 ENCOUNTER — Encounter: Payer: Self-pay | Admitting: Family Medicine

## 2018-06-30 VITALS — BP 122/64 | HR 85 | Temp 98.0°F | Ht 66.0 in | Wt 254.8 lb

## 2018-06-30 DIAGNOSIS — K5792 Diverticulitis of intestine, part unspecified, without perforation or abscess without bleeding: Secondary | ICD-10-CM | POA: Diagnosis not present

## 2018-06-30 NOTE — Patient Instructions (Signed)
Continue the antibiotics If you are still having abdominal pain or decreased appetite after you finish the antibiotics return to clinic  If you develop fever/chills, worsening abdominal pain, increased distention of the abdomen, or do not pass gas or have bowel movements - return sooner

## 2018-06-30 NOTE — Assessment & Plan Note (Signed)
Day 3 of antibiotics and improving per patient. CT scan confirming diagnosis. Still quite tender on exam but as pt endorses improvement feel that Abx are improving. Strict return precautions given. If symptoms persist will have her return at end of Abx.

## 2018-06-30 NOTE — Progress Notes (Signed)
   Subjective:     TONIE ELSEY is a 67 y.o. female presenting for Abdominal Pain (2 day follow up. Feeling better. )     HPI  #Divericulitis - feeling better - no fever - not eating much - the dye from the CT scan gave her a bad taste in her mouth - abdominal pain is improved, but still better - at least 75% better pain - still having normal BM - urinating ok - taking the antibiotic and tolerating it - did have some vaginal itching  On 4 L of o2   Review of Systems See HPI  Social History   Tobacco Use  Smoking Status Never Smoker  Smokeless Tobacco Never Used        Objective:    BP Readings from Last 3 Encounters:  06/30/18 122/64  06/28/18 110/72  06/14/18 (!) 118/58   Wt Readings from Last 3 Encounters:  06/30/18 254 lb 12 oz (115.6 kg)  06/28/18 253 lb (114.8 kg)  06/14/18 256 lb 12.8 oz (116.5 kg)    BP 122/64   Pulse 85   Temp 98 F (36.7 C)   Ht 5\' 6"  (1.676 m)   Wt 254 lb 12 oz (115.6 kg)   SpO2 98% Comment: on 4 liters of 02  BMI 41.12 kg/m    Physical Exam  Constitutional: She appears well-developed and well-nourished. No distress.  HENT:  Right Ear: External ear normal.  Left Ear: External ear normal.  Nose: Nose normal.  Eyes: Conjunctivae and EOM are normal.  Neck: Neck supple.  Cardiovascular: Normal rate and regular rhythm.  Pulmonary/Chest: Effort normal. She has no wheezes. She has no rhonchi. She has no rales.  On 4L O2  Abdominal: Soft. Normal appearance. She exhibits no distension. Bowel sounds are decreased. There is tenderness in the periumbilical area and left lower quadrant. There is guarding. There is no rigidity and no rebound.  obese  Neurological: She is alert.  Skin: Skin is warm and dry. Capillary refill takes less than 2 seconds. She is not diaphoretic.  Psychiatric: She has a normal mood and affect.          Assessment & Plan:   Problem List Items Addressed This Visit      Other   Diverticulitis  - Primary    Day 3 of antibiotics and improving per patient. CT scan confirming diagnosis. Still quite tender on exam but as pt endorses improvement feel that Abx are improving. Strict return precautions given. If symptoms persist will have her return at end of Abx.           Return if symptoms worsen or fail to improve.  Lesleigh Noe, MD

## 2018-07-04 DIAGNOSIS — J8489 Other specified interstitial pulmonary diseases: Secondary | ICD-10-CM | POA: Diagnosis not present

## 2018-07-07 ENCOUNTER — Telehealth: Payer: Self-pay | Admitting: Family Medicine

## 2018-07-07 DIAGNOSIS — E1165 Type 2 diabetes mellitus with hyperglycemia: Secondary | ICD-10-CM

## 2018-07-07 NOTE — Telephone Encounter (Signed)
-----   Message from Eustace Pen, LPN sent at [id number]  4:08 PM EST ----- Regarding: Labs 12/19 Lab orders needed. Thank you.  Insurance:  [facility]

## 2018-07-08 ENCOUNTER — Ambulatory Visit (INDEPENDENT_AMBULATORY_CARE_PROVIDER_SITE_OTHER): Payer: Medicare Other

## 2018-07-08 VITALS — BP 122/76 | HR 85 | Temp 97.9°F | Ht 66.5 in | Wt 253.5 lb

## 2018-07-08 DIAGNOSIS — Z0000 Encounter for general adult medical examination without abnormal findings: Secondary | ICD-10-CM | POA: Diagnosis not present

## 2018-07-08 DIAGNOSIS — E1165 Type 2 diabetes mellitus with hyperglycemia: Secondary | ICD-10-CM

## 2018-07-08 LAB — LIPID PANEL
CHOLESTEROL: 188 mg/dL (ref 0–200)
HDL: 70.8 mg/dL (ref >39.00)
LDL Cholesterol: 87 mg/dL (ref 0–99)
NONHDL: 117.37
Total CHOL/HDL Ratio: 3
Triglycerides: 153 mg/dL — ABNORMAL HIGH (ref 0.0–149.0)
VLDL: 30.6 mg/dL (ref 0.0–40.0)

## 2018-07-08 LAB — HEMOGLOBIN A1C: HEMOGLOBIN A1C: 8.2 % — AB (ref 4.6–6.5)

## 2018-07-08 NOTE — Progress Notes (Signed)
I reviewed health advisor's note, was available for consultation, and agree with documentation and plan.   Signed,  Pleasant Britz T. Timonthy Hovater, MD  

## 2018-07-08 NOTE — Progress Notes (Signed)
Subjective:   Kaitlin Hamilton is a 67 y.o. female who presents for Medicare Annual (Subsequent) preventive examination.  Review of Systems:  N/A Cardiac Risk Factors include: advanced age (>28men, >96 women);diabetes mellitus;dyslipidemia;obesity (BMI >30kg/m2);hypertension     Objective:     Vitals: BP 122/76 (BP Location: Right Arm, Patient Position: Sitting, Cuff Size: Normal)   Pulse 85   Temp 97.9 F (36.6 C) (Oral)   Ht 5' 6.5" (1.689 m) Comment: shoes  Wt 253 lb 8 oz (115 kg)   SpO2 95%   BMI 40.30 kg/m   Body mass index is 40.3 kg/m.  Advanced Directives 07/08/2018 06/02/2017 01/29/2016 04/27/2014 08/09/2013 08/08/2013 08/01/2013  Does Patient Have a Medical Advance Directive? No No No No Patient does not have advance directive;Patient would not like information Patient does not have advance directive;Patient would not like information Patient does not have advance directive;Patient would not like information  Would patient like information on creating a medical advance directive? No - Patient declined - Yes - Educational materials given No - patient declined information - - -  Pre-existing out of facility DNR order (yellow form or pink MOST form) - - - - No - No    Tobacco Social History   Tobacco Use  Smoking Status Never Smoker  Smokeless Tobacco Never Used     Counseling given: No   Clinical Intake:  Pre-visit preparation completed: Yes  Pain : No/denies pain Pain Score: 0-No pain     Nutritional Status: BMI > 30  Obese Nutritional Risks: None  How often do you need to have someone help you when you read instructions, pamphlets, or other written materials from your doctor or pharmacy?: 1 - Never What is the last grade level you completed in school?: GED  Interpreter Needed?: No  Comments: pt is a widow Information entered by :: LPinson, LPN  Past Medical History:  Diagnosis Date  . Allergic rhinitis    uses Flonase daily  . Anesthesia complication     Per pt/ past endoscopy in 2015, the anesthetic spray in back throat caused her to have breathing problems  . Anxiety   . Bronchitis   . Chicken pox   . Constipation    takes Fiber daily  . Depression   . Diverticulosis   . Dysphagia   . GERD (gastroesophageal reflux disease)    takes Protonix daily  . H/O hiatal hernia   . Hemorrhoids   . History of bronchitis    2013  . History of colon polyps   . History of kidney stones   . Hyperlipidemia    lost 43 pounds and no meds required at present  . Hypertension    takes Verapamil and HCTZ daily  . Hypokalemia   . Hypothyroidism (acquired)    takes Synthroid daily  . Insomnia    doesn't take any meds for this  . Joint swelling    left thumb  . Migraines    last one about a month ago  . Non-insulin dependent type 2 diabetes mellitus ([place])    takes Glipizide daily  . OA (osteoarthritis)    left knee  . Oxygen deficiency   . Oxygen dependent    Pt is on continuous O2 at 3 liters!  . Pneumonia    last time in 2006  . PONV (postoperative nausea and vomiting)   . Shortness of breath    with exertion;takes Singulair daily as well as Flonase  . Urinary frequency  Past Surgical History:  Procedure Laterality Date  . ABDOMINAL EXPLORATION SURGERY     For Ovarian Cyst   . APPENDECTOMY    . CHOLECYSTECTOMY    . ENTEROSCOPY N/A 08/12/2013   Procedure: ENTEROSCOPY;  Surgeon: Lafayette Dragon, MD;  Location: [facility];  Service: Endoscopy;  Laterality: N/A;  . ESOPHAGOGASTRODUODENOSCOPY    . left knee arthroscopy    . LITHOTRIPSY     x 2  . LUNG BIOPSY Right 06/29/2013   Procedure: LUNG BIOPSY;  Surgeon: Melrose Nakayama, MD;  Location: [place];  Service: Thoracic;  Laterality: Right;  . TCS    . TUBAL LIGATION    . VIDEO ASSISTED THORACOSCOPY Right 06/29/2013   Procedure: VIDEO ASSISTED THORACOSCOPY;  Surgeon: Melrose Nakayama, MD;  Location: [place];  Service: Thoracic;  Laterality: Right;   Family History    Problem Relation Age of Onset  . Rheum arthritis Mother   . Rheum arthritis Sister   . Prostate cancer Brother   . Heart disease Brother   . Heart disease Maternal Grandmother   . Heart disease Maternal Grandfather   . Uterine cancer Daughter   . Colon cancer Neg Hx   . Breast cancer Neg Hx    Social History   Socioeconomic History  . Marital status: Widowed    Spouse name: Not on file  . Number of children: 2  . Years of education: Not on file  . Highest education level: Not on file  Occupational History  . Occupation: Day Care at [employer]  . Financial resource strain: Not on file  . Food insecurity:    Worry: Not on file    Inability: Not on file  . Transportation needs:    Medical: Not on file    Non-medical: Not on file  Tobacco Use  . Smoking status: Never Smoker  . Smokeless tobacco: Never Used  Substance and Sexual Activity  . Alcohol use: No    Alcohol/week: 0.0 standard drinks  . Drug use: No  . Sexual activity: Never    Birth control/protection: Post-menopausal  Lifestyle  . Physical activity:    Days per week: Not on file    Minutes per session: Not on file  . Stress: Not on file  Relationships  . Social connections:    Talks on phone: Not on file    Gets together: Not on file    Attends religious service: Not on file    Active member of club or organization: Not on file    Attends meetings of clubs or organizations: Not on file    Relationship status: Not on file  Other Topics Concern  . Not on file  Social History Narrative   Daily caffeine     Exercise: none recently    Diet: poor    Outpatient Encounter Medications as of 07/08/2018  Medication Sig  . acetaminophen (TYLENOL) 500 MG tablet Take 1,000 mg by mouth every 6 (six) hours as needed for moderate pain.  Marland Kitchen amoxicillin-clavulanate (AUGMENTIN) 875-125 MG tablet Take 1 tablet by mouth every 8 (eight) hours for 10 days.  . diphenhydrAMINE (BENADRYL) 25 mg capsule Take 25 mg by  mouth as needed.  . ESBRIET 801 MG TABS TAKE 801MG  BY MOUTH 3 TIMES A DAY WITH FOOD  . FIBER PO Take 1 tablet by mouth daily.  . fluticasone (FLONASE) 50 MCG/ACT nasal spray Place 1 spray into both nostrils daily.  Marland Kitchen glucose blood (ONE TOUCH ULTRA TEST)  test strip CHECK BLOOD SUGAR DAILY.  Marland Kitchen levothyroxine (SYNTHROID, LEVOTHROID) 125 MCG tablet TAKE 1 TABLET BY MOUTH  DAILY BEFORE BREAKFAST  . losartan-hydrochlorothiazide (HYZAAR) 50-12.5 MG tablet Take 1 tablet by mouth daily.  . metFORMIN (GLUCOPHAGE-XR) 500 MG 24 hr tablet Take 2 tablets (1,000 mg total) by mouth daily with breakfast.  . montelukast (SINGULAIR) 10 MG tablet Take 1 tablet (10 mg total) by mouth at bedtime.  . mycophenolate (CELLCEPT) 500 MG tablet Take 2 tablets (1,000 mg total) by mouth 2 (two) times daily.  . NON FORMULARY Place 4 L into the nose daily. 10 Liters with exertion  . nystatin cream (MYCOSTATIN) Apply 1 application topically 3 (three) times daily.  . pantoprazole (PROTONIX) 40 MG tablet Take 1 tablet (40 mg total) by mouth 2 (two) times daily.  . pioglitazone (ACTOS) 45 MG tablet Take 1 tablet (45 mg total) by mouth daily.  . pravastatin (PRAVACHOL) 40 MG tablet Take 1 tablet (40 mg total) by mouth daily.  . saxagliptin HCl (ONGLYZA) 5 MG TABS tablet Take 1 tablet (5 mg total) by mouth daily.  . verapamil (CALAN-SR) 180 MG CR tablet Take 1 tablet (180 mg total) by mouth daily.   No facility-administered encounter medications on file as of 07/08/2018.     Activities of Daily Living In your present state of health, do you have any difficulty performing the following activities: 07/08/2018  Hearing? N  Vision? N  Difficulty concentrating or making decisions? N  Walking or climbing stairs? Y  Comment Shortness of breath  Dressing or bathing? N  Doing errands, shopping? N  Preparing Food and eating ? N  Using the Toilet? N  In the past six months, have you accidently leaked urine? N  Do you have problems  with loss of bowel control? N  Managing your Medications? N  Managing your Finances? N  Housekeeping or managing your Housekeeping? N  Some recent data might be hidden    Patient Care Team: [name], MD as PCP - General (Family Medicine) Juanito Doom, MD as Consulting Physician (Pulmonary Disease)    Assessment:   This is a routine wellness examination for Kaitlin Hamilton.   Hearing Screening   125Hz  250Hz  500Hz  1000Hz  2000Hz  3000Hz  4000Hz  6000Hz  8000Hz   Right ear:   40 40 40  40    Left ear:   40 40 40  40    Vision Screening Comments: Vision exam in 2019 @ [facility]   Exercise Activities and Dietary recommendations Current Exercise Habits: Home exercise routine, Exercise limited by: None identified  Goals    . Increase water intake     Starting 07/08/2018, I will continue to drink at least 6-8 glasses of water daily.        Fall Risk Fall Risk  07/08/2018 06/02/2017 01/30/2017 01/29/2016  Falls in the past year? 0 No No No   Depression Screen PHQ 2/9 Scores 07/08/2018 06/02/2017 01/30/2017 01/29/2016  PHQ - 2 Score 0 0 2 0  PHQ- 9 Score 0 0 5 -     Cognitive Function MMSE - Mini Mental State Exam 07/08/2018 06/02/2017  Orientation to time 5 5  Orientation to Place 5 5  Registration 3 3  Attention/ Calculation 0 0  Recall 3 3  Language- name 2 objects 0 0  Language- repeat 1 1  Language- follow 3 step command 3 3  Language- read & follow direction 0 0  Write a sentence 0 0  Copy design 0 0  Total score 20 20     PLEASE NOTE: A Mini-Cog screen was completed. Maximum score is 20. A value of 0 denotes this part of Folstein MMSE was not completed or the patient failed this part of the Mini-Cog screening.   Mini-Cog Screening Orientation to Time - Max 5 pts Orientation to Place - Max 5 pts Registration - Max 3 pts Recall - Max 3 pts Language Repeat - Max 1 pts Language Follow 3 Step Command - Max 3 pts     Immunization History  Administered Date(s)  Administered  . Influenza Split 05/02/2005, 11/28/2014  . Influenza Whole 04/20/2012  . Influenza, High Dose Seasonal PF 04/16/2018  . Influenza,inj,Quad PF,6+ Mos 05/17/2013, 04/04/2014, 10/06/2014, 05/11/2015, 10/19/2015, 03/12/2016, 04/30/2017, 10/14/2017  . Influenza-Unspecified 04/20/2013  . Pneumococcal Conjugate-13 04/04/2014  . Pneumococcal Polysaccharide-23 04/20/2013, 04/16/2018  . Tdap 07/21/2009    Screening Tests Health Maintenance  Topic Date Due  . FOOT EXAM  09/08/2018  . HEMOGLOBIN A1C  01/07/2019  . OPHTHALMOLOGY EXAM  02/10/2019  . TETANUS/TDAP  07/22/2019  . MAMMOGRAM  04/29/2020  . INFLUENZA VACCINE  Completed  . DEXA SCAN  Completed  . Hepatitis C Screening  Completed  . PNA vac Low Risk Adult  Completed      Plan:     I have personally reviewed, addressed, and noted the following in the patient's chart:  A. Medical and social history B. Use of alcohol, tobacco or illicit drugs  C. Current medications and supplements D. Functional ability and status E.  Nutritional status F.  Physical activity G. Advance directives H. List of other physicians I.  Hospitalizations, surgeries, and ER visits in previous 12 months J.  [place] to include hearing, vision, cognitive, depression L. Referrals and appointments - none  In addition, I have reviewed and discussed with patient certain preventive protocols, quality metrics, and best practice recommendations. A written personalized care plan for preventive services as well as general preventive health recommendations were provided to patient.  See attached scanned questionnaire for additional information.   Signed,   Lindell Noe, MHA, BS, LPN Health Coach

## 2018-07-08 NOTE — Patient Instructions (Addendum)
Kaitlin Hamilton , Thank you for taking time to come for your Medicare Wellness Visit. I appreciate your ongoing commitment to your health goals. Please review the following plan we discussed and let me know if I can assist you in the future.   These are the goals we discussed: Goals    . Increase water intake     Starting 07/08/2018, I will continue to drink at least 6-8 glasses of water daily.        This is a list of the screening recommended for you and due dates:  Health Maintenance  Topic Date Due  . Complete foot exam   09/08/2018  . Hemoglobin A1C  01/07/2019  . Eye exam for diabetics  02/10/2019  . Tetanus Vaccine  07/22/2019  . Mammogram  04/29/2020  . Flu Shot  Completed  . DEXA scan (bone density measurement)  Completed  .  Hepatitis C: One time screening is recommended by Center for Disease Control  (CDC) for  adults born from [birth year] through 1965.   Completed  . Pneumonia vaccines  Completed   Preventive Care for Adults  A healthy lifestyle and preventive care can promote health and wellness. Preventive health guidelines for adults include the following key practices.  . A routine yearly physical is a good way to check with your health care provider about your health and preventive screening. It is a chance to share any concerns and updates on your health and to receive a thorough exam.  . Visit your dentist for a routine exam and preventive care every 6 months. Brush your teeth twice a day and floss once a day. Good oral hygiene prevents tooth decay and gum disease.  . The frequency of eye exams is based on your age, health, family medical history, use  of contact lenses, and other factors. Follow your health care provider's recommendations for frequency of eye exams.  . Eat a healthy diet. Foods like vegetables, fruits, whole grains, low-fat dairy products, and lean protein foods contain the nutrients you need without too many calories. Decrease your intake of foods high in  solid fats, added sugars, and salt. Eat the right amount of calories for you. Get information about a proper diet from your health care provider, if necessary.  . Regular physical exercise is one of the most important things you can do for your health. Most adults should get at least 150 minutes of moderate-intensity exercise (any activity that increases your heart rate and causes you to sweat) each week. In addition, most adults need muscle-strengthening exercises on 2 or more days a week.  Silver Sneakers may be a benefit available to you. To determine eligibility, you may visit the website: www.silversneakers.com or contact program at 5411814056 Mon-Fri between 8AM-8PM.   . Maintain a healthy weight. The body mass index (BMI) is a screening tool to identify possible weight problems. It provides an estimate of body fat based on height and weight. Your health care provider can find your BMI and can help you achieve or maintain a healthy weight.   For adults 20 years and older: ? A BMI below 18.5 is considered underweight. ? A BMI of 18.5 to 24.9 is normal. ? A BMI of 25 to 29.9 is considered overweight. ? A BMI of 30 and above is considered obese.   . Maintain normal blood lipids and cholesterol levels by exercising and minimizing your intake of saturated fat. Eat a balanced diet with plenty of fruit and vegetables. Blood tests  for lipids and cholesterol should begin at age 35 and be repeated every 5 years. If your lipid or cholesterol levels are high, you are over 50, or you are at high risk for heart disease, you may need your cholesterol levels checked more frequently. Ongoing high lipid and cholesterol levels should be treated with medicines if diet and exercise are not working.  . If you smoke, find out from your health care provider how to quit. If you do not use tobacco, please do not start.  . If you choose to drink alcohol, please do not consume more than 2 drinks per day. One drink  is considered to be 12 ounces (355 mL) of beer, 5 ounces (148 mL) of wine, or 1.5 ounces (44 mL) of liquor.  . If you are 82-55 years old, ask your health care provider if you should take aspirin to prevent strokes.  . Use sunscreen. Apply sunscreen liberally and repeatedly throughout the day. You should seek shade when your shadow is shorter than you. Protect yourself by wearing long sleeves, pants, a wide-brimmed hat, and sunglasses year round, whenever you are outdoors.  . Once a month, do a whole body skin exam, using a mirror to look at the skin on your back. Tell your health care provider of new moles, moles that have irregular borders, moles that are larger than a pencil eraser, or moles that have changed in shape or color.

## 2018-07-08 NOTE — Progress Notes (Signed)
PCP notes:   Health maintenance:  A1C - completed  Abnormal screenings:   None  Patient concerns:   None  Nurse concerns:  None  Next PCP appt:   07/20/18 @ 0830

## 2018-07-12 ENCOUNTER — Telehealth: Payer: Self-pay | Admitting: Family Medicine

## 2018-07-12 NOTE — Telephone Encounter (Signed)
On Donna's desk ready for pickup.

## 2018-07-12 NOTE — Telephone Encounter (Signed)
Dropped off renewal for disability placard to be filled out. I placed in Rx tower. Pt said she will pick up at 12/31 appt with Dr [name].

## 2018-07-12 NOTE — Telephone Encounter (Signed)
Completed and in outbox. 

## 2018-07-12 NOTE — Telephone Encounter (Signed)
Form given to Dr [name]

## 2018-07-20 ENCOUNTER — Ambulatory Visit (INDEPENDENT_AMBULATORY_CARE_PROVIDER_SITE_OTHER): Payer: Medicare Other | Admitting: Family Medicine

## 2018-07-20 VITALS — BP 130/70 | HR 80 | Temp 97.4°F | Ht 66.5 in | Wt 252.0 lb

## 2018-07-20 DIAGNOSIS — E1169 Type 2 diabetes mellitus with other specified complication: Secondary | ICD-10-CM | POA: Diagnosis not present

## 2018-07-20 DIAGNOSIS — Z0000 Encounter for general adult medical examination without abnormal findings: Secondary | ICD-10-CM

## 2018-07-20 DIAGNOSIS — I10 Essential (primary) hypertension: Secondary | ICD-10-CM

## 2018-07-20 DIAGNOSIS — E1165 Type 2 diabetes mellitus with hyperglycemia: Secondary | ICD-10-CM | POA: Insufficient documentation

## 2018-07-20 DIAGNOSIS — J84112 Idiopathic pulmonary fibrosis: Secondary | ICD-10-CM | POA: Diagnosis not present

## 2018-07-20 DIAGNOSIS — E039 Hypothyroidism, unspecified: Secondary | ICD-10-CM | POA: Diagnosis not present

## 2018-07-20 DIAGNOSIS — F3340 Major depressive disorder, recurrent, in remission, unspecified: Secondary | ICD-10-CM

## 2018-07-20 DIAGNOSIS — I152 Hypertension secondary to endocrine disorders: Secondary | ICD-10-CM

## 2018-07-20 DIAGNOSIS — E1159 Type 2 diabetes mellitus with other circulatory complications: Secondary | ICD-10-CM

## 2018-07-20 DIAGNOSIS — E118 Type 2 diabetes mellitus with unspecified complications: Secondary | ICD-10-CM | POA: Insufficient documentation

## 2018-07-20 DIAGNOSIS — J9611 Chronic respiratory failure with hypoxia: Secondary | ICD-10-CM

## 2018-07-20 DIAGNOSIS — E119 Type 2 diabetes mellitus without complications: Secondary | ICD-10-CM | POA: Insufficient documentation

## 2018-07-20 DIAGNOSIS — E785 Hyperlipidemia, unspecified: Secondary | ICD-10-CM

## 2018-07-20 DIAGNOSIS — G4733 Obstructive sleep apnea (adult) (pediatric): Secondary | ICD-10-CM

## 2018-07-20 LAB — HM DIABETES FOOT EXAM

## 2018-07-20 MED ORDER — METFORMIN HCL ER 500 MG PO TB24: 1500.0000 mg | ORAL_TABLET | Freq: Every day | ORAL | 3 refills | Status: DC

## 2018-07-20 NOTE — Assessment & Plan Note (Signed)
On continuous oxygen 

## 2018-07-20 NOTE — Progress Notes (Signed)
Subjective:    Patient ID: Kaitlin Hamilton, female    DOB: [birth year], 67 y.o.   MRN: [id number]  HPI The patient presents for complete physical and review of chronic health problems. He/She also has the following acute concerns today:  The patient saw Candis Musa, LPN for medicare wellness. Note reviewed in detail and important notes copied below.  Health maintenance:  A1C - completed  Abnormal screenings:   None   Today;  Improved diverticulitis.Marland Kitchen completed antibiotics. NO abd pain.   Chronic lung issues ( UIP) followed by pulmonary. Increased Esbriet in last 2 months  was on prednisone.  Diabetes:   Inadequate control on max actos, onglyza and  Max metformin.  Sugar was up on the prednisone 10 days in  October.. She has been on 8 week diet.. challenge diet along with firends, no sweets etc. exercise challenge etc.  Onglyza after Feb will be 1000$ too costly. Lab Results  Component Value Date   HGBA1C 8.2 (H) 07/08/2018  Using medications without difficulties: Hypoglycemic episodes: none Hyperglycemic episodes: yes Feet problems: no ulcers Blood Sugars averaging: FBS  164-233 average 195. eye exam within last year:01/2018  GFR 88  Has lost  10 lbs in last 6 months. Wt Readings from Last 3 Encounters:  07/20/18 252 lb (114.3 kg)  07/08/18 253 lb 8 oz (115 kg)  06/30/18 254 lb 12 oz (115.6 kg)     Elevated Cholesterol:  LDL  Almost at goal on  pravachol Lab Results  Component Value Date   CHOL 188 07/08/2018   HDL 70.80 07/08/2018   LDLCALC 87 07/08/2018   LDLDIRECT 104.0 09/04/2017   TRIG 153.0 (H) 07/08/2018   CHOLHDL 3 07/08/2018   Using medications without problems: Muscle aches:  Diet compliance:good Exercise: working on it Other complaints:  Hypertension:   Good control on losartan HCTZ  BP Readings from Last 3 Encounters:  07/20/18 130/70  07/08/18 122/76  06/30/18 122/64  Using medication without problems or lightheadedness: none Chest  pain with exertion: none Edema:none Short of breath: yes Average home BPs: Other issues:   OSA:  CPAP  Hypothyroidism: Due for re-eval. Lab Results  Component Value Date   TSH 2.73 01/27/2017   MDD:  Stable control on no medication. Depression screen [facility] 2/9 07/08/2018 06/02/2017 01/30/2017  Decreased Interest 0 0 1  Down, Depressed, Hopeless 0 0 1  PHQ - 2 Score 0 0 2  Altered sleeping 0 0 1  Tired, decreased energy 0 0 1  Change in appetite 0 0 1  Feeling bad or failure about yourself  0 0 0  Trouble concentrating 0 0 0  Moving slowly or fidgety/restless 0 0 0  Suicidal thoughts 0 0 0  PHQ-9 Score 0 0 5  Difficult doing work/chores Not difficult at all Not difficult at all Not difficult at all  Some recent data might be hidden    Social History /Family History/Past Medical History reviewed in detail and updated in EMR if needed. Height 5' 6.5" (1.689 m).   Review of Systems  Constitutional: Positive for fatigue. Negative for fever.  HENT: Negative for congestion.   Eyes: Negative for pain.  Respiratory: Positive for shortness of breath. Negative for cough.   Cardiovascular: Negative for chest pain, palpitations and leg swelling.  Gastrointestinal: Negative for abdominal pain.  Genitourinary: Negative for dysuria and vaginal bleeding.  Musculoskeletal: Negative for back pain.  Neurological: Negative for syncope, light-headedness and headaches.  Psychiatric/Behavioral: Negative for dysphoric mood.  Objective:   Physical Exam Constitutional:      General: She is not in acute distress.    Appearance: Normal appearance. She is well-developed. She is not ill-appearing or toxic-appearing.     Comments: Overweight   HENT:     Head: Normocephalic.     Right Ear: Hearing, ear canal and external ear normal. Tympanic membrane is not erythematous, retracted or bulging.     Left Ear: Hearing, tympanic membrane, ear canal and external ear normal. Tympanic membrane is  not erythematous, retracted or bulging.     Nose: No mucosal edema or rhinorrhea.     Right Sinus: No maxillary sinus tenderness or frontal sinus tenderness.     Left Sinus: No maxillary sinus tenderness or frontal sinus tenderness.     Mouth/Throat:     Pharynx: Uvula midline.  Eyes:     General: Lids are normal. Lids are everted, no foreign bodies appreciated.     Conjunctiva/sclera: Conjunctivae normal.     Pupils: Pupils are equal, round, and reactive to light.  Neck:     Musculoskeletal: Normal range of motion and neck supple.     Thyroid: No thyroid mass or thyromegaly.     Vascular: No carotid bruit.     Trachea: Trachea normal.  Cardiovascular:     Rate and Rhythm: Normal rate and regular rhythm.     Pulses: Normal pulses.     Heart sounds: Normal heart sounds, S1 normal and S2 normal. No murmur. No friction rub. No gallop.   Pulmonary:     Effort: Pulmonary effort is normal. No tachypnea or respiratory distress.     Breath sounds: Examination of the right-lower field reveals rales. Examination of the left-lower field reveals rales. Rales present. No decreased breath sounds, wheezing or rhonchi.  Abdominal:     General: Bowel sounds are normal.     Palpations: Abdomen is soft.     Tenderness: There is no abdominal tenderness.  Skin:    General: Skin is warm and dry.     Findings: No rash.  Neurological:     Mental Status: She is alert.  Psychiatric:        Mood and Affect: Mood is not anxious or depressed.        Speech: Speech normal.        Behavior: Behavior normal. Behavior is cooperative.        Thought Content: Thought content normal.        Judgment: Judgment normal.      Diabetic foot exam: Normal inspection No skin breakdown No calluses  Normal DP pulses Normal sensation to light touch and monofilament Nails normal      Assessment & Plan:  The patient's preventative maintenance and recommended screening tests for an annual wellness exam were  reviewed in full today. Brought up to date unless services declined.  Counselled on the importance of diet, exercise, and its role in overall health and mortality. The patient's FH and SH was reviewed, including their home life, tobacco status, and drug and alcohol status.   Vaccines: uptodate Colon: 2014, Dr. Hilarie Fredrickson, sessile polyps, recommended repeat in 3 year. Pt refused given sedation needed. PAP/DVE: Pap not indicated. Daughter with ovarian cancer. Nonsmoker HIV: refused mammo: nml 04/2018

## 2018-07-20 NOTE — Assessment & Plan Note (Signed)
ON higher dose of Esbriet.

## 2018-07-20 NOTE — Assessment & Plan Note (Addendum)
Inadeqiuate control but she is working on on diet and increasing activity.. she ahs lost 10 lbs.  We will increase metformin to 3 tabs in AM... will need to D/C onglyza for cost when completed in February. She will look into changing to  farxiga or other SGLt2 or GLP1i

## 2018-07-20 NOTE — Assessment & Plan Note (Signed)
LDL  Almost at goal on statin LDL goal < 70

## 2018-07-20 NOTE — Assessment & Plan Note (Signed)
Stable on CPAP 

## 2018-07-20 NOTE — Patient Instructions (Addendum)
We will plan on changing onglya to a different med for effective as well as cost... look into canagliflozin, dapagliflozin and empaglifozin... or semaglutide.  For now increase metformin 3 tablets in morning.   Call if any side effects.Marland Kitchen

## 2018-07-20 NOTE — Assessment & Plan Note (Signed)
Due for re-eval next check.

## 2018-07-20 NOTE — Assessment & Plan Note (Signed)
Well controlled. Continue current medication.  

## 2018-07-20 NOTE — Assessment & Plan Note (Signed)
Good control on no med. 

## 2018-07-27 ENCOUNTER — Telehealth: Payer: Self-pay | Admitting: Family Medicine

## 2018-07-27 ENCOUNTER — Encounter: Payer: Self-pay | Admitting: Pulmonary Disease

## 2018-07-27 ENCOUNTER — Ambulatory Visit: Payer: Medicare Other | Admitting: Pulmonary Disease

## 2018-07-27 VITALS — BP 122/70 | HR 77 | Ht 66.5 in | Wt 245.0 lb

## 2018-07-27 DIAGNOSIS — J9611 Chronic respiratory failure with hypoxia: Secondary | ICD-10-CM | POA: Diagnosis not present

## 2018-07-27 DIAGNOSIS — J84112 Idiopathic pulmonary fibrosis: Secondary | ICD-10-CM | POA: Diagnosis not present

## 2018-07-27 DIAGNOSIS — J849 Interstitial pulmonary disease, unspecified: Secondary | ICD-10-CM | POA: Diagnosis not present

## 2018-07-27 DIAGNOSIS — Z5181 Encounter for therapeutic drug level monitoring: Secondary | ICD-10-CM | POA: Diagnosis not present

## 2018-07-27 DIAGNOSIS — G4733 Obstructive sleep apnea (adult) (pediatric): Secondary | ICD-10-CM | POA: Diagnosis not present

## 2018-07-27 LAB — COMPREHENSIVE METABOLIC PANEL
ALBUMIN: 4.5 g/dL (ref 3.5–5.2)
ALK PHOS: 73 U/L (ref 39–117)
ALT: 11 U/L (ref 0–35)
AST: 17 U/L (ref 0–37)
BILIRUBIN TOTAL: 0.3 mg/dL (ref 0.2–1.2)
BUN: 12 mg/dL (ref 6–23)
CALCIUM: 10.4 mg/dL (ref 8.4–10.5)
CO2: 32 mEq/L (ref 19–32)
CREATININE: 0.72 mg/dL (ref 0.40–1.20)
Chloride: 94 mEq/L — ABNORMAL LOW (ref 96–112)
GFR: 85.7 mL/min (ref >60.00)
Glucose, Bld: 145 mg/dL — ABNORMAL HIGH (ref 70–99)
Potassium: 4.3 mEq/L (ref 3.5–5.1)
Sodium: 135 mEq/L (ref 135–145)
Total Protein: 7.5 g/dL (ref 6.0–8.3)

## 2018-07-27 LAB — CBC WITH DIFFERENTIAL/PLATELET
BASOS ABS: 0.1 10*3/uL (ref 0.0–0.1)
Basophils Relative: 0.7 % (ref 0.0–3.0)
EOS ABS: 0.1 10*3/uL (ref 0.0–0.7)
Eosinophils Relative: 1.6 % (ref 0.0–5.0)
HCT: 38.4 % (ref 36.0–46.0)
Hemoglobin: 12.8 g/dL (ref 12.0–15.0)
LYMPHS PCT: 18.8 % (ref 12.0–46.0)
Lymphs Abs: 1.6 10*3/uL (ref 0.7–4.0)
MCHC: 33.2 g/dL (ref 30.0–36.0)
MCV: 90.8 fl (ref 78.0–100.0)
Monocytes Absolute: 0.4 10*3/uL (ref 0.1–1.0)
Monocytes Relative: 5 % (ref 3.0–12.0)
NEUTROS PCT: 73.9 % (ref 43.0–77.0)
Neutro Abs: 6.1 10*3/uL (ref 1.4–7.7)
Platelets: 276 10*3/uL (ref 150.0–400.0)
RBC: 4.23 Mil/uL (ref 3.87–5.11)
RDW: 14.7 % (ref 11.5–15.5)
WBC: 8.3 10*3/uL (ref 4.0–10.5)

## 2018-07-27 NOTE — Telephone Encounter (Signed)
Okay.. when she has run out of onglyza.Marland Kitchen we can D/C this and start invokana 100 mg daily.

## 2018-07-27 NOTE — Telephone Encounter (Signed)
Pt's daughter [name] (on dpr) called office to let Dr.Bedsole know that empadlifovin 10mg  or 25mg  is covered and the canagliflozin 60mg , 100mg , & 300mg  is covered under the insurance. Both medications are tier 3.

## 2018-07-27 NOTE — Progress Notes (Signed)
Subjective:    Patient ID: Kaitlin Hamilton, female    DOB: 09/11/1950, 68 y.o.   MRN: [id number]  Synopsis: Kaitlin Hamilton is a very pleasant 68 year old female who first saw the [facility] pulmonary clinic in March 2014 for evaluation of shortness of breath. She had significant cough, wheezing, and sputum production requiring multiple rounds of antibiotics. Because of crackles on lung exam and an abnormal pulmonary function test she was referred to [place]. We ordered full pulmonary function testing which showed moderate restriction and a depressed DLCO in proportion to her restriction. There is no airflow obstruction and no change with bronchodilator administration. A chest x-ray performed in February 2014 was read as normal.  An open lung biopsy was performed in December 2014 showing UIP.  Because she has had multiple good responses to steroids, we have treated her with cellcept and prednisone.  In January 2015 she had severe insomnia, hallucinations and gastritis on high dose prednisone.  She had a rash to bactrim. Since February 2015 she has been on cellcept and low dose prednisone and dapsone. Because of progression in hypoxemia and lung function testing in 2016 we changed her therapy to anti-fibrotic therapy with Esbriet. Because of a strong concern for an underlying connective tissue disease it was decided that she will be maintained on low-dose CellCept. In the fall 2019 she had a flare of her UIP after a bad upper respiratory infection and her lung function testing declined so we increased her mycophenolate.   HPI Chief Complaint  Patient presents with  . Follow-up    follow up after increasing mycophenolate. States her breathing has been ok since last visit.    Kaitlin Hamilton has been doing "pretty good".  She had a bad flare of diverticulitis a month ago and she had a CT scan showing diverticulitis and she took 10 days of antibiotics. She is feeling better now.  She feels her breathing is "pretty good".   She can do the laundry.  She is able to do some basic activities of daily living in her home and uses about 8 L of oxygen when she does this.  She has not been sick with bronchitis or pneumonia since last visit.  She still takes CellCept 1 g twice a day and Esbriet at current dosing as detailed in this note   Past Medical History:  Diagnosis Date  . Allergic rhinitis    uses Flonase daily  . Anesthesia complication    Per pt/ past endoscopy in 2015, the anesthetic spray in back throat caused her to have breathing problems  . Anxiety   . Bronchitis   . Chicken pox   . Constipation    takes Fiber daily  . Depression   . Diverticulosis   . Dysphagia   . GERD (gastroesophageal reflux disease)    takes Protonix daily  . H/O hiatal hernia   . Hemorrhoids   . History of bronchitis    2013  . History of colon polyps   . History of kidney stones   . Hyperlipidemia    lost 43 pounds and no meds required at present  . Hypertension    takes Verapamil and HCTZ daily  . Hypokalemia   . Hypothyroidism (acquired)    takes Synthroid daily  . Insomnia    doesn't take any meds for this  . Joint swelling    left thumb  . Migraines    last one about a month ago  . Non-insulin dependent type 2 diabetes  mellitus ([place])    takes Glipizide daily  . OA (osteoarthritis)    left knee  . Oxygen deficiency   . Oxygen dependent    Pt is on continuous O2 at 3 liters!  . Pneumonia    last time in 2006  . PONV (postoperative nausea and vomiting)   . Shortness of breath    with exertion;takes Singulair daily as well as Flonase  . Urinary frequency         Review of Systems  Constitutional: Negative for activity change, chills, fatigue and fever.  HENT: Negative for congestion, ear pain, nosebleeds, postnasal drip, rhinorrhea and sinus pressure.   Respiratory: Positive for cough and shortness of breath. Negative for wheezing.   Cardiovascular: Negative for chest pain, palpitations and leg  swelling.  Gastrointestinal: Negative for abdominal pain and nausea.       Objective:   Physical Exam  Vitals:   07/27/18 1009  BP: 122/70  Pulse: 77  SpO2: 100%  Weight: 245 lb (111.1 kg)  Height: 5' 6.5" (1.689 m)    O2 saturation 06/14/2018 93% on 4L [place]  Gen: obese, chronically ill appearing HENT: OP clear, neck supple PULM: Crackles bases B, normal percussion CV: RRR, no mgr, trace edema GI: BS+, soft, nontender Derm: no cyanosis or rash Psyche: normal mood and affect   Chest imaging: February 2014 chest [facility] at [facility] normal 11/2012 CT chest >> (Ciarra Braddy read) centrilobular nodules, interlobular septal thickening worse in bases and periphery R lung > L; some GGO in bases and periphery as well, some bronchiectasis in the bases R > L; findings suggestive of fibrosis but not UIP; question hypersensitivity pneumonitis given centrilobular nodules; also question aspiration 04/2013 CT chest ([name])> findings suggestive of but not diagnostic of NSIP, small pulmonary nodule February 2016 CT chest high resolution> slight progression in reticular abnormalities consistent with pulmonary fibrosis, uip December 2019 CT abdomen lung windows reviewed showing a new cyst in the right lower lobe and some traction bronchiectasis but essentially unchanged compared to 2016 otherwise   Labs: 03/2013 ANA, ANCA, Anti-Jo-1, ESR, RF, SCL-70, anti-centromere, SSA/SSB, all negative; CRP 0.6;  04/2018 CBC and CMET WNL with exception of hyperglycemia  Other imaing 04/2013 Barium swallow> abnormal esophageal motility, GERD, hiatal hernia   Path: 06/2013 open lung biopsy> UIP   6 minute walk 10/19/2012 walked 500 feet in office on room air oxygenation did not drop below 90% 04/2013 6MW RA > 1100 feet, HR peak 109, O2 sat [place] 87% 11/2013 6MW 1364 feet, O2 saturation nadir 78% 08/21/14 6MW 1400 feet 90% on 8L 11/2016 6MW 288M needed [facility] July 2019 6MW 336 M required 8 L nasal cannula, O2  saturation dropped to 89%,  PFT February 2014 simple spirometry performed by her primary care physician>> ratio 90%, FEV1 1.71 L (64% predicted, FVC 1.83 L 55% predicted; flow volume loop is not consistent with obstruction 11/15/2012 Full PFT [name]> Ratio 87%, FEV1 2.00L > 2.07 with bronchodilator (3% change); TLC 3.44 L (65% pred), ERV 0.62 (57% pred), DLCO 15.1 ml/mmHg/min (59% pred) 04/2013 Full PFT> Ratio 93%, FEV1 1.92 L (71% pred), TLC 3.02L (56% pred), DLCO 15.94 (59% pred) May 2015 full pulmonary function test ratio 93%, FEV1 1.71 L (64% predicted, no change with bronchodilator), total lung capacity 2.67 L (49% predicted), DLCO 13.11 (40% predicted) 03/2015 PFT > Ratio 93%, FEV1 1.24L, FVC 1.33L (38% pred), TLC 2.56L (47% pred), DLCO 12.10 (44% pred) March 2017 pulmonary function testing ratio 93%, FVC (41% predicted),  total lung capacity 2.34 L (43% protected), DLCO 10.53 (39% protected). November 2017 pulmonary function testing ratio 92%, FVC 1.26 L, 37% predicted, total lung capacity 2.37 L 44% predicted, DLCO 5. 89 21% percent predicted December 2018 lung function test FVC 1.14 L, 33% predicted, total lung capacity 2.67 L 49% predicted, DLCO 9.02 mL 33% predicted 04/2018  > forced vital capacity 1.10 L 35% predicted, DLCO 8.88 mL 36%       Assessment & Plan:  Therapeutic drug monitoring - Plan: CBC w/Diff, Comp Met (CMET)  Interstitial pulmonary disease ([place]) - Plan: CT CHEST HIGH RESOLUTION  IPF (idiopathic pulmonary fibrosis) (HCC)  Chronic hypoxemic respiratory failure (HCC)  OSA (obstructive sleep apnea)  UIP (usual interstitial pneumonitis) ([place])  Discussion: In general this is been a stable interval for [place].  While we know that her poorly defined interstitial lung disease is progressing, I do not see clear clinical evidence of progression since the last visit.  She has tolerated the immunosuppressant regimen well and is tolerating Esbriet well.  I did advise her that  if she has recurrent infections like the diverticulitis she experienced last month she needs to let me know because the CellCept does increase her risk of recurrent GI infections.  Plan: Idiopathic pulmonary fibrosis with autoimmune features: Continue CellCept 1 g twice a day We will check blood work today to make sure there is no evidence of toxicity from that: CBC with differential, comprehensive metabolic panel Continue taking Esbriet 3 times a day 6-minute walk next visit to monitor disease activity High-resolution CT scan of the chest in April  Allergic rhinitis: Continue taking Zyrtec  Gastroesophageal reflux disease Continue taking antacid therapy  Chronic respiratory failure with hypoxemia: Keep using 4 L of oxygen at rest, 10 L with heavy exertion  Obstructive sleep apnea: Keep using CPAP nightly  We will see you back in April or sooner if needed   Current Outpatient Medications:  .  acetaminophen (TYLENOL) 500 MG tablet, Take 1,000 mg by mouth every 6 (six) hours as needed for moderate pain., Disp: , Rfl:  .  diphenhydrAMINE (BENADRYL) 25 mg capsule, Take 25 mg by mouth as needed., Disp: , Rfl:  .  ESBRIET 801 MG TABS, TAKE 801MG BY MOUTH 3 TIMES A DAY WITH FOOD, Disp: 90 tablet, Rfl: 11 .  FIBER PO, Take 1 tablet by mouth daily., Disp: , Rfl:  .  fluticasone (FLONASE) 50 MCG/ACT nasal spray, Place 1 spray into both nostrils daily., Disp: , Rfl:  .  glucose blood (ONE TOUCH ULTRA TEST) test strip, CHECK BLOOD SUGAR DAILY., Disp: 100 each, Rfl: 3 .  levothyroxine (SYNTHROID, LEVOTHROID) 125 MCG tablet, TAKE 1 TABLET BY MOUTH  DAILY BEFORE BREAKFAST, Disp: 90 tablet, Rfl: 0 .  losartan-hydrochlorothiazide (HYZAAR) 50-12.5 MG tablet, Take 1 tablet by mouth daily., Disp: 90 tablet, Rfl: 1 .  metFORMIN (GLUCOPHAGE-XR) 500 MG 24 hr tablet, Take 3 tablets (1,500 mg total) by mouth daily with breakfast., Disp: 270 tablet, Rfl: 3 .  montelukast (SINGULAIR) 10 MG tablet, Take 1  tablet (10 mg total) by mouth at bedtime., Disp: 90 tablet, Rfl: 3 .  mycophenolate (CELLCEPT) 500 MG tablet, Take 2 tablets (1,000 mg total) by mouth 2 (two) times daily., Disp: 120 tablet, Rfl: 3 .  NON FORMULARY, Place 4 L into the nose daily. 10 Liters with exertion, Disp: , Rfl:  .  nystatin cream (MYCOSTATIN), Apply 1 application topically 3 (three) times daily., Disp: 30 g, Rfl: 0 .  pantoprazole (PROTONIX) 40 MG tablet, Take 1 tablet (40 mg total) by mouth 2 (two) times daily., Disp: 180 tablet, Rfl: 1 .  pioglitazone (ACTOS) 45 MG tablet, Take 1 tablet (45 mg total) by mouth daily., Disp: 90 tablet, Rfl: 3 .  pravastatin (PRAVACHOL) 40 MG tablet, Take 1 tablet (40 mg total) by mouth daily., Disp: 90 tablet, Rfl: 2 .  saxagliptin HCl (ONGLYZA) 5 MG TABS tablet, Take 1 tablet (5 mg total) by mouth daily., Disp: 90 tablet, Rfl: 1 .  verapamil (CALAN-SR) 180 MG CR tablet, Take 1 tablet (180 mg total) by mouth daily., Disp: 90 tablet, Rfl: 1

## 2018-07-27 NOTE — Patient Instructions (Signed)
Idiopathic pulmonary fibrosis with autoimmune features: Continue CellCept 1 g twice a day We will check blood work today to make sure there is no evidence of toxicity from that: CBC with differential, comprehensive metabolic panel Continue taking Esbriet 3 times a day 6-minute walk next visit to monitor disease activity High-resolution CT scan of the chest in April  Allergic rhinitis: Continue taking Zyrtec  Gastroesophageal reflux disease Continue taking antacid therapy  Chronic respiratory failure with hypoxemia: Keep using 4 L of oxygen at rest, 10 L with heavy exertion  Obstructive sleep apnea: Keep using CPAP nightly  We will see you back in April or sooner if needed

## 2018-07-27 NOTE — Telephone Encounter (Signed)
Left message for Ms. Fessenden that when she runs out of onglyza she can stop that and start Invokana 100 mg daily per Dr. [name].

## 2018-08-04 DIAGNOSIS — J8489 Other specified interstitial pulmonary diseases: Secondary | ICD-10-CM | POA: Diagnosis not present

## 2018-08-16 ENCOUNTER — Ambulatory Visit (INDEPENDENT_AMBULATORY_CARE_PROVIDER_SITE_OTHER)
Admission: RE | Admit: 2018-08-16 | Discharge: 2018-08-16 | Disposition: A | Discharge status: Home / Self Care | Payer: Medicare Other | Source: Ambulatory Visit | Attending: Family Medicine | Admitting: Family Medicine

## 2018-08-16 ENCOUNTER — Ambulatory Visit (INDEPENDENT_AMBULATORY_CARE_PROVIDER_SITE_OTHER): Payer: Medicare Other | Admitting: Family Medicine

## 2018-08-16 ENCOUNTER — Encounter: Payer: Self-pay | Admitting: Family Medicine

## 2018-08-16 VITALS — BP 134/56 | HR 94 | Temp 97.8°F | Ht 66.5 in | Wt 244.0 lb

## 2018-08-16 DIAGNOSIS — R350 Frequency of micturition: Secondary | ICD-10-CM

## 2018-08-16 DIAGNOSIS — R1032 Left lower quadrant pain: Secondary | ICD-10-CM

## 2018-08-16 DIAGNOSIS — E1165 Type 2 diabetes mellitus with hyperglycemia: Secondary | ICD-10-CM

## 2018-08-16 DIAGNOSIS — J84112 Idiopathic pulmonary fibrosis: Secondary | ICD-10-CM | POA: Diagnosis not present

## 2018-08-16 DIAGNOSIS — K5792 Diverticulitis of intestine, part unspecified, without perforation or abscess without bleeding: Secondary | ICD-10-CM | POA: Insufficient documentation

## 2018-08-16 LAB — POC URINALSYSI DIPSTICK (AUTOMATED)
BILIRUBIN UA: NEGATIVE
Blood, UA: NEGATIVE
Glucose, UA: NEGATIVE
Ketones, UA: NEGATIVE
LEUKOCYTES UA: NEGATIVE
NITRITE UA: NEGATIVE
Protein, UA: POSITIVE — AB
Spec Grav, UA: 1.02 (ref 1.010–1.025)
Urobilinogen, UA: 0.2 E.U./dL
pH, UA: 6 (ref 5.0–8.0)

## 2018-08-16 MED ORDER — AMOXICILLIN-POT CLAVULANATE 875-125 MG PO TABS: 1.0000 | ORAL_TABLET | Freq: Two times a day (BID) | ORAL | 0 refills | Status: AC

## 2018-08-16 NOTE — Assessment & Plan Note (Addendum)
Suspicious for early diverticulitis, r/o nephrolithiasis or UTI. UA without LE or blood. Check KUB as well as labwork (CBC, CMP, lipase). Start empiric augmentin course. Discussed bland low fiber diet until feeling better. Update with effect.  Noted CT findings 06/2018 with recommendation to repeat CT once resolved. Will cc PCP as fyi.

## 2018-08-16 NOTE — Progress Notes (Signed)
BP (!) 134/56 (BP Location: Left Arm, Patient Position: Sitting, Cuff Size: Large)   Pulse 94   Temp 97.8 F (36.6 C) (Oral)   Ht 5' 6.5" (1.689 m)   Wt 244 lb (110.7 kg)   SpO2 98% Comment: 4 L, continuous  BMI 38.79 kg/m    CC: L side pain Subjective:    Patient ID: [name], female    DOB: February 03, 1951, 68 y.o.   MRN: [id number]  HPI: LYSSA HACKLEY is a 68 y.o. female presenting on 08/16/2018 for Abdominal Pain (C/o LLQ abd pain. Also, c/o urinary frequency and itching with urination. Started 09/14/18. ) and Dizziness (C/o dizziness that started yesterday. Occurs when pt bends forward. Thinks it may be sinus related. )   2d h/o LLQ abdominal pain described as sharp intermittent stabbing. No new skin rashes. Some itching and increased urinary frequency over last few days. Some diarrhea (metformin XR dose recently increased). She had diverticulitis several months ago, but current symptoms aren't as severe. Last BM yesterday, passing gas ok.   She also endorses increasing dizziness that started yesterday. Dizziness described as room spinning vertigo. No syncope. She fell into her lift chair when getting up today. Denies injury from fall. Some sinus pressure headache and facial pain. No vision changes, unilateral facial or body numbness or weakness, slurred speech. She did have R sided tinnitus a few days ago. + rhinorrhea. No increase cough or dyspnea.   No fevers/chills, nausea/vomiting, constipation, no blood in stool.  She has recently increased fiber in diet.  Feels like prior kidney stones.   UIP on chronic supplemental O2 4-6 L [place] and on cellcept.   Colonoscopy 05/2013 - TA, severe diverticulosis, rpt 3 yrs ([place]). Pt has previously declined due to concerns over sedation requirement.      Relevant past medical, surgical, family and social history reviewed and updated as indicated. Interim medical history since our last visit reviewed. Allergies and medications reviewed and  updated. Outpatient Medications Prior to Visit  Medication Sig Dispense Refill  . acetaminophen (TYLENOL) 500 MG tablet Take 1,000 mg by mouth every 6 (six) hours as needed for moderate pain.    . diphenhydrAMINE (BENADRYL) 25 mg capsule Take 25 mg by mouth as needed.    . ESBRIET 801 MG TABS TAKE 801MG  BY MOUTH 3 TIMES A DAY WITH FOOD 90 tablet 11  . FIBER PO Take 1 tablet by mouth daily.    . fluticasone (FLONASE) 50 MCG/ACT nasal spray Place 1 spray into both nostrils daily.    Marland Kitchen glucose blood (ONE TOUCH ULTRA TEST) test strip CHECK BLOOD SUGAR DAILY. 100 each 3  . levothyroxine (SYNTHROID, LEVOTHROID) 125 MCG tablet TAKE 1 TABLET BY MOUTH  DAILY BEFORE BREAKFAST 90 tablet 0  . losartan-hydrochlorothiazide (HYZAAR) 50-12.5 MG tablet Take 1 tablet by mouth daily. 90 tablet 1  . metFORMIN (GLUCOPHAGE-XR) 500 MG 24 hr tablet Take 3 tablets (1,500 mg total) by mouth daily with breakfast. 270 tablet 3  . montelukast (SINGULAIR) 10 MG tablet Take 1 tablet (10 mg total) by mouth at bedtime. 90 tablet 3  . mycophenolate (CELLCEPT) 500 MG tablet Take 2 tablets (1,000 mg total) by mouth 2 (two) times daily. 120 tablet 3  . NON FORMULARY Place 4 L into the nose daily. 10 Liters with exertion    . nystatin cream (MYCOSTATIN) Apply 1 application topically 3 (three) times daily. 30 g 0  . pantoprazole (PROTONIX) 40 MG tablet Take 1 tablet (40  mg total) by mouth 2 (two) times daily. 180 tablet 1  . pioglitazone (ACTOS) 45 MG tablet Take 1 tablet (45 mg total) by mouth daily. 90 tablet 3  . pravastatin (PRAVACHOL) 40 MG tablet Take 1 tablet (40 mg total) by mouth daily. 90 tablet 2  . saxagliptin HCl (ONGLYZA) 5 MG TABS tablet Take 1 tablet (5 mg total) by mouth daily. 90 tablet 1  . verapamil (CALAN-SR) 180 MG CR tablet Take 1 tablet (180 mg total) by mouth daily. 90 tablet 1   No facility-administered medications prior to visit.      Per HPI unless specifically indicated in ROS section below Review of  Systems Objective:    BP (!) 134/56 (BP Location: Left Arm, Patient Position: Sitting, Cuff Size: Large)   Pulse 94   Temp 97.8 F (36.6 C) (Oral)   Ht 5' 6.5" (1.689 m)   Wt 244 lb (110.7 kg)   SpO2 98% Comment: 4 L, continuous  BMI 38.79 kg/m   Wt Readings from Last 3 Encounters:  08/16/18 244 lb (110.7 kg)  07/27/18 245 lb (111.1 kg)  07/20/18 252 lb (114.3 kg)    Physical Exam Vitals signs and nursing note reviewed.  Constitutional:      General: She is not in acute distress.    Appearance: Normal appearance. She is well-developed. She is not ill-appearing.     Comments: Uncomfortable appearing  HENT:     Head: Normocephalic and atraumatic.     Right Ear: Hearing, tympanic membrane, ear canal and external ear normal.     Left Ear: Hearing, tympanic membrane, ear canal and external ear normal.     Nose: Congestion and rhinorrhea present. No mucosal edema.     Right Sinus: No maxillary sinus tenderness or frontal sinus tenderness.     Left Sinus: No maxillary sinus tenderness or frontal sinus tenderness.     Comments: Tice in place    Mouth/Throat:     Pharynx: Oropharynx is clear. Uvula midline. No oropharyngeal exudate or posterior oropharyngeal erythema.     Tonsils: No tonsillar abscesses.     Comments: milldy dry mm Eyes:     General: No scleral icterus.    Conjunctiva/sclera: Conjunctivae normal.     Pupils: Pupils are equal, round, and reactive to light.  Neck:     Musculoskeletal: Normal range of motion and neck supple.  Cardiovascular:     Rate and Rhythm: Normal rate and regular rhythm.     Pulses: Normal pulses.     Heart sounds: Normal heart sounds. No murmur.  Pulmonary:     Effort: Pulmonary effort is normal. No respiratory distress.     Breath sounds: Normal breath sounds. No wheezing, rhonchi or rales.     Comments: Coarse crackles throughout Abdominal:     General: Abdomen is flat. Bowel sounds are normal. There is no distension.     Palpations:  Abdomen is soft. There is no mass.     Tenderness: There is abdominal tenderness (moderate) in the left lower quadrant. There is no right CVA tenderness, left CVA tenderness, guarding or rebound. Negative signs include Murphy's sign.     Hernia: No hernia is present.  Musculoskeletal:     Right lower leg: No edema.     Left lower leg: No edema.  Lymphadenopathy:     Cervical: No cervical adenopathy.  Skin:    General: Skin is warm and dry.     Findings: No rash.  Neurological:  Mental Status: She is alert.  Psychiatric:        Mood and Affect: Mood normal.       Results for orders placed or performed in visit on 08/16/18  POCT Urinalysis Dipstick (Automated)  Result Value Ref Range   Color, UA yellow    Clarity, UA clear    Glucose, UA Negative Negative   Bilirubin, UA negative    Ketones, UA negative    Spec Grav, UA 1.020 1.010 - 1.025   Blood, UA negative    pH, UA 6.0 5.0 - 8.0   Protein, UA Positive (A) Negative   Urobilinogen, UA 0.2 0.2 or 1.0 E.U./dL   Nitrite, UA negative    Leukocytes, UA Negative Negative   CT Abdomen Pelvis W Contrast CLINICAL DATA:  68 year old female with left lower quadrant abdominal pain for 2 days, history of diverticulosis. History of Usual interstitial pneumonitis (UIP).  EXAM: CT ABDOMEN AND PELVIS WITH CONTRAST  TECHNIQUE: Multidetector CT imaging of the abdomen and pelvis was performed using the standard protocol following bolus administration of intravenous contrast.  CONTRAST:  156mL ISOVUE-300 IOPAMIDOL (ISOVUE-300) INJECTION 61%  COMPARISON:  Noncontrast CT Abdomen and Pelvis 04/27/2014 and earlier.  FINDINGS: Lower chest: Chronic lung disease with reticular increased opacity and abnormal bilateral parenchymal ground-glass opacity. Postoperative versus calcified postinflammatory changes in the posterior basal segment of the right lower lobe. No pleural effusion. Stable cardiomegaly. No pericardial  effusion.  Hepatobiliary: Surgically absent gallbladder. Borderline to mild hepatic steatosis.  Pancreas: Negative.  Spleen: Negative.  Adrenals/Urinary Tract: Adrenal glands remain within normal limits.  Left renal upper pole cyst with simple fluid density. Chronic punctate nephrolithiasis. No hydronephrosis or hydroureter.  Diminutive and unremarkable urinary bladder.  Stomach/Bowel: Negative rectum aside from retained stool.  Redundant sigmoid colon with moderate to severe diverticulosis. Oral contrast has not yet reached the sigmoid. Oral contrast has reached the distal descending colon. In the mid descending there is diverticulosis along with short 4-5 centimeter segment of focal bowel wall thickening and regional mesenteric stranding (coronal images 93, 84). No extraluminal gas or fluid.  Diverticulosis continues into the transverse colon with no active inflammation. Negative right colon and terminal ileum. Surgically absent appendix.  No dilated or abnormal small bowel. Small gastric hiatal hernia. Negative intraabdominal stomach. No free air, free fluid.  Vascular/Lymphatic: Major arterial structures are patent with mild calcified atherosclerosis. Portal venous system is patent.  No lymphadenopathy.  Reproductive: Negative.  Other: No pelvic free fluid.  Musculoskeletal: No acute osseous abnormality identified. Mild dextroconvex lumbar scoliosis.  IMPRESSION: 1. Findings compatible with acute diverticulitis of the mid descending colon, however, follow-up is recommended to exclude neoplasm which can occasionally have a similar appearance and presentation. No abscess or complicating features. 2. No other acute or inflammatory process identified in the abdomen or pelvis. 3. Nephrolithiasis. Chronic interstitial lung disease.  Electronically Signed   By: Genevie Ann M.D.   On: 06/28/2018 14:42   Assessment & Plan:   Problem List Items Addressed This Visit     Uncontrolled type 2 diabetes mellitus with hyperglycemia ([place])   UIP (usual interstitial pneumonitis) (HCC)   LLQ abdominal pain - Primary    Suspicious for early diverticulitis, r/o nephrolithiasis or UTI. UA without LE or blood. Check KUB as well as labwork (CBC, CMP, lipase). Start empiric augmentin course. Discussed bland low fiber diet until feeling better. Update with effect.  Noted CT findings 06/2018 with recommendation to repeat CT once resolved. Will cc PCP as  fyi.       Relevant Orders   DG Abd 1 View   Comprehensive metabolic panel   CBC with Differential/Platelet   Lipase    Other Visit Diagnoses    Urinary frequency       Relevant Orders   POCT Urinalysis Dipstick (Automated) (Completed)       Meds ordered this encounter  Medications  . amoxicillin-clavulanate (AUGMENTIN) 875-125 MG tablet    Sig: Take 1 tablet by mouth 2 (two) times daily for 10 days.    Dispense:  20 tablet    Refill:  0   Orders Placed This Encounter  Procedures  . DG Abd 1 View    Standing Status:   Future    Number of Occurrences:   1    Standing Expiration Date:   10/15/2019    Order Specific Question:   Reason for Exam (SYMPTOM  OR DIAGNOSIS REQUIRED)    Answer:   LLQ abd pain r/o kidney stone    Order Specific Question:   Preferred imaging location?    Answer:   [facility]    Order Specific Question:   Radiology Contrast Protocol - do NOT remove file path    Answer:   [email or web address]  . Comprehensive metabolic panel  . CBC with Differential/Platelet  . Lipase  . POCT Urinalysis Dipstick (Automated)    Follow up plan: No follow-ups on file.  Ria Bush, MD

## 2018-08-16 NOTE — Patient Instructions (Signed)
Abdominal xray to check for kidney stone today.  Labs today. Possible early diverticulitis flare - treat with augmentin antibiotic. Low fiber bland diet, chicken broth, jello over next few days.

## 2018-08-17 ENCOUNTER — Telehealth: Payer: Self-pay | Admitting: Pulmonary Disease

## 2018-08-17 LAB — COMPREHENSIVE METABOLIC PANEL
ALBUMIN: 4.3 g/dL (ref 3.5–5.2)
ALT: 10 U/L (ref 0–35)
AST: 12 U/L (ref 0–37)
Alkaline Phosphatase: 68 U/L (ref 39–117)
BUN: 10 mg/dL (ref 6–23)
CALCIUM: 9.9 mg/dL (ref 8.4–10.5)
CHLORIDE: 94 meq/L — AB (ref 96–112)
CO2: 32 mEq/L (ref 19–32)
CREATININE: 0.63 mg/dL (ref 0.40–1.20)
GFR: 94.05 mL/min (ref >60.00)
Glucose, Bld: 148 mg/dL — ABNORMAL HIGH (ref 70–99)
Potassium: 3.4 mEq/L — ABNORMAL LOW (ref 3.5–5.1)
Sodium: 136 mEq/L (ref 135–145)
Total Bilirubin: 0.4 mg/dL (ref 0.2–1.2)
Total Protein: 7.4 g/dL (ref 6.0–8.3)

## 2018-08-17 LAB — CBC WITH DIFFERENTIAL/PLATELET
BASOS ABS: 0.1 10*3/uL (ref 0.0–0.1)
BASOS PCT: 0.7 % (ref 0.0–3.0)
EOS ABS: 0.1 10*3/uL (ref 0.0–0.7)
Eosinophils Relative: 0.7 % (ref 0.0–5.0)
HEMATOCRIT: 38.6 % (ref 36.0–46.0)
HEMOGLOBIN: 12.7 g/dL (ref 12.0–15.0)
LYMPHS PCT: 10.4 % — AB (ref 12.0–46.0)
Lymphs Abs: 1.2 10*3/uL (ref 0.7–4.0)
MCHC: 33 g/dL (ref 30.0–36.0)
MCV: 91.3 fl (ref 78.0–100.0)
MONOS PCT: 6.7 % (ref 3.0–12.0)
Monocytes Absolute: 0.8 10*3/uL (ref 0.1–1.0)
Neutro Abs: 9.2 10*3/uL — ABNORMAL HIGH (ref 1.4–7.7)
Neutrophils Relative %: 81.5 % — ABNORMAL HIGH (ref 43.0–77.0)
Platelets: 237 10*3/uL (ref 150.0–400.0)
RBC: 4.22 Mil/uL (ref 3.87–5.11)
RDW: 14.7 % (ref 11.5–15.5)
WBC: 11.3 10*3/uL — AB (ref 4.0–10.5)

## 2018-08-17 LAB — LIPASE: Lipase: 7 U/L — ABNORMAL LOW (ref 11.0–59.0)

## 2018-08-17 NOTE — Telephone Encounter (Signed)
Patient's daughter Donella Stade returning phone call.  Phone number is [phone].

## 2018-08-17 NOTE — Telephone Encounter (Signed)
ATC pt's daughter, Donella Stade. Call went straight to voicemail. LMTCB x1 for pt's daughter.

## 2018-08-17 NOTE — Telephone Encounter (Signed)
Spoke with pt's daughter, Donella Stade. She wanted to call and give BQ an update on the pt. Due to the pt being on [employer], BQ wanted to be made aware if the pt had to see her PCP for anything other than routine care per [name]. [name] states that the pt was recently seen for what her PCP is calling diverticulitis, a sinus infection and 2 episodes of vertigo. Per the pt's daughter, she is doing well at the time.  BQ - please advise if anything further is needed. Thanks!

## 2018-08-18 NOTE — Telephone Encounter (Signed)
Called and spoke with Patients Daughter, [name].  BQ's recommendation given. Understanding stated. Nothing further at this time.

## 2018-08-18 NOTE — Telephone Encounter (Signed)
If she is well I don't recommend any changes.  I hope she feels better soon.

## 2018-09-04 DIAGNOSIS — J8489 Other specified interstitial pulmonary diseases: Secondary | ICD-10-CM | POA: Diagnosis not present

## 2018-09-11 ENCOUNTER — Other Ambulatory Visit: Payer: Self-pay | Admitting: Family Medicine

## 2018-09-20 ENCOUNTER — Other Ambulatory Visit: Payer: Self-pay

## 2018-09-20 MED ORDER — PIRFENIDONE 801 MG PO TABS: 801.0000 mg | ORAL_TABLET | Freq: Three times a day (TID) | ORAL | 11 refills | Status: DC

## 2018-09-21 ENCOUNTER — Telehealth: Payer: Self-pay | Admitting: Family Medicine

## 2018-09-21 MED ORDER — CANAGLIFLOZIN 100 MG PO TABS: 100.0000 mg | ORAL_TABLET | Freq: Every day | ORAL | 3 refills | Status: DC

## 2018-09-21 NOTE — Telephone Encounter (Signed)
Refill sent as requested. 

## 2018-09-21 NOTE — Telephone Encounter (Signed)
Pt need new prescription for Canagliflozin 100mg  send to [facility]

## 2018-10-03 DIAGNOSIS — J84111 Idiopathic interstitial pneumonia, not otherwise specified: Secondary | ICD-10-CM | POA: Diagnosis not present

## 2018-10-03 DIAGNOSIS — J8489 Other specified interstitial pulmonary diseases: Secondary | ICD-10-CM | POA: Diagnosis not present

## 2018-10-03 DIAGNOSIS — G4733 Obstructive sleep apnea (adult) (pediatric): Secondary | ICD-10-CM | POA: Diagnosis not present

## 2018-10-05 ENCOUNTER — Telehealth: Payer: Self-pay | Admitting: Pulmonary Disease

## 2018-10-05 MED ORDER — MYCOPHENOLATE MOFETIL 500 MG PO TABS: 1000.0000 mg | ORAL_TABLET | Freq: Two times a day (BID) | ORAL | 3 refills | Status: DC

## 2018-10-05 MED ORDER — PIRFENIDONE 801 MG PO TABS: 801.0000 mg | ORAL_TABLET | Freq: Three times a day (TID) | ORAL | 6 refills | Status: DC

## 2018-10-05 NOTE — Telephone Encounter (Signed)
Called and spoke with patient she stated that she received a phone call from [facility] stating that she needed to go ahead and get refills of her medication to have for the next month incase it gets bad with the [place] going on supply gets scarce. Refills sent. Nothing further needed.

## 2018-10-15 ENCOUNTER — Other Ambulatory Visit: Payer: Medicare Other

## 2018-10-19 ENCOUNTER — Ambulatory Visit: Payer: Medicare Other | Admitting: Family Medicine

## 2018-10-27 ENCOUNTER — Ambulatory Visit: Payer: Medicare Other

## 2018-10-27 DIAGNOSIS — G4733 Obstructive sleep apnea (adult) (pediatric): Secondary | ICD-10-CM | POA: Diagnosis not present

## 2018-11-02 ENCOUNTER — Ambulatory Visit: Payer: Medicare Other

## 2018-11-02 ENCOUNTER — Ambulatory Visit: Payer: Medicare Other | Admitting: Pulmonary Disease

## 2018-11-02 ENCOUNTER — Telehealth: Payer: Self-pay | Admitting: Family Medicine

## 2018-11-02 DIAGNOSIS — E1165 Type 2 diabetes mellitus with hyperglycemia: Secondary | ICD-10-CM

## 2018-11-02 DIAGNOSIS — E039 Hypothyroidism, unspecified: Secondary | ICD-10-CM

## 2018-11-02 NOTE — Telephone Encounter (Signed)
-----   Message from Cloyd Stagers, RT sent at 11/02/2018  1:23 PM EDT ----- Regarding: Lab Orders for Wednesday 4.15.2020 Please place lab orders for Wednesday 4.15.2020, [email or web address] Thursday 4.16.2020 Thanks, Dyke Maes RT(R)

## 2018-11-03 ENCOUNTER — Other Ambulatory Visit: Payer: Self-pay

## 2018-11-03 ENCOUNTER — Other Ambulatory Visit (INDEPENDENT_AMBULATORY_CARE_PROVIDER_SITE_OTHER): Payer: Medicare Other

## 2018-11-03 DIAGNOSIS — J84111 Idiopathic interstitial pneumonia, not otherwise specified: Secondary | ICD-10-CM | POA: Diagnosis not present

## 2018-11-03 DIAGNOSIS — E1165 Type 2 diabetes mellitus with hyperglycemia: Secondary | ICD-10-CM | POA: Diagnosis not present

## 2018-11-03 DIAGNOSIS — G4733 Obstructive sleep apnea (adult) (pediatric): Secondary | ICD-10-CM | POA: Diagnosis not present

## 2018-11-03 DIAGNOSIS — J8489 Other specified interstitial pulmonary diseases: Secondary | ICD-10-CM | POA: Diagnosis not present

## 2018-11-03 DIAGNOSIS — E039 Hypothyroidism, unspecified: Secondary | ICD-10-CM | POA: Diagnosis not present

## 2018-11-03 LAB — T4, FREE: Free T4: 1.26 ng/dL (ref 0.60–1.60)

## 2018-11-03 LAB — TSH: TSH: 2.52 u[IU]/mL (ref 0.35–4.50)

## 2018-11-03 LAB — COMPREHENSIVE METABOLIC PANEL
ALT: 10 U/L (ref 0–35)
AST: 10 U/L (ref 0–37)
Albumin: 4.3 g/dL (ref 3.5–5.2)
Alkaline Phosphatase: 82 U/L (ref 39–117)
BUN: 11 mg/dL (ref 6–23)
CO2: 31 mEq/L (ref 19–32)
Calcium: 10.6 mg/dL — ABNORMAL HIGH (ref 8.4–10.5)
Chloride: 94 mEq/L — ABNORMAL LOW (ref 96–112)
Creatinine, Ser: 0.74 mg/dL (ref 0.40–1.20)
GFR: 78.06 mL/min (ref >60.00)
Glucose, Bld: 135 mg/dL — ABNORMAL HIGH (ref 70–99)
Potassium: 4.2 mEq/L (ref 3.5–5.1)
Sodium: 138 mEq/L (ref 135–145)
Total Bilirubin: 0.5 mg/dL (ref 0.2–1.2)
Total Protein: 8.3 g/dL (ref 6.0–8.3)

## 2018-11-03 LAB — T3, FREE: T3, Free: 3.1 pg/mL (ref 2.3–4.2)

## 2018-11-03 LAB — POCT GLYCOSYLATED HEMOGLOBIN (HGB A1C): Hemoglobin A1C: 6.8 % — AB (ref 4.0–5.6)

## 2018-11-04 ENCOUNTER — Encounter: Payer: Self-pay | Admitting: Family Medicine

## 2018-11-04 ENCOUNTER — Ambulatory Visit (INDEPENDENT_AMBULATORY_CARE_PROVIDER_SITE_OTHER): Payer: Medicare Other | Admitting: Family Medicine

## 2018-11-04 VITALS — BP 142/74 | HR 101 | Ht 66.5 in | Wt 230.2 lb

## 2018-11-04 DIAGNOSIS — E1169 Type 2 diabetes mellitus with other specified complication: Secondary | ICD-10-CM

## 2018-11-04 DIAGNOSIS — E785 Hyperlipidemia, unspecified: Secondary | ICD-10-CM | POA: Diagnosis not present

## 2018-11-04 DIAGNOSIS — I10 Essential (primary) hypertension: Secondary | ICD-10-CM

## 2018-11-04 DIAGNOSIS — E1165 Type 2 diabetes mellitus with hyperglycemia: Secondary | ICD-10-CM | POA: Diagnosis not present

## 2018-11-04 DIAGNOSIS — R197 Diarrhea, unspecified: Secondary | ICD-10-CM | POA: Diagnosis not present

## 2018-11-04 DIAGNOSIS — E1159 Type 2 diabetes mellitus with other circulatory complications: Secondary | ICD-10-CM

## 2018-11-04 NOTE — Assessment & Plan Note (Signed)
Well controlled. Continue current medication. Encouraged exercise, weight loss, healthy eating habits.  

## 2018-11-04 NOTE — Progress Notes (Signed)
VIRTUAL VISIT Due to national recommendations of social distancing due to [place] 19, a virtual visit is felt to be most appropriate for this patient at this time.   Interactive audio and video telecommunications were attempted between this provider and patient, however failed, due to patient having technical difficulties OR patient did not have access to video capability.  We continued and completed visit with audio only.   I connected with the patient on 11/04/18 at 10:30 AM EDT by phone and verified that I am speaking with the correct person using two identifiers.   I discussed the limitations, risks, security and privacy concerns of performing an evaluation and management service by  virtual telehealth platform and the availability of in person appointments. I also discussed with the patient that there may be a patient responsible charge related to this service. The patient expressed understanding and agreed to proceed.  Patient location: Home Provider Location: [facility] Participants: [name] and [name]   Chief Complaint  Patient presents with  . Diabetes    History of Present Illness: 68 year old female presents for follow up DM. She has new acute issue of diarrhea  Diabetes: On max max actos, and  Max metformin. At last OV increased metformin to  3 tabs daily and added invokana 6 week ago as had to stop onglyza for cost.  A1C signifcantly better now.  Lab Results  Component Value Date   HGBA1C 6.8 (A) 11/03/2018  Using medications without difficulties: Hypoglycemic episodes:none Hyperglycemic episodes: none Feet problems: no ulcers Blood Sugars averaging: FBS 109-180 eye exam within last year: uptodate   She has been haivng diarrhea in last week.upper to low center abd pain ( cramping, occurs prior to BM,  . Starting to improve today.  Watery, frequent BM.Marland Kitchen3 times yesterday.. none yet today No fever, no blood in stool.  No recent travel, no well  water.  No recent antibiotics.  Not where she has diverticulitis pain Able to keep up with water intake, decrease po intake.  no nausea.   No sick contacts   Hypertension:   Previously at goal on losartan HCTZ BP Readings from Last 3 Encounters:  11/04/18 (!) 142/74  08/16/18 (!) 134/56  07/27/18 122/70  Using medication without problems or lightheadedness:  none Chest pain with exertion: none Edema: none Short of breath: Average home BPs: Other issues:  Elevated Cholesterol:LDL  Almost at goal on  pravachol at last check 3 months ago.Marland Kitchen re-eval in 3 months Using medications without problems: Muscle aches:  Diet compliance: Exercise: Other complaints:   Hypothyroidism:  At goal on current regimen.  Social History /Family History/Past Medical History reviewed in detail and updated in EMR if needed.  COVID 19 screen No recent travel or known exposure to COVID19 The patient denies respiratory symptoms of COVID 19 at this time.  The importance of social distancing was discussed today.   Review of Systems  Constitutional: Negative for chills and fever.  HENT: Negative for congestion and ear pain.   Eyes: Negative for pain and redness.  Respiratory: Negative for cough and shortness of breath.   Cardiovascular: Negative for chest pain, palpitations and leg swelling.  Gastrointestinal: Positive for abdominal pain and diarrhea. Negative for blood in stool, constipation, nausea and vomiting.  Genitourinary: Negative for dysuria.  Musculoskeletal: Negative for falls and myalgias.  Skin: Negative for rash.  Neurological: Negative for dizziness.  Psychiatric/Behavioral: Negative for depression. The patient is not nervous/anxious.  Past Medical History:  Diagnosis Date  . Allergic rhinitis    uses Flonase daily  . Anesthesia complication    Per pt/ past endoscopy in 2015, the anesthetic spray in back throat caused her to have breathing problems  . Anxiety   . Bronchitis    . Chicken pox   . Constipation    takes Fiber daily  . Depression   . Diverticulosis   . Dysphagia   . GERD (gastroesophageal reflux disease)    takes Protonix daily  . H/O hiatal hernia   . Hemorrhoids   . History of bronchitis    2013  . History of colon polyps   . History of kidney stones   . Hyperlipidemia    lost 43 pounds and no meds required at present  . Hypertension    takes Verapamil and HCTZ daily  . Hypokalemia   . Hypothyroidism (acquired)    takes Synthroid daily  . Insomnia    doesn't take any meds for this  . Joint swelling    left thumb  . Migraines    last one about a month ago  . Non-insulin dependent type 2 diabetes mellitus ([place])    takes Glipizide daily  . OA (osteoarthritis)    left knee  . Oxygen deficiency   . Oxygen dependent    Pt is on continuous O2 at 3 liters!  . Pneumonia    last time in 2006  . PONV (postoperative nausea and vomiting)   . Shortness of breath    with exertion;takes Singulair daily as well as Flonase  . Urinary frequency     reports that she has never smoked. She has never used smokeless tobacco. She reports that she does not drink alcohol or use drugs.   Current Outpatient Medications:  .  acetaminophen (TYLENOL) 500 MG tablet, Take 1,000 mg by mouth every 6 (six) hours as needed for moderate pain., Disp: , Rfl:  .  canagliflozin (INVOKANA) 100 MG TABS tablet, Take 1 tablet (100 mg total) by mouth daily before breakfast., Disp: 90 tablet, Rfl: 3 .  diphenhydrAMINE (BENADRYL) 25 mg capsule, Take 25 mg by mouth as needed., Disp: , Rfl:  .  FIBER PO, Take 1 tablet by mouth daily., Disp: , Rfl:  .  fluticasone (FLONASE) 50 MCG/ACT nasal spray, Place 1 spray into both nostrils daily., Disp: , Rfl:  .  glucose blood (ONE TOUCH ULTRA TEST) test strip, CHECK BLOOD SUGAR DAILY., Disp: 100 each, Rfl: 3 .  hydrochlorothiazide (HYDRODIURIL) 12.5 MG tablet, TAKE 1 TABLET DAILY ALONG WITH ONE OF THE LOSARTAN 50 MG TABLETS, Disp: 90  tablet, Rfl: 0 .  levothyroxine (SYNTHROID, LEVOTHROID) 125 MCG tablet, TAKE 1 TABLET BY MOUTH  DAILY BEFORE BREAKFAST, Disp: 90 tablet, Rfl: 0 .  losartan (COZAAR) 50 MG tablet, TAKE 1 TABLET DAILY ALONG WITH 1 OF THE HCTZ 12.5MG  TABLETS, Disp: 90 tablet, Rfl: 0 .  losartan-hydrochlorothiazide (HYZAAR) 50-12.5 MG tablet, Take 1 tablet by mouth daily., Disp: 90 tablet, Rfl: 1 .  metFORMIN (GLUCOPHAGE-XR) 500 MG 24 hr tablet, Take 3 tablets (1,500 mg total) by mouth daily with breakfast., Disp: 270 tablet, Rfl: 3 .  montelukast (SINGULAIR) 10 MG tablet, Take 1 tablet (10 mg total) by mouth at bedtime., Disp: 90 tablet, Rfl: 3 .  mycophenolate (CELLCEPT) 500 MG tablet, Take 2 tablets (1,000 mg total) by mouth 2 (two) times daily., Disp: 120 tablet, Rfl: 3 .  NON FORMULARY, Place 4 L into the nose daily.  10 Liters with exertion, Disp: , Rfl:  .  nystatin cream (MYCOSTATIN), Apply 1 application topically 3 (three) times daily., Disp: 30 g, Rfl: 0 .  pantoprazole (PROTONIX) 40 MG tablet, Take 1 tablet (40 mg total) by mouth 2 (two) times daily., Disp: 180 tablet, Rfl: 1 .  pioglitazone (ACTOS) 45 MG tablet, Take 1 tablet (45 mg total) by mouth daily., Disp: 90 tablet, Rfl: 3 .  Pirfenidone (ESBRIET) 801 MG TABS, Take 801 mg by mouth 3 (three) times daily., Disp: 180 tablet, Rfl: 6 .  pravastatin (PRAVACHOL) 40 MG tablet, Take 1 tablet (40 mg total) by mouth daily., Disp: 90 tablet, Rfl: 2 .  saxagliptin HCl (ONGLYZA) 5 MG TABS tablet, Take 1 tablet (5 mg total) by mouth daily., Disp: 90 tablet, Rfl: 1 .  verapamil (CALAN-SR) 180 MG CR tablet, Take 1 tablet (180 mg total) by mouth daily., Disp: 90 tablet, Rfl: 1   Observations/Objective: Blood pressure (!) 142/74, pulse (!) 101, height 5' 6.5" (1.689 m), weight 230 lb 3 oz (104.4 kg).  Physical Exam  Physical Exam Constitutional:      General: She is not in acute distress. Pulmonary:     Effort: Pulmonary effort is normal. No respiratory distress  as able to assess through phone. Neurological:     Mental Status: She is alert and oriented to person, place, and time.  Psychiatric:        Mood and Affect: Mood normal.        Behavior: Behavior normal.   Assessment and Plan  Uncontrolled type 2 diabetes mellitus with hyperglycemia (HCC) Much improved control on higher dose of metformin as well as actos and new addition invokana.  Acute diarrhea Most consistent with viral GE. No red flags. Seems to be improving but if worsens or not resolving will need in person exam.   possible stress/anxiety induced IBS.  No consistent with diverticulitis.  Hyperlipidemia associated with type 2 diabetes mellitus ([place]) Stable at last check.  Hypertension associated with diabetes ([place]) Well controlled. Continue current medication. Encouraged exercise, weight loss, healthy eating habits.      I discussed the assessment and treatment plan with the patient. The patient was provided an opportunity to ask questions and all were answered. The patient agreed with the plan and demonstrated an understanding of the instructions.   The patient was advised to call back or seek an in-person evaluation if the symptoms worsen or if the condition fails to improve as anticipated.     [name], MD

## 2018-11-04 NOTE — Patient Instructions (Addendum)
Keep up with fluids.   Continue your other meds for now.  Start back bland foods.. chicken soup, advance diet as tolerated. Stop any ibuprofen.  Call for in person appt if abdominal pain not improving, sooner, go to ER  If severe abdominal apin or fever or  not keeping down fluids.

## 2018-11-04 NOTE — Assessment & Plan Note (Signed)
Most consistent with viral GE. No red flags. Seems to be improving but if worsens or not resolving will need in person exam.   possible stress/anxiety induced IBS.  No consistent with diverticulitis.

## 2018-11-04 NOTE — Assessment & Plan Note (Signed)
Stable at last check.

## 2018-11-04 NOTE — Assessment & Plan Note (Signed)
Much improved control on higher dose of metformin as well as actos and new addition invokana.

## 2018-11-05 ENCOUNTER — Other Ambulatory Visit: Payer: Medicare Other

## 2018-11-09 ENCOUNTER — Ambulatory Visit: Payer: Medicare Other | Admitting: Family Medicine

## 2018-11-18 ENCOUNTER — Other Ambulatory Visit: Payer: Self-pay

## 2018-11-18 ENCOUNTER — Ambulatory Visit (INDEPENDENT_AMBULATORY_CARE_PROVIDER_SITE_OTHER): Payer: Medicare Other | Admitting: Family Medicine

## 2018-11-18 ENCOUNTER — Encounter: Payer: Self-pay | Admitting: Family Medicine

## 2018-11-18 VITALS — BP 118/62 | HR 108 | Temp 98.3°F | Ht 66.5 in | Wt 237.8 lb

## 2018-11-18 DIAGNOSIS — E1165 Type 2 diabetes mellitus with hyperglycemia: Secondary | ICD-10-CM

## 2018-11-18 DIAGNOSIS — R1032 Left lower quadrant pain: Secondary | ICD-10-CM | POA: Diagnosis not present

## 2018-11-18 LAB — CBC WITH DIFFERENTIAL/PLATELET
Basophils Absolute: 0 10*3/uL (ref 0.0–0.1)
Basophils Relative: 0.3 % (ref 0.0–3.0)
Eosinophils Absolute: 0.1 10*3/uL (ref 0.0–0.7)
Eosinophils Relative: 0.6 % (ref 0.0–5.0)
HCT: 38.5 % (ref 36.0–46.0)
Hemoglobin: 12.3 g/dL (ref 12.0–15.0)
Lymphocytes Relative: 10.3 % — ABNORMAL LOW (ref 12.0–46.0)
Lymphs Abs: 1.2 10*3/uL (ref 0.7–4.0)
MCHC: 32 g/dL (ref 30.0–36.0)
MCV: 91.1 fl (ref 78.0–100.0)
Monocytes Absolute: 0.8 10*3/uL (ref 0.1–1.0)
Monocytes Relative: 7.1 % (ref 3.0–12.0)
Neutro Abs: 9.7 10*3/uL — ABNORMAL HIGH (ref 1.4–7.7)
Neutrophils Relative %: 81.7 % — ABNORMAL HIGH (ref 43.0–77.0)
Platelets: 266 10*3/uL (ref 150.0–400.0)
RBC: 4.23 Mil/uL (ref 3.87–5.11)
RDW: 14.8 % (ref 11.5–15.5)
WBC: 11.9 10*3/uL — ABNORMAL HIGH (ref 4.0–10.5)

## 2018-11-18 LAB — POC URINALSYSI DIPSTICK (AUTOMATED)
Bilirubin, UA: NEGATIVE
Blood, UA: NEGATIVE
Glucose, UA: POSITIVE — AB
Ketones, UA: NEGATIVE
Leukocytes, UA: NEGATIVE
Nitrite, UA: NEGATIVE
Protein, UA: POSITIVE — AB
Spec Grav, UA: 1.01 (ref 1.010–1.025)
Urobilinogen, UA: 0.2 E.U./dL
pH, UA: 6 (ref 5.0–8.0)

## 2018-11-18 MED ORDER — AMOXICILLIN-POT CLAVULANATE 875-125 MG PO TABS: 1.0000 | ORAL_TABLET | Freq: Two times a day (BID) | ORAL | 0 refills | Status: DC

## 2018-11-18 NOTE — Assessment & Plan Note (Signed)
Neg UA Concerning for diverticulitis. Pt very hesitant about having CT scan done given her pulmonary risk in COVID 19 climate. Check cbc... will treat for diverticulitis empirically.  Close follow up in 24 hours.  If not improving consider CT ab/pelvis

## 2018-11-18 NOTE — Patient Instructions (Signed)
Please stop at the lab to have labs drawn.  Start Augmentin.  If fever on antibiotics, not keeping down fluids or severe pain.Marland Kitchen go to ER.

## 2018-11-18 NOTE — Progress Notes (Signed)
   Subjective:    Patient ID: Kaitlin Hamilton, female    DOB: 05-10-1951, 68 y.o.   MRN: [id number]  HPI   68 year old female pt on continuous oxygen for UIP with history of uncontrolled DM, GERD and esophageal dysmotility. Presents with LLQ pain.  She was seen via virtual visit on 11/04/2018 for acute diarrhea. Flet most likely due to viral GE. Watery, frequent BM.Marland Kitchen3 times yesterday.. none yet today No constipation. No fever, no blood in stool.  No recent travel, no well water.  No recent antibiotics.  Not where she has diverticulitis pain Able to keep up with water intake, decrease po intake.  no nausea.  No sick contacts  She reports that diarrhea and LLQ pain cleared up 2-3 days following appt.  Yesterday at 1 AM.. started back with LLQ pain, severe.. radiates up to central abdomen. No diarrhea, no blood in stool.  No nausea, no vomiting.  No fever.  no rash.  No dysuria, does have some urinary pressure  (UA clear except for glcuose.. no blood)   Hx of diverticulitis.Marland Kitchen treated emperically last in 07/2018 with augmentin. Also 06/2018: abd pelvic CT: treated with augmentin. IMPRESSION: 1. Findings compatible with acute diverticulitis of the mid descending colon, however, follow-up is recommended to exclude neoplasm which can occasionally have a similar appearance and presentation. No abscess or complicating features. 2. No other acute or inflammatory process identified in the abdomen or pelvis. 3. Nephrolithiasis. Chronic interstitial lung disease.  She has both ovaries and uterus.  Social History /Family History/Past Medical History reviewed in detail and updated in EMR if needed. Blood pressure 118/62, pulse (!) 108, temperature 98.3 F (36.8 C), temperature source Oral, height 5' 6.5" (1.689 m), weight 237 lb 12 oz (107.8 kg), SpO2 96 %.  Review of Systems  Constitutional: Negative for fatigue and fever.  HENT: Negative for congestion.   Eyes: Negative for pain.   Respiratory: Negative for cough and shortness of breath.   Cardiovascular: Negative for chest pain, palpitations and leg swelling.  Gastrointestinal: Negative for abdominal pain.  Genitourinary: Negative for dysuria and vaginal bleeding.  Musculoskeletal: Negative for back pain.  Neurological: Negative for syncope, light-headedness and headaches.  Psychiatric/Behavioral: Negative for dysphoric mood.       Objective:   Physical Exam Constitutional:      Comments: On oxygen  Pulmonary:     Effort: Pulmonary effort is normal.     Breath sounds: Examination of the right-lower field reveals rales. Examination of the left-lower field reveals rales. Rales present. No decreased breath sounds, wheezing or rhonchi.  Abdominal:     General: Abdomen is protuberant. Bowel sounds are normal.     Palpations: Abdomen is soft. There is no hepatomegaly, splenomegaly or mass.     Tenderness: There is abdominal tenderness in the periumbilical area and left lower quadrant. There is guarding and rebound. There is no right CVA tenderness or left CVA tenderness. Negative signs include Murphy's sign, McBurney's sign and obturator sign.     Hernia: No hernia is present.            Assessment & Plan:  LLQ abdominal pain  Neg UA Concerning for diverticulitis. Pt very hesitant about having CT scan done given her pulmonary risk in COVID 19 climate. Check cbc... will treat for diverticulitis empirically.  Close follow up in 24 hours.  If not improving consider CT ab/pelvis

## 2018-11-19 ENCOUNTER — Encounter: Payer: Self-pay | Admitting: Family Medicine

## 2018-11-19 ENCOUNTER — Ambulatory Visit (INDEPENDENT_AMBULATORY_CARE_PROVIDER_SITE_OTHER): Payer: Medicare Other | Admitting: Family Medicine

## 2018-11-19 VITALS — BP 128/60 | HR 79 | Ht 66.5 in | Wt 236.1 lb

## 2018-11-19 DIAGNOSIS — K5792 Diverticulitis of intestine, part unspecified, without perforation or abscess without bleeding: Secondary | ICD-10-CM

## 2018-11-19 DIAGNOSIS — E1165 Type 2 diabetes mellitus with hyperglycemia: Secondary | ICD-10-CM

## 2018-11-19 NOTE — Assessment & Plan Note (Signed)
Significant improvement in diverticulitis with just 24 hours of Augmentin. NO SE. Advance diet as tolerated and keep up with fluids.

## 2018-11-19 NOTE — Progress Notes (Signed)
VIRTUAL VISIT Due to national recommendations of social distancing due to [place] 19, a virtual visit is felt to be most appropriate for this patient at this time.   I connected with the patient on 11/19/18 at  2:40 PM EDT by virtual telehealth platform and verified that I am speaking with the correct person using two identifiers.   I discussed the limitations, risks, security and privacy concerns of performing an evaluation and management service by  virtual telehealth platform and the availability of in person appointments. I also discussed with the patient that there may be a patient responsible charge related to this service. The patient expressed understanding and agreed to proceed.  Patient location: Home Provider Location: [facility] Participants: [name] and [name]   Chief Complaint  Patient presents with  . Follow-up    Abdominal Pain    History of Present Illness:  68 year old female presents for follow up abdominal pain.  Yesterday dx with presumed diverticulitis.  CBC showed WBC 11.9  Started in Augmentin.  Now on Augmentin  >24 hours.   She is feeling much better overall.  She reports her abdominal pain 90 % better. No nausea, no diarrhea, no constipation.  No fever. No blood in stool  She is keeping down fluids, bland diet.   COVID 19 screen No recent travel or known exposure to COVID19 The patient denies respiratory symptoms of COVID 19 at this time.  The importance of social distancing was discussed today.   Review of Systems  Constitutional: Negative for chills and fever.  HENT: Negative for congestion and ear pain.   Eyes: Negative for pain and redness.  Respiratory: Negative for cough and shortness of breath.   Cardiovascular: Negative for chest pain, palpitations and leg swelling.  Gastrointestinal: Negative for abdominal pain, blood in stool, constipation, diarrhea, nausea and vomiting.  Genitourinary: Negative for dysuria.   Musculoskeletal: Negative for falls and myalgias.  Skin: Negative for rash.  Neurological: Negative for dizziness.  Psychiatric/Behavioral: Negative for depression. The patient is not nervous/anxious.       Past Medical History:  Diagnosis Date  . Allergic rhinitis    uses Flonase daily  . Anesthesia complication    Per pt/ past endoscopy in 2015, the anesthetic spray in back throat caused her to have breathing problems  . Anxiety   . Bronchitis   . Chicken pox   . Constipation    takes Fiber daily  . Depression   . Diverticulosis   . Dysphagia   . GERD (gastroesophageal reflux disease)    takes Protonix daily  . H/O hiatal hernia   . Hemorrhoids   . History of bronchitis    2013  . History of colon polyps   . History of kidney stones   . Hyperlipidemia    lost 43 pounds and no meds required at present  . Hypertension    takes Verapamil and HCTZ daily  . Hypokalemia   . Hypothyroidism (acquired)    takes Synthroid daily  . Insomnia    doesn't take any meds for this  . Joint swelling    left thumb  . Migraines    last one about a month ago  . Non-insulin dependent type 2 diabetes mellitus ([place])    takes Glipizide daily  . OA (osteoarthritis)    left knee  . Oxygen deficiency   . Oxygen dependent    Pt is on continuous O2 at 3 liters!  . Pneumonia  last time in 2006  . PONV (postoperative nausea and vomiting)   . Shortness of breath    with exertion;takes Singulair daily as well as Flonase  . Urinary frequency     reports that she has never smoked. She has never used smokeless tobacco. She reports that she does not drink alcohol or use drugs.   Current Outpatient Medications:  .  acetaminophen (TYLENOL) 500 MG tablet, Take 1,000 mg by mouth every 6 (six) hours as needed for moderate pain., Disp: , Rfl:  .  amoxicillin-clavulanate (AUGMENTIN) 875-125 MG tablet, Take 1 tablet by mouth 2 (two) times daily., Disp: 20 tablet, Rfl: 0 .  canagliflozin (INVOKANA)  100 MG TABS tablet, Take 1 tablet (100 mg total) by mouth daily before breakfast., Disp: 90 tablet, Rfl: 3 .  diphenhydrAMINE (BENADRYL) 25 mg capsule, Take 25 mg by mouth as needed., Disp: , Rfl:  .  FIBER PO, Take 1 tablet by mouth daily., Disp: , Rfl:  .  fluticasone (FLONASE) 50 MCG/ACT nasal spray, Place 1 spray into both nostrils daily., Disp: , Rfl:  .  glucose blood (ONE TOUCH ULTRA TEST) test strip, CHECK BLOOD SUGAR DAILY., Disp: 100 each, Rfl: 3 .  hydrochlorothiazide (HYDRODIURIL) 12.5 MG tablet, TAKE 1 TABLET DAILY ALONG WITH ONE OF THE LOSARTAN 50 MG TABLETS, Disp: 90 tablet, Rfl: 0 .  levothyroxine (SYNTHROID, LEVOTHROID) 125 MCG tablet, TAKE 1 TABLET BY MOUTH  DAILY BEFORE BREAKFAST, Disp: 90 tablet, Rfl: 0 .  losartan (COZAAR) 50 MG tablet, TAKE 1 TABLET DAILY ALONG WITH 1 OF THE HCTZ 12.5MG  TABLETS, Disp: 90 tablet, Rfl: 0 .  losartan-hydrochlorothiazide (HYZAAR) 50-12.5 MG tablet, Take 1 tablet by mouth daily., Disp: 90 tablet, Rfl: 1 .  metFORMIN (GLUCOPHAGE-XR) 500 MG 24 hr tablet, Take 3 tablets (1,500 mg total) by mouth daily with breakfast., Disp: 270 tablet, Rfl: 3 .  montelukast (SINGULAIR) 10 MG tablet, Take 1 tablet (10 mg total) by mouth at bedtime., Disp: 90 tablet, Rfl: 3 .  mycophenolate (CELLCEPT) 500 MG tablet, Take 2 tablets (1,000 mg total) by mouth 2 (two) times daily., Disp: 120 tablet, Rfl: 3 .  NON FORMULARY, Place 4 L into the nose daily. 10 Liters with exertion, Disp: , Rfl:  .  nystatin cream (MYCOSTATIN), Apply 1 application topically 3 (three) times daily., Disp: 30 g, Rfl: 0 .  pantoprazole (PROTONIX) 40 MG tablet, Take 1 tablet (40 mg total) by mouth 2 (two) times daily., Disp: 180 tablet, Rfl: 1 .  pioglitazone (ACTOS) 45 MG tablet, Take 1 tablet (45 mg total) by mouth daily., Disp: 90 tablet, Rfl: 3 .  Pirfenidone (ESBRIET) 801 MG TABS, Take 801 mg by mouth 3 (three) times daily., Disp: 180 tablet, Rfl: 6 .  pravastatin (PRAVACHOL) 40 MG tablet, Take  1 tablet (40 mg total) by mouth daily., Disp: 90 tablet, Rfl: 2 .  verapamil (CALAN-SR) 180 MG CR tablet, Take 1 tablet (180 mg total) by mouth daily., Disp: 90 tablet, Rfl: 1   Observations/Objective: Blood pressure 128/60, pulse 79, height 5' 6.5" (1.689 m), weight 236 lb 1 oz (107.1 kg).  Physical Exam  Physical Exam Constitutional:      General: The patient is not in acute distress. Pulmonary:     Effort: Pulmonary effort is normal. No respiratory distress.  Neurological:     Mental Status: The patient is alert and oriented to person, place, and time.  Psychiatric:        Mood and Affect: Mood normal.  Behavior: Behavior normal.   Assessment and Plan Acute diverticulitis Significant improvement in diverticulitis with just 24 hours of Augmentin. NO SE. Advance diet as tolerated and keep up with fluids.   Uncontrolled type 2 diabetes mellitus with hyperglycemia (HCC) High risk given presence of DM.     I discussed the assessment and treatment plan with the patient. The patient was provided an opportunity to ask questions and all were answered. The patient agreed with the plan and demonstrated an understanding of the instructions.   The patient was advised to call back or seek an in-person evaluation if the symptoms worsen or if the condition fails to improve as anticipated.     [name], MD

## 2018-11-19 NOTE — Assessment & Plan Note (Signed)
High risk given presence of DM.

## 2018-11-22 ENCOUNTER — Other Ambulatory Visit: Payer: Self-pay | Admitting: Family Medicine

## 2018-11-27 ENCOUNTER — Other Ambulatory Visit: Payer: Self-pay | Admitting: Family Medicine

## 2018-12-03 DIAGNOSIS — G4733 Obstructive sleep apnea (adult) (pediatric): Secondary | ICD-10-CM | POA: Diagnosis not present

## 2018-12-03 DIAGNOSIS — J84111 Idiopathic interstitial pneumonia, not otherwise specified: Secondary | ICD-10-CM | POA: Diagnosis not present

## 2018-12-03 DIAGNOSIS — J8489 Other specified interstitial pulmonary diseases: Secondary | ICD-10-CM | POA: Diagnosis not present

## 2018-12-20 NOTE — Telephone Encounter (Signed)
Yes. We can hold off/reschedule CT out a couple months with follow up with Dr. [name] after CT scan. Glad to hear she is doing well. Thanks.

## 2018-12-20 NOTE — Telephone Encounter (Signed)
I have sent a MyChart message to pt regarding [place]'s recommendations. Will keep encounter open to await a response.

## 2018-12-20 NOTE — Telephone Encounter (Signed)
Pt's daughter, Donella Stade, sent MyChart messages inquiring if pt truly needs CT scan scheduled for 12/28/2018 and a follow-up appointment in-office. Pt daughter states pt has not had CT in 5 years and that the intention of this CT was to follow her lung disease progression. Pt is a BQ pt. According to last office notes from 07/27/2018, BQ ordered for pt to have a CT scan done in April, however d/t COVID, it was rescheduled to June 9th at 11:00 AM.   Pt daughter states pt "feels stable with her O2 requirements, still coughs with eating, but her symptoms have not worsened or improved." States pt feels stable overall and does not think an OV would be necessary at this point. States pt is really nervous and hesitant to get the CT scan done and would be comfortable with holding off as long as BQ is.   I message pt daughter back letting her know we would get this routed to a practitioner in-office, as BQ is currently working at the [employer].   Since BQ is not in the office, routing to TN who has seen this patient in-office before. TN, please advise on your recommendations for this pt. Thank you.

## 2018-12-24 ENCOUNTER — Other Ambulatory Visit: Payer: Self-pay | Admitting: Pulmonary Disease

## 2018-12-24 DIAGNOSIS — J3089 Other allergic rhinitis: Secondary | ICD-10-CM

## 2018-12-28 ENCOUNTER — Ambulatory Visit: Payer: Medicare Other

## 2019-01-03 DIAGNOSIS — G4733 Obstructive sleep apnea (adult) (pediatric): Secondary | ICD-10-CM | POA: Diagnosis not present

## 2019-01-03 DIAGNOSIS — J8489 Other specified interstitial pulmonary diseases: Secondary | ICD-10-CM | POA: Diagnosis not present

## 2019-01-03 DIAGNOSIS — J84111 Idiopathic interstitial pneumonia, not otherwise specified: Secondary | ICD-10-CM | POA: Diagnosis not present

## 2019-01-11 ENCOUNTER — Other Ambulatory Visit: Payer: Self-pay | Admitting: Pulmonary Disease

## 2019-01-27 ENCOUNTER — Other Ambulatory Visit: Payer: Self-pay | Admitting: Family Medicine

## 2019-01-27 NOTE — Telephone Encounter (Signed)
Last office visit 11/19/2018 for DM.  Last refilled 06/03/2018 for 30 g with no refills.  Next Appt: 02/04/2019 for 3 month follow up.

## 2019-01-31 ENCOUNTER — Other Ambulatory Visit: Payer: Self-pay | Admitting: Family Medicine

## 2019-02-01 ENCOUNTER — Other Ambulatory Visit (INDEPENDENT_AMBULATORY_CARE_PROVIDER_SITE_OTHER): Payer: Medicare Other

## 2019-02-01 ENCOUNTER — Telehealth: Payer: Self-pay | Admitting: Family Medicine

## 2019-02-01 DIAGNOSIS — E1165 Type 2 diabetes mellitus with hyperglycemia: Secondary | ICD-10-CM

## 2019-02-01 LAB — COMPREHENSIVE METABOLIC PANEL
ALT: 10 U/L (ref 0–35)
AST: 12 U/L (ref 0–37)
Albumin: 4.7 g/dL (ref 3.5–5.2)
Alkaline Phosphatase: 75 U/L (ref 39–117)
BUN: 10 mg/dL (ref 6–23)
CO2: 31 mEq/L (ref 19–32)
Calcium: 10.1 mg/dL (ref 8.4–10.5)
Chloride: 97 mEq/L (ref 96–112)
Creatinine, Ser: 0.69 mg/dL (ref 0.40–1.20)
GFR: 84.56 mL/min (ref >60.00)
Glucose, Bld: 149 mg/dL — ABNORMAL HIGH (ref 70–99)
Potassium: 4.2 mEq/L (ref 3.5–5.1)
Sodium: 137 mEq/L (ref 135–145)
Total Bilirubin: 0.4 mg/dL (ref 0.2–1.2)
Total Protein: 7.6 g/dL (ref 6.0–8.3)

## 2019-02-01 LAB — LIPID PANEL
Cholesterol: 193 mg/dL (ref 0–200)
HDL: 71.2 mg/dL (ref >39.00)
LDL Cholesterol: 88 mg/dL (ref 0–99)
NonHDL: 121.6
Total CHOL/HDL Ratio: 3
Triglycerides: 167 mg/dL — ABNORMAL HIGH (ref 0.0–149.0)
VLDL: 33.4 mg/dL (ref 0.0–40.0)

## 2019-02-01 LAB — HEMOGLOBIN A1C: Hgb A1c MFr Bld: 6.6 % — ABNORMAL HIGH (ref 4.6–6.5)

## 2019-02-01 NOTE — Telephone Encounter (Signed)
-----   Message from Ellamae Sia sent at 01/26/2019  4:23 PM EDT ----- Regarding: lab orders for Tuesday, 7.14.20 Lab orders

## 2019-02-02 DIAGNOSIS — J8489 Other specified interstitial pulmonary diseases: Secondary | ICD-10-CM | POA: Diagnosis not present

## 2019-02-02 DIAGNOSIS — G4733 Obstructive sleep apnea (adult) (pediatric): Secondary | ICD-10-CM | POA: Diagnosis not present

## 2019-02-02 DIAGNOSIS — J84111 Idiopathic interstitial pneumonia, not otherwise specified: Secondary | ICD-10-CM | POA: Diagnosis not present

## 2019-02-03 ENCOUNTER — Telehealth: Payer: Self-pay | Admitting: Pulmonary Disease

## 2019-02-03 NOTE — Telephone Encounter (Signed)
Routing to both [place] not knowing if the paperwork will be placed in BQ's box or if it will be placed in Anita's box upfront.

## 2019-02-04 ENCOUNTER — Encounter: Payer: Self-pay | Admitting: Family Medicine

## 2019-02-04 ENCOUNTER — Other Ambulatory Visit: Payer: Self-pay

## 2019-02-04 ENCOUNTER — Ambulatory Visit (INDEPENDENT_AMBULATORY_CARE_PROVIDER_SITE_OTHER): Payer: Medicare Other | Admitting: Family Medicine

## 2019-02-04 DIAGNOSIS — E785 Hyperlipidemia, unspecified: Secondary | ICD-10-CM

## 2019-02-04 DIAGNOSIS — J3089 Other allergic rhinitis: Secondary | ICD-10-CM

## 2019-02-04 DIAGNOSIS — E1165 Type 2 diabetes mellitus with hyperglycemia: Secondary | ICD-10-CM | POA: Diagnosis not present

## 2019-02-04 DIAGNOSIS — I152 Hypertension secondary to endocrine disorders: Secondary | ICD-10-CM

## 2019-02-04 DIAGNOSIS — J309 Allergic rhinitis, unspecified: Secondary | ICD-10-CM | POA: Diagnosis not present

## 2019-02-04 DIAGNOSIS — E1159 Type 2 diabetes mellitus with other circulatory complications: Secondary | ICD-10-CM

## 2019-02-04 DIAGNOSIS — J84112 Idiopathic pulmonary fibrosis: Secondary | ICD-10-CM

## 2019-02-04 DIAGNOSIS — I10 Essential (primary) hypertension: Secondary | ICD-10-CM

## 2019-02-04 DIAGNOSIS — E1169 Type 2 diabetes mellitus with other specified complication: Secondary | ICD-10-CM | POA: Diagnosis not present

## 2019-02-04 LAB — HM DIABETES FOOT EXAM

## 2019-02-04 MED ORDER — HYDROCHLOROTHIAZIDE 12.5 MG PO TABS: ORAL_TABLET | ORAL | 3 refills | Status: DC

## 2019-02-04 MED ORDER — MONTELUKAST SODIUM 10 MG PO TABS: 10.0000 mg | ORAL_TABLET | Freq: Every day | ORAL | 3 refills | Status: DC

## 2019-02-04 MED ORDER — PIOGLITAZONE HCL 45 MG PO TABS: ORAL_TABLET | ORAL | 3 refills | Status: DC

## 2019-02-04 MED ORDER — PRAVASTATIN SODIUM 40 MG PO TABS: ORAL_TABLET | ORAL | 3 refills | Status: DC

## 2019-02-04 MED ORDER — METFORMIN HCL ER 500 MG PO TB24: 1500.0000 mg | ORAL_TABLET | Freq: Every day | ORAL | 3 refills | Status: DC

## 2019-02-04 MED ORDER — CANAGLIFLOZIN 100 MG PO TABS: 100.0000 mg | ORAL_TABLET | Freq: Every day | ORAL | 3 refills | Status: DC

## 2019-02-04 MED ORDER — LOSARTAN POTASSIUM 50 MG PO TABS: ORAL_TABLET | ORAL | 3 refills | Status: DC

## 2019-02-04 NOTE — Progress Notes (Signed)
Chief Complaint  Patient presents with  . Follow-up    Pt is here today for a 62-month-F/U.  Fasting labs completed on 7.14.20.  She also would like for you to check her right ear for tinnitis.    History of Present Illness: HPI    68 year old female presents for DM follow up  Has noted  1 week of ringing in  Right ear and, some bloody nasal discharge from right nostril. Occ congestion. No facial pain.  On singulair for allergies, and floanse.  Diabetes:  Improving control!  Invokana, actos and metfromin. Lab Results  Component Value Date   HGBA1C 6.6 (H) 02/01/2019  Using medications without difficulties: Hypoglycemic episodes: Hyperglycemic episodes: Feet problems:no ulcers. Blood Sugars averaging: FBS 11-132 eye exam within last year:  Elevated Cholesterol:  At goal LDL on pravastatin Lab Results  Component Value Date   CHOL 193 02/01/2019   HDL 71.20 02/01/2019   LDLCALC 88 02/01/2019   LDLDIRECT 104.0 09/04/2017   TRIG 167.0 (H) 02/01/2019   CHOLHDL 3 02/01/2019  Using medications without problems: Muscle aches:  Diet compliance: good Exercise: minimal, walking Other complaints:  Wt Readings from Last 3 Encounters:  02/04/19 233 lb 12.8 oz (106.1 kg)  11/19/18 236 lb 1 oz (107.1 kg)  11/18/18 237 lb 12 oz (107.8 kg)     COVID 19 screen No recent travel or known exposure to [place] The patient denies respiratory symptoms of COVID 19 at this time.  The importance of social distancing was discussed today.   Review of Systems  Constitutional: Negative for chills and fever.  HENT: Negative for congestion and ear pain.   Eyes: Negative for pain and redness.  Respiratory: Negative for cough and shortness of breath.   Cardiovascular: Negative for chest pain, palpitations and leg swelling.  Gastrointestinal: Negative for abdominal pain, blood in stool, constipation, diarrhea, nausea and vomiting.  Genitourinary: Negative for dysuria.  Musculoskeletal: Negative  for falls and myalgias.  Skin: Negative for rash.  Neurological: Negative for dizziness.  Psychiatric/Behavioral: Negative for depression. The patient is not nervous/anxious.       Past Medical History:  Diagnosis Date  . Allergic rhinitis    uses Flonase daily  . Anesthesia complication    Per pt/ past endoscopy in 2015, the anesthetic spray in back throat caused her to have breathing problems  . Anxiety   . Bronchitis   . Chicken pox   . Constipation    takes Fiber daily  . Depression   . Diverticulosis   . Dysphagia   . GERD (gastroesophageal reflux disease)    takes Protonix daily  . H/O hiatal hernia   . Hemorrhoids   . History of bronchitis    2013  . History of colon polyps   . History of kidney stones   . Hyperlipidemia    lost 43 pounds and no meds required at present  . Hypertension    takes Verapamil and HCTZ daily  . Hypokalemia   . Hypothyroidism (acquired)    takes Synthroid daily  . Insomnia    doesn't take any meds for this  . Joint swelling    left thumb  . Migraines    last one about a month ago  . Non-insulin dependent type 2 diabetes mellitus ([place])    takes Glipizide daily  . OA (osteoarthritis)    left knee  . Oxygen deficiency   . Oxygen dependent    Pt is on continuous O2 at 3  liters!  . Pneumonia    last time in 2006  . PONV (postoperative nausea and vomiting)   . Shortness of breath    with exertion;takes Singulair daily as well as Flonase  . Urinary frequency     reports that she has never smoked. She has never used smokeless tobacco. She reports that she does not drink alcohol or use drugs.   Current Outpatient Medications:  .  acetaminophen (TYLENOL) 500 MG tablet, Take 1,000 mg by mouth every 6 (six) hours as needed for moderate pain., Disp: , Rfl:  .  canagliflozin (INVOKANA) 100 MG TABS tablet, Take 1 tablet (100 mg total) by mouth daily before breakfast., Disp: 90 tablet, Rfl: 3 .  FIBER PO, Take 1 tablet by mouth daily.,  Disp: , Rfl:  .  fluticasone (FLONASE) 50 MCG/ACT nasal spray, Place 1 spray into both nostrils daily., Disp: , Rfl:  .  hydrochlorothiazide (HYDRODIURIL) 12.5 MG tablet, TAKE 1 TABLET DAILY ALONG WITH ONE OF THE LOSARTAN 50 MG TABLETS, Disp: 90 tablet, Rfl: 0 .  levothyroxine (SYNTHROID) 125 MCG tablet, TAKE 1 TABLET BY MOUTH  DAILY BEFORE BREAKFAST, Disp: 90 tablet, Rfl: 3 .  losartan (COZAAR) 50 MG tablet, TAKE 1 TABLET DAILY ALONG WITH 1 OF THE HCTZ 12.5MG  TABLETS, Disp: 90 tablet, Rfl: 0 .  losartan-hydrochlorothiazide (HYZAAR) 50-12.5 MG tablet, Take 1 tablet by mouth daily., Disp: 90 tablet, Rfl: 1 .  metFORMIN (GLUCOPHAGE-XR) 500 MG 24 hr tablet, Take 3 tablets (1,500 mg total) by mouth daily with breakfast., Disp: 270 tablet, Rfl: 3 .  montelukast (SINGULAIR) 10 MG tablet, Take 1 tablet (10 mg total) by mouth at bedtime., Disp: 90 tablet, Rfl: 0 .  mycophenolate (CELLCEPT) 500 MG tablet, TAKE 2 TABLETS BY MOUTH  TWICE DAILY, Disp: 360 tablet, Rfl: 0 .  NON FORMULARY, Place 4 L into the nose daily. 10 Liters with exertion, Disp: , Rfl:  .  nystatin cream (MYCOSTATIN), Apply 1 application topically 3 (three) times daily., Disp: 30 g, Rfl: 0 .  ONETOUCH ULTRA test strip, CHECK BLOOD SUGAR DAILY., Disp: 100 strip, Rfl: 0 .  pantoprazole (PROTONIX) 40 MG tablet, Take 1 tablet (40 mg total) by mouth 2 (two) times daily., Disp: 180 tablet, Rfl: 1 .  pioglitazone (ACTOS) 45 MG tablet, Take 1 tablet (45 mg total) by mouth daily., Disp: 90 tablet, Rfl: 3 .  Pirfenidone (ESBRIET) 801 MG TABS, Take 801 mg by mouth 3 (three) times daily., Disp: 180 tablet, Rfl: 6 .  pravastatin (PRAVACHOL) 40 MG tablet, Take 1 tablet (40 mg total) by mouth daily., Disp: 90 tablet, Rfl: 0 .  verapamil (CALAN-SR) 180 MG CR tablet, Take 1 tablet (180 mg total) by mouth daily., Disp: 90 tablet, Rfl: 1   Observations/Objective: Blood pressure 122/60, pulse 77, temperature 98 F (36.7 C), temperature source Oral, height 5'  7" (1.702 m), weight 233 lb 12.8 oz (106.1 kg), SpO2 99 %.  Physical Exam Constitutional:      General: She is not in acute distress.    Appearance: Normal appearance. She is well-developed. She is not ill-appearing, toxic-appearing or diaphoretic.  HENT:     Head: Normocephalic.     Right Ear: Hearing, tympanic membrane, ear canal and external ear normal. Tympanic membrane is not erythematous, retracted or bulging.     Left Ear: Hearing, tympanic membrane, ear canal and external ear normal. Tympanic membrane is not erythematous, retracted or bulging.     Nose: No mucosal edema or rhinorrhea.  Right Sinus: No maxillary sinus tenderness or frontal sinus tenderness.     Left Sinus: No maxillary sinus tenderness or frontal sinus tenderness.     Mouth/Throat:     Pharynx: Uvula midline.  Eyes:     General: Lids are normal. Lids are everted, no foreign bodies appreciated.     Conjunctiva/sclera: Conjunctivae normal.     Pupils: Pupils are equal, round, and reactive to light.  Neck:     Musculoskeletal: Normal range of motion and neck supple.     Thyroid: No thyroid mass or thyromegaly.     Vascular: No carotid bruit.     Trachea: Trachea normal.  Cardiovascular:     Rate and Rhythm: Normal rate and regular rhythm.     Pulses: Normal pulses.     Heart sounds: Normal heart sounds, S1 normal and S2 normal. No murmur. No friction rub. No gallop.   Pulmonary:     Effort: Pulmonary effort is normal. No tachypnea or respiratory distress.     Breath sounds: Normal breath sounds. No decreased breath sounds, wheezing, rhonchi or rales.  Abdominal:     General: Bowel sounds are normal.     Palpations: Abdomen is soft.     Tenderness: There is no abdominal tenderness.  Skin:    General: Skin is warm and dry.     Findings: No rash.  Neurological:     Mental Status: She is alert.  Psychiatric:        Mood and Affect: Mood is not anxious or depressed.        Speech: Speech normal.         Behavior: Behavior normal. Behavior is cooperative.        Thought Content: Thought content normal.        Judgment: Judgment normal.    Diabetic foot exam: Normal inspection No skin breakdown No calluses  Normal DP pulses Normal sensation to light touch and monofilament Nails normal   Assessment and Plan      [name], MD

## 2019-02-04 NOTE — Assessment & Plan Note (Signed)
Increase floanse to 2 sprays per nostril daily.

## 2019-02-04 NOTE — Assessment & Plan Note (Signed)
Followed by pulmonary 

## 2019-02-04 NOTE — Assessment & Plan Note (Signed)
Excellent control on current regimen.

## 2019-02-04 NOTE — Assessment & Plan Note (Signed)
Well controlled. Continue current medication.  on statin. 

## 2019-02-04 NOTE — Assessment & Plan Note (Signed)
Well controlled. Continue current medication.  

## 2019-02-04 NOTE — Patient Instructions (Signed)
Increase flonase to 2 spray per nostril daily.

## 2019-02-07 NOTE — Telephone Encounter (Signed)
Kaitlin Hamilton, please advise if you have received paperwork from [employer] on pt? If you have not, will check with Cherina to see if she received paperwork.

## 2019-02-07 NOTE — Telephone Encounter (Signed)
I have not received anything on this patient.

## 2019-02-07 NOTE — Telephone Encounter (Signed)
I have not received anything for this patient. I'm not sure where they are faxing the form

## 2019-02-07 NOTE — Telephone Encounter (Signed)
Cherina, please advise if you have received any forms on pt from [employer]. Thanks!

## 2019-02-07 NOTE — Telephone Encounter (Signed)
Attempted to call [employer] but unable to reach anyone. Left message for [employer] to return call so we can follow up on this as we have not received fax yet.

## 2019-02-08 ENCOUNTER — Other Ambulatory Visit: Payer: Self-pay | Admitting: Family Medicine

## 2019-02-08 NOTE — Telephone Encounter (Signed)
Attempted to call [employer] but received message stating that all agents were unavailable. Left message for someone from [employer] to call us back.

## 2019-02-08 NOTE — Telephone Encounter (Signed)
Received the following message from patient:   "Per previous messages, Kaitlin Hamilton has completed her blood work (although they did not do a CBC) with her PCP last week.  She will not have another PCP appointment and labs monitored until December.  Would Dr. [name] still like for her labs to be drawn every 3 months for medication monitoring?  Would she need a pulmonary clinic visit in the next upcoming months? Her symptoms have remained unchanged aside from increased coughing with eating, which is expected.  She has had no further issues with infections and has been maintaining physical distancing measures other than having friends deliver her groceries and supplies (but they do not do so if they have been sick or had a sick contact).    Thank you!  We appreciate all that you do, Kaitlin Hamilton on behalf of Kaitlin Hamilton"  Patient is currently on Esbriet and Cellcept. She has been on the [place] since at least 2018 and has been on Cellcept since at least 2015. She had her blood drawn last week but a CBC was not done. She wants to know if she still needs to have therapeutic drug therapy monitoring every 3 months.   TP, please advise. Thank you!

## 2019-02-08 NOTE — Telephone Encounter (Signed)
The only order that I have seen for this patient from [employer] was in [place] and it was just for tubing. I have signed off on that form but not sure if there is another order somewhere else

## 2019-02-08 NOTE — Telephone Encounter (Signed)
Yes she needs a visit and labs  If she passes covid screening can have in office visit with one of the APP for follow up and labs

## 2019-02-09 NOTE — Telephone Encounter (Signed)
Call made to [place] with [employer], I made her aware no paper work has been received as of yet. She states she will re-fax. Will await forms.

## 2019-02-10 NOTE — Telephone Encounter (Signed)
I have not received anything else for this patient

## 2019-02-10 NOTE — Telephone Encounter (Signed)
Kaitlin Hamilton did you receive this yet?

## 2019-02-14 NOTE — Telephone Encounter (Signed)
Unsure if there is still something needed as Rodena Piety already sent back a form for the pt's CPAP tubing. Rodena Piety has not received anything further. Called [employer] and left a VM to call back to see if anything further is needed.

## 2019-02-16 NOTE — Telephone Encounter (Signed)
Paperwork had been faxed back to [employer] for pt by Rodena Piety. I have sent Kaitlin Hamilton a community message to see if she can let us know if anything else is needed on pt or if all had been taken care of. Will await a response from her.

## 2019-02-18 NOTE — Telephone Encounter (Signed)
Will route to EP and AW to follow up if any response from [facility] Dy has been received.

## 2019-02-21 ENCOUNTER — Other Ambulatory Visit: Payer: Self-pay

## 2019-02-21 ENCOUNTER — Ambulatory Visit (INDEPENDENT_AMBULATORY_CARE_PROVIDER_SITE_OTHER): Payer: Medicare Other | Admitting: Family Medicine

## 2019-02-21 ENCOUNTER — Encounter: Payer: Self-pay | Admitting: Family Medicine

## 2019-02-21 DIAGNOSIS — B349 Viral infection, unspecified: Secondary | ICD-10-CM

## 2019-02-21 DIAGNOSIS — R112 Nausea with vomiting, unspecified: Secondary | ICD-10-CM | POA: Diagnosis not present

## 2019-02-21 DIAGNOSIS — R6889 Other general symptoms and signs: Secondary | ICD-10-CM | POA: Diagnosis not present

## 2019-02-21 DIAGNOSIS — R509 Fever, unspecified: Secondary | ICD-10-CM | POA: Diagnosis not present

## 2019-02-21 DIAGNOSIS — R197 Diarrhea, unspecified: Secondary | ICD-10-CM | POA: Diagnosis not present

## 2019-02-21 DIAGNOSIS — Z20822 Contact with and (suspected) exposure to covid-19: Secondary | ICD-10-CM

## 2019-02-21 NOTE — Progress Notes (Signed)
Virtual Visit via Video Note  I connected with Kaitlin Hamilton on 02/21/19 at  9:00 AM EDT by a video enabled telemedicine application and verified that I am speaking with the correct person using two identifiers.  Location: Patient: home Provider: office    I discussed the limitations of evaluation and management by telemedicine and the availability of in person appointments. The patient expressed understanding and agreed to proceed.  History of Present Illness: 68 yo pt of Dr [name] presents with fever and GI/URI symptoms Would like a covid test   Has 02 chronic Also DM    Thursday - upset stomach  Headaches  Low grade fever 99.8   Friday- n/v and low grade fever /continued headache   Sat/sun no fever Still some headache  Nose is running constantly  Cough is chronic-no more than usual  Turned her 02 up a bit- on 4.5 L now  Baseline- sob with talking and activity  She is on cellcept from pulmonary for her interstitial pneumonitis   Glucose today 179 Has been "good"   GI symptoms are improved today  Nausea and vomiting is gone  No more diarrhea  Trying to get in more fluids - 54 oz of water (and some tea) yesterday    Has an ulcer on roof of the mouth (not unusual)    She was in contact to person a week ago Friday (in the home)  She found out she had covid the next day   Temp 96.6 today - much better   Patient Active Problem List   Diagnosis Date Noted  . Fever 02/21/2019  . Nausea vomiting and diarrhea 02/21/2019  . Viral syndrome 02/21/2019  . Acute diarrhea 11/04/2018  . Acute diverticulitis 08/16/2018  . Uncontrolled type 2 diabetes mellitus with hyperglycemia ([place]) 07/20/2018  . MDD (recurrent major depressive disorder) in remission ([place]) 12/08/2017  . Chronic insomnia 12/08/2017  . [place] intertrigo 07/08/2016  . Right sciatic nerve pain 03/09/2015  . Chronic pain of left thumb 03/09/2015  . Allergic rhinitis 10/06/2014  . Chronic hypoxemic  respiratory failure ([place]) 01/31/2014  . Cystocele 11/11/2013  . Hypertension associated with diabetes ([place]) 08/25/2013  . Hyperlipidemia associated with type 2 diabetes mellitus ([place]) 08/25/2013  . Common migraine 08/25/2013  . Hypothyroidism 08/25/2013  . OSA (obstructive sleep apnea) 08/22/2013  . GERD (gastroesophageal reflux disease) 06/01/2013  . Esophageal dysmotility 05/17/2013  . UIP (usual interstitial pneumonitis) ([place]) 10/19/2012   Past Medical History:  Diagnosis Date  . Allergic rhinitis    uses Flonase daily  . Anesthesia complication    Per pt/ past endoscopy in 2015, the anesthetic spray in back throat caused her to have breathing problems  . Anxiety   . Bronchitis   . Chicken pox   . Constipation    takes Fiber daily  . Depression   . Diverticulosis   . Dysphagia   . GERD (gastroesophageal reflux disease)    takes Protonix daily  . H/O hiatal hernia   . Hemorrhoids   . History of bronchitis    2013  . History of colon polyps   . History of kidney stones   . Hyperlipidemia    lost 43 pounds and no meds required at present  . Hypertension    takes Verapamil and HCTZ daily  . Hypokalemia   . Hypothyroidism (acquired)    takes Synthroid daily  . Insomnia    doesn't take any meds for this  . Joint swelling  left thumb  . Migraines    last one about a month ago  . Non-insulin dependent type 2 diabetes mellitus ([place])    takes Glipizide daily  . OA (osteoarthritis)    left knee  . Oxygen deficiency   . Oxygen dependent    Pt is on continuous O2 at 3 liters!  . Pneumonia    last time in 2006  . PONV (postoperative nausea and vomiting)   . Shortness of breath    with exertion;takes Singulair daily as well as Flonase  . Urinary frequency    Past Surgical History:  Procedure Laterality Date  . ABDOMINAL EXPLORATION SURGERY     For Ovarian Cyst   . APPENDECTOMY    . CHOLECYSTECTOMY    . COLONOSCOPY  05/2013   TA, severe diverticulosis, rpt 3  yrs ([place])  . ENTEROSCOPY N/A 08/12/2013   Procedure: ENTEROSCOPY;  Surgeon: Lafayette Dragon, MD;  Location: [facility];  Service: Endoscopy;  Laterality: N/A;  . ESOPHAGOGASTRODUODENOSCOPY    . left knee arthroscopy    . LITHOTRIPSY     x 2  . LUNG BIOPSY Right 06/29/2013   Procedure: LUNG BIOPSY;  Surgeon: Melrose Nakayama, MD;  Location: [facility];  Service: Thoracic;  Laterality: Right;  . TCS    . TUBAL LIGATION    . VIDEO ASSISTED THORACOSCOPY Right 06/29/2013   Procedure: VIDEO ASSISTED THORACOSCOPY;  Surgeon: Melrose Nakayama, MD;  Location: [facility];  Service: Thoracic;  Laterality: Right;   Social History   Tobacco Use  . Smoking status: Never Smoker  . Smokeless tobacco: Never Used  Substance Use Topics  . Alcohol use: No    Alcohol/week: 0.0 standard drinks  . Drug use: No   Family History  Problem Relation Age of Onset  . Rheum arthritis Mother   . Rheum arthritis Sister   . Prostate cancer Brother   . Heart disease Brother   . Heart disease Maternal Grandmother   . Heart disease Maternal Grandfather   . Uterine cancer Daughter   . Colon cancer Neg Hx   . Breast cancer Neg Hx    Allergies  Allergen Reactions  . Adhesive [Tape]     Redness, bruising with any adhesives- bandaids, patches, etc.   . Atovaquone Itching  . Codeine Hives and Swelling  . Demerol [Meperidine] Hives and Swelling  . Hydrocodone Nausea Only  . Trazodone And Nefazodone Itching  . Sulfa Antibiotics Rash   Current Outpatient Medications on File Prior to Visit  Medication Sig Dispense Refill  . acetaminophen (TYLENOL) 500 MG tablet Take 1,000 mg by mouth every 6 (six) hours as needed for moderate pain.    . canagliflozin (INVOKANA) 100 MG TABS tablet Take 1 tablet (100 mg total) by mouth daily before breakfast. 90 tablet 3  . FIBER PO Take 1 tablet by mouth daily.    . fluticasone (FLONASE) 50 MCG/ACT nasal spray Place 1 spray into both nostrils daily.    . hydrochlorothiazide  (HYDRODIURIL) 12.5 MG tablet TAKE 1 TABLET DAILY ALONG WITH ONE OF THE LOSARTAN 50 MG TABLETS 90 tablet 3  . levothyroxine (SYNTHROID) 125 MCG tablet TAKE 1 TABLET BY MOUTH  DAILY BEFORE BREAKFAST 90 tablet 3  . losartan (COZAAR) 50 MG tablet TAKE 1 TABLET DAILY ALONG WITH 1 OF THE HCTZ 12.5MG  TABLETS 90 tablet 3  . losartan-hydrochlorothiazide (HYZAAR) 50-12.5 MG tablet Take 1 tablet by mouth daily. 90 tablet 1  . metFORMIN (GLUCOPHAGE-XR) 500 MG 24 hr  tablet Take 3 tablets (1,500 mg total) by mouth daily with breakfast. 270 tablet 3  . montelukast (SINGULAIR) 10 MG tablet Take 1 tablet (10 mg total) by mouth at bedtime. 90 tablet 3  . mycophenolate (CELLCEPT) 500 MG tablet TAKE 2 TABLETS BY MOUTH  TWICE DAILY 360 tablet 0  . NON FORMULARY Place 4 L into the nose daily. 10 Liters with exertion    . nystatin cream (MYCOSTATIN) Apply 1 application topically 3 (three) times daily. 30 g 0  . ONETOUCH ULTRA test strip CHECK BLOOD SUGAR DAILY. 100 strip 0  . pantoprazole (PROTONIX) 40 MG tablet TAKE 1 TABLET BY MOUTH TWO  TIMES DAILY 180 tablet 2  . pioglitazone (ACTOS) 45 MG tablet Take 1 tablet (45 mg total) by mouth daily. 90 tablet 3  . Pirfenidone (ESBRIET) 801 MG TABS Take 801 mg by mouth 3 (three) times daily. 180 tablet 6  . pravastatin (PRAVACHOL) 40 MG tablet Take 1qd 90 tablet 3  . verapamil (CALAN-SR) 180 MG CR tablet TAKE 1 TABLET BY MOUTH  DAILY 90 tablet 2   No current facility-administered medications on file prior to visit.    Review of Systems  Constitutional: Positive for fever and malaise/fatigue. Negative for chills.  HENT: Positive for congestion. Negative for ear pain, nosebleeds and sore throat.        Runny nose  Has a mouth ulcer on roof of mouth No loss of taste or smell  Eyes: Negative for discharge and redness.  Respiratory: Positive for cough. Negative for shortness of breath and wheezing.        Baseline cough  Sob on exertion- baseline for her (not during  interview)  Cardiovascular: Negative for chest pain.  Gastrointestinal: Negative for abdominal pain, nausea and vomiting.       N/v/d is resolved today  Stomach is sensitive  Musculoskeletal: Negative for myalgias.  Skin: Negative for rash.  Neurological: Positive for headaches. Negative for dizziness.      Observations/Objective: Patient appears well, in no distress but appears fatigued  Wearing 02 by [place] Weight is baseline (obese) No facial swelling or asymmetry Normal voice-not hoarse and no slurred speech No obvious tremor or mobility impairment Moving neck and UEs normally Able to hear the call well  No cough or shortness of breath during interview  Talkative and mentally sharp with no cognitive changes No skin changes on face or neck , no rash or pallor Affect is normal    Assessment and Plan: Problem List Items Addressed This Visit      Digestive   Nausea vomiting and diarrhea   Relevant Orders   Novel Coronavirus, NAA ([facility])     Other   Fever - Primary   Relevant Orders   Novel Coronavirus, NAA ([facility])   Viral syndrome    Low grade fever N/v/d- improved now  Rhinorrhea - some mild congestion  Headache   Exp to covid in her home about 10 d ago  Disc s/s of sinusitis to watch for and let us know  Ordered covid test today-she will [facility] for sob/cough in light of chronic lung dz          Follow Up Instructions: Drink fluids to make up for GI losses you had when you were sick  Gradually advance diet (bland to start)  I ordered a covid test-[facility] testing today before 3:30 pm Watch for increased cough or shortness of  breath   Rest  If worse sinus pain with colored nasal discharge let us know   Update if not starting to improve in a week or if worsening     I discussed the assessment and treatment plan with the patient. The patient was provided an opportunity to ask questions and all were  answered. The patient agreed with the plan and demonstrated an understanding of the instructions.   The patient was advised to call back or seek an in-person evaluation if the symptoms worsen or if the condition fails to improve as anticipated.   Loura Pardon, MD

## 2019-02-21 NOTE — Patient Instructions (Signed)
Drink fluids to make up for GI losses you had when you were sick  Gradually advance diet (bland to start)  I ordered a covid test-[facility] to [facility] testing today before 3:30 pm Watch for increased cough or shortness of breath   Rest  If worse sinus pain with colored nasal discharge let us know   Update if not starting to improve in a week or if worsening

## 2019-02-21 NOTE — Assessment & Plan Note (Signed)
Low grade fever N/v/d- improved now  Rhinorrhea - some mild congestion  Headache   Exp to covid in her home about 10 d ago  Disc s/s of sinusitis to watch for and let us know  Ordered covid test today-she will go get at [facility] closely for sob/cough in light of chronic lung dz

## 2019-02-21 NOTE — Telephone Encounter (Signed)
No update from [place] with [facility]. Levada Dy did say if anything was needed that she would let us know. Since Rodena Piety already has faxed the form for pt's supplies to [employer], believe that nothing further is needed. If I do receive a response from Levada Dy, will definitely put it in the encounter but at this time nothing further is needed.

## 2019-02-22 LAB — NOVEL CORONAVIRUS, NAA: SARS-CoV-2, NAA: NOT DETECTED

## 2019-03-01 DIAGNOSIS — G4733 Obstructive sleep apnea (adult) (pediatric): Secondary | ICD-10-CM | POA: Diagnosis not present

## 2019-03-05 DIAGNOSIS — J84111 Idiopathic interstitial pneumonia, not otherwise specified: Secondary | ICD-10-CM | POA: Diagnosis not present

## 2019-03-05 DIAGNOSIS — G4733 Obstructive sleep apnea (adult) (pediatric): Secondary | ICD-10-CM | POA: Diagnosis not present

## 2019-03-05 DIAGNOSIS — J8489 Other specified interstitial pulmonary diseases: Secondary | ICD-10-CM | POA: Diagnosis not present

## 2019-03-16 ENCOUNTER — Encounter: Payer: Self-pay | Admitting: Primary Care

## 2019-03-16 ENCOUNTER — Ambulatory Visit (INDEPENDENT_AMBULATORY_CARE_PROVIDER_SITE_OTHER): Payer: Medicare Other | Admitting: Primary Care

## 2019-03-16 ENCOUNTER — Other Ambulatory Visit: Payer: Self-pay

## 2019-03-16 VITALS — BP 124/64 | HR 85 | Temp 98.1°F | Ht 67.0 in | Wt 230.2 lb

## 2019-03-16 DIAGNOSIS — L292 Pruritus vulvae: Secondary | ICD-10-CM | POA: Insufficient documentation

## 2019-03-16 DIAGNOSIS — Z23 Encounter for immunization: Secondary | ICD-10-CM

## 2019-03-16 DIAGNOSIS — N898 Other specified noninflammatory disorders of vagina: Secondary | ICD-10-CM | POA: Insufficient documentation

## 2019-03-16 DIAGNOSIS — R350 Frequency of micturition: Secondary | ICD-10-CM | POA: Diagnosis not present

## 2019-03-16 LAB — POC URINALSYSI DIPSTICK (AUTOMATED)
Bilirubin, UA: NEGATIVE
Blood, UA: NEGATIVE
Glucose, UA: POSITIVE — AB
Ketones, UA: NEGATIVE
Leukocytes, UA: NEGATIVE
Nitrite, UA: NEGATIVE
Protein, UA: NEGATIVE
Spec Grav, UA: 1.015 (ref 1.010–1.025)
Urobilinogen, UA: 0.2 E.U./dL
pH, UA: 5.5 (ref 5.0–8.0)

## 2019-03-16 MED ORDER — FLUCONAZOLE 150 MG PO TABS: 150.0000 mg | ORAL_TABLET | Freq: Once | ORAL | 0 refills | Status: AC

## 2019-03-16 NOTE — Progress Notes (Signed)
Subjective:    Patient ID: Kaitlin Hamilton, female    DOB: 07-26-1950, 67 y.o.   MRN: [id number]  HPI  Kaitlin Hamilton is a 68 year old female with a history of type 2 diabetes (managed on SGL2-I), hypertension managed on diuretic, cystocele, renal stones, UTI who presents today with a chief complaint of urinary frequency.  She also reports vaginal itching, dysuria. Symptoms began yesterday. She denies hematuria, fevers, abdominal pain, vaginal discharge.   Review of Systems  Constitutional: Negative for fever.  Gastrointestinal: Negative for abdominal pain.  Genitourinary: Positive for dysuria and frequency. Negative for hematuria, pelvic pain and vaginal discharge.       Vaginal itching       Past Medical History:  Diagnosis Date  . Allergic rhinitis    uses Flonase daily  . Anesthesia complication    Per pt/ past endoscopy in 2015, the anesthetic spray in back throat caused her to have breathing problems  . Anxiety   . Bronchitis   . Chicken pox   . Constipation    takes Fiber daily  . Depression   . Diverticulosis   . Dysphagia   . GERD (gastroesophageal reflux disease)    takes Protonix daily  . H/O hiatal hernia   . Hemorrhoids   . History of bronchitis    2013  . History of colon polyps   . History of kidney stones   . Hyperlipidemia    lost 43 pounds and no meds required at present  . Hypertension    takes Verapamil and HCTZ daily  . Hypokalemia   . Hypothyroidism (acquired)    takes Synthroid daily  . Insomnia    doesn't take any meds for this  . Joint swelling    left thumb  . Migraines    last one about a month ago  . Non-insulin dependent type 2 diabetes mellitus ([place])    takes Glipizide daily  . OA (osteoarthritis)    left knee  . Oxygen deficiency   . Oxygen dependent    Pt is on continuous O2 at 3 liters!  . Pneumonia    last time in 2006  . PONV (postoperative nausea and vomiting)   . Shortness of breath    with exertion;takes Singulair daily as  well as Flonase  . Urinary frequency      Social History   Socioeconomic History  . Marital status: Widowed    Spouse name: Not on file  . Number of children: 2  . Years of education: Not on file  . Highest education level: Not on file  Occupational History  . Occupation: Day Care at [employer]  . Financial resource strain: Not on file  . Food insecurity    Worry: Not on file    Inability: Not on file  . Transportation needs    Medical: Not on file    Non-medical: Not on file  Tobacco Use  . Smoking status: Never Smoker  . Smokeless tobacco: Never Used  Substance and Sexual Activity  . Alcohol use: No    Alcohol/week: 0.0 standard drinks  . Drug use: No  . Sexual activity: Never    Birth control/protection: Post-menopausal  Lifestyle  . Physical activity    Days per week: Not on file    Minutes per session: Not on file  . Stress: Not on file  Relationships  . Social Herbalist on phone: Not on file    Gets  together: Not on file    Attends religious service: Not on file    Active member of club or organization: Not on file    Attends meetings of clubs or organizations: Not on file    Relationship status: Not on file  . Intimate partner violence    Fear of current or ex partner: Not on file    Emotionally abused: Not on file    Physically abused: Not on file    Forced sexual activity: Not on file  Other Topics Concern  . Not on file  Social History Narrative   Daily caffeine     Exercise: none recently    Diet: poor    Past Surgical History:  Procedure Laterality Date  . ABDOMINAL EXPLORATION SURGERY     For Ovarian Cyst   . APPENDECTOMY    . CHOLECYSTECTOMY    . COLONOSCOPY  05/2013   TA, severe diverticulosis, rpt 3 yrs ([facility])  . ENTEROSCOPY N/A 08/12/2013   Procedure: ENTEROSCOPY;  Surgeon: Lafayette Dragon, MD;  Location: [facility];  Service: Endoscopy;  Laterality: N/A;  . ESOPHAGOGASTRODUODENOSCOPY    . left knee arthroscopy     . LITHOTRIPSY     x 2  . LUNG BIOPSY Right 06/29/2013   Procedure: LUNG BIOPSY;  Surgeon: Melrose Nakayama, MD;  Location: [facility];  Service: Thoracic;  Laterality: Right;  . TCS    . TUBAL LIGATION    . VIDEO ASSISTED THORACOSCOPY Right 06/29/2013   Procedure: VIDEO ASSISTED THORACOSCOPY;  Surgeon: Melrose Nakayama, MD;  Location: [place];  Service: Thoracic;  Laterality: Right;    Family History  Problem Relation Age of Onset  . Rheum arthritis Mother   . Rheum arthritis Sister   . Prostate cancer Brother   . Heart disease Brother   . Heart disease Maternal Grandmother   . Heart disease Maternal Grandfather   . Uterine cancer Daughter   . Colon cancer Neg Hx   . Breast cancer Neg Hx     Allergies  Allergen Reactions  . Adhesive [Tape]     Redness, bruising with any adhesives- bandaids, patches, etc.   . Atovaquone Itching  . Codeine Hives and Swelling  . Demerol [Meperidine] Hives and Swelling  . Hydrocodone Nausea Only  . Trazodone And Nefazodone Itching  . Sulfa Antibiotics Rash    Current Outpatient Medications on File Prior to Visit  Medication Sig Dispense Refill  . acetaminophen (TYLENOL) 500 MG tablet Take 1,000 mg by mouth every 6 (six) hours as needed for moderate pain.    . canagliflozin (INVOKANA) 100 MG TABS tablet Take 1 tablet (100 mg total) by mouth daily before breakfast. 90 tablet 3  . FIBER PO Take 1 tablet by mouth daily.    . fluticasone (FLONASE) 50 MCG/ACT nasal spray Place 1 spray into both nostrils daily.    . hydrochlorothiazide (HYDRODIURIL) 12.5 MG tablet TAKE 1 TABLET DAILY ALONG WITH ONE OF THE LOSARTAN 50 MG TABLETS 90 tablet 3  . levothyroxine (SYNTHROID) 125 MCG tablet TAKE 1 TABLET BY MOUTH  DAILY BEFORE BREAKFAST 90 tablet 3  . losartan (COZAAR) 50 MG tablet TAKE 1 TABLET DAILY ALONG WITH 1 OF THE HCTZ 12.5MG  TABLETS 90 tablet 3  . losartan-hydrochlorothiazide (HYZAAR) 50-12.5 MG tablet Take 1 tablet by mouth daily. 90 tablet 1  .  metFORMIN (GLUCOPHAGE-XR) 500 MG 24 hr tablet Take 3 tablets (1,500 mg total) by mouth daily with breakfast. 270 tablet 3  . montelukast (  SINGULAIR) 10 MG tablet Take 1 tablet (10 mg total) by mouth at bedtime. 90 tablet 3  . mycophenolate (CELLCEPT) 500 MG tablet TAKE 2 TABLETS BY MOUTH  TWICE DAILY 360 tablet 0  . NON FORMULARY Place 4 L into the nose daily. 10 Liters with exertion    . nystatin cream (MYCOSTATIN) Apply 1 application topically 3 (three) times daily. 30 g 0  . ONETOUCH ULTRA test strip CHECK BLOOD SUGAR DAILY. 100 strip 0  . pantoprazole (PROTONIX) 40 MG tablet TAKE 1 TABLET BY MOUTH TWO  TIMES DAILY 180 tablet 2  . pioglitazone (ACTOS) 45 MG tablet Take 1 tablet (45 mg total) by mouth daily. 90 tablet 3  . Pirfenidone (ESBRIET) 801 MG TABS Take 801 mg by mouth 3 (three) times daily. 180 tablet 6  . pravastatin (PRAVACHOL) 40 MG tablet Take 1qd 90 tablet 3  . verapamil (CALAN-SR) 180 MG CR tablet TAKE 1 TABLET BY MOUTH  DAILY 90 tablet 2   No current facility-administered medications on file prior to visit.     BP 124/64   Pulse 85   Temp 98.1 F (36.7 C) (Temporal)   Ht 5\' 7"  (1.702 m)   Wt 230 lb 4 oz (104.4 kg)   SpO2 99%   BMI 36.06 kg/m    Objective:   Physical Exam  Constitutional: She appears well-nourished.  Respiratory: Effort normal.  GI: Soft. There is no abdominal tenderness.  Genitourinary: Cervix exhibits no motion tenderness and no discharge.    No vaginal discharge or erythema.  No erythema in the vagina.           Assessment & Plan:

## 2019-03-16 NOTE — Assessment & Plan Note (Signed)
Symptoms for the last 24 hours, also with urinary frequency.  UA today with 3+ glucose, otherwise unremarkable.  Suspect vaginal yeast infection as it is a common side effect of SGL2-I medications. She appears very uncomfortable today.  Vaginal exam without obvious cause. Given symptoms and known side effects of SGL2-I we will treat with fluconazole x 1. Urine culture and wet prep pending.

## 2019-03-16 NOTE — Assessment & Plan Note (Addendum)
Present over the last 24 hours, no other symptoms except for vaginal itching.  UA today is negative for leuks, nitrites, and blood. 3+ glucose. Culture pending.  Will await results. Could be secondary to SLG2-1 and/or HCTZ.

## 2019-03-16 NOTE — Addendum Note (Signed)
Addended by: Jacqualin Combes on: 03/16/2019 12:55 PM   Modules accepted: Orders

## 2019-03-16 NOTE — Patient Instructions (Signed)
You can take the fluconazole tablet today for the vaginal itching.  You can also apply hydrocortisone cream to the vagina once daily for 3-4 days.   We will be in touch with the urine culture results once received.  It was a pleasure meeting you!

## 2019-03-17 ENCOUNTER — Ambulatory Visit: Payer: Medicare Other

## 2019-03-18 LAB — URINE CULTURE
MICRO NUMBER:: 814121
SPECIMEN QUALITY:: ADEQUATE

## 2019-03-18 LAB — WET PREP BY MOLECULAR PROBE
Candida species: NOT DETECTED
Gardnerella vaginalis: NOT DETECTED
MICRO NUMBER:: 814120
SPECIMEN QUALITY:: ADEQUATE
Trichomonas vaginosis: NOT DETECTED

## 2019-03-21 ENCOUNTER — Other Ambulatory Visit: Payer: Self-pay | Admitting: Family Medicine

## 2019-03-21 DIAGNOSIS — Z1231 Encounter for screening mammogram for malignant neoplasm of breast: Secondary | ICD-10-CM

## 2019-04-04 ENCOUNTER — Other Ambulatory Visit: Payer: Self-pay | Admitting: Pulmonary Disease

## 2019-04-05 DIAGNOSIS — G4733 Obstructive sleep apnea (adult) (pediatric): Secondary | ICD-10-CM | POA: Diagnosis not present

## 2019-04-05 DIAGNOSIS — J84111 Idiopathic interstitial pneumonia, not otherwise specified: Secondary | ICD-10-CM | POA: Diagnosis not present

## 2019-04-05 DIAGNOSIS — J8489 Other specified interstitial pulmonary diseases: Secondary | ICD-10-CM | POA: Diagnosis not present

## 2019-05-02 ENCOUNTER — Ambulatory Visit
Admission: RE | Admit: 2019-05-02 | Discharge: 2019-05-02 | Disposition: A | Discharge status: Home / Self Care | Payer: Medicare Other | Source: Ambulatory Visit | Attending: Family Medicine | Admitting: Family Medicine

## 2019-05-02 DIAGNOSIS — Z1231 Encounter for screening mammogram for malignant neoplasm of breast: Secondary | ICD-10-CM | POA: Diagnosis not present

## 2019-05-05 ENCOUNTER — Other Ambulatory Visit: Payer: Self-pay

## 2019-05-05 ENCOUNTER — Ambulatory Visit: Payer: Medicare Other | Admitting: Adult Health

## 2019-05-05 ENCOUNTER — Encounter: Payer: Self-pay | Admitting: Adult Health

## 2019-05-05 DIAGNOSIS — J9611 Chronic respiratory failure with hypoxia: Secondary | ICD-10-CM | POA: Diagnosis not present

## 2019-05-05 DIAGNOSIS — G4733 Obstructive sleep apnea (adult) (pediatric): Secondary | ICD-10-CM | POA: Diagnosis not present

## 2019-05-05 DIAGNOSIS — J84112 Idiopathic pulmonary fibrosis: Secondary | ICD-10-CM | POA: Diagnosis not present

## 2019-05-05 DIAGNOSIS — J849 Interstitial pulmonary disease, unspecified: Secondary | ICD-10-CM | POA: Diagnosis not present

## 2019-05-05 DIAGNOSIS — J84111 Idiopathic interstitial pneumonia, not otherwise specified: Secondary | ICD-10-CM | POA: Diagnosis not present

## 2019-05-05 DIAGNOSIS — J8489 Other specified interstitial pulmonary diseases: Secondary | ICD-10-CM | POA: Diagnosis not present

## 2019-05-05 NOTE — Progress Notes (Signed)
'@Patient'  ID: Kaitlin Hamilton, female    DOB: Mar 07, 1951, 68 y.o.   MRN: [id number]  Chief Complaint  Patient presents with  . Follow-up    Referring provider: [name], MD  HPI: 68 yo female never smoker followed for ILD (UIP-bx proven) , OSA on CPAP   TEST/EVENTS :  February 2014 chest x-ray at [facility] normal   11/2012 CT chest >> ([facility] read) centrilobular nodules, interlobular septal thickening worse in bases and periphery R lung > L; some GGO in bases and periphery as well, some bronchiectasis in the bases R > L; findings suggestive of fibrosis but not UIP; question hypersensitivity pneumonitis given centrilobular nodules; also question aspiration  03/2013 ANA, ANCA, Anti-Jo-1, ESR, RF, SCL-70, anti-centromere, SSA/SSB, all negative; CRP 0.6;  04/2013 Barium swallow> abnormal esophageal motility, GERD, hiatal hernia  04/2013 CT chest ([name])> findings suggestive of but not diagnostic of NSIP, small pulmonary nodule  04/2013 HP panel >> 05/17/2013 3 month prednisone trial 06/2013 open lung biopsy> UIP   February 2016 CT chest high resolution> slight progression in reticular abnormalities consistent with pulmonary fibrosis, uip  6 minute walk 10/19/2012 walked 500 feet in office on room air oxygenation did not drop below 90% 04/2013 6MW RA > 1100 feet, HR peak 109, O2 sat Nadir 87% 11/2013 6MW 1364 feet, O2 saturation nadir 78% 08/21/14 6MW 1400 feet 90% on 8L  PFT February 2014 simple spirometry performed by her primary care physician>> ratio 90%, FEV1 1.71 L (64% predicted, FVC 1.83 L 55% predicted; flow volume loop is not consistent with obstruction 11/15/2012 Full PFT [name]> Ratio 87%, FEV1 2.00L > 2.07 with bronchodilator (3% change); TLC 3.44 L (65% pred), ERV 0.62 (57% pred), DLCO 15.1 ml/mmHg/min (59% pred) 04/2013 Full PFT> Ratio 93%, FEV1 1.92 L (71% pred), TLC 3.02L (56% pred), DLCO 15.94 (59% pred) May 2015 full pulmonary function test ratio 93%, FEV1  1.71 L (64% predicted, no change with bronchodilator), total lung capacity 2.67 L (49% predicted), DLCO 13.11 (40% predicted) 03/2015 PFT > Ratio 93%, FEV1 1.24L, FVC 1.33L (38% pred), TLC 2.56L (47% pred), DLCO 12.10 (44% pred) March 2017 pulmonary function testing ratio 93%, FVC (41% predicted), total lung capacity 2.34 L (43% protected), DLCO 10.53 (39% protected). November 2017 pulmonary function testing ratio 92%, FVC 1.26 L, 37% predicted, total lung capacity 2.37 L 44% predicted, DLCO 5. 02/27/2020 percent predicted  05/05/2019 Follow up : ILD , OSA  Patient returns for a follow-up.  Last seen January 2020.  Patient has known interstitial lung disease with UIP (biopsy-proven December 2014).  There has been strong concern for an underlying connective tissue disease and she has been maintained on CellCept.  Previously on steroids but weaned off.  Esbriet was added in 2016 due to progressive hypoxemia and declining lung function.  Since last visit regarding her breathing has been doing about the same.  At baseline she gets short of breath with heavy activity.  But tries to remain active.  She is able to do light housework with her oxygen on.  She is had no increased oxygen demands.  She is had no antibiotics or steroids since last visit for pulmonary issues.  She had routine lab work in July.  LFTs were normal.  She has a follow-up appointment in December for additional labs. She remains on oxygen 4 L at rest.  She uses 6 to 8 L with with activity. Patient has underlying obstructive sleep apnea is on nocturnal CPAP.  Doing well.  Allergies  Allergen Reactions  . Adhesive [Tape]     Redness, bruising with any adhesives- bandaids, patches, etc.   . Atovaquone Itching  . Codeine Hives and Swelling  . Demerol [Meperidine] Hives and Swelling  . Hydrocodone Nausea Only  . Trazodone And Nefazodone Itching  . Sulfa Antibiotics Rash    Immunization History  Administered Date(s) Administered   . Influenza Split 05/02/2005, 11/28/2014  . Influenza Whole 04/20/2012  . Influenza, High Dose Seasonal PF 04/16/2018  . Influenza,inj,Quad PF,6+ Mos 05/17/2013, 04/04/2014, 10/06/2014, 05/11/2015, 10/19/2015, 03/12/2016, 04/30/2017, 10/14/2017, 03/16/2019  . Influenza-Unspecified 04/20/2013  . Pneumococcal Conjugate-13 04/04/2014  . Pneumococcal Polysaccharide-23 04/20/2013, 04/16/2018  . Tdap 07/21/2009    Past Medical History:  Diagnosis Date  . Allergic rhinitis    uses Flonase daily  . Anesthesia complication    Per pt/ past endoscopy in 2015, the anesthetic spray in back throat caused her to have breathing problems  . Anxiety   . Bronchitis   . Chicken pox   . Constipation    takes Fiber daily  . Depression   . Diverticulosis   . Dysphagia   . GERD (gastroesophageal reflux disease)    takes Protonix daily  . H/O hiatal hernia   . Hemorrhoids   . History of bronchitis    2013  . History of colon polyps   . History of kidney stones   . Hyperlipidemia    lost 43 pounds and no meds required at present  . Hypertension    takes Verapamil and HCTZ daily  . Hypokalemia   . Hypothyroidism (acquired)    takes Synthroid daily  . Insomnia    doesn't take any meds for this  . Joint swelling    left thumb  . Migraines    last one about a month ago  . Non-insulin dependent type 2 diabetes mellitus ([place])    takes Glipizide daily  . OA (osteoarthritis)    left knee  . Oxygen deficiency   . Oxygen dependent    Pt is on continuous O2 at 3 liters!  . Pneumonia    last time in 2006  . PONV (postoperative nausea and vomiting)   . Shortness of breath    with exertion;takes Singulair daily as well as Flonase  . Urinary frequency     Tobacco History: Social History   Tobacco Use  Smoking Status Never Smoker  Smokeless Tobacco Never Used   Counseling given: Not Answered   Outpatient Medications Prior to Visit  Medication Sig Dispense Refill  . acetaminophen  (TYLENOL) 500 MG tablet Take 1,000 mg by mouth every 6 (six) hours as needed for moderate pain.    . canagliflozin (INVOKANA) 100 MG TABS tablet Take 1 tablet (100 mg total) by mouth daily before breakfast. 90 tablet 3  . FIBER PO Take 1 tablet by mouth daily.    . fluticasone (FLONASE) 50 MCG/ACT nasal spray Place 1 spray into both nostrils daily.    . hydrochlorothiazide (HYDRODIURIL) 12.5 MG tablet TAKE 1 TABLET DAILY ALONG WITH ONE OF THE LOSARTAN 50 MG TABLETS 90 tablet 3  . levothyroxine (SYNTHROID) 125 MCG tablet TAKE 1 TABLET BY MOUTH  DAILY BEFORE BREAKFAST 90 tablet 3  . losartan (COZAAR) 50 MG tablet TAKE 1 TABLET DAILY ALONG WITH 1 OF THE HCTZ 12.5MG TABLETS 90 tablet 3  . losartan-hydrochlorothiazide (HYZAAR) 50-12.5 MG tablet Take 1 tablet by mouth daily. 90 tablet 1  . metFORMIN (GLUCOPHAGE-XR) 500 MG 24 hr tablet Take 3  tablets (1,500 mg total) by mouth daily with breakfast. 270 tablet 3  . montelukast (SINGULAIR) 10 MG tablet Take 1 tablet (10 mg total) by mouth at bedtime. 90 tablet 3  . mycophenolate (CELLCEPT) 500 MG tablet TAKE 2 TABLETS BY MOUTH  TWICE DAILY 360 tablet 0  . NON FORMULARY Place 4 L into the nose daily. 10 Liters with exertion    . nystatin cream (MYCOSTATIN) Apply 1 application topically 3 (three) times daily. 30 g 0  . ONETOUCH ULTRA test strip CHECK BLOOD SUGAR DAILY. 100 strip 0  . pantoprazole (PROTONIX) 40 MG tablet TAKE 1 TABLET BY MOUTH TWO  TIMES DAILY 180 tablet 2  . pioglitazone (ACTOS) 45 MG tablet Take 1 tablet (45 mg total) by mouth daily. 90 tablet 3  . Pirfenidone (ESBRIET) 801 MG TABS Take 801 mg by mouth 3 (three) times daily. 180 tablet 6  . pravastatin (PRAVACHOL) 40 MG tablet Take 1qd 90 tablet 3  . verapamil (CALAN-SR) 180 MG CR tablet TAKE 1 TABLET BY MOUTH  DAILY 90 tablet 2   No facility-administered medications prior to visit.      Review of Systems:   Constitutional:   No  weight loss, night sweats,  Fevers, chills,  +fatigue,  or  lassitude.  HEENT:   No headaches,  Difficulty swallowing,  Tooth/dental problems, or  Sore throat,                No sneezing, itching, ear ache, nasal congestion, post nasal drip,   CV:  No chest pain,  Orthopnea, PND, swelling in lower extremities, anasarca, dizziness, palpitations, syncope.   GI  No heartburn, indigestion, abdominal pain, nausea, vomiting, diarrhea, change in bowel habits, loss of appetite, bloody stools.   Resp:  .  No wheezing.  No chest wall deformity  Skin: no rash or lesions.  GU: no dysuria, change in color of urine, no urgency or frequency.  No flank pain, no hematuria   MS:  No joint pain or swelling.  No decreased range of motion.  No back pain.    Physical Exam  BP 122/62 (BP Location: Left Arm, Cuff Size: Normal)   Pulse 71   Temp (!) 97 F (36.1 C) (Temporal)   Ht '5\' 6"'  (1.676 m)   Wt 230 lb (104.3 kg)   SpO2 97%   BMI 37.12 kg/m   GEN: A/Ox3; pleasant , NAD, obese per BMI    HEENT:  [place]/AT,  NOSE-clear, THROAT-clear, no lesions, no postnasal drip or exudate noted.   NECK:  Supple w/ fair ROM; no JVD; normal carotid impulses w/o bruits; no thyromegaly or nodules palpated; no lymphadenopathy.    RESP  Bilateral crackles ,  no accessory muscle use, no dullness to percussion  CARD:  RRR, no m/r/g, no peripheral edema, pulses intact, no cyanosis or clubbing.  GI:   Soft & nt; nml bowel sounds; no organomegaly or masses detected.   Musco: Warm bil, no deformities or joint swelling noted.   Neuro: alert, no focal deficits noted.    Skin: Warm, no lesions or rashes    Lab Results:  CBC    Component Value Date/Time   WBC 11.9 (H) 11/18/2018 1039   RBC 4.23 11/18/2018 1039   HGB 12.3 11/18/2018 1039   HGB 10.9 (L) 07/26/2014 1229   HCT 38.5 11/18/2018 1039   HCT 35.6 07/26/2014 1229   PLT 266.0 11/18/2018 1039   PLT 354 07/26/2014 1229   MCV 91.1 11/18/2018 1039  MCV 101 (H) 07/26/2014 1229   MCH 30.8 07/26/2014 1229   MCH  31.5 04/27/2014 1915   MCHC 32.0 11/18/2018 1039   RDW 14.8 11/18/2018 1039   RDW 17.4 (H) 07/26/2014 1229   LYMPHSABS 1.2 11/18/2018 1039   LYMPHSABS 2.7 07/26/2014 1229   MONOABS 0.8 11/18/2018 1039   MONOABS 0.7 07/26/2014 1229   EOSABS 0.1 11/18/2018 1039   EOSABS 0.2 07/26/2014 1229   BASOSABS 0.0 11/18/2018 1039   BASOSABS 0.1 07/26/2014 1229    BMET    Component Value Date/Time   NA 137 02/01/2019 0930   NA 138 07/26/2014 1229   K 4.2 02/01/2019 0930   K 3.6 07/26/2014 1229   CL 97 02/01/2019 0930   CL 100 07/26/2014 1229   CO2 31 02/01/2019 0930   CO2 28 07/26/2014 1229   GLUCOSE 149 (H) 02/01/2019 0930   GLUCOSE 164 (H) 07/26/2014 1229   BUN 10 02/01/2019 0930   BUN 24 (H) 07/26/2014 1229   CREATININE 0.69 02/01/2019 0930   CREATININE 1.07 07/26/2014 1229   CALCIUM 10.1 02/01/2019 0930   CALCIUM 9.5 07/26/2014 1229   GFRNONAA 55 (L) 07/26/2014 1229   GFRAA >60 07/26/2014 1229    BNP No results found for: BNP  ProBNP No results found for: PROBNP  Imaging: Mm 3d Screen Breast Bilateral  Result Date: 05/02/2019 CLINICAL DATA:  Screening. EXAM: DIGITAL SCREENING BILATERAL MAMMOGRAM WITH TOMO AND CAD COMPARISON:  Previous exam(s). ACR Breast Density Category b: There are scattered areas of fibroglandular density. FINDINGS: There are no findings suspicious for malignancy. Images were processed with CAD. IMPRESSION: No mammographic evidence of malignancy. A result letter of this screening mammogram will be mailed directly to the patient. RECOMMENDATION: Screening mammogram in one year. (Code:SM-B-01Y) BI-RADS CATEGORY  1: Negative. Electronically Signed   By: Lillia Mountain M.D.   On: 05/02/2019 14:32      PFT Results Latest Ref Rng & Units 04/28/2018 07/10/2017 06/11/2016 10/15/2015 04/02/2015 03/01/2015 08/24/2014  FVC-Pre L 0.98 1.16 1.30 1.37 1.46 1.43 1.71  FVC-Predicted Pre % 31 34 38 40 42 41 49  FVC-Post L 1.10 1.14 1.26 1.40 1.33 1.30 1.56  FVC-Predicted Post  % 35 33 37 41 38 37 45  Pre FEV1/FVC % % 94 91 90 91 90 90 91  Post FEV1/FCV % % 92 95 92 93 93 96 92  FEV1-Pre L 0.92 1.06 1.17 1.25 1.31 1.29 1.56  FEV1-Predicted Pre % 38 41 45 47 49 48 58  FEV1-Post L 1.02 1.08 1.17 1.30 1.24 1.25 1.44  DLCO UNC% % 36 33 21 39 44 106 40  DLCO COR %Predicted % 96 83 76 97 103 74 83  TLC L - 2.67 2.37 2.34 2.56 - -  TLC % Predicted % - 49 44 43 47 - -  RV % Predicted % - 30 34 34 45 - -    No results found for: NITRICOXIDE      Assessment & Plan:   No problem-specific Assessment & Plan notes found for this encounter.     Rexene Edison, NP 05/05/2019

## 2019-05-05 NOTE — Patient Instructions (Addendum)
Keep taking Esbriet as you are doing Keep taking CellCept as you're doing Continue on Oxygen 4l/m rest and 6-8 l/m walking .  Continue on CPAP At bedtime with Oxygen .   follow up with Dr. Vaughan Browner  in 3-4 months and As needed  (PFT , 6 min walk test and HRCT chest )  Please contact office for sooner follow up if symptoms do not improve or worsen or seek emergency care

## 2019-05-06 DIAGNOSIS — G4733 Obstructive sleep apnea (adult) (pediatric): Secondary | ICD-10-CM | POA: Diagnosis not present

## 2019-05-06 NOTE — Assessment & Plan Note (Signed)
Continue on Oxygen 4l/m rest , 6-8l/m with act

## 2019-05-06 NOTE — Addendum Note (Signed)
Addended by: Parke Poisson E on: 05/06/2019 04:23 PM   Modules accepted: Orders

## 2019-05-06 NOTE — Assessment & Plan Note (Addendum)
Currently stable -no change in activity tolerance or oxygen demands.  Check PFT, 6 min walk test and HRCT chest on return in 4 months   Plan  Patient Instructions   Keep taking Esbriet as you are doing Keep taking CellCept as you're doing Continue on Oxygen 4l/m rest and 6-8 l/m walking .  Continue on CPAP At bedtime with Oxygen .   follow up with Dr. Vaughan Browner  in 3-4 months and As needed  (PFT , 6 min walk test and HRCT chest )  Please contact office for sooner follow up if symptoms do not improve or worsen or seek emergency care

## 2019-05-06 NOTE — Assessment & Plan Note (Signed)
Continue on CPAP At bedtime   

## 2019-05-12 ENCOUNTER — Other Ambulatory Visit: Payer: Self-pay | Admitting: Pulmonary Disease

## 2019-05-12 ENCOUNTER — Other Ambulatory Visit: Payer: Self-pay | Admitting: Internal Medicine

## 2019-05-12 NOTE — Addendum Note (Signed)
Addended by: Parke Poisson E on: 05/12/2019 03:04 PM   Modules accepted: Orders

## 2019-05-16 NOTE — Addendum Note (Signed)
Addended by: Parke Poisson E on: 05/16/2019 10:08 AM   Modules accepted: Orders

## 2019-05-18 NOTE — Addendum Note (Signed)
Addended by: Parke Poisson E on: 05/18/2019 12:01 PM   Modules accepted: Orders

## 2019-05-20 ENCOUNTER — Other Ambulatory Visit: Payer: Self-pay

## 2019-05-20 ENCOUNTER — Encounter: Payer: Self-pay | Admitting: Family Medicine

## 2019-05-20 ENCOUNTER — Ambulatory Visit (INDEPENDENT_AMBULATORY_CARE_PROVIDER_SITE_OTHER): Payer: Medicare Other | Admitting: Family Medicine

## 2019-05-20 VITALS — BP 130/64 | HR 78 | Temp 98.3°F | Ht 67.0 in | Wt 233.5 lb

## 2019-05-20 DIAGNOSIS — M25512 Pain in left shoulder: Secondary | ICD-10-CM

## 2019-05-20 MED ORDER — DICLOFENAC SODIUM 75 MG PO TBEC: 75.0000 mg | DELAYED_RELEASE_TABLET | Freq: Two times a day (BID) | ORAL | 0 refills | Status: DC

## 2019-05-20 NOTE — Patient Instructions (Signed)
Complete 2 week course of diclofenac.  Start home PT.  Ice or heat.  Cal if pain worsening or not improving as expected.

## 2019-05-20 NOTE — Assessment & Plan Note (Signed)
Most likely rotator cuff tendonitis. Start with course of diclofenac, home PT and ice.  No red flags or clear indication for X-ray.

## 2019-05-20 NOTE — Progress Notes (Signed)
Chief Complaint  Patient presents with  . Shoulder Pain    Left-Started Monday    History of Present Illness: HPI    68 year old female with new onset  Left shoulder pain. In last week sharp pinching pain from neck to upper shoulder. Shooting pain with movement.  Pain increases with int rotaiton and using left arm.  Some pain with neck movement.   No numbness, no weakness. No radiation to hand.  [place] worsening.  She has been taking  Ibuprofen 600-800... helped some with pain.   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 repetitively.  No falls.   COVID 19 screen No recent travel or known exposure to COVID19 The patient denies respiratory symptoms of COVID 19 at this time.  The importance of social distancing was discussed today.   Review of Systems  Constitutional: Negative for chills and fever.  HENT: Negative for congestion and ear pain.   Eyes: Negative for pain and redness.  Respiratory: Negative for cough and shortness of breath.   Cardiovascular: Negative for chest pain, palpitations and leg swelling.  Gastrointestinal: Negative for abdominal pain, blood in stool, constipation, diarrhea, nausea and vomiting.  Genitourinary: Negative for dysuria.  Musculoskeletal: Negative for falls and myalgias.  Skin: Negative for rash.  Neurological: Negative for dizziness.  Psychiatric/Behavioral: Negative for depression. The patient is not nervous/anxious.       Past Medical History:  Diagnosis Date  . Allergic rhinitis    uses Flonase daily  . Anesthesia complication    Per pt/ past endoscopy in 2015, the anesthetic spray in back throat caused her to have breathing problems  . Anxiety   . Bronchitis   . Chicken pox   . Constipation    takes Fiber daily  . Depression   . Diverticulosis   . Dysphagia   . GERD (gastroesophageal reflux disease)    takes Protonix daily  . H/O hiatal hernia   . Hemorrhoids   . History of  bronchitis    2013  . History of colon polyps   . History of kidney stones   . Hyperlipidemia    lost 43 pounds and no meds required at present  . Hypertension    takes Verapamil and HCTZ daily  . Hypokalemia   . Hypothyroidism (acquired)    takes Synthroid daily  . Insomnia    doesn't take any meds for this  . Joint swelling    left thumb  . Migraines    last one about a month ago  . Non-insulin dependent type 2 diabetes mellitus ([place])    takes Glipizide daily  . OA (osteoarthritis)    left knee  . Oxygen deficiency   . Oxygen dependent    Pt is on continuous O2 at 3 liters!  . Pneumonia    last time in 2006  . PONV (postoperative nausea and vomiting)   . Shortness of breath    with exertion;takes Singulair daily as well as Flonase  . Urinary frequency     reports that she has never smoked. She has never used smokeless tobacco. She reports that she does not drink alcohol or use drugs.   Current Outpatient Medications:  .  acetaminophen (TYLENOL) 500 MG tablet, Take 1,000 mg by mouth every 6 (six) hours as needed for moderate pain., Disp: , Rfl:  .  canagliflozin (INVOKANA) 100 MG TABS tablet, Take 1 tablet (100 mg total) by mouth daily before breakfast., Disp: 90 tablet,  Rfl: 3 .  FIBER PO, Take 1 tablet by mouth daily., Disp: , Rfl:  .  fluticasone (FLONASE) 50 MCG/ACT nasal spray, Place 1 spray into both nostrils daily., Disp: , Rfl:  .  hydrochlorothiazide (HYDRODIURIL) 12.5 MG tablet, TAKE 1 TABLET DAILY ALONG WITH ONE OF THE LOSARTAN 50 MG TABLETS, Disp: 90 tablet, Rfl: 3 .  levothyroxine (SYNTHROID) 125 MCG tablet, TAKE 1 TABLET BY MOUTH  DAILY BEFORE BREAKFAST, Disp: 90 tablet, Rfl: 3 .  losartan (COZAAR) 50 MG tablet, TAKE 1 TABLET DAILY ALONG WITH 1 OF THE HCTZ 12.5MG  TABLETS, Disp: 90 tablet, Rfl: 3 .  losartan-hydrochlorothiazide (HYZAAR) 50-12.5 MG tablet, Take 1 tablet by mouth daily., Disp: 90 tablet, Rfl: 1 .  metFORMIN (GLUCOPHAGE-XR) 500 MG 24 hr tablet,  Take 3 tablets (1,500 mg total) by mouth daily with breakfast., Disp: 270 tablet, Rfl: 3 .  montelukast (SINGULAIR) 10 MG tablet, Take 1 tablet (10 mg total) by mouth at bedtime., Disp: 90 tablet, Rfl: 3 .  mycophenolate (CELLCEPT) 500 MG tablet, TAKE 2 TABLETS BY MOUTH  TWICE DAILY, Disp: 360 tablet, Rfl: 0 .  NON FORMULARY, Place 4 L into the nose daily. 10 Liters with exertion, Disp: , Rfl:  .  nystatin cream (MYCOSTATIN), Apply 1 application topically 3 (three) times daily., Disp: 30 g, Rfl: 0 .  ONETOUCH ULTRA test strip, CHECK BLOOD SUGAR DAILY., Disp: 100 strip, Rfl: 0 .  pantoprazole (PROTONIX) 40 MG tablet, TAKE 1 TABLET BY MOUTH TWO  TIMES DAILY, Disp: 180 tablet, Rfl: 2 .  pioglitazone (ACTOS) 45 MG tablet, Take 1 tablet (45 mg total) by mouth daily., Disp: 90 tablet, Rfl: 3 .  Pirfenidone (ESBRIET) 801 MG TABS, Take 801 mg by mouth 3 (three) times daily., Disp: 180 tablet, Rfl: 6 .  pravastatin (PRAVACHOL) 40 MG tablet, Take 1qd, Disp: 90 tablet, Rfl: 3 .  verapamil (CALAN-SR) 180 MG CR tablet, TAKE 1 TABLET BY MOUTH  DAILY, Disp: 90 tablet, Rfl: 2   Observations/Objective: Blood pressure 130/64, pulse 78, temperature 98.3 F (36.8 C), temperature source Temporal, height 5\' 7"  (1.702 m), weight 233 lb 8 oz (105.9 kg), SpO2 98 %.  Physical Exam Constitutional:      General: She is not in acute distress.    Appearance: Normal appearance. She is well-developed. She is not ill-appearing or toxic-appearing.  HENT:     Head: Normocephalic.     Right Ear: Hearing, tympanic membrane, ear canal and external ear normal. Tympanic membrane is not erythematous, retracted or bulging.     Left Ear: Hearing, tympanic membrane, ear canal and external ear normal. Tympanic membrane is not erythematous, retracted or bulging.     Nose: No mucosal edema or rhinorrhea.     Right Sinus: No maxillary sinus tenderness or frontal sinus tenderness.     Left Sinus: No maxillary sinus tenderness or frontal  sinus tenderness.     Mouth/Throat:     Pharynx: Uvula midline.  Eyes:     General: Lids are normal. Lids are everted, no foreign bodies appreciated.     Conjunctiva/sclera: Conjunctivae normal.     Pupils: Pupils are equal, round, and reactive to light.  Neck:     Musculoskeletal: Normal range of motion and neck supple.     Thyroid: No thyroid mass or thyromegaly.     Vascular: No carotid bruit.     Trachea: Trachea normal.  Cardiovascular:     Rate and Rhythm: Normal rate and regular rhythm.  Pulses: Normal pulses.     Heart sounds: Normal heart sounds, S1 normal and S2 normal. No murmur. No friction rub. No gallop.   Pulmonary:     Effort: Pulmonary effort is normal. No tachypnea or respiratory distress.     Breath sounds: Decreased breath sounds and rhonchi present. No wheezing or rales.  Abdominal:     General: Bowel sounds are normal.     Palpations: Abdomen is soft.     Tenderness: There is no abdominal tenderness.  Musculoskeletal:     Left shoulder: She exhibits decreased range of motion, tenderness and bony tenderness.     Cervical back: She exhibits tenderness. She exhibits normal range of motion and no bony tenderness.     Comments: Positive neers, neg drop arm   mildly positive spurlings on left  Skin:    General: Skin is warm and dry.     Findings: No rash.  Neurological:     Mental Status: She is alert.  Psychiatric:        Mood and Affect: Mood is not anxious or depressed.        Speech: Speech normal.        Behavior: Behavior normal. Behavior is cooperative.        Thought Content: Thought content normal.        Judgment: Judgment normal.      Assessment and Plan   Acute pain of left shoulder Most likely rotator cuff tendonitis. Start with course of diclofenac, home PT and ice.  No red flags or clear indication for X-ray.     [name], MD

## 2019-06-01 ENCOUNTER — Other Ambulatory Visit: Payer: Self-pay | Admitting: Pulmonary Disease

## 2019-06-05 DIAGNOSIS — J84111 Idiopathic interstitial pneumonia, not otherwise specified: Secondary | ICD-10-CM | POA: Diagnosis not present

## 2019-06-05 DIAGNOSIS — J8489 Other specified interstitial pulmonary diseases: Secondary | ICD-10-CM | POA: Diagnosis not present

## 2019-06-05 DIAGNOSIS — G4733 Obstructive sleep apnea (adult) (pediatric): Secondary | ICD-10-CM | POA: Diagnosis not present

## 2019-06-09 ENCOUNTER — Other Ambulatory Visit: Payer: Self-pay | Admitting: Family Medicine

## 2019-06-22 ENCOUNTER — Telehealth: Payer: Self-pay | Admitting: Adult Health

## 2019-06-22 MED ORDER — MYCOPHENOLATE MOFETIL 500 MG PO TABS: 1000.0000 mg | ORAL_TABLET | Freq: Two times a day (BID) | ORAL | 0 refills | Status: AC

## 2019-06-22 NOTE — Telephone Encounter (Signed)
Call returned to [place] (dpr), confirmed pt DOB, verified medication and dosage. Refill sent. Voiced understanding.   Nothing further needed at this time.

## 2019-06-30 ENCOUNTER — Telehealth: Payer: Self-pay | Admitting: Internal Medicine

## 2019-06-30 DIAGNOSIS — Z5181 Encounter for therapeutic drug level monitoring: Secondary | ICD-10-CM

## 2019-06-30 NOTE — Telephone Encounter (Signed)
Spoke with the pt's daughter  She states pt is getting labs done at her PCP, Dr [name] on 07/12/19 and she wants to know if [facility] wants to order anything for her to have drawn there  She is on Esbriet  Next ov here 07/27/2019  Please advise thanks!

## 2019-06-30 NOTE — Telephone Encounter (Signed)
Lab orders placed  Pt already scheduled for 6MW and rov here so will keep that appt for now and discuss further f/u in [place] later

## 2019-06-30 NOTE — Telephone Encounter (Signed)
I noticed that she is having upcoming CT scan of the chest and PFT and then follow-up with me.  This is great.  It will be great if she can have a liver function test and CBC and chemistry because the last one was done in July 2020.  From our perspective the most important thing is a liver function test.  Also she lives in [place], [place].  If she wants she can come in visit with me at the [facility] office for 30 minutes

## 2019-07-05 DIAGNOSIS — J8489 Other specified interstitial pulmonary diseases: Secondary | ICD-10-CM | POA: Diagnosis not present

## 2019-07-05 DIAGNOSIS — G4733 Obstructive sleep apnea (adult) (pediatric): Secondary | ICD-10-CM | POA: Diagnosis not present

## 2019-07-05 DIAGNOSIS — J84111 Idiopathic interstitial pneumonia, not otherwise specified: Secondary | ICD-10-CM | POA: Diagnosis not present

## 2019-07-11 ENCOUNTER — Telehealth: Payer: Self-pay | Admitting: Family Medicine

## 2019-07-11 DIAGNOSIS — E039 Hypothyroidism, unspecified: Secondary | ICD-10-CM

## 2019-07-11 DIAGNOSIS — E1165 Type 2 diabetes mellitus with hyperglycemia: Secondary | ICD-10-CM

## 2019-07-11 DIAGNOSIS — E1169 Type 2 diabetes mellitus with other specified complication: Secondary | ICD-10-CM

## 2019-07-11 NOTE — Telephone Encounter (Signed)
-----   Message from Ellamae Sia sent at 07/06/2019 10:10 AM EST ----- Regarding: lab orders for Tuesday, 12.22.20  AWV lab orders, please.

## 2019-07-12 ENCOUNTER — Ambulatory Visit (INDEPENDENT_AMBULATORY_CARE_PROVIDER_SITE_OTHER): Payer: Medicare Other

## 2019-07-12 ENCOUNTER — Ambulatory Visit: Payer: Medicare Other

## 2019-07-12 ENCOUNTER — Other Ambulatory Visit (INDEPENDENT_AMBULATORY_CARE_PROVIDER_SITE_OTHER): Payer: Medicare Other

## 2019-07-12 DIAGNOSIS — E039 Hypothyroidism, unspecified: Secondary | ICD-10-CM

## 2019-07-12 DIAGNOSIS — Z0000 Encounter for general adult medical examination without abnormal findings: Secondary | ICD-10-CM | POA: Diagnosis not present

## 2019-07-12 DIAGNOSIS — E1165 Type 2 diabetes mellitus with hyperglycemia: Secondary | ICD-10-CM | POA: Diagnosis not present

## 2019-07-12 DIAGNOSIS — Z5181 Encounter for therapeutic drug level monitoring: Secondary | ICD-10-CM

## 2019-07-12 LAB — T3, FREE: T3, Free: 2.5 pg/mL (ref 2.3–4.2)

## 2019-07-12 LAB — COMPREHENSIVE METABOLIC PANEL
ALT: 8 U/L (ref 0–35)
AST: 13 U/L (ref 0–37)
Albumin: 4.6 g/dL (ref 3.5–5.2)
Alkaline Phosphatase: 71 U/L (ref 39–117)
BUN: 14 mg/dL (ref 6–23)
CO2: 30 mEq/L (ref 19–32)
Calcium: 10.3 mg/dL (ref 8.4–10.5)
Chloride: 96 mEq/L (ref 96–112)
Creatinine, Ser: 0.77 mg/dL (ref 0.40–1.20)
GFR: 74.41 mL/min (ref >60.00)
Glucose, Bld: 160 mg/dL — ABNORMAL HIGH (ref 70–99)
Potassium: 4.2 mEq/L (ref 3.5–5.1)
Sodium: 136 mEq/L (ref 135–145)
Total Bilirubin: 0.4 mg/dL (ref 0.2–1.2)
Total Protein: 7.7 g/dL (ref 6.0–8.3)

## 2019-07-12 LAB — CBC WITH DIFFERENTIAL/PLATELET
Basophils Absolute: 0.1 10*3/uL (ref 0.0–0.1)
Basophils Relative: 0.5 % (ref 0.0–3.0)
Eosinophils Absolute: 0.1 10*3/uL (ref 0.0–0.7)
Eosinophils Relative: 1.2 % (ref 0.0–5.0)
HCT: 43 % (ref 36.0–46.0)
Hemoglobin: 13.7 g/dL (ref 12.0–15.0)
Lymphocytes Relative: 14 % (ref 12.0–46.0)
Lymphs Abs: 1.5 10*3/uL (ref 0.7–4.0)
MCHC: 31.9 g/dL (ref 30.0–36.0)
MCV: 93.9 fl (ref 78.0–100.0)
Monocytes Absolute: 0.7 10*3/uL (ref 0.1–1.0)
Monocytes Relative: 6.3 % (ref 3.0–12.0)
Neutro Abs: 8.5 10*3/uL — ABNORMAL HIGH (ref 1.4–7.7)
Neutrophils Relative %: 78 % — ABNORMAL HIGH (ref 43.0–77.0)
Platelets: 256 10*3/uL (ref 150.0–400.0)
RBC: 4.58 Mil/uL (ref 3.87–5.11)
RDW: 15.2 % (ref 11.5–15.5)
WBC: 10.8 10*3/uL — ABNORMAL HIGH (ref 4.0–10.5)

## 2019-07-12 LAB — LIPID PANEL
Cholesterol: 234 mg/dL — ABNORMAL HIGH (ref 0–200)
HDL: 75.3 mg/dL (ref >39.00)
LDL Cholesterol: 134 mg/dL — ABNORMAL HIGH (ref 0–99)
NonHDL: 158.48
Total CHOL/HDL Ratio: 3
Triglycerides: 124 mg/dL (ref 0.0–149.0)
VLDL: 24.8 mg/dL (ref 0.0–40.0)

## 2019-07-12 LAB — HEMOGLOBIN A1C: Hgb A1c MFr Bld: 6.7 % — ABNORMAL HIGH (ref 4.6–6.5)

## 2019-07-12 LAB — T4, FREE: Free T4: 1.21 ng/dL (ref 0.60–1.60)

## 2019-07-12 LAB — TSH: TSH: 1.04 u[IU]/mL (ref 0.35–4.50)

## 2019-07-12 NOTE — Progress Notes (Signed)
PCP notes:  Health Maintenance: Declined colonoscopy   Abnormal Screenings: none   Patient concerns: Ongoing neck pain, wants to discuss having xray done   Nurse concerns: none   Next PCP appt.: 07/26/2019 @ 10 am

## 2019-07-12 NOTE — Patient Instructions (Signed)
Kaitlin Hamilton , Thank you for taking time to come for your Medicare Wellness Visit. I appreciate your ongoing commitment to your health goals. Please review the following plan we discussed and let me know if I can assist you in the future.   Screening recommendations/referrals: Colonoscopy: declined Mammogram: Up to date, completed 05/02/2019 Bone Density: Up to date, completed 06/28/2014 Recommended yearly ophthalmology/optometry visit for glaucoma screening and checkup Recommended yearly dental visit for hygiene and checkup  Vaccinations: Influenza vaccine: Up to date, completed 03/16/2019 Pneumococcal vaccine: Completed series Tdap vaccine: Up to date, completed 07/21/2009 Shingles vaccine: not a candidate for Zostavax    Advanced directives: Advance directive discussed with you today. Even though you declined this today please call our office should you change your mind and we can give you the proper paperwork for you to fill out.  Conditions/risks identified: diabetes, hypertension  Next appointment: 07/26/2019 @ 10 am    Preventive Care 65 Years and Older, Female Preventive care refers to lifestyle choices and visits with your health care provider that can promote health and wellness. What does preventive care include?  A yearly physical exam. This is also called an annual well check.  Dental exams once or twice a year.  Routine eye exams. Ask your health care provider how often you should have your eyes checked.  Personal lifestyle choices, including:  Daily care of your teeth and gums.  Regular physical activity.  Eating a healthy diet.  Avoiding tobacco and drug use.  Limiting alcohol use.  Practicing safe sex.  Taking low-dose aspirin every day.  Taking vitamin and mineral supplements as recommended by your health care provider. What happens during an annual well check? The services and screenings done by your health care provider during your annual well check will  depend on your age, overall health, lifestyle risk factors, and family history of disease. Counseling  Your health care provider may ask you questions about your:  Alcohol use.  Tobacco use.  Drug use.  Emotional well-being.  Home and relationship well-being.  Sexual activity.  Eating habits.  History of falls.  Memory and ability to understand (cognition).  Work and work Statistician.  Reproductive health. Screening  You may have the following tests or measurements:  Height, weight, and BMI.  Blood pressure.  Lipid and cholesterol levels. These may be checked every 5 years, or more frequently if you are over 54 years old.  Skin check.  Lung cancer screening. You may have this screening every year starting at age 80 if you have a 30-pack-year history of smoking and currently smoke or have quit within the past 15 years.  Fecal occult blood test (FOBT) of the stool. You may have this test every year starting at age 11.  Flexible sigmoidoscopy or colonoscopy. You may have a sigmoidoscopy every 5 years or a colonoscopy every 10 years starting at age 74.  Hepatitis C blood test.  Hepatitis B blood test.  Sexually transmitted disease (STD) testing.  Diabetes screening. This is done by checking your blood sugar (glucose) after you have not eaten for a while (fasting). You may have this done every 1-3 years.  Bone density scan. This is done to screen for osteoporosis. You may have this done starting at age 86.  Mammogram. This may be done every 1-2 years. Talk to your health care provider about how often you should have regular mammograms. Talk with your health care provider about your test results, treatment options, and if necessary, the  need for more tests. Vaccines  Your health care provider may recommend certain vaccines, such as:  Influenza vaccine. This is recommended every year.  Tetanus, diphtheria, and acellular pertussis (Tdap, Td) vaccine. You may need a  Td booster every 10 years.  Zoster vaccine. You may need this after age 41.  Pneumococcal 13-valent conjugate (PCV13) vaccine. One dose is recommended after age 62.  Pneumococcal polysaccharide (PPSV23) vaccine. One dose is recommended after age 29. Talk to your health care provider about which screenings and vaccines you need and how often you need them. This information is not intended to replace advice given to you by your health care provider. Make sure you discuss any questions you have with your health care provider. Document Released: 08/03/2015 Document Revised: 03/26/2016 Document Reviewed: 05/08/2015 Elsevier Interactive Patient Education  2017 [employer] Prevention in the Home Falls can cause injuries. They can happen to people of all ages. There are many things you can do to make your home safe and to help prevent falls. What can I do on the outside of my home?  Regularly fix the edges of walkways and driveways and fix any cracks.  Remove anything that might make you trip as you walk through a door, such as a raised step or threshold.  Trim any bushes or trees on the path to your home.  Use bright outdoor lighting.  Clear any walking paths of anything that might make someone trip, such as rocks or tools.  Regularly check to see if handrails are loose or broken. Make sure that both sides of any steps have handrails.  Any raised decks and porches should have guardrails on the edges.  Have any leaves, snow, or ice cleared regularly.  Use sand or salt on walking paths during winter.  Clean up any spills in your garage right away. This includes oil or grease spills. What can I do in the bathroom?  Use night lights.  Install grab bars by the toilet and in the tub and shower. Do not use towel bars as grab bars.  Use non-skid mats or decals in the tub or shower.  If you need to sit down in the shower, use a plastic, non-slip stool.  Keep the floor dry. Clean  up any water that spills on the floor as soon as it happens.  Remove soap buildup in the tub or shower regularly.  Attach bath mats securely with double-sided non-slip rug tape.  Do not have throw rugs and other things on the floor that can make you trip. What can I do in the bedroom?  Use night lights.  Make sure that you have a light by your bed that is easy to reach.  Do not use any sheets or blankets that are too big for your bed. They should not hang down onto the floor.  Have a firm chair that has side arms. You can use this for support while you get dressed.  Do not have throw rugs and other things on the floor that can make you trip. What can I do in the kitchen?  Clean up any spills right away.  Avoid walking on wet floors.  Keep items that you use a lot in easy-to-reach places.  If you need to reach something above you, use a strong step stool that has a grab bar.  Keep electrical cords out of the way.  Do not use floor polish or wax that makes floors slippery. If you must use wax,  use non-skid floor wax.  Do not have throw rugs and other things on the floor that can make you trip. What can I do with my stairs?  Do not leave any items on the stairs.  Make sure that there are handrails on both sides of the stairs and use them. Fix handrails that are broken or loose. Make sure that handrails are as long as the stairways.  Check any carpeting to make sure that it is firmly attached to the stairs. Fix any carpet that is loose or worn.  Avoid having throw rugs at the top or bottom of the stairs. If you do have throw rugs, attach them to the floor with carpet tape.  Make sure that you have a light switch at the top of the stairs and the bottom of the stairs. If you do not have them, ask someone to add them for you. What else can I do to help prevent falls?  Wear shoes that:  Do not have high heels.  Have rubber bottoms.  Are comfortable and fit you well.  Are  closed at the toe. Do not wear sandals.  If you use a stepladder:  Make sure that it is fully opened. Do not climb a closed stepladder.  Make sure that both sides of the stepladder are locked into place.  Ask someone to hold it for you, if possible.  Clearly mark and make sure that you can see:  Any grab bars or handrails.  First and last steps.  Where the edge of each step is.  Use tools that help you move around (mobility aids) if they are needed. These include:  Canes.  Walkers.  Scooters.  Crutches.  Turn on the lights when you go into a dark area. Replace any light bulbs as soon as they burn out.  Set up your furniture so you have a clear path. Avoid moving your furniture around.  If any of your floors are uneven, fix them.  If there are any pets around you, be aware of where they are.  Review your medicines with your doctor. Some medicines can make you feel dizzy. This can increase your chance of falling. Ask your doctor what other things that you can do to help prevent falls. This information is not intended to replace advice given to you by your health care provider. Make sure you discuss any questions you have with your health care provider. Document Released: 05/03/2009 Document Revised: 12/13/2015 Document Reviewed: 08/11/2014 Elsevier Interactive Patient Education  2017 [employer].

## 2019-07-12 NOTE — Progress Notes (Signed)
Subjective:   Kaitlin Hamilton is a 68 y.o. female who presents for Medicare Annual (Subsequent) preventive examination.  Review of Systems: N/A   This visit is being conducted through telemedicine via telephone at the nurse health advisor's home address due to the COVID-19 pandemic. This patient has given me verbal consent via doximity to conduct this visit, patient states they are participating from their home address. Patient and myself are on the telephone call. There is no referral for this visit. Some vital signs may be absent or patient reported.    Patient identification: identified by name, DOB, and current address   Cardiac Risk Factors include: advanced age (>28men, >56 women);diabetes mellitus;hypertension     Objective:     Vitals: There were no vitals taken for this visit.  There is no height or weight on file to calculate BMI.  Advanced Directives 07/12/2019 07/08/2018 06/02/2017 01/29/2016 04/27/2014 08/09/2013 08/08/2013  Does Patient Have a Medical Advance Directive? No No No No No Patient does not have advance directive;Patient would not like information Patient does not have advance directive;Patient would not like information  Would patient like information on creating a medical advance directive? No - Patient declined No - Patient declined - Yes - Educational materials given No - patient declined information - -  Pre-existing out of facility DNR order (yellow form or pink MOST form) - - - - - No -    Tobacco Social History   Tobacco Use  Smoking Status Never Smoker  Smokeless Tobacco Never Used     Counseling given: Not Answered   Clinical Intake:  Pre-visit preparation completed: Yes  Pain : 0-10 Pain Score: 4  Pain Type: Chronic pain Pain Location: Neck Pain Descriptors / Indicators: Aching Pain Onset: More than a month ago Pain Frequency: Intermittent Pain Relieving Factors: tylenol, pain relieving cream  Pain Relieving Factors: tylenol, pain  relieving cream  Nutritional Risks: None Diabetes: Yes CBG done?: No Did pt. bring in CBG monitor from home?: No  How often do you need to have someone help you when you read instructions, pamphlets, or other written materials from your doctor or pharmacy?: 1 - Never What is the last grade level you completed in school?: GED  Interpreter Needed?: No  Information entered by :: CJohnson, LPN  Past Medical History:  Diagnosis Date  . Allergic rhinitis    uses Flonase daily  . Anesthesia complication    Per pt/ past endoscopy in 2015, the anesthetic spray in back throat caused her to have breathing problems  . Anxiety   . Bronchitis   . Chicken pox   . Constipation    takes Fiber daily  . Depression   . Diverticulosis   . Dysphagia   . GERD (gastroesophageal reflux disease)    takes Protonix daily  . H/O hiatal hernia   . Hemorrhoids   . History of bronchitis    2013  . History of colon polyps   . History of kidney stones   . Hyperlipidemia    lost 43 pounds and no meds required at present  . Hypertension    takes Verapamil and HCTZ daily  . Hypokalemia   . Hypothyroidism (acquired)    takes Synthroid daily  . Insomnia    doesn't take any meds for this  . Joint swelling    left thumb  . Migraines    last one about a month ago  . Non-insulin dependent type 2 diabetes mellitus ([place])  takes Glipizide daily  . OA (osteoarthritis)    left knee  . Oxygen deficiency   . Oxygen dependent    Pt is on continuous O2 at 3 liters!  . Pneumonia    last time in 2006  . PONV (postoperative nausea and vomiting)   . Shortness of breath    with exertion;takes Singulair daily as well as Flonase  . Urinary frequency    Past Surgical History:  Procedure Laterality Date  . ABDOMINAL EXPLORATION SURGERY     For Ovarian Cyst   . APPENDECTOMY    . CHOLECYSTECTOMY    . COLONOSCOPY  05/2013   TA, severe diverticulosis, rpt 3 yrs ([facility])  . ENTEROSCOPY N/A 08/12/2013    Procedure: ENTEROSCOPY;  Surgeon: Lafayette Dragon, MD;  Location: [facility];  Service: Endoscopy;  Laterality: N/A;  . ESOPHAGOGASTRODUODENOSCOPY    . left knee arthroscopy    . LITHOTRIPSY     x 2  . LUNG BIOPSY Right 06/29/2013   Procedure: LUNG BIOPSY;  Surgeon: Melrose Nakayama, MD;  Location: [facility];  Service: Thoracic;  Laterality: Right;  . TCS    . TUBAL LIGATION    . VIDEO ASSISTED THORACOSCOPY Right 06/29/2013   Procedure: VIDEO ASSISTED THORACOSCOPY;  Surgeon: Melrose Nakayama, MD;  Location: [facility];  Service: Thoracic;  Laterality: Right;   Family History  Problem Relation Age of Onset  . Rheum arthritis Mother   . Rheum arthritis Sister   . Prostate cancer Brother   . Heart disease Brother   . Heart disease Maternal Grandmother   . Heart disease Maternal Grandfather   . Uterine cancer Daughter   . Colon cancer Neg Hx   . Breast cancer Neg Hx    Social History   Socioeconomic History  . Marital status: Widowed    Spouse name: Not on file  . Number of children: 2  . Years of education: Not on file  . Highest education level: Not on file  Occupational History  . Occupation: Day Care at [employer]  Tobacco Use  . Smoking status: Never Smoker  . Smokeless tobacco: Never Used  Substance and Sexual Activity  . Alcohol use: No    Alcohol/week: 0.0 standard drinks  . Drug use: No  . Sexual activity: Never    Birth control/protection: Post-menopausal  Other Topics Concern  . Not on file  Social History Narrative   Daily caffeine     Exercise: none recently    Diet: poor   Social Determinants of Health   Financial Resource Strain:   . Difficulty of Paying Living Expenses: Not on file  Food Insecurity: No Food Insecurity  . Worried About Charity fundraiser in the Last Year: Never true  . Ran Out of Food in the Last Year: Never true  Transportation Needs: No Transportation Needs  . Lack of Transportation (Medical): No  . Lack of Transportation  (Non-Medical): No  Physical Activity: Inactive  . Days of Exercise per Week: 0 days  . Minutes of Exercise per Session: 0 min  Stress: No Stress Concern Present  . Feeling of Stress : Not at all  Social Connections:   . Frequency of Communication with Friends and Family: Not on file  . Frequency of Social Gatherings with Friends and Family: Not on file  . Attends Religious Services: Not on file  . Active Member of Clubs or Organizations: Not on file  . Attends Archivist Meetings: Not on file  .  Marital Status: Not on file    Outpatient Encounter Medications as of 07/12/2019  Medication Sig  . acetaminophen (TYLENOL) 500 MG tablet Take 1,000 mg by mouth every 6 (six) hours as needed for moderate pain.  . canagliflozin (INVOKANA) 100 MG TABS tablet Take 1 tablet (100 mg total) by mouth daily before breakfast.  . diclofenac (VOLTAREN) 75 MG EC tablet Take 1 tablet (75 mg total) by mouth 2 (two) times daily.  Marland Kitchen FIBER PO Take 1 tablet by mouth daily.  . fluticasone (FLONASE) 50 MCG/ACT nasal spray Place 1 spray into both nostrils daily.  . hydrochlorothiazide (HYDRODIURIL) 12.5 MG tablet TAKE 1 TABLET DAILY ALONG WITH ONE OF THE LOSARTAN 50 MG TABLETS  . levothyroxine (SYNTHROID) 125 MCG tablet TAKE 1 TABLET BY MOUTH  DAILY BEFORE BREAKFAST  . losartan (COZAAR) 50 MG tablet TAKE 1 TABLET DAILY ALONG WITH 1 OF THE HCTZ 12.5MG  TABLETS  . losartan-hydrochlorothiazide (HYZAAR) 50-12.5 MG tablet Take 1 tablet by mouth daily.  . metFORMIN (GLUCOPHAGE-XR) 500 MG 24 hr tablet Take 3 tablets (1,500 mg total) by mouth daily with breakfast.  . montelukast (SINGULAIR) 10 MG tablet Take 1 tablet (10 mg total) by mouth at bedtime.  . mycophenolate (CELLCEPT) 500 MG tablet Take 2 tablets (1,000 mg total) by mouth 2 (two) times daily.  . NON FORMULARY Place 4 L into the nose daily. 10 Liters with exertion  . nystatin cream (MYCOSTATIN) Apply 1 application topically 3 (three) times daily.  [name] ULTRA test strip CHECK BLOOD SUGAR DAILY.  . pantoprazole (PROTONIX) 40 MG tablet TAKE 1 TABLET BY MOUTH TWO  TIMES DAILY  . pioglitazone (ACTOS) 45 MG tablet Take 1 tablet (45 mg total) by mouth daily.  . Pirfenidone (ESBRIET) 801 MG TABS Take 801 mg by mouth 3 (three) times daily.  . pravastatin (PRAVACHOL) 40 MG tablet Take 1qd  . verapamil (CALAN-SR) 180 MG CR tablet TAKE 1 TABLET BY MOUTH  DAILY   No facility-administered encounter medications on file as of 07/12/2019.    Activities of Daily Living In your present state of health, do you have any difficulty performing the following activities: 07/12/2019  Hearing? N  Vision? N  Difficulty concentrating or making decisions? N  Walking or climbing stairs? N  Dressing or bathing? N  Doing errands, shopping? N  Preparing Food and eating ? N  Using the Toilet? N  In the past six months, have you accidently leaked urine? N  Do you have problems with loss of bowel control? N  Managing your Medications? N  Managing your Finances? N  Housekeeping or managing your Housekeeping? N  Some recent data might be hidden    Patient Care Team: [name], MD as PCP - General (Family Medicine) Juanito Doom, MD as Consulting Physician (Pulmonary Disease)    Assessment:   This is a routine wellness examination for Kaitlin Hamilton.  Exercise Activities and Dietary recommendations Current Exercise Habits: The patient does not participate in regular exercise at present, Exercise limited by: None identified  Goals    . Increase water intake     Starting 07/08/2018, I will continue to drink at least 6-8 glasses of water daily.     . Patient Stated     07/12/2019, I will continue to work on losing more weight.         Fall Risk Fall Risk  07/12/2019 02/04/2019 07/08/2018 06/02/2017 01/30/2017  Falls in the past year? 0 0 0 No No  Number falls in past yr: 0 - - - -  Injury with Fall? 0 - - - -  Risk for fall due to : Medication  side effect - - - -  Follow up Falls evaluation completed;Falls prevention discussed Falls evaluation completed - - -   Is the patient's home free of loose throw rugs in walkways, pet beds, electrical cords, etc?   yes      Grab bars in the bathroom? yes      Handrails on the stairs?   yes      Adequate lighting?   yes  Timed Get Up and Go performed: N/A  Depression Screen PHQ 2/9 Scores 07/12/2019 02/04/2019 07/08/2018 06/02/2017  PHQ - 2 Score 1 0 0 0  PHQ- 9 Score 1 - 0 0  Exception Documentation - Other- indicate reason in comment box - -  Not completed - PHQ-2 negative - -     Cognitive Function MMSE - Mini Mental State Exam 07/12/2019 07/08/2018 06/02/2017  Orientation to time 5 5 5   Orientation to Place 5 5 5   Registration 3 3 3   Attention/ Calculation 5 0 0  Recall 3 3 3   Language- name 2 objects - 0 0  Language- repeat 1 1 1   Language- follow 3 step command - 3 3  Language- read & follow direction - 0 0  Write a sentence - 0 0  Copy design - 0 0  Total score - 20 20  Mini Cog  Mini-Cog screen was completed. Maximum score is 22. A value of 0 denotes this part of the MMSE was not completed or the patient failed this part of the Mini-Cog screening.       Immunization History  Administered Date(s) Administered  . Influenza Split 05/02/2005, 11/28/2014  . Influenza Whole 04/20/2012  . Influenza, High Dose Seasonal PF 04/16/2018  . Influenza,inj,Quad PF,6+ Mos 05/17/2013, 04/04/2014, 10/06/2014, 05/11/2015, 10/19/2015, 03/12/2016, 04/30/2017, 10/14/2017, 03/16/2019  . Influenza-Unspecified 04/20/2013  . Pneumococcal Conjugate-13 04/04/2014  . Pneumococcal Polysaccharide-23 04/20/2013, 04/16/2018  . Tdap 07/21/2009    Qualifies for Shingles Vaccine? Not a candidate for Zostavax  Screening Tests Health Maintenance  Topic Date Due  . TETANUS/TDAP  07/22/2019  . HEMOGLOBIN A1C  08/04/2019  . OPHTHALMOLOGY EXAM  01/19/2020  . FOOT EXAM  02/04/2020  . MAMMOGRAM   05/01/2021  . INFLUENZA VACCINE  Completed  . DEXA SCAN  Completed  . Hepatitis C Screening  Completed  . PNA vac Low Risk Adult  Completed    Cancer Screenings: Lung: Low Dose CT Chest recommended if Age 67-80 years, 30 pack-year currently smoking OR have quit w/in 15years. Patient does not qualify. Breast:  Up to date on Mammogram? Yes, completed 05/02/2019   Up to date of Bone Density/Dexa? Yes, completed 06/28/2014 Colorectal: declined  Additional Screenings:  Hepatitis C Screening: 01/23/2016     Plan:    Patient will continue working on losing some weight.   I have personally reviewed and noted the following in the patient's chart:   . Medical and social history . Use of alcohol, tobacco or illicit drugs  . Current medications and supplements . Functional ability and status . Nutritional status . Physical activity . Advanced directives . List of other physicians . Hospitalizations, surgeries, and ER visits in previous 12 months . Vitals . Screenings to include cognitive, depression, and falls . Referrals and appointments  In addition, I have reviewed and discussed with patient certain preventive protocols, quality metrics, and best  practice recommendations. A written personalized care plan for preventive services as well as general preventive health recommendations were provided to patient.     Andrez Grime, LPN  [id number]

## 2019-07-13 NOTE — Progress Notes (Signed)
No critical labs need to be addressed urgently. We will discuss labs in detail at upcoming office visit.   

## 2019-07-17 ENCOUNTER — Encounter: Payer: Self-pay | Admitting: Emergency Medicine

## 2019-07-17 ENCOUNTER — Emergency Department: Payer: Medicare Other

## 2019-07-17 ENCOUNTER — Emergency Department
Admission: EM | Admit: 2019-07-17 | Discharge: 2019-07-17 | Disposition: A | Discharge status: Home / Self Care | Payer: Medicare Other | Attending: Emergency Medicine | Admitting: Emergency Medicine

## 2019-07-17 ENCOUNTER — Telehealth: Payer: Medicare Other | Admitting: Physician Assistant

## 2019-07-17 DIAGNOSIS — R197 Diarrhea, unspecified: Secondary | ICD-10-CM

## 2019-07-17 DIAGNOSIS — K921 Melena: Secondary | ICD-10-CM

## 2019-07-17 DIAGNOSIS — Z20828 Contact with and (suspected) exposure to other viral communicable diseases: Secondary | ICD-10-CM | POA: Insufficient documentation

## 2019-07-17 DIAGNOSIS — Z79899 Other long term (current) drug therapy: Secondary | ICD-10-CM | POA: Diagnosis not present

## 2019-07-17 DIAGNOSIS — K529 Noninfective gastroenteritis and colitis, unspecified: Secondary | ICD-10-CM

## 2019-07-17 DIAGNOSIS — I10 Essential (primary) hypertension: Secondary | ICD-10-CM | POA: Insufficient documentation

## 2019-07-17 DIAGNOSIS — R5381 Other malaise: Secondary | ICD-10-CM | POA: Diagnosis not present

## 2019-07-17 DIAGNOSIS — E039 Hypothyroidism, unspecified: Secondary | ICD-10-CM | POA: Insufficient documentation

## 2019-07-17 DIAGNOSIS — Z743 Need for continuous supervision: Secondary | ICD-10-CM | POA: Diagnosis not present

## 2019-07-17 DIAGNOSIS — R42 Dizziness and giddiness: Secondary | ICD-10-CM

## 2019-07-17 DIAGNOSIS — R410 Disorientation, unspecified: Secondary | ICD-10-CM | POA: Diagnosis not present

## 2019-07-17 DIAGNOSIS — Z9981 Dependence on supplemental oxygen: Secondary | ICD-10-CM | POA: Insufficient documentation

## 2019-07-17 DIAGNOSIS — N390 Urinary tract infection, site not specified: Secondary | ICD-10-CM | POA: Insufficient documentation

## 2019-07-17 DIAGNOSIS — E119 Type 2 diabetes mellitus without complications: Secondary | ICD-10-CM | POA: Insufficient documentation

## 2019-07-17 DIAGNOSIS — N200 Calculus of kidney: Secondary | ICD-10-CM | POA: Diagnosis not present

## 2019-07-17 DIAGNOSIS — R112 Nausea with vomiting, unspecified: Secondary | ICD-10-CM | POA: Diagnosis not present

## 2019-07-17 DIAGNOSIS — E860 Dehydration: Secondary | ICD-10-CM | POA: Insufficient documentation

## 2019-07-17 DIAGNOSIS — R1110 Vomiting, unspecified: Secondary | ICD-10-CM | POA: Diagnosis not present

## 2019-07-17 LAB — CBC WITH DIFFERENTIAL/PLATELET
Abs Immature Granulocytes: 0.07 10*3/uL (ref 0.00–0.07)
Basophils Absolute: 0 10*3/uL (ref 0.0–0.1)
Basophils Relative: 0 %
Eosinophils Absolute: 0.1 10*3/uL (ref 0.0–0.5)
Eosinophils Relative: 1 %
HCT: 42 % (ref 36.0–46.0)
Hemoglobin: 13.9 g/dL (ref 12.0–15.0)
Immature Granulocytes: 1 %
Lymphocytes Relative: 14 %
Lymphs Abs: 1.5 10*3/uL (ref 0.7–4.0)
MCH: 29 pg (ref 26.0–34.0)
MCHC: 33.1 g/dL (ref 30.0–36.0)
MCV: 87.5 fL (ref 80.0–100.0)
Monocytes Absolute: 1 10*3/uL (ref 0.1–1.0)
Monocytes Relative: 9 %
Neutro Abs: 7.9 10*3/uL — ABNORMAL HIGH (ref 1.7–7.7)
Neutrophils Relative %: 75 %
Platelets: 225 10*3/uL (ref 150–400)
RBC: 4.8 MIL/uL (ref 3.87–5.11)
RDW: 13.4 % (ref 11.5–15.5)
WBC: 10.5 10*3/uL (ref 4.0–10.5)
nRBC: 0 % (ref 0.0–0.2)

## 2019-07-17 LAB — COMPREHENSIVE METABOLIC PANEL
ALT: 12 U/L (ref 0–44)
AST: 17 U/L (ref 15–41)
Albumin: 4 g/dL (ref 3.5–5.0)
Alkaline Phosphatase: 71 U/L (ref 38–126)
Anion gap: 17 — ABNORMAL HIGH (ref 5–15)
BUN: 19 mg/dL (ref 8–23)
CO2: 26 mmol/L (ref 22–32)
Calcium: 9.6 mg/dL (ref 8.9–10.3)
Chloride: 93 mmol/L — ABNORMAL LOW (ref 98–111)
Creatinine, Ser: 0.78 mg/dL (ref 0.44–1.00)
GFR calc Af Amer: >60 mL/min (ref >60)
GFR calc non Af Amer: >60 mL/min (ref >60)
Glucose, Bld: 143 mg/dL — ABNORMAL HIGH (ref 70–99)
Potassium: 2.8 mmol/L — ABNORMAL LOW (ref 3.5–5.1)
Sodium: 136 mmol/L (ref 135–145)
Total Bilirubin: 0.7 mg/dL (ref 0.3–1.2)
Total Protein: 8.2 g/dL — ABNORMAL HIGH (ref 6.5–8.1)

## 2019-07-17 LAB — URINALYSIS, COMPLETE (UACMP) WITH MICROSCOPIC
Bilirubin Urine: NEGATIVE
Glucose, UA: >=500 mg/dL — AB
Ketones, ur: 5 mg/dL — AB
Nitrite: POSITIVE — AB
Protein, ur: 30 mg/dL — AB
Specific Gravity, Urine: 1.025 (ref 1.005–1.030)
pH: 5 (ref 5.0–8.0)

## 2019-07-17 LAB — C DIFFICILE QUICK SCREEN W PCR REFLEX
C Diff antigen: NEGATIVE
C Diff interpretation: NOT DETECTED
C Diff toxin: NEGATIVE

## 2019-07-17 LAB — LIPASE, BLOOD: Lipase: 22 U/L (ref 11–51)

## 2019-07-17 LAB — POC SARS CORONAVIRUS 2 AG: SARS Coronavirus 2 Ag: NEGATIVE

## 2019-07-17 MED ORDER — SODIUM CHLORIDE 0.9 % IV BOLUS
1000.0000 mL | Freq: Once | INTRAVENOUS | Status: AC
  Administered 2019-07-17: 16:00:00 1000 mL via INTRAVENOUS

## 2019-07-17 MED ORDER — METRONIDAZOLE 500 MG PO TABS
500.0000 mg | ORAL_TABLET | Freq: Once | ORAL | Status: AC
  Administered 2019-07-17: 500 mg via ORAL
  Filled 2019-07-17: qty 1

## 2019-07-17 MED ORDER — SODIUM CHLORIDE 0.9 % IV BOLUS
1000.0000 mL | Freq: Once | INTRAVENOUS | Status: AC
  Administered 2019-07-17: 14:00:00 1000 mL via INTRAVENOUS

## 2019-07-17 MED ORDER — ONDANSETRON HCL 4 MG/2ML IJ SOLN
4.0000 mg | Freq: Once | INTRAMUSCULAR | Status: AC
  Administered 2019-07-17: 4 mg via INTRAVENOUS
  Filled 2019-07-17: qty 2

## 2019-07-17 MED ORDER — POTASSIUM CHLORIDE CRYS ER 20 MEQ PO TBCR
40.0000 meq | EXTENDED_RELEASE_TABLET | Freq: Once | ORAL | Status: AC
  Administered 2019-07-17: 40 meq via ORAL
  Filled 2019-07-17: qty 2

## 2019-07-17 MED ORDER — CIPROFLOXACIN HCL 500 MG PO TABS
500.0000 mg | ORAL_TABLET | Freq: Once | ORAL | Status: AC
  Administered 2019-07-17: 500 mg via ORAL
  Filled 2019-07-17: qty 1

## 2019-07-17 MED ORDER — AMOXICILLIN-POT CLAVULANATE 875-125 MG PO TABS: 1.0000 | ORAL_TABLET | Freq: Once | ORAL | Status: DC

## 2019-07-17 MED ORDER — CIPROFLOXACIN HCL 500 MG PO TABS: 500.0000 mg | ORAL_TABLET | Freq: Two times a day (BID) | ORAL | 0 refills | Status: AC

## 2019-07-17 MED ORDER — METRONIDAZOLE 500 MG PO TABS: 500.0000 mg | ORAL_TABLET | Freq: Three times a day (TID) | ORAL | 0 refills | Status: AC

## 2019-07-17 MED ORDER — ONDANSETRON 4 MG PO TBDP: 4.0000 mg | ORAL_TABLET | Freq: Three times a day (TID) | ORAL | 0 refills | Status: DC | PRN

## 2019-07-17 MED ORDER — POTASSIUM CHLORIDE 10 MEQ/100ML IV SOLN
10.0000 meq | Freq: Once | INTRAVENOUS | Status: AC
  Administered 2019-07-17: 10 meq via INTRAVENOUS
  Filled 2019-07-17: qty 100

## 2019-07-17 NOTE — Progress Notes (Signed)
Based on what you shared with me, I feel your condition warrants further evaluation and I recommend that you be seen for a face to face office visit. Giving what you are endorsing to me, there seems to be either infectious or inflammatory cause of the diarrhea and concern for intestinal bleed noting the coloration and consistency of your stools. This can lead to anemia from blood loss which can cause you to feel lightheaded and dizzy as well as other more serious things. You need evaluation today, in the ER.    NOTE: If you entered your credit card information for this eVisit, you will not be charged. You may see a "hold" on your card for the $35 but that hold will drop off and you will not have a charge processed.   If you are having a true medical emergency please call 911.      For an urgent face to face visit, [facility] has five urgent care centers for your convenience:      NEW:  [facility] [place] [facility] [place] Get Driving Directions [id number] [place], [place] [address] . 10 am - 6pm Monday - Friday    [facility]) Get Driving Directions [id number] [place], [place] [address] . 10 am to 8 pm Monday-Friday . 12 pm to 8 pm [facility] at [facility] Get Driving Directions [id number] [place], [place], [place] [address] . 8 am to 8 pm Monday-Friday . 9 am to 6 pm Saturday . 11 am to 6 pm Sunday     [facility] at [facility] Get Driving Directions  [id number] [address] [place], [place] [address] . 8 am to 8 pm Monday-Friday . 8 am to 4 pm [facility] at [place] Get Driving Directions [id number] [place], [place] [address] . 12 pm to 6 pm Monday-Friday      Your e-visit answers were reviewed by a board certified advanced  clinical practitioner to complete your personal care plan.  Thank you for using e-Visits.

## 2019-07-17 NOTE — ED Notes (Signed)
This RN received call from pt's daughter who requested an update on the pt. Daughter informed that we will call when we we have lab and imaging results.

## 2019-07-17 NOTE — ED Provider Notes (Signed)
[facility] Emergency Department Provider Note  Time seen: 2:02 PM  I have reviewed the triage vital signs and the nursing notes.   HISTORY  Chief Complaint Emesis and Diarrhea   HPI Kaitlin Hamilton is a 68 y.o. female with a past medical history of anxiety, depression, hypothyroidism, gastric reflux, hypertension, pulmonary disorder on 4 L of oxygen 24/7, presents to the emergency department for nausea vomiting diarrhea.  According to the patient for the past 5 days she has been experiencing nausea vomiting and diarrhea with mild dull pain in the left lower quadrant of her abdomen.  States the vomiting is subsided somewhat but the diarrhea is worsening states approximately 5 or more episodes per day.  States for the first 2 to 3 days of her illness she did have fever but has not had a fever over the past 2 days.  Denies any cough or trouble breathing.  No chest pain.  No dysuria or hematuria.  Past Medical History:  Diagnosis Date  . Allergic rhinitis    uses Flonase daily  . Anesthesia complication    Per pt/ past endoscopy in 2015, the anesthetic spray in back throat caused her to have breathing problems  . Anxiety   . Bronchitis   . Chicken pox   . Constipation    takes Fiber daily  . Depression   . Diverticulosis   . Dysphagia   . GERD (gastroesophageal reflux disease)    takes Protonix daily  . H/O hiatal hernia   . Hemorrhoids   . History of bronchitis    2013  . History of colon polyps   . History of kidney stones   . Hyperlipidemia    lost 43 pounds and no meds required at present  . Hypertension    takes Verapamil and HCTZ daily  . Hypokalemia   . Hypothyroidism (acquired)    takes Synthroid daily  . Insomnia    doesn't take any meds for this  . Joint swelling    left thumb  . Migraines    last one about a month ago  . Non-insulin dependent type 2 diabetes mellitus ([place])    takes Glipizide daily  . OA (osteoarthritis)    left knee  .  Oxygen deficiency   . Oxygen dependent    Pt is on continuous O2 at 3 liters!  . Pneumonia    last time in 2006  . PONV (postoperative nausea and vomiting)   . Shortness of breath    with exertion;takes Singulair daily as well as Flonase  . Urinary frequency     Patient Active Problem List   Diagnosis Date Noted  . Acute pain of left shoulder 05/20/2019  . Urinary frequency 03/16/2019  . Vaginal itching 03/16/2019  . Fever 02/21/2019  . Nausea vomiting and diarrhea 02/21/2019  . Viral syndrome 02/21/2019  . Acute diarrhea 11/04/2018  . Acute diverticulitis 08/16/2018  . Uncontrolled type 2 diabetes mellitus with hyperglycemia ([place]) 07/20/2018  . MDD (recurrent major depressive disorder) in remission ([place]) 12/08/2017  . Chronic insomnia 12/08/2017  . [place] intertrigo 07/08/2016  . Right sciatic nerve pain 03/09/2015  . Chronic pain of left thumb 03/09/2015  . Allergic rhinitis 10/06/2014  . Chronic hypoxemic respiratory failure ([place]) 01/31/2014  . Cystocele 11/11/2013  . Hypertension associated with diabetes ([place]) 08/25/2013  . Hyperlipidemia associated with type 2 diabetes mellitus ([place]) 08/25/2013  . Common migraine 08/25/2013  . Hypothyroidism 08/25/2013  . OSA (obstructive sleep apnea) 08/22/2013  .  GERD (gastroesophageal reflux disease) 06/01/2013  . Esophageal dysmotility 05/17/2013  . UIP (usual interstitial pneumonitis) ([place]) 10/19/2012    Past Surgical History:  Procedure Laterality Date  . ABDOMINAL EXPLORATION SURGERY     For Ovarian Cyst   . APPENDECTOMY    . CHOLECYSTECTOMY    . COLONOSCOPY  05/2013   TA, severe diverticulosis, rpt 3 yrs ([place])  . ENTEROSCOPY N/A 08/12/2013   Procedure: ENTEROSCOPY;  Surgeon: Lafayette Dragon, MD;  Location: [facility];  Service: Endoscopy;  Laterality: N/A;  . ESOPHAGOGASTRODUODENOSCOPY    . left knee arthroscopy    . LITHOTRIPSY     x 2  . LUNG BIOPSY Right 06/29/2013   Procedure: LUNG BIOPSY;  Surgeon: Melrose Nakayama, MD;  Location: [place];  Service: Thoracic;  Laterality: Right;  . TCS    . TUBAL LIGATION    . VIDEO ASSISTED THORACOSCOPY Right 06/29/2013   Procedure: VIDEO ASSISTED THORACOSCOPY;  Surgeon: Melrose Nakayama, MD;  Location: [place];  Service: Thoracic;  Laterality: Right;    Prior to Admission medications   Medication Sig Start Date End Date Taking? Authorizing Provider  acetaminophen (TYLENOL) 500 MG tablet Take 1,000 mg by mouth every 6 (six) hours as needed for moderate pain.    [provider]  canagliflozin (INVOKANA) 100 MG TABS tablet Take 1 tablet (100 mg total) by mouth daily before breakfast. 02/04/19   Bedsole, Amy E, MD  diclofenac (VOLTAREN) 75 MG EC tablet Take 1 tablet (75 mg total) by mouth 2 (two) times daily. 05/20/19   Bedsole, Amy E, MD  FIBER PO Take 1 tablet by mouth daily.    [provider]  fluticasone (FLONASE) 50 MCG/ACT nasal spray Place 1 spray into both nostrils daily.    [provider]  hydrochlorothiazide (HYDRODIURIL) 12.5 MG tablet TAKE 1 TABLET DAILY ALONG WITH ONE OF THE LOSARTAN 50 MG TABLETS 02/04/19   Bedsole, Amy E, MD  levothyroxine (SYNTHROID) 125 MCG tablet TAKE 1 TABLET BY MOUTH  DAILY BEFORE BREAKFAST 11/22/18   Bedsole, Amy E, MD  losartan (COZAAR) 50 MG tablet TAKE 1 TABLET DAILY ALONG WITH 1 OF THE HCTZ 12.5MG  TABLETS 02/04/19   Bedsole, Amy E, MD  losartan-hydrochlorothiazide (HYZAAR) 50-12.5 MG tablet Take 1 tablet by mouth daily. 03/18/18   Bedsole, Amy E, MD  metFORMIN (GLUCOPHAGE-XR) 500 MG 24 hr tablet Take 3 tablets (1,500 mg total) by mouth daily with breakfast. 02/04/19   Bedsole, Amy E, MD  montelukast (SINGULAIR) 10 MG tablet Take 1 tablet (10 mg total) by mouth at bedtime. 02/04/19   Bedsole, Amy E, MD  mycophenolate (CELLCEPT) 500 MG tablet Take 2 tablets (1,000 mg total) by mouth 2 (two) times daily. 06/22/19 09/20/19  Parrett, Fonnie Mu, NP  NON FORMULARY Place 4 L into the nose daily. 10 Liters with  exertion    [provider]  nystatin cream (MYCOSTATIN) Apply 1 application topically 3 (three) times daily. 01/27/19   [name], MD  ONETOUCH ULTRA test strip CHECK BLOOD SUGAR DAILY. 01/31/19   Bedsole, Amy E, MD  pantoprazole (PROTONIX) 40 MG tablet TAKE 1 TABLET BY MOUTH TWO  TIMES DAILY 02/08/19   Bedsole, Amy E, MD  pioglitazone (ACTOS) 45 MG tablet Take 1 tablet (45 mg total) by mouth daily. 02/04/19   Bedsole, Amy E, MD  Pirfenidone (ESBRIET) 801 MG TABS Take 801 mg by mouth 3 (three) times daily. 10/05/18   Juanito Doom, MD  pravastatin (PRAVACHOL)  40 MG tablet Take 1qd 02/04/19   Bedsole, Amy E, MD  verapamil (CALAN-SR) 180 MG CR tablet TAKE 1 TABLET BY MOUTH  DAILY 02/08/19   [name], MD    Allergies  Allergen Reactions  . Adhesive [Tape]     Redness, bruising with any adhesives- bandaids, patches, etc.   . Atovaquone Itching  . Codeine Hives and Swelling  . Demerol [Meperidine] Hives and Swelling  . Hydrocodone Nausea Only  . Trazodone And Nefazodone Itching  . Sulfa Antibiotics Rash    Family History  Problem Relation Age of Onset  . Rheum arthritis Mother   . Rheum arthritis Sister   . Prostate cancer Brother   . Heart disease Brother   . Heart disease Maternal Grandmother   . Heart disease Maternal Grandfather   . Uterine cancer Daughter   . Colon cancer Neg Hx   . Breast cancer Neg Hx     Social History Social History   Tobacco Use  . Smoking status: Never Smoker  . Smokeless tobacco: Never Used  Substance Use Topics  . Alcohol use: No    Alcohol/week: 0.0 standard drinks  . Drug use: No    Review of Systems Constitutional: Subjective fever 3 days ago ENT: Negative for recent illness/congestion Cardiovascular: Negative for chest pain. Respiratory: Negative for shortness of breath.  Negative for cough. Gastrointestinal: Mild left lower quadrant abdominal pain.  Positive for nausea vomiting diarrhea. Genitourinary: Negative for  urinary compaints Musculoskeletal: Negative for musculoskeletal complaints Neurological: Negative for headache All other ROS negative  ____________________________________________   PHYSICAL EXAM:  VITAL SIGNS: ED Triage Vitals  Enc Vitals Group     BP 07/17/19 1401 131/64     Pulse Rate 07/17/19 1401 (!) 105     Resp 07/17/19 1401 18     Temp 07/17/19 1401 98.6 F (37 C)     Temp Source 07/17/19 1401 Oral     SpO2 07/17/19 1357 97 %     Weight --      Height --      Head Circumference --      Peak Flow --      Pain Score --      Pain Loc --      Pain Edu? --      Excl. in [place]? --    Constitutional: Alert and oriented. Well appearing and in no distress. Eyes: Normal exam ENT      Head: Normocephalic and atraumatic.      Mouth/Throat: Mucous membranes are moist. Cardiovascular: Normal rate, regular rhythm. No murmur Respiratory: Normal respiratory effort without tachypnea nor retractions. Breath sounds are clear  Gastrointestinal: Soft, minimal left lower quadrant tenderness otherwise benign abdominal exam without rebound guarding or distention. Musculoskeletal: Nontender with normal range of motion in all extremities.  Neurologic:  Normal speech and language. No gross focal neurologic deficits  Skin:  Skin is warm, dry and intact.  Psychiatric: Mood and affect are normal.  ____________________________________________   RADIOLOGY  IMPRESSION:  1. Colon is diffusely decompressed which makes assessment of wall  thickening difficult. However given that there appears to be some  subtle pericolonic edema along the transverse and left colon, a  relatively diffuse infectious or inflammatory colitis is a  consideration.  2. Diverticular disease in the colon without definite findings of  focal diverticulitis.  3. The liver shows diffusely decreased attenuation suggesting fat  deposition.  4. Bilateral nonobstructing nephrolithiasis.  5. Aortic Atherosclerois  (ICD10-170.0)  ____________________________________________   INITIAL IMPRESSION / ASSESSMENT AND PLAN / ED COURSE  Pertinent labs & imaging results that were available during my care of the patient were reviewed by me and considered in my medical decision making (see chart for details).   Patient presents to the emergency department for nausea vomiting diarrhea mild left lower quadrant pain.  Differential would include colitis, diverticulitis, gastroenteritis, viral infection such as Covid, UTI or other intra-abdominal pathology.  We will check labs, urinalysis, Covid swab as well as a CT scan of the abdomen.  Patient agreeable to plan of care.  We will treat nausea and IV hydrate while awaiting results.  Ct consistent with colitis.  Will dc with cipro/flagyl as pt states intolerance to augmentin.    TASHALA KLOCKO was evaluated in Emergency Department on 07/17/2019 for the symptoms described in the history of present illness. She was evaluated in the context of the global COVID-19 pandemic, which necessitated consideration that the patient might be at risk for infection with the SARS-CoV-2 virus that causes COVID-19. Institutional protocols and algorithms that pertain to the evaluation of patients at risk for COVID-19 are in a state of rapid change based on information released by regulatory bodies including the CDC and federal and state organizations. These policies and algorithms were followed during the patient's care in the ED.  ____________________________________________   FINAL CLINICAL IMPRESSION(S) / ED DIAGNOSES  Nausea vomiting diarrhea colitis   Harvest Dark, MD 07/19/19 1819

## 2019-07-17 NOTE — ED Provider Notes (Signed)
UA returned concerning for UTI. Dr. Kerman Passey was already planning on treating for colitis with cipro. Will continue this however will send urine culture to make sure infection is not resistant. Discussed plan with patient.    Nance Pear, MD 07/17/19 [phone]

## 2019-07-17 NOTE — ED Triage Notes (Signed)
Pt to ED by EMS with c/o of NVD that started on 12/23. Pt states 4 episodes of diarrhea today that she describes as foul smelling and vomiting yesterday. Pt also has c/o of lower abdominal pain.

## 2019-07-18 ENCOUNTER — Telehealth: Payer: Self-pay | Admitting: Pulmonary Disease

## 2019-07-18 ENCOUNTER — Telehealth: Payer: Self-pay | Admitting: Family Medicine

## 2019-07-18 ENCOUNTER — Telehealth: Payer: Self-pay | Admitting: Internal Medicine

## 2019-07-18 LAB — SARS CORONAVIRUS 2 (TAT 6-24 HRS): SARS Coronavirus 2: NEGATIVE

## 2019-07-18 NOTE — Telephone Encounter (Signed)
Called and spoke to pt.  Pt wishes to cancel PFT and CT at this time. Pt stated that she will call back to reschedule.   Rodena Piety, please advise.  thanks

## 2019-07-18 NOTE — Telephone Encounter (Signed)
Kaitlin Hamilton, please make sure that you call her daughter, Donella Stade, about the ct being rescheduled, thank you![phone]

## 2019-07-18 NOTE — Telephone Encounter (Signed)
Spoke with the pt's daughter and cancelled the 6MW and the OV with MR per her request  She states will call back to reschedule once the ct chest is rescheduled

## 2019-07-18 NOTE — Telephone Encounter (Signed)
I have called to CXL the CT. I had to leave a message for [facility] to CXL the PFT. I will forward this message to [facility] for when the patient calls back to schedule

## 2019-07-18 NOTE — Telephone Encounter (Signed)
Patient called today  She stated she was just released from the hospital over the weekend. On her paperwork they have asked to follow up with dr [name] in 2 days., Patient stated she already has an appointment coming up on 1/5 and wanted to know if she still needs to schedule the hospital follow up also   Patient is also requesting a call back from the nurse

## 2019-07-18 NOTE — Telephone Encounter (Signed)
Ok to keep scheduled appointment on 1/5 or need a separate visit?

## 2019-07-18 NOTE — Telephone Encounter (Signed)
No separate OV needed as long as she is having improvement in diarrhea, emesis, abdominal pain, also able to keep down antibiotics and fluids and no fever on antibiotics.

## 2019-07-18 NOTE — Telephone Encounter (Signed)
Patient advised.

## 2019-07-19 LAB — URINE CULTURE: Culture: >=100000 — AB

## 2019-07-19 LAB — GI PATHOGEN PANEL BY PCR, STOOL
Adenovirus F 40/41: NOT DETECTED
Astrovirus: NOT DETECTED
Campylobacter by PCR: DETECTED — AB
Cryptosporidium by PCR: NOT DETECTED
Cyclospora cayetanensis: NOT DETECTED
E coli (ETEC) LT/ST: NOT DETECTED
E coli (STEC): NOT DETECTED
Entamoeba histolytica: NOT DETECTED
Enteroaggregative E coli: NOT DETECTED
Enteropathogenic E coli: NOT DETECTED
G lamblia by PCR: NOT DETECTED
Norovirus GI/GII: NOT DETECTED
Plesiomonas shigelloides: NOT DETECTED
Rotavirus A by PCR: NOT DETECTED
Salmonella by PCR: NOT DETECTED
Sapovirus: NOT DETECTED
Shigella by PCR: NOT DETECTED
Vibrio cholerae: NOT DETECTED
Vibrio: NOT DETECTED
Yersinia enterocolitica: NOT DETECTED

## 2019-07-20 ENCOUNTER — Other Ambulatory Visit: Payer: Medicare Other

## 2019-07-21 ENCOUNTER — Ambulatory Visit: Payer: Medicare Other

## 2019-07-25 LAB — HM DIABETES FOOT EXAM

## 2019-07-26 ENCOUNTER — Other Ambulatory Visit: Payer: Self-pay

## 2019-07-26 ENCOUNTER — Encounter: Payer: Self-pay | Admitting: Family Medicine

## 2019-07-26 ENCOUNTER — Ambulatory Visit (INDEPENDENT_AMBULATORY_CARE_PROVIDER_SITE_OTHER): Payer: Medicare Other | Admitting: Family Medicine

## 2019-07-26 VITALS — BP 110/60 | HR 93 | Temp 97.6°F | Ht 65.63 in | Wt 228.2 lb

## 2019-07-26 DIAGNOSIS — Z0000 Encounter for general adult medical examination without abnormal findings: Secondary | ICD-10-CM

## 2019-07-26 DIAGNOSIS — E785 Hyperlipidemia, unspecified: Secondary | ICD-10-CM

## 2019-07-26 DIAGNOSIS — N390 Urinary tract infection, site not specified: Secondary | ICD-10-CM

## 2019-07-26 DIAGNOSIS — B9620 Unspecified Escherichia coli [E. coli] as the cause of diseases classified elsewhere: Secondary | ICD-10-CM

## 2019-07-26 DIAGNOSIS — J9611 Chronic respiratory failure with hypoxia: Secondary | ICD-10-CM

## 2019-07-26 DIAGNOSIS — E1169 Type 2 diabetes mellitus with other specified complication: Secondary | ICD-10-CM | POA: Diagnosis not present

## 2019-07-26 DIAGNOSIS — E1159 Type 2 diabetes mellitus with other circulatory complications: Secondary | ICD-10-CM | POA: Diagnosis not present

## 2019-07-26 DIAGNOSIS — F3340 Major depressive disorder, recurrent, in remission, unspecified: Secondary | ICD-10-CM

## 2019-07-26 DIAGNOSIS — J84112 Idiopathic pulmonary fibrosis: Secondary | ICD-10-CM

## 2019-07-26 DIAGNOSIS — I10 Essential (primary) hypertension: Secondary | ICD-10-CM

## 2019-07-26 DIAGNOSIS — E039 Hypothyroidism, unspecified: Secondary | ICD-10-CM

## 2019-07-26 DIAGNOSIS — A045 Campylobacter enteritis: Secondary | ICD-10-CM

## 2019-07-26 DIAGNOSIS — E118 Type 2 diabetes mellitus with unspecified complications: Secondary | ICD-10-CM | POA: Diagnosis not present

## 2019-07-26 MED ORDER — DAPAGLIFLOZIN PROPANEDIOL 10 MG PO TABS: 10.0000 mg | ORAL_TABLET | Freq: Every day | ORAL | 11 refills | Status: DC

## 2019-07-26 NOTE — Patient Instructions (Addendum)
Stop invokana due to insurance and start farxiga 10 mg dialy.  Continue other medications. Get back on track with low cholesterol diet, exercise as much as able.  Let us know  If you decide to move forward with cologuard or stool cards.

## 2019-07-26 NOTE — Progress Notes (Signed)
Chief Complaint  Patient presents with  . Annual Exam    Part 2    History of Present Illness: HPI The patient presents for complete physical and review of chronic health problems. He/She also has the following acute concerns today: Seen in ED on 07/17/2019 with colitis  UA also concerning for UTI.  GI pathogen panel showed campylobacter!  Urine culture with ECOLI sens to cipro  Neg Cdiff  Treated with cipro She reports she has had improved diarrhea, no N/V, no fever.  The patient saw a LPN or RN for medicare wellness visit.  Prevention and wellness was reviewed in detail. Note reviewed and important notes copied below. NO CONCERNS  07/26/19   Chronic lung issues ( UIP) followed by pulmonary. On Esbriet and cellcept.  Diabetes:   Good control on max actos, invokana  Max metformin. Onglyza too costly.  Lab Results  Component Value Date   HGBA1C 6.7 (H) 07/12/2019  Using medications without difficulties: Hypoglycemic episodes: none Hyperglycemic episodes: yes Feet problems: no ulcers Blood Sugars averaging: FBS   eye exam within last year:01/2019  Wt Readings from Last 3 Encounters:  07/26/19 228 lb 4 oz (103.5 kg)  07/17/19 218 lb (98.9 kg)  05/20/19 233 lb 8 oz (105.9 kg)      Elevated Cholesterol:  LDL  no longer  at goal on  pravachol Lab Results  Component Value Date   CHOL 234 (H) 07/12/2019   HDL 75.30 07/12/2019   LDLCALC 134 (H) 07/12/2019   LDLDIRECT 104.0 09/04/2017   TRIG 124.0 07/12/2019   CHOLHDL 3 07/12/2019   Using medications without problems: Muscle aches:  Diet compliance:good Exercise: working on it Other complaints:  Hypertension:   Good control on losartan HCTZ  BP Readings from Last 3 Encounters:  07/26/19 110/60  07/17/19 (!) 145/65  05/20/19 130/64  Using medication without problems or lightheadedness: none Chest pain with exertion: none Edema:none Short of breath: yes Average home BPs: Other issues:   OSA:   CPAP  Hypothyroidism:  Stable control. Lab Results  Component Value Date   TSH 1.04 07/12/2019     This visit occurred during the SARS-CoV-2 public health emergency.  Safety protocols were in place, including screening questions prior to the visit, additional usage of staff PPE, and extensive cleaning of exam room while observing appropriate contact time as indicated for disinfecting solutions.   COVID 19 screen:  No recent travel or known exposure to COVID19 The patient denies respiratory symptoms of COVID 19 at this time. The importance of social distancing was discussed today.     Review of Systems  Constitutional: Negative for chills and fever.  HENT: Negative for congestion and ear pain.   Eyes: Negative for pain and redness.  Respiratory: Negative for cough and shortness of breath.   Cardiovascular: Negative for chest pain, palpitations and leg swelling.  Gastrointestinal: Negative for abdominal pain, blood in stool, constipation, diarrhea, nausea and vomiting.  Genitourinary: Negative for dysuria.  Musculoskeletal: Positive for neck pain. Negative for falls and myalgias.       Using voltaren gel for pain in neck.  Skin: Negative for rash.  Neurological: Negative for dizziness.  Psychiatric/Behavioral: Negative for depression. The patient is not nervous/anxious.       Past Medical History:  Diagnosis Date  . Allergic rhinitis    uses Flonase daily  . Anesthesia complication    Per pt/ past endoscopy in 2015, the anesthetic spray in back throat caused her to have breathing  problems  . Anxiety   . Bronchitis   . Chicken pox   . Constipation    takes Fiber daily  . Depression   . Diverticulosis   . Dysphagia   . GERD (gastroesophageal reflux disease)    takes Protonix daily  . H/O hiatal hernia   . Hemorrhoids   . History of bronchitis    2013  . History of colon polyps   . History of kidney stones   . Hyperlipidemia    lost 43 pounds and no meds required at  present  . Hypertension    takes Verapamil and HCTZ daily  . Hypokalemia   . Hypothyroidism (acquired)    takes Synthroid daily  . Insomnia    doesn't take any meds for this  . Joint swelling    left thumb  . Migraines    last one about a month ago  . Non-insulin dependent type 2 diabetes mellitus ([place])    takes Glipizide daily  . OA (osteoarthritis)    left knee  . Oxygen deficiency   . Oxygen dependent    Pt is on continuous O2 at 3 liters!  . Pneumonia    last time in 2006  . PONV (postoperative nausea and vomiting)   . Shortness of breath    with exertion;takes Singulair daily as well as Flonase  . Urinary frequency     reports that she has never smoked. She has never used smokeless tobacco. She reports that she does not drink alcohol or use drugs.   Current Outpatient Medications:  .  acetaminophen (TYLENOL) 500 MG tablet, Take 1,000 mg by mouth every 6 (six) hours as needed for moderate pain., Disp: , Rfl:  .  canagliflozin (INVOKANA) 100 MG TABS tablet, Take 1 tablet (100 mg total) by mouth daily before breakfast., Disp: 90 tablet, Rfl: 3 .  ciprofloxacin (CIPRO) 500 MG tablet, Take 1 tablet (500 mg total) by mouth 2 (two) times daily for 14 days., Disp: 28 tablet, Rfl: 0 .  diclofenac (VOLTAREN) 75 MG EC tablet, Take 1 tablet (75 mg total) by mouth 2 (two) times daily., Disp: 30 tablet, Rfl: 0 .  FIBER PO, Take 1 tablet by mouth daily., Disp: , Rfl:  .  fluticasone (FLONASE) 50 MCG/ACT nasal spray, Place 1 spray into both nostrils daily., Disp: , Rfl:  .  hydrochlorothiazide (HYDRODIURIL) 12.5 MG tablet, TAKE 1 TABLET DAILY ALONG WITH ONE OF THE LOSARTAN 50 MG TABLETS, Disp: 90 tablet, Rfl: 3 .  levothyroxine (SYNTHROID) 125 MCG tablet, TAKE 1 TABLET BY MOUTH  DAILY BEFORE BREAKFAST, Disp: 90 tablet, Rfl: 3 .  losartan (COZAAR) 50 MG tablet, TAKE 1 TABLET DAILY ALONG WITH 1 OF THE HCTZ 12.5MG  TABLETS, Disp: 90 tablet, Rfl: 3 .  losartan-hydrochlorothiazide (HYZAAR)  50-12.5 MG tablet, Take 1 tablet by mouth daily., Disp: 90 tablet, Rfl: 1 .  metFORMIN (GLUCOPHAGE-XR) 500 MG 24 hr tablet, Take 3 tablets (1,500 mg total) by mouth daily with breakfast., Disp: 270 tablet, Rfl: 3 .  metroNIDAZOLE (FLAGYL) 500 MG tablet, Take 1 tablet (500 mg total) by mouth 3 (three) times daily for 14 days., Disp: 42 tablet, Rfl: 0 .  montelukast (SINGULAIR) 10 MG tablet, Take 1 tablet (10 mg total) by mouth at bedtime., Disp: 90 tablet, Rfl: 3 .  mycophenolate (CELLCEPT) 500 MG tablet, Take 2 tablets (1,000 mg total) by mouth 2 (two) times daily., Disp: 360 tablet, Rfl: 0 .  NON FORMULARY, Place 4 L into the nose  daily. 10 Liters with exertion, Disp: , Rfl:  .  nystatin cream (MYCOSTATIN), Apply 1 application topically 3 (three) times daily., Disp: 30 g, Rfl: 0 .  ondansetron (ZOFRAN ODT) 4 MG disintegrating tablet, Take 1 tablet (4 mg total) by mouth every 8 (eight) hours as needed for nausea or vomiting., Disp: 20 tablet, Rfl: 0 .  ONETOUCH ULTRA test strip, CHECK BLOOD SUGAR DAILY., Disp: 100 strip, Rfl: 0 .  pantoprazole (PROTONIX) 40 MG tablet, TAKE 1 TABLET BY MOUTH TWO  TIMES DAILY, Disp: 180 tablet, Rfl: 2 .  pioglitazone (ACTOS) 45 MG tablet, Take 1 tablet (45 mg total) by mouth daily., Disp: 90 tablet, Rfl: 3 .  Pirfenidone (ESBRIET) 801 MG TABS, Take 801 mg by mouth 3 (three) times daily., Disp: 180 tablet, Rfl: 6 .  pravastatin (PRAVACHOL) 40 MG tablet, Take 1qd, Disp: 90 tablet, Rfl: 3 .  verapamil (CALAN-SR) 180 MG CR tablet, TAKE 1 TABLET BY MOUTH  DAILY, Disp: 90 tablet, Rfl: 2   Observations/Objective: Blood pressure 110/60, pulse 93, temperature 97.6 F (36.4 C), temperature source Temporal, height 5' 5.63" (1.667 m), weight 228 lb 4 oz (103.5 kg), SpO2 98 %.  Physical Exam Constitutional:      General: She is not in acute distress.    Appearance: Normal appearance. She is well-developed. She is obese. She is not ill-appearing or toxic-appearing.      Comments: On continuous oxygen  HENT:     Head: Normocephalic.     Right Ear: Hearing, tympanic membrane, ear canal and external ear normal.     Left Ear: Hearing, tympanic membrane, ear canal and external ear normal.     Nose: Nose normal.  Eyes:     General: Lids are normal. Lids are everted, no foreign bodies appreciated.     Conjunctiva/sclera: Conjunctivae normal.     Pupils: Pupils are equal, round, and reactive to light.  Neck:     Thyroid: No thyroid mass or thyromegaly.     Vascular: No carotid bruit.     Trachea: Trachea normal.  Cardiovascular:     Rate and Rhythm: Normal rate and regular rhythm.     Heart sounds: Normal heart sounds, S1 normal and S2 normal. No murmur. No gallop.   Pulmonary:     Effort: Pulmonary effort is normal. No respiratory distress.     Breath sounds: Normal breath sounds. No wheezing, rhonchi or rales.  Abdominal:     General: Bowel sounds are normal. There is no distension or abdominal bruit.     Palpations: Abdomen is soft. There is no fluid wave or mass.     Tenderness: There is no abdominal tenderness. There is no guarding or rebound.     Hernia: No hernia is present.  Musculoskeletal:     Cervical back: Normal range of motion and neck supple.  Lymphadenopathy:     Cervical: No cervical adenopathy.  Skin:    General: Skin is warm and dry.     Findings: No rash.  Neurological:     Mental Status: She is alert.     Cranial Nerves: No cranial nerve deficit.     Sensory: No sensory deficit.  Psychiatric:        Mood and Affect: Mood is not anxious or depressed.        Speech: Speech normal.        Behavior: Behavior normal. Behavior is cooperative.        Judgment: Judgment normal.  Diabetic foot exam: Normal inspection No skin breakdown No calluses  Normal DP pulses Normal sensation to light touch and monofilament Nails normal  Assessment and Plan   The patient's preventative maintenance and recommended screening tests  for an annual wellness exam were reviewed in full today. Brought up to date unless services declined.  Counselled on the importance of diet, exercise, and its role in overall health and mortality. The patient's FH and SH was reviewed, including their home life, tobacco status, and drug and alcohol status.   Vaccines:uptodate except Td Colon: 2014, Dr. Hilarie Fredrickson, sessile polyps, recommended repeat in 3 year.Pt refused given sedation needed.  Considering cologuard vs stool cards. PAP/DVE: Pap not indicated. Daughter with ovarian cancer. Asymptomatic today Nonsmoker HIV: refused mammo: nml10/2020 Controlled diabetes mellitus type 2 with complications ([place]) Complication HTN. Good control on max actos, invokana  Max metformin. Stop invokana due to insurance and start farxiga 10 mg daily.   Hypertension associated with diabetes ([place]) Well controlled. Continue current medication.   Hyperlipidemia associated with type 2 diabetes mellitus ([place]) No longer at goal. Reviewed low chol diet and pt will get back to improved lifestyle changes.  Hypothyroidism Well controlled. Continue current medication.   MDD (recurrent major depressive disorder) in remission ([place])  Stable control    Clinical Support from 07/12/2019 in [place] at [facility] Total Score  1       UIP (usual interstitial pneumonitis) ([place])  Followed by pulmonary.    [name], MD

## 2019-07-27 ENCOUNTER — Ambulatory Visit: Payer: Medicare Other | Admitting: Internal Medicine

## 2019-07-27 ENCOUNTER — Ambulatory Visit: Payer: Medicare Other

## 2019-07-28 ENCOUNTER — Telehealth: Payer: Self-pay | Admitting: Pulmonary Disease

## 2019-07-28 ENCOUNTER — Other Ambulatory Visit: Payer: Self-pay | Admitting: Family Medicine

## 2019-07-28 NOTE — Telephone Encounter (Signed)
Erica please look out for forms.

## 2019-08-01 NOTE — Telephone Encounter (Signed)
I have received the forms and will give to Dr. Vaughan Browner when he returns to the office on 08/05/19.

## 2019-08-04 ENCOUNTER — Other Ambulatory Visit: Payer: Self-pay

## 2019-08-04 ENCOUNTER — Ambulatory Visit
Admission: RE | Admit: 2019-08-04 | Discharge: 2019-08-04 | Disposition: A | Discharge status: Home / Self Care | Payer: Medicare Other | Source: Ambulatory Visit | Attending: Pulmonary Disease | Admitting: Pulmonary Disease

## 2019-08-04 DIAGNOSIS — J849 Interstitial pulmonary disease, unspecified: Secondary | ICD-10-CM | POA: Diagnosis not present

## 2019-08-04 DIAGNOSIS — J8410 Pulmonary fibrosis, unspecified: Secondary | ICD-10-CM | POA: Diagnosis not present

## 2019-08-05 ENCOUNTER — Ambulatory Visit: Payer: Medicare Other | Admitting: Pulmonary Disease

## 2019-08-05 ENCOUNTER — Ambulatory Visit: Payer: Medicare Other

## 2019-08-05 DIAGNOSIS — J84111 Idiopathic interstitial pneumonia, not otherwise specified: Secondary | ICD-10-CM | POA: Diagnosis not present

## 2019-08-05 DIAGNOSIS — J8489 Other specified interstitial pulmonary diseases: Secondary | ICD-10-CM | POA: Diagnosis not present

## 2019-08-05 DIAGNOSIS — G4733 Obstructive sleep apnea (adult) (pediatric): Secondary | ICD-10-CM | POA: Diagnosis not present

## 2019-08-05 NOTE — Telephone Encounter (Signed)
Forms have been faxed and Copy mailed. Patient made aware.

## 2019-08-15 ENCOUNTER — Ambulatory Visit (INDEPENDENT_AMBULATORY_CARE_PROVIDER_SITE_OTHER): Payer: Medicare Other

## 2019-08-15 ENCOUNTER — Encounter: Payer: Self-pay | Admitting: Pulmonary Disease

## 2019-08-15 ENCOUNTER — Other Ambulatory Visit: Payer: Self-pay

## 2019-08-15 ENCOUNTER — Ambulatory Visit: Payer: Medicare Other | Admitting: Pulmonary Disease

## 2019-08-15 VITALS — BP 116/58 | HR 89 | Temp 97.1°F | Ht 66.5 in | Wt 227.0 lb

## 2019-08-15 DIAGNOSIS — J84112 Idiopathic pulmonary fibrosis: Secondary | ICD-10-CM

## 2019-08-15 DIAGNOSIS — J849 Interstitial pulmonary disease, unspecified: Secondary | ICD-10-CM

## 2019-08-15 DIAGNOSIS — J9611 Chronic respiratory failure with hypoxia: Secondary | ICD-10-CM

## 2019-08-15 DIAGNOSIS — G4733 Obstructive sleep apnea (adult) (pediatric): Secondary | ICD-10-CM

## 2019-08-15 NOTE — Progress Notes (Signed)
       Kaitlin Hamilton    [id number]    04/11/1951  Primary Care Physician:Bedsole, Amy E, MD  Referring Physician: Bedsole, Amy E, MD [address] [place],  [place] [address]  Chief complaint: Follow-up for interstitial lung disease  HPI: 69-year-old with history of biopsy-proven UIP pulmonary fibrosis.  Previously followed by Dr. McQuaid Surgical lung biopsy in December 2014 with findings showing UIP fibrosis.  There is strong suspicion for underlying connective tissue disease as she responds to prednisone.  Maintain on CellCept 500 mg twice daily which was increased to 1 g twice daily and 2019.  She has also been evaluated by Dr. Lake Morrison at [facility]. Esbriet started in 2018 for progressive pulmonary fibrosis.  Continues on Esbriet, CellCept 1 g twice daily.  She is off prednisone at present States that breathing is doing well She has had 2 episodes of diverticulitis in 2019 and 2020, E. coli UTI in 2020 and Campylobacter colitis in December 2020.  Patient is here with daughter and question if CellCept is causing her to have recurrent nonpulmonary infections.  Outpatient Encounter Medications as of 08/15/2019  Medication Sig  . acetaminophen (TYLENOL) 500 MG tablet Take 1,000 mg by mouth every 6 (six) hours as needed for moderate pain.  . dapagliflozin propanediol (FARXIGA) 10 MG TABS tablet Take 10 mg by mouth daily.  . diclofenac (VOLTAREN) 75 MG EC tablet Take 1 tablet (75 mg total) by mouth 2 (two) times daily.  . FIBER PO Take 1 tablet by mouth daily.  . fluticasone (FLONASE) 50 MCG/ACT nasal spray Place 1 spray into both nostrils daily.  . hydrochlorothiazide (HYDRODIURIL) 12.5 MG tablet TAKE 1 TABLET DAILY ALONG WITH ONE OF THE LOSARTAN 50 MG TABLETS  . levothyroxine (SYNTHROID) 125 MCG tablet TAKE 1 TABLET BY MOUTH  DAILY BEFORE BREAKFAST  . losartan (COZAAR) 50 MG tablet TAKE 1 TABLET DAILY ALONG WITH 1 OF THE HCTZ 12.5MG TABLETS  . losartan-hydrochlorothiazide  (HYZAAR) 50-12.5 MG tablet Take 1 tablet by mouth daily.  . metFORMIN (GLUCOPHAGE-XR) 500 MG 24 hr tablet Take 3 tablets (1,500 mg total) by mouth daily with breakfast.  . montelukast (SINGULAIR) 10 MG tablet Take 1 tablet (10 mg total) by mouth at bedtime.  . mycophenolate (CELLCEPT) 500 MG tablet Take 2 tablets (1,000 mg total) by mouth 2 (two) times daily.  . NON FORMULARY Place 4 L into the nose daily. 10 Liters with exertion  . nystatin cream (MYCOSTATIN) Apply 1 application topically 3 (three) times daily.  . ONETOUCH ULTRA test strip CHECK BLOOD SUGAR DAILY.  . pantoprazole (PROTONIX) 40 MG tablet TAKE 1 TABLET BY MOUTH TWO  TIMES DAILY  . pioglitazone (ACTOS) 45 MG tablet Take 1 tablet (45 mg total) by mouth daily.  . Pirfenidone (ESBRIET) 801 MG TABS Take 801 mg by mouth 3 (three) times daily.  . pravastatin (PRAVACHOL) 40 MG tablet Take 1qd  . verapamil (CALAN-SR) 180 MG CR tablet TAKE 1 TABLET BY MOUTH  DAILY  . [DISCONTINUED] ondansetron (ZOFRAN ODT) 4 MG disintegrating tablet Take 1 tablet (4 mg total) by mouth every 8 (eight) hours as needed for nausea or vomiting. (Patient not taking: Reported on 08/15/2019)   No facility-administered encounter medications on file as of 08/15/2019.   Physical Exam: Blood pressure (!) 116/58, pulse 89, temperature (!) 97.1 F (36.2 C), temperature source Temporal, height 5' 6.5" (1.689 m), weight 227 lb (103 kg), SpO2 98 %. Gen:        No acute distress HEENT:  EOMI, sclera anicteric Neck:     No masses; no thyromegaly Lungs:    Clear to auscultation bilaterally; normal respiratory effort CV:         Regular rate and rhythm; no murmurs Abd:      + bowel sounds; soft, non-tender; no palpable masses, no distension Ext:    No edema; adequate peripheral perfusion Skin:      Warm and dry; no rash Neuro: alert and oriented x 3 Psych: normal mood and affect  Data Reviewed: Imaging: 11/2012 CT chest >>  centrilobular nodules, interlobular septal  thickening worse in bases and periphery R lung > L; some GGO in bases and periphery as well, some bronchiectasis in the bases R > L; findings suggestive of fibrosis but not UIP; question hypersensitivity pneumonitis given centrilobular nodules; also question aspiration  04/2013 Barium swallow> abnormal esophageal motility, GERD, hiatal hernia  04/2013 CT chest ([name])> findings suggestive of but not diagnostic of NSIP, small pulmonary nodule  February 2016 CT chest high resolution> slight progression in reticular abnormalities consistent with pulmonary fibrosis, uip  High-resolution CT chest 08/04/2019-basilar predominant fibrotic interstitial lung disease with mild honeycombing.  Mild progressions 2016.  UIP pattern  I have reviewed the images personally  PFTs: 04/28/2018 FVC 1.1 [35%], FEV1 1.02 [42%], F/F 92, DLCO 8.88 [36%] Severe restriction, diffusion impairment. With worsening compared to prior years.  6 minute walk 10/19/2012 walked 500 feet in office on room air oxygenation did not drop below 90% 04/2013 6MW RA > 1100 feet, HR peak 109, O2 sat Nadir 87% 11/2013 6MW 1364 feet, O2 saturation nadir 78% 08/21/14 6MW 1400 feet 90% on 8L  Labs: 03/2013 ANA, ANCA, Anti-Jo-1, ESR, RF, SCL-70, anti-centromere, SSA/SSB, all negative; CRP 0.6;   Assessment:  UIP fibrosis Presumed autoimmune component given response to immunosuppressive's in the past Currently maintained on CellCept 1 g twice daily, pirfenidone. Symptomatically she is stable. Schedule pulmonary function test for monitoring  She has had 3 episodes of nonpulmonary infections including UTI, diverticulitis and colitis over the past year.  We will reduce CellCept to 500 mg twice daily and monitor her  Plan/Recommendations: - Continue pirfenidone, reduce CellCept 500 mg twice daily - Continue supplemental oxygen - Schedule spirometry, diffusion capacity  This appointment required 45 minutes of patient care (this includes  precharting, chart review, review of results, face-to-face care, etc.).    MD [facility] 08/15/2019, 10:35 AM  CC: Bedsole, Amy E, MD   

## 2019-08-15 NOTE — Progress Notes (Signed)
SIX MIN WALK 08/15/2019 04/28/2018 01/26/2018 07/10/2017 12/18/2016 11/13/2016 09/19/2016  Medications HCTZ 12.5mg , diclofenac 75mg , fiber OTC, Synthroid 162mcg, Losartan 50mg , Cellcept 1000mg , Actos 45mg , Esbriet 801mg , pravastatin 40mg  and verapamil 180mg  were all taken this morning around 645am. - OTC Fiber tab, Flonase 1 spray each nostril, Synthroid 125 MCG, Hyzaar 50/12.5mg , Metformin 1000mg , Cellcept 500mg , Protonix 40mg , Esbriet 801mg - all taken approx 5:15 am.  - FLonase 35mcg, levothyroxine,cellcept 500mg ,nystatin 100000 unit,protonix 40mg ,pirfenidone,saxagliptin 5mg ,calan 180mg   - Patient took all meds on her medication list except ones that stated to take QHS  Supplimental Oxygen during Test? (L/min) Yes Yes No Yes Yes Yes Yes  O2 Flow Rate (No Data) 10 - 6 6 8 4   Type Continuous - - Continuous Continuous Continuous Continuous  Laps 11 - 6 8 5  - 3  Partial Lap (in Meters) 17 - 0 0 0 - 0  Baseline BP (sitting) 124/82 - 130/74 130/80 130/72 - 132/80  Baseline Heartrate 85 - 77 70 87 - 94  Baseline Dyspnea (Borg Scale) 0.5 - 0 0 0.5 - 1  Baseline Fatigue (Borg Scale) 0.5 - 0 0 0 - 4  Baseline SPO2 98 - 99 100 98 - 100  BP (sitting) 142/84 - 164/68 158/80 136/80 - 132/68  Heartrate 106 - 123 110 113 - 116  Dyspnea (Borg Scale) 4 - 9 5 3  - 5  Fatigue (Borg Scale) 5 - 8 5 2  - 9  SPO2 94 - 89 98 92 - 79  BP (sitting) 140/76 - 146/72 154/64 132/74 - 126/72  Heartrate 94 - 110 89 94 - 89  SPO2 99 - 97 100 97 - 99  Stopped or Paused before Six Minutes Yes - Yes Yes Yes - Yes  Other Symptoms at end of Exercise Patient stopped at 4:26, O2 was 80%, HR 110 on 4L. She had increased SOB, increased O2 to 6L. She stopped again with 2:25 left, O2 was 91%, HR was 108 at 6L, SOB increased. She walked at a steady pace while pushing the O2 tank. She was a little unsteady on her feet towards the end of her walk. Declined any CP, leg pain. Did state that her SOB was starting to decease. - Pt stopped after 3 laps  (2:15 in to walk test) and O2 was at 82% on 6lpm.  O2 was increased to 8lpm, and sats increased to 96%.  Pt rested for 3:32, then began to walk again on 8lpm.  Pt stopped walk test 4 laps later, with 37 seconds left in walk.  Pt was fatigue after 4 laps at 2:57, desat to 85%, pulse 105, placed her on 8L and then she finished the 6 min walk,  Pt started off walk on 4L pt stopped due to fatigue and dayspnea after lap 2 stats were checked O2 84% 112HR, O2 bumped to 5L pt got back up to 93% 100 HR and walk was continued, pt needed to stop again after lap 3 due to dyspnea stats were checked O2 was 87% 116 HR, bumped up to 6L, 6L got pt up to 94% 117 HR, 6L helped maintained pt above 90% for the rest of walk - pt stopped at 3:41min left on the clock d/t increased SOB and low O2 saturations.   Interpretation - - Dizziness;Leg pain - Leg pain - -  Distance Completed 391 - 288 384 240 - 144  Tech Comments: - steady walk, 10L to to stay above 88% TA/CMA - I did notice when she sits down  her oxygen level drops TA/CMA Pt walked a steady pace with oxygen tank, she was able to complete walk, pt did express dyspnea, and slight chest tightness during walk Pt was started out w/o 02 98% oxygen hr 103. At 185 ft 02 85 hr 131 8 L; patient walked continuous 02 titrated final lap 6 02 91% hr 114 on 8L. pt stopped at 3:17min left on the clock d/t increased SOB and low O2 saturations. pt was allowed to sit and rest for a few minutes and was then placed back on 4 liters and an ambulatory walk/O2 titration was done to titrate O2. Pt ended up needing 6 liters O2 to maintain her O2 saturations above 88%.  --amg

## 2019-08-15 NOTE — Patient Instructions (Signed)
We will reduce the CellCept to 1 tablet (500mg ) twice daily Continue the Esbriet We will schedule you for perimetry and diffusion capacity Follow-up in 1 to 2 months.

## 2019-08-18 NOTE — Assessment & Plan Note (Signed)
Well controlled. Continue current medication.  

## 2019-08-18 NOTE — Assessment & Plan Note (Signed)
Stable control    Clinical Support from 07/12/2019 in [place] at [facility] Total Score  1

## 2019-08-18 NOTE — Assessment & Plan Note (Addendum)
Complication HTN. Good control on max actos, invokana  Max metformin. Stop invokana due to insurance and start farxiga 10 mg daily.

## 2019-08-18 NOTE — Assessment & Plan Note (Signed)
Followed by pulmonary 

## 2019-08-18 NOTE — Assessment & Plan Note (Signed)
No longer at goal. Reviewed low chol diet and pt will get back to improved lifestyle changes.

## 2019-08-31 NOTE — Telephone Encounter (Signed)
Ct was done on 08/04/2019. I forwarded this message to Suanne Marker because [place] sent it to me and I was covering Suanne Marker while she was off.  It was my mistake. PFT has been reschedule to 10/12/2019

## 2019-09-05 DIAGNOSIS — J84111 Idiopathic interstitial pneumonia, not otherwise specified: Secondary | ICD-10-CM | POA: Diagnosis not present

## 2019-09-05 DIAGNOSIS — J8489 Other specified interstitial pulmonary diseases: Secondary | ICD-10-CM | POA: Diagnosis not present

## 2019-09-05 DIAGNOSIS — G4733 Obstructive sleep apnea (adult) (pediatric): Secondary | ICD-10-CM | POA: Diagnosis not present

## 2019-09-08 ENCOUNTER — Other Ambulatory Visit: Payer: Self-pay | Admitting: Pulmonary Disease

## 2019-09-29 ENCOUNTER — Telehealth: Payer: Self-pay | Admitting: Family Medicine

## 2019-09-29 NOTE — Chronic Care Management (AMB) (Signed)
  Chronic Care Management   Note  09/29/2019 Name: Kaitlin Hamilton MRN: [id number] DOB: [birth year]  Kaitlin Hamilton is a 69 y.o. year old female who is a primary care patient of Bedsole, Amy E, MD. I reached out to Kaitlin Hamilton by phone today in response to a referral sent by Ms. Oda Kilts Sloan's PCP, [name], MD.   [name] was given information about Chronic Care Management services today including:  1. CCM service includes personalized support from designated clinical staff supervised by her physician, including individualized plan of care and coordination with other care providers 2. 24/7 contact phone numbers for assistance for urgent and routine care needs. 3. Service will only be billed when office clinical staff spend 20 minutes or more in a month to coordinate care. 4. Only one practitioner may furnish and bill the service in a calendar month. 5. The patient may stop CCM services at any time (effective at the end of the month) by phone call to the office staff.   Patient agreed to services and verbal consent obtained.   Follow up plan:   Raynicia Dukes [employer] Scheduler

## 2019-10-02 ENCOUNTER — Other Ambulatory Visit: Payer: Self-pay | Admitting: Family Medicine

## 2019-10-03 DIAGNOSIS — G4733 Obstructive sleep apnea (adult) (pediatric): Secondary | ICD-10-CM | POA: Diagnosis not present

## 2019-10-03 DIAGNOSIS — J8489 Other specified interstitial pulmonary diseases: Secondary | ICD-10-CM | POA: Diagnosis not present

## 2019-10-03 DIAGNOSIS — J84111 Idiopathic interstitial pneumonia, not otherwise specified: Secondary | ICD-10-CM | POA: Diagnosis not present

## 2019-10-08 IMAGING — DX DG ABDOMEN 1V
2 series · 2 of 2 positions shown · non-contrast
Comparison: CT abdomen and pelvis 06/28/2018.

CLINICAL DATA: Left lower quadrant pain. Question urinary tract
stone.

EXAM:
ABDOMEN - 1 VIEW

[abdomen kub (1 of 2)]
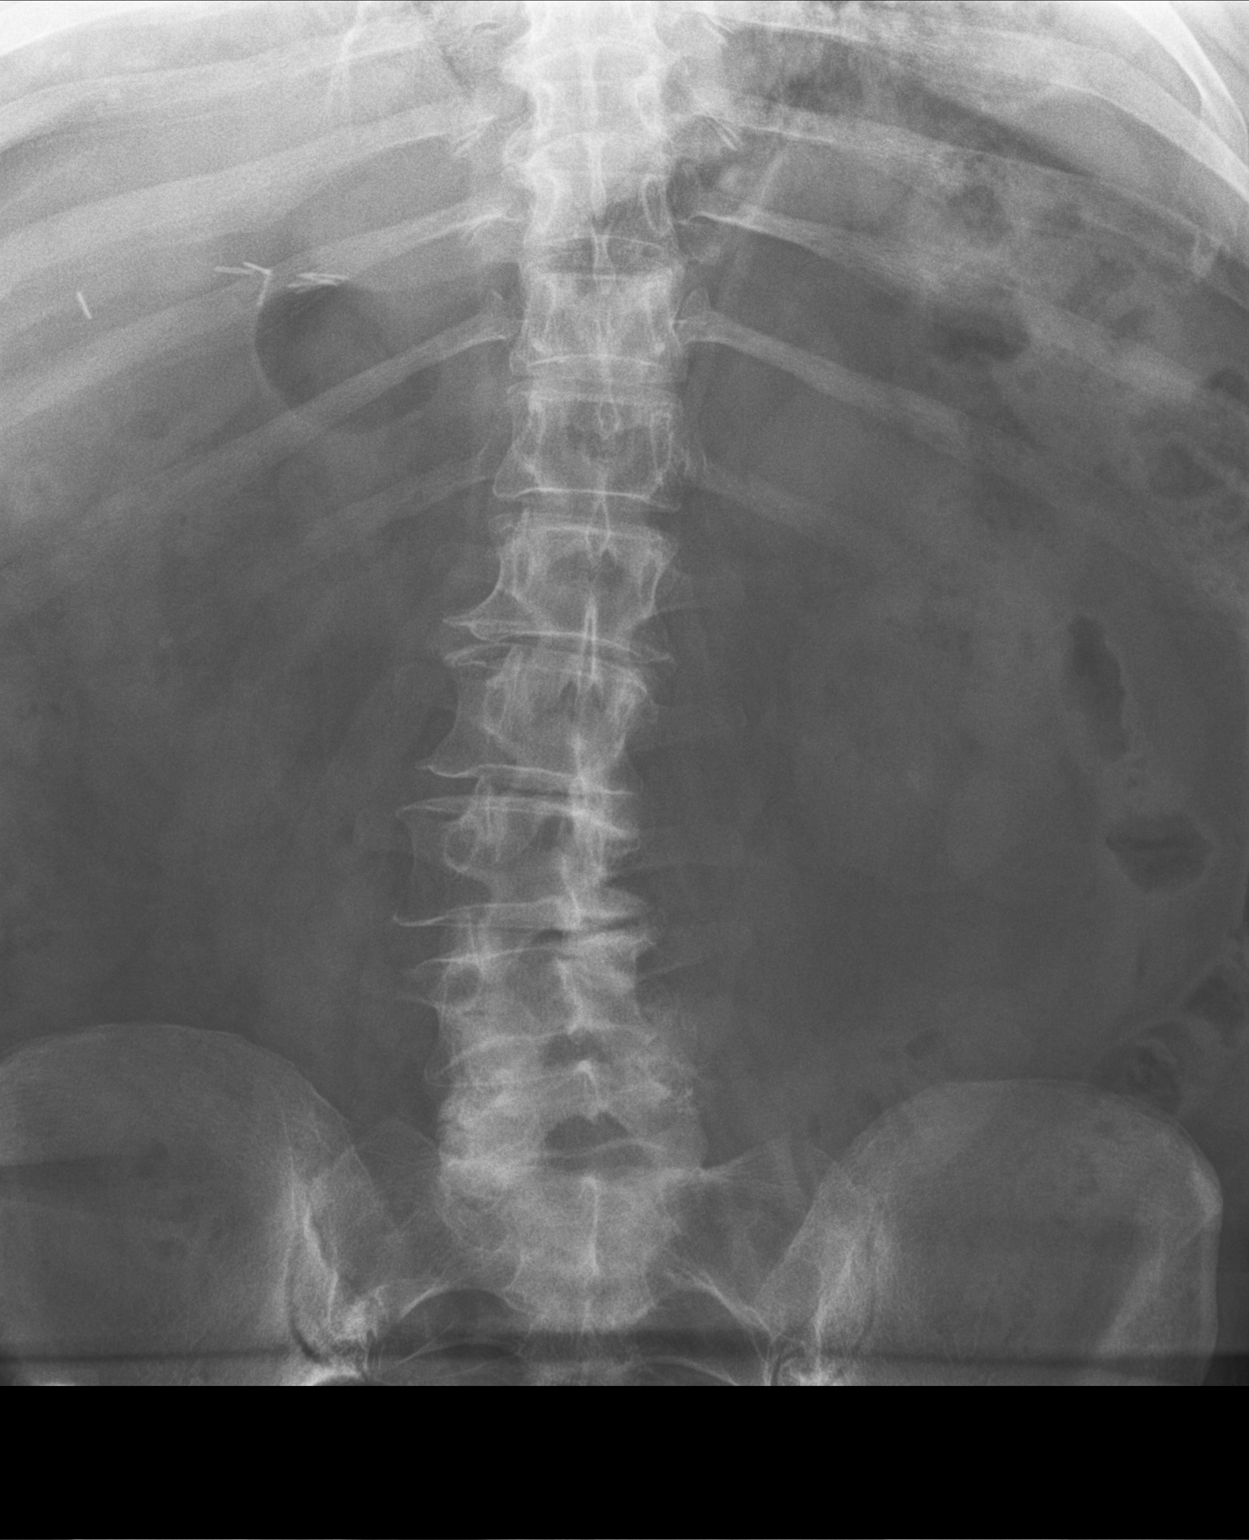

[abdomen kub (2 of 2)]
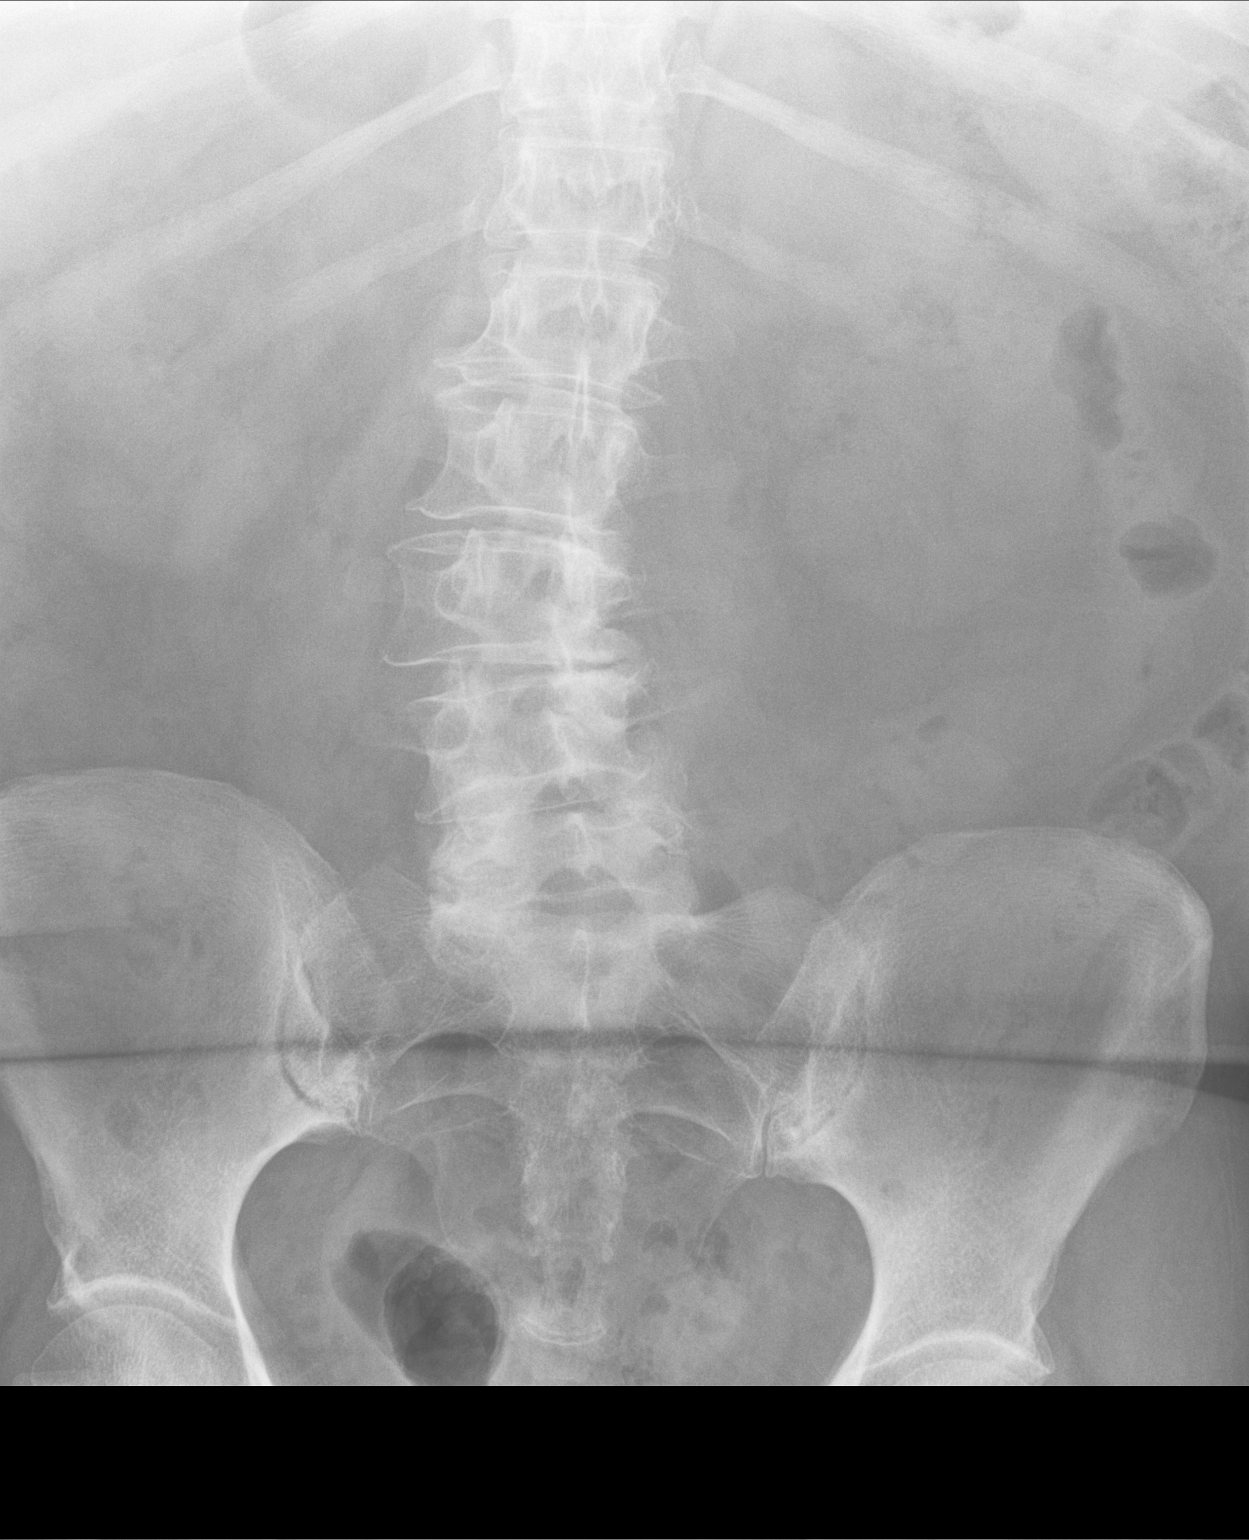

[2 of 2 positions shown; findings below may reference images not displayed]

FINDINGS: As seen on the comparison examination, single nonobstructing stones
are identified in each kidney. The larger is in the lower pole of
the left kidney measuring 0.6 cm. No evidence of ureteral stone.
Bowel gas pattern is unremarkable. Convex right lumbar scoliosis and
multilevel degenerative disease are noted.
IMPRESSION: Single nonobstructing stones in each kidney as seen on prior CT.

Convex right scoliosis and multilevel degenerative change.

## 2019-10-10 ENCOUNTER — Other Ambulatory Visit
Admission: RE | Admit: 2019-10-10 | Discharge: 2019-10-10 | Disposition: A | Discharge status: Home / Self Care | Payer: Medicare Other | Source: Ambulatory Visit | Attending: Pulmonary Disease | Admitting: Pulmonary Disease

## 2019-10-10 ENCOUNTER — Other Ambulatory Visit: Payer: Self-pay

## 2019-10-10 DIAGNOSIS — Z01812 Encounter for preprocedural laboratory examination: Secondary | ICD-10-CM | POA: Diagnosis not present

## 2019-10-10 DIAGNOSIS — Z20822 Contact with and (suspected) exposure to covid-19: Secondary | ICD-10-CM | POA: Insufficient documentation

## 2019-10-11 LAB — SARS CORONAVIRUS 2 (TAT 6-24 HRS): SARS Coronavirus 2: NEGATIVE

## 2019-10-12 ENCOUNTER — Encounter: Payer: Self-pay | Admitting: Primary Care

## 2019-10-12 ENCOUNTER — Ambulatory Visit: Payer: Medicare Other | Admitting: Primary Care

## 2019-10-12 ENCOUNTER — Other Ambulatory Visit: Payer: Self-pay

## 2019-10-12 ENCOUNTER — Ambulatory Visit (INDEPENDENT_AMBULATORY_CARE_PROVIDER_SITE_OTHER): Payer: Medicare Other | Admitting: Pulmonary Disease

## 2019-10-12 ENCOUNTER — Telehealth: Payer: Self-pay | Admitting: Primary Care

## 2019-10-12 DIAGNOSIS — G4733 Obstructive sleep apnea (adult) (pediatric): Secondary | ICD-10-CM | POA: Diagnosis not present

## 2019-10-12 DIAGNOSIS — J849 Interstitial pulmonary disease, unspecified: Secondary | ICD-10-CM

## 2019-10-12 DIAGNOSIS — J9611 Chronic respiratory failure with hypoxia: Secondary | ICD-10-CM

## 2019-10-12 DIAGNOSIS — J84112 Idiopathic pulmonary fibrosis: Secondary | ICD-10-CM | POA: Diagnosis not present

## 2019-10-12 LAB — PULMONARY FUNCTION TEST
DL/VA % pred: 110 %
DL/VA: 4.59 ml/min/mmHg/L
DLCO cor % pred: 44 %
DLCO cor: 8.76 ml/min/mmHg
DLCO unc % pred: 44 %
DLCO unc: 8.76 ml/min/mmHg
FEF 25-75 Pre: 2.15 L/sec
FEF2575-%Pred-Pre: 107 %
FEV1-%Pred-Pre: 44 %
FEV1-Pre: 1.05 L
FEV1FVC-%Pred-Pre: 122 %
FEV6-%Pred-Pre: 38 %
FEV6-Pre: 1.13 L
FEV6FVC-%Pred-Pre: 104 %
FVC-%Pred-Pre: 36 %
FVC-Pre: 1.13 L
Pre FEV1/FVC ratio: 93 %
Pre FEV6/FVC Ratio: 100 %

## 2019-10-12 MED ORDER — AZELASTINE HCL 0.1 % NA SOLN: 1.0000 | Freq: Two times a day (BID) | NASAL | 1 refills | Status: DC

## 2019-10-12 NOTE — Patient Instructions (Addendum)
PFTs today showed stable lung function   HRCT showed fibrosis was mildly progressed since 2016  Cough recommendations: - Sent in RX Astelin nasal spray - Take delsym cough syrup 27ml twice daily - Try chlorpheniramine (chlor-a tabs) take 4mg  tablet at bedtime for cough  - I will discuss increasing cellcept back with Dr. Vaughan Browner and get back to you in the next couple of days   Follow-up: - 3 months with Dr. Vaughan Browner or sooner if needed

## 2019-10-12 NOTE — Progress Notes (Addendum)
$'@Patient'G$  ID: Kaitlin Hamilton, female    DOB: 04-Oct-1950, 69 y.o.   MRN: [id number]  Chief Complaint  Patient presents with  . Follow-up    2 month follow up for UIP. Productive cough for the past 3 weeks. PFT today.     Referring provider: [name], MD  HPI: 69 year old female, never smoked. PMH significant for UIP, OSA, chronic respiratory failure. Patient of Dr. Vaughan Browner (previously followed by Dr. [name]), last seen on 08/15/19.   Previous [facility] pulmonary encounter: 08/15/19- Dr. Vaughan Browner  Surgical lung biopsy in December 2014 with findings showing UIP fibrosis.  There is strong suspicion for underlying connective tissue disease as she responds to prednisone.  Maintain on CellCept 500 mg twice daily which was increased to 1 g twice daily and 2019.  She has also been evaluated by Dr. [name] [name] at [facility]. Esbriet started in 2018 for progressive pulmonary fibrosis.  Continues on Esbriet, CellCept 1 g twice daily.  She is off prednisone at present States that breathing is doing well. She has had 2 episodes of diverticulitis in 2019 and 2020, E. coli UTI in 2020 and Campylobacter colitis in December 2020.  Patient is here with daughter and question if CellCept is causing her to have recurrent nonpulmonary infections.  10/12/2019  Patient presents today for 2 month follow-up. Accompanied by female family friend. During last visit in January her Cellcept was decreased to '500mg'$  twice daily. Since cellcept was decreased she reports increased cough since mid-February. Her cough as disrupted her sleep and negatively impacted her life. Reports compliance with CPAP, airview download shows 80%. No issues with mask fit or pressure setting.   Data Reviewed: Imaging: 11/2012 CT chest >>  centrilobular nodules, interlobular septal thickening worse in bases and periphery R lung > L; some GGO in bases and periphery as well, some bronchiectasis in the bases R > L; findings suggestive of fibrosis but not UIP;  question hypersensitivity pneumonitis given centrilobular nodules; also question aspiration  04/2013 Barium swallow> abnormal esophageal motility, GERD, hiatal hernia  04/2013 CT chest ([name])> findings suggestive of but not diagnostic of NSIP, small pulmonary nodule  February 2016 CT chest high resolution> slight progression in reticular abnormalities consistent with pulmonary fibrosis, uip  High-resolution CT chest 08/04/2019-basilar predominant fibrotic interstitial lung disease with mild honeycombing.  Mild progressions 2016.  UIP pattern  PFTs: 04/28/2018 FVC 1.1 [35%], FEV1 1.02 [42%], F/F 92, DLCO 8.88 [36%] Severe restriction, diffusion impairment. With worsening compared to prior years.  10/12/2019 FVC 1.1 (35%), FEV1 1.02 (42%), ratio 93, DLCOcor 8.76 (44%)  6 minute walk 10/19/2012 walked 500 feet in office on room air oxygenation did not drop below 90% 04/2013 6MW RA > 1100 feet, HR peak 109, O2 sat Nadir 87% 11/2013 6MW 1364 feet, O2 saturation nadir 78% 08/21/14 6MW 1400 feet 90% on 8L  Labs: 03/2013 ANA, ANCA, Anti-Jo-1, ESR, RF, SCL-70, anti-centromere, SSA/SSB, all negative; CRP 0.6;   Allergies  Allergen Reactions  . Adhesive [Tape]     Redness, bruising with any adhesives- bandaids, patches, etc.   . Atovaquone Itching  . Codeine Hives and Swelling  . Demerol [Meperidine] Hives and Swelling  . Hydrocodone Nausea Only  . Trazodone And Nefazodone Itching  . Sulfa Antibiotics Rash    Immunization History  Administered Date(s) Administered  . Influenza Split 05/02/2005, 11/28/2014  . Influenza Whole 04/20/2012  . Influenza, High Dose Seasonal PF 04/16/2018  . Influenza,inj,Quad PF,6+ Mos 05/17/2013, 04/04/2014, 10/06/2014, 05/11/2015, 10/19/2015, 03/12/2016,  04/30/2017, 10/14/2017, 03/16/2019  . Influenza-Unspecified 04/20/2013  . Pneumococcal Conjugate-13 04/04/2014  . Pneumococcal Polysaccharide-23 04/20/2013, 04/16/2018  . Tdap 07/21/2009    Past  Medical History:  Diagnosis Date  . Allergic rhinitis    uses Flonase daily  . Anesthesia complication    Per pt/ past endoscopy in 2015, the anesthetic spray in back throat caused her to have breathing problems  . Anxiety   . Bronchitis   . Chicken pox   . Constipation    takes Fiber daily  . Depression   . Diverticulosis   . Dysphagia   . GERD (gastroesophageal reflux disease)    takes Protonix daily  . H/O hiatal hernia   . Hemorrhoids   . History of bronchitis    2013  . History of colon polyps   . History of kidney stones   . Hyperlipidemia    lost 43 pounds and no meds required at present  . Hypertension    takes Verapamil and HCTZ daily  . Hypokalemia   . Hypothyroidism (acquired)    takes Synthroid daily  . Insomnia    doesn't take any meds for this  . Joint swelling    left thumb  . Migraines    last one about a month ago  . Non-insulin dependent type 2 diabetes mellitus ([place])    takes Glipizide daily  . OA (osteoarthritis)    left knee  . Oxygen deficiency   . Oxygen dependent    Pt is on continuous O2 at 3 liters!  . Pneumonia    last time in 2006  . PONV (postoperative nausea and vomiting)   . Shortness of breath    with exertion;takes Singulair daily as well as Flonase  . Urinary frequency     Tobacco History: Social History   Tobacco Use  Smoking Status Never Smoker  Smokeless Tobacco Never Used   Counseling given: Not Answered   Outpatient Medications Prior to Visit  Medication Sig Dispense Refill  . acetaminophen (TYLENOL) 500 MG tablet Take 1,000 mg by mouth every 6 (six) hours as needed for moderate pain.    . dapagliflozin propanediol (FARXIGA) 10 MG TABS tablet Take 10 mg by mouth daily. 30 tablet 11  . diclofenac (VOLTAREN) 75 MG EC tablet Take 1 tablet (75 mg total) by mouth 2 (two) times daily. 30 tablet 0  . ESBRIET 801 MG TABS TAKE 1 TABLET (801MG) BY  MOUTH THREE TIMES DAILY  WITH FOOD 270 tablet 6  . FIBER PO Take 1 tablet  by mouth daily.    . fluticasone (FLONASE) 50 MCG/ACT nasal spray Place 1 spray into both nostrils daily.    . hydrochlorothiazide (HYDRODIURIL) 12.5 MG tablet TAKE 1 TABLET DAILY ALONG WITH ONE OF THE LOSARTAN 50 MG TABLETS 90 tablet 3  . levothyroxine (SYNTHROID) 125 MCG tablet TAKE 1 TABLET BY MOUTH  DAILY BEFORE BREAKFAST 90 tablet 3  . losartan (COZAAR) 50 MG tablet TAKE 1 TABLET DAILY ALONG WITH 1 OF THE HCTZ 12.5MG TABLETS 90 tablet 3  . losartan-hydrochlorothiazide (HYZAAR) 50-12.5 MG tablet Take 1 tablet by mouth daily. 90 tablet 1  . metFORMIN (GLUCOPHAGE-XR) 500 MG 24 hr tablet Take 3 tablets (1,500 mg total) by mouth daily with breakfast. 270 tablet 3  . montelukast (SINGULAIR) 10 MG tablet Take 1 tablet (10 mg total) by mouth at bedtime. 90 tablet 3  . NON FORMULARY Place 4 L into the nose daily. 10 Liters with exertion    . nystatin  cream (MYCOSTATIN) Apply 1 application topically 3 (three) times daily. 30 g 0  . ONETOUCH ULTRA test strip CHECK BLOOD SUGAR DAILY. 50 strip 11  . pantoprazole (PROTONIX) 40 MG tablet TAKE 1 TABLET BY MOUTH  TWICE DAILY 180 tablet 3  . pioglitazone (ACTOS) 45 MG tablet Take 1 tablet (45 mg total) by mouth daily. 90 tablet 3  . pravastatin (PRAVACHOL) 40 MG tablet Take 1qd 90 tablet 3  . verapamil (CALAN-SR) 180 MG CR tablet TAKE 1 TABLET BY MOUTH  DAILY 90 tablet 3   No facility-administered medications prior to visit.   Review of Systems  Review of Systems  Constitutional: Negative.   Respiratory: Positive for cough. Negative for choking, chest tightness, shortness of breath, wheezing and stridor.   Cardiovascular: Negative.     Physical Exam  BP 120/70 (BP Location: Left Arm, Patient Position: Sitting, Cuff Size: Large)   Pulse 85   Temp (!) 97.4 F (36.3 C) (Temporal)   Ht 5' 4.25" (1.632 m)   Wt 230 lb (104.3 kg)   SpO2 97% Comment: on 4L  BMI 39.17 kg/m  Physical Exam Constitutional:      Appearance: Normal appearance.  HENT:      Head: Normocephalic and atraumatic.     Mouth/Throat:     Mouth: Mucous membranes are moist.     Pharynx: Oropharynx is clear.  Cardiovascular:     Rate and Rhythm: Normal rate and regular rhythm.  Pulmonary:     Effort: Pulmonary effort is normal.     Breath sounds: Rales present.     Comments: Rales bases, on oxygen  Musculoskeletal:        General: Normal range of motion.  Skin:    General: Skin is warm and dry.  Neurological:     General: No focal deficit present.     Mental Status: She is alert and oriented to person, place, and time. Mental status is at baseline.  Psychiatric:        Mood and Affect: Mood normal.        Behavior: Behavior normal.        Thought Content: Thought content normal.        Judgment: Judgment normal.      Lab Results:  CBC    Component Value Date/Time   WBC 10.5 07/17/2019 1405   RBC 4.80 07/17/2019 1405   HGB 13.9 07/17/2019 1405   HGB 10.9 (L) 07/26/2014 1229   HCT 42.0 07/17/2019 1405   HCT 35.6 07/26/2014 1229   PLT 225 07/17/2019 1405   PLT 354 07/26/2014 1229   MCV 87.5 07/17/2019 1405   MCV 101 (H) 07/26/2014 1229   MCH 29.0 07/17/2019 1405   MCHC 33.1 07/17/2019 1405   RDW 13.4 07/17/2019 1405   RDW 17.4 (H) 07/26/2014 1229   LYMPHSABS 1.5 07/17/2019 1405   LYMPHSABS 2.7 07/26/2014 1229   MONOABS 1.0 07/17/2019 1405   MONOABS 0.7 07/26/2014 1229   EOSABS 0.1 07/17/2019 1405   EOSABS 0.2 07/26/2014 1229   BASOSABS 0.0 07/17/2019 1405   BASOSABS 0.1 07/26/2014 1229    BMET    Component Value Date/Time   NA 136 07/17/2019 1405   NA 138 07/26/2014 1229   K 2.8 (L) 07/17/2019 1405   K 3.6 07/26/2014 1229   CL 93 (L) 07/17/2019 1405   CL 100 07/26/2014 1229   CO2 26 07/17/2019 1405   CO2 28 07/26/2014 1229   GLUCOSE 143 (H) 07/17/2019 1405  GLUCOSE 164 (H) 07/26/2014 1229   BUN 19 07/17/2019 1405   BUN 24 (H) 07/26/2014 1229   CREATININE 0.78 07/17/2019 1405   CREATININE 1.07 07/26/2014 1229   CALCIUM 9.6  07/17/2019 1405   CALCIUM 9.5 07/26/2014 1229   GFRNONAA >60 07/17/2019 1405   GFRNONAA 55 (L) 07/26/2014 1229   GFRAA >60 07/17/2019 1405   GFRAA >60 07/26/2014 1229    BNP No results found for: BNP  ProBNP No results found for: PROBNP  Imaging: No results found.   Assessment & Plan:   UIP (usual interstitial pneumonitis) (HCC) - INCREASED cough after cellcept was decreased to 560m twice daily in January 2021 - 10/12/2019 PFTs - FVC 1.1 (35%), FEV1 1.02 (42%), ratio 93, DLCOcor 8.76 (44%)/ Severe restriction, diffusion impairment. Stable compared to 2019 - HRCT 1/14/2021showed mild progression since 2016. UIP pattern - Will discuss with Dr. MVaughan Brownerabout increasing cellcept back to 1gm BID   OSA (obstructive sleep apnea) - Compliant with CPAP, 73% >4 hours - Pressure 5-10cm h20 - AHI 0.4 - No changes today   EMartyn Ehrich NP 10/12/2019

## 2019-10-12 NOTE — Telephone Encounter (Signed)
-----   Message from [name], MD sent at 10/12/2019 12:24 PM EDT ----- Regarding: RE: Cellcept dose Yes. That is fine. Thanks ----- Message ----- From: Martyn Ehrich, NP Sent: 10/12/2019  11:28 AM EDT To: [name], MD Subject: Cellcept dose                                  Patient of yours with UIP. You decreased cellcept to 500mg  in January because she was getting some intestinal infections and UTIs. She reports increased cough in mid February. Feels this is due to medication decrease, it is negatively impacting her life. She would like to go back to 1,000mg  twice a day. Her PFTs were stable today. Is this ok with you??  -[employer]

## 2019-10-12 NOTE — Assessment & Plan Note (Addendum)
-   INCREASED cough after cellcept was decreased to 500mg  twice daily in January 2021 - 10/12/2019 PFTs - FVC 1.1 (35%), FEV1 1.02 (42%), ratio 93, DLCOcor 8.76 (44%)/ Severe restriction, diffusion impairment. Stable compared to 2019 - HRCT 1/14/2021showed mild progression since 2016. UIP pattern - Will discuss with Dr. Vaughan Browner about increasing cellcept back to 1gm BID

## 2019-10-12 NOTE — Progress Notes (Addendum)
Spiro/DLCO performed today. 

## 2019-10-12 NOTE — Telephone Encounter (Signed)
Called patient, left message. Please let her know ok to resume Cellcept 1gm twice daily. Ask if she needs new prescription. Thanks

## 2019-10-12 NOTE — Assessment & Plan Note (Signed)
-   Compliant with CPAP, 73% >4 hours - Pressure 5-10cm h20 - AHI 0.4 - No changes today

## 2019-10-12 NOTE — Telephone Encounter (Signed)
Will hold in triage until she calls back.

## 2019-10-13 NOTE — Telephone Encounter (Signed)
PT called back, please return call 

## 2019-10-13 NOTE — Telephone Encounter (Signed)
Spoke with patient. She verbalized understanding of increasing the Cellcept. She stated that she has plenty of Cellcept at home. Advised her to call us back if her symptoms did not improve.   Nothing further needed at time of call.

## 2019-10-17 ENCOUNTER — Ambulatory Visit: Payer: Medicare Other

## 2019-10-17 ENCOUNTER — Telehealth: Payer: Self-pay

## 2019-10-17 ENCOUNTER — Other Ambulatory Visit: Payer: Self-pay

## 2019-10-17 DIAGNOSIS — E785 Hyperlipidemia, unspecified: Secondary | ICD-10-CM

## 2019-10-17 DIAGNOSIS — E1159 Type 2 diabetes mellitus with other circulatory complications: Secondary | ICD-10-CM

## 2019-10-17 DIAGNOSIS — K219 Gastro-esophageal reflux disease without esophagitis: Secondary | ICD-10-CM

## 2019-10-17 DIAGNOSIS — J309 Allergic rhinitis, unspecified: Secondary | ICD-10-CM

## 2019-10-17 DIAGNOSIS — G43009 Migraine without aura, not intractable, without status migrainosus: Secondary | ICD-10-CM

## 2019-10-17 DIAGNOSIS — E039 Hypothyroidism, unspecified: Secondary | ICD-10-CM

## 2019-10-17 DIAGNOSIS — E118 Type 2 diabetes mellitus with unspecified complications: Secondary | ICD-10-CM

## 2019-10-17 DIAGNOSIS — E1169 Type 2 diabetes mellitus with other specified complication: Secondary | ICD-10-CM

## 2019-10-17 DIAGNOSIS — G4733 Obstructive sleep apnea (adult) (pediatric): Secondary | ICD-10-CM

## 2019-10-17 DIAGNOSIS — J9611 Chronic respiratory failure with hypoxia: Secondary | ICD-10-CM

## 2019-10-17 NOTE — Patient Instructions (Addendum)
October 17, 2019  Dear Kaitlin Hamilton,  It was a pleasure meeting you during our initial appointment on October 17, 2019. Below is a summary of the goals we discussed and components of chronic care management. Please contact me anytime with questions or concerns. Let me know if you are interested in our pharmacy delivery services.   Visit Information  Goals    . Increase water intake     Starting 07/08/2018, I will continue to drink at least 6-8 glasses of water daily.     . Patient Stated     07/12/2019, I will continue to work on losing more weight.      . Pharmacy Care Plan     CARE PLAN ENTRY  Current Barriers:  . Chronic Disease Management support, education, and care coordination needs related to hypertension, migraine, sleep apnea, allergic rhinitis, chronic respiratory failure, GERD, hyperlipidemia, diabetes, MDD, insomnia  Pharmacist Clinical Goal(s):  Marland Kitchen Improve cholesterol through dietary changes. Continue pravastatin 40 mg at bedtime. See cholesterol handout for heart healthy diet recommendations.  . Remain up to date on vaccinations. Recommend updating tetanus booster this year (last Tdap 07/2009) . Maintain diabetes control with A1c < 7% and prevent hypoglycemia (blood glucose < 70 mg/dL). Please call with any further episodes of low blood glucose.  Interventions: . Comprehensive medication review performed  Patient Self Care Activities:  . Self administers medications as prescribed . Self-monitors blood pressure  Initial goal documentation      Kaitlin Hamilton was given information about Chronic Care Management services today including:  1. CCM service includes personalized support from designated clinical staff supervised by her physician, including individualized plan of care and coordination with other care providers 2. 24/7 contact phone numbers for assistance for urgent and routine care needs. 3. Standard insurance, coinsurance, copays and deductibles apply for chronic  care management only during months in which we provide at least 20 minutes of these services. Most insurances cover these services at 100%, however patients may be responsible for any copay, coinsurance and/or deductible if applicable. This service may help you avoid the need for more expensive face-to-face services. 4. Only one practitioner may furnish and bill the service in a calendar month. 5. The patient may stop CCM services at any time (effective at the end of the month) by phone call to the office staff.  Patient agreed to services and verbal consent obtained.   The patient verbalized understanding of instructions provided today and agreed to receive a mailed copy of patient instruction and/or educational materials. Telephone follow up appointment with pharmacy team member scheduled for: Dec 06, 2019 at 9:30 AM to review cholesterol dietary changes  Kaitlin Hamilton, PharmD Clinical Pharmacist [facility] [phone]   Cholesterol Content in Foods Cholesterol is a waxy, fat-like substance that helps to carry fat in the blood. The body needs cholesterol in small amounts, but too much cholesterol can cause damage to the arteries and heart. Most people should eat less than 200 milligrams (mg) of cholesterol a day. Foods with cholesterol  Cholesterol is found in animal-based foods, such as meat, seafood, and dairy. Generally, low-fat dairy and lean meats have less cholesterol than full-fat dairy and fatty meats. The milligrams of cholesterol per serving (mg per serving) of common cholesterol-containing foods are listed below. Meat and other proteins  Egg -- one large whole egg has 186 mg.  Veal shank -- 4 oz has 141 mg.  Lean ground [place] (93% lean) --  4 oz has 118 mg.  Fat-trimmed lamb loin -- 4 oz has 106 mg.  Lean ground beef (90% lean) -- 4 oz has 100 mg.  Lobster -- 3.5 oz has 90 mg.  Pork loin chops -- 4 oz has 86 mg.  Canned salmon -- 3.5 oz  has 83 mg.  Fat-trimmed beef top loin -- 4 oz has 78 mg.  Frankfurter -- 1 frank (3.5 oz) has 77 mg.  Crab -- 3.5 oz has 71 mg.  Roasted chicken without skin, white meat -- 4 oz has 66 mg.  Light bologna -- 2 oz has 45 mg.  Deli-cut [place] -- 2 oz has 31 mg.  Canned tuna -- 3.5 oz has 31 mg.  Berniece Salines -- 1 oz has 29 mg.  Oysters and mussels (raw) -- 3.5 oz has 25 mg.  Mackerel -- 1 oz has 22 mg.  Trout -- 1 oz has 20 mg.  Pork sausage -- 1 link (1 oz) has 17 mg.  Salmon -- 1 oz has 16 mg.  Tilapia -- 1 oz has 14 mg. Dairy  Soft-serve ice cream --  cup (4 oz) has 103 mg.  Whole-milk yogurt -- 1 cup (8 oz) has 29 mg.  Cheddar cheese -- 1 oz has 28 mg.  American cheese -- 1 oz has 28 mg.  Whole milk -- 1 cup (8 oz) has 23 mg.  2% milk -- 1 cup (8 oz) has 18 mg.  Cream cheese -- 1 tablespoon (Tbsp) has 15 mg.  Cottage cheese --  cup (4 oz) has 14 mg.  Low-fat (1%) milk -- 1 cup (8 oz) has 10 mg.  Sour cream -- 1 Tbsp has 8.5 mg.  Low-fat yogurt -- 1 cup (8 oz) has 8 mg.  Nonfat Greek yogurt -- 1 cup (8 oz) has 7 mg.  Half-and-half cream -- 1 Tbsp has 5 mg. Fats and oils  Cod liver oil -- 1 tablespoon (Tbsp) has 82 mg.  Butter -- 1 Tbsp has 15 mg.  Lard -- 1 Tbsp has 14 mg.  Bacon grease -- 1 Tbsp has 14 mg.  Mayonnaise -- 1 Tbsp has 5-10 mg.  Margarine -- 1 Tbsp has 3-10 mg. Exact amounts of cholesterol in these foods may vary depending on specific ingredients and brands. Foods without cholesterol Most plant-based foods do not have cholesterol unless you combine them with a food that has cholesterol. Foods without cholesterol include:  Grains and cereals.  Vegetables.  Fruits.  Vegetable oils, such as olive, canola, and sunflower oil.  Legumes, such as peas, beans, and lentils.  Nuts and seeds.  Egg whites. Summary  The body needs cholesterol in small amounts, but too much cholesterol can cause damage to the arteries and  heart.  Most people should eat less than 200 milligrams (mg) of cholesterol a day. This information is not intended to replace advice given to you by your health care provider. Make sure you discuss any questions you have with your health care provider. Document Revised: 06/19/2017 Document Reviewed: 03/03/2017 Elsevier Patient Education  [place].

## 2019-10-17 NOTE — Telephone Encounter (Signed)
I would like to request a referral for Kaitlin Hamilton to chronic care management pharmacy services for the following conditions:   Essential hypertension, benign  [I10]  GERD [K21.9]  Debbora Dus, PharmD Clinical Pharmacist [facility] [phone]

## 2019-10-17 NOTE — Chronic Care Management (AMB) (Signed)
Chronic Care Management Pharmacy  Name: Kaitlin Hamilton  MRN: [id number] DOB: [birth year]  Chief Complaint/ HPI  Kaitlin Hamilton,  69 y.o., female presents for their Initial CCM visit with the clinical pharmacist via telephone.  PCP : Kaitlin Sanders, MD  Their chronic conditions include: hypertension, sleep apnea, allergic rhinitis, chronic respiratory failure, GERD, hyperlipidemia, diabetes, migraine, depression, insomnia   Patient concerns: no concerns, cough is improving; denies concern with migraines or mood - previously on medication for sleep but it made her drowsy next day, sleeps with CPAP  Office Visits:   07/26/19: Kaitlin Hamilton - stop Invokana, start [place] due to insurance, cholesterol elevated   Consult Visit:  10/12/19: UIP - increase Cellcept to 1 gm BID due to cough, rx astelin, delsym cough 5 mL BID, and chlorpheniramine 4 mg qhs   Allergies  Allergen Reactions  . Adhesive [Tape]     Redness, bruising with any adhesives- bandaids, patches, etc.   . Atovaquone Itching  . Codeine Hives and Swelling  . Demerol [Meperidine] Hives and Swelling  . Hydrocodone Nausea Only  . Trazodone And Nefazodone Itching  . Sulfa Antibiotics Rash   Medications: Outpatient Encounter Medications as of 10/17/2019  Medication Sig  . acetaminophen (TYLENOL) 500 MG tablet Take 1,000 mg by mouth every 6 (six) hours as needed for moderate pain.  Marland Kitchen azelastine (ASTELIN) 0.1 % nasal spray Place 1 spray into both nostrils 2 (two) times daily. Use in each nostril as directed  . dapagliflozin propanediol (FARXIGA) 10 MG TABS tablet Take 10 mg by mouth daily.  . diclofenac (VOLTAREN) 75 MG EC tablet Take 1 tablet (75 mg total) by mouth 2 (two) times daily.  . ESBRIET 801 MG TABS TAKE 1 TABLET (801MG ) BY  MOUTH THREE TIMES DAILY  WITH FOOD  . FIBER PO Take 1 tablet by mouth daily.  . fluticasone (FLONASE) 50 MCG/ACT nasal spray Place 1 spray into both nostrils daily.  . hydrochlorothiazide (HYDRODIURIL)  12.5 MG tablet TAKE 1 TABLET DAILY ALONG WITH ONE OF THE LOSARTAN 50 MG TABLETS  . levothyroxine (SYNTHROID) 125 MCG tablet TAKE 1 TABLET BY MOUTH  DAILY BEFORE BREAKFAST  . losartan (COZAAR) 50 MG tablet TAKE 1 TABLET DAILY ALONG WITH 1 OF THE HCTZ 12.5MG  TABLETS  . losartan-hydrochlorothiazide (HYZAAR) 50-12.5 MG tablet Take 1 tablet by mouth daily.  . metFORMIN (GLUCOPHAGE-XR) 500 MG 24 hr tablet Take 3 tablets (1,500 mg total) by mouth daily with breakfast.  . montelukast (SINGULAIR) 10 MG tablet Take 1 tablet (10 mg total) by mouth at bedtime.  . mycophenolate (CELLCEPT) 500 MG tablet Take 1,000 mg by mouth 2 (two) times daily.  . NON FORMULARY Place 4 L into the nose daily. 10 Liters with exertion  . nystatin cream (MYCOSTATIN) Apply 1 application topically 3 (three) times daily.  Kaitlin Hamilton ULTRA test strip CHECK BLOOD SUGAR DAILY.  . pantoprazole (PROTONIX) 40 MG tablet TAKE 1 TABLET BY MOUTH  TWICE DAILY  . pioglitazone (ACTOS) 45 MG tablet Take 1 tablet (45 mg total) by mouth daily.  . pravastatin (PRAVACHOL) 40 MG tablet Take 1qd  . verapamil (CALAN-SR) 180 MG CR tablet TAKE 1 TABLET BY MOUTH  DAILY   No facility-administered encounter medications on file as of 10/17/2019.   Current Diagnosis/Assessment: Goals    . Increase water intake     Starting 07/08/2018, I will continue to drink at least 6-8 glasses of water daily.     . Patient Stated  07/12/2019, I will continue to work on losing more weight.      . Pharmacy Care Plan     CARE PLAN ENTRY  Current Barriers:  . Chronic Disease Management support, education, and care coordination needs related to hypertension, migraine, sleep apnea, allergic rhinitis, chronic respiratory failure, GERD, hyperlipidemia, diabetes, MDD, insomnia  Pharmacist Clinical Goal(s):  Marland Kitchen Improve cholesterol through dietary changes. Continue pravastatin 40 mg at bedtime. See cholesterol handout for heart healthy diet recommendations.  . Remain up  to date on vaccinations. Recommend updating tetanus booster this year (last Tdap 07/2009) . Maintain diabetes control with A1c < 7% and prevent hypoglycemia (blood glucose < 70 mg/dL). Please call with any further episodes of low blood glucose.  Interventions: . Comprehensive medication review performed  Patient Self Care Activities:  . Self administers medications as prescribed . Self-monitors blood pressure  Initial goal documentation      Diabetes   Recent Relevant Labs: Lab Results  Component Value Date/Time   HGBA1C 6.7 (H) 07/12/2019 08:58 AM   HGBA1C 6.6 (H) 02/01/2019 09:30 AM   MICROALBUR 5.1 (H) 11/08/2013 08:58 AM    Checking BG: Daily Recent FBG Readings: 115-140, highest 168 Hypoglycemia: a couple of weeks ago: 50 mg/dL, wasn't feeling well and didn't each much that day; keeps peppermint candy nearby, none since and never happened before   Patient has failed these meds in past: Invokana (insurance)  Patient is currently controlled on the following medications:   Farxiga 10 mg - 1 tablet daily  Pioglitazone 45 mg - 1 tablet daily   Metformin 500 mg - 3 tablets daily with breakfast  Last diabetic eye exam:  Lab Results  Component Value Date/Time   HMDIABEYEEXA No Retinopathy 02/09/2018 12:00 AM    Last diabetic foot exam:  Lab Results  Component Value Date/Time   HMDIABFOOTEX done 07/25/2019 12:00 AM    We discussed: doing well with Wilder Glade, takes all three meds in the morning; weight is down from 267 to 226 lbs (over the past year)  Exercise: stays busy, walks around the house   Diet: tries to limit portions, only eats 1-2 meals a day (2PM or later), banana or fruit for breakfast, fruit as snack, tries to drink a lot of water, poor appetite  Plan: Continue current medications; Call if having any further episodes of hypoglycemia.   Hypertension   CMP Latest Ref Rng & Units 07/17/2019 07/12/2019 02/01/2019  Glucose 70 - 99 mg/dL 143(H) 160(H) 149(H)    BUN 8 - 23 mg/dL 19 14 10   Creatinine 0.44 - 1.00 mg/dL 0.78 0.77 0.69  Sodium 135 - 145 mmol/L 136 136 137  Potassium 3.5 - 5.1 mmol/L 2.8(L) 4.2 4.2  Chloride 98 - 111 mmol/L 93(L) 96 97  CO2 22 - 32 mmol/L 26 30 31   Calcium 8.9 - 10.3 mg/dL 9.6 10.3 10.1  Total Protein 6.5 - 8.1 g/dL 8.2(H) 7.7 7.6  Total Bilirubin 0.3 - 1.2 mg/dL 0.7 0.4 0.4  Alkaline Phos 38 - 126 U/L 71 71 75  AST 15 - 41 U/L 17 13 12   ALT 0 - 44 U/L 12 8 10    Office blood pressures are: BP Readings from Last 3 Encounters:  10/12/19 120/70  08/15/19 (!) 116/58  07/26/19 110/60   BP goal < 130/80 mmHg Patient has failed these meds in the past: none reported Patient checks BP at home: when feeling symptomatic, not often Patient home BP readings are ranging: none reported, good in clinic  Patient is currently controlled on the following medications:   Verapamil 180 mg CR - 1 tablet daily  Losartan/HCTZ 50-12.5 mg - 1 tablet daily (not taking)  Losartan 50 mg - 1 tablet with 12.5 mg HCTZ   Hydrochlorothiazide 12.5 mg - 1 tablet daily  We discussed: denies dizziness, lightheadedness  Plan: Continue current medications  Hyperlipidemia   Lipid Panel     Component Value Date/Time   CHOL 234 (H) 07/12/2019 0858   TRIG 124.0 07/12/2019 0858   HDL 75.30 07/12/2019 0858   CHOLHDL 3 07/12/2019 0858   VLDL 24.8 07/12/2019 0858   LDLCALC 134 (H) 07/12/2019 0858   LDLDIRECT 104.0 09/04/2017 0956    LDL goal < 100 The 10-year ASCVD risk score Mikey Bussing DC Jr., et al., 2013) is: 16.3%   Values used to calculate the score:     Age: 85 years     Sex: Female     Is Non-Hispanic African American: No     Diabetic: Yes     Tobacco smoker: No     Systolic Blood Pressure: 123456 mmHg     Is BP treated: Yes     HDL Cholesterol: 75.3 mg/dL     Total Cholesterol: 234 mg/dL   Patient has failed these meds in past: atorvastatin (muscle pain) Patient is currently uncontrolled on the following medications:    Pravastatin 40 mg - 1 tablet daily (evening)   We discussed: handout of DASH diet; limiting dietary of intake of beef liver and red meats (eats fairly often now)  Plan: Continue current medications; Focus on dietary changes and follow up 3 months to review  UIP/Pulmonary Fibrosis    Followed by Dr. Vaughan Hamilton  Patient has failed these meds in past: none Patient is currently controlled on the following medications:   Mycophenolate 500 mg - take 2 tablets BID  Esbriet 801 mg - 1 tablet TID with food (morning, 2 PM, and bed)   Supplement oxygen daily   We discussed: confirmed daily adherence  Plan: Continue current medications   Allergic Rhinitis   Patient has failed these meds in past: none Patient is currently controlled on the following medications:   Montelukast 10 mg - 1 tablet daily at bedtime  Flonase 50 mcg - 1 spray in each nostril daily  Azelastine 0.1% nasal spray - 1 spray in each nostril BID   We discussed: started Azelastine a couple days ago, not taking Delsym for cough due to concern for BP and jitters; also started chlorpheniramine 4 mg qhs. Cough has improved some. Plans to take chlorpheniramine and azelastine short term until cough resolves.  Plan: Continue current medications  GERD   Patient has failed these meds in past: Zantac - not effective  Patient is currently controlled on the following medications:   Pantoprazole 40 mg - 1 tablet BID  We discussed: works well, denies any acid reflux symptoms, reports taking for at least 4 years   Plan: Continue current medications  Hypothyroidism   TSH  Date Value Ref Range Status  07/12/2019 1.04 0.35 - 4.50 uIU/mL Final    Patient has failed these meds in past: none Patient is currently controlled on the following medications:   Levothyroxine 125 mcg - 1 tablet daily before breakfast  We discussed: takes with other medications in the morning, but TSH stable so no changes indicated  Plan: Continue  current medications  Vaccines   Reviewed and discussed patient's vaccination history.    Immunization History  Administered Date(s)  Administered  . Influenza Split 05/02/2005, 11/28/2014  . Influenza Whole 04/20/2012  . Influenza, High Dose Seasonal PF 04/16/2018  . Influenza,inj,Quad PF,6+ Mos 05/17/2013, 04/04/2014, 10/06/2014, 05/11/2015, 10/19/2015, 03/12/2016, 04/30/2017, 10/14/2017, 03/16/2019  . Influenza-Unspecified 04/20/2013  . Pneumococcal Conjugate-13 04/04/2014  . Pneumococcal Polysaccharide-23 04/20/2013, 04/16/2018  . Tdap 07/21/2009    Plan: Reports receiving both doses of COVID-19 vaccine. Per patient, pulmonologist does not recommend she receive the shingles vaccine. Recommend updating tetanus booster this year.   Medication Management  Misc: Nystatin cream - TID (for breakouts under breast, arm); diclofenac 75 mg - 1 tablet BID (not taking)  OTCs: Fiber - not taking, Tylenol - occasionally for shoulder pain, vitamin C - daily, probiotic - 1 daily   Pharmacy/Benefits: [facility] - usually picks up or daughter will, but delivery is an option; orders lung medications + 3 others through [facility]  Adherence: denies missed doses   Social support: has church friends that assist with grocery delivery  Affordability: no concerns, extra help with medicare   CCM Follow Up: 12/06/19 at 9:30 AM (telephone)  Debbora Dus, PharmD Clinical Pharmacist [facility] at [facility] [phone]

## 2019-10-17 NOTE — Telephone Encounter (Signed)
Please sign referral

## 2019-11-03 DIAGNOSIS — J84111 Idiopathic interstitial pneumonia, not otherwise specified: Secondary | ICD-10-CM | POA: Diagnosis not present

## 2019-11-03 DIAGNOSIS — J8489 Other specified interstitial pulmonary diseases: Secondary | ICD-10-CM | POA: Diagnosis not present

## 2019-11-03 DIAGNOSIS — G4733 Obstructive sleep apnea (adult) (pediatric): Secondary | ICD-10-CM | POA: Diagnosis not present

## 2019-11-11 ENCOUNTER — Other Ambulatory Visit: Payer: Self-pay | Admitting: Family Medicine

## 2019-11-11 NOTE — Telephone Encounter (Signed)
Last office visit 07/26/2019 for CPE.  Last refilled 01/27/2019 for 30 g with no refills.  Next Appt: 01/24/2020 for DM.

## 2019-12-03 DIAGNOSIS — J8489 Other specified interstitial pulmonary diseases: Secondary | ICD-10-CM | POA: Diagnosis not present

## 2019-12-03 DIAGNOSIS — G4733 Obstructive sleep apnea (adult) (pediatric): Secondary | ICD-10-CM | POA: Diagnosis not present

## 2019-12-03 DIAGNOSIS — J84111 Idiopathic interstitial pneumonia, not otherwise specified: Secondary | ICD-10-CM | POA: Diagnosis not present

## 2019-12-06 ENCOUNTER — Other Ambulatory Visit: Payer: Self-pay

## 2019-12-06 ENCOUNTER — Ambulatory Visit: Payer: Medicare Other

## 2019-12-06 ENCOUNTER — Telehealth: Payer: Self-pay

## 2019-12-06 DIAGNOSIS — E1159 Type 2 diabetes mellitus with other circulatory complications: Secondary | ICD-10-CM

## 2019-12-06 DIAGNOSIS — E785 Hyperlipidemia, unspecified: Secondary | ICD-10-CM

## 2019-12-06 NOTE — Telephone Encounter (Signed)
For medication list accuracy, could we remove losartan/HCTZ combination product and diclofenac tablets? Patient is taking losartan and HCTZ separately which are already on medication list and completed course of diclofenac.  Thanks!  Debbora Dus, PharmD Clinical Pharmacist [facility] at [facility] [phone]

## 2019-12-06 NOTE — Patient Instructions (Signed)
Dec 06, 2019  Dear Kaitlin Hamilton,  Below is a summary of the goals we discussed during our follow up call on Dec 06, 2019. Please contact me anytime with questions or concerns.   Visit Information  Goals Addressed            This Visit's Progress   . Pharmacy Care Plan       CARE PLAN ENTRY  Current Barriers:  . Chronic Disease Management support, education, and care coordination needs related to hypertension, hyperlipidemia, diabetes  Pharmacist Clinical Goal(s):  Marland Kitchen Cholesterol: Improve LDL cholesterol within goal of < 100 (previous LDL: 134) . Diabetes: Maintain diabetes control with A1c < 7% and prevent hypoglycemia (blood glucose < 70 mg/dL). . Hypertension: Maintain blood pressure within goal of < 140/90 mmHg. . Vaccinations: Remain up to date on vaccinations. Recommend updating tetanus booster this year (last Tdap 07/2009).  Interventions: . Comprehensive medication review performed . Reassess cholesterol after labs in June 2021  Patient Self Care Activities:  Over the next 6 months until follow up visit: . Continue monitoring blood pressure with symptoms and once monthly, document, and provide at future appointments. . Continue monitoring blood glucose once daily. Recommend varying the time of day to see if blood glucose is controlled 1-2 hours after meals and at bedtime. Continue to document and provide at future appointments. Call with any episodes of low blood glucose (< 70 mg/dL). . Limit cholesterol content in foods. . Check with local pharmacy for tetanus vaccine.  Goals updated, see past updates for previous goals       The patient verbalized understanding of instructions provided today and agreed to receive a mailed copy of patient instruction and/or educational materials.  Telephone follow up appointment with pharmacy team member scheduled for: 06/08/20 at 9:30 AM (telephone)  Debbora Dus, PharmD Clinical Pharmacist [facility] at [facility] [phone]  https://www.cdc.gov/vaccines/hcp/vis/vis-statements/tdap.pdf">  Tdap (Tetanus, Diphtheria, Pertussis) Vaccine: What You Need to Know 1. Why get vaccinated? Tdap vaccine can prevent tetanus, diphtheria, and pertussis. Diphtheria and pertussis spread from person to person. Tetanus enters the body through cuts or wounds.  TETANUS (T) causes painful stiffening of the muscles. Tetanus can lead to serious health problems, including being unable to open the mouth, having trouble swallowing and breathing, or death.  DIPHTHERIA (D) can lead to difficulty breathing, heart failure, paralysis, or death.  PERTUSSIS (aP), also known as "whooping cough," can cause uncontrollable, violent coughing which makes it hard to breathe, eat, or drink. Pertussis can be extremely serious in babies and young children, causing pneumonia, convulsions, brain damage, or death. In teens and adults, it can cause weight loss, loss of bladder control, passing out, and rib fractures from severe coughing. 2. Tdap vaccine Tdap is only for children 7 years and older, adolescents, and adults.  Adolescents should receive a single dose of Tdap, preferably at age 79 or 75 years. Pregnant women should get a dose of Tdap during every pregnancy, to protect the newborn from pertussis. Infants are most at risk for severe, life-threatening complications from pertussis. Adults who have never received Tdap should get a dose of Tdap. Also, adults should receive a booster dose every 10 years, or earlier in the case of a severe and dirty wound or burn. Booster doses can be either Tdap or Td (a different vaccine that protects against tetanus and diphtheria but not pertussis). Tdap may be given at the same time as other vaccines. 3. Talk with your health care  provider Tell your vaccine provider if the person getting the vaccine:  Has had an allergic reaction after a previous dose of any vaccine that protects against tetanus,  diphtheria, or pertussis, or has any severe, life-threatening allergies.  Has had a coma, decreased level of consciousness, or prolonged seizures within 7 days after a previous dose of any pertussis vaccine (DTP, DTaP, or Tdap).  Has seizures or another nervous system problem.  Has ever had Guillain-Barr Syndrome (also called GBS).  Has had severe pain or swelling after a previous dose of any vaccine that protects against tetanus or diphtheria. In some cases, your health care provider may decide to postpone Tdap vaccination to a future visit.  People with minor illnesses, such as a cold, may be vaccinated. People who are moderately or severely ill should usually wait until they recover before getting Tdap vaccine.  Your health care provider can give you more information. 4. Risks of a vaccine reaction  Pain, redness, or swelling where the shot was given, mild fever, headache, feeling tired, and nausea, vomiting, diarrhea, or stomachache sometimes happen after Tdap vaccine. People sometimes faint after medical procedures, including vaccination. Tell your provider if you feel dizzy or have vision changes or ringing in the ears.  As with any medicine, there is a very remote chance of a vaccine causing a severe allergic reaction, other serious injury, or death. 5. What if there is a serious problem? An allergic reaction could occur after the vaccinated person leaves the clinic. If you see signs of a severe allergic reaction (hives, swelling of the face and throat, difficulty breathing, a fast heartbeat, dizziness, or weakness), call 9-1-1 and get the person to the nearest hospital. For other signs that concern you, call your health care provider.  Adverse reactions should be reported to the Vaccine Adverse Event Reporting System (VAERS). Your health care provider will usually file this report, or you can do it yourself. Visit the VAERS website at www.vaers.SamedayNews.es or call (980) 555-7583. VAERS is  only for reporting reactions, and VAERS staff do not give medical advice. 6. The National Vaccine Injury Compensation Program The [employer] Vaccine Injury Compensation Program (VICP) is a federal program that was created to compensate people who may have been injured by certain vaccines. Visit the VICP website at GoldCloset.com.ee or call 979-779-9589 to learn about the program and about filing a claim. There is a time limit to file a claim for compensation. 7. How can I learn more?  Ask your health care provider.  Call your local or state health department.  Contact the Centers for Disease Control and Prevention (CDC): ? Call (507)294-5812 (1-800-CDC-INFO) or ? Visit CDC's website at http://hunter.com/ Vaccine Information Statement Tdap (Tetanus, Diphtheria, Pertussis) Vaccine (10/20/2018) This information is not intended to replace advice given to you by your health care provider. Make sure you discuss any questions you have with your health care provider. Document Revised: 10/29/2018 Document Reviewed: 11/01/2018 Elsevier Patient Education  [place].

## 2019-12-06 NOTE — Chronic Care Management (AMB) (Signed)
Chronic Care Management Pharmacy  Name: Kaitlin Hamilton  MRN: [id number] DOB: 01-12-1951  Chief Complaint/ HPI  Kaitlin Hamilton,  69 y.o., female presents for their Follow-Up CCM visit with the clinical pharmacist via telephone.  PCP : [name], MD  Their chronic conditions include: hypertension, sleep apnea, allergic rhinitis, chronic respiratory failure, GERD, hyperlipidemia, diabetes, migraine, depression, insomnia   Patient concerns: denies medication concerns  Office Visits:   07/26/19: [name] - stop Invokana, start [place] due to insurance, cholesterol elevated   Consult Visit:  10/12/19: UIP - increase Cellcept to 1 gm BID due to cough, rx astelin, delsym cough 5 mL BID, and chlorpheniramine 4 mg qhs   Allergies  Allergen Reactions  . Adhesive [Tape]     Redness, bruising with any adhesives- bandaids, patches, etc.   . Atovaquone Itching  . Codeine Hives and Swelling  . Demerol [Meperidine] Hives and Swelling  . Hydrocodone Nausea Only  . Trazodone And Nefazodone Itching  . Sulfa Antibiotics Rash   Medications: Outpatient Encounter Medications as of 12/06/2019  Medication Sig  . nystatin cream (MYCOSTATIN) Apply 1 application topically 3 (three) times daily.  Marland Kitchen acetaminophen (TYLENOL) 500 MG tablet Take 1,000 mg by mouth every 6 (six) hours as needed for moderate pain.  . Ascorbic Acid (VITAMIN C PO) Take by mouth.  Marland Kitchen azelastine (ASTELIN) 0.1 % nasal spray Place 1 spray into both nostrils 2 (two) times daily. Use in each nostril as directed  . dapagliflozin propanediol (FARXIGA) 10 MG TABS tablet Take 10 mg by mouth daily.  . diclofenac (VOLTAREN) 75 MG EC tablet Take 1 tablet (75 mg total) by mouth 2 (two) times daily. (Patient not taking: Reported on 10/17/2019)  . ESBRIET 801 MG TABS TAKE 1 TABLET (801MG ) BY  MOUTH THREE TIMES DAILY  WITH FOOD  . FIBER PO Take 1 tablet by mouth daily.  . fluticasone (FLONASE) 50 MCG/ACT nasal spray Place 1 spray into both nostrils  daily.  . hydrochlorothiazide (HYDRODIURIL) 12.5 MG tablet TAKE 1 TABLET DAILY ALONG WITH ONE OF THE LOSARTAN 50 MG TABLETS  . levothyroxine (SYNTHROID) 125 MCG tablet TAKE 1 TABLET BY MOUTH  DAILY BEFORE BREAKFAST  . losartan (COZAAR) 50 MG tablet TAKE 1 TABLET DAILY ALONG WITH 1 OF THE HCTZ 12.5MG  TABLETS  . losartan-hydrochlorothiazide (HYZAAR) 50-12.5 MG tablet Take 1 tablet by mouth daily. (Patient not taking: Reported on 10/17/2019)  . metFORMIN (GLUCOPHAGE-XR) 500 MG 24 hr tablet Take 3 tablets (1,500 mg total) by mouth daily with breakfast.  . montelukast (SINGULAIR) 10 MG tablet Take 1 tablet (10 mg total) by mouth at bedtime.  . mycophenolate (CELLCEPT) 500 MG tablet Take 1,000 mg by mouth 2 (two) times daily.  . NON FORMULARY Place 4 L into the nose daily. 10 Liters with exertion  . OneTouch Delica Lancets 99991111 MISC CHECK BLOOD SUGAR ONCE DAILY.  Marland Kitchen ONETOUCH ULTRA test strip CHECK BLOOD SUGAR DAILY.  . pantoprazole (PROTONIX) 40 MG tablet TAKE 1 TABLET BY MOUTH  TWICE DAILY  . pioglitazone (ACTOS) 45 MG tablet Take 1 tablet (45 mg total) by mouth daily.  . pravastatin (PRAVACHOL) 40 MG tablet Take 1qd  . Probiotic Product (PROBIOTIC DAILY PO) Take by mouth.  . verapamil (CALAN-SR) 180 MG CR tablet TAKE 1 TABLET BY MOUTH  DAILY   No facility-administered encounter medications on file as of 12/06/2019.   Current Diagnosis/Assessment: Goals    . Increase water intake     Starting 07/08/2018, I will  continue to drink at least 6-8 glasses of water daily.     . Patient Stated     07/12/2019, I will continue to work on losing more weight.      . Pharmacy Care Plan     CARE PLAN ENTRY  Current Barriers:  . Chronic Disease Management support, education, and care coordination needs related to hypertension, hyperlipidemia, diabetes  Pharmacist Clinical Goal(s):  Marland Kitchen Cholesterol: Improve LDL cholesterol within goal of < 100 (previous LDL: 134) . Diabetes: Maintain diabetes control with  A1c < 7% and prevent hypoglycemia (blood glucose < 70 mg/dL). . Hypertension: Maintain blood pressure within goal of < 140/90 mmHg. . Vaccinations: Remain up to date on vaccinations. Recommend updating tetanus booster this year (last Tdap 07/2009).  Interventions: . Comprehensive medication review performed . Reassess cholesterol after labs in June 2021  Patient Self Care Activities:  Over the next 6 months until follow up visit: . Continue monitoring blood pressure with symptoms and once monthly, document, and provide at future appointments. . Continue monitoring blood glucose once daily. Recommend varying the time of day to see if blood glucose is controlled 1-2 hours after meals and at bedtime. Continue to document and provide at future appointments. Call with any episodes of low blood glucose (< 70 mg/dL). . Limit cholesterol content in foods. . Check with local pharmacy for tetanus vaccine.  Goals updated, see past updates for previous goals       Diabetes   Recent Relevant Labs: Lab Results  Component Value Date/Time   HGBA1C 6.7 (H) 07/12/2019 08:58 AM   HGBA1C 6.6 (H) 02/01/2019 09:30 AM   MICROALBUR 5.1 (H) 11/08/2013 08:58 AM    Checking BG: Daily Recent FBG Readings:114, 129, 150, 200, 180, 137, 170, 169, 156 Denies hypoglycemia  Patient has failed these meds in past: Invokana (insurance not preferred)  Patient is currently controlled on the following medications:   Farxiga 10 mg - 1 tablet daily  Pioglitazone 45 mg - 1 tablet daily   Metformin 500 mg - 3 tablets daily with breakfast  Last diabetic eye exam:  Lab Results  Component Value Date/Time   HMDIABEYEEXA No Retinopathy 02/09/2018 12:00 AM    Last diabetic foot exam:  Lab Results  Component Value Date/Time   HMDIABFOOTEX done 07/25/2019 12:00 AM    We discussed: continues to takes all three meds in the morning; has maintained weight loss, down from 267 to 226 lbs (over the past year), reports  today, 12/06/19 weight 228 lbs   Exercise: denies changes - stays busy, walks around the house   Diet: denies changes - tries to limit portions, only eats 1-2 meals a day (2PM or later), banana or fruit for breakfast, fruit as snack, tries to drink a lot of water, poor appetite  Plan: Continue current medications; Schedule annual comprehensive eye exam.   Hypertension   CMP Latest Ref Rng & Units 07/17/2019 07/12/2019 02/01/2019  Glucose 70 - 99 mg/dL 143(H) 160(H) 149(H)  BUN 8 - 23 mg/dL 19 14 10   Creatinine 0.44 - 1.00 mg/dL 0.78 0.77 0.69  Sodium 135 - 145 mmol/L 136 136 137  Potassium 3.5 - 5.1 mmol/L 2.8(L) 4.2 4.2  Chloride 98 - 111 mmol/L 93(L) 96 97  CO2 22 - 32 mmol/L 26 30 31   Calcium 8.9 - 10.3 mg/dL 9.6 10.3 10.1  Total Protein 6.5 - 8.1 g/dL 8.2(H) 7.7 7.6  Total Bilirubin 0.3 - 1.2 mg/dL 0.7 0.4 0.4  Alkaline Phos 38 -  126 U/L 71 71 75  AST 15 - 41 U/L 17 13 12   ALT 0 - 44 U/L 12 8 10    Office blood pressures are: BP Readings from Last 3 Encounters:  10/12/19 120/70  08/15/19 (!) 116/58  07/26/19 110/60   BP goal < 140/90 mmHg Patient has failed these meds in the past: none reported Patient checks BP at home: when feeling symptomatic Patient home BP readings: 133/70, 91 (this morning - 12/06/19)  Patient is currently controlled on the following medications:   Verapamil 180 mg CR - 1 tablet daily  Losartan 50 mg - 1 tablet daily  Hydrochlorothiazide 12.5 mg - 1 tablet daily  Plan: Continue current medications; Request med list update to remove duplicate losartan/hctz combination product.   Hyperlipidemia   Lipid Panel     Component Value Date/Time   CHOL 234 (H) 07/12/2019 0858   TRIG 124.0 07/12/2019 0858   HDL 75.30 07/12/2019 0858   CHOLHDL 3 07/12/2019 0858   VLDL 24.8 07/12/2019 0858   LDLCALC 134 (H) 07/12/2019 0858   LDLDIRECT 104.0 09/04/2017 0956    LDL goal < 100 The 10-year ASCVD risk score Mikey Bussing DC Jr., et al., 2013) is: 18.2%    Values used to calculate the score:     Age: 59 years     Sex: Female     Is Non-Hispanic African American: No     Diabetic: Yes     Tobacco smoker: No     Systolic Blood Pressure: 123456 mmHg     Is BP treated: Yes     HDL Cholesterol: 75.3 mg/dL     Total Cholesterol: 234 mg/dL   Patient has failed these meds in past: atorvastatin (muscle pain) Patient is currently uncontrolled on the following medications:   Pravastatin 40 mg - 1 tablet daily (evening)   We discussed: elevated cholesterol, mailed handout of DASH diet at previous visit; limiting dietary of intake of beef liver and red meats   Plan: Continue current medications; Encouraged continued effort towards diet and exercise goals. Cholesterol labs scheduled for 12/2019.   Medication Management  Pharmacy/Benefits: [facility]  + Mail order/[employer]   Adherence: denies missed doses   Social support: church friends assist with grocery delivery, not very comfortable going out   Affordability: no concerns, extra help with medicare   CCM Follow Up: 6 months, 06/08/20 at 9:30 AM (telephone)  Debbora Dus, PharmD Clinical Pharmacist [facility] [phone]

## 2019-12-06 NOTE — Telephone Encounter (Signed)
Medication list updated.

## 2019-12-09 ENCOUNTER — Telehealth: Payer: Self-pay | Admitting: Pulmonary Disease

## 2019-12-09 NOTE — Telephone Encounter (Signed)
Left a voicemail for patient to call our office back regarding prior message.    pt would like montelucast prescription filled as 4 per day vs 2. please advise.  Consulted with Tammy Parrett patient is only to take Montelukast 10mg  once daily .

## 2019-12-12 NOTE — Telephone Encounter (Signed)
Called and spoke with patient she is needing a refill on medication listed below not montelukast. Patient states that she takes a total of 4 pills a day 2 in the morning and 2 at night.   mycophenolate (CELLCEPT) 500 MG tablet    Dr. Vaughan Browner can you advise on how much patient is supposed to be taking of this medication and if you are ok with [place] sending in prescription. RX has to go to [facility]

## 2019-12-13 NOTE — Telephone Encounter (Signed)
She gets the montelukast from her primary care so should reach out to them for a refill. And clarify to her that she should be taking it only once a day at bedtime

## 2019-12-13 NOTE — Telephone Encounter (Signed)
Pt is needing a refill on Mycophenolate, not Montelukast.  Dr. Vaughan Browner - please advise on refill. Thanks!

## 2019-12-14 ENCOUNTER — Other Ambulatory Visit: Payer: Self-pay

## 2019-12-14 MED ORDER — MYCOPHENOLATE MOFETIL 500 MG PO TABS: 1000.0000 mg | ORAL_TABLET | Freq: Two times a day (BID) | ORAL | 0 refills | Status: DC

## 2019-12-14 NOTE — Telephone Encounter (Signed)
Yes it is okay to refill the mycophenolate

## 2019-12-14 NOTE — Telephone Encounter (Signed)
Spoke with patient regarding refill request for Mycophenolate 500mg  2 in the morning and 2 in the evening QTY 120 .Patient is aware of refill request. Patient voice is understanding. Nothing else further needed.

## 2019-12-14 NOTE — Telephone Encounter (Signed)
Left message for patient to call back  

## 2020-01-03 DIAGNOSIS — J8489 Other specified interstitial pulmonary diseases: Secondary | ICD-10-CM | POA: Diagnosis not present

## 2020-01-03 DIAGNOSIS — J84111 Idiopathic interstitial pneumonia, not otherwise specified: Secondary | ICD-10-CM | POA: Diagnosis not present

## 2020-01-03 DIAGNOSIS — G4733 Obstructive sleep apnea (adult) (pediatric): Secondary | ICD-10-CM | POA: Diagnosis not present

## 2020-01-17 ENCOUNTER — Telehealth: Payer: Self-pay | Admitting: Family Medicine

## 2020-01-17 ENCOUNTER — Other Ambulatory Visit: Payer: Self-pay

## 2020-01-17 ENCOUNTER — Telehealth: Payer: Medicare Other

## 2020-01-17 ENCOUNTER — Other Ambulatory Visit (INDEPENDENT_AMBULATORY_CARE_PROVIDER_SITE_OTHER): Payer: Medicare Other

## 2020-01-17 DIAGNOSIS — E1159 Type 2 diabetes mellitus with other circulatory complications: Secondary | ICD-10-CM

## 2020-01-17 DIAGNOSIS — E118 Type 2 diabetes mellitus with unspecified complications: Secondary | ICD-10-CM

## 2020-01-17 LAB — COMPREHENSIVE METABOLIC PANEL
ALT: 12 U/L (ref 0–35)
AST: 16 U/L (ref 0–37)
Albumin: 4.5 g/dL (ref 3.5–5.2)
Alkaline Phosphatase: 94 U/L (ref 39–117)
BUN: 15 mg/dL (ref 6–23)
CO2: 31 mEq/L (ref 19–32)
Calcium: 10.4 mg/dL (ref 8.4–10.5)
Chloride: 94 mEq/L — ABNORMAL LOW (ref 96–112)
Creatinine, Ser: 0.65 mg/dL (ref 0.40–1.20)
GFR: 90.34 mL/min (ref >60.00)
Glucose, Bld: 171 mg/dL — ABNORMAL HIGH (ref 70–99)
Potassium: 4.2 mEq/L (ref 3.5–5.1)
Sodium: 135 mEq/L (ref 135–145)
Total Bilirubin: 0.4 mg/dL (ref 0.2–1.2)
Total Protein: 7.8 g/dL (ref 6.0–8.3)

## 2020-01-17 LAB — LIPID PANEL
Cholesterol: 208 mg/dL — ABNORMAL HIGH (ref 0–200)
HDL: 68.5 mg/dL (ref >39.00)
LDL Cholesterol: 102 mg/dL — ABNORMAL HIGH (ref 0–99)
NonHDL: 139.05
Total CHOL/HDL Ratio: 3
Triglycerides: 183 mg/dL — ABNORMAL HIGH (ref 0.0–149.0)
VLDL: 36.6 mg/dL (ref 0.0–40.0)

## 2020-01-17 LAB — HEMOGLOBIN A1C: Hgb A1c MFr Bld: 7.3 % — ABNORMAL HIGH (ref 4.6–6.5)

## 2020-01-17 MED ORDER — AZELASTINE HCL 0.1 % NA SOLN: 1.0000 | Freq: Two times a day (BID) | NASAL | 1 refills | Status: DC

## 2020-01-17 NOTE — Telephone Encounter (Signed)
Received Rx request for Azaelastine 01% from [facility].  Rx has been sent to preferred pharmacy. Nothing further is needed.

## 2020-01-17 NOTE — Telephone Encounter (Signed)
-----   Message from Cloyd Stagers, RT sent at 01/03/2020  2:41 PM EDT ----- Regarding: Lab Orders for Tuesday 6.29.2021 Please place lab orders for Tuesday 6.29.2021, office visit for 6 month DM on Tuesday 7.6.2021 Thank you, Dyke Maes RT(R)

## 2020-01-17 NOTE — Progress Notes (Signed)
No critical labs need to be addressed urgently. We will discuss labs in detail at upcoming office visit.   

## 2020-01-24 ENCOUNTER — Ambulatory Visit (INDEPENDENT_AMBULATORY_CARE_PROVIDER_SITE_OTHER): Payer: Medicare Other | Admitting: Family Medicine

## 2020-01-24 ENCOUNTER — Other Ambulatory Visit: Payer: Self-pay

## 2020-01-24 ENCOUNTER — Encounter: Payer: Self-pay | Admitting: Family Medicine

## 2020-01-24 VITALS — BP 102/60 | HR 108 | Temp 98.2°F | Ht 66.5 in | Wt 228.0 lb

## 2020-01-24 DIAGNOSIS — E1169 Type 2 diabetes mellitus with other specified complication: Secondary | ICD-10-CM | POA: Diagnosis not present

## 2020-01-24 DIAGNOSIS — E785 Hyperlipidemia, unspecified: Secondary | ICD-10-CM

## 2020-01-24 DIAGNOSIS — E1159 Type 2 diabetes mellitus with other circulatory complications: Secondary | ICD-10-CM

## 2020-01-24 DIAGNOSIS — I10 Essential (primary) hypertension: Secondary | ICD-10-CM | POA: Diagnosis not present

## 2020-01-24 DIAGNOSIS — E118 Type 2 diabetes mellitus with unspecified complications: Secondary | ICD-10-CM | POA: Diagnosis not present

## 2020-01-24 DIAGNOSIS — I152 Hypertension secondary to endocrine disorders: Secondary | ICD-10-CM

## 2020-01-24 DIAGNOSIS — F3340 Major depressive disorder, recurrent, in remission, unspecified: Secondary | ICD-10-CM

## 2020-01-24 NOTE — Assessment & Plan Note (Signed)
Mild.. going to counseling with pastor.  Not interested in medication at this time.

## 2020-01-24 NOTE — Assessment & Plan Note (Signed)
Worsened control in last 6 months with isolation and poor diet. Unable to exercise much given respiratory status.

## 2020-01-24 NOTE — Patient Instructions (Addendum)
Keep up with water. Stop sweets and soda. Continue current medications.
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
## 2020-01-24 NOTE — Assessment & Plan Note (Signed)
Good control with pravastatin. LDL at goal < 100 and HDL > 60. Encouraged exercise, weight loss, healthy eating habits.

## 2020-01-24 NOTE — Assessment & Plan Note (Signed)
Low today.. patient minimally affected.. continue current meds and follow.

## 2020-02-02 ENCOUNTER — Ambulatory Visit: Payer: Medicare Other | Admitting: Pulmonary Disease

## 2020-02-02 ENCOUNTER — Other Ambulatory Visit: Payer: Self-pay

## 2020-02-02 ENCOUNTER — Encounter: Payer: Self-pay | Admitting: Pulmonary Disease

## 2020-02-02 VITALS — BP 124/70 | Ht 66.0 in | Wt 229.0 lb

## 2020-02-02 DIAGNOSIS — G4733 Obstructive sleep apnea (adult) (pediatric): Secondary | ICD-10-CM | POA: Diagnosis not present

## 2020-02-02 DIAGNOSIS — Z5181 Encounter for therapeutic drug level monitoring: Secondary | ICD-10-CM

## 2020-02-02 DIAGNOSIS — J849 Interstitial pulmonary disease, unspecified: Secondary | ICD-10-CM

## 2020-02-02 DIAGNOSIS — J84112 Idiopathic pulmonary fibrosis: Secondary | ICD-10-CM

## 2020-02-02 DIAGNOSIS — J84111 Idiopathic interstitial pneumonia, not otherwise specified: Secondary | ICD-10-CM | POA: Diagnosis not present

## 2020-02-02 DIAGNOSIS — R0602 Shortness of breath: Secondary | ICD-10-CM | POA: Diagnosis not present

## 2020-02-02 DIAGNOSIS — J8489 Other specified interstitial pulmonary diseases: Secondary | ICD-10-CM | POA: Diagnosis not present

## 2020-02-02 MED ORDER — MYCOPHENOLATE MOFETIL 500 MG PO TABS: 1000.0000 mg | ORAL_TABLET | Freq: Two times a day (BID) | ORAL | 2 refills | Status: DC

## 2020-02-02 NOTE — Patient Instructions (Signed)
Glad you are doing well with regard to your breathing We will change escalating prescription to 5 L with rest and 8 L with exercise and call the DME company to ensure regular O2 delivery  Schedule spirometry, diffusion capacity and high-res CT in 6 months and follow-up in clinic

## 2020-02-02 NOTE — Addendum Note (Signed)
Addended by: Elton Sin on: 02/02/2020 10:56 AM   Modules accepted: Orders

## 2020-02-02 NOTE — Progress Notes (Signed)
Kaitlin Hamilton    [id number]    1951-06-30  Primary Care Physician:Bedsole, Mervyn Gay, MD  Referring Physician: [name], MD [place],  [place] [address]  Chief complaint: Follow-up for interstitial lung disease  HPI: 69 year old with history of biopsy-proven UIP pulmonary fibrosis.  Previously followed by Dr. [name] lung biopsy in December 2014 with findings showing UIP fibrosis.  There is strong suspicion for underlying connective tissue disease as she responds to prednisone.  Maintain on CellCept 500 mg twice daily which was increased to 1 g twice daily and 2019.  She has also been evaluated by Dr. [name] [name] at [facility] Esbriet started in 2018 for progressive pulmonary fibrosis.  Continues on Esbriet, CellCept 1 g twice daily.  She is off prednisone at present States that breathing is doing well She has had 2 episodes of diverticulitis in 2019 and 2020, E. coli UTI in 2020 and Campylobacter colitis in December 2020.  Patient is here with daughter and question if CellCept is causing her to have recurrent nonpulmonary infections.  Interim history: We reduced her CellCept to 500 mg twice daily due to recurrent episodes of UTI however her breathing got worse and she is back now at 1 g twice daily with stabilization of symptoms No new complaints today.  Outpatient Encounter Medications as of 02/02/2020  Medication Sig  . acetaminophen (TYLENOL) 500 MG tablet Take 1,000 mg by mouth every 6 (six) hours as needed for moderate pain.  . Ascorbic Acid (VITAMIN C PO) Take by mouth.  Marland Kitchen azelastine (ASTELIN) 0.1 % nasal spray Place 1 spray into both nostrils 2 (two) times daily. Use in each nostril as directed  . dapagliflozin propanediol (FARXIGA) 10 MG TABS tablet Take 10 mg by mouth daily.  . ESBRIET 801 MG TABS TAKE 1 TABLET (801MG) BY  MOUTH THREE TIMES DAILY  WITH FOOD  . FIBER PO Take 1 tablet by mouth daily.  . fluticasone (FLONASE) 50 MCG/ACT nasal  spray Place 1 spray into both nostrils daily.  . hydrochlorothiazide (HYDRODIURIL) 12.5 MG tablet TAKE 1 TABLET DAILY ALONG WITH ONE OF THE LOSARTAN 50 MG TABLETS  . levothyroxine (SYNTHROID) 125 MCG tablet TAKE 1 TABLET BY MOUTH  DAILY BEFORE BREAKFAST  . losartan (COZAAR) 50 MG tablet TAKE 1 TABLET DAILY ALONG WITH 1 OF THE HCTZ 12.5MG TABLETS  . metFORMIN (GLUCOPHAGE-XR) 500 MG 24 hr tablet Take 3 tablets (1,500 mg total) by mouth daily with breakfast.  . montelukast (SINGULAIR) 10 MG tablet Take 1 tablet (10 mg total) by mouth at bedtime.  . mycophenolate (CELLCEPT) 500 MG tablet Take 2 tablets (1,000 mg total) by mouth 2 (two) times daily.  . NON FORMULARY Place 4 L into the nose daily. 10 Liters with exertion  . nystatin cream (MYCOSTATIN) Apply 1 application topically 3 (three) times daily.  [name] Delica Lancets 14H MISC CHECK BLOOD SUGAR ONCE DAILY.  Marland Kitchen ONETOUCH ULTRA test strip CHECK BLOOD SUGAR DAILY.  . pantoprazole (PROTONIX) 40 MG tablet TAKE 1 TABLET BY MOUTH  TWICE DAILY  . pioglitazone (ACTOS) 45 MG tablet Take 1 tablet (45 mg total) by mouth daily.  . pravastatin (PRAVACHOL) 40 MG tablet Take 1qd  . Probiotic Product (PROBIOTIC DAILY PO) Take by mouth.  . verapamil (CALAN-SR) 180 MG CR tablet TAKE 1 TABLET BY MOUTH  DAILY   No facility-administered encounter medications on file as of 02/02/2020.   Physical Exam: Blood pressure  124/70, height '5\' 6"'  (1.676 m), weight 229 lb (103.9 kg), SpO2 99 %. Gen:      No acute distress HEENT:  EOMI, sclera anicteric Neck:     No masses; no thyromegaly Lungs:    Clear to auscultation bilaterally; normal respiratory effort CV:         Regular rate and rhythm; no murmurs Abd:      + bowel sounds; soft, non-tender; no palpable masses, no distension Ext:    No edema; adequate peripheral perfusion Skin:      Warm and dry; no rash Neuro: alert and oriented x 3 Psych: normal mood and affect  Data Reviewed: Imaging: 11/2012 CT chest >>   centrilobular nodules, interlobular septal thickening worse in bases and periphery R lung > L; some GGO in bases and periphery as well, some bronchiectasis in the bases R > L; findings suggestive of fibrosis but not UIP; question hypersensitivity pneumonitis given centrilobular nodules; also question aspiration  04/2013 Barium swallow> abnormal esophageal motility, GERD, hiatal hernia  04/2013 CT chest ([name])> findings suggestive of but not diagnostic of NSIP, small pulmonary nodule  February 2016 CT chest high resolution> slight progression in reticular abnormalities consistent with pulmonary fibrosis, uip  High-resolution CT chest 08/04/2019-basilar predominant fibrotic interstitial lung disease with mild honeycombing.  Mild progressions 2016.  UIP pattern  I have reviewed the images personally  PFTs: 04/28/2018 FVC 1.1 [35%], FEV1 1.02 [42%], F/F 92, DLCO 8.88 [36%] Severe restriction, diffusion impairment. With worsening compared to prior years.  10/12/2019 FVC 1.13 [36%], FEV1 1.05 [44%], F/F 93, DLCO 8.76 [44%] Severe restriction, diffusion impairment.  Stable compared to 2019   6 minute walk 10/19/2012 walked 500 feet in office on room air oxygenation did not drop below 90% 04/2013 6MW RA > 1100 feet, HR peak 109, O2 sat Nadir 87% 11/2013 6MW 1364 feet, O2 saturation nadir 78% 08/21/14 6MW 1400 feet 90% on 8L  Labs: 03/2013 ANA, ANCA, Anti-Jo-1, ESR, RF, SCL-70, anti-centromere, SSA/SSB, all negative; CRP 0.6;   Assessment:  UIP fibrosis Presumed autoimmune component given response to immunosuppressive's in the past Currently maintained on CellCept 1 g twice daily, pirfenidone. She has not tolerated reduction in CellCept 200 mg twice daily Not on PJP prophylaxis due to allergies to sulfa drugs and atovaquone  OSA Continue CPAP Download reviewed with good compliance.  Plan/Recommendations: - Continue pirfenidone, CellCept 1 g twice daily - Continue supplemental  oxygen - Schedule spirometry, diffusion capacity and high-res CT in 6 months and follow-up in clinic  [name] MD [facility] 02/02/2020, 10:25 AM  CC: [name], MD

## 2020-02-02 NOTE — Addendum Note (Signed)
Addended by: Elton Sin on: 02/02/2020 03:37 PM   Modules accepted: Orders

## 2020-02-13 DIAGNOSIS — G4733 Obstructive sleep apnea (adult) (pediatric): Secondary | ICD-10-CM | POA: Diagnosis not present

## 2020-02-16 ENCOUNTER — Other Ambulatory Visit: Payer: Self-pay | Admitting: Family Medicine

## 2020-02-27 NOTE — Telephone Encounter (Signed)
Dr Vaughan Browner, pt asking if her CT can be done in Dec rather than Feb 2022 due to insurance purposes. Please advise, thanks!

## 2020-02-29 NOTE — Telephone Encounter (Signed)
Dr. Vaughan Browner has approved that this patient's CT should be done before the end of the year. Can [facility] pool schedule her for her HRCT that is already ordered? Thank you.

## 2020-02-29 NOTE — Telephone Encounter (Signed)
Yes we can reschedule high-resolution CT for end of December

## 2020-03-01 ENCOUNTER — Other Ambulatory Visit: Payer: Self-pay | Admitting: Family Medicine

## 2020-03-04 DIAGNOSIS — J8489 Other specified interstitial pulmonary diseases: Secondary | ICD-10-CM | POA: Diagnosis not present

## 2020-03-04 DIAGNOSIS — J84111 Idiopathic interstitial pneumonia, not otherwise specified: Secondary | ICD-10-CM | POA: Diagnosis not present

## 2020-03-04 DIAGNOSIS — G4733 Obstructive sleep apnea (adult) (pediatric): Secondary | ICD-10-CM | POA: Diagnosis not present

## 2020-03-19 ENCOUNTER — Other Ambulatory Visit: Payer: Self-pay | Admitting: Family Medicine

## 2020-03-19 DIAGNOSIS — J3089 Other allergic rhinitis: Secondary | ICD-10-CM

## 2020-03-21 ENCOUNTER — Ambulatory Visit: Payer: Medicare Other | Admitting: Pulmonary Disease

## 2020-03-27 ENCOUNTER — Other Ambulatory Visit: Payer: Self-pay | Admitting: Family Medicine

## 2020-03-27 NOTE — Telephone Encounter (Signed)
Last office visit 01/24/2020 for DM.  Last refilled 11/11/2019 for 30 g with no refills.  Next Appt: 04/24/2020 for DM.

## 2020-04-03 ENCOUNTER — Other Ambulatory Visit: Payer: Self-pay | Admitting: Family Medicine

## 2020-04-03 DIAGNOSIS — Z1231 Encounter for screening mammogram for malignant neoplasm of breast: Secondary | ICD-10-CM

## 2020-04-04 ENCOUNTER — Other Ambulatory Visit: Payer: Self-pay | Admitting: Pulmonary Disease

## 2020-04-04 DIAGNOSIS — G4733 Obstructive sleep apnea (adult) (pediatric): Secondary | ICD-10-CM | POA: Diagnosis not present

## 2020-04-04 DIAGNOSIS — J8489 Other specified interstitial pulmonary diseases: Secondary | ICD-10-CM | POA: Diagnosis not present

## 2020-04-12 ENCOUNTER — Other Ambulatory Visit: Payer: Self-pay | Admitting: Family Medicine

## 2020-04-17 LAB — HM DIABETES EYE EXAM

## 2020-04-24 ENCOUNTER — Ambulatory Visit (INDEPENDENT_AMBULATORY_CARE_PROVIDER_SITE_OTHER): Payer: Medicare Other | Admitting: Family Medicine

## 2020-04-24 ENCOUNTER — Other Ambulatory Visit: Payer: Self-pay

## 2020-04-24 ENCOUNTER — Other Ambulatory Visit: Payer: Self-pay | Admitting: *Deleted

## 2020-04-24 ENCOUNTER — Other Ambulatory Visit: Payer: Self-pay | Admitting: Family Medicine

## 2020-04-24 VITALS — BP 120/60 | HR 76 | Temp 97.7°F | Ht 66.5 in | Wt 233.6 lb

## 2020-04-24 DIAGNOSIS — E118 Type 2 diabetes mellitus with unspecified complications: Secondary | ICD-10-CM

## 2020-04-24 DIAGNOSIS — E1159 Type 2 diabetes mellitus with other circulatory complications: Secondary | ICD-10-CM

## 2020-04-24 DIAGNOSIS — I152 Hypertension secondary to endocrine disorders: Secondary | ICD-10-CM

## 2020-04-24 LAB — POCT GLYCOSYLATED HEMOGLOBIN (HGB A1C): Hemoglobin A1C: 7.6 % — AB (ref 4.0–5.6)

## 2020-04-24 MED ORDER — DAPAGLIFLOZIN PROPANEDIOL 10 MG PO TABS
10.0000 mg | ORAL_TABLET | Freq: Every day | ORAL | 3 refills | Status: DC
Start: 2020-04-24 — End: 2021-07-11

## 2020-04-24 MED ORDER — NYSTATIN 100000 UNIT/GM EX CREA
1.0000 | TOPICAL_CREAM | Freq: Three times a day (TID) | CUTANEOUS | 0 refills | Status: DC
Start: 2020-04-24 — End: 2020-09-04

## 2020-04-24 MED ORDER — METFORMIN HCL ER 500 MG PO TB24
2000.0000 mg | ORAL_TABLET | Freq: Every day | ORAL | 3 refills | Status: DC
Start: 2020-04-24 — End: 2021-06-17

## 2020-04-24 NOTE — Assessment & Plan Note (Signed)
Increase metformin to 2000 mg daily. Continue actos and farxiga. She will get back on track with diet. Counseled of lifestyle changes.  Of note A1C seems to be worsening in last 9 month .. the same time period since having to stop invokana and change to farxiga. Consider changing this med pr weaning off if she tolerates metfomin and A1C at goal.

## 2020-04-24 NOTE — Progress Notes (Signed)
Chief Complaint  Patient presents with  . Diabetes    History of Present Illness: HPI    69 year old female  On continuous oxygen for ILD presents for DM follow up  Diabetes:   Slight worsening of A1C in last several months.  On metformin ( 3 tabs daily), actos and  farxiga ( stopped invokana due to insurance and changes to farxiga in 07/2019  Unable to exercise due to respiratory status.   She has had some issue making diet changes, eating more bread... still drinking soda Lab Results  Component Value Date   HGBA1C 7.6 (A) 04/24/2020  Using medications without difficulties: Hypoglycemic episodes: none Hyperglycemic episodes: rare Feet problems: no ulcers Blood Sugars averaging: 119-200 eye exam within last year: yes  Wt Readings from Last 3 Encounters:  04/24/20 233 lb 9 oz (105.9 kg)  02/02/20 229 lb (103.9 kg)  01/24/20 228 lb (103.4 kg)      This visit occurred during the SARS-CoV-2 public health emergency.  Safety protocols were in place, including screening questions prior to the visit, additional usage of staff PPE, and extensive cleaning of exam room while observing appropriate contact time as indicated for disinfecting solutions.   COVID 19 screen:  No recent travel or known exposure to COVID19 The patient denies respiratory symptoms of COVID 19 at this time. The importance of social distancing was discussed today.     Review of Systems  Constitutional: Negative for chills and fever.  HENT: Negative for congestion and ear pain.   Eyes: Negative for pain and redness.  Respiratory: Negative for cough and shortness of breath.   Cardiovascular: Negative for chest pain, palpitations and leg swelling.  Gastrointestinal: Negative for abdominal pain, blood in stool, constipation, diarrhea, nausea and vomiting.  Genitourinary: Negative for dysuria.  Musculoskeletal: Negative for falls and myalgias.  Skin: Negative for rash.  Neurological: Negative for dizziness.   Psychiatric/Behavioral: Negative for depression. The patient is not nervous/anxious.       Past Medical History:  Diagnosis Date  . Allergic rhinitis    uses Flonase daily  . Anesthesia complication    Per pt/ past endoscopy in 2015, the anesthetic spray in back throat caused her to have breathing problems  . Anxiety   . Bronchitis   . Chicken pox   . Constipation    takes Fiber daily  . Depression   . Diverticulosis   . Dysphagia   . GERD (gastroesophageal reflux disease)    takes Protonix daily  . H/O hiatal hernia   . Hemorrhoids   . History of bronchitis    2013  . History of colon polyps   . History of kidney stones   . Hyperlipidemia    lost 43 pounds and no meds required at present  . Hypertension    takes Verapamil and HCTZ daily  . Hypokalemia   . Hypothyroidism (acquired)    takes Synthroid daily  . Insomnia    doesn't take any meds for this  . Joint swelling    left thumb  . Migraines    last one about a month ago  . Non-insulin dependent type 2 diabetes mellitus ([place])    takes Glipizide daily  . OA (osteoarthritis)    left knee  . Oxygen deficiency   . Oxygen dependent    Pt is on continuous O2 at 3 liters!  . Pneumonia    last time in 2006  . PONV (postoperative nausea and vomiting)   . Shortness  of breath    with exertion;takes Singulair daily as well as Flonase  . Urinary frequency     reports that she has never smoked. She has never used smokeless tobacco. She reports that she does not drink alcohol and does not use drugs.   Current Outpatient Medications:  .  nystatin cream (MYCOSTATIN), Apply 1 application topically 3 (three) times daily., Disp: 30 g, Rfl: 0 .  acetaminophen (TYLENOL) 500 MG tablet, Take 1,000 mg by mouth every 6 (six) hours as needed for moderate pain., Disp: , Rfl:  .  Ascorbic Acid (VITAMIN C PO), Take by mouth., Disp: , Rfl:  .  azelastine (ASTELIN) 0.1 % nasal spray, Place 1 spray into both nostrils 2 (two) times  daily. Use in each nostril as directed, Disp: 30 mL, Rfl: 1 .  dapagliflozin propanediol (FARXIGA) 10 MG TABS tablet, Take 10 mg by mouth daily., Disp: 30 tablet, Rfl: 11 .  ESBRIET 801 MG TABS, TAKE 1 TABLET (801MG ) BY  MOUTH THREE TIMES DAILY  WITH FOOD, Disp: 270 tablet, Rfl: 6 .  FIBER PO, Take 1 tablet by mouth daily., Disp: , Rfl:  .  fluticasone (FLONASE) 50 MCG/ACT nasal spray, Place 1 spray into both nostrils daily., Disp: , Rfl:  .  hydrochlorothiazide (MICROZIDE) 12.5 MG capsule, TAKE 1 TABLET DAILY ALONG WITH ONE OF THE LOSARTAN 50 MG TABLETS, Disp: 90 capsule, Rfl: 3 .  levothyroxine (SYNTHROID) 125 MCG tablet, TAKE 1 TABLET BY MOUTH  DAILY BEFORE BREAKFAST, Disp: 90 tablet, Rfl: 3 .  losartan (COZAAR) 50 MG tablet, TAKE 1 TABLET DAILY ALONG WITH 1 OF THE HCTZ 12.5MG  TABLETS, Disp: 90 tablet, Rfl: 3 .  metFORMIN (GLUCOPHAGE-XR) 500 MG 24 hr tablet, Take 3 tablets (1,500 mg total) by mouth daily with breakfast., Disp: 270 tablet, Rfl: 2 .  montelukast (SINGULAIR) 10 MG tablet, Take 1 tablet (10 mg total) by mouth at bedtime., Disp: 90 tablet, Rfl: 3 .  mycophenolate (CELLCEPT) 500 MG tablet, TAKE 2 TABLETS BY MOUTH  TWICE DAILY, Disp: 180 tablet, Rfl: 2 .  NON FORMULARY, Place 4 L into the nose daily. 10 Liters with exertion, Disp: , Rfl:  .  OneTouch Delica Lancets 36I MISC, CHECK BLOOD SUGAR ONCE DAILY., Disp: 100 each, Rfl: 3 .  ONETOUCH ULTRA test strip, CHECK BLOOD SUGAR DAILY., Disp: 50 strip, Rfl: 11 .  pantoprazole (PROTONIX) 40 MG tablet, TAKE 1 TABLET BY MOUTH  TWICE DAILY, Disp: 180 tablet, Rfl: 3 .  pioglitazone (ACTOS) 45 MG tablet, Take 1 tablet (45 mg total) by mouth daily., Disp: 90 tablet, Rfl: 3 .  pravastatin (PRAVACHOL) 40 MG tablet, TAKE 1 TABLET DAILY., Disp: 90 tablet, Rfl: 3 .  Probiotic Product (PROBIOTIC DAILY PO), Take by mouth., Disp: , Rfl:  .  verapamil (CALAN-SR) 180 MG CR tablet, TAKE 1 TABLET BY MOUTH  DAILY, Disp: 90 tablet, Rfl: 3    Observations/Objective: Blood pressure 120/60, pulse 76, temperature 97.7 F (36.5 C), temperature source Temporal, height 5' 6.5" (1.689 m), weight 233 lb 9 oz (105.9 kg), SpO2 98 %.  Physical Exam Constitutional:      General: She is not in acute distress.    Appearance: Normal appearance. She is well-developed. She is obese. She is not ill-appearing or toxic-appearing.  HENT:     Head: Normocephalic.     Right Ear: Hearing, tympanic membrane, ear canal and external ear normal. Tympanic membrane is not erythematous, retracted or bulging.     Left Ear: Hearing, tympanic  membrane, ear canal and external ear normal. Tympanic membrane is not erythematous, retracted or bulging.     Nose: No mucosal edema or rhinorrhea.     Right Sinus: No maxillary sinus tenderness or frontal sinus tenderness.     Left Sinus: No maxillary sinus tenderness or frontal sinus tenderness.     Mouth/Throat:     Pharynx: Uvula midline.  Eyes:     General: Lids are normal. Lids are everted, no foreign bodies appreciated.     Conjunctiva/sclera: Conjunctivae normal.     Pupils: Pupils are equal, round, and reactive to light.  Neck:     Thyroid: No thyroid mass or thyromegaly.     Vascular: No carotid bruit.     Trachea: Trachea normal.  Cardiovascular:     Rate and Rhythm: Normal rate and regular rhythm.     Pulses: Normal pulses.     Heart sounds: Normal heart sounds, S1 normal and S2 normal. No murmur heard.  No friction rub. No gallop.   Pulmonary:     Effort: Pulmonary effort is normal. No tachypnea or respiratory distress.     Breath sounds: Normal breath sounds. No decreased breath sounds, wheezing, rhonchi or rales.  Abdominal:     General: Bowel sounds are normal.     Palpations: Abdomen is soft.     Tenderness: There is no abdominal tenderness.  Musculoskeletal:     Cervical back: Normal range of motion and neck supple.  Skin:    General: Skin is warm and dry.     Findings: No rash.   Neurological:     Mental Status: She is alert.  Psychiatric:        Mood and Affect: Mood is not anxious or depressed.        Speech: Speech normal.        Behavior: Behavior normal. Behavior is cooperative.        Thought Content: Thought content normal.        Judgment: Judgment normal.      Assessment and Plan   Controlled diabetes mellitus type 2 with complications (H[facility]) Increase metformin to 2000 mg daily. Continue actos and farxiga. She will get back on track with diet. Counseled of lifestyle changes.  Of note A1C seems to be worsening in last 9 month .. the same time period since having to stop invokana and change to farxiga. Consider changing this med pr weaning off if she tolerates metfomin and A1C at goal.   Hypertension associated with diabetes ([place]) Well controlled. Continue current medication.      [name], MD

## 2020-04-24 NOTE — Telephone Encounter (Signed)
Last office visit 01/24/2020 for DM.  Last refilled 03/27/2020 for 30 g with no refills.  Patient is scheduled to be seen today.  Please refill if appropriate.

## 2020-04-24 NOTE — Assessment & Plan Note (Signed)
Well controlled. Continue current medication.  

## 2020-04-24 NOTE — Patient Instructions (Signed)
Increase to metformin 4 tabs daily.  Work on low [employer], and move as mucha s you can tolerate.

## 2020-05-04 ENCOUNTER — Ambulatory Visit
Admission: RE | Admit: 2020-05-04 | Discharge: 2020-05-04 | Disposition: A | Discharge status: Home / Self Care | Payer: Medicare Other | Source: Ambulatory Visit | Attending: Family Medicine | Admitting: Family Medicine

## 2020-05-04 ENCOUNTER — Other Ambulatory Visit: Payer: Self-pay

## 2020-05-04 DIAGNOSIS — J8489 Other specified interstitial pulmonary diseases: Secondary | ICD-10-CM | POA: Diagnosis not present

## 2020-05-04 DIAGNOSIS — G4733 Obstructive sleep apnea (adult) (pediatric): Secondary | ICD-10-CM | POA: Diagnosis not present

## 2020-05-04 DIAGNOSIS — Z1231 Encounter for screening mammogram for malignant neoplasm of breast: Secondary | ICD-10-CM | POA: Insufficient documentation

## 2020-05-29 ENCOUNTER — Encounter: Payer: Self-pay | Admitting: Family Medicine

## 2020-06-04 DIAGNOSIS — J8489 Other specified interstitial pulmonary diseases: Secondary | ICD-10-CM | POA: Diagnosis not present

## 2020-06-04 DIAGNOSIS — G4733 Obstructive sleep apnea (adult) (pediatric): Secondary | ICD-10-CM | POA: Diagnosis not present

## 2020-06-08 ENCOUNTER — Telehealth: Payer: Medicare Other

## 2020-06-08 ENCOUNTER — Telehealth: Payer: Self-pay

## 2020-06-08 NOTE — Chronic Care Management (AMB) (Signed)
Chronic Care Management Pharmacy Assistant   Name: Kaitlin Hamilton  MRN: [id number] DOB: 05-Feb-1951  Reason for Encounter: Disease State/ Diabetes and Hyperlipidemia  PCP : [name], MD  Patient Questions:  1.  Have you seen any other providers since your last visit? Yes, 01/24/2020- Dr [name] (PCP), 02/02/2020- Dr Vaughan Browner (Pulmonology), 04/24/2020- Dr [name] (PCP).   2.  Any changes in your medicines or health? Yes, 04/24/2020- Metformin changed to 2,000 mg daily with breakfast.   Comprehensive medication review performed; Spoke to patient regarding cholesterol  Lipid Panel    Component Value Date/Time   CHOL 208 (H) 01/17/2020 0913   TRIG 183.0 (H) 01/17/2020 0913   HDL 68.50 01/17/2020 0913   LDLCALC 102 (H) 01/17/2020 0913   LDLDIRECT 104.0 09/04/2017 0956    10-year ASCVD risk score: The 10-year ASCVD risk score [name], et al., 2013) is: 17.9%   Values used to calculate the score:     Age: 69 years     Sex: Female     Is Non-Hispanic African American: No     Diabetic: Yes     Tobacco smoker: No     Systolic Blood Pressure: 268 mmHg     Is BP treated: Yes     HDL Cholesterol: 68.5 mg/dL     Total Cholesterol: 208 mg/dL  . Current antihyperlipidemic regimen:  o Pravastatin 40 mg- 1 tablet daily . Previous antihyperlipidemic medications tried: Atorvastatin (muscle pains) . ASCVD risk enhancing conditions: age >4, DM and HTN . What recent interventions/DTPs have been made by any provider to improve Cholesterol control since last CPP Visit: Patient continues to take pravastatin daily, diet has improved. . Any recent hospitalizations or ED visits since last visit with CPP? No . What diet changes have been made to improve Cholesterol?  o Patient states she is not eating any more fried foods and has lost 7 lbs since October 2021.  . What exercise is being done to improve Cholesterol?  o Patient states she is not doing any outside exercise or activities due to  using an oxygen but she does stay active in the house with doing laundry, cooking and cleaning.   Adherence Review: Does the patient have >5 day gap between last estimated fill dates? No  Per insurance data patient is 100% adherent to Pravastatin 40 mg for Hyperlipidemia.   Allergies:   Allergies  Allergen Reactions  . Adhesive [Tape]     Redness, bruising with any adhesives- bandaids, patches, etc.   . Atovaquone Itching  . Codeine Hives and Swelling  . Demerol [Meperidine] Hives and Swelling  . Hydrocodone Nausea Only  . Trazodone And Nefazodone Itching  . Sulfa Antibiotics Rash    Medications: Outpatient Encounter Medications as of 06/08/2020  Medication Sig  . acetaminophen (TYLENOL) 500 MG tablet Take 1,000 mg by mouth every 6 (six) hours as needed for moderate pain.  . Ascorbic Acid (VITAMIN C PO) Take by mouth.  Marland Kitchen azelastine (ASTELIN) 0.1 % nasal spray Place 1 spray into both nostrils 2 (two) times daily. Use in each nostril as directed  . dapagliflozin propanediol (FARXIGA) 10 MG TABS tablet Take 1 tablet (10 mg total) by mouth daily.  . ESBRIET 801 MG TABS TAKE 1 TABLET (801MG ) BY  MOUTH THREE TIMES DAILY  WITH FOOD  . FIBER PO Take 1 tablet by mouth daily.  . fluticasone (FLONASE) 50 MCG/ACT nasal spray Place 1 spray into both nostrils daily.  . hydrochlorothiazide (  MICROZIDE) 12.5 MG capsule TAKE 1 TABLET DAILY ALONG WITH ONE OF THE LOSARTAN 50 MG TABLETS  . levothyroxine (SYNTHROID) 125 MCG tablet TAKE 1 TABLET BY MOUTH  DAILY BEFORE BREAKFAST  . losartan (COZAAR) 50 MG tablet TAKE 1 TABLET DAILY ALONG WITH 1 OF THE HCTZ 12.5MG  TABLETS  . metFORMIN (GLUCOPHAGE-XR) 500 MG 24 hr tablet Take 4 tablets (2,000 mg total) by mouth daily with breakfast.  . montelukast (SINGULAIR) 10 MG tablet Take 1 tablet (10 mg total) by mouth at bedtime.  . mycophenolate (CELLCEPT) 500 MG tablet TAKE 2 TABLETS BY MOUTH  TWICE DAILY  . NON FORMULARY Place 4 L into the nose daily. 10 Liters  with exertion  . nystatin cream (MYCOSTATIN) Apply 1 application topically 3 (three) times daily.  [name] Delica Lancets 01S MISC CHECK BLOOD SUGAR ONCE DAILY.  Marland Kitchen ONETOUCH ULTRA test strip CHECK BLOOD SUGAR DAILY.  . pantoprazole (PROTONIX) 40 MG tablet TAKE 1 TABLET BY MOUTH  TWICE DAILY  . pioglitazone (ACTOS) 45 MG tablet Take 1 tablet (45 mg total) by mouth daily.  . pravastatin (PRAVACHOL) 40 MG tablet TAKE 1 TABLET DAILY.  . Probiotic Product (PROBIOTIC DAILY PO) Take by mouth.  . verapamil (CALAN-SR) 180 MG CR tablet TAKE 1 TABLET BY MOUTH  DAILY   No facility-administered encounter medications on file as of 06/08/2020.    Current Diagnosis: Patient Active Problem List   Diagnosis Date Noted  . Nausea vomiting and diarrhea 02/21/2019  . Controlled diabetes mellitus type 2 with complications ([place]) 07/29/3233  . MDD (recurrent major depressive disorder) in remission ([place]) 12/08/2017  . Chronic insomnia 12/08/2017  . Candidal intertrigo 07/08/2016  . Right sciatic nerve pain 03/09/2015  . Chronic pain of left thumb 03/09/2015  . Allergic rhinitis 10/06/2014  . Chronic hypoxemic respiratory failure ([place]) 01/31/2014  . Cystocele 11/11/2013  . Hypertension associated with diabetes ([place]) 08/25/2013  . Hyperlipidemia associated with type 2 diabetes mellitus ([place]) 08/25/2013  . Common migraine 08/25/2013  . Hypothyroidism 08/25/2013  . OSA (obstructive sleep apnea) 08/22/2013  . GERD (gastroesophageal reflux disease) 06/01/2013  . Esophageal dysmotility 05/17/2013  . UIP (usual interstitial pneumonitis) ([place]) 10/19/2012   Recent Relevant Labs: Lab Results  Component Value Date/Time   HGBA1C 7.6 (A) 04/24/2020 11:21 AM   HGBA1C 7.3 (H) 01/17/2020 09:13 AM   HGBA1C 6.7 (H) 07/12/2019 08:58 AM   MICROALBUR 5.1 (H) 11/08/2013 08:58 AM    Kidney Function Lab Results  Component Value Date/Time   CREATININE 0.65 01/17/2020 09:13 AM   CREATININE 0.78 07/17/2019 02:05 PM    CREATININE 1.07 07/26/2014 12:29 PM   GFR 90.34 01/17/2020 09:13 AM   GFRNONAA >60 07/17/2019 02:05 PM   GFRNONAA 55 (L) 07/26/2014 12:29 PM   GFRAA >60 07/17/2019 02:05 PM   GFRAA >60 07/26/2014 12:29 PM    . Current antihyperglycemic regimen:  o Farxiga 10 mg- 1 tablet daily o Pioglitazone 45 mg- 1 tablet daily o Metformin 500 mg- 4 tablets twice daily with food. . What recent interventions/DTPs have been made to improve glycemic control:  o Patient recent increase on Metformin by PCP, she is taking 4 tablets daily of Metformin 500 mg, totaling 2,000 mg a day.  . Have there been any recent hospitalizations or ED visits since last visit with CPP? No . Patient denies hypoglycemic symptoms, including Pale, Sweaty, Shaky, Hungry and Vision changes . Patient denies hyperglycemic symptoms, including blurry vision, excessive thirst, fatigue, polyuria and weakness . How often are  you checking your blood sugar? once daily . What are your blood sugars ranging?  o Fasting: 164, 176, 123, 171, 130, 106, 178, 110, 114 (readings from 11/10-11/19) o Before meals: n/a o After meals: n/a o Bedtime: n/a . During the week, how often does your blood glucose drop below 70? Never . Are you checking your feet daily/regularly? Yes- Patient has not noticed any cuts or bruises on feet or toes.   Adherence Review: Is the patient currently on a STATIN medication? Yes- Pravastatin 40 mg Is the patient currently on ACE/ARB medication? Yes- Losartan 50 mg Does the patient have >5 day gap between last estimated fill dates? No  Per insurance data patient is 100% adherent to Farxiga 10 mg and Metformin 500 mg for Diabetes.    Goals Addressed            This Visit's Progress   . Pharmacy Care Plan   On track    CARE PLAN ENTRY  Current Barriers:  . Chronic Disease Management support, education, and care coordination needs related to hypertension, hyperlipidemia, diabetes  Pharmacist Clinical Goal(s):   Marland Kitchen Cholesterol: Improve LDL cholesterol within goal of < 100 (previous LDL: 134) . Diabetes: Maintain diabetes control with A1c < 7% and prevent hypoglycemia (blood glucose < 70 mg/dL). . Hypertension: Maintain blood pressure within goal of < 140/90 mmHg. . Vaccinations: Remain up to date on vaccinations. Recommend updating tetanus booster this year (last Tdap 07/2009).  Interventions: . Comprehensive medication review performed . Reassess cholesterol after labs in June 2021  Patient Self Care Activities:  Over the next 6 months until follow up visit: . Continue monitoring blood pressure with symptoms and once monthly, document, and provide at future appointments. . Continue monitoring blood glucose once daily. Recommend varying the time of day to see if blood glucose is controlled 1-2 hours after meals and at bedtime. Continue to document and provide at future appointments. Call with any episodes of low blood glucose (< 70 mg/dL). . Limit cholesterol content in foods. . Check with local pharmacy for tetanus vaccine.  Goals updated, see past updates for previous goals        Follow-Up:  Pharmacist Review  Patient is taking medications as prescribed and doing well, no complaints or concerns. Patient has lost 7 lbs since October, change in diet has helped. Debbora Dus, CPP notified.  Pattricia Boss, [place] Pharmacist Assistant [phone]

## 2020-06-12 ENCOUNTER — Telehealth: Payer: Self-pay

## 2020-06-12 NOTE — Telephone Encounter (Signed)
New message    Patient had a friend in her home for 30 minutes last night the friend went to work this morning two co-worker tested + COVID.   Please advise on precaution   Patient voiced no symptoms

## 2020-06-13 NOTE — Telephone Encounter (Signed)
Kaitlin Hamilton notified as instructed by telephone.  Phone number for [facility] given to patient.

## 2020-06-13 NOTE — Telephone Encounter (Signed)
Have COVID testing in 4 days, quarantine if symptoms.  Start vit D 1000 IU daily.

## 2020-06-16 ENCOUNTER — Other Ambulatory Visit: Payer: Medicare Other

## 2020-06-16 DIAGNOSIS — Z20822 Contact with and (suspected) exposure to covid-19: Secondary | ICD-10-CM

## 2020-06-17 LAB — NOVEL CORONAVIRUS, NAA: SARS-CoV-2, NAA: NOT DETECTED

## 2020-06-17 LAB — SARS-COV-2, NAA 2 DAY TAT

## 2020-06-20 DIAGNOSIS — J849 Interstitial pulmonary disease, unspecified: Secondary | ICD-10-CM

## 2020-06-20 NOTE — Telephone Encounter (Signed)
PCC's please advise   Good morning.  We received the schedule for my upcoming CT scan  at the [facility] location.  Could you please direct the CT scan order to the [facility] location for scheduling as that would be considerably more convenient for me?  The scheduler noted that the [facility] location wouldn't/couldn't schedule the CT if the order wasn't directed to their particular facility.    Much Appreciation.

## 2020-07-02 ENCOUNTER — Ambulatory Visit: Admission: RE | Admit: 2020-07-02 | Payer: Medicare Other | Source: Ambulatory Visit

## 2020-07-02 ENCOUNTER — Other Ambulatory Visit: Payer: Self-pay

## 2020-07-02 NOTE — Telephone Encounter (Signed)
We can hold off CT scan for now since she has lung function test scheduled for January anyway.  If there is worsening then we will reorder the CT scan  Please make follow-up in clinic with me after PFTs.

## 2020-07-02 NOTE — Telephone Encounter (Signed)
Patient's family member sent email   Good morning.  My mother, Kaitlin Hamilton arrived at her CT scan appointment this morning and the technician reminded her that she had a CT scan 15months ago.  She did not complete the scan today.  I was not able to see the scan that she had January of this year in [place].   I believe the technician has sent the results to you again since it is not in her chart.  Forgive my memory, but is an updated CT scan needed right now since she is clinically about the same?  The technician said it was no problem rescheduling it and we are happy to do so.    Thank you,  [name]   Dr. Vaughan Browner please advise on CT and when it needs to be done.

## 2020-07-04 DIAGNOSIS — J8489 Other specified interstitial pulmonary diseases: Secondary | ICD-10-CM | POA: Diagnosis not present

## 2020-07-04 DIAGNOSIS — G4733 Obstructive sleep apnea (adult) (pediatric): Secondary | ICD-10-CM | POA: Diagnosis not present

## 2020-07-09 ENCOUNTER — Other Ambulatory Visit: Payer: Medicare Other

## 2020-07-10 ENCOUNTER — Telehealth: Payer: Self-pay | Admitting: Family Medicine

## 2020-07-10 ENCOUNTER — Other Ambulatory Visit: Payer: Self-pay | Admitting: Pulmonary Disease

## 2020-07-10 DIAGNOSIS — E118 Type 2 diabetes mellitus with unspecified complications: Secondary | ICD-10-CM

## 2020-07-10 DIAGNOSIS — E039 Hypothyroidism, unspecified: Secondary | ICD-10-CM

## 2020-07-10 NOTE — Telephone Encounter (Signed)
-----   Message from Ellamae Sia sent at 07/09/2020  3:11 PM EST ----- Regarding: Lab orders for Thursday, 1.6.22 Patient is scheduled for CPX labs, please order future labs, Thanks , Karna Christmas

## 2020-07-26 ENCOUNTER — Other Ambulatory Visit (INDEPENDENT_AMBULATORY_CARE_PROVIDER_SITE_OTHER): Payer: Medicare Other

## 2020-07-26 ENCOUNTER — Ambulatory Visit: Payer: Medicare Other

## 2020-07-26 ENCOUNTER — Other Ambulatory Visit: Payer: Self-pay

## 2020-07-26 DIAGNOSIS — E118 Type 2 diabetes mellitus with unspecified complications: Secondary | ICD-10-CM | POA: Diagnosis not present

## 2020-07-26 DIAGNOSIS — E039 Hypothyroidism, unspecified: Secondary | ICD-10-CM | POA: Diagnosis not present

## 2020-07-26 LAB — LIPID PANEL
Cholesterol: 190 mg/dL (ref 0–200)
HDL: 76.7 mg/dL (ref >39.00)
LDL Cholesterol: 77 mg/dL (ref 0–99)
NonHDL: 113.54
Total CHOL/HDL Ratio: 2
Triglycerides: 182 mg/dL — ABNORMAL HIGH (ref 0.0–149.0)
VLDL: 36.4 mg/dL (ref 0.0–40.0)

## 2020-07-26 LAB — T3, FREE: T3, Free: 2.9 pg/mL (ref 2.3–4.2)

## 2020-07-26 LAB — COMPREHENSIVE METABOLIC PANEL
ALT: 8 U/L (ref 0–35)
AST: 11 U/L (ref 0–37)
Albumin: 4.9 g/dL (ref 3.5–5.2)
Alkaline Phosphatase: 68 U/L (ref 39–117)
BUN: 14 mg/dL (ref 6–23)
CO2: 32 mEq/L (ref 19–32)
Calcium: 10.4 mg/dL (ref 8.4–10.5)
Chloride: 95 mEq/L — ABNORMAL LOW (ref 96–112)
Creatinine, Ser: 0.72 mg/dL (ref 0.40–1.20)
GFR: 85.17 mL/min (ref >60.00)
Glucose, Bld: 129 mg/dL — ABNORMAL HIGH (ref 70–99)
Potassium: 4.3 mEq/L (ref 3.5–5.1)
Sodium: 137 mEq/L (ref 135–145)
Total Bilirubin: 0.5 mg/dL (ref 0.2–1.2)
Total Protein: 7.6 g/dL (ref 6.0–8.3)

## 2020-07-26 LAB — HEMOGLOBIN A1C: Hgb A1c MFr Bld: 6.7 % — ABNORMAL HIGH (ref 4.6–6.5)

## 2020-07-26 LAB — TSH: TSH: 2.03 u[IU]/mL (ref 0.35–4.50)

## 2020-07-26 LAB — T4, FREE: Free T4: 1.16 ng/dL (ref 0.60–1.60)

## 2020-07-26 NOTE — Progress Notes (Signed)
No critical labs need to be addressed urgently. We will discuss labs in detail at upcoming office visit.   

## 2020-07-31 ENCOUNTER — Other Ambulatory Visit: Payer: Medicare Other

## 2020-07-31 ENCOUNTER — Encounter: Payer: Medicare Other | Admitting: Family Medicine

## 2020-07-31 ENCOUNTER — Other Ambulatory Visit: Payer: Self-pay

## 2020-07-31 DIAGNOSIS — Z20822 Contact with and (suspected) exposure to covid-19: Secondary | ICD-10-CM | POA: Diagnosis not present

## 2020-08-01 LAB — SARS-COV-2, NAA 2 DAY TAT

## 2020-08-01 LAB — NOVEL CORONAVIRUS, NAA: SARS-CoV-2, NAA: NOT DETECTED

## 2020-08-02 ENCOUNTER — Ambulatory Visit: Admission: RE | Admit: 2020-08-02 | Payer: Medicare Other | Source: Ambulatory Visit

## 2020-08-03 ENCOUNTER — Ambulatory Visit: Payer: Medicare Other

## 2020-08-03 ENCOUNTER — Other Ambulatory Visit: Payer: Self-pay | Admitting: Family Medicine

## 2020-08-04 ENCOUNTER — Other Ambulatory Visit: Payer: Self-pay

## 2020-08-04 ENCOUNTER — Other Ambulatory Visit: Payer: Medicare Other

## 2020-08-04 DIAGNOSIS — J8489 Other specified interstitial pulmonary diseases: Secondary | ICD-10-CM | POA: Diagnosis not present

## 2020-08-04 DIAGNOSIS — Z20822 Contact with and (suspected) exposure to covid-19: Secondary | ICD-10-CM

## 2020-08-04 DIAGNOSIS — G4733 Obstructive sleep apnea (adult) (pediatric): Secondary | ICD-10-CM | POA: Diagnosis not present

## 2020-08-07 ENCOUNTER — Ambulatory Visit: Payer: Medicare Other | Admitting: Pulmonary Disease

## 2020-08-07 LAB — NOVEL CORONAVIRUS, NAA: SARS-CoV-2, NAA: NOT DETECTED

## 2020-08-15 ENCOUNTER — Telehealth: Payer: Self-pay

## 2020-08-15 NOTE — Chronic Care Management (AMB) (Addendum)
Chronic Care Management Pharmacy Assistant   Name: Kaitlin Hamilton  MRN: [id number] DOB: 12-14-1950  Reason for Encounter: Disease State- Diabetes and Hyperlipidemia  Patient Questions:  1.  Have you seen any other providers since your last visit? No  2.  Any changes in your medicines or health? No    PCP : [name], MD  Allergies:   Allergies  Allergen Reactions   Adhesive [Tape]     Redness, bruising with any adhesives- bandaids, patches, etc.    Atovaquone Itching   Codeine Hives and Swelling   Demerol [Meperidine] Hives and Swelling   Hydrocodone Nausea Only   Trazodone And Nefazodone Itching   Sulfa Antibiotics Rash    Medications: Outpatient Encounter Medications as of 08/15/2020  Medication Sig   acetaminophen (TYLENOL) 500 MG tablet Take 1,000 mg by mouth every 6 (six) hours as needed for moderate pain.   Ascorbic Acid (VITAMIN C PO) Take by mouth.   azelastine (ASTELIN) 0.1 % nasal spray Place 1 spray into both nostrils 2 (two) times daily. Use in each nostril as directed   dapagliflozin propanediol (FARXIGA) 10 MG TABS tablet Take 1 tablet (10 mg total) by mouth daily.   ESBRIET 801 MG TABS TAKE 1 TABLET (801MG ) BY  MOUTH THREE TIMES DAILY  WITH FOOD   FIBER PO Take 1 tablet by mouth daily.   fluticasone (FLONASE) 50 MCG/ACT nasal spray Place 1 spray into both nostrils daily.   hydrochlorothiazide (MICROZIDE) 12.5 MG capsule TAKE 1 TABLET DAILY ALONG WITH ONE OF THE LOSARTAN 50 MG TABLETS   levothyroxine (SYNTHROID) 125 MCG tablet TAKE 1 TABLET BY MOUTH  DAILY BEFORE BREAKFAST   losartan (COZAAR) 50 MG tablet TAKE 1 TABLET DAILY ALONG WITH 1 OF THE HCTZ 12.5MG  TABLETS   metFORMIN (GLUCOPHAGE-XR) 500 MG 24 hr tablet Take 4 tablets (2,000 mg total) by mouth daily with breakfast.   montelukast (SINGULAIR) 10 MG tablet Take 1 tablet (10 mg total) by mouth at bedtime.   mycophenolate (CELLCEPT) 500 MG tablet TAKE 2 TABLETS BY MOUTH  TWICE DAILY   NON FORMULARY  Place 4 L into the nose daily. 10 Liters with exertion   nystatin cream (MYCOSTATIN) Apply 1 application topically 3 (three) times daily.   OneTouch Delica Lancets 76E MISC CHECK BLOOD SUGAR ONCE DAILY.   ONETOUCH ULTRA test strip CHECK BLOOD SUGAR DAILY.   pantoprazole (PROTONIX) 40 MG tablet TAKE 1 TABLET BY MOUTH  TWICE DAILY   pioglitazone (ACTOS) 45 MG tablet Take 1 tablet (45 mg total) by mouth daily.   pravastatin (PRAVACHOL) 40 MG tablet TAKE 1 TABLET DAILY.   Probiotic Product (PROBIOTIC DAILY PO) Take by mouth.   verapamil (CALAN-SR) 180 MG CR tablet TAKE 1 TABLET BY MOUTH  DAILY   No facility-administered encounter medications on file as of 08/15/2020.    Current Diagnosis: Patient Active Problem List   Diagnosis Date Noted   Nausea vomiting and diarrhea 02/21/2019   Controlled diabetes mellitus type 2 with complications ([place]) 83/15/1761   MDD (recurrent major depressive disorder) in remission ([place]) 12/08/2017   Chronic insomnia 12/08/2017   Candidal intertrigo 07/08/2016   Right sciatic nerve pain 03/09/2015   Chronic pain of left thumb 03/09/2015   Allergic rhinitis 10/06/2014   Chronic hypoxemic respiratory failure ([place]) 01/31/2014   Cystocele 11/11/2013   Hypertension associated with diabetes ([place]) 08/25/2013   Hyperlipidemia associated with type 2 diabetes mellitus ([place]) 08/25/2013   Common migraine 08/25/2013   Hypothyroidism  08/25/2013   OSA (obstructive sleep apnea) 08/22/2013   GERD (gastroesophageal reflux disease) 06/01/2013   Esophageal dysmotility 05/17/2013   UIP (usual interstitial pneumonitis) ([place]) 10/19/2012   Recent Relevant Labs: Lab Results  Component Value Date/Time   HGBA1C 6.7 (H) 07/26/2020 11:18 AM   HGBA1C 7.6 (A) 04/24/2020 11:21 AM   HGBA1C 7.3 (H) 01/17/2020 09:13 AM   MICROALBUR 5.1 (H) 11/08/2013 08:58 AM    Kidney Function Lab Results  Component Value Date/Time   CREATININE 0.72 07/26/2020 11:18 AM   CREATININE 0.65 01/17/2020  09:13 AM   CREATININE 1.07 07/26/2014 12:29 PM   GFR 85.17 07/26/2020 11:18 AM   GFRNONAA >60 07/17/2019 02:05 PM   GFRNONAA 55 (L) 07/26/2014 12:29 PM   GFRAA >60 07/17/2019 02:05 PM   GFRAA >60 07/26/2014 12:29 PM    Current antihyperglycemic regimen:  Farxiga 10 mg - 1 tablet daily Pioglitazone 45 mg - 1 tablet daily  Metformin 500 mg - 4 tablets daily with breakfast  What recent interventions/DTPs have been made to improve glycemic control:  Metformin increased to 2000 mg since last visit with CPP  Have there been any recent hospitalizations or ED visits since last visit with CPP? No   Patient denies hypoglycemic symptoms, including Pale, Sweaty, Shaky, Hungry, Nervous/irritable and Vision changes   Patient denies hyperglycemic symptoms, including blurry vision, excessive thirst, fatigue, polyuria and weakness   How often are you checking your blood sugar? once daily   What are your blood sugars ranging?  Fasting:  08/04/20- 180 08/05/20- 150 08/06/20- 137 08/07/20- 127 08/08/20- 123 08/09/20- 121 08/10/20- 103 08/11/20- 112 08/12/20- 117 08/13/20- 144 08/14/20- 148 08/15/20- 144  Before meals: N/A After meals: N/A Bedtime: N/A  During the week, how often does your blood glucose drop below 70? Never   Are you checking your feet daily/regularly? Yes, feet look good. Denies any wounds or sores.   Adherence Review: Is the patient currently on a STATIN medication? Yes Is the patient currently on ACE/ARB medication? Yes Does the patient have >5 day gap between last estimated fill dates? CPP to review  Lipid Panel    Component Value Date/Time   CHOL 190 07/26/2020 1118   TRIG 182.0 (H) 07/26/2020 1118   HDL 76.70 07/26/2020 1118   LDLCALC 77 07/26/2020 1118   LDLDIRECT 104.0 09/04/2017 0956    10-year ASCVD risk score: The 10-year ASCVD risk score [name], et al., 2013) is: 16.9%   Values used to calculate the score:     Age: 70 years     Sex: Female     Is  Non-Hispanic African American: No     Diabetic: Yes     Tobacco smoker: No     Systolic Blood Pressure: 254 mmHg     Is BP treated: Yes     HDL Cholesterol: 76.7 mg/dL     Total Cholesterol: 190 mg/dL  Current antihyperlipidemic regimen:  Pravastatin 40 mg - 1 tablet daily (evening)   Previous antihyperlipidemic medications tried: Atorvastatin- muscle pain  ASCVD risk enhancing conditions: age >34, DM and HTN   What recent interventions/DTPs have been made by any provider to improve Cholesterol control since last CPP Visit: Limiting red meat and foods that contain cholesterol.  Any recent hospitalizations or ED visits since last visit with CPP? No   What diet changes have been made to improve Cholesterol?  No diet changes. States she only really eats 1 meal a day. Usually limits meats and eats  some sort of vegetable such as corn, sweet potatoes or butter beans.   What exercise is being done to improve Cholesterol?  States she does not do any formal exercise but cleans up around the house.   Adherence Review: Does the patient have >5 day gap between last estimated fill dates? CPP to review    Patient states she weighs daily and weight was 223 today. She has not left the house much recently due to Covid risks. Patient states the only exercise she gets is cleaning up around the house but does not do that often as it makes her out of breath and she has to turn up her oxygen.    Patient does have an appointment with Dr. [name] 08/21/20 that she is aware of.   Follow-Up:  Pharmacist Review   Debbora Dus, CPP notified  Margaretmary Dys, [place] [phone]  Refills on all diabetes, cholesterol, and BP meds are very timely. No gap > 5 days. Fasting BG mostly within goal. No changes needed at this time.  Debbora Dus, PharmD Clinical Pharmacist [facility] [phone]

## 2020-08-17 ENCOUNTER — Other Ambulatory Visit: Payer: Self-pay | Admitting: Pulmonary Disease

## 2020-08-21 ENCOUNTER — Encounter: Payer: Self-pay | Admitting: Family Medicine

## 2020-08-21 ENCOUNTER — Other Ambulatory Visit: Payer: Self-pay

## 2020-08-21 ENCOUNTER — Ambulatory Visit (INDEPENDENT_AMBULATORY_CARE_PROVIDER_SITE_OTHER): Payer: Medicare Other | Admitting: Family Medicine

## 2020-08-21 VITALS — BP 110/64 | HR 95 | Temp 97.9°F | Ht 65.5 in | Wt 228.0 lb

## 2020-08-21 DIAGNOSIS — I152 Hypertension secondary to endocrine disorders: Secondary | ICD-10-CM

## 2020-08-21 DIAGNOSIS — F3340 Major depressive disorder, recurrent, in remission, unspecified: Secondary | ICD-10-CM

## 2020-08-21 DIAGNOSIS — J84112 Idiopathic pulmonary fibrosis: Secondary | ICD-10-CM | POA: Diagnosis not present

## 2020-08-21 DIAGNOSIS — E039 Hypothyroidism, unspecified: Secondary | ICD-10-CM | POA: Diagnosis not present

## 2020-08-21 DIAGNOSIS — E1159 Type 2 diabetes mellitus with other circulatory complications: Secondary | ICD-10-CM | POA: Diagnosis not present

## 2020-08-21 DIAGNOSIS — E785 Hyperlipidemia, unspecified: Secondary | ICD-10-CM

## 2020-08-21 DIAGNOSIS — E1169 Type 2 diabetes mellitus with other specified complication: Secondary | ICD-10-CM

## 2020-08-21 DIAGNOSIS — J9611 Chronic respiratory failure with hypoxia: Secondary | ICD-10-CM | POA: Diagnosis not present

## 2020-08-21 DIAGNOSIS — E118 Type 2 diabetes mellitus with unspecified complications: Secondary | ICD-10-CM | POA: Diagnosis not present

## 2020-08-21 DIAGNOSIS — Z0000 Encounter for general adult medical examination without abnormal findings: Secondary | ICD-10-CM

## 2020-08-21 LAB — HM DIABETES FOOT EXAM

## 2020-08-21 NOTE — Assessment & Plan Note (Signed)
Stable, chronic.  Continue current medication.    

## 2020-08-21 NOTE — Assessment & Plan Note (Signed)
Improved control of mood.

## 2020-08-21 NOTE — Assessment & Plan Note (Signed)
Excellent improvement in control with max metfomrin ( stable normal GFR) farxiga and actos.

## 2020-08-21 NOTE — Assessment & Plan Note (Signed)
On esbriet. Followed by pulmonary.

## 2020-08-21 NOTE — Patient Instructions (Signed)
Keep up great work on healthy eating and try to get some exercise as tolerated.

## 2020-08-21 NOTE — Assessment & Plan Note (Signed)
Stable on continuous oxygen

## 2020-08-21 NOTE — Progress Notes (Signed)
Patient ID: Kaitlin Hamilton, female    DOB: [birth year], 70 y.o.   MRN: [id number]  This visit was conducted in person.  BP 110/64   Pulse 95   Temp 97.9 F (36.6 C) (Temporal)   Ht 5' 5.5" (1.664 m)   Wt 228 lb (103.4 kg)   SpO2 97% Comment: 4L O2  BMI 37.36 kg/m    CC: Chief Complaint  Patient presents with  . Medicare Wellness    Subjective:   HPI: Kaitlin Hamilton is a 70 y.o. female presenting on 08/21/2020 for annual medicare wellness, complete physical and review of chronic health problems. He/She also has the following acute concerns today:  I have personally reviewed the Medicare Annual Wellness questionnaire and have noted 1. The patient's medical and social history 2. Their use of alcohol, tobacco or illicit drugs 3. Their current medications and supplements 4. The patient's functional ability including ADL's, fall risks, home safety risks and hearing or visual             impairment. 5. Diet and physical activities 6. Evidence for depression or mood disorders 7.         Updated provider list Cognitive evaluation was performed and recorded on pt medicare questionnaire form. The patients weight, height, BMI and visual acuity have been recorded in the chart  I have made referrals, counseling and provided education to the patient based review of the above and I have provided the pt with a written personalized care plan for preventive services.   Documentation of this information was scanned into the electronic record under the media tab.   Advance directives and end of life planning reviewed in detail with patient and documented in EMR. Patient given handout on advance care directives if needed. HCPOA and living will updated if needed.   Hearing Screening   Method: Audiometry   125Hz  250Hz  500Hz  1000Hz  2000Hz  3000Hz  4000Hz  6000Hz  8000Hz   Right ear:   40 40 40  0    Left ear:   20 40 25  0    Vision Screening Comments: Wears Glasses-Eye Exam 03/2020 at [facility]  08/21/2020 07/12/2019 02/04/2019 07/08/2018 06/02/2017  Falls in the past year? 0 0 0 0 No  Number falls in past yr: - 0 - - -  Injury with Fall? - 0 - - -  Risk for fall due to : - Medication side effect - - -  Follow up - Falls evaluation completed;Falls prevention discussed Falls evaluation completed - -     MDD PHQ 9 today  9 improved   Diabetes:  Improved control  On 4 tablets daily, farxiga and actos Lab Results  Component Value Date   HGBA1C 6.7 (H) 07/26/2020  Using medications without difficulties: occ diarrhea but tolerable Hypoglycemic episodes:none Hyperglycemic episodes:none Feet problems: no ulcer Blood Sugars averaging: FBS 113-136 eye exam within last year: yes   [facility]) Care Management is working in partnership with you to provide your patient with Disease Management, Transition of Care, Complex Care Management, and Wellness programs.           Elevated Cholesterol:  LDLa t goal on statin Lab Results  Component Value Date   CHOL 190 07/26/2020   HDL 76.70 07/26/2020   LDLCALC 77 07/26/2020   LDLDIRECT 104.0 09/04/2017   TRIG 182.0 (H) 07/26/2020   CHOLHDL 2 07/26/2020    Hypertension:    BP Readings from Last 3 Encounters:  08/21/20 110/64  04/24/20 120/60  02/02/20 124/70  Using medication without problems or lightheadedness: none Chest pain with exertion:none Edema:none Short of breath: stable Average home BPs: Other issues:   Hypothyroid  Lab Results  Component Value Date   TSH 2.03 07/26/2020   UIP with chronic respiratory failure on continuous oxygen  Patient Care Team: [name], MD as PCP - General (Family Medicine) Juanito Doom, MD as Consulting Physician (Pulmonary Disease) Debbora Dus, [facility] as Pharmacist (Pharmacist)      Relevant past medical, surgical, family and social history reviewed and updated as indicated. Interim medical history since our last visit reviewed. Allergies and  medications reviewed and updated. Outpatient Medications Prior to Visit  Medication Sig Dispense Refill  . acetaminophen (TYLENOL) 500 MG tablet Take 1,000 mg by mouth every 6 (six) hours as needed for moderate pain.    . Ascorbic Acid (VITAMIN C PO) Take by mouth.    . dapagliflozin propanediol (FARXIGA) 10 MG TABS tablet Take 1 tablet (10 mg total) by mouth daily. 90 tablet 3  . ESBRIET 801 MG TABS TAKE 1 TABLET (801MG ) BY  MOUTH THREE TIMES DAILY  WITH FOOD 270 tablet 6  . FIBER PO Take 1 tablet by mouth daily.    . fluticasone (FLONASE) 50 MCG/ACT nasal spray Place 1 spray into both nostrils daily.    . hydrochlorothiazide (MICROZIDE) 12.5 MG capsule TAKE 1 TABLET DAILY ALONG WITH ONE OF THE LOSARTAN 50 MG TABLETS 90 capsule 3  . levothyroxine (SYNTHROID) 125 MCG tablet TAKE 1 TABLET BY MOUTH  DAILY BEFORE BREAKFAST 90 tablet 3  . losartan (COZAAR) 50 MG tablet TAKE 1 TABLET DAILY ALONG WITH 1 OF THE HCTZ 12.5MG  TABLETS 90 tablet 3  . metFORMIN (GLUCOPHAGE-XR) 500 MG 24 hr tablet Take 4 tablets (2,000 mg total) by mouth daily with breakfast. 360 tablet 3  . montelukast (SINGULAIR) 10 MG tablet Take 1 tablet (10 mg total) by mouth at bedtime. 90 tablet 3  . mycophenolate (CELLCEPT) 500 MG tablet TAKE 2 TABLETS BY MOUTH  TWICE DAILY 180 tablet 7  . NON FORMULARY Place 4 L into the nose daily. 10 Liters with exertion    . nystatin cream (MYCOSTATIN) Apply 1 application topically 3 (three) times daily. 30 g 0  . OneTouch Delica Lancets 99991111 MISC CHECK BLOOD SUGAR ONCE DAILY. 100 each 3  . ONETOUCH ULTRA test strip CHECK BLOOD SUGAR DAILY. 50 strip 5  . pantoprazole (PROTONIX) 40 MG tablet TAKE 1 TABLET BY MOUTH  TWICE DAILY 180 tablet 3  . pioglitazone (ACTOS) 45 MG tablet Take 1 tablet (45 mg total) by mouth daily. 90 tablet 3  . pravastatin (PRAVACHOL) 40 MG tablet TAKE 1 TABLET DAILY. 90 tablet 3  . Probiotic Product (PROBIOTIC DAILY PO) Take by mouth.    . verapamil (CALAN-SR) 180 MG CR  tablet TAKE 1 TABLET BY MOUTH  DAILY 90 tablet 3  . azelastine (ASTELIN) 0.1 % nasal spray Place 1 spray into both nostrils 2 (two) times daily. Use in each nostril as directed 30 mL 1   No facility-administered medications prior to visit.     Per HPI unless specifically indicated in ROS section below Review of Systems  Constitutional: Negative for fatigue and fever.  HENT: Negative for congestion.   Eyes: Negative for pain.  Respiratory: Positive for shortness of breath. Negative for cough.   Cardiovascular: Negative for chest pain, palpitations and leg swelling.  Gastrointestinal: Negative for abdominal pain.  Genitourinary:  Negative for dysuria and vaginal bleeding.  Musculoskeletal: Negative for back pain.  Neurological: Negative for syncope, light-headedness and headaches.  Psychiatric/Behavioral: Negative for dysphoric mood.   Objective:  BP 110/64   Pulse 95   Temp 97.9 F (36.6 C) (Temporal)   Ht 5' 5.5" (1.664 m)   Wt 228 lb (103.4 kg)   SpO2 97% Comment: 4L O2  BMI 37.36 kg/m   Wt Readings from Last 3 Encounters:  08/21/20 228 lb (103.4 kg)  04/24/20 233 lb 9 oz (105.9 kg)  02/02/20 229 lb (103.9 kg)      Physical Exam Constitutional:      General: She is not in acute distress.    Appearance: Normal appearance. She is well-developed. She is obese. She is not ill-appearing or toxic-appearing.  HENT:     Head: Normocephalic.     Right Ear: Hearing, tympanic membrane, ear canal and external ear normal. Tympanic membrane is not erythematous, retracted or bulging.     Left Ear: Hearing, tympanic membrane, ear canal and external ear normal. Tympanic membrane is not erythematous, retracted or bulging.     Nose: No mucosal edema or rhinorrhea.     Right Sinus: No maxillary sinus tenderness or frontal sinus tenderness.     Left Sinus: No maxillary sinus tenderness or frontal sinus tenderness.     Mouth/Throat:     Pharynx: Uvula midline.  Eyes:     General: Lids are  normal. Lids are everted, no foreign bodies appreciated.     Conjunctiva/sclera: Conjunctivae normal.     Pupils: Pupils are equal, round, and reactive to light.  Neck:     Thyroid: No thyroid mass or thyromegaly.     Vascular: No carotid bruit.     Trachea: Trachea normal.  Cardiovascular:     Rate and Rhythm: Normal rate and regular rhythm.     Pulses: Normal pulses.     Heart sounds: Normal heart sounds, S1 normal and S2 normal. No murmur heard. No friction rub. No gallop.   Pulmonary:     Effort: Pulmonary effort is normal. No tachypnea or respiratory distress.     Breath sounds: Rales present. No decreased breath sounds or wheezing.  Abdominal:     General: Bowel sounds are normal.     Palpations: Abdomen is soft.     Tenderness: There is no abdominal tenderness.  Musculoskeletal:     Cervical back: Normal range of motion and neck supple.  Skin:    General: Skin is warm and dry.     Findings: No rash.  Neurological:     Mental Status: She is alert.  Psychiatric:        Mood and Affect: Mood is not anxious or depressed.        Speech: Speech normal.        Behavior: Behavior normal. Behavior is cooperative.        Thought Content: Thought content normal.        Judgment: Judgment normal.       Diabetic foot exam: Normal inspection No skin breakdown No calluses  Normal DP pulses Normal sensation to light touch and monofilament Nails normal  Results for orders placed or performed in visit on 08/04/20  Novel Coronavirus, NAA ([facility])   Specimen: Nasopharyngeal(NP) swabs in vial transport medium   Nasopharynge  Screenin  Result Value Ref Range   SARS-CoV-2, NAA Not Detected Not Detected    This visit occurred during the SARS-CoV-2 public health emergency.  Safety  protocols were in place, including screening questions prior to the visit, additional usage of staff PPE, and extensive cleaning of exam room while observing appropriate contact time as indicated for  disinfecting solutions.   COVID 19 screen:  No recent travel or known exposure to COVID19 The patient denies respiratory symptoms of COVID 19 at this time. The importance of social distancing was discussed today.   Assessment and Plan The patient's preventative maintenance and recommended screening tests for an annual wellness exam were reviewed in full today. Brought up to date unless services declined.  Counselled on the importance of diet, exercise, and its role in overall health and mortality. The patient's FH and SH was reviewed, including their home life, tobacco status, and drug and alcohol status.    Vaccines: Uptodate with PNA, flu, COVID x 3  Not a candidate for  shingles vaccine per pulm. Colon: 2014, Dr. Hilarie Fredrickson, sessile polyps, recommended repeat in 3 year.Pt refusedgiven sedation needed and respiratory status.   She is not interested in screening at this point... will reconsider if new GI issue starts.  PAP/DVE: Pap not indicated. Daughter with ovarian cancer. Asymptomatic today Nonsmoker HIV: refused mammo: nml10/2021   Plan DEXA at next mammogram vist  10/22  Problem List Items Addressed This Visit    Chronic hypoxemic respiratory failure (HCC) (Chronic)    Stable on continuous oxygen.      Controlled diabetes mellitus type 2 with complications (HCC) (Chronic)     Excellent improvement in control with max metfomrin ( stable normal GFR) farxiga and actos.      Hyperlipidemia associated with type 2 diabetes mellitus (HCC) (Chronic)    Stable, chronic.  Continue current medication.         Hypertension associated with diabetes ([place]) (Chronic)    Stable, chronic.  Continue current medication.         Hypothyroidism (Chronic)    Stable, chronic.  Continue current medication.         MDD (recurrent major depressive disorder) in remission (HCC) (Chronic)    Improved control of mood.      UIP (usual interstitial pneumonitis) (HCC) (Chronic)       On esbriet. Followed by pulmonary.       Other Visit Diagnoses    Medicare annual wellness visit, subsequent    -  Primary       [name], MD

## 2020-08-27 DIAGNOSIS — G4733 Obstructive sleep apnea (adult) (pediatric): Secondary | ICD-10-CM | POA: Diagnosis not present

## 2020-09-03 ENCOUNTER — Other Ambulatory Visit: Payer: Self-pay | Admitting: Family Medicine

## 2020-09-03 NOTE — Telephone Encounter (Signed)
Last office visit 08/21/2020 for Kaitlin Hamilton.  Last refilled 04/24/2020 for 30 g with no refills.  Next Appt: 02/19/21 for 6 month follow up.

## 2020-09-04 DIAGNOSIS — G4733 Obstructive sleep apnea (adult) (pediatric): Secondary | ICD-10-CM | POA: Diagnosis not present

## 2020-09-04 DIAGNOSIS — J8489 Other specified interstitial pulmonary diseases: Secondary | ICD-10-CM | POA: Diagnosis not present

## 2020-09-13 ENCOUNTER — Other Ambulatory Visit: Payer: Self-pay | Admitting: Family Medicine

## 2020-09-13 NOTE — Telephone Encounter (Signed)
Last office visit 08/21/2020 for [place].  Last refilled 09/04/2020 for 30 g with no refills. Next Appt: 02/19/21 for 6 month follow up.

## 2020-09-25 DIAGNOSIS — Z20822 Contact with and (suspected) exposure to covid-19: Secondary | ICD-10-CM | POA: Diagnosis not present

## 2020-09-28 ENCOUNTER — Ambulatory Visit (INDEPENDENT_AMBULATORY_CARE_PROVIDER_SITE_OTHER): Payer: Medicare Other | Admitting: Pulmonary Disease

## 2020-09-28 ENCOUNTER — Encounter: Payer: Self-pay | Admitting: Pulmonary Disease

## 2020-09-28 ENCOUNTER — Ambulatory Visit: Payer: Medicare Other | Admitting: Pulmonary Disease

## 2020-09-28 ENCOUNTER — Other Ambulatory Visit: Payer: Self-pay

## 2020-09-28 VITALS — BP 142/64 | HR 87 | Temp 97.6°F | Ht 64.25 in | Wt 231.4 lb

## 2020-09-28 DIAGNOSIS — J84112 Idiopathic pulmonary fibrosis: Secondary | ICD-10-CM | POA: Diagnosis not present

## 2020-09-28 DIAGNOSIS — J849 Interstitial pulmonary disease, unspecified: Secondary | ICD-10-CM

## 2020-09-28 DIAGNOSIS — Z5181 Encounter for therapeutic drug level monitoring: Secondary | ICD-10-CM | POA: Diagnosis not present

## 2020-09-28 LAB — PULMONARY FUNCTION TEST
DL/VA % pred: 100 %
DL/VA: 4.14 ml/min/mmHg/L
DLCO cor % pred: 40 %
DLCO cor: 7.94 ml/min/mmHg
DLCO unc % pred: 40 %
DLCO unc: 7.94 ml/min/mmHg
FEF 25-75 Pre: 2.24 L/s
FEF2575-%Pred-Pre: 114 %
FEV1-%Pred-Pre: 41 %
FEV1-Pre: 0.97 L
FEV1FVC-%Pred-Pre: 123 %
FEV6-%Pred-Pre: 35 %
FEV6-Pre: 1.03 L
FEV6FVC-%Pred-Pre: 104 %
FVC-%Pred-Pre: 33 %
FVC-Pre: 1.03 L
Pre FEV1/FVC ratio: 94 %
Pre FEV6/FVC Ratio: 100 %

## 2020-09-28 NOTE — Patient Instructions (Signed)
I have reviewed your lung function test which shows that the lung capacity is slightly decreased Continue the CellCept and Esbriet as prescribed  We will make referral to get the IV infusion to protect against Covid  Schedule high-resolution CT in 6 months and follow-up in clinic after CT scan.

## 2020-09-28 NOTE — Progress Notes (Signed)
Spirometry and dlco done today. 

## 2020-09-28 NOTE — Progress Notes (Signed)
Kaitlin Hamilton    [id number]    05-Oct-1950  Primary Care Physician:Bedsole, Mervyn Gay, MD  Referring Physician: [name], MD [place],  [place] [address]  Chief complaint: Follow-up for interstitial lung disease  HPI: 70 year old with history of biopsy-proven UIP pulmonary fibrosis.  Previously followed by Dr. [name] lung biopsy in December 2014 with findings showing UIP fibrosis.  There is strong suspicion for underlying connective tissue disease as she responds to prednisone. She has also been evaluated by Dr. [name] [name] at [facility]. Esbriet started in 2018 for progressive pulmonary fibrosis.  Continues on Esbriet, CellCept 1 g twice daily.  She is off prednisone at present States that breathing is doing well She has had 2 episodes of diverticulitis in 2019 and 2020, E. coli UTI in 2020 and Campylobacter colitis in December 2020. We reduced her CellCept to 500 mg twice daily due to recurrent episodes of UTI however her breathing got worse and she is back now at 1 g twice daily with stabilization of symptoms   Interim history: Continues on Esbriet and CellCept States that breathing may be slightly worse but overall she is doing okay  Outpatient Encounter Medications as of 09/28/2020  Medication Sig  . acetaminophen (TYLENOL) 500 MG tablet Take 1,000 mg by mouth every 6 (six) hours as needed for moderate pain.  . Ascorbic Acid (VITAMIN C PO) Take by mouth.  . dapagliflozin propanediol (FARXIGA) 10 MG TABS tablet Take 1 tablet (10 mg total) by mouth daily.  . ESBRIET 801 MG TABS TAKE 1 TABLET (801MG) BY  MOUTH THREE TIMES DAILY  WITH FOOD  . FIBER PO Take 1 tablet by mouth daily.  . fluticasone (FLONASE) 50 MCG/ACT nasal spray Place 1 spray into both nostrils daily.  . hydrochlorothiazide (MICROZIDE) 12.5 MG capsule TAKE 1 TABLET DAILY ALONG WITH ONE OF THE LOSARTAN 50 MG TABLETS  . levothyroxine (SYNTHROID) 125 MCG tablet TAKE 1 TABLET BY MOUTH   DAILY BEFORE BREAKFAST  . losartan (COZAAR) 50 MG tablet TAKE 1 TABLET DAILY ALONG WITH 1 OF THE HCTZ 12.5MG TABLETS  . metFORMIN (GLUCOPHAGE-XR) 500 MG 24 hr tablet Take 4 tablets (2,000 mg total) by mouth daily with breakfast.  . montelukast (SINGULAIR) 10 MG tablet Take 1 tablet (10 mg total) by mouth at bedtime.  . mycophenolate (CELLCEPT) 500 MG tablet TAKE 2 TABLETS BY MOUTH  TWICE DAILY  . NON FORMULARY Place 4 L into the nose daily. 10 Liters with exertion  . nystatin cream (MYCOSTATIN) Apply 1 application topically 3 (three) times daily.  [name] Delica Lancets 98V MISC CHECK BLOOD SUGAR ONCE DAILY.  Marland Kitchen ONETOUCH ULTRA test strip CHECK BLOOD SUGAR DAILY.  . pantoprazole (PROTONIX) 40 MG tablet TAKE 1 TABLET BY MOUTH  TWICE DAILY  . pioglitazone (ACTOS) 45 MG tablet Take 1 tablet (45 mg total) by mouth daily.  . pravastatin (PRAVACHOL) 40 MG tablet TAKE 1 TABLET DAILY.  . Probiotic Product (PROBIOTIC DAILY PO) Take by mouth.  . verapamil (CALAN-SR) 180 MG CR tablet TAKE 1 TABLET BY MOUTH  DAILY   No facility-administered encounter medications on file as of 09/28/2020.   Physical Exam: Blood pressure (!) 142/64, pulse 87, temperature 97.6 F (36.4 C), temperature source Temporal, height 5' 4.25" (1.632 m), weight 231 lb 6.4 oz (105 kg), SpO2 98 %. Gen:      No acute distress HEENT:  EOMI, sclera anicteric Neck:  No masses; no thyromegaly Lungs:    Clear to auscultation bilaterally; normal respiratory effort CV:         Regular rate and rhythm; no murmurs Abd:      + bowel sounds; soft, non-tender; no palpable masses, no distension Ext:    No edema; adequate peripheral perfusion Skin:      Warm and dry; no rash Neuro: alert and oriented x 3 Psych: normal mood and affect  Data Reviewed: Imaging: 11/2012 CT chest >>  centrilobular nodules, interlobular septal thickening worse in bases and periphery R lung > L; some GGO in bases and periphery as well, some bronchiectasis in the  bases R > L; findings suggestive of fibrosis but not UIP; question hypersensitivity pneumonitis given centrilobular nodules; also question aspiration  04/2013 Barium swallow> abnormal esophageal motility, GERD, hiatal hernia  04/2013 CT chest ([name])> findings suggestive of but not diagnostic of NSIP, small pulmonary nodule  February 2016 CT chest high resolution> slight progression in reticular abnormalities consistent with pulmonary fibrosis, uip  High-resolution CT chest 08/04/2019-basilar predominant fibrotic interstitial lung disease with mild honeycombing.  Mild progressions 2016.  UIP pattern  I have reviewed the images personally  PFTs: 04/28/2018 FVC 1.1 [35%], FEV1 1.02 [42%], F/F 92, DLCO 8.88 [36%] Severe restriction, diffusion impairment. With worsening compared to prior years.  10/12/2019 FVC 1.13 [36%], FEV1 1.05 [44%], F/F 93, DLCO 8.76 [44%] Severe restriction, diffusion impairment.  Stable compared to 2019  09/28/2020 FVC 1.03 [33%], FEV1 0.97 [41%], DLCO 7.94 [40%] Severe restriction, diffusion impairment  6 minute walk 10/19/2012 walked 500 feet in office on room air oxygenation did not drop below 90% 04/2013 6MW RA > 1100 feet, HR peak 109, O2 sat Nadir 87% 11/2013 6MW 1364 feet, O2 saturation nadir 78% 08/21/14 6MW 1400 feet 90% on 8L  Labs: 03/2013 ANA, ANCA, Anti-Jo-1, ESR, RF, SCL-70, anti-centromere, SSA/SSB, all negative; CRP 0.6;   07/17/2019-CBC within normal limits 07/26/2020-hepatic panel within normal limits  Assessment:  UIP fibrosis Presumed autoimmune component given response to immunosuppressive's in the past Currently maintained on CellCept 1 g twice daily, pirfenidone. Check CBC.  Hepatic panel in January 2022 was within normal limits Not on PJP prophylaxis due to allergies to sulfa drugs and atovaquone  We will refer her for Evusheild infusion to protect against Covid infection  PFTs reviewed with slightly worsened diffusion capacity.   Continue monitoring High-res CT in 6 months  OSA Continue CPAP Download reviewed with good compliance.  Plan/Recommendations: - Continue pirfenidone, CellCept 1 g twice daily - Check CBC - Continue supplemental oxygen - High-res CT in 6 months  [name] MD [facility] 09/28/2020, 3:57 PM  CC: [name], MD

## 2020-09-30 ENCOUNTER — Other Ambulatory Visit: Payer: Self-pay | Admitting: Family Medicine

## 2020-10-01 ENCOUNTER — Other Ambulatory Visit (INDEPENDENT_AMBULATORY_CARE_PROVIDER_SITE_OTHER): Payer: Medicare Other

## 2020-10-01 ENCOUNTER — Other Ambulatory Visit: Payer: Self-pay | Admitting: Pulmonary Disease

## 2020-10-01 ENCOUNTER — Other Ambulatory Visit: Payer: Medicare Other

## 2020-10-01 ENCOUNTER — Other Ambulatory Visit: Payer: Self-pay

## 2020-10-01 DIAGNOSIS — Z5181 Encounter for therapeutic drug level monitoring: Secondary | ICD-10-CM | POA: Diagnosis not present

## 2020-10-01 DIAGNOSIS — J849 Interstitial pulmonary disease, unspecified: Secondary | ICD-10-CM

## 2020-10-01 LAB — CBC WITH DIFFERENTIAL/PLATELET
Basophils Absolute: 0.1 10*3/uL (ref 0.0–0.1)
Basophils Relative: 0.6 % (ref 0.0–3.0)
Eosinophils Absolute: 0.3 10*3/uL (ref 0.0–0.7)
Eosinophils Relative: 3 % (ref 0.0–5.0)
HCT: 39.5 % (ref 36.0–46.0)
Hemoglobin: 12.8 g/dL (ref 12.0–15.0)
Lymphocytes Relative: 18.2 % (ref 12.0–46.0)
Lymphs Abs: 1.6 10*3/uL (ref 0.7–4.0)
MCHC: 32.5 g/dL (ref 30.0–36.0)
MCV: 93.3 fl (ref 78.0–100.0)
Monocytes Absolute: 0.5 10*3/uL (ref 0.1–1.0)
Monocytes Relative: 5.7 % (ref 3.0–12.0)
Neutro Abs: 6.2 10*3/uL (ref 1.4–7.7)
Neutrophils Relative %: 72.5 % (ref 43.0–77.0)
Platelets: 270 10*3/uL (ref 150.0–400.0)
RBC: 4.23 Mil/uL (ref 3.87–5.11)
RDW: 15 % (ref 11.5–15.5)
WBC: 8.6 10*3/uL (ref 4.0–10.5)

## 2020-10-02 DIAGNOSIS — G4733 Obstructive sleep apnea (adult) (pediatric): Secondary | ICD-10-CM | POA: Diagnosis not present

## 2020-10-02 DIAGNOSIS — J8489 Other specified interstitial pulmonary diseases: Secondary | ICD-10-CM | POA: Diagnosis not present

## 2020-10-03 ENCOUNTER — Ambulatory Visit (INDEPENDENT_AMBULATORY_CARE_PROVIDER_SITE_OTHER): Payer: Medicare Other

## 2020-10-03 ENCOUNTER — Other Ambulatory Visit: Payer: Self-pay

## 2020-10-03 DIAGNOSIS — E785 Hyperlipidemia, unspecified: Secondary | ICD-10-CM

## 2020-10-03 DIAGNOSIS — I152 Hypertension secondary to endocrine disorders: Secondary | ICD-10-CM | POA: Diagnosis not present

## 2020-10-03 DIAGNOSIS — E118 Type 2 diabetes mellitus with unspecified complications: Secondary | ICD-10-CM | POA: Diagnosis not present

## 2020-10-03 DIAGNOSIS — E1169 Type 2 diabetes mellitus with other specified complication: Secondary | ICD-10-CM | POA: Diagnosis not present

## 2020-10-03 DIAGNOSIS — E1159 Type 2 diabetes mellitus with other circulatory complications: Secondary | ICD-10-CM

## 2020-10-03 NOTE — Progress Notes (Signed)
Chronic Care Management Pharmacy Note  10/03/2020 Name:  Kaitlin Hamilton MRN:  [id number] DOB:  [birth year]  Subjective: Kaitlin Hamilton is an 70 y.o. year old female who is a primary patient of Bedsole, Amy E, MD.  The CCM team was consulted for assistance with disease management and care coordination needs.    Engaged with patient by telephone for follow up visit in response to provider referral for pharmacy case management and/or care coordination services.   Consent to Services:  The patient was given information about Chronic Care Management services, agreed to services, and gave verbal consent prior to initiation of services.  Please see initial visit note for detailed documentation.   Patient Care Team: [name], MD as PCP - General (Family Medicine) Juanito Doom, MD as Consulting Physician (Pulmonary Disease) Debbora Dus, [facility] as Pharmacist (Pharmacist)  Recent office visits: 08/21/20 - PCP visit - no medication changes  Recent consult visits: 09/28/20 - Pulmonology - Currently maintained on CellCept 1 g twice daily, pirfenidone.We will refer her for Evusheild infusion to protect against Covid infection PFTs reviewed with slightly worsened diffusion capacity.  Continue monitoring. Check CBC. Continue CPAP. Continue supplemental oxygen.   Hospital visits: None in previous 6 months  Objective:  Lab Results  Component Value Date   CREATININE 0.72 07/26/2020   BUN 14 07/26/2020   GFR 85.17 07/26/2020   GFRNONAA >60 07/17/2019   GFRAA >60 07/17/2019   NA 137 07/26/2020   K 4.3 07/26/2020   CALCIUM 10.4 07/26/2020   CO2 32 07/26/2020    Lab Results  Component Value Date/Time   HGBA1C 6.7 (H) 07/26/2020 11:18 AM   HGBA1C 7.6 (A) 04/24/2020 11:21 AM   HGBA1C 7.3 (H) 01/17/2020 09:13 AM   GFR 85.17 07/26/2020 11:18 AM   GFR 90.34 01/17/2020 09:13 AM   MICROALBUR 5.1 (H) 11/08/2013 08:58 AM    Last diabetic Eye exam:  Lab Results  Component Value Date/Time    HMDIABEYEEXA No Retinopathy 04/17/2020 12:00 AM    Last diabetic Foot exam:  Lab Results  Component Value Date/Time   HMDIABFOOTEX done 08/21/2020 12:00 AM     Lab Results  Component Value Date   CHOL 190 07/26/2020   HDL 76.70 07/26/2020   LDLCALC 77 07/26/2020   LDLDIRECT 104.0 09/04/2017   TRIG 182.0 (H) 07/26/2020   CHOLHDL 2 07/26/2020    Hepatic Function Latest Ref Rng & Units 07/26/2020 01/17/2020 07/17/2019  Total Protein 6.0 - 8.3 g/dL 7.6 7.8 8.2(H)  Albumin 3.5 - 5.2 g/dL 4.9 4.5 4.0  AST 0 - 37 U/L $Remo'11 16 17  'qCkey$ ALT 0 - 35 U/L $Remo'8 12 12  'NZwhw$ Alk Phosphatase 39 - 117 U/L 68 94 71  Total Bilirubin 0.2 - 1.2 mg/dL 0.5 0.4 0.7  Bilirubin, Direct 0.0 - 0.3 mg/dL - - -    Lab Results  Component Value Date/Time   TSH 2.03 07/26/2020 11:18 AM   TSH 1.04 07/12/2019 08:58 AM   FREET4 1.16 07/26/2020 11:18 AM   FREET4 1.21 07/12/2019 08:58 AM    CBC Latest Ref Rng & Units 10/01/2020 07/17/2019 07/12/2019  WBC 4.0 - 10.5 K/uL 8.6 10.5 10.8(H)  Hemoglobin 12.0 - 15.0 g/dL 12.8 13.9 13.7  Hematocrit 36.0 - 46.0 % 39.5 42.0 43.0  Platelets 150.0 - 400.0 K/uL 270.0 225 256.0    No results found for: VD25OH  Clinical ASCVD: No  The 10-year ASCVD risk score Mikey Bussing DC Jr., et al., 2013) is: 22.9%  Values used to calculate the score:     Age: 64 years     Sex: Female     Is Non-Hispanic African American: No     Diabetic: Yes     Tobacco smoker: No     Systolic Blood Pressure: 623 mmHg     Is BP treated: Yes     HDL Cholesterol: 76.7 mg/dL     Total Cholesterol: 190 mg/dL    Depression screen [facility] 2/9 08/21/2020 01/24/2020 07/12/2019  Decreased Interest 1 2 0  Down, Depressed, Hopeless 1 2 1   PHQ - 2 Score 2 4 1   Altered sleeping 3 3 0  Tired, decreased energy 2 3 0  Change in appetite 2 - 0  Feeling bad or failure about yourself  0 0 0  Trouble concentrating 0 0 0  Moving slowly or fidgety/restless 0 0 0  Suicidal thoughts 0 0 0  PHQ-9 Score 9 10 1   Difficult doing  work/chores Somewhat difficult Somewhat difficult Not difficult at all  Some recent data might be hidden    Social History   Tobacco Use  Smoking Status Never Smoker  Smokeless Tobacco Never Used   BP Readings from Last 3 Encounters:  09/28/20 (!) 142/64  08/21/20 110/64  04/24/20 120/60   Pulse Readings from Last 3 Encounters:  09/28/20 87  08/21/20 95  04/24/20 76   Wt Readings from Last 3 Encounters:  09/28/20 231 lb 6.4 oz (105 kg)  08/21/20 228 lb (103.4 kg)  04/24/20 233 lb 9 oz (105.9 kg)    Assessment/Interventions: Review of patient past medical history, allergies, medications, health status, including review of consultants reports, laboratory and other test data, was performed as part of comprehensive evaluation and provision of chronic care management services.   SDOH:  (Social Determinants of Health) assessments and interventions performed: Yes SDOH Interventions   Flowsheet Row Most Recent Value  SDOH Interventions   Financial Strain Interventions Intervention Not Indicated  [Medications affordable]      CCM Care Plan  Allergies  Allergen Reactions  . Adhesive [Tape]     Redness, bruising with any adhesives- bandaids, patches, etc.   . Atovaquone Itching  . Codeine Hives and Swelling  . Demerol [Meperidine] Hives and Swelling  . Hydrocodone Nausea Only  . Trazodone And Nefazodone Itching  . Sulfa Antibiotics Rash    Medications Reviewed Today    Reviewed by Debbora Dus, [facility] (Pharmacist) on 10/03/20 at 1144  Med List Status: <None>  Medication Order Taking? Sig Documenting Provider Last Dose Status Informant  acetaminophen (TYLENOL) 500 MG tablet [id number] No Take 1,000 mg by mouth every 6 (six) hours as needed for moderate pain. [provider] Taking Active Family Member  Ascorbic Acid (VITAMIN C PO) [id number] No Take by mouth. [provider] Taking Active Self  dapagliflozin propanediol (FARXIGA) 10 MG TABS tablet [id number]  No Take 1 tablet (10 mg total) by mouth daily. [name], MD Taking Active   ESBRIET 801 MG TABS [id number]  TAKE 1 TABLET (801MG ) BY  MOUTH THREE TIMES DAILY  WITH FOOD Mannam, Praveen, MD  Active   FIBER PO [id number] No Take 1 tablet by mouth daily. [provider] Taking Active Family Member  fluticasone (FLONASE) 50 MCG/ACT nasal spray [id number] No Place 1 spray into both nostrils daily. [provider] Taking Active   hydrochlorothiazide (MICROZIDE) 12.5 MG capsule [id number] No TAKE 1 TABLET DAILY ALONG WITH ONE OF THE LOSARTAN 50 MG TABLETS Bedsole, Amy  E, MD Taking Active   levothyroxine (SYNTHROID) 125 MCG tablet [id number]  TAKE 1 TABLET BY MOUTH  DAILY BEFORE BREAKFAST Bedsole, Amy E, MD  Active   losartan (COZAAR) 50 MG tablet [id number] No TAKE 1 TABLET DAILY ALONG WITH 1 OF THE HCTZ 12.$RemoveBef'5MG'DRqcPhyELB$  TABLETS Bedsole, Amy E, MD Taking Active   metFORMIN (GLUCOPHAGE-XR) 500 MG 24 hr tablet [id number] No Take 4 tablets (2,000 mg total) by mouth daily with breakfast. [name], Amy E, MD Taking Active   montelukast (SINGULAIR) 10 MG tablet [id number] No Take 1 tablet (10 mg total) by mouth at bedtime. [name], MD Taking Active   mycophenolate (CELLCEPT) 500 MG tablet [id number] No TAKE 2 TABLETS BY MOUTH  TWICE DAILY Mannam, Praveen, MD Taking Active   NON FORMULARY [id number] No Place 4 L into the nose daily. 10 Liters with exertion [provider] Taking Active Family Member  nystatin cream (MYCOSTATIN) [id number] No Apply 1 application topically 3 (three) times daily. [name], MD Taking Active   OneTouch Delica Lancets 51O MISC [id number] No CHECK BLOOD SUGAR ONCE DAILY. [name], MD Taking Active   [facility] ULTRA test strip [id number] No CHECK BLOOD SUGAR DAILY. [name], MD Taking Active   pantoprazole (PROTONIX) 40 MG tablet [id number]  TAKE 1 TABLET BY MOUTH  TWICE DAILY Bedsole, Amy E, MD  Active   pioglitazone (ACTOS) 45 MG tablet [id number] No Take 1  tablet (45 mg total) by mouth daily. [name], MD Taking Active   pravastatin (PRAVACHOL) 40 MG tablet [id number] No TAKE 1 TABLET DAILY. [name], MD Taking Active   Probiotic Product (PROBIOTIC DAILY PO) [id number] No Take by mouth. [provider] Taking Active Self  verapamil (CALAN-SR) 180 MG CR tablet [id number]  TAKE 1 TABLET BY MOUTH  DAILY Bedsole, Amy E, MD  Active           Patient Active Problem List   Diagnosis Date Noted  . Nausea vomiting and diarrhea 02/21/2019  . Controlled diabetes mellitus type 2 with complications ([place]) 03/50/0938  . MDD (recurrent major depressive disorder) in remission ([place]) 12/08/2017  . Chronic insomnia 12/08/2017  . [place] intertrigo 07/08/2016  . Right sciatic nerve pain 03/09/2015  . Chronic pain of left thumb 03/09/2015  . Allergic rhinitis 10/06/2014  . Chronic hypoxemic respiratory failure ([place]) 01/31/2014  . Cystocele 11/11/2013  . Hypertension associated with diabetes ([place]) 08/25/2013  . Hyperlipidemia associated with type 2 diabetes mellitus ([place]) 08/25/2013  . Common migraine 08/25/2013  . Hypothyroidism 08/25/2013  . OSA (obstructive sleep apnea) 08/22/2013  . GERD (gastroesophageal reflux disease) 06/01/2013  . Esophageal dysmotility 05/17/2013  . UIP (usual interstitial pneumonitis) ([place]) 10/19/2012    Immunization History  Administered Date(s) Administered  . Fluad Quad(high Dose 65+) 04/03/2020  . Influenza Split 05/02/2005, 11/28/2014  . Influenza Whole 04/20/2012  . Influenza, High Dose Seasonal PF 04/16/2018  . Influenza,inj,Quad PF,6+ Mos 05/17/2013, 04/04/2014, 10/06/2014, 05/11/2015, 10/19/2015, 03/12/2016, 04/30/2017, 10/14/2017, 03/16/2019  . Influenza-Unspecified 04/20/2013  . Moderna Sars-Covid-2 Vaccination 08/20/2019, 09/19/2019, 05/17/2020  . Pneumococcal Conjugate-13 04/04/2014  . Pneumococcal Polysaccharide-23 04/20/2013, 04/16/2018  . Tdap 07/21/2009    Conditions to be  addressed/monitored:  Hypertension, Hyperlipidemia and Diabetes  Care Plan : [place]  Updates made by Debbora Dus, [place] since 10/03/2020 12:00 AM    Problem: CHL AMB "PATIENT-SPECIFIC PROBLEM"     Long-Range Goal: Disease Management   Start Date: 10/03/2020  Priority: High  Note:  Current Barriers:  . None identified  Pharmacist Clinical Goal(s):  Marland Kitchen Patient will contact provider office for questions/concerns as evidenced notation of same in electronic health record through collaboration with PharmD and provider.   Interventions: . 1:1 collaboration with [name], MD regarding development and update of comprehensive plan of care as evidenced by provider attestation and co-signature . Inter-disciplinary care team collaboration (see longitudinal plan of care) . Comprehensive medication review performed; medication list updated in electronic medical record  Hyperlipidemia (LDL goal < 100) Controlled - LDL 77 -Current treatment:  Pravastatin 40 mg - 1 tablet every evening -Medications previously tried: Atorvastatin (muscle pain) -Recommended to continue current medication  Hypertension (BP goal <140/90) Query -Controlled - last clinic reading a bit elevated, usually within goal in clinic -Current treatment: . HCTZ 12.5 mg - 1 tablet daily . Verapamil ER 180 mg - 1 tablet daily . Losartan 50 mg - 1 tablet daily  -Medications previously tried: none  -Current home readings: none reported, checks when symptomatic -Denies hypotensive/hypertensive symptoms -Monitor BP at home monthly, document, and provide log at future appointments -Recommended to continue current medication; CMA call for BP check in 1 month.   Diabetes (A1c goal <7%) -Controlled - A1c 6.7% -Current medications: . Farxiga 10 mg - 1 tablet daily . Metformin $RemoveBef'500mg'VoXBIpGGDv$  ER - 4 tablets once daily with breakfast . Pioglitazone 45 mg - 1 tablet daily  -Medications previously tried: none  -Current  home glucose readings - checking daily . fasting glucose: 100-140s . post prandial glucose: none  -Denies hypoglycemic/hyperglycemic symptoms -Current meal patterns: 1 meal a day. Usually limits meats and eats some sort of vegetable such as corn, sweet potatoes or butter beans.  -Current exercise: minimal due to lung condition -Up to date on annual eye and foot exam -Recommended to continue current medication  Patient Goals/Self-Care Activities . Patient will:  - check blood pressure once monthly, document, and provide at future appointments  - check blood glucose daily  Follow Up Plan: The care management team will reach out to the patient again over the next 30 days.      Medication Assistance: None required.  Patient affirms current coverage meets needs.  Patient's preferred pharmacy is:  [place], [place] [address] Phone: [phone] Fax: [phone]  [place], [address], [address] [place], [place] [address] Phone: [phone] Fax: [place], [place] [address] Phone: [phone] Fax: [place], [place] - [place] [address] Phone: [phone] Fax: [phone]  [facility] - [place], [place] [address] Phone: [phone] Fax: [phone]  [facility] ([place], [address] [place] [address] Phone: [phone] Fax: [phone]  Uses pill box? Yes Pt endorses 100% compliance Using [facility] and [address] No gaps in adherence   We discussed: Current pharmacy is preferred with insurance plan and patient is satisfied with pharmacy services Patient  decided to: Continue current medication management strategy  Care Plan and Follow Up Patient Decision:  Patient agrees to Care Plan and Follow-up.  Debbora Dus, PharmD Clinical Pharmacist [facility] [place]

## 2020-10-03 NOTE — Patient Instructions (Signed)
Dear Kaitlin Hamilton,  Below is a summary of the goals we discussed during our follow up appointment on October 03, 2020. Please contact me anytime with questions or concerns.   Visit Information  Patient Care Plan: CCM Pharmacy Care Plan    Problem Identified: CHL AMB "PATIENT-SPECIFIC PROBLEM"     Long-Range Goal: Disease Management   Start Date: 10/03/2020  Priority: High  Note:    Current Barriers:  . None identified  Pharmacist Clinical Goal(s):  Marland Kitchen Patient will contact provider office for questions/concerns as evidenced notation of same in electronic health record through collaboration with PharmD and provider.   Interventions: . 1:1 collaboration with [name], MD regarding development and update of comprehensive plan of care as evidenced by provider attestation and co-signature . Inter-disciplinary care team collaboration (see longitudinal plan of care) . Comprehensive medication review performed; medication list updated in electronic medical record  Hyperlipidemia (LDL goal < 100) Controlled - LDL 77 -Current treatment:  Pravastatin 40 mg - 1 tablet every evening -Medications previously tried: Atorvastatin (muscle pain) -Recommended to continue current medication  Hypertension (BP goal <140/90) Query -Controlled - last clinic reading a bit elevated, usually within goal in clinic -Current treatment: . HCTZ 12.5 mg - 1 tablet daily . Verapamil ER 180 mg - 1 tablet daily . Losartan 50 mg - 1 tablet daily  -Medications previously tried: none  -Current home readings: none reported, checks when symptomatic -Denies hypotensive/hypertensive symptoms -Monitor BP at home monthly, document, and provide log at future appointments -Recommended to continue current medication; CMA call for BP check in 1 month.   Diabetes (A1c goal <7%) -Controlled - A1c 6.7% -Current medications: . Farxiga 10 mg - 1 tablet daily . Metformin 500mg  ER - 4 tablets once daily with  breakfast . Pioglitazone 45 mg - 1 tablet daily  -Medications previously tried: none  -Current home glucose readings - checking daily . fasting glucose: 100-140s . post prandial glucose: none  -Denies hypoglycemic/hyperglycemic symptoms -Current meal patterns: 1 meal a day. Usually limits meats and eats some sort of vegetable such as corn, sweet potatoes or butter beans.  -Current exercise: minimal due to lung condition -Up to date on annual eye and foot exam -Recommended to continue current medication  Patient Goals/Self-Care Activities . Patient will:  - check blood pressure once monthly, document, and provide at future appointments  - check blood glucose daily  Follow Up Plan: The care management team will reach out to the patient again over the next 30 days.       Patient verbalizes understanding of instructions provided today and agrees to view in [place].   Debbora Dus, PharmD Clinical Pharmacist [facility] at [facility] [phone]   How to Take Your Blood Pressure Blood pressure measures how strongly your blood is pressing against the walls of your arteries. Arteries are blood vessels that carry blood from your heart throughout your body. You can take your blood pressure at home with a machine. You may need to check your blood pressure at home:  To check if you have high blood pressure (hypertension).  To check your blood pressure over time.  To make sure your blood pressure medicine is working. Supplies needed:  Blood pressure machine, or monitor.  Dining room chair to sit in.  Table or desk.  Small notebook.  Pencil or pen. How to prepare Avoid these things for 30 minutes before checking your blood pressure:  Having drinks with caffeine in them, such  as coffee or tea.  Drinking alcohol.  Eating.  Smoking.  Exercising. Do these things five minutes before checking your blood pressure:  Go to the bathroom and pee (urinate).  Sit  in a dining chair. Do not sit in a soft couch or an armchair.  Be quiet. Do not talk. How to take your blood pressure Follow the instructions that came with your machine. If you have a digital blood pressure monitor, these may be the instructions: 1. Sit up straight. 2. Place your feet on the floor. Do not cross your ankles or legs. 3. Rest your left arm at the level of your heart. You may rest it on a table, desk, or chair. 4. Pull up your shirt sleeve. 5. Wrap the blood pressure cuff around the upper part of your left arm. The cuff should be 1 inch (2.5 cm) above your elbow. It is best to wrap the cuff around bare skin. 6. Fit the cuff snugly around your arm. You should be able to place only one finger between the cuff and your arm. 7. Place the cord so that it rests in the bend of your elbow. 8. Press the power button. 9. Sit quietly while the cuff fills with air and loses air. 10. Write down the numbers on the screen. 11. Wait 2-3 minutes and then repeat steps 1-10.   What do the numbers mean? Two numbers make up your blood pressure. The first number is called systolic pressure. The second is called diastolic pressure. An example of a blood pressure reading is "120 over 80" (or 120/80). If you are an adult and do not have a medical condition, use this guide to find out if your blood pressure is normal: Normal  First number: below 120.  Second number: below 80. Elevated  First number: 120-129.  Second number: below 80. Hypertension stage 1  First number: 130-139.  Second number: 80-89. Hypertension stage 2  First number: 140 or above.  Second number: 2 or above. Your blood pressure is above normal even if only the top or bottom number is above normal. Follow these instructions at home:  Check your blood pressure as often as your doctor tells you to.  Check your blood pressure at the same time every day.  Take your monitor to your next doctor's appointment. Your  doctor will: ? Make sure you are using it correctly. ? Make sure it is working right.  Make sure you understand what your blood pressure numbers should be.  Tell your doctor if your medicine is causing side effects.  Keep all follow-up visits as told by your doctor. This is important. General tips:  You will need a blood pressure machine, or monitor. Your doctor can suggest a monitor. You can buy one at a drugstore or online. When choosing one: ? Choose one with an arm cuff. ? Choose one that wraps around your upper arm. Only one finger should fit between your arm and the cuff. ? Do not choose one that measures your blood pressure from your wrist or finger. Where to find more information American Heart Association: www.heart.org Contact a doctor if:  Your blood pressure keeps being high. Get help right away if:  Your first blood pressure number is higher than 180.  Your second blood pressure number is higher than 120. Summary  Check your blood pressure at the same time every day.  Avoid caffeine, alcohol, smoking, and exercise for 30 minutes before checking your blood pressure.  Make sure  you understand what your blood pressure numbers should be. This information is not intended to replace advice given to you by your health care provider. Make sure you discuss any questions you have with your health care provider. Document Revised: 07/01/2019 Document Reviewed: 07/01/2019 Elsevier Patient Education  2021 [employer].

## 2020-10-03 NOTE — Progress Notes (Signed)
Encounter details: CCM Time Spent       Value Time User   Time spent with patient (minutes)  10 10/03/2020 12:00 PM Debbora Dus, [facility]   Time spent performing Chart review  30 10/03/2020 12:00 PM Debbora Dus, [facility]   Total time (minutes)  40 10/03/2020 12:00 PM Debbora Dus, RPH      Moderate to High Complex Decision Making       Value Time User   Moderate to High complex decision making  Yes 10/03/2020 12:00 PM Debbora Dus, [facility]      CCM Services: This encounter meets complex CCM services and moderate to high decision making.  Prior to outreach and patient consent for Chronic Care Management, I referred this patient for services after reviewing the nominated patient list or from a personal encounter with the patient.  I have personally reviewed this encounter including the documentation in this note and have collaborated with the care management provider regarding care management and care coordination activities to include development and update of the comprehensive care plan. I am certifying that I agree with the content of this note and encounter as supervising physician.

## 2020-10-04 ENCOUNTER — Other Ambulatory Visit: Payer: Self-pay | Admitting: Adult Health

## 2020-10-04 ENCOUNTER — Telehealth: Payer: Self-pay | Admitting: Adult Health

## 2020-10-04 NOTE — Progress Notes (Signed)
I connected by phone with Kaitlin Hamilton on 10/04/2020, 12:56 PM to discuss the potential use of a new treatment, tixagevimab/cilgavimab, for pre-exposure prophylaxis for prevention of coronavirus disease 2019 (COVID-19) caused by the SARS-CoV-2 virus.  This patient is a 70 y.o. female that meets the FDA criteria for Emergency Use Authorization of tixagevimab/cilgavimab for pre-exposure prophylaxis of COVID-19 disease. Pt meets following criteria:  Age >12 yr and weight > 40kg  Not currently infected with SARS-CoV-2 and has no known recent exposure to an individual infected with SARS-CoV-2 AND o Who has moderate to severe immune compromise due to a medical condition or receipt of immunosuppressive medications or treatments and may not mount an adequate immune response to COVID-19 vaccination or  o Vaccination with any available COVID-19 vaccine, according to the approved or authorized schedule, is not recommended due to a history of severe adverse reaction (e.g., severe allergic reaction) to a COVID-19 vaccine(s) and/or COVID-19 vaccine component(s).  o Patient meets the following definition of mod-severe immune compromised status: 8. Other groups not previously mentioned: 4. immunosuppressive therapy  I have spoken and communicated the following to the patient or parent/caregiver regarding COVID monoclonal antibody treatment:  1. FDA has authorized the emergency use of tixagevimab/cilgavimab for the pre-exposure prophylaxis of COVID-19 in patients with moderate-severe immunocompromised status, who meet above EUA criteria.  2. The significant known and potential risks and benefits of COVID monoclonal antibody, and the extent to which such potential risks and benefits are unknown.  3. Information on available alternative treatments and the risks and benefits of those alternatives, including clinical trials.  4. The patient or parent/caregiver has the option to accept or refuse COVID monoclonal  antibody treatment.  After reviewing this information with the patient, agree to receive tixagevimab/cilgavimab.  Patient asked for me to call her daughter to schedule.    Scot Dock, NP, 10/04/2020, 12:56 PM

## 2020-10-04 NOTE — Telephone Encounter (Signed)
Called patient daughter Donella Stade and gave information on Evusheld injection appt on 10/12/2020 at [place], NP

## 2020-10-12 ENCOUNTER — Encounter (HOSPITAL_COMMUNITY): Payer: Medicare Other

## 2020-10-16 ENCOUNTER — Encounter: Payer: Self-pay | Admitting: *Deleted

## 2020-10-25 ENCOUNTER — Other Ambulatory Visit: Payer: Self-pay | Admitting: Adult Health

## 2020-10-25 DIAGNOSIS — D849 Immunodeficiency, unspecified: Secondary | ICD-10-CM

## 2020-10-26 ENCOUNTER — Other Ambulatory Visit: Payer: Self-pay

## 2020-10-26 ENCOUNTER — Encounter (HOSPITAL_COMMUNITY)
Admission: RE | Admit: 2020-10-26 | Discharge: 2020-10-26 | Disposition: A | Discharge status: Home / Self Care | Payer: Medicare Other | Source: Ambulatory Visit | Attending: Adult Health | Admitting: Adult Health

## 2020-10-26 DIAGNOSIS — D849 Immunodeficiency, unspecified: Secondary | ICD-10-CM | POA: Insufficient documentation

## 2020-10-26 MED ORDER — TIXAGEVIMAB (PART OF EVUSHELD) INJECTION
300.0000 mg | Freq: Once | INTRAMUSCULAR | Status: AC
  Administered 2020-10-26: 300 mg via INTRAMUSCULAR
  Filled 2020-10-26: qty 3

## 2020-10-26 MED ORDER — ALBUTEROL SULFATE HFA 108 (90 BASE) MCG/ACT IN AERS
2.0000 | INHALATION_SPRAY | Freq: Once | RESPIRATORY_TRACT | Status: AC | PRN
  Filled 2020-10-26: qty 6.7

## 2020-10-26 MED ORDER — CILGAVIMAB (PART OF EVUSHELD) INJECTION
300.0000 mg | Freq: Once | INTRAMUSCULAR | Status: AC
  Administered 2020-10-26: 300 mg via INTRAMUSCULAR
  Filled 2020-10-26: qty 3

## 2020-10-26 MED ORDER — DIPHENHYDRAMINE HCL 50 MG/ML IJ SOLN: 50.0000 mg | Freq: Once | INTRAMUSCULAR | Status: DC | PRN

## 2020-10-26 MED ORDER — METHYLPREDNISOLONE SODIUM SUCC 125 MG IJ SOLR: 125.0000 mg | Freq: Once | INTRAMUSCULAR | Status: AC | PRN

## 2020-10-26 MED ORDER — EPINEPHRINE 0.3 MG/0.3ML IJ SOAJ: 0.3000 mg | Freq: Once | INTRAMUSCULAR | Status: AC | PRN

## 2020-11-02 DIAGNOSIS — G4733 Obstructive sleep apnea (adult) (pediatric): Secondary | ICD-10-CM | POA: Diagnosis not present

## 2020-11-02 DIAGNOSIS — J8489 Other specified interstitial pulmonary diseases: Secondary | ICD-10-CM | POA: Diagnosis not present

## 2020-11-05 ENCOUNTER — Telehealth: Payer: Self-pay

## 2020-11-05 NOTE — Chronic Care Management (AMB) (Addendum)
Chronic Care Management Pharmacy Assistant   Name: DAILEE MANALANG  MRN: [id number] DOB: 03-Dec-1950   Reason for Encounter: Disease State HTN   Conditions to be addressed/monitored: HTN  Recent office visits:  None since last CCM contact  Recent consult visits:  10/26/2020 - Thedore Mins, [facility] visits:  None in previous 6 months  Medications: Outpatient Encounter Medications as of 11/05/2020  Medication Sig   acetaminophen (TYLENOL) 500 MG tablet Take 1,000 mg by mouth every 6 (six) hours as needed for moderate pain.   Ascorbic Acid (VITAMIN C PO) Take by mouth.   dapagliflozin propanediol (FARXIGA) 10 MG TABS tablet Take 1 tablet (10 mg total) by mouth daily.   ESBRIET 801 MG TABS TAKE 1 TABLET (801MG ) BY  MOUTH THREE TIMES DAILY  WITH FOOD   FIBER PO Take 1 tablet by mouth daily.   fluticasone (FLONASE) 50 MCG/ACT nasal spray Place 1 spray into both nostrils daily.   hydrochlorothiazide (MICROZIDE) 12.5 MG capsule TAKE 1 TABLET DAILY ALONG WITH ONE OF THE LOSARTAN 50 MG TABLETS   levothyroxine (SYNTHROID) 125 MCG tablet TAKE 1 TABLET BY MOUTH  DAILY BEFORE BREAKFAST   losartan (COZAAR) 50 MG tablet TAKE 1 TABLET DAILY ALONG WITH 1 OF THE HCTZ 12.5MG  TABLETS   metFORMIN (GLUCOPHAGE-XR) 500 MG 24 hr tablet Take 4 tablets (2,000 mg total) by mouth daily with breakfast.   montelukast (SINGULAIR) 10 MG tablet Take 1 tablet (10 mg total) by mouth at bedtime.   mycophenolate (CELLCEPT) 500 MG tablet TAKE 2 TABLETS BY MOUTH  TWICE DAILY   NON FORMULARY Place 4 L into the nose daily. 10 Liters with exertion   nystatin cream (MYCOSTATIN) Apply 1 application topically 3 (three) times daily.   OneTouch Delica Lancets 10G MISC CHECK BLOOD SUGAR ONCE DAILY.   ONETOUCH ULTRA test strip CHECK BLOOD SUGAR DAILY.   pantoprazole (PROTONIX) 40 MG tablet TAKE 1 TABLET BY MOUTH  TWICE DAILY   pioglitazone (ACTOS) 45 MG tablet Take 1 tablet (45 mg total) by mouth daily.    pravastatin (PRAVACHOL) 40 MG tablet TAKE 1 TABLET DAILY.   Probiotic Product (PROBIOTIC DAILY PO) Take by mouth.   verapamil (CALAN-SR) 180 MG CR tablet TAKE 1 TABLET BY MOUTH  DAILY   No facility-administered encounter medications on file as of 11/05/2020.   Recent Office Vitals: BP Readings from Last 3 Encounters:  10/26/20 131/65  09/28/20 (!) 142/64  08/21/20 110/64   Pulse Readings from Last 3 Encounters:  10/26/20 80  09/28/20 87  08/21/20 95    Wt Readings from Last 3 Encounters:  09/28/20 231 lb 6.4 oz (105 kg)  08/21/20 228 lb (103.4 kg)  04/24/20 233 lb 9 oz (105.9 kg)    Kidney Function Lab Results  Component Value Date/Time   CREATININE 0.72 07/26/2020 11:18 AM   CREATININE 0.65 01/17/2020 09:13 AM   CREATININE 1.07 07/26/2014 12:29 PM   GFR 85.17 07/26/2020 11:18 AM   GFRNONAA >60 07/17/2019 02:05 PM   GFRNONAA 55 (L) 07/26/2014 12:29 PM   GFRAA >60 07/17/2019 02:05 PM   GFRAA >60 07/26/2014 12:29 PM    BMP Latest Ref Rng & Units 07/26/2020 01/17/2020 07/17/2019  Glucose 70 - 99 mg/dL 129(H) 171(H) 143(H)  BUN 6 - 23 mg/dL 14 15 19   Creatinine 0.40 - 1.20 mg/dL 0.72 0.65 0.78  Sodium 135 - 145 mEq/L 137 135 136  Potassium 3.5 - 5.1 mEq/L 4.3 4.2 2.8(L)  Chloride  96 - 112 mEq/L 95(L) 94(L) 93(L)  CO2 19 - 32 mEq/L 32 31 26  Calcium 8.4 - 10.5 mg/dL 10.4 10.4 9.6    Current antihypertensive regimen:   HCTZ 12.5 mg - 1 tablet daily  Verapamil ER 180 mg - 1 tablet daily  Losartan 50 mg - 1 tablet daily   Patient verbally confirms she is taking the above medications as directed. Yes  How often are you checking your Blood Pressure? daily  She checks her blood pressure in the morning on occasion. She purchased a new BP cuff and monitor last night.   Current home BP readings:   DATE:             BP               PULSE  10/26/2020 131/65  80  11/07/2020 129/72  87  Wrist or arm cuff: arm cuff OTC medications including pseudoephedrine or NSAIDs? no  Any  readings above 180/120? No If yes any symptoms of hypertensive emergency? patient denies any symptoms of high blood pressure  What recent interventions/DTPs have been made by any provider to improve Blood Pressure control since last CPP Visit:        None identified  Any recent hospitalizations or ED visits since last visit with CPP? No  What diet changes have been made to improve Blood Pressure Control?    The patient reports she will eat out at restaurant on occasion, however she is cooking healthy meals at home.   What exercise is being done to improve your Blood Pressure Control?   The patient reports that she is getting out more and moving around in her yard for exercise.     Adherence Review: Is the patient currently on ACE/ARB medication? Yes Does the patient have >5 day gap between last estimated fill dates? No gaps in adherence   Star Rating Drugs:  Medication:  Last Fill: Day Supply Losartan 50mg  10/24/2020 30ds  Metformin XR-500mg .09/21/2020 90ds Pioglitazone45mg . 09/10/2020 90ds Pravastatin 40mg  08/20/2020 90ds Farxiga 10mg .  10/18/2020 90ds  Follow-Up:  Pharmacist Review  Debbora Dus, CPP notified  Avel Sensor, [place] Assistant [phone]  I have reviewed the care management and care coordination activities outlined in this encounter and I am certifying that I agree with the content of this note. No further action required.  Debbora Dus, PharmD Clinical Pharmacist [facility] at [facility] [address] [phone]

## 2020-12-02 DIAGNOSIS — G4733 Obstructive sleep apnea (adult) (pediatric): Secondary | ICD-10-CM | POA: Diagnosis not present

## 2020-12-02 DIAGNOSIS — J8489 Other specified interstitial pulmonary diseases: Secondary | ICD-10-CM | POA: Diagnosis not present

## 2020-12-04 ENCOUNTER — Other Ambulatory Visit: Payer: Self-pay | Admitting: Pulmonary Disease

## 2020-12-25 ENCOUNTER — Telehealth: Payer: Self-pay

## 2020-12-25 NOTE — Chronic Care Management (AMB) (Addendum)
Chronic Care Management Pharmacy Assistant   Name: Kaitlin Hamilton  MRN: [id number] DOB: Apr 14, 1951   Reason for Encounter: Disease State - DM   Recent office visits:  None since last CCM contact  Recent consult visits:  None since last CCM contact  Hospital visits:  None in previous 6 months  Medications: Outpatient Encounter Medications as of 12/25/2020  Medication Sig   acetaminophen (TYLENOL) 500 MG tablet Take 1,000 mg by mouth every 6 (six) hours as needed for moderate pain.   Ascorbic Acid (VITAMIN C PO) Take by mouth.   dapagliflozin propanediol (FARXIGA) 10 MG TABS tablet Take 1 tablet (10 mg total) by mouth daily.   ESBRIET 801 MG TABS TAKE 1 TABLET (801MG ) BY  MOUTH THREE TIMES DAILY  WITH FOOD   FIBER PO Take 1 tablet by mouth daily.   fluticasone (FLONASE) 50 MCG/ACT nasal spray Place 1 spray into both nostrils daily.   hydrochlorothiazide (MICROZIDE) 12.5 MG capsule TAKE 1 TABLET DAILY ALONG WITH ONE OF THE LOSARTAN 50 MG TABLETS   levothyroxine (SYNTHROID) 125 MCG tablet TAKE 1 TABLET BY MOUTH  DAILY BEFORE BREAKFAST   losartan (COZAAR) 50 MG tablet TAKE 1 TABLET DAILY ALONG WITH 1 OF THE HCTZ 12.5MG  TABLETS   metFORMIN (GLUCOPHAGE-XR) 500 MG 24 hr tablet Take 4 tablets (2,000 mg total) by mouth daily with breakfast.   montelukast (SINGULAIR) 10 MG tablet Take 1 tablet (10 mg total) by mouth at bedtime.   mycophenolate (CELLCEPT) 500 MG tablet TAKE 2 TABLETS BY MOUTH  TWICE DAILY   NON FORMULARY Place 4 L into the nose daily. 10 Liters with exertion   nystatin cream (MYCOSTATIN) Apply 1 application topically 3 (three) times daily.   OneTouch Delica Lancets 71I MISC CHECK BLOOD SUGAR ONCE DAILY.   ONETOUCH ULTRA test strip CHECK BLOOD SUGAR DAILY.   pantoprazole (PROTONIX) 40 MG tablet TAKE 1 TABLET BY MOUTH  TWICE DAILY   pioglitazone (ACTOS) 45 MG tablet Take 1 tablet (45 mg total) by mouth daily.   pravastatin (PRAVACHOL) 40 MG tablet TAKE 1 TABLET DAILY.    Probiotic Product (PROBIOTIC DAILY PO) Take by mouth.   verapamil (CALAN-SR) 180 MG CR tablet TAKE 1 TABLET BY MOUTH  DAILY   No facility-administered encounter medications on file as of 12/25/2020.   Recent Relevant Labs: Lab Results  Component Value Date/Time   HGBA1C 6.7 (H) 07/26/2020 11:18 AM   HGBA1C 7.6 (A) 04/24/2020 11:21 AM   HGBA1C 7.3 (H) 01/17/2020 09:13 AM   MICROALBUR 5.1 (H) 11/08/2013 08:58 AM    Kidney Function Lab Results  Component Value Date/Time   CREATININE 0.72 07/26/2020 11:18 AM   CREATININE 0.65 01/17/2020 09:13 AM   CREATININE 1.07 07/26/2014 12:29 PM   GFR 85.17 07/26/2020 11:18 AM   GFRNONAA >60 07/17/2019 02:05 PM   GFRNONAA 55 (L) 07/26/2014 12:29 PM   GFRAA >60 07/17/2019 02:05 PM   GFRAA >60 07/26/2014 12:29 PM   Current antihyperglycemic regimen:  Farxiga 10 mg - 1 tablet daily Metformin 500mg  ER - 4 tablets once daily with breakfast Pioglitazone 45 mg - 1 tablet daily     Patient verbally confirms she is taking the above medications as directed. Yes confirmed all 3 medications and how she takes them.  What recent interventions/DTPs have been made to improve glycemic control: none identified  Have there been any recent hospitalizations or ED visits since last visit with CPP? No  Patient denies hypoglycemic symptoms, including Pale, Sweaty,  Shaky, Hungry, Nervous/irritable and Vision changes  Patient denies hyperglycemic symptoms, including blurry vision, excessive thirst, fatigue, polyuria and weakness  How often are you checking your blood sugar? in the morning before eating or drinking   What are your blood sugars ranging?  Fasting:  12/17/20-118  12/18/20-107  12/19/20-101  12/20/20-111  12/21/20-140  12/22/20-156  12/23/20-170  12/24/20-145    After meals: n/a  Bedtime: n/a  On insulin? No  During the week, how often does your blood glucose drop below 70? Never  Are you checking your feet daily/regularly? Yes  Adherence Review: Is  the patient currently on a STATIN medication? Yes Is the patient currently on ACE/ARB medication? Yes Does the patient have >5 day gap between last estimated fill dates? No gaps   Star Rating Drugs:  Medication:  Last Fill: Day Supply Farxiga 10mg   10/18/2020 90 Metformin 500mg  12/18/20 90 Actos 45mg   12/06/20 90 Pravastatin 40mg  11/15/20 90 Losartan 50mg  11/20/20  30  Follow-Up:  Pharmacist Review  Debbora Dus, CPP notified  [place] Clinical Pharmacy Assistant [phone]  I have reviewed the care management and care coordination activities outlined in this encounter and I am certifying that I agree with the content of this note. No further action required.  Debbora Dus, PharmD Clinical Pharmacist [facility] at [facility] [phone]

## 2021-01-02 DIAGNOSIS — J8489 Other specified interstitial pulmonary diseases: Secondary | ICD-10-CM | POA: Diagnosis not present

## 2021-01-02 DIAGNOSIS — G4733 Obstructive sleep apnea (adult) (pediatric): Secondary | ICD-10-CM | POA: Diagnosis not present

## 2021-01-04 ENCOUNTER — Ambulatory Visit (INDEPENDENT_AMBULATORY_CARE_PROVIDER_SITE_OTHER)
Admission: RE | Admit: 2021-01-04 | Discharge: 2021-01-04 | Disposition: A | Discharge status: Home / Self Care | Payer: Medicare Other | Source: Ambulatory Visit | Attending: Primary Care | Admitting: Primary Care

## 2021-01-04 ENCOUNTER — Ambulatory Visit (INDEPENDENT_AMBULATORY_CARE_PROVIDER_SITE_OTHER): Payer: Medicare Other | Admitting: Primary Care

## 2021-01-04 ENCOUNTER — Other Ambulatory Visit: Payer: Self-pay

## 2021-01-04 VITALS — BP 130/70 | HR 80 | Temp 98.0°F | Wt 230.5 lb

## 2021-01-04 DIAGNOSIS — R300 Dysuria: Secondary | ICD-10-CM

## 2021-01-04 DIAGNOSIS — R109 Unspecified abdominal pain: Secondary | ICD-10-CM

## 2021-01-04 DIAGNOSIS — R10A0 Flank pain, unspecified side: Secondary | ICD-10-CM

## 2021-01-04 LAB — POC URINALSYSI DIPSTICK (AUTOMATED)
Bilirubin, UA: NEGATIVE
Blood, UA: POSITIVE
Glucose, UA: POSITIVE — AB
Ketones, UA: NEGATIVE
Leukocytes, UA: NEGATIVE
Nitrite, UA: NEGATIVE
Protein, UA: POSITIVE — AB
Spec Grav, UA: 1.02 (ref 1.010–1.025)
Urobilinogen, UA: 0.2 E.U./dL
pH, UA: 5.5 (ref 5.0–8.0)

## 2021-01-04 MED ORDER — TAMSULOSIN HCL 0.4 MG PO CAPS: 0.4000 mg | ORAL_CAPSULE | Freq: Every day | ORAL | 0 refills | Status: DC

## 2021-01-04 NOTE — Patient Instructions (Signed)
Complete xray(s) prior to leaving today. I will notify you of your results once received.  Start tamsulosin (Flomax) 0.4 mg once daily for urine flow and to facilitate kidney stone removal.  Please call us if no improvement by early next week. Go to the hospital if you develop fevers with increased pain, a lot of blood in the urine.   It was a pleasure meeting you!

## 2021-01-04 NOTE — Assessment & Plan Note (Signed)
Acute for the last 4-10 days, unclear onset, but worse over last four days.   Exam today with left CVA tenderness without obvious signs of systemic infection.  Suspect renal stone given HPI, history, and presentation. She does not appear to have acute pyelonephritis.   UA today negative for leuks and nitrites, positive glucose, positive blood.   Culture sent.  Rx for tamsulosin sent to pharmacy, she has taken this before and has had no issues. KUB xray pending.  ED precautions provided.

## 2021-01-04 NOTE — Progress Notes (Signed)
Subjective:    Patient ID: Kaitlin Hamilton, female    DOB: Jan 06, 1951, 70 y.o.   MRN: [id number]  HPI  Kaitlin Hamilton is a very pleasant 70 y.o. female patient of Dr. [name] with a history of hypertension, type 2 diabetes (managed on Farxiga), chronic hypoxic respiratory failure, renal stones, hypothyroidism who presents today with a chief complaint of dysuria.   Symptoms began to her left lower back one week ago when she had to catch herself from falling. Four days ago she began to notice radiation of pain around to the left groin. She continues to notice left lower back and now flank pain. Also with dysuria, difficulty urinating. She's been taking Tylenol with some improvement.   She denies hematuria, vaginal symptoms, nausea, fevers.   Review of Systems  Constitutional:  Negative for fever.  Gastrointestinal:  Negative for abdominal pain.  Genitourinary:  Positive for difficulty urinating, dysuria and flank pain. Negative for hematuria and vaginal discharge.        Past Medical History:  Diagnosis Date   Allergic rhinitis    uses Flonase daily   Anesthesia complication    Per pt/ past endoscopy in 2015, the anesthetic spray in back throat caused her to have breathing problems   Anxiety    Bronchitis    Chicken pox    Constipation    takes Fiber daily   Depression    Diverticulosis    Dysphagia    GERD (gastroesophageal reflux disease)    takes Protonix daily   H/O hiatal hernia    Hemorrhoids    History of bronchitis    2013   History of colon polyps    History of kidney stones    Hyperlipidemia    lost 43 pounds and no meds required at present   Hypertension    takes Verapamil and HCTZ daily   Hypokalemia    Hypothyroidism (acquired)    takes Synthroid daily   Insomnia    doesn't take any meds for this   Joint swelling    left thumb   Migraines    last one about a month ago   Non-insulin dependent type 2 diabetes mellitus ([place])    takes Glipizide daily   OA  (osteoarthritis)    left knee   Oxygen deficiency    Oxygen dependent    Pt is on continuous O2 at 3 liters!   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   Smoking status: Never   Smokeless tobacco: Never  Vaping Use   Vaping Use: Never used  Substance and Sexual Activity   Alcohol use: No    Alcohol/week: 0.0 standard drinks   Drug use: No   Sexual activity: Never    Birth control/protection: Post-menopausal  Other Topics Concern   Not on file  Social History Narrative   Daily caffeine     Exercise: none recently    Diet: poor   Social Determinants of Radio broadcast assistant  Strain: Low Risk    Difficulty of Paying Living Expenses: Not very hard  Food Insecurity: Not on file  Transportation Needs: Not on file  Physical Activity: Not on file  Stress: Not on file  Social Connections: Not on file  Intimate Partner Violence: Not on file    Past Surgical History:  Procedure Laterality Date   ABDOMINAL EXPLORATION SURGERY     For Ovarian Cyst    APPENDECTOMY     CHOLECYSTECTOMY     COLONOSCOPY  05/2013   TA, severe diverticulosis, rpt 3 yrs ([place])   ENTEROSCOPY N/A 08/12/2013   Procedure: ENTEROSCOPY;  Surgeon: Lafayette Dragon, MD;  Location: [facility];  Service: Endoscopy;  Laterality: N/A;   ESOPHAGOGASTRODUODENOSCOPY     left knee arthroscopy     LITHOTRIPSY     x 2   LUNG BIOPSY Right 06/29/2013   Procedure: LUNG BIOPSY;  Surgeon: Melrose Nakayama, MD;  Location: [facility];  Service: Thoracic;  Laterality: Right;   TCS     TUBAL LIGATION     VIDEO ASSISTED  THORACOSCOPY Right 06/29/2013   Procedure: VIDEO ASSISTED THORACOSCOPY;  Surgeon: Melrose Nakayama, MD;  Location: [place];  Service: Thoracic;  Laterality: Right;    Family History  Problem Relation Age of Onset   Rheum arthritis Mother    Rheum arthritis Sister    Prostate cancer Brother    Heart disease Brother    Heart disease Maternal Grandmother    Heart disease Maternal Grandfather    Uterine cancer Daughter    Colon cancer Neg Hx    Breast cancer Neg Hx     Allergies  Allergen Reactions   Adhesive [Tape]     Redness, bruising with any adhesives- bandaids, patches, etc.    Atovaquone Itching   Codeine Hives and Swelling   Demerol [Meperidine] Hives and Swelling   Hydrocodone Nausea Only   Trazodone And Nefazodone Itching   Sulfa Antibiotics Rash    Current Outpatient Medications on File Prior to Visit  Medication Sig Dispense Refill   acetaminophen (TYLENOL) 500 MG tablet Take 1,000 mg by mouth every 6 (six) hours as needed for moderate pain.     Ascorbic Acid (VITAMIN C PO) Take by mouth.     dapagliflozin propanediol (FARXIGA) 10 MG TABS tablet Take 1 tablet (10 mg total) by mouth daily. 90 tablet 3   ESBRIET 801 MG TABS TAKE 1 TABLET (801MG ) BY  MOUTH THREE TIMES DAILY  WITH FOOD 90 tablet 3   FIBER PO Take 1 tablet by mouth daily.     fluticasone (FLONASE) 50 MCG/ACT nasal spray Place 1 spray into both nostrils daily.     hydrochlorothiazide (MICROZIDE) 12.5 MG capsule TAKE 1 TABLET DAILY ALONG WITH ONE OF THE LOSARTAN 50 MG TABLETS 90 capsule 3   levothyroxine (SYNTHROID) 125 MCG tablet TAKE 1 TABLET BY MOUTH  DAILY BEFORE BREAKFAST 90 tablet 3   losartan (COZAAR) 50 MG tablet TAKE 1 TABLET DAILY ALONG WITH 1 OF THE HCTZ 12.5MG  TABLETS 90 tablet 3   metFORMIN (GLUCOPHAGE-XR) 500 MG 24 hr tablet Take 4 tablets (2,000 mg total) by mouth daily with breakfast. 360 tablet 3   montelukast (SINGULAIR) 10 MG tablet Take 1 tablet (10 mg total) by mouth at bedtime. 90  tablet 3   mycophenolate (CELLCEPT) 500 MG tablet TAKE 2 TABLETS BY MOUTH  TWICE DAILY 180 tablet 7   NON FORMULARY Place 4 L into the nose daily. 10 Liters with  exertion     nystatin cream (MYCOSTATIN) Apply 1 application topically 3 (three) times daily. 30 g 0   OneTouch Delica Lancets 16W MISC CHECK BLOOD SUGAR ONCE DAILY. 100 each 3   ONETOUCH ULTRA test strip CHECK BLOOD SUGAR DAILY. 50 strip 5   pantoprazole (PROTONIX) 40 MG tablet TAKE 1 TABLET BY MOUTH  TWICE DAILY 180 tablet 3   pioglitazone (ACTOS) 45 MG tablet Take 1 tablet (45 mg total) by mouth daily. 90 tablet 3   Pirfenidone 801 MG TABS SMARTSIG:Tablet(s) By Mouth     pravastatin (PRAVACHOL) 40 MG tablet TAKE 1 TABLET DAILY. 90 tablet 3   Probiotic Product (PROBIOTIC DAILY PO) Take by mouth.     verapamil (CALAN-SR) 180 MG CR tablet TAKE 1 TABLET BY MOUTH  DAILY 90 tablet 3   No current facility-administered medications on file prior to visit.    BP 130/70   Pulse 80   Temp 98 F (36.7 C) (Temporal)   Wt 230 lb 8 oz (104.6 kg)   SpO2 98%   BMI 39.26 kg/m  Objective:   Physical Exam Constitutional:      General: She is not in acute distress.    Appearance: She is not ill-appearing.  Cardiovascular:     Rate and Rhythm: Normal rate and regular rhythm.  Pulmonary:     Effort: Pulmonary effort is normal.  Abdominal:     General: Abdomen is flat.     Tenderness: There is no abdominal tenderness. There is left CVA tenderness. There is no right CVA tenderness.  Neurological:     Mental Status: She is alert.          Assessment & Plan:      This visit occurred during the SARS-CoV-2 public health emergency.  Safety protocols were in place, including screening questions prior to the visit, additional usage of staff PPE, and extensive cleaning of exam room while observing appropriate contact time as indicated for disinfecting solutions.

## 2021-01-06 LAB — URINE CULTURE
MICRO NUMBER:: 12021083
SPECIMEN QUALITY:: ADEQUATE

## 2021-01-08 ENCOUNTER — Other Ambulatory Visit: Payer: Self-pay

## 2021-01-08 ENCOUNTER — Ambulatory Visit
Admission: RE | Admit: 2021-01-08 | Discharge: 2021-01-08 | Disposition: A | Discharge status: Home / Self Care | Payer: Medicare Other | Source: Ambulatory Visit | Attending: Primary Care | Admitting: Primary Care

## 2021-01-08 DIAGNOSIS — R109 Unspecified abdominal pain: Secondary | ICD-10-CM | POA: Diagnosis not present

## 2021-01-08 DIAGNOSIS — N200 Calculus of kidney: Secondary | ICD-10-CM | POA: Diagnosis not present

## 2021-01-08 DIAGNOSIS — R300 Dysuria: Secondary | ICD-10-CM

## 2021-01-08 DIAGNOSIS — R1032 Left lower quadrant pain: Secondary | ICD-10-CM

## 2021-01-08 DIAGNOSIS — R10A0 Flank pain, unspecified side: Secondary | ICD-10-CM

## 2021-01-08 DIAGNOSIS — D3500 Benign neoplasm of unspecified adrenal gland: Secondary | ICD-10-CM | POA: Diagnosis not present

## 2021-01-08 DIAGNOSIS — N281 Cyst of kidney, acquired: Secondary | ICD-10-CM | POA: Diagnosis not present

## 2021-01-08 NOTE — Telephone Encounter (Signed)
Kaitlin Hamilton,  STAT CT renal stone ordered for this patient.

## 2021-01-09 ENCOUNTER — Other Ambulatory Visit: Payer: Self-pay

## 2021-01-09 ENCOUNTER — Other Ambulatory Visit: Payer: Self-pay | Admitting: Primary Care

## 2021-01-09 ENCOUNTER — Other Ambulatory Visit (INDEPENDENT_AMBULATORY_CARE_PROVIDER_SITE_OTHER): Payer: Medicare Other

## 2021-01-09 DIAGNOSIS — R109 Unspecified abdominal pain: Secondary | ICD-10-CM | POA: Diagnosis not present

## 2021-01-09 DIAGNOSIS — R10A0 Flank pain, unspecified side: Secondary | ICD-10-CM

## 2021-01-09 DIAGNOSIS — R1032 Left lower quadrant pain: Secondary | ICD-10-CM | POA: Diagnosis not present

## 2021-01-09 LAB — CBC WITH DIFFERENTIAL/PLATELET
Basophils Absolute: 0 10*3/uL (ref 0.0–0.1)
Basophils Relative: 0.5 % (ref 0.0–3.0)
Eosinophils Absolute: 0.2 10*3/uL (ref 0.0–0.7)
Eosinophils Relative: 2 % (ref 0.0–5.0)
HCT: 39.1 % (ref 36.0–46.0)
Hemoglobin: 12.6 g/dL (ref 12.0–15.0)
Lymphocytes Relative: 20.8 % (ref 12.0–46.0)
Lymphs Abs: 1.7 10*3/uL (ref 0.7–4.0)
MCHC: 32.3 g/dL (ref 30.0–36.0)
MCV: 93.4 fl (ref 78.0–100.0)
Monocytes Absolute: 0.6 10*3/uL (ref 0.1–1.0)
Monocytes Relative: 6.7 % (ref 3.0–12.0)
Neutro Abs: 5.8 10*3/uL (ref 1.4–7.7)
Neutrophils Relative %: 70 % (ref 43.0–77.0)
Platelets: 280 10*3/uL (ref 150.0–400.0)
RBC: 4.19 Mil/uL (ref 3.87–5.11)
RDW: 14.5 % (ref 11.5–15.5)
WBC: 8.3 10*3/uL (ref 4.0–10.5)

## 2021-01-09 LAB — BASIC METABOLIC PANEL
BUN: 16 mg/dL (ref 6–23)
CO2: 32 mEq/L (ref 19–32)
Calcium: 10.3 mg/dL (ref 8.4–10.5)
Chloride: 96 mEq/L (ref 96–112)
Creatinine, Ser: 0.74 mg/dL (ref 0.40–1.20)
GFR: 82.15 mL/min (ref >60.00)
Glucose, Bld: 161 mg/dL — ABNORMAL HIGH (ref 70–99)
Potassium: 4.1 mEq/L (ref 3.5–5.1)
Sodium: 138 mEq/L (ref 135–145)

## 2021-01-10 ENCOUNTER — Other Ambulatory Visit: Payer: Self-pay | Admitting: Primary Care

## 2021-01-10 ENCOUNTER — Other Ambulatory Visit: Payer: Self-pay

## 2021-01-10 ENCOUNTER — Other Ambulatory Visit (INDEPENDENT_AMBULATORY_CARE_PROVIDER_SITE_OTHER): Payer: Medicare Other

## 2021-01-10 DIAGNOSIS — R3129 Other microscopic hematuria: Secondary | ICD-10-CM

## 2021-01-10 DIAGNOSIS — R300 Dysuria: Secondary | ICD-10-CM | POA: Diagnosis not present

## 2021-01-10 DIAGNOSIS — R109 Unspecified abdominal pain: Secondary | ICD-10-CM | POA: Diagnosis not present

## 2021-01-10 DIAGNOSIS — R1032 Left lower quadrant pain: Secondary | ICD-10-CM | POA: Diagnosis not present

## 2021-01-10 DIAGNOSIS — R10A0 Flank pain, unspecified side: Secondary | ICD-10-CM

## 2021-01-10 MED ORDER — TIZANIDINE HCL 4 MG PO TABS: 4.0000 mg | ORAL_TABLET | Freq: Three times a day (TID) | ORAL | 0 refills | Status: DC | PRN

## 2021-01-10 NOTE — Addendum Note (Signed)
Addended by: Ellamae Sia on: 01/10/2021 04:11 PM   Modules accepted: Orders

## 2021-01-11 LAB — URINALYSIS, ROUTINE W REFLEX MICROSCOPIC
Bilirubin Urine: NEGATIVE
Ketones, ur: NEGATIVE
Leukocytes,Ua: NEGATIVE
Nitrite: NEGATIVE
Specific Gravity, Urine: 1.02 (ref 1.000–1.030)
Total Protein, Urine: NEGATIVE
Urine Glucose: >=1000 — AB
Urobilinogen, UA: 0.2 (ref 0.0–1.0)
pH: 5.5 (ref 5.0–8.0)

## 2021-01-12 ENCOUNTER — Other Ambulatory Visit: Payer: Self-pay | Admitting: Primary Care

## 2021-01-12 DIAGNOSIS — N3001 Acute cystitis with hematuria: Secondary | ICD-10-CM

## 2021-01-12 LAB — URINE CULTURE
MICRO NUMBER:: 12042624
SPECIMEN QUALITY:: ADEQUATE

## 2021-01-12 MED ORDER — CIPROFLOXACIN HCL 250 MG PO TABS: 250.0000 mg | ORAL_TABLET | Freq: Two times a day (BID) | ORAL | 0 refills | Status: AC

## 2021-01-17 ENCOUNTER — Ambulatory Visit
Admission: EM | Admit: 2021-01-17 | Discharge: 2021-01-17 | Disposition: A | Discharge status: Home / Self Care | Payer: Medicare Other | Attending: Physician Assistant | Admitting: Physician Assistant

## 2021-01-17 ENCOUNTER — Telehealth: Payer: Self-pay | Admitting: Family Medicine

## 2021-01-17 ENCOUNTER — Other Ambulatory Visit: Payer: Self-pay

## 2021-01-17 DIAGNOSIS — M5442 Lumbago with sciatica, left side: Secondary | ICD-10-CM

## 2021-01-17 MED ORDER — OXYCODONE HCL 5 MG PO TABS: 5.0000 mg | ORAL_TABLET | Freq: Four times a day (QID) | ORAL | 0 refills | Status: AC | PRN

## 2021-01-17 MED ORDER — DEXAMETHASONE SODIUM PHOSPHATE 10 MG/ML IJ SOLN
10.0000 mg | Freq: Once | INTRAMUSCULAR | Status: AC
  Administered 2021-01-17: 10 mg via INTRAMUSCULAR

## 2021-01-17 MED ORDER — BACLOFEN 10 MG PO TABS: 10.0000 mg | ORAL_TABLET | Freq: Three times a day (TID) | ORAL | 1 refills | Status: AC | PRN

## 2021-01-17 MED ORDER — PREDNISONE 10 MG PO TABS: ORAL_TABLET | ORAL | 0 refills | Status: DC

## 2021-01-17 NOTE — Telephone Encounter (Signed)
Called patient urine symptoms have gotten better. She has had increased left side back pain into left buttock and leg. Pain is 10/10 and increases when moving at all. She has been taking the Tizanidine ever 8 hours with tylenol and has not had any improvement. She has also used voltaren and heat pad.

## 2021-01-17 NOTE — Telephone Encounter (Signed)
Pt having lower back pain that runs down lt leg and muscle relaxant is not helping. Pain was worse last night and today and now pain level is 9; pt said worse when gets up and difficult to walk. Pt cannot wait for appt next wk and pt will go to [facility] now for eval and any needed testing. Pts daughter [name] got on phone and thinks pt just needs stronger muscle relaxant but advised pt would need to be eval before changing med. Dawn said pt wants to schedule appt with Dr [name] next wk but pt is going to go to [facility] in [place] now also. Pt scheduled appt with DR [name] on 01/23/21 at 8:20. Dawn said if pt is referred to specialist by UC they will call and cancel 01/23/21 appt. Sending note to Dr [name].

## 2021-01-17 NOTE — Discharge Instructions (Addendum)
BACK PAIN: Stressed avoiding painful activities . RICE (REST, ICE, COMPRESSION, ELEVATION) guidelines reviewed. May alternate ice and heat. Consider use of muscle rubs, Salonpas patches, etc. Use medications as directed including muscle relaxers if prescribed. Take anti-inflammatory medications as prescribed or OTC NSAIDs/Tylenol.  F/u with PCP in 7-10 days for reexamination, and please feel free to call or return to the urgent care at any time for any questions or concerns you may have and we will be happy to help you!   BACK PAIN RED FLAGS: If the back pain acutely worsens or there are any red flag symptoms such as numbness/tingling, leg weakness, saddle anesthesia, or loss of bowel/bladder control, go immediately to the ER. Follow up with [place] as scheduled or sooner if the pain does not begin to resolve or if it worsens before the follow up    As we discussed, you have already had pain for a week and if you continue to have pain despite the corticosteroids and pain medications after another week or 2 you may need a referral to orthopedics or have someone order an MRI for you.  Also, physical therapy may be considered.

## 2021-01-17 NOTE — Telephone Encounter (Signed)
She probably needs to be re-evaluated.. schedule appt next week.

## 2021-01-17 NOTE — Telephone Encounter (Signed)
Kaitlin Hamilton was seen last Friday by Anda Kraft and she gave her an antibiotics for the UTI and pain medicine but the pain medicine is not working and stayed up all night long, and wanted to know if she can get something else for the pain.

## 2021-01-17 NOTE — ED Provider Notes (Signed)
[facility]    CSN: [id number] Arrival date & time: 01/17/21  1512      History   Chief Complaint Chief Complaint  Patient presents with   Back Pain    HPI Kaitlin Hamilton is a 70 y.o. female presenting for 2-week history of left lower back pain with radiation to the left buttocks.  Patient states that now she has the pain radiating to the posterior leg just above her knee.  She says the pain is constant and throbbing.  The pain in her leg comes and goes but the back pain is constant.  Patient says that just before symptoms started she had a twisting injury when she was getting into a vehicle with her oxygen tank.  Denies trauma or fall.  No associated numbness, weakness or tingling.  No loss of bowel or bladder control.  Patient states that she has seen her PCP for the symptoms and had a work-up for renal stones including a CT scan that is negative.  Patient recently treated for UTI and states her urinary symptoms have resolved but over the past 3 to 4 days her back pain has worsened and she has more radicular symptoms.  Patient denies similar problem the past.  She says she has tried over-the-counter ibuprofen and Tylenol.  She says she is also used an ice pack, heating pad, and a family member has rubbed Voltaren into her lower back.  Patient also using tizanidine 3 times a day as prescribed by PCP.  She says she does not think that muscle relaxer are strong enough.  Patient states none of those methods have helped her symptoms and she is feeling persistently worse.  PMH significant for hypertension, hyperlipidemia, well-controlled type 2 diabetes, interstitial pulmonary disease on continuous oxygen, OSA (uses CPAP).  HPI  Past Medical History:  Diagnosis Date   Allergic rhinitis    uses Flonase daily   Anesthesia complication    Per pt/ past endoscopy in 2015, the anesthetic spray in back throat caused her to have breathing problems   Anxiety    Bronchitis    Chicken pox     Constipation    takes Fiber daily   Depression    Diverticulosis    Dysphagia    GERD (gastroesophageal reflux disease)    takes Protonix daily   H/O hiatal hernia    Hemorrhoids    History of bronchitis    2013   History of colon polyps    History of kidney stones    Hyperlipidemia    lost 43 pounds and no meds required at present   Hypertension    takes Verapamil and HCTZ daily   Hypokalemia    Hypothyroidism (acquired)    takes Synthroid daily   Insomnia    doesn't take any meds for this   Joint swelling    left thumb   Migraines    last one about a month ago   Non-insulin dependent type 2 diabetes mellitus ([place])    takes Glipizide daily   OA (osteoarthritis)    left knee   Oxygen deficiency    Oxygen dependent    Pt is on continuous O2 at 3 liters!   Pneumonia    last time in 2006   PONV (postoperative nausea and vomiting)    Shortness of breath    with exertion;takes Singulair daily as well as Flonase   Urinary frequency     Patient Active Problem List   Diagnosis Date Noted  Flank pain 01/04/2021   Nausea vomiting and diarrhea 02/21/2019   Controlled diabetes mellitus type 2 with complications ([place]) 09/32/3557   MDD (recurrent major depressive disorder) in remission ([place]) 12/08/2017   Chronic insomnia 12/08/2017   Candidal intertrigo 07/08/2016   Right sciatic nerve pain 03/09/2015   Chronic pain of left thumb 03/09/2015   Allergic rhinitis 10/06/2014   Chronic hypoxemic respiratory failure ([place]) 01/31/2014   Cystocele 11/11/2013   Hypertension associated with diabetes ([place]) 08/25/2013   Hyperlipidemia associated with type 2 diabetes mellitus ([place]) 08/25/2013   Common migraine 08/25/2013   Hypothyroidism 08/25/2013   OSA (obstructive sleep apnea) 08/22/2013   GERD (gastroesophageal reflux disease) 06/01/2013   Esophageal dysmotility 05/17/2013   UIP (usual interstitial pneumonitis) ([place]) 10/19/2012    Past Surgical History:  Procedure  Laterality Date   ABDOMINAL EXPLORATION SURGERY     For Ovarian Cyst    APPENDECTOMY     CHOLECYSTECTOMY     COLONOSCOPY  05/2013   TA, severe diverticulosis, rpt 3 yrs ([place])   ENTEROSCOPY N/A 08/12/2013   Procedure: ENTEROSCOPY;  Surgeon: Lafayette Dragon, MD;  Location: [facility];  Service: Endoscopy;  Laterality: N/A;   ESOPHAGOGASTRODUODENOSCOPY     left knee arthroscopy     LITHOTRIPSY     x 2   LUNG BIOPSY Right 06/29/2013   Procedure: LUNG BIOPSY;  Surgeon: Melrose Nakayama, MD;  Location: [facility];  Service: Thoracic;  Laterality: Right;   TCS     TUBAL LIGATION     VIDEO ASSISTED THORACOSCOPY Right 06/29/2013   Procedure: VIDEO ASSISTED THORACOSCOPY;  Surgeon: Melrose Nakayama, MD;  Location: [facility];  Service: Thoracic;  Laterality: Right;    OB History   No obstetric history on file.      Home Medications    Prior to Admission medications   Medication Sig Start Date End Date Taking? Authorizing Provider  acetaminophen (TYLENOL) 500 MG tablet Take 1,000 mg by mouth every 6 (six) hours as needed for moderate pain.   Yes [provider]  Ascorbic Acid (VITAMIN C PO) Take by mouth.   Yes [provider]  baclofen (LIORESAL) 10 MG tablet Take 1 tablet (10 mg total) by mouth 3 (three) times daily as needed for up to 15 days for muscle spasms. 01/17/21 02/01/21 Yes Laurene Footman B, PA-C  dapagliflozin propanediol (FARXIGA) 10 MG TABS tablet Take 1 tablet (10 mg total) by mouth daily. 04/24/20  Yes Bedsole, Amy E, MD  ESBRIET 801 MG TABS TAKE 1 TABLET (801MG ) BY  MOUTH THREE TIMES DAILY  WITH FOOD 12/05/20  Yes Mannam, Praveen, MD  FIBER PO Take 1 tablet by mouth daily.   Yes [provider]  fluticasone (FLONASE) 50 MCG/ACT nasal spray Place 1 spray into both nostrils daily.   Yes [provider]  hydrochlorothiazide (MICROZIDE) 12.5 MG capsule TAKE 1 TABLET DAILY ALONG WITH ONE OF THE LOSARTAN 50 MG TABLETS 03/01/20  Yes Bedsole, Amy E,  MD  levothyroxine (SYNTHROID) 125 MCG tablet TAKE 1 TABLET BY MOUTH  DAILY BEFORE BREAKFAST 10/01/20  Yes Bedsole, Amy E, MD  losartan (COZAAR) 50 MG tablet TAKE 1 TABLET DAILY ALONG WITH 1 OF THE HCTZ 12.5MG  TABLETS 03/01/20  Yes Bedsole, Amy E, MD  metFORMIN (GLUCOPHAGE-XR) 500 MG 24 hr tablet Take 4 tablets (2,000 mg total) by mouth daily with breakfast. 04/24/20  Yes Bedsole, Amy E, MD  montelukast (SINGULAIR) 10 MG tablet Take 1 tablet (10 mg total) by mouth  at bedtime. 03/19/20  Yes Bedsole, Amy E, MD  mycophenolate (CELLCEPT) 500 MG tablet TAKE 2 TABLETS BY MOUTH  TWICE DAILY 08/17/20  Yes Mannam, Praveen, MD  NON FORMULARY Place 4 L into the nose daily. 10 Liters with exertion   Yes [provider]  nystatin cream (MYCOSTATIN) Apply 1 application topically 3 (three) times daily. 09/13/20  Yes Bedsole, Amy Johnette Abraham, MD  OneTouch Delica Lancets 03K MISC CHECK BLOOD SUGAR ONCE DAILY. 11/11/19  Yes Bedsole, Amy Johnette Abraham, MD  ONETOUCH ULTRA test strip CHECK BLOOD SUGAR DAILY. 08/03/20  Yes Bedsole, Amy E, MD  oxyCODONE (ROXICODONE) 5 MG immediate release tablet Take 1 tablet (5 mg total) by mouth every 6 (six) hours as needed for up to 5 days for severe pain. 01/17/21 01/22/21 Yes Laurene Footman B, PA-C  pantoprazole (PROTONIX) 40 MG tablet TAKE 1 TABLET BY MOUTH  TWICE DAILY 10/01/20  Yes Bedsole, Amy E, MD  pioglitazone (ACTOS) 45 MG tablet Take 1 tablet (45 mg total) by mouth daily. 03/19/20  Yes Bedsole, Amy E, MD  Pirfenidone 801 MG TABS SMARTSIG:Tablet(s) By Mouth 12/06/20  Yes [provider]  pravastatin (PRAVACHOL) 40 MG tablet TAKE 1 TABLET DAILY. 02/16/20  Yes Bedsole, Amy E, MD  predniSONE (DELTASONE) 10 MG tablet Take 6 tabs p.o. on the first day and decrease by 1 tablet daily until complete 01/17/21  Yes Laurene Footman B, PA-C  Probiotic Product (PROBIOTIC DAILY PO) Take by mouth.   Yes [provider]  tamsulosin (FLOMAX) 0.4 MG CAPS capsule Take 1 capsule (0.4 mg total) by mouth daily.  For urine flow. 01/04/21  Yes Pleas Koch, NP  verapamil (CALAN-SR) 180 MG CR tablet TAKE 1 TABLET BY MOUTH  DAILY 10/01/20  Yes Bedsole, Amy E, MD    Family History Family History  Problem Relation Age of Onset   Rheum arthritis Mother    Rheum arthritis Sister    Prostate cancer Brother    Heart disease Brother    Heart disease Maternal Grandmother    Heart disease Maternal Grandfather    Uterine cancer Daughter    Colon cancer Neg Hx    Breast cancer Neg Hx     Social History Social History   Tobacco Use   Smoking status: Never   Smokeless tobacco: Never  Vaping Use   Vaping Use: Never used  Substance Use Topics   Alcohol use: No    Alcohol/week: 0.0 standard drinks   Drug use: No     Allergies   Adhesive [tape], Atovaquone, Codeine, Demerol [meperidine], Hydrocodone, Trazodone and nefazodone, and Sulfa antibiotics   Review of Systems Review of Systems  Constitutional:  Negative for fatigue and fever.  Gastrointestinal:  Negative for abdominal pain.  Genitourinary:  Negative for dysuria, frequency and hematuria.  Musculoskeletal:  Positive for back pain. Negative for arthralgias.  Skin:  Negative for rash.  Neurological:  Negative for weakness and numbness.    Physical Exam Triage Vital Signs ED Triage Vitals  Enc Vitals Group     BP 01/17/21 1534 (!) 159/66     Pulse Rate 01/17/21 1534 94     Resp 01/17/21 1534 16     Temp 01/17/21 1534 98.2 F (36.8 C)     Temp Source 01/17/21 1534 Oral     SpO2 01/17/21 1534 100 %     Weight 01/17/21 1532 230 lb 9.6 oz (104.6 kg)     Height 01/17/21 1532 5' 4.25" (1.632 m)  Head Circumference --      Peak Flow --      Pain Score 01/17/21 1532 9     Pain Loc --      Pain Edu? --      Excl. in [place]? --    No data found.  Updated Vital Signs BP (!) 159/66 (BP Location: Right Arm)   Pulse 94   Temp 98.2 F (36.8 C) (Oral)   Resp 16   Ht 5' 4.25" (1.632 m)   Wt 230 lb 9.6 oz (104.6 kg)   SpO2 100%    BMI 39.28 kg/m       Physical Exam Vitals and nursing note reviewed.  Constitutional:      General: She is not in acute distress.    Appearance: Normal appearance. She is obese. She is not ill-appearing or toxic-appearing.  HENT:     Head: Normocephalic and atraumatic.  Eyes:     General: No scleral icterus.       Right eye: No discharge.        Left eye: No discharge.     Conjunctiva/sclera: Conjunctivae normal.  Cardiovascular:     Rate and Rhythm: Normal rate and regular rhythm.     Heart sounds: Normal heart sounds.  Pulmonary:     Effort: Pulmonary effort is normal. No respiratory distress.     Breath sounds: Normal breath sounds.  Abdominal:     Palpations: Abdomen is soft.     Tenderness: There is no abdominal tenderness. There is no right CVA tenderness or left CVA tenderness.  Musculoskeletal:     Cervical back: Neck supple.     Lumbar back: Spasms and tenderness (TTP L4-S1, left paralumbar muscles, left buttocks) present. Decreased range of motion. Positive left straight leg raise test.  Skin:    General: Skin is dry.  Neurological:     General: No focal deficit present.     Mental Status: She is alert. Mental status is at baseline.     Motor: No weakness.     Gait: Gait abnormal.  Psychiatric:        Mood and Affect: Mood normal.        Behavior: Behavior normal.        Thought Content: Thought content normal.     UC Treatments / Results  Labs (all labs ordered are listed, but only abnormal results are displayed) Labs Reviewed - No data to display  EKG   Radiology No results found.  Procedures Procedures (including critical care time)  Medications Ordered in UC Medications  dexamethasone (DECADRON) injection 10 mg (10 mg Intramuscular Given 01/17/21 1557)    Initial Impression / Assessment and Plan / UC Course  I have reviewed the triage vital signs and the nursing notes.  Pertinent labs & imaging results that were available during my care of  the patient were reviewed by me and considered in my medical decision making (see chart for details).  70 year old female presenting with left sided back pain with radiation to the left buttocks and just above the left posterior knee.  This is been ongoing for 2 weeks.  Patient declines a urinalysis today.  Exam significant for tenderness of the L4-L5 and L5-S1 region, left paravertebral muscles and left buttocks.  Positive straight leg raise on the left.  Negative on the right.  Decreased range of motion of the lumbar spine, especially flexion.  5 out of 5 strength bilateral lower extremities and good sensation to light touch.  Patient symptoms consistent with lumbar radiculopathy/sciatica.  Patient given dexamethasone injection of 10 mg at this time since she is not had any improvement with over-the-counter NSAIDs or Tylenol.  Advised her to start the prednisone tomorrow.  Patient's BP is little elevated 159/66 in clinic but she says it is normal at home she believes it is due to her pain.  She is also diabetic but her last A1c has been under 7.  Advised her that the prednisone can increase her blood sugar and also her blood pressure so she could keep a close eye on them.  I have advised her to stop the tizanidine and prescribed baclofen.  Advise she can take Tylenol for pain.  Prescribed her oxycodone for any severe or breakthrough pain.  Patient has allergy to codeine and hydrocodone.  She says that she has taken oxycodone before.  Controlled substance database reviewed.  Patient has appointment with her PCP in 6 days.  Advised her to keep that appointment.  ED precautions reviewed with patient and advised her to the ED sooner if she develops any of those symptoms.  Final Clinical Impressions(s) / UC Diagnoses   Final diagnoses:  Acute left-sided low back pain with left-sided sciatica     Discharge Instructions      BACK PAIN: Stressed avoiding painful activities . RICE (REST, ICE,  COMPRESSION, ELEVATION) guidelines reviewed. May alternate ice and heat. Consider use of muscle rubs, Salonpas patches, etc. Use medications as directed including muscle relaxers if prescribed. Take anti-inflammatory medications as prescribed or OTC NSAIDs/Tylenol.  F/u with PCP in 7-10 days for reexamination, and please feel free to call or return to the urgent care at any time for any questions or concerns you may have and we will be happy to help you!   BACK PAIN RED FLAGS: If the back pain acutely worsens or there are any red flag symptoms such as numbness/tingling, leg weakness, saddle anesthesia, or loss of bowel/bladder control, go immediately to the ER. Follow up with [place] as scheduled or sooner if the pain does not begin to resolve or if it worsens before the follow up    As we discussed, you have already had pain for a week and if you continue to have pain despite the corticosteroids and pain medications after another week or 2 you may need a referral to orthopedics or have someone order an MRI for you.  Also, physical therapy may be considered.     ED Prescriptions     Medication Sig Dispense Auth. Provider   baclofen (LIORESAL) 10 MG tablet Take 1 tablet (10 mg total) by mouth 3 (three) times daily as needed for up to 15 days for muscle spasms. 30 each Danton Clap, PA-C   predniSONE (DELTASONE) 10 MG tablet Take 6 tabs p.o. on the first day and decrease by 1 tablet daily until complete 21 tablet Laurene Footman B, PA-C   oxyCODONE (ROXICODONE) 5 MG immediate release tablet Take 1 tablet (5 mg total) by mouth every 6 (six) hours as needed for up to 5 days for severe pain. 20 tablet Danton Clap, PA-C      I have reviewed the PDMP during this encounter.   Danton Clap, PA-C 01/17/21 1620

## 2021-01-17 NOTE — Telephone Encounter (Signed)
At this point, since I saw her almost 2 weeks ago. I will defer symptoms to her PCP as she knows the patient better.  I have treated the issue for which she saw me for.

## 2021-01-17 NOTE — Telephone Encounter (Signed)
Noted  

## 2021-01-17 NOTE — ED Triage Notes (Signed)
Patient complains of back pain that radiates down her left buttock and left leg.

## 2021-01-23 ENCOUNTER — Ambulatory Visit (INDEPENDENT_AMBULATORY_CARE_PROVIDER_SITE_OTHER): Payer: Medicare Other | Admitting: Family Medicine

## 2021-01-23 ENCOUNTER — Other Ambulatory Visit: Payer: Self-pay

## 2021-01-23 ENCOUNTER — Encounter: Payer: Self-pay | Admitting: Family Medicine

## 2021-01-23 VITALS — BP 140/60 | HR 78 | Temp 97.9°F | Ht 65.5 in | Wt 239.0 lb

## 2021-01-23 DIAGNOSIS — N390 Urinary tract infection, site not specified: Secondary | ICD-10-CM | POA: Diagnosis not present

## 2021-01-23 DIAGNOSIS — M5416 Radiculopathy, lumbar region: Secondary | ICD-10-CM | POA: Diagnosis not present

## 2021-01-23 DIAGNOSIS — B9689 Other specified bacterial agents as the cause of diseases classified elsewhere: Secondary | ICD-10-CM | POA: Diagnosis not present

## 2021-01-23 DIAGNOSIS — R350 Frequency of micturition: Secondary | ICD-10-CM

## 2021-01-23 DIAGNOSIS — E118 Type 2 diabetes mellitus with unspecified complications: Secondary | ICD-10-CM | POA: Diagnosis not present

## 2021-01-23 LAB — POC URINALSYSI DIPSTICK (AUTOMATED)
Bilirubin, UA: NEGATIVE
Blood, UA: NEGATIVE
Glucose, UA: POSITIVE — AB
Ketones, UA: NEGATIVE
Leukocytes, UA: NEGATIVE
Nitrite, UA: NEGATIVE
Protein, UA: POSITIVE — AB
Spec Grav, UA: 1.02 (ref 1.010–1.025)
Urobilinogen, UA: 0.2 E.U./dL
pH, UA: 5.5 (ref 5.0–8.0)

## 2021-01-23 MED ORDER — DICLOFENAC SODIUM 75 MG PO TBEC: 75.0000 mg | DELAYED_RELEASE_TABLET | Freq: Two times a day (BID) | ORAL | 0 refills | Status: DC

## 2021-01-23 NOTE — Patient Instructions (Addendum)
Heat on low back. Start home exercises Start diclofenac twice daily for pain in low back , continue baclofen for muscle spasm. We will call with urine culture results. Keep up with water intake, avoid bladder irritants.  Follow up in 2 weeks if pain not continuing to improve.

## 2021-01-23 NOTE — Assessment & Plan Note (Signed)
Back pain  At this point appears more likely due to  scitica and muscle spasm than any urinary of kidney issue.  Kaitlin Hamilton has had significant improvement with prednisone but not resolved.. start home PT ( info given), heat, massage and start diclofenac NSAID BID.  Can continue baclofen for muscle relaxant.   If not continuing to improve in next 2-3 weeks.. Kaitlin Hamilton will return for X-ray lumbar spine and possible referral to formal PT.

## 2021-01-23 NOTE — Assessment & Plan Note (Signed)
Worsened with recent prednisone course but now improving again . Lab Results  Component Value Date   HGBA1C 6.7 (H) 07/26/2020

## 2021-01-23 NOTE — Assessment & Plan Note (Signed)
UA clear today but will send for culture given recurrence of frequency. And recent Klebsiella UTI.  Most likely frequency due to glucose in urine and possible bladder irritants. Increase water and avoid bladder irritants.

## 2021-01-23 NOTE — Progress Notes (Signed)
Patient ID: Kaitlin Hamilton, female    DOB: September 22, 1950, 70 y.o.   MRN: [id number]  This visit was conducted in person.  Pulse 78   Temp 97.9 F (36.6 C) (Temporal)   Ht 5' 5.5" (1.664 m)   Wt 239 lb (108.4 kg)   SpO2 94%   BMI 39.17 kg/m    CC: Chief Complaint  Patient presents with   Back Pain    Low back pain that radiates down her left leg   Urinary Frequency    Subjective:   HPI: Kaitlin Hamilton is a 70 y.o. female with history of HTN, UIP on oxygen presenting on 01/23/2021 for Back Pain (Low back pain that radiates down her left leg) and Urinary Frequency  She reports new onset pain in left lower back , radiation to left leg in last 3 weeks. She was seen [facility] for similar pain. Note reviewed in detail. She was treated with  IM of steroid, Baclofen, prednisone taper 60 to 10 mg, and oxycodone prn. She feels pain started after she almost fell and caught herself as well as another low back strain after lifting oxygen tank.     6/17 and 01/12/2021 OV with Allie Bossier for similar symptoms ( with dysuria)..urine culture showed Klebsiella.. she was treated with Cipro 250 mg BID x 3 days.  Treated initially for possible kidney stones ( CT scan showed non obstructing renal stones). Symptoms resolved until  last 2 days    Today she reports in  last 2 days.. the urinary frequency with some loss of control urine and incontinence.  No dysuria and no fever.  She reports prednisone improved left low back pain, but still some pain and radiation.  No new numbness, no weakness. Leg occ gives out from pain. 6/10 on pain scale. Pain worse with bending over. Baclofen and tylenol helping with pain.  Urinalysis 01/23/21   No wbc, no nitrite, no blood  Positive for protein and glucose.   FBS at home have been 180s on prednisone but now back at 115-140  Relevant past medical, surgical, family and social history reviewed and updated as indicated. Interim medical history since  our last visit reviewed. Allergies and medications reviewed and updated. Outpatient Medications Prior to Visit  Medication Sig Dispense Refill   acetaminophen (TYLENOL) 500 MG tablet Take 1,000 mg by mouth every 6 (six) hours as needed for moderate pain.     Ascorbic Acid (VITAMIN C PO) Take by mouth.     baclofen (LIORESAL) 10 MG tablet Take 1 tablet (10 mg total) by mouth 3 (three) times daily as needed for up to 15 days for muscle spasms. 30 each 1   dapagliflozin propanediol (FARXIGA) 10 MG TABS tablet Take 1 tablet (10 mg total) by mouth daily. 90 tablet 3   ESBRIET 801 MG TABS TAKE 1 TABLET (801MG ) BY  MOUTH THREE TIMES DAILY  WITH FOOD 90 tablet 3   FIBER PO Take 1 tablet by mouth daily.     fluticasone (FLONASE) 50 MCG/ACT nasal spray Place 1 spray into both nostrils daily.     hydrochlorothiazide (MICROZIDE) 12.5 MG capsule TAKE 1 TABLET DAILY ALONG WITH ONE OF THE LOSARTAN 50 MG TABLETS 90 capsule 3   levothyroxine (SYNTHROID) 125 MCG tablet TAKE 1 TABLET BY MOUTH  DAILY BEFORE BREAKFAST 90 tablet 3   losartan (COZAAR) 50 MG tablet TAKE 1 TABLET DAILY ALONG WITH 1 OF THE HCTZ 12.5MG  TABLETS 90 tablet 3  metFORMIN (GLUCOPHAGE-XR) 500 MG 24 hr tablet Take 4 tablets (2,000 mg total) by mouth daily with breakfast. 360 tablet 3   montelukast (SINGULAIR) 10 MG tablet Take 1 tablet (10 mg total) by mouth at bedtime. 90 tablet 3   mycophenolate (CELLCEPT) 500 MG tablet TAKE 2 TABLETS BY MOUTH  TWICE DAILY 180 tablet 7   NON FORMULARY Place 4 L into the nose daily. 10 Liters with exertion     nystatin cream (MYCOSTATIN) Apply 1 application topically 3 (three) times daily. 30 g 0   OneTouch Delica Lancets 77O MISC CHECK BLOOD SUGAR ONCE DAILY. 100 each 3   ONETOUCH ULTRA test strip CHECK BLOOD SUGAR DAILY. 50 strip 5   pantoprazole (PROTONIX) 40 MG tablet TAKE 1 TABLET BY MOUTH  TWICE DAILY 180 tablet 3   pioglitazone (ACTOS) 45 MG tablet Take 1 tablet (45 mg total) by mouth daily. 90 tablet 3    pravastatin (PRAVACHOL) 40 MG tablet TAKE 1 TABLET DAILY. 90 tablet 3   Probiotic Product (PROBIOTIC DAILY PO) Take by mouth.     tamsulosin (FLOMAX) 0.4 MG CAPS capsule Take 1 capsule (0.4 mg total) by mouth daily. For urine flow. 30 capsule 0   verapamil (CALAN-SR) 180 MG CR tablet TAKE 1 TABLET BY MOUTH  DAILY 90 tablet 3   Pirfenidone 801 MG TABS SMARTSIG:Tablet(s) By Mouth     predniSONE (DELTASONE) 10 MG tablet Take 6 tabs p.o. on the first day and decrease by 1 tablet daily until complete 21 tablet 0   No facility-administered medications prior to visit.     Per HPI unless specifically indicated in ROS section below Review of Systems  Constitutional:  Negative for fatigue and fever.  HENT:  Negative for ear pain.   Eyes:  Negative for pain.  Respiratory:  Negative for chest tightness and shortness of breath.   Cardiovascular:  Negative for chest pain, palpitations and leg swelling.  Gastrointestinal:  Negative for abdominal pain.  Genitourinary:  Negative for dysuria.  Objective:  Pulse 78   Temp 97.9 F (36.6 C) (Temporal)   Ht 5' 5.5" (1.664 m)   Wt 239 lb (108.4 kg)   SpO2 94%   BMI 39.17 kg/m   Wt Readings from Last 3 Encounters:  01/23/21 239 lb (108.4 kg)  01/17/21 230 lb 9.6 oz (104.6 kg)  01/04/21 230 lb 8 oz (104.6 kg)      Physical Exam Constitutional:      General: She is not in acute distress.    Appearance: Normal appearance. She is well-developed. She is not ill-appearing or toxic-appearing.  HENT:     Head: Normocephalic.     Right Ear: Hearing, tympanic membrane, ear canal and external ear normal. Tympanic membrane is not erythematous, retracted or bulging.     Left Ear: Hearing, tympanic membrane, ear canal and external ear normal. Tympanic membrane is not erythematous, retracted or bulging.     Nose: No mucosal edema or rhinorrhea.     Right Sinus: No maxillary sinus tenderness or frontal sinus tenderness.     Left Sinus: No maxillary sinus  tenderness or frontal sinus tenderness.     Mouth/Throat:     Pharynx: Uvula midline.  Eyes:     General: Lids are normal. Lids are everted, no foreign bodies appreciated.     Conjunctiva/sclera: Conjunctivae normal.     Pupils: Pupils are equal, round, and reactive to light.  Neck:     Thyroid: No thyroid mass or thyromegaly.  Vascular: No carotid bruit.     Trachea: Trachea normal.  Cardiovascular:     Rate and Rhythm: Normal rate and regular rhythm.     Pulses: Normal pulses.     Heart sounds: Normal heart sounds, S1 normal and S2 normal. No murmur heard.   No friction rub. No gallop.  Pulmonary:     Effort: Pulmonary effort is normal. No tachypnea or respiratory distress.     Breath sounds: Normal breath sounds. No decreased breath sounds, wheezing, rhonchi or rales.  Abdominal:     General: Bowel sounds are normal.     Palpations: Abdomen is soft.     Tenderness: There is no abdominal tenderness.  Musculoskeletal:     Cervical back: Normal, normal range of motion and neck supple.     Thoracic back: Tenderness present. No bony tenderness. Decreased range of motion.     Lumbar back: Tenderness and bony tenderness present. Decreased range of motion. Positive left straight leg raise test.  Skin:    General: Skin is warm and dry.     Findings: No rash.  Neurological:     Mental Status: She is alert.  Psychiatric:        Mood and Affect: Mood is not anxious or depressed.        Speech: Speech normal.        Behavior: Behavior normal. Behavior is cooperative.        Thought Content: Thought content normal.        Judgment: Judgment normal.      Results for orders placed or performed in visit on 01/10/21  Urine Culture   Specimen: Urine  Result Value Ref Range   MICRO NUMBER: [id number]    SPECIMEN QUALITY: Adequate    Sample Source NOT GIVEN    STATUS: FINAL    ISOLATE 1: Klebsiella pneumoniae (A)       Susceptibility   Klebsiella pneumoniae - URINE CULTURE, REFLEX     AMOX/CLAVULANIC <=2 Sensitive     AMPICILLIN >=32 Resistant     AMPICILLIN/SULBACTAM 8 Sensitive     CEFAZOLIN* <=4 Not Reportable      * For infections other than uncomplicated UTI caused by E. coli, K. pneumoniae or P. mirabilis: Cefazolin is resistant if MIC > or = 8 mcg/mL. (Distinguishing susceptible versus intermediate for isolates with MIC < or = 4 mcg/mL requires additional testing.) For uncomplicated UTI caused by E. coli, K. pneumoniae or P. mirabilis: Cefazolin is susceptible if MIC <32 mcg/mL and predicts susceptible to the oral agents cefaclor, cefdinir, cefpodoxime, cefprozil, cefuroxime, cephalexin and loracarbef.     CEFTAZIDIME <=1 Sensitive     CEFEPIME <=1 Sensitive     CEFTRIAXONE <=1 Sensitive     CIPROFLOXACIN <=0.25 Sensitive     LEVOFLOXACIN <=0.12 Sensitive     GENTAMICIN <=1 Sensitive     IMIPENEM <=0.25 Sensitive     NITROFURANTOIN 64 Intermediate     PIP/TAZO <=4 Sensitive     TOBRAMYCIN <=1 Sensitive     TRIMETH/SULFA* <=20 Sensitive      * For infections other than uncomplicated UTI caused by E. coli, K. pneumoniae or P. mirabilis: Cefazolin is resistant if MIC > or = 8 mcg/mL. (Distinguishing susceptible versus intermediate for isolates with MIC < or = 4 mcg/mL requires additional testing.) For uncomplicated UTI caused by E. coli, K. pneumoniae or P. mirabilis: Cefazolin is susceptible if MIC <32 mcg/mL and predicts susceptible to the oral agents cefaclor, cefdinir, cefpodoxime, cefprozil, cefuroxime,  cephalexin and loracarbef. Legend: S = Susceptible  I = Intermediate R = Resistant  NS = Not susceptible * = Not tested  NR = Not reported **NN = See antimicrobic comments   Urinalysis, Routine w reflex microscopic  Result Value Ref Range   Color, Urine YELLOW Yellow;Lt. Yellow;Straw;Dark Yellow;Amber;Green;Red;Brown   APPearance CLEAR Clear;Turbid;Slightly Cloudy;Cloudy   Specific Gravity, Urine 1.020 1.000 - 1.030   pH 5.5 5.0 - 8.0    Total Protein, Urine NEGATIVE Negative   Urine Glucose >=1000 (A) Negative   Ketones, ur NEGATIVE Negative   Bilirubin Urine NEGATIVE Negative   Hgb urine dipstick SMALL (A) Negative   Urobilinogen, UA 0.2 0.0 - 1.0   Leukocytes,Ua NEGATIVE Negative   Nitrite NEGATIVE Negative   WBC, UA 3-6/hpf (A) 0-2/hpf   RBC / HPF 0-2/hpf 0-2/hpf   Squamous Epithelial / LPF Rare(0-4/hpf) Rare(0-4/hpf)   Bacteria, UA Few(10-50/hpf) (A) None    This visit occurred during the SARS-CoV-2 public health emergency.  Safety protocols were in place, including screening questions prior to the visit, additional usage of staff PPE, and extensive cleaning of exam room while observing appropriate contact time as indicated for disinfecting solutions.   COVID 19 screen:  No recent travel or known exposure to COVID19 The patient denies respiratory symptoms of COVID 19 at this time. The importance of social distancing was discussed today.   Assessment and Plan Problem List Items Addressed This Visit     Controlled diabetes mellitus type 2 with complications ([place]) (Chronic)    Worsened with recent prednisone course but now improving again . Lab Results  Component Value Date   HGBA1C 6.7 (H) 07/26/2020         Lumbar back pain with radiculopathy affecting left lower extremity - Primary     Back pain  At this point appears more likely due to  scitica and muscle spasm than any urinary of kidney issue.  She has had significant improvement with prednisone but not resolved.. start home PT ( info given), heat, massage and start diclofenac NSAID BID.  Can continue baclofen for muscle relaxant.   If not continuing to improve in next 2-3 weeks.. she will return for X-ray lumbar spine and possible referral to formal PT.       Relevant Medications   diclofenac (VOLTAREN) 75 MG EC tablet   Urinary frequency    UA clear today but will send for culture given recurrence of frequency. And recent Klebsiella UTI.  Most  likely frequency due to glucose in urine and possible bladder irritants. Increase water and avoid bladder irritants.       Relevant Orders   POCT Urinalysis Dipstick (Automated) (Completed)   Urine Culture   UTI due to Klebsiella species     Symptoms had resolved following Cipro.             Meds ordered this encounter  Medications   diclofenac (VOLTAREN) 75 MG EC tablet    Sig: Take 1 tablet (75 mg total) by mouth 2 (two) times daily.    Dispense:  30 tablet    Refill:  0     [name], MD

## 2021-01-23 NOTE — Assessment & Plan Note (Signed)
Symptoms had resolved following Cipro.

## 2021-01-25 ENCOUNTER — Other Ambulatory Visit: Payer: Self-pay | Admitting: Family Medicine

## 2021-01-25 LAB — URINE CULTURE
MICRO NUMBER:: 12086831
SPECIMEN QUALITY:: ADEQUATE

## 2021-01-25 MED ORDER — CEPHALEXIN 500 MG PO CAPS: 500.0000 mg | ORAL_CAPSULE | Freq: Three times a day (TID) | ORAL | 0 refills | Status: DC

## 2021-02-01 DIAGNOSIS — J8489 Other specified interstitial pulmonary diseases: Secondary | ICD-10-CM | POA: Diagnosis not present

## 2021-02-01 DIAGNOSIS — G4733 Obstructive sleep apnea (adult) (pediatric): Secondary | ICD-10-CM | POA: Diagnosis not present

## 2021-02-08 ENCOUNTER — Telehealth: Payer: Self-pay | Admitting: Family Medicine

## 2021-02-08 DIAGNOSIS — E118 Type 2 diabetes mellitus with unspecified complications: Secondary | ICD-10-CM

## 2021-02-08 NOTE — Telephone Encounter (Signed)
-----   Message from Cloyd Stagers, RT sent at 01/28/2021  9:44 AM EDT ----- Regarding: Lab Orders for Tuesday 7.26.2022 Please place lab orders for Tuesday 7.26.2022, office visit for 6 month f/u on Tuesday 8.2.2022 Thank you, Dyke Maes RT(R)

## 2021-02-11 ENCOUNTER — Other Ambulatory Visit: Payer: Self-pay | Admitting: Family Medicine

## 2021-02-12 ENCOUNTER — Other Ambulatory Visit (INDEPENDENT_AMBULATORY_CARE_PROVIDER_SITE_OTHER): Payer: Medicare Other

## 2021-02-12 ENCOUNTER — Other Ambulatory Visit: Payer: Self-pay

## 2021-02-12 DIAGNOSIS — E118 Type 2 diabetes mellitus with unspecified complications: Secondary | ICD-10-CM

## 2021-02-12 LAB — COMPREHENSIVE METABOLIC PANEL
ALT: 10 U/L (ref 0–35)
AST: 12 U/L (ref 0–37)
Albumin: 4.5 g/dL (ref 3.5–5.2)
Alkaline Phosphatase: 65 U/L (ref 39–117)
BUN: 10 mg/dL (ref 6–23)
CO2: 31 mEq/L (ref 19–32)
Calcium: 10.1 mg/dL (ref 8.4–10.5)
Chloride: 99 mEq/L (ref 96–112)
Creatinine, Ser: 0.73 mg/dL (ref 0.40–1.20)
GFR: 83.44 mL/min (ref >60.00)
Glucose, Bld: 140 mg/dL — ABNORMAL HIGH (ref 70–99)
Potassium: 4 mEq/L (ref 3.5–5.1)
Sodium: 139 mEq/L (ref 135–145)
Total Bilirubin: 0.3 mg/dL (ref 0.2–1.2)
Total Protein: 7.6 g/dL (ref 6.0–8.3)

## 2021-02-12 LAB — LIPID PANEL
Cholesterol: 200 mg/dL (ref 0–200)
HDL: 75 mg/dL (ref >39.00)
LDL Cholesterol: 92 mg/dL (ref 0–99)
NonHDL: 125.33
Total CHOL/HDL Ratio: 3
Triglycerides: 165 mg/dL — ABNORMAL HIGH (ref 0.0–149.0)
VLDL: 33 mg/dL (ref 0.0–40.0)

## 2021-02-12 LAB — HEMOGLOBIN A1C: Hgb A1c MFr Bld: 6.6 % — ABNORMAL HIGH (ref 4.6–6.5)

## 2021-02-13 NOTE — Progress Notes (Signed)
No critical labs need to be addressed urgently. We will discuss labs in detail at upcoming office visit.   

## 2021-02-15 ENCOUNTER — Other Ambulatory Visit: Payer: Self-pay | Admitting: Family Medicine

## 2021-02-15 NOTE — Telephone Encounter (Signed)
Last office visit 01/23/2021 for Back Pain/urinary frequency.  Last refilled 09/13/2020 for 30 g with no refills.  Next Appt: 03/05/2021 for 6 month f/u.

## 2021-02-18 ENCOUNTER — Telehealth: Payer: Self-pay | Admitting: Pulmonary Disease

## 2021-02-19 ENCOUNTER — Ambulatory Visit: Payer: Medicare Other | Admitting: Family Medicine

## 2021-02-20 ENCOUNTER — Other Ambulatory Visit: Payer: Self-pay | Admitting: Pulmonary Disease

## 2021-02-20 ENCOUNTER — Other Ambulatory Visit: Payer: Self-pay | Admitting: Family Medicine

## 2021-02-20 DIAGNOSIS — J84112 Idiopathic pulmonary fibrosis: Secondary | ICD-10-CM

## 2021-02-20 NOTE — Progress Notes (Signed)
high

## 2021-02-22 NOTE — Telephone Encounter (Signed)
Someone scheduled the patient's CT appt on 03/06/21 '@3'$ :00pm at [facility]

## 2021-02-27 ENCOUNTER — Other Ambulatory Visit: Payer: Self-pay | Admitting: Family Medicine

## 2021-02-27 DIAGNOSIS — G4733 Obstructive sleep apnea (adult) (pediatric): Secondary | ICD-10-CM | POA: Diagnosis not present

## 2021-03-05 ENCOUNTER — Encounter: Payer: Self-pay | Admitting: Family Medicine

## 2021-03-05 ENCOUNTER — Other Ambulatory Visit: Payer: Self-pay

## 2021-03-05 ENCOUNTER — Ambulatory Visit (INDEPENDENT_AMBULATORY_CARE_PROVIDER_SITE_OTHER): Payer: Medicare Other | Admitting: Family Medicine

## 2021-03-05 VITALS — BP 120/70 | HR 74 | Temp 97.8°F | Ht 65.5 in | Wt 233.5 lb

## 2021-03-05 DIAGNOSIS — I152 Hypertension secondary to endocrine disorders: Secondary | ICD-10-CM | POA: Diagnosis not present

## 2021-03-05 DIAGNOSIS — M542 Cervicalgia: Secondary | ICD-10-CM | POA: Diagnosis not present

## 2021-03-05 DIAGNOSIS — J84112 Idiopathic pulmonary fibrosis: Secondary | ICD-10-CM

## 2021-03-05 DIAGNOSIS — E785 Hyperlipidemia, unspecified: Secondary | ICD-10-CM

## 2021-03-05 DIAGNOSIS — E118 Type 2 diabetes mellitus with unspecified complications: Secondary | ICD-10-CM

## 2021-03-05 DIAGNOSIS — E1169 Type 2 diabetes mellitus with other specified complication: Secondary | ICD-10-CM | POA: Diagnosis not present

## 2021-03-05 DIAGNOSIS — E1159 Type 2 diabetes mellitus with other circulatory complications: Secondary | ICD-10-CM

## 2021-03-05 MED ORDER — BACLOFEN 10 MG PO TABS: 10.0000 mg | ORAL_TABLET | Freq: Three times a day (TID) | ORAL | 1 refills | Status: DC | PRN

## 2021-03-05 NOTE — Assessment & Plan Note (Signed)
Ome vertebral pain.. pt wishes to continue baclofen and tylneol.. if not improving she will call for order for X-ray of neck ( no appt needed unless symptoms changed)

## 2021-03-05 NOTE — Assessment & Plan Note (Signed)
Stable, chronic.  Continue current medication.   pravastatin

## 2021-03-05 NOTE — Patient Instructions (Addendum)
Heat, massage.  Use tylenol for pain twice daily and use baclofen as needed.  Make follow up appt for neck pain if not improving.. will consider X-ray of neck at that point.

## 2021-03-05 NOTE — Assessment & Plan Note (Signed)
Stable, chronic.  Continue current medication.   Good control on  losartan, HCTZ, verapamil

## 2021-03-05 NOTE — Assessment & Plan Note (Signed)
Stable, chronic.  Continue current medication.    At goal on Farxiga, metformin and actos

## 2021-03-05 NOTE — Progress Notes (Signed)
Patient ID: Kaitlin Hamilton, female    DOB: 10/14/1950, 70 y.o.   MRN: [id number]  This visit was conducted in person.  BP 120/70   Pulse 74   Temp 97.8 F (36.6 C) (Temporal)   Ht 5' 5.5" (1.664 m)   Wt 233 lb 8 oz (105.9 kg)   SpO2 99% Comment: 4 L O2  BMI 38.27 kg/m    CC: Chief Complaint  Patient presents with   Diabetes   Neck Pain    Subjective:   HPI: Kaitlin Hamilton is a 70 y.o. female presenting on 03/05/2021 for Diabetes and Neck Pain  Diabetes:  At goal on Farxiga, metformin and actos Lab Results  Component Value Date   HGBA1C 6.6 (H) 02/12/2021  Using medications without difficulties: none Hypoglycemic episodes: rare Hyperglycemic episodes: none Feet problems: no ulcers Blood Sugars averaging: 98-140 eye exam within last year: yes  High cholesterol LDL at goal on pravastatin Lab Results  Component Value Date   CHOL 200 02/12/2021   HDL 75.00 02/12/2021   LDLCALC 92 02/12/2021   LDLDIRECT 104.0 09/04/2017   TRIG 165.0 (H) 02/12/2021   CHOLHDL 3 02/12/2021        Hypertension:   Good control on  losartan, HCTZ, verapamil  BP Readings from Last 3 Encounters:  03/05/21 120/70  01/23/21 140/60  01/17/21 (!) 159/66  Using medication without problems or lightheadedness: none Chest pain with exertion: none Edema:none Short of breath: fairly stable.. followed by pulomonary Average home BPs: Other issues:  Neck pain, chronic.Marland Kitchen not worsening.  Baclofen 2 times daily helps with neck pain. Using tylenol for pain.  No numbness or weakness in arms.  Improved temporarily with prednisone but has sleeping issues with it. Low back pain is better.  Relevant past medical, surgical, family and social history reviewed and updated as indicated. Interim medical history since our last visit reviewed. Allergies and medications reviewed and updated. Outpatient Medications Prior to Visit  Medication Sig Dispense Refill   acetaminophen (TYLENOL) 500 MG tablet Take  1,000 mg by mouth every 6 (six) hours as needed for moderate pain.     Ascorbic Acid (VITAMIN C PO) Take by mouth.     baclofen (LIORESAL) 10 MG tablet Take 10 mg by mouth 3 (three) times daily as needed.     dapagliflozin propanediol (FARXIGA) 10 MG TABS tablet Take 1 tablet (10 mg total) by mouth daily. 90 tablet 3   ESBRIET 801 MG TABS TAKE 1 TABLET ('801MG'$ ) BY  MOUTH THREE TIMES DAILY  WITH FOOD 90 tablet 3   FIBER PO Take 1 tablet by mouth daily.     fluticasone (FLONASE) 50 MCG/ACT nasal spray Place 1 spray into both nostrils daily.     hydrochlorothiazide (HYDRODIURIL) 12.5 MG tablet TAKE 1 TABLET DAILY ALONG WITH ONE OF THE LOSARTAN 50 MG TABLETS 90 tablet 1   levothyroxine (SYNTHROID) 125 MCG tablet TAKE 1 TABLET BY MOUTH  DAILY BEFORE BREAKFAST 90 tablet 3   losartan (COZAAR) 50 MG tablet TAKE 1 TABLET DAILY ALONG WITH 1 OF THE HCTZ 12.'5MG'$  TABLETS 90 tablet 1   metFORMIN (GLUCOPHAGE-XR) 500 MG 24 hr tablet Take 4 tablets (2,000 mg total) by mouth daily with breakfast. 360 tablet 3   montelukast (SINGULAIR) 10 MG tablet Take 1 tablet (10 mg total) by mouth at bedtime. 90 tablet 3   mycophenolate (CELLCEPT) 500 MG tablet TAKE 2 TABLETS BY MOUTH  TWICE DAILY 180 tablet 7   NON FORMULARY  Place 4 L into the nose daily. 10 Liters with exertion     nystatin cream (MYCOSTATIN) Apply 1 application topically 3 (three) times daily. 30 g 0   nystatin ointment (MYCOSTATIN) SMARTSIG:1 Topical Daily PRN     OneTouch Delica Lancets 99991111 MISC CHECK BLOOD SUGAR ONCE DAILY. 100 each 3   ONETOUCH ULTRA test strip CHECK BLOOD SUGAR DAILY. 50 strip 5   pantoprazole (PROTONIX) 40 MG tablet TAKE 1 TABLET BY MOUTH  TWICE DAILY 180 tablet 3   pioglitazone (ACTOS) 45 MG tablet Take 1 tablet (45 mg total) by mouth daily. 90 tablet 3   pravastatin (PRAVACHOL) 40 MG tablet TAKE 1 TABLET DAILY. 90 tablet 1   Probiotic Product (PROBIOTIC DAILY PO) Take by mouth.     verapamil (CALAN-SR) 180 MG CR tablet TAKE 1 TABLET  BY MOUTH  DAILY 90 tablet 3   cephALEXin (KEFLEX) 500 MG capsule Take 1 capsule (500 mg total) by mouth 3 (three) times daily. 21 capsule 0   diclofenac (VOLTAREN) 75 MG EC tablet Take 1 tablet (75 mg total) by mouth 2 (two) times daily. 30 tablet 0   tamsulosin (FLOMAX) 0.4 MG CAPS capsule Take 1 capsule (0.4 mg total) by mouth daily. For urine flow. 30 capsule 0   No facility-administered medications prior to visit.     Per HPI unless specifically indicated in ROS section below Review of Systems  Constitutional:  Negative for fatigue and fever.  HENT:  Negative for ear pain.   Eyes:  Negative for pain.  Respiratory:  Negative for chest tightness and shortness of breath.   Cardiovascular:  Negative for chest pain, palpitations and leg swelling.  Gastrointestinal:  Negative for abdominal pain.  Genitourinary:  Negative for dysuria.  Objective:  BP 120/70   Pulse 74   Temp 97.8 F (36.6 C) (Temporal)   Ht 5' 5.5" (1.664 m)   Wt 233 lb 8 oz (105.9 kg)   SpO2 99% Comment: 4 L O2  BMI 38.27 kg/m   Wt Readings from Last 3 Encounters:  03/05/21 233 lb 8 oz (105.9 kg)  01/23/21 239 lb (108.4 kg)  01/17/21 230 lb 9.6 oz (104.6 kg)      Physical Exam Constitutional:      General: She is not in acute distress.    Appearance: Normal appearance. She is well-developed. She is obese. She is not ill-appearing or toxic-appearing.  HENT:     Head: Normocephalic.     Right Ear: Hearing, tympanic membrane, ear canal and external ear normal. Tympanic membrane is not erythematous, retracted or bulging.     Left Ear: Hearing, tympanic membrane, ear canal and external ear normal. Tympanic membrane is not erythematous, retracted or bulging.     Nose: Nose normal. No mucosal edema or rhinorrhea.     Right Sinus: No maxillary sinus tenderness or frontal sinus tenderness.     Left Sinus: No maxillary sinus tenderness or frontal sinus tenderness.     Mouth/Throat:     Pharynx: Uvula midline.  Eyes:      General: Lids are normal. Lids are everted, no foreign bodies appreciated.     Conjunctiva/sclera: Conjunctivae normal.     Pupils: Pupils are equal, round, and reactive to light.  Neck:     Thyroid: No thyroid mass or thyromegaly.     Vascular: No carotid bruit.     Trachea: Trachea normal.  Cardiovascular:     Rate and Rhythm: Normal rate and regular rhythm.  Pulses: Normal pulses.     Heart sounds: Normal heart sounds, S1 normal and S2 normal. No murmur heard.   No friction rub. No gallop.  Pulmonary:     Effort: Pulmonary effort is normal. No tachypnea or respiratory distress.     Breath sounds: Normal breath sounds. No decreased breath sounds, wheezing, rhonchi or rales.  Abdominal:     General: Bowel sounds are normal. There is no distension or abdominal bruit.     Palpations: Abdomen is soft. There is no fluid wave or mass.     Tenderness: There is no abdominal tenderness. There is no guarding or rebound.     Hernia: No hernia is present.  Musculoskeletal:     Cervical back: Neck supple. Spasms, tenderness and bony tenderness present. No swelling, edema, deformity, erythema, rigidity or torticollis. Pain with movement present. Decreased range of motion.  Lymphadenopathy:     Cervical: No cervical adenopathy.  Skin:    General: Skin is warm and dry.     Findings: No rash.  Neurological:     Mental Status: She is alert and oriented to person, place, and time.     Cranial Nerves: Cranial nerves are intact. No cranial nerve deficit.     Sensory: Sensation is intact. No sensory deficit.     Motor: Motor function is intact.     Coordination: Coordination is intact.  Psychiatric:        Mood and Affect: Mood is not anxious or depressed.        Speech: Speech normal.        Behavior: Behavior normal. Behavior is cooperative.        Thought Content: Thought content normal.        Judgment: Judgment normal.      Results for orders placed or performed in visit on 02/12/21   Comprehensive metabolic panel  Result Value Ref Range   Sodium 139 135 - 145 mEq/L   Potassium 4.0 3.5 - 5.1 mEq/L   Chloride 99 96 - 112 mEq/L   CO2 31 19 - 32 mEq/L   Glucose, Bld 140 (H) 70 - 99 mg/dL   BUN 10 6 - 23 mg/dL   Creatinine, Ser 0.73 0.40 - 1.20 mg/dL   Total Bilirubin 0.3 0.2 - 1.2 mg/dL   Alkaline Phosphatase 65 39 - 117 U/L   AST 12 0 - 37 U/L   ALT 10 0 - 35 U/L   Total Protein 7.6 6.0 - 8.3 g/dL   Albumin 4.5 3.5 - 5.2 g/dL   GFR 83.44 >60.00 mL/min   Calcium 10.1 8.4 - 10.5 mg/dL  Lipid panel  Result Value Ref Range   Cholesterol 200 0 - 200 mg/dL   Triglycerides 165.0 (H) 0.0 - 149.0 mg/dL   HDL 75.00 >39.00 mg/dL   VLDL 33.0 0.0 - 40.0 mg/dL   LDL Cholesterol 92 0 - 99 mg/dL   Total CHOL/HDL Ratio 3    NonHDL 125.33   Hemoglobin A1c  Result Value Ref Range   Hgb A1c MFr Bld 6.6 (H) 4.6 - 6.5 %    This visit occurred during the SARS-CoV-2 public health emergency.  Safety protocols were in place, including screening questions prior to the visit, additional usage of staff PPE, and extensive cleaning of exam room while observing appropriate contact time as indicated for disinfecting solutions.   COVID 19 screen:  No recent travel or known exposure to COVID19 The patient denies respiratory symptoms of COVID 19 at this  time. The importance of social distancing was discussed today.   Assessment and Plan    Problem List Items Addressed This Visit     Controlled diabetes mellitus type 2 with complications ([place]) - Primary (Chronic)    Stable, chronic.  Continue current medication.    At goal on Farxiga, metformin and actos      Hyperlipidemia associated with type 2 diabetes mellitus ([facility]) (Chronic)    Stable, chronic.  Continue current medication.   pravastatin      Hypertension associated with diabetes ([facility]) (Chronic)    Stable, chronic.  Continue current medication.   Good control on  losartan, HCTZ, verapamil       UIP (usual  interstitial pneumonitis) ([facility]) (Chronic)    Has upcoming CT and  Pulmonary follow up.      Neck pain on left side    Ome vertebral pain.. pt wishes to continue baclofen and tylneol.. if not improving she will call for order for X-ray of neck ( no appt needed unless symptoms changed)          [name], MD

## 2021-03-05 NOTE — Assessment & Plan Note (Signed)
Has upcoming CT and  Pulmonary follow up.

## 2021-03-06 ENCOUNTER — Ambulatory Visit
Admission: RE | Admit: 2021-03-06 | Discharge: 2021-03-06 | Disposition: A | Discharge status: Home / Self Care | Payer: Medicare Other | Source: Ambulatory Visit | Attending: Pulmonary Disease | Admitting: Pulmonary Disease

## 2021-03-06 ENCOUNTER — Other Ambulatory Visit: Payer: Self-pay

## 2021-03-06 DIAGNOSIS — I700 Atherosclerosis of aorta: Secondary | ICD-10-CM | POA: Diagnosis not present

## 2021-03-06 DIAGNOSIS — J84112 Idiopathic pulmonary fibrosis: Secondary | ICD-10-CM | POA: Insufficient documentation

## 2021-03-06 DIAGNOSIS — J479 Bronchiectasis, uncomplicated: Secondary | ICD-10-CM | POA: Diagnosis not present

## 2021-03-07 ENCOUNTER — Other Ambulatory Visit: Payer: Self-pay | Admitting: Family Medicine

## 2021-03-07 DIAGNOSIS — J3089 Other allergic rhinitis: Secondary | ICD-10-CM

## 2021-03-17 ENCOUNTER — Other Ambulatory Visit: Payer: Self-pay | Admitting: Pulmonary Disease

## 2021-03-17 DIAGNOSIS — J849 Interstitial pulmonary disease, unspecified: Secondary | ICD-10-CM

## 2021-03-18 NOTE — Telephone Encounter (Signed)
Last appt on: 09/28/20 with Dr. Devota Pace dose is '801mg'$  three times daily  Next appt on 04/15/21 with Dr. Vaughan Browner  HRCT on 03/06/21: Findings are consistent with UIP per consensus guidelines. Given presence of air trapping, fibrotic hypersensitivity pneumonitis is an additional consideration. No evidence of progression when compared with prior CT dated August 04, 2019.  LFTs wnl on 02/12/21. Next due on 05/15/21 CMP Latest Ref Rng & Units 02/12/2021 01/09/2021 07/26/2020  Glucose 70 - 99 mg/dL 140(H) 161(H) 129(H)  BUN 6 - 23 mg/dL '10 16 14  '$ Creatinine 0.40 - 1.20 mg/dL 0.73 0.74 0.72  Sodium 135 - 145 mEq/L 139 138 137  Potassium 3.5 - 5.1 mEq/L 4.0 4.1 4.3  Chloride 96 - 112 mEq/L 99 96 95(L)  CO2 19 - 32 mEq/L 31 32 32  Calcium 8.4 - 10.5 mg/dL 10.1 10.3 10.4  Total Protein 6.0 - 8.3 g/dL 7.6 - 7.6  Total Bilirubin 0.2 - 1.2 mg/dL 0.3 - 0.5  Alkaline Phos 39 - 117 U/L 65 - 68  AST 0 - 37 U/L 12 - 11  ALT 0 - 35 U/L 10 - 8   Dean Wonder Wilhemina Bonito, PharmD, MPH, BCPS Clinical Pharmacist (Rheumatology and Pulmonology)

## 2021-03-28 ENCOUNTER — Other Ambulatory Visit: Payer: Medicare Other

## 2021-04-01 ENCOUNTER — Other Ambulatory Visit: Payer: Self-pay | Admitting: Family Medicine

## 2021-04-01 NOTE — Telephone Encounter (Signed)
Last office visit 03/05/2021 for DM/Neck Pain. Last refilled 02/15/2021 for 30 g with no refills.  Next Appt: 09/05/2021 for CPE.

## 2021-04-05 ENCOUNTER — Other Ambulatory Visit: Payer: Self-pay | Admitting: Family Medicine

## 2021-04-08 ENCOUNTER — Telehealth: Payer: Self-pay

## 2021-04-08 ENCOUNTER — Other Ambulatory Visit: Payer: Self-pay | Admitting: Family Medicine

## 2021-04-08 NOTE — Telephone Encounter (Signed)
Last office visit 03/05/2021 for DM/Neck Pain.  Last refilled 03/05/2021 for #30 with 1 refill.  CPE scheduled for 09/05/2021.

## 2021-04-08 NOTE — Progress Notes (Addendum)
Chronic Hamilton Management Pharmacy Assistant   Name: Kaitlin Hamilton  MRN: [id number] DOB: 15-Nov-1950  Reason for Encounter: Hypertension Disease State   Recent office visits:  03/05/2021 - Kaitlin Lofts, MD - Patient presented for diabetes. Stopped (as all courses have been completed): cephALEXin (KEFLEX) 500 MG capsule, diclofenac (VOLTAREN) 75 MG EC tablet, tamsulosin (FLOMAX) 0.4 MG CAPS capsule. 01/23/2021 - Kaitlin Lofts, MD - Patient presented for back pain. Started: diclofenac (VOLTAREN) 75 MG EC tablet.  01/08/2021 - Kaitlin Friendly, NP - Patient presented for a CT Renal Stone Study with labs only (BMP and CBC).  01/04/2021 - Kaitlin Friendly, NP - Patient presented for flank pain. Started: Tamsulosin (Flomax) 0.4 mg once daily for urine flow and to facilitate kidney stone removal.  Recent consult visits:  03/06/2021 - Kaitlin Garfinkel, MD - Patient presented for CT Chest.  01/17/2021 - Kaitlin Hamilton at [facility] - Patient presented with back pain. Dexamethasone injection 10mg  given. Stop: Tizanidine 4mg  tablet. Start: Baclofen 10mg  tablet, Oxycodone 5mg  immediate release tablet and Prednisone 10mg  tablet.   Hospital visits:  None since last CCM contact  Outpatient Encounter Medications as of 04/08/2021  Medication Sig   acetaminophen (TYLENOL) 500 MG tablet Take 1,000 mg by mouth every 6 (six) hours as needed for moderate pain.   Ascorbic Acid (VITAMIN C PO) Take by mouth.   baclofen (LIORESAL) 10 MG tablet Take 1 tablet (10 mg total) by mouth 3 (three) times daily as needed.   dapagliflozin propanediol (FARXIGA) 10 MG TABS tablet Take 1 tablet (10 mg total) by mouth daily.   FIBER PO Take 1 tablet by mouth daily.   fluticasone (FLONASE) 50 MCG/ACT nasal spray Place 1 spray into both nostrils daily.   hydrochlorothiazide (HYDRODIURIL) 12.5 MG tablet TAKE 1 TABLET DAILY ALONG WITH ONE OF THE LOSARTAN 50 MG TABLETS   levothyroxine (SYNTHROID) 125 MCG tablet TAKE 1 TABLET BY MOUTH   DAILY BEFORE BREAKFAST   losartan (COZAAR) 50 MG tablet TAKE 1 TABLET DAILY ALONG WITH 1 OF THE HCTZ 12.5MG  TABLETS   metFORMIN (GLUCOPHAGE-XR) 500 MG 24 hr tablet Take 4 tablets (2,000 mg total) by mouth daily with breakfast.   montelukast (SINGULAIR) 10 MG tablet Take 1 tablet (10 mg total) by mouth at bedtime.   mycophenolate (CELLCEPT) 500 MG tablet TAKE 2 TABLETS BY MOUTH  TWICE DAILY   NON FORMULARY Place 4 L into the nose daily. 10 Liters with exertion   nystatin cream (MYCOSTATIN) Apply 1 application topically 3 (three) times daily.   nystatin ointment (MYCOSTATIN) SMARTSIG:1 Topical Daily PRN   OneTouch Delica Lancets 31D MISC CHECK BLOOD SUGAR ONCE DAILY.   ONETOUCH ULTRA test strip CHECK BLOOD SUGAR DAILY.   pantoprazole (PROTONIX) 40 MG tablet TAKE 1 TABLET BY MOUTH  TWICE DAILY   pioglitazone (ACTOS) 45 MG tablet Take 1 tablet (45 mg total) by mouth daily.   Pirfenidone 801 MG TABS Take 801 mg by mouth 3 (three) times daily with meals.   pravastatin (PRAVACHOL) 40 MG tablet TAKE 1 TABLET DAILY.   Probiotic Product (PROBIOTIC DAILY PO) Take by mouth.   verapamil (CALAN-SR) 180 MG CR tablet TAKE 1 TABLET BY MOUTH  DAILY   No facility-administered encounter medications on file as of 04/08/2021.    Recent Office Vitals: BP Readings from Last 3 Encounters:  03/05/21 120/70  01/23/21 140/60  01/17/21 (!) 159/66   Pulse Readings from Last 3 Encounters:  03/05/21 74  01/23/21 78  01/17/21 94  Wt Readings from Last 3 Encounters:  03/05/21 233 lb 8 oz (105.9 kg)  01/23/21 239 lb (108.4 kg)  01/17/21 230 lb 9.6 oz (104.6 kg)     Kidney Function Lab Results  Component Value Date/Time   CREATININE 0.73 02/12/2021 10:03 AM   CREATININE 0.74 01/09/2021 10:59 AM   CREATININE 1.07 07/26/2014 12:29 PM   GFR 83.44 02/12/2021 10:03 AM   GFRNONAA >60 07/17/2019 02:05 PM   GFRNONAA 55 (L) 07/26/2014 12:29 PM   GFRAA >60 07/17/2019 02:05 PM   GFRAA >60 07/26/2014 12:29 PM     BMP Latest Ref Rng & Units 02/12/2021 01/09/2021 07/26/2020  Glucose 70 - 99 mg/dL 140(H) 161(H) 129(H)  BUN 6 - 23 mg/dL 10 16 14   Creatinine 0.40 - 1.20 mg/dL 0.73 0.74 0.72  Sodium 135 - 145 mEq/L 139 138 137  Potassium 3.5 - 5.1 mEq/L 4.0 4.1 4.3  Chloride 96 - 112 mEq/L 99 96 95(L)  CO2 19 - 32 mEq/L 31 32 32  Calcium 8.4 - 10.5 mg/dL 10.1 10.3 10.4   Contacted patient on 04/08/2021 to discuss hypertension disease state  Current antihypertensive regimen:  Losartan 50mg  Tablet Hydrochlorothiazide 12.5mg  Tablet Verapamil 180 mg CR Tablet  Patient verbally confirms she is taking the above medications as directed. Yes  How often are you checking your Blood Pressure? Patient states she does not monitor her blood pressure at home.   Wrist or arm cuff: None Caffeine intake: None Salt intake: Does not add it to food OTC medications including pseudoephedrine or NSAIDs? None  What recent interventions/DTPs have been made by any provider to improve Blood Pressure control since last CPP Visit: Continue current medications  Any recent hospitalizations or ED visits since last visit with CPP? No  What diet changes have been made to improve Blood Pressure Control?  Patient states there are no diet changes.   What exercise is being done to improve your Blood Pressure Control?  Patient states she is on oxygen so she does not exercise.   Adherence Review: Is the patient currently on ACE/ARB medication? Yes Does the patient have >5 day gap between last estimated fill dates? No  Star Rating Drugs:  Medication:  Last Fill: Day Supply Pravastatin 40mg  02/11/2021 90  Losartan 50mg  03/21/2021 30  Metformin 500mg   03/21/2021 90  Hamilton Gaps: Annual wellness visit in last year? Yes 08/21/2020 Most Recent BP reading: 120/70 on 03/05/2021  If Diabetic: Most recent A1C reading: 6.7 on 07/26/2020 Last eye exam / retinopathy screening: 04/17/2020 Last diabetic foot exam:  08/21/2020  Pulmonology appointment with on 04/15/2021  Debbora Dus, CPP notified  Marijean Niemann, [place] Assistant [phone]  I have reviewed the Hamilton management and Hamilton coordination activities outlined in this encounter and I am certifying that I agree with the content of this note. No further action required.  Debbora Dus, PharmD Clinical Pharmacist [facility] [phone]

## 2021-04-10 ENCOUNTER — Ambulatory Visit (HOSPITAL_COMMUNITY): Payer: Medicare Other

## 2021-04-15 ENCOUNTER — Ambulatory Visit (INDEPENDENT_AMBULATORY_CARE_PROVIDER_SITE_OTHER): Payer: Medicare Other | Admitting: Pulmonary Disease

## 2021-04-15 ENCOUNTER — Other Ambulatory Visit: Payer: Self-pay

## 2021-04-15 VITALS — BP 120/58 | HR 85 | Ht 66.0 in | Wt 227.0 lb

## 2021-04-15 DIAGNOSIS — J849 Interstitial pulmonary disease, unspecified: Secondary | ICD-10-CM | POA: Diagnosis not present

## 2021-04-15 DIAGNOSIS — Z5181 Encounter for therapeutic drug level monitoring: Secondary | ICD-10-CM | POA: Diagnosis not present

## 2021-04-15 DIAGNOSIS — Z23 Encounter for immunization: Secondary | ICD-10-CM

## 2021-04-15 DIAGNOSIS — J84112 Idiopathic pulmonary fibrosis: Secondary | ICD-10-CM

## 2021-04-15 DIAGNOSIS — G4733 Obstructive sleep apnea (adult) (pediatric): Secondary | ICD-10-CM | POA: Diagnosis not present

## 2021-04-15 NOTE — Progress Notes (Signed)
Kaitlin Hamilton    [id number]    [birth year]  Primary Care Physician:Bedsole, Mervyn Gay, MD  Referring Physician: [name], MD [place],  [place] [id number]  Chief complaint: Follow-up for interstitial lung disease  HPI: 70 year old with history of biopsy-proven UIP pulmonary fibrosis.  Previously followed by Dr. [name] lung biopsy in December 2014 with findings showing UIP fibrosis.  There is strong suspicion for underlying connective tissue disease as she responds to prednisone. She has also been evaluated by Dr. [name] [name] at [facility]. Esbriet started in 2018 for progressive pulmonary fibrosis.   She has had 2 episodes of diverticulitis in 2019 and 2020, E. coli UTI in 2020 and Campylobacter colitis in December 2020. We reduced her CellCept to 500 mg twice daily due to recurrent episodes of UTI however her breathing got worse and she is back now at 1 g twice daily with stabilization of symptoms   Interim history: She got a dose of evusheld for COVID prophylaxis in April 2022  Continues on Esbriet and CellCept Continues to have dyspnea on exertion.  Does note some desats to low 80s on exertion Here for review of CT scan  Outpatient Encounter Medications as of 04/15/2021  Medication Sig   acetaminophen (TYLENOL) 500 MG tablet Take 1,000 mg by mouth every 6 (six) hours as needed for moderate pain.   Ascorbic Acid (VITAMIN C PO) Take by mouth.   baclofen (LIORESAL) 10 MG tablet Take 1 tablet (10 mg total) by mouth 3 (three) times daily as needed.   dapagliflozin propanediol (FARXIGA) 10 MG TABS tablet Take 1 tablet (10 mg total) by mouth daily.   FIBER PO Take 1 tablet by mouth daily.   fluticasone (FLONASE) 50 MCG/ACT nasal spray Place 1 spray into both nostrils daily.   hydrochlorothiazide (HYDRODIURIL) 12.5 MG tablet TAKE 1 TABLET DAILY ALONG WITH ONE OF THE LOSARTAN 50 MG TABLETS   levothyroxine (SYNTHROID) 125 MCG tablet TAKE 1 TABLET BY  MOUTH  DAILY BEFORE BREAKFAST   losartan (COZAAR) 50 MG tablet TAKE 1 TABLET DAILY ALONG WITH 1 OF THE HCTZ 12.5MG TABLETS   metFORMIN (GLUCOPHAGE-XR) 500 MG 24 hr tablet Take 4 tablets (2,000 mg total) by mouth daily with breakfast.   montelukast (SINGULAIR) 10 MG tablet Take 1 tablet (10 mg total) by mouth at bedtime.   mycophenolate (CELLCEPT) 500 MG tablet TAKE 2 TABLETS BY MOUTH  TWICE DAILY   NON FORMULARY Place 4 L into the nose daily. 10 Liters with exertion   nystatin cream (MYCOSTATIN) Apply 1 application topically 3 (three) times daily.   nystatin ointment (MYCOSTATIN) SMARTSIG:1 Topical Daily PRN   OneTouch Delica Lancets 52D MISC CHECK BLOOD SUGAR ONCE DAILY.   ONETOUCH ULTRA test strip CHECK BLOOD SUGAR DAILY.   pantoprazole (PROTONIX) 40 MG tablet TAKE 1 TABLET BY MOUTH  TWICE DAILY   pioglitazone (ACTOS) 45 MG tablet Take 1 tablet (45 mg total) by mouth daily.   Pirfenidone 801 MG TABS Take 801 mg by mouth 3 (three) times daily with meals.   pravastatin (PRAVACHOL) 40 MG tablet TAKE 1 TABLET DAILY.   Probiotic Product (PROBIOTIC DAILY PO) Take by mouth.   verapamil (CALAN-SR) 180 MG CR tablet TAKE 1 TABLET BY MOUTH  DAILY   No facility-administered encounter medications on file as of 04/15/2021.   Physical Exam: Blood pressure (!) 120/58, pulse 85, height '5\' 6"'  (1.676 m), weight 227 lb (103 kg), SpO2  100 %. Gen:      No acute distress HEENT:  EOMI, sclera anicteric Neck:     No masses; no thyromegaly Lungs:    Bibasal crackles CV:         Regular rate and rhythm; no murmurs Abd:      + bowel sounds; soft, non-tender; no palpable masses, no distension Ext:    No edema; adequate peripheral perfusion Skin:      Warm and dry; no rash Neuro: alert and oriented x 3 Psych: normal mood and affect   Data Reviewed: Imaging: 11/2012 CT chest >>  centrilobular nodules, interlobular septal thickening worse in bases and periphery R lung > L; some GGO in bases and periphery as well,  some bronchiectasis in the bases R > L; findings suggestive of fibrosis but not UIP; question hypersensitivity pneumonitis given centrilobular nodules; also question aspiration   04/2013 Barium swallow> abnormal esophageal motility, GERD, hiatal hernia   04/2013 CT chest ([name])> findings suggestive of but not diagnostic of NSIP, small pulmonary nodule  February 2016 CT chest high resolution> slight progression in reticular abnormalities consistent with pulmonary fibrosis, uip  High-resolution CT chest 08/04/2019-basilar predominant fibrotic interstitial lung disease with mild honeycombing.  Mild progressions 2016.  UIP pattern  High-resolution CT 03/09/2021-stable UIP pattern pulmonary fibrosis I have reviewed the images personally.   PFTs: 04/28/2018 FVC 1.1 [35%], FEV1 1.02 [42%], F/F 92, DLCO 8.88 [36%] Severe restriction, diffusion impairment. With worsening compared to prior years.  10/12/2019 FVC 1.13 [36%], FEV1 1.05 [44%], F/F 93, DLCO 8.76 [44%] Severe restriction, diffusion impairment.  Stable compared to 2019  09/28/2020 FVC 1.03 [33%], FEV1 0.97 [41%], DLCO 7.94 [40%] Severe restriction, diffusion impairment  6 minute walk 10/19/2012 walked 500 feet in office on room air oxygenation did not drop below 90% 04/2013 6MW RA > 1100 feet, HR peak 109, O2 sat Nadir 87% 11/2013 6MW 1364 feet, O2 saturation nadir 78% 08/21/14 6MW 1400 feet 90% on 8L  Labs: 03/2013 ANA, ANCA, Anti-Jo-1, ESR, RF, SCL-70, anti-centromere, SSA/SSB, all negative; CRP 0.6;   07/17/2019-CBC within normal limits 07/26/2020-hepatic panel within normal limits  Assessment:  UIP fibrosis Presumed autoimmune component given response to immunosuppressive's in the past Currently maintained on CellCept 1 g twice daily, pirfenidone. Check CBC.  Hepatic panel in July 2022 was within normal limits Not on PJP prophylaxis due to allergies to sulfa drugs and atovaquone  S/p Evusheild infusion to protect against  Covid infection  PFTs reviewed with slightly worsened diffusion capacity though CT from last month is stable  OSA Continue CPAP Download reviewed with good compliance.  Plan/Recommendations: - Continue pirfenidone, CellCept 1 g twice daily - Continue supplemental oxygen  Follow-up in 6 months.  [name] MD [facility] 04/15/2021, 11:18 AM  CC: [name], MD

## 2021-04-15 NOTE — Patient Instructions (Addendum)
I have reviewed your CT scan which shows stable lung scarring.  This is good news Your labs at primary care from 2 months ago look good Continue CellCept and Ofev Its okay to increase supplemental oxygen to 6 to 8 L on exertion Please get the new COVID booster vaccine  Follow-up in 6 months

## 2021-04-16 ENCOUNTER — Encounter: Payer: Self-pay | Admitting: Pulmonary Disease

## 2021-04-19 ENCOUNTER — Telehealth: Payer: Self-pay | Admitting: Pulmonary Disease

## 2021-04-19 DIAGNOSIS — J9611 Chronic respiratory failure with hypoxia: Secondary | ICD-10-CM

## 2021-04-19 NOTE — Telephone Encounter (Signed)
I called and spoke with pts daughter.   She stated that when the pt came in on 09/26 papers were given to PM to fill out so that the pt can get new equipment.  Her daughter stated that she has had the equipment for over 5 years and they would like to have new equipment.  PM please advise. Thanks

## 2021-04-25 NOTE — Telephone Encounter (Signed)
Looked for the papers down in [employer], could not find anything for this patient. Will call back after 1230pm.

## 2021-04-25 NOTE — Telephone Encounter (Signed)
Called and spoke with Kaitlin Hamilton. I advised her that the order had been placed on 09/30 and per the chart, [employer] did receive the order yesterday at 458pm. She verbalized understanding.   Nothing further needed at time of call.

## 2021-05-01 ENCOUNTER — Other Ambulatory Visit: Payer: Self-pay | Admitting: Family Medicine

## 2021-05-01 DIAGNOSIS — Z1231 Encounter for screening mammogram for malignant neoplasm of breast: Secondary | ICD-10-CM

## 2021-05-06 ENCOUNTER — Telehealth: Payer: Self-pay | Admitting: Pulmonary Disease

## 2021-05-06 NOTE — Telephone Encounter (Signed)
Message was sent to [employer] to contact the patient

## 2021-05-06 NOTE — Telephone Encounter (Signed)
Called and spoke with [place] at [facility]. She reviewed the patient's account and stated that they did not have the order. I verified the fax number.   PCCs, is there anyway this order can be faxed to [employer] again? Thanks!

## 2021-05-06 NOTE — Telephone Encounter (Signed)
Message sent back from [employer]  Manfred Arch, Sandrea Hammond; Elmo Putt Patient and sent email to [facility] to see what needs to be done. Thank you!

## 2021-05-20 ENCOUNTER — Ambulatory Visit
Admission: RE | Admit: 2021-05-20 | Discharge: 2021-05-20 | Disposition: A | Discharge status: Home / Self Care | Payer: Medicare Other | Source: Ambulatory Visit | Attending: Family Medicine | Admitting: Family Medicine

## 2021-05-20 ENCOUNTER — Other Ambulatory Visit: Payer: Self-pay

## 2021-05-20 DIAGNOSIS — Z1231 Encounter for screening mammogram for malignant neoplasm of breast: Secondary | ICD-10-CM | POA: Insufficient documentation

## 2021-05-22 ENCOUNTER — Other Ambulatory Visit: Payer: Self-pay | Admitting: Family Medicine

## 2021-05-22 DIAGNOSIS — N6310 Unspecified lump in the right breast, unspecified quadrant: Secondary | ICD-10-CM

## 2021-05-22 DIAGNOSIS — R928 Other abnormal and inconclusive findings on diagnostic imaging of breast: Secondary | ICD-10-CM

## 2021-05-22 NOTE — Progress Notes (Signed)
Correction to previous note.  Mass noted on right.. needs further imaging.  Needs further imaging. Pt to be notified per report.

## 2021-05-30 ENCOUNTER — Other Ambulatory Visit: Payer: Self-pay | Admitting: Family Medicine

## 2021-05-30 NOTE — Telephone Encounter (Signed)
Last office visit 03/05/2021 for DM & Neck Pain.  Last refilled 04/09/2021 for #30 with no refills.  CPE scheduled for 09/05/2021.

## 2021-06-04 ENCOUNTER — Other Ambulatory Visit: Payer: Self-pay

## 2021-06-04 ENCOUNTER — Ambulatory Visit
Admission: RE | Admit: 2021-06-04 | Discharge: 2021-06-04 | Disposition: A | Discharge status: Home / Self Care | Payer: Medicare Other | Source: Ambulatory Visit | Attending: Family Medicine | Admitting: Family Medicine

## 2021-06-04 DIAGNOSIS — N6312 Unspecified lump in the right breast, upper inner quadrant: Secondary | ICD-10-CM | POA: Diagnosis not present

## 2021-06-04 DIAGNOSIS — N6310 Unspecified lump in the right breast, unspecified quadrant: Secondary | ICD-10-CM | POA: Insufficient documentation

## 2021-06-04 DIAGNOSIS — N6311 Unspecified lump in the right breast, upper outer quadrant: Secondary | ICD-10-CM | POA: Diagnosis not present

## 2021-06-04 DIAGNOSIS — R928 Other abnormal and inconclusive findings on diagnostic imaging of breast: Secondary | ICD-10-CM | POA: Diagnosis not present

## 2021-06-06 ENCOUNTER — Other Ambulatory Visit: Payer: Self-pay | Admitting: Family Medicine

## 2021-06-17 ENCOUNTER — Other Ambulatory Visit: Payer: Self-pay | Admitting: Family Medicine

## 2021-06-19 ENCOUNTER — Other Ambulatory Visit: Payer: Self-pay | Admitting: Family Medicine

## 2021-06-19 NOTE — Telephone Encounter (Signed)
Last office visit 03/05/2021 for DM/Neck Pain. Last refilled 05/30/21 for #30 with no refills.  CPE scheduled 09/05/2021.

## 2021-06-26 ENCOUNTER — Telehealth: Payer: Self-pay | Admitting: Pulmonary Disease

## 2021-06-27 ENCOUNTER — Other Ambulatory Visit: Payer: Self-pay | Admitting: Family Medicine

## 2021-06-27 NOTE — Telephone Encounter (Signed)
Last office visit 03/05/2021 for DM & Neck Pain.  Last refilled 06/20/2021 for #30 with no refills.  CPE scheduled for 09/05/2021.

## 2021-06-27 NOTE — Telephone Encounter (Signed)
Kaitlin Hamilton, please advise if you have received CMN on pt.

## 2021-06-27 NOTE — Telephone Encounter (Signed)
I do not have a cmn on her

## 2021-06-27 NOTE — Telephone Encounter (Signed)
Called and spoke with pt's daughter [name] letting her know that we had not received a CMN on pt and she stated that she called and had this sent to her in her email. [name] said that she would take care of faxing it to [place].

## 2021-06-29 ENCOUNTER — Other Ambulatory Visit: Payer: Self-pay | Admitting: Family Medicine

## 2021-06-30 ENCOUNTER — Other Ambulatory Visit: Payer: Self-pay | Admitting: Family Medicine

## 2021-06-30 NOTE — Telephone Encounter (Signed)
Last office visit 03/05/21 for  DM/Neck Pain.  Last refilled 04/01/21 for 30 g with no refills.  CPE scheduled  for 09/05/21.

## 2021-07-04 DIAGNOSIS — J8489 Other specified interstitial pulmonary diseases: Secondary | ICD-10-CM | POA: Diagnosis not present

## 2021-07-04 DIAGNOSIS — G4733 Obstructive sleep apnea (adult) (pediatric): Secondary | ICD-10-CM | POA: Diagnosis not present

## 2021-07-05 ENCOUNTER — Other Ambulatory Visit: Payer: Self-pay | Admitting: Family Medicine

## 2021-07-08 ENCOUNTER — Other Ambulatory Visit: Payer: Self-pay | Admitting: Family Medicine

## 2021-07-08 NOTE — Telephone Encounter (Signed)
Last office visit 03/05/2021 for DM/Neck Pain.  Last refilled 07/01/2021 for 30 g with no refills.  CPE scheduled for 09/05/21.

## 2021-07-11 ENCOUNTER — Other Ambulatory Visit: Payer: Self-pay | Admitting: Family Medicine

## 2021-08-04 DIAGNOSIS — J8489 Other specified interstitial pulmonary diseases: Secondary | ICD-10-CM | POA: Diagnosis not present

## 2021-08-04 DIAGNOSIS — G4733 Obstructive sleep apnea (adult) (pediatric): Secondary | ICD-10-CM | POA: Diagnosis not present

## 2021-08-08 ENCOUNTER — Other Ambulatory Visit: Payer: Self-pay | Admitting: Family Medicine

## 2021-08-09 ENCOUNTER — Other Ambulatory Visit: Payer: Self-pay | Admitting: Family Medicine

## 2021-08-13 ENCOUNTER — Other Ambulatory Visit: Payer: Self-pay | Admitting: Family Medicine

## 2021-08-20 ENCOUNTER — Telehealth: Payer: Self-pay | Admitting: Family Medicine

## 2021-08-20 DIAGNOSIS — E118 Type 2 diabetes mellitus with unspecified complications: Secondary | ICD-10-CM

## 2021-08-20 DIAGNOSIS — E039 Hypothyroidism, unspecified: Secondary | ICD-10-CM

## 2021-08-20 NOTE — Telephone Encounter (Signed)
-----   Message from Ellamae Sia sent at 08/20/2021  7:39 AM EST ----- Regarding: Lab orders for Thursday, 2.9.23 Patient is scheduled for CPX labs, please order future labs, Thanks , Karna Christmas

## 2021-08-29 ENCOUNTER — Other Ambulatory Visit: Payer: Medicare Other

## 2021-08-29 ENCOUNTER — Other Ambulatory Visit: Payer: Self-pay | Admitting: Family Medicine

## 2021-09-04 DIAGNOSIS — J8489 Other specified interstitial pulmonary diseases: Secondary | ICD-10-CM | POA: Diagnosis not present

## 2021-09-04 DIAGNOSIS — G4733 Obstructive sleep apnea (adult) (pediatric): Secondary | ICD-10-CM | POA: Diagnosis not present

## 2021-09-05 ENCOUNTER — Other Ambulatory Visit: Payer: Self-pay

## 2021-09-05 ENCOUNTER — Ambulatory Visit (INDEPENDENT_AMBULATORY_CARE_PROVIDER_SITE_OTHER): Payer: Medicare Other | Admitting: Family Medicine

## 2021-09-05 ENCOUNTER — Encounter: Payer: Self-pay | Admitting: Family Medicine

## 2021-09-05 VITALS — BP 126/66 | HR 91 | Temp 97.6°F | Ht 65.5 in | Wt 215.1 lb

## 2021-09-05 DIAGNOSIS — Z0000 Encounter for general adult medical examination without abnormal findings: Secondary | ICD-10-CM

## 2021-09-05 DIAGNOSIS — I152 Hypertension secondary to endocrine disorders: Secondary | ICD-10-CM | POA: Diagnosis not present

## 2021-09-05 DIAGNOSIS — J9611 Chronic respiratory failure with hypoxia: Secondary | ICD-10-CM

## 2021-09-05 DIAGNOSIS — E785 Hyperlipidemia, unspecified: Secondary | ICD-10-CM

## 2021-09-05 DIAGNOSIS — E1159 Type 2 diabetes mellitus with other circulatory complications: Secondary | ICD-10-CM

## 2021-09-05 DIAGNOSIS — F5104 Psychophysiologic insomnia: Secondary | ICD-10-CM

## 2021-09-05 DIAGNOSIS — E1169 Type 2 diabetes mellitus with other specified complication: Secondary | ICD-10-CM | POA: Diagnosis not present

## 2021-09-05 DIAGNOSIS — J84112 Idiopathic pulmonary fibrosis: Secondary | ICD-10-CM | POA: Diagnosis not present

## 2021-09-05 DIAGNOSIS — E2839 Other primary ovarian failure: Secondary | ICD-10-CM

## 2021-09-05 DIAGNOSIS — E039 Hypothyroidism, unspecified: Secondary | ICD-10-CM

## 2021-09-05 DIAGNOSIS — Z0001 Encounter for general adult medical examination with abnormal findings: Secondary | ICD-10-CM

## 2021-09-05 DIAGNOSIS — E118 Type 2 diabetes mellitus with unspecified complications: Secondary | ICD-10-CM

## 2021-09-05 DIAGNOSIS — F3340 Major depressive disorder, recurrent, in remission, unspecified: Secondary | ICD-10-CM

## 2021-09-05 LAB — LIPID PANEL
Cholesterol: 229 mg/dL — ABNORMAL HIGH (ref 0–200)
HDL: 77.4 mg/dL (ref >39.00)
LDL Cholesterol: 114 mg/dL — ABNORMAL HIGH (ref 0–99)
NonHDL: 152
Total CHOL/HDL Ratio: 3
Triglycerides: 188 mg/dL — ABNORMAL HIGH (ref 0.0–149.0)
VLDL: 37.6 mg/dL (ref 0.0–40.0)

## 2021-09-05 LAB — HEMOGLOBIN A1C: Hgb A1c MFr Bld: 6.6 % — ABNORMAL HIGH (ref 4.6–6.5)

## 2021-09-05 LAB — COMPREHENSIVE METABOLIC PANEL
ALT: 8 U/L (ref 0–35)
AST: 11 U/L (ref 0–37)
Albumin: 4.7 g/dL (ref 3.5–5.2)
Alkaline Phosphatase: 63 U/L (ref 39–117)
BUN: 13 mg/dL (ref 6–23)
CO2: 38 mEq/L — ABNORMAL HIGH (ref 19–32)
Calcium: 10.4 mg/dL (ref 8.4–10.5)
Chloride: 97 mEq/L (ref 96–112)
Creatinine, Ser: 0.65 mg/dL (ref 0.40–1.20)
GFR: 88.98 mL/min (ref >60.00)
Glucose, Bld: 147 mg/dL — ABNORMAL HIGH (ref 70–99)
Potassium: 4.6 mEq/L (ref 3.5–5.1)
Sodium: 142 mEq/L (ref 135–145)
Total Bilirubin: 0.3 mg/dL (ref 0.2–1.2)
Total Protein: 7.4 g/dL (ref 6.0–8.3)

## 2021-09-05 LAB — TSH: TSH: 1.36 u[IU]/mL (ref 0.35–5.50)

## 2021-09-05 LAB — T3, FREE: T3, Free: 2.9 pg/mL (ref 2.3–4.2)

## 2021-09-05 LAB — HM DIABETES FOOT EXAM

## 2021-09-05 LAB — T4, FREE: Free T4: 1.17 ng/dL (ref 0.60–1.60)

## 2021-09-05 MED ORDER — BACLOFEN 10 MG PO TABS: 10.0000 mg | ORAL_TABLET | Freq: Three times a day (TID) | ORAL | 0 refills | Status: DC | PRN

## 2021-09-05 MED ORDER — NYSTATIN 100000 UNIT/GM EX CREA: 1.0000 "application " | TOPICAL_CREAM | Freq: Three times a day (TID) | CUTANEOUS | 0 refills | Status: DC

## 2021-09-05 MED ORDER — NORTRIPTYLINE HCL 10 MG PO CAPS: 10.0000 mg | ORAL_CAPSULE | Freq: Every day | ORAL | 3 refills | Status: DC

## 2021-09-05 NOTE — Assessment & Plan Note (Signed)
Chronic, due for eval.

## 2021-09-05 NOTE — Progress Notes (Signed)
Patient ID: Kaitlin Hamilton, female    DOB: 1951/07/03, 71 y.o.   MRN: [id number]  This visit was conducted in person.  BP 126/66    Pulse 91    Temp 97.6 F (36.4 C) (Temporal)    Ht 5' 5.5" (1.664 m)    Wt 215 lb 1 oz (97.6 kg)    SpO2 98% Comment: 4 L   BMI 35.24 kg/m    CC:  Chief Complaint  Patient presents with   Medicare Wellness    Subjective:   HPI: Kaitlin Hamilton is a 71 y.o. female presenting on 09/05/2021 for Medicare Wellness  The patient presents for annual medicare wellness, complete physical and review of chronic health problems. He/She also has the following acute concerns today:none  I have personally reviewed the Medicare Annual Wellness questionnaire and have noted 1. The patient's medical and social history 2. Their use of alcohol, tobacco or illicit drugs 3. Their current medications and supplements 4. The patient's functional ability including ADL's, fall risks, home safety risks and hearing or visual             impairment. 5. Diet and physical activities 6. Evidence for depression or mood disorders 7.         Updated provider list Cognitive evaluation was performed and recorded on pt medicare questionnaire form. The patients weight, height, BMI and visual acuity have been recorded in the chart   I have made referrals, counseling and provided education to the patient based review of the above and I have provided the pt with a written personalized care plan for preventive services.    Documentation of this information was scanned into the electronic record under the media tab.  Hearing Screening   500Hz  1000Hz  2000Hz  4000Hz   Right ear 40 20 20 0  Left ear 20 20 20  0  Vision Screening - Comments:: Last eye exam, 05/2021.  Falls: none in last year    MDD: inadequate control given she is isolated from family. Family has been ill.  She keeps in touch with friends. She is not interested medication or counseling.  She has trouble sleeping at night and is  interested in treating this. Ongoing for long time, > 1 year. SE to trazodone.  Using CPAP. Depression screen [facility] 2/9 09/05/2021 08/21/2020 01/24/2020 07/12/2019 02/04/2019  Decreased Interest 1 1 2  0 0  Down, Depressed, Hopeless 0 1 2 1  0  PHQ - 2 Score 1 2 4 1  0  Altered sleeping 3 3 3  0 -  Tired, decreased energy 3 2 3  0 -  Change in appetite 3 2 - 0 -  Feeling bad or failure about yourself  1 0 0 0 -  Trouble concentrating 1 0 0 0 -  Moving slowly or fidgety/restless 0 0 0 0 -  Suicidal thoughts 0 0 0 0 -  PHQ-9 Score 12 9 10 1  -  Difficult doing work/chores - Somewhat difficult Somewhat difficult Not difficult at all -  Some recent data might be hidden   UIP with chronic respiratory failure on continuous oxygen, followed by pulmonary.    Diabetes and  Cholesterol :   due for re-eval.  A1C was 5.9 with home insurance visit... she does have loose stool and would like to decrease metformin  if possible. Hypoglycemic episodes: Hyperglycemic episodes: Feet problems:none Blood Sugars averaging: eye exam within last year:  Hypothyroid  Due for re-eval. Lab Results  Component Value Date   TSH 2.03  07/26/2020     Hypertension:   Good control on losartan 50 mg daily, HCTZ 25 m daily and verapamil  BP Readings from Last 3 Encounters:  09/05/21 126/66  04/15/21 (!) 120/58  03/05/21 120/70    Using medication without problems or lightheadedness:  Chest pain with exertion: Edema: Short of breath: Average home BPs: Other issues:   Relevant past medical, surgical, family and social history reviewed and updated as indicated. Interim medical history since our last visit reviewed. Allergies and medications reviewed and updated. Outpatient Medications Prior to Visit  Medication Sig Dispense Refill   acetaminophen (TYLENOL) 500 MG tablet Take 1,000 mg by mouth every 6 (six) hours as needed for moderate pain.     Ascorbic Acid (VITAMIN C PO) Take by mouth.     baclofen (LIORESAL) 10  MG tablet Take 1 tablet (10 mg total) by mouth 3 (three) times daily as needed. 30 tablet 0   FARXIGA 10 MG TABS tablet Take 1 tablet (10 mg total) by mouth daily. 90 tablet 0   FIBER PO Take 1 tablet by mouth daily.     fluticasone (FLONASE) 50 MCG/ACT nasal spray Place 1 spray into both nostrils daily.     hydrochlorothiazide (HYDRODIURIL) 12.5 MG tablet TAKE 1 TABLET DAILY ALONG WITH ONE OF THE LOSARTAN 50 MG TABLETS 90 tablet 0   levothyroxine (SYNTHROID) 125 MCG tablet TAKE 1 TABLET BY MOUTH  DAILY BEFORE BREAKFAST 90 tablet 0   losartan (COZAAR) 50 MG tablet TAKE 1 TABLET DAILY ALONG WITH 1 OF THE HCTZ 12.5MG  TABLETS 30 tablet 0   metFORMIN (GLUCOPHAGE-XR) 500 MG 24 hr tablet Take 4 tablets (2,000 mg total) by mouth daily with breakfast. 360 tablet 1   montelukast (SINGULAIR) 10 MG tablet Take 1 tablet (10 mg total) by mouth at bedtime. 90 tablet 3   mycophenolate (CELLCEPT) 500 MG tablet TAKE 2 TABLETS BY MOUTH  TWICE DAILY 180 tablet 7   NON FORMULARY Place 4 L into the nose daily. 10 Liters with exertion     nystatin cream (MYCOSTATIN) Apply 1 application topically 3 (three) times daily. 30 g 0   nystatin ointment (MYCOSTATIN) SMARTSIG:1 Topical Daily PRN     OneTouch Delica Lancets 25Z MISC CHECK BLOOD SUGAR ONCE DAILY. 100 each 3   ONETOUCH ULTRA test strip CHECK BLOOD SUGAR DAILY. 50 strip 5   pantoprazole (PROTONIX) 40 MG tablet TAKE 1 TABLET BY MOUTH  TWICE DAILY 180 tablet 0   pioglitazone (ACTOS) 45 MG tablet Take 1 tablet (45 mg total) by mouth daily. 90 tablet 3   Pirfenidone 801 MG TABS Take 801 mg by mouth 3 (three) times daily with meals. 270 tablet 1   pravastatin (PRAVACHOL) 40 MG tablet TAKE 1 TABLET DAILY. 90 tablet 1   Probiotic Product (PROBIOTIC DAILY PO) Take by mouth.     verapamil (CALAN-SR) 180 MG CR tablet TAKE 1 TABLET BY MOUTH  DAILY 90 tablet 0   No facility-administered medications prior to visit.     Per HPI unless specifically indicated in ROS section  below Review of Systems  Constitutional:  Negative for fatigue and fever.  HENT:  Negative for congestion.   Eyes:  Negative for pain.  Respiratory:  Negative for cough and shortness of breath.   Cardiovascular:  Negative for chest pain, palpitations and leg swelling.  Gastrointestinal:  Negative for abdominal pain.  Genitourinary:  Negative for dysuria and vaginal bleeding.  Musculoskeletal:  Negative for back pain.  Neurological:  Negative for syncope, light-headedness and headaches.  Psychiatric/Behavioral:  Negative for dysphoric mood.   Objective:  BP 126/66    Pulse 91    Temp 97.6 F (36.4 C) (Temporal)    Ht 5' 5.5" (1.664 m)    Wt 215 lb 1 oz (97.6 kg)    SpO2 98% Comment: 4 L   BMI 35.24 kg/m   Wt Readings from Last 3 Encounters:  09/05/21 215 lb 1 oz (97.6 kg)  04/15/21 227 lb (103 kg)  03/05/21 233 lb 8 oz (105.9 kg)      Physical Exam Vitals and nursing note reviewed.  Constitutional:      General: She is not in acute distress.    Appearance: Normal appearance. She is well-developed. She is not ill-appearing or toxic-appearing.  HENT:     Head: Normocephalic.     Right Ear: Hearing, tympanic membrane, ear canal and external ear normal.     Left Ear: Hearing, tympanic membrane, ear canal and external ear normal.     Nose: Nose normal.  Eyes:     General: Lids are normal. Lids are everted, no foreign bodies appreciated.     Conjunctiva/sclera: Conjunctivae normal.     Pupils: Pupils are equal, round, and reactive to light.  Neck:     Thyroid: No thyroid mass or thyromegaly.     Vascular: No carotid bruit.     Trachea: Trachea normal.  Cardiovascular:     Rate and Rhythm: Normal rate and regular rhythm.     Heart sounds: Normal heart sounds, S1 normal and S2 normal. No murmur heard.   No gallop.  Pulmonary:     Effort: Pulmonary effort is normal. No respiratory distress.     Breath sounds: Decreased air movement present. Decreased breath sounds present. No  wheezing, rhonchi or rales.  Abdominal:     General: Bowel sounds are normal. There is no distension or abdominal bruit.     Palpations: Abdomen is soft. There is no fluid wave or mass.     Tenderness: There is no abdominal tenderness. There is no guarding or rebound.     Hernia: No hernia is present.  Musculoskeletal:     Cervical back: Normal range of motion and neck supple.  Lymphadenopathy:     Cervical: No cervical adenopathy.  Skin:    General: Skin is warm and dry.     Findings: No rash.  Neurological:     Mental Status: She is alert.     Cranial Nerves: No cranial nerve deficit.     Sensory: No sensory deficit.  Psychiatric:        Mood and Affect: Mood is not anxious or depressed.        Speech: Speech normal.        Behavior: Behavior normal. Behavior is cooperative.        Judgment: Judgment normal.      Diabetic foot exam through insurance Normal inspection No skin breakdown No calluses  Normal DP pulses Normal sensation to light touch and monofilament Nails normal  Wt Readings from Last 3 Encounters:  09/05/21 215 lb 1 oz (97.6 kg)  04/15/21 227 lb (103 kg)  03/05/21 233 lb 8 oz (105.9 kg)     Results for orders placed or performed in visit on 02/12/21  Comprehensive metabolic panel  Result Value Ref Range   Sodium 139 135 - 145 mEq/L   Potassium 4.0 3.5 - 5.1 mEq/L   Chloride 99 96 - 112 mEq/L  CO2 31 19 - 32 mEq/L   Glucose, Bld 140 (H) 70 - 99 mg/dL   BUN 10 6 - 23 mg/dL   Creatinine, Ser 0.73 0.40 - 1.20 mg/dL   Total Bilirubin 0.3 0.2 - 1.2 mg/dL   Alkaline Phosphatase 65 39 - 117 U/L   AST 12 0 - 37 U/L   ALT 10 0 - 35 U/L   Total Protein 7.6 6.0 - 8.3 g/dL   Albumin 4.5 3.5 - 5.2 g/dL   GFR 83.44 >60.00 mL/min   Calcium 10.1 8.4 - 10.5 mg/dL  Lipid panel  Result Value Ref Range   Cholesterol 200 0 - 200 mg/dL   Triglycerides 165.0 (H) 0.0 - 149.0 mg/dL   HDL 75.00 >39.00 mg/dL   VLDL 33.0 0.0 - 40.0 mg/dL   LDL Cholesterol 92 0 - 99  mg/dL   Total CHOL/HDL Ratio 3    NonHDL 125.33   Hemoglobin A1c  Result Value Ref Range   Hgb A1c MFr Bld 6.6 (H) 4.6 - 6.5 %    This visit occurred during the SARS-CoV-2 public health emergency.  Safety protocols were in place, including screening questions prior to the visit, additional usage of staff PPE, and extensive cleaning of exam room while observing appropriate contact time as indicated for disinfecting solutions.   COVID 19 screen:  No recent travel or known exposure to COVID19 The patient denies respiratory symptoms of COVID 19 at this time. The importance of social distancing was discussed today.   Assessment and Plan The patient's preventative maintenance and recommended screening tests for an annual wellness exam were reviewed in full today. Brought up to date unless services declined.  Counselled on the importance of diet, exercise, and its role in overall health and mortality. The patient's FH and SH was reviewed, including their home life, tobacco status, and drug and alcohol status.   Vaccines:  Uptodate with PNA, flu, COVID x 3  Not a candidate for  shingles vaccine per pulm.  Colon:  2014, Dr. Hilarie Fredrickson, sessile polyps, recommended repeat in 3 year. Pt refused given sedation needed and respiratory status.   She is not interested in screening at this point... will reconsider if new GI issue starts.    PAP/DVE: Pap not indicated.  Daughter with ovarian cancer. Asymptomatic today  Nonsmoker HIV: refused  mammo: nml 04/2020   Plan DEXA at next mammogram visit Hep C negative  COVID  She does not want shingrix vaccine.  Problem List Items Addressed This Visit     Chronic hypoxemic respiratory failure (HCC) (Chronic)    Stable on  continuous oxygen 3L      Chronic insomnia (Chronic)     SE to trazodone in past... hesitant to use benzo in pt with her respiratory issues.  Will try low dose of nortriptyline.  Work on Publishing rights manager.      Controlled diabetes  mellitus type 2 with complications (HCC) (Chronic)     Chronic, Due for re-eval. If well controlled consider decrease in metformin given side effects      Hyperlipidemia associated with type 2 diabetes mellitus (HCC) (Chronic)    Chronic, due for eval.      Hypertension associated with diabetes (HCC) (Chronic)    Stable, chronic.  Continue current medication.    losartan 50 mg daily HCTZ 25 mg daily  Verapamil 180 mg daily      Hypothyroidism (Chronic)     Due for re-eval.      MDD (recurrent  major depressive disorder) in remission (HCC) (Chronic)    Moderate control. She is not interested in counseling or medication to treat. She states  She has a good support system.      Relevant Medications   nortriptyline (PAMELOR) 10 MG capsule   UIP (usual interstitial pneumonitis) (HCC) (Chronic)    Chronic, followed by pulmonary      Other Visit Diagnoses     Medicare annual wellness visit, subsequent    -  Primary   Estrogen deficiency       Relevant Orders   DG Bone Density       [name], MD

## 2021-09-05 NOTE — Assessment & Plan Note (Signed)
Stable on  continuous oxygen 3L

## 2021-09-05 NOTE — Assessment & Plan Note (Signed)
SE to trazodone in past... hesitant to use benzo in pt with her respiratory issues.  Will try low dose of nortriptyline.  Work on Publishing rights manager.

## 2021-09-05 NOTE — Assessment & Plan Note (Signed)
Moderate control. She is not interested in counseling or medication to treat. She states  She has a good support system.

## 2021-09-05 NOTE — Assessment & Plan Note (Signed)
Chronic, Due for re-eval. If well controlled consider decrease in metformin given side effects

## 2021-09-05 NOTE — Assessment & Plan Note (Signed)
Stable, chronic.  Continue current medication.    losartan 50 mg daily HCTZ 25 mg daily  Verapamil 180 mg daily

## 2021-09-05 NOTE — Assessment & Plan Note (Signed)
Chronic, followed by pulmonary. 

## 2021-09-05 NOTE — Patient Instructions (Addendum)
Work on increasing activity as well as sleep hygiene. Change caffeine to earlier in the day. Start nortriptyline for sleep at night.  Please stop at the lab to have labs drawn.

## 2021-09-05 NOTE — Assessment & Plan Note (Signed)
Due for re-eval. 

## 2021-09-09 ENCOUNTER — Other Ambulatory Visit: Payer: Self-pay | Admitting: Family Medicine

## 2021-09-10 ENCOUNTER — Other Ambulatory Visit: Payer: Self-pay | Admitting: Pulmonary Disease

## 2021-09-10 DIAGNOSIS — J849 Interstitial pulmonary disease, unspecified: Secondary | ICD-10-CM

## 2021-09-13 NOTE — Progress Notes (Signed)
X °

## 2021-09-16 ENCOUNTER — Other Ambulatory Visit: Payer: Self-pay | Admitting: Family Medicine

## 2021-09-19 NOTE — Progress Notes (Signed)
x

## 2021-09-20 ENCOUNTER — Other Ambulatory Visit: Payer: Self-pay | Admitting: Family Medicine

## 2021-09-20 NOTE — Telephone Encounter (Signed)
Last office visit 09/05/2021 for [place].  Last refilled 09/05/2021 for #30 with no refills. Next Appt: 03/05/2022 for 6 month follow up.  ?

## 2021-09-30 ENCOUNTER — Other Ambulatory Visit: Payer: Self-pay | Admitting: Pulmonary Disease

## 2021-10-02 DIAGNOSIS — G4733 Obstructive sleep apnea (adult) (pediatric): Secondary | ICD-10-CM | POA: Diagnosis not present

## 2021-10-02 DIAGNOSIS — J8489 Other specified interstitial pulmonary diseases: Secondary | ICD-10-CM | POA: Diagnosis not present

## 2021-10-07 ENCOUNTER — Ambulatory Visit: Payer: Medicare Other | Admitting: Pulmonary Disease

## 2021-10-07 ENCOUNTER — Other Ambulatory Visit: Payer: Self-pay | Admitting: Family Medicine

## 2021-10-07 DIAGNOSIS — G4733 Obstructive sleep apnea (adult) (pediatric): Secondary | ICD-10-CM | POA: Diagnosis not present

## 2021-10-21 ENCOUNTER — Other Ambulatory Visit: Payer: Self-pay

## 2021-10-21 ENCOUNTER — Other Ambulatory Visit: Payer: Self-pay | Admitting: Family Medicine

## 2021-10-21 DIAGNOSIS — R928 Other abnormal and inconclusive findings on diagnostic imaging of breast: Secondary | ICD-10-CM

## 2021-10-21 DIAGNOSIS — N63 Unspecified lump in unspecified breast: Secondary | ICD-10-CM

## 2021-10-24 ENCOUNTER — Telehealth: Payer: Self-pay

## 2021-10-24 NOTE — Progress Notes (Signed)
? ? ?  Chronic Care Management ?Pharmacy Assistant  ? ?Name: Kaitlin Hamilton  MRN: [id number] DOB: 1951-07-04 ? ?Reason for Encounter: CCM Counsellor) ? ?Medications: ?Outpatient Encounter Medications as of 10/24/2021  ?Medication Sig  ? acetaminophen (TYLENOL) 500 MG tablet Take 1,000 mg by mouth every 6 (six) hours as needed for moderate pain.  ? Ascorbic Acid (VITAMIN C PO) Take by mouth.  ? baclofen (LIORESAL) 10 MG tablet Take 1 tablet (10 mg total) by mouth 3 (three) times daily as needed.  ? FARXIGA 10 MG TABS tablet Take 1 tablet (10 mg total) by mouth daily.  ? FIBER PO Take 1 tablet by mouth daily.  ? fluticasone (FLONASE) 50 MCG/ACT nasal spray Place 1 spray into both nostrils daily.  ? hydrochlorothiazide (HYDRODIURIL) 12.5 MG tablet TAKE 1 TABLET DAILY ALONG WITH ONE OF THE LOSARTAN 50 MG TABLETS  ? levothyroxine (SYNTHROID) 125 MCG tablet TAKE 1 TABLET BY MOUTH DAILY  BEFORE BREAKFAST  ? losartan (COZAAR) 50 MG tablet TAKE 1 TABLET DAILY ALONG WITH 1 OF THE HCTZ 12.'5MG'$  TABLETS  ? metFORMIN (GLUCOPHAGE-XR) 500 MG 24 hr tablet Take 4 tablets (2,000 mg total) by mouth daily with breakfast.  ? montelukast (SINGULAIR) 10 MG tablet Take 1 tablet (10 mg total) by mouth at bedtime.  ? mycophenolate (CELLCEPT) 500 MG tablet TAKE 2 TABLETS BY MOUTH  TWICE DAILY  ? NON FORMULARY Place 4 L into the nose daily. 10 Liters with exertion  ? nortriptyline (PAMELOR) 10 MG capsule Take 1 capsule (10 mg total) by mouth at bedtime. for insomnia  ? nystatin cream (MYCOSTATIN) Apply 1 application topically 3 (three) times daily.  ? nystatin ointment (MYCOSTATIN) SMARTSIG:1 Topical Daily PRN  ? OneTouch Delica Lancets 95A MISC CHECK BLOOD SUGAR ONCE DAILY.  ? ONETOUCH ULTRA test strip CHECK BLOOD SUGAR DAILY.  ? pantoprazole (PROTONIX) 40 MG tablet TAKE 1 TABLET BY MOUTH TWICE  DAILY  ? pioglitazone (ACTOS) 45 MG tablet Take 1 tablet (45 mg total) by mouth daily.  ? Pirfenidone 801 MG TABS TAKE 1 TABLET ('801MG'$ ) BY  MOUTH  THREE TIMES DAILY  WITH FOOD  ? pravastatin (PRAVACHOL) 40 MG tablet TAKE 1 TABLET DAILY.  ? Probiotic Product (PROBIOTIC DAILY PO) Take by mouth.  ? verapamil (CALAN-SR) 180 MG CR tablet TAKE 1 TABLET BY MOUTH DAILY  ? ?No facility-administered encounter medications on file as of 10/24/2021.  ? ?JAMILETT FERRANTE was contacted to remind of upcoming telephone visit with Charlene Brooke on 10/29/2021 at 8:45. Patient was reminded to have any blood glucose and blood pressure readings available for review at appointment.  ? ?Message was left reminding patient of appointment. ? ?Star Rating Drugs: ?Medication:  Last Fill: Day Supply ?Pravastatin 40 mg 08/13/2021 90 ?Losartan 50 mg 09/10/2021 90  ?Metfromin 500 mg 09/12/2021 90 ?Farxiga 10 mg  10/08/2021 90 ? ?Charlene Brooke, CPP notified ? ?Marijean Niemann, RMA ?Clinical Pharmacy Assistant ?[phone] ? ? ? ? ? ?

## 2021-10-25 ENCOUNTER — Other Ambulatory Visit: Payer: Self-pay | Admitting: Family Medicine

## 2021-10-29 ENCOUNTER — Telehealth: Payer: Self-pay | Admitting: Pharmacist

## 2021-10-29 ENCOUNTER — Ambulatory Visit (INDEPENDENT_AMBULATORY_CARE_PROVIDER_SITE_OTHER): Payer: Medicare Other | Admitting: Pharmacist

## 2021-10-29 DIAGNOSIS — E118 Type 2 diabetes mellitus with unspecified complications: Secondary | ICD-10-CM

## 2021-10-29 DIAGNOSIS — E1159 Type 2 diabetes mellitus with other circulatory complications: Secondary | ICD-10-CM

## 2021-10-29 DIAGNOSIS — E1169 Type 2 diabetes mellitus with other specified complication: Secondary | ICD-10-CM

## 2021-10-29 DIAGNOSIS — F5104 Psychophysiologic insomnia: Secondary | ICD-10-CM

## 2021-10-29 DIAGNOSIS — J84112 Idiopathic pulmonary fibrosis: Secondary | ICD-10-CM

## 2021-10-29 NOTE — Telephone Encounter (Signed)
1 - Patient agreed to change pravastatin as recent LDL was above goal. ?Recommend rosuvastatin 10 mg daily. Routing to PCP for approval. ? ?Lipid Panel  ?   ?Component Value Date/Time  ? CHOL 229 (H) 09/05/2021 1135  ? TRIG 188.0 (H) 09/05/2021 1135  ? HDL 77.40 09/05/2021 1135  ? CHOLHDL 3 09/05/2021 1135  ? VLDL 37.6 09/05/2021 1135  ? LDLCALC 114 (H) 09/05/2021 1135  ? LDLDIRECT 104.0 09/04/2017 0956  ? ? ?2- Patient also reports she has a spot on her face by her L eyebrow that she has been applying OTC Hydrocortisone cream to twice a day. She reports she has discussed this with PCP previously and was advised to continue OTC cream for a few more weeks then call if it does not improve. She reports now that is has not improved and she is wondering about an Rx cream to help with the issue. ? ?Forwarding to PCP for recommendations. ? ?Preferred pharmacy: ?[facility] [place] - [address] ?[address] ?[place] [address] ?Phone: [phone] Fax: [phone] ?

## 2021-10-29 NOTE — Progress Notes (Signed)
? ?Chronic Care Management ?Pharmacy Note ? ?11/07/2021 ?Name:  Kaitlin Hamilton MRN:  [id number] DOB:  [birth year] ? ?Summary: CCM F/U visit ?-Pt agreed to change statin (LDL 114, ASCVD 21%) ?-Pt stopped nortriptyline due to "hangover" effect the next morning. She declines additional medication at this time. ?-Pt reports spot/rash by her L eyebrow, she has been using OTC hydrocortisone cream for several weeks without improvement, she asks about Rx cream ? ?Recommendations/Changes made from today's visit: ?-Stop pravastatin, start rosuvastatin 10 mg daily (see phone note per PCP approval). Repeat lipid panel at upcoming PCP appt in August ?-Removed nortriptyline from med list. Try Melatonin for sleep. ? ?Plan: ?-[facility] will call patient 1 month for statin tolerability ?-Pharmacist follow up televisit scheduled for 6 months ?-PCP 03/06/22 ? ? ? ?Subjective: ?Kaitlin Hamilton is an 71 y.o. year old female who is a primary patient of Bedsole, Amy E, MD.  The CCM team was consulted for assistance with disease management and care coordination needs.   ? ?Engaged with patient by telephone for follow up visit in response to provider referral for pharmacy case management and/or care coordination services.  ? ?Consent to Services:  ?The patient was given information about Chronic Care Management services, agreed to services, and gave verbal consent prior to initiation of services.  Please see initial visit note for detailed documentation.  ? ?Patient Care Team: ?[name], MD as PCP - General (Family Medicine) ?Juanito Doom, MD as Consulting Physician (Pulmonary Disease) ?Jaidon Ellery, Cleaster Corin, [facility] as Pharmacist (Pharmacist) ? ?Recent office visits: ?09/05/21 Dr [name] OV: AWV - LDL worsened. Consider changing statin - pt did not respond to message. ? ?Recent consult visits: ?04/15/21 Dr Vaughan Browner (Pulmonary): f/u UIP - no changes. F/u 6 months. ? ?Hospital visits: ?None in previous 6 months ? ? ?Objective: ? ?Lab  Results  ?Component Value Date  ? CREATININE 0.65 09/05/2021  ? BUN 13 09/05/2021  ? GFR 88.98 09/05/2021  ? GFRNONAA >60 07/17/2019  ? GFRAA >60 07/17/2019  ? NA 142 09/05/2021  ? K 4.6 09/05/2021  ? CALCIUM 10.4 09/05/2021  ? CO2 38 (H) 09/05/2021  ? GLUCOSE 147 (H) 09/05/2021  ? ? ?Lab Results  ?Component Value Date/Time  ? HGBA1C 6.6 (H) 09/05/2021 11:35 AM  ? HGBA1C 6.6 (H) 02/12/2021 10:03 AM  ? GFR 88.98 09/05/2021 11:35 AM  ? GFR 83.44 02/12/2021 10:03 AM  ? MICROALBUR 5.1 (H) 11/08/2013 08:58 AM  ?  ?Last diabetic Eye exam:  ?Lab Results  ?Component Value Date/Time  ? HMDIABEYEEXA No Retinopathy 04/17/2020 12:00 AM  ?  ?Last diabetic Foot exam:  ?Lab Results  ?Component Value Date/Time  ? HMDIABFOOTEX done 09/05/2021 12:00 AM  ?  ? ?Lab Results  ?Component Value Date  ? CHOL 229 (H) 09/05/2021  ? HDL 77.40 09/05/2021  ? LDLCALC 114 (H) 09/05/2021  ? LDLDIRECT 104.0 09/04/2017  ? TRIG 188.0 (H) 09/05/2021  ? CHOLHDL 3 09/05/2021  ? ? ? ?  Latest Ref Rng & Units 09/05/2021  ? 11:35 AM 02/12/2021  ? 10:03 AM 07/26/2020  ? 11:18 AM  ?Hepatic Function  ?Total Protein 6.0 - 8.3 g/dL 7.4   7.6   7.6    ?Albumin 3.5 - 5.2 g/dL 4.7   4.5   4.9    ?AST 0 - 37 U/L '11   12   11    ' ?ALT 0 - 35 U/L '8   10   8    ' ?Alk  Phosphatase 39 - 117 U/L 63   65   68    ?Total Bilirubin 0.2 - 1.2 mg/dL 0.3   0.3   0.5    ? ? ?Lab Results  ?Component Value Date/Time  ? TSH 1.36 09/05/2021 11:35 AM  ? TSH 2.03 07/26/2020 11:18 AM  ? FREET4 1.17 09/05/2021 11:35 AM  ? FREET4 1.16 07/26/2020 11:18 AM  ? ? ? ?  Latest Ref Rng & Units 01/09/2021  ? 10:59 AM 10/01/2020  ? 11:42 AM 07/17/2019  ?  2:05 PM  ?CBC  ?WBC 4.0 - 10.5 K/uL 8.3   8.6   10.5    ?Hemoglobin 12.0 - 15.0 g/dL 12.6   12.8   13.9    ?Hematocrit 36.0 - 46.0 % 39.1   39.5   42.0    ?Platelets 150.0 - 400.0 K/uL 280.0   270.0   225    ? ? ?No results found for: VD25OH ? ?Clinical ASCVD: No  ?The 10-year ASCVD risk score (Arnett DK, et al., 2019) is: 21.8% ?  Values used to  calculate the score: ?    Age: 22 years ?    Sex: Female ?    Is Non-Hispanic African American: No ?    Diabetic: Yes ?    Tobacco smoker: No ?    Systolic Blood Pressure: 109 mmHg ?    Is BP treated: Yes ?    HDL Cholesterol: 77.4 mg/dL ?    Total Cholesterol: 229 mg/dL   ? ? ?  09/05/2021  ? 11:18 AM 09/05/2021  ? 10:23 AM 08/21/2020  ?  4:08 PM  ?Depression screen PHQ 2/9  ?Decreased Interest '1 1 1  ' ?Down, Depressed, Hopeless 0 0 1  ?PHQ - 2 Score '1 1 2  ' ?Altered sleeping '3 3 3  ' ?Tired, decreased energy '3 3 2  ' ?Change in appetite '3 3 2  ' ?Feeling bad or failure about yourself  1 1 0  ?Trouble concentrating 1 1 0  ?Moving slowly or fidgety/restless 0 0 0  ?Suicidal thoughts 0 0 0  ?PHQ-9 Score '12 12 9  ' ?Difficult doing work/chores   Somewhat difficult  ?  ?Social History  ? ?Tobacco Use  ?Smoking Status Never  ?Smokeless Tobacco Never  ? ?BP Readings from Last 3 Encounters:  ?09/05/21 126/66  ?04/15/21 (!) 120/58  ?03/05/21 120/70  ? ?Pulse Readings from Last 3 Encounters:  ?09/05/21 91  ?04/15/21 85  ?03/05/21 74  ? ?Wt Readings from Last 3 Encounters:  ?09/05/21 215 lb 1 oz (97.6 kg)  ?04/15/21 227 lb (103 kg)  ?03/05/21 233 lb 8 oz (105.9 kg)  ? ?BMI Readings from Last 3 Encounters:  ?09/05/21 35.24 kg/m?  ?04/15/21 36.64 kg/m?  ?03/05/21 38.27 kg/m?  ? ? ?Assessment/Interventions: Review of patient past medical history, allergies, medications, health status, including review of consultants reports, laboratory and other test data, was performed as part of comprehensive evaluation and provision of chronic care management services.  ? ?SDOH:  (Social Determinants of Health) assessments and interventions performed: Yes ?SDOH Interventions   ? ?Flowsheet Row Most Recent Value  ?SDOH Interventions   ?Food Insecurity Interventions Intervention Not Indicated  ?Financial Strain Interventions Intervention Not Indicated  ? ?  ? ?SDOH Screenings  ? ?Alcohol Screen: Not on file  ?Depression (PHQ2-9): Medium Risk  ? PHQ-2 Score:  12  ?Financial Resource Strain: Low Risk   ? Difficulty of Paying Living Expenses: Not very hard  ?Food Insecurity: No Food Insecurity  ?  Worried About Charity fundraiser in the Last Year: Never true  ? Ran Out of Food in the Last Year: Never true  ?Housing: Not on file  ?Physical Activity: Not on file  ?Social Connections: Not on file  ?Stress: Not on file  ?Tobacco Use: Low Risk   ? Smoking Tobacco Use: Never  ? Smokeless Tobacco Use: Never  ? Passive Exposure: Not on file  ?Transportation Needs: Not on file  ? ? ?Carbon ? ?Allergies  ?Allergen Reactions  ? Adhesive [Tape]   ?  Redness, bruising with any adhesives- bandaids, patches, etc.   ? Atovaquone Itching  ? Codeine Hives and Swelling  ? Demerol [Meperidine] Hives and Swelling  ? Hydrocodone Nausea Only  ? Trazodone And Nefazodone Itching  ? Sulfa Antibiotics Rash  ? ? ?Medications Reviewed Today   ? ? Reviewed by Charlton Haws, [facility] (Pharmacist) on 10/29/21 at [place] List Status: <None>  ? ?Medication Order Taking? Sig Documenting Provider Last Dose Status Informant  ?acetaminophen (TYLENOL) 500 MG tablet [id number] Yes Take 1,000 mg by mouth every 6 (six) hours as needed for moderate pain. [provider] Taking Active Family Member  ?Ascorbic Acid (VITAMIN C PO) [id number] Yes Take by mouth. [provider] Taking Active Self  ?baclofen (LIORESAL) 10 MG tablet [id number] Yes Take 1 tablet (10 mg total) by mouth 3 (three) times daily as needed. [name], MD Taking Active   ?FARXIGA 10 MG TABS tablet [id number] Yes Take 1 tablet (10 mg total) by mouth daily. [name], MD Taking Active   ?FIBER PO [id number] Yes Take 1 tablet by mouth daily. [provider] Taking Active Family Member  ?fluticasone (FLONASE) 50 MCG/ACT nasal spray [id number] Yes Place 1 spray into both nostrils daily. [provider] Taking Active   ?hydrochlorothiazide (HYDRODIURIL) 12.5 MG tablet [id number] Yes TAKE 1 TABLET DAILY  ALONG WITH ONE OF THE LOSARTAN 85 MG TABLETS Bedsole, Amy E, MD Taking Active   ?levothyroxine (SYNTHROID) 125 MCG tablet [id number] Yes TAKE 1 TABLET BY MOUTH DAILY  BEFORE BREAKFAST Bedsole, Amy E, MD Taking Act

## 2021-10-30 MED ORDER — ROSUVASTATIN CALCIUM 10 MG PO TABS: 10.0000 mg | ORAL_TABLET | Freq: Every day | ORAL | 3 refills | Status: DC

## 2021-10-30 NOTE — Addendum Note (Signed)
Addended by: Charlton Haws on: 10/30/2021 08:07 AM ? ? Modules accepted: Orders ? ?

## 2021-11-01 ENCOUNTER — Other Ambulatory Visit: Payer: Self-pay | Admitting: Family Medicine

## 2021-11-01 NOTE — Telephone Encounter (Signed)
Last office visit 09/05/21 for [place].  Last refilled 09/20/21 for #30 with no refills.  Next Appt: 03/06/22 for DM check. ?

## 2021-11-02 DIAGNOSIS — G4733 Obstructive sleep apnea (adult) (pediatric): Secondary | ICD-10-CM | POA: Diagnosis not present

## 2021-11-02 DIAGNOSIS — J8489 Other specified interstitial pulmonary diseases: Secondary | ICD-10-CM | POA: Diagnosis not present

## 2021-11-04 ENCOUNTER — Other Ambulatory Visit: Payer: Self-pay | Admitting: Family Medicine

## 2021-11-04 NOTE — Telephone Encounter (Signed)
Last office visit 09/05/2021 for Kaitlin Hamilton.  Last refilled 04/07/023 for 30 g with no refills.  Next Appt: 03/06/2022 for DM.  ?

## 2021-11-07 NOTE — Patient Instructions (Signed)
Visit Information ? ?Phone number for Pharmacist: [phone] ? ? Goals Addressed   ?None ?  ? ? ?Care Plan : [place]  ?Updates made by Charlton Haws, RPH since 11/07/2021 12:00 AM  ?  ? ?Problem: Hypertension, Hyperlipidemia, and Diabetes, UIP, insomnia   ?Priority: High  ?  ? ?Long-Range Goal: Disease Management   ?Start Date: 10/03/2020  ?Expected End Date: 11/08/2022  ?This Visit's Progress: On track  ?Priority: High  ?Note:   ?Current Barriers:  ?Suboptimal therapeutic regimen for HLD ? ?Pharmacist Clinical Goal(s):  ?Patient will adhere to plan to optimize therapeutic regimen for HLD as evidenced by report of adherence to recommended medication management changes through collaboration with PharmD and provider.  ? ?Interventions: ?1:1 collaboration with [name], MD regarding development and update of comprehensive plan of care as evidenced by provider attestation and co-signature ?Inter-disciplinary care team collaboration (see longitudinal plan of care) ?Comprehensive medication review performed; medication list updated in electronic medical record ? ?Hyperlipidemia (LDL goal < 100) ?-Not ideally controlled - LDL 114 (08/2021) worsened from previous; PCP has asked to change to more effective statin, pt did not respond ?-Current treatment: ?Pravastatin 40 mg daily PM - Appropriate, Query Effective ?-Medications previously tried: Atorvastatin (muscle pain) ?-Educated on Cholesterol goals; Benefits of statin for ASCVD risk reduction; ?-Recommended to change to rosuvastatin 10 mg (see phone note) ? ?Hypertension (BP goal <140/90) ?-Controlled - via clinic readings; Denies hypotensive/hypertensive symptoms ?-Current treatment: ?HCTZ 12.5 mg daily - Appropriate, Effective, Safe, Accessible ?Verapamil ER 180 mg daily -Appropriate, Effective, Safe, Accessible ?Losartan 50 mg daily -Appropriate, Effective, Safe, Accessible ?-Medications previously tried: none  ?-Monitor BP at home  periodically ?-Recommended to continue current medication ? ?Diabetes (A1c goal <7%) ?-Controlled - A1c 6.6% (08/2021) ?-Current home glucose readings - checking daily ?fasting glucose: 100-140s ?post prandial glucose: none  ?-Current medications: ?Farxiga 10 mg daily -Appropriate, Effective, Safe, Accessible ?Metformin '500mg'$  ER - 4 tab AM -Appropriate, Effective, Safe, Accessible ?Pioglitazone 45 mg daily -Appropriate, Effective, Safe, Accessible ?-Medications previously tried: none  ?-Current meal patterns: 1 meal a day. Usually limits meats and eats some sort of vegetable such as corn, sweet potatoes or butter beans.  ?-Current exercise: minimal due to lung condition ?-Recommended to continue current medication ? ?UIP (usual interstitial pneumonitis) (Goal: manage symptoms) ?-Controlled ?-Hx OSA, wearing CPAP nightly; follows with pulmonology ?-Current treatment  ?Pirfenidone 801 mg TID -Appropriate, Effective, Safe, Accessible ?Mycophenolate 500 mg - 2 tab BID -Appropriate, Effective, Safe, Accessible ?Oxygen ?-Medications previously tried: n/a  ?-Recommended to continue current medication ? ?Depression / Insomnia (Goal: manage symptoms) ?-Not ideally controlled - stopped taking nortriptyline due to hangover effect in morning, even though it was not really helping her fall asleep. Pt is not interested in trying a new medication at this point.  ?-PHQ9: 12 (08/2021) - moderate depression ?-Current treatment  ?Nortriptyline 10 mg HS - stopped taking ?-Medications previously tried: trazodone (SE)  ?-Advised to contact PCP if she wants to try another medication ? ?Patient Goals/Self-Care Activities ?Patient will:  ?- take medications as prescribed as evidenced by patient report and record review ?focus on medication adherence by pill box ?check glucose daily, document, and provide at future appointments ?check blood pressure periodically, document, and provide at future appointments ? ?  ?  ? ?Patient verbalizes  understanding of instructions and care plan provided today and agrees to view in [place]. Active MyChart status confirmed with patient.   ?Telephone follow up appointment with pharmacy  team member scheduled for: 6 months ? ?Charlene Brooke, PharmD, BCACP ?Clinical Pharmacist ?[facility] ?[phone] ?  ?

## 2021-11-08 ENCOUNTER — Ambulatory Visit: Payer: Medicare Other | Admitting: Pulmonary Disease

## 2021-11-08 ENCOUNTER — Encounter: Payer: Self-pay | Admitting: Pulmonary Disease

## 2021-11-08 VITALS — BP 122/64 | HR 76 | Ht 66.0 in | Wt 213.4 lb

## 2021-11-08 DIAGNOSIS — G4733 Obstructive sleep apnea (adult) (pediatric): Secondary | ICD-10-CM

## 2021-11-08 DIAGNOSIS — J84112 Idiopathic pulmonary fibrosis: Secondary | ICD-10-CM

## 2021-11-08 DIAGNOSIS — Z5181 Encounter for therapeutic drug level monitoring: Secondary | ICD-10-CM

## 2021-11-08 DIAGNOSIS — R0600 Dyspnea, unspecified: Secondary | ICD-10-CM | POA: Diagnosis not present

## 2021-11-08 LAB — CBC WITH DIFFERENTIAL/PLATELET
Basophils Absolute: 0.1 10*3/uL (ref 0.0–0.1)
Basophils Relative: 0.6 % (ref 0.0–3.0)
Eosinophils Absolute: 0.1 10*3/uL (ref 0.0–0.7)
Eosinophils Relative: 1.4 % (ref 0.0–5.0)
HCT: 39.1 % (ref 36.0–46.0)
Hemoglobin: 12.3 g/dL (ref 12.0–15.0)
Lymphocytes Relative: 17.3 % (ref 12.0–46.0)
Lymphs Abs: 1.6 10*3/uL (ref 0.7–4.0)
MCHC: 31.6 g/dL (ref 30.0–36.0)
MCV: 95.8 fl (ref 78.0–100.0)
Monocytes Absolute: 0.6 10*3/uL (ref 0.1–1.0)
Monocytes Relative: 6 % (ref 3.0–12.0)
Neutro Abs: 6.9 10*3/uL (ref 1.4–7.7)
Neutrophils Relative %: 74.7 % (ref 43.0–77.0)
Platelets: 261 10*3/uL (ref 150.0–400.0)
RBC: 4.08 Mil/uL (ref 3.87–5.11)
RDW: 14.9 % (ref 11.5–15.5)
WBC: 9.2 10*3/uL (ref 4.0–10.5)

## 2021-11-08 LAB — COMPREHENSIVE METABOLIC PANEL
ALT: 9 U/L (ref 0–35)
AST: 12 U/L (ref 0–37)
Albumin: 4.5 g/dL (ref 3.5–5.2)
Alkaline Phosphatase: 64 U/L (ref 39–117)
BUN: 14 mg/dL (ref 6–23)
CO2: 33 mEq/L — ABNORMAL HIGH (ref 19–32)
Calcium: 10.6 mg/dL — ABNORMAL HIGH (ref 8.4–10.5)
Chloride: 97 mEq/L (ref 96–112)
Creatinine, Ser: 0.66 mg/dL (ref 0.40–1.20)
GFR: 88.55 mL/min (ref >60.00)
Glucose, Bld: 121 mg/dL — ABNORMAL HIGH (ref 70–99)
Potassium: 4.6 mEq/L (ref 3.5–5.1)
Sodium: 142 mEq/L (ref 135–145)
Total Bilirubin: 0.3 mg/dL (ref 0.2–1.2)
Total Protein: 7.5 g/dL (ref 6.0–8.3)

## 2021-11-08 LAB — TSH: TSH: 2.06 u[IU]/mL (ref 0.35–5.50)

## 2021-11-08 NOTE — Patient Instructions (Signed)
We will get some labs today including TSH, comprehensive metabolic panel, CBC, proBNP ?Schedule home sleep study for evaluation of sleep apnea ?Get high-res CT and PFTs in 3 months for evaluation of the lung ?Follow-up in clinic after the studies. ?

## 2021-11-08 NOTE — Progress Notes (Signed)
? ?      ?CHAKIA COUNTS    [id number]    11/26/1950 ? ?Primary Care Physician:Bedsole, Amy E, MD ? ?Referring Physician: [name], MD ?[address] ?[place],  [place] [address] ? ?Chief complaint: Follow-up for interstitial lung disease ? ?HPI: ?71 year old with history of biopsy-proven UIP pulmonary fibrosis.  Previously followed by Dr. [name] ?Surgical lung biopsy in December 2014 with findings showing UIP fibrosis.  There is strong suspicion for underlying connective tissue disease as she responds to prednisone. She has also been evaluated by Dr. [name] [name] at [facility]. ?Esbriet started in 2018 for progressive pulmonary fibrosis. ? ? ?She has had 2 episodes of diverticulitis in 2019 and 2020, E. coli UTI in 2020 and Campylobacter colitis in December 2020. We reduced her CellCept to 500 mg twice daily due to recurrent episodes of UTI however her breathing got worse and she is back now at 1 g twice daily with stabilization of symptoms ? ? ?Interim history: ?She got a dose of evusheld for COVID prophylaxis in April 2022 ? ?Continues on Esbriet and CellCept ?Continues to have dyspnea on exertion.  Does note some desats to low 80s on exertion ?Here for review of CT scan ? ?Outpatient Encounter Medications as of 11/08/2021  ?Medication Sig  ? acetaminophen (TYLENOL) 500 MG tablet Take 1,000 mg by mouth every 6 (six) hours as needed for moderate pain.  ? Ascorbic Acid (VITAMIN C PO) Take by mouth.  ? baclofen (LIORESAL) 10 MG tablet Take 1 tablet (10 mg total) by mouth 3 (three) times daily as needed.  ? FARXIGA 10 MG TABS tablet Take 1 tablet (10 mg total) by mouth daily.  ? FIBER PO Take 1 tablet by mouth daily.  ? fluticasone (FLONASE) 50 MCG/ACT nasal spray Place 1 spray into both nostrils daily.  ? hydrochlorothiazide (HYDRODIURIL) 12.5 MG tablet TAKE 1 TABLET DAILY ALONG WITH ONE OF THE LOSARTAN 50 MG TABLETS  ? levothyroxine (SYNTHROID) 125 MCG tablet TAKE 1 TABLET BY MOUTH DAILY  BEFORE BREAKFAST  ?  losartan (COZAAR) 50 MG tablet TAKE 1 TABLET DAILY ALONG WITH 1 OF THE HCTZ 12.5MG TABLETS  ? metFORMIN (GLUCOPHAGE-XR) 500 MG 24 hr tablet Take 4 tablets (2,000 mg total) by mouth daily with breakfast.  ? montelukast (SINGULAIR) 10 MG tablet Take 1 tablet (10 mg total) by mouth at bedtime.  ? mycophenolate (CELLCEPT) 500 MG tablet TAKE 2 TABLETS BY MOUTH  TWICE DAILY  ? NON FORMULARY Place 4 L into the nose daily. 10 Liters with exertion  ? nystatin cream (MYCOSTATIN) Apply 1 application topically 3 (three) times daily.  ? nystatin ointment (MYCOSTATIN) SMARTSIG:1 Topical Daily PRN  ? OneTouch Delica Lancets 27O MISC CHECK BLOOD SUGAR ONCE DAILY.  ? ONETOUCH ULTRA test strip CHECK BLOOD SUGAR DAILY.  ? pantoprazole (PROTONIX) 40 MG tablet TAKE 1 TABLET BY MOUTH TWICE  DAILY  ? pioglitazone (ACTOS) 45 MG tablet Take 1 tablet (45 mg total) by mouth daily.  ? Pirfenidone 801 MG TABS TAKE 1 TABLET (801MG) BY  MOUTH THREE TIMES DAILY  WITH FOOD  ? Probiotic Product (PROBIOTIC DAILY PO) Take by mouth.  ? rosuvastatin (CRESTOR) 10 MG tablet Take 1 tablet (10 mg total) by mouth daily.  ? verapamil (CALAN-SR) 180 MG CR tablet TAKE 1 TABLET BY MOUTH DAILY  ? ?No facility-administered encounter medications on file as of 11/08/2021.  ? ?Physical Exam: ?Blood pressure (!) 120/58, pulse 85, height '5\' 6"'  (1.676 m), weight 227  lb (103 kg), SpO2 100 %. ?Gen:      No acute distress ?HEENT:  EOMI, sclera anicteric ?Neck:     No masses; no thyromegaly ?Lungs:    Bibasal crackles ?CV:         Regular rate and rhythm; no murmurs ?Abd:      + bowel sounds; soft, non-tender; no palpable masses, no distension ?Ext:    No edema; adequate peripheral perfusion ?Skin:      Warm and dry; no rash ?Neuro: alert and oriented x 3 ?Psych: normal mood and affect  ? ?Data Reviewed: ?Imaging: ?11/2012 CT chest >>  centrilobular nodules, interlobular septal thickening worse in bases and periphery R lung > L; some GGO in bases and periphery as well, some  bronchiectasis in the bases R > L; findings suggestive of fibrosis but not UIP; question hypersensitivity pneumonitis given centrilobular nodules; also question aspiration ?  ?04/2013 Barium swallow> abnormal esophageal motility, GERD, hiatal hernia ?  ?04/2013 CT chest ([name])> findings suggestive of but not diagnostic of NSIP, small pulmonary nodule ? ?February 2016 CT chest high resolution> slight progression in reticular abnormalities consistent with pulmonary fibrosis, uip ? ?High-resolution CT chest 08/04/2019-basilar predominant fibrotic interstitial lung disease with mild honeycombing.  Mild progressions 2016.  UIP pattern ? ?High-resolution CT 03/09/2021-stable UIP pattern pulmonary fibrosis ?I have reviewed the images personally. ?  ?PFTs: ?04/28/2018 ?FVC 1.1 [35%], FEV1 1.02 [42%], F/F 92, DLCO 8.88 [36%] ?Severe restriction, diffusion impairment. With worsening compared to prior years. ? ?10/12/2019 ?FVC 1.13 [36%], FEV1 1.05 [44%], F/F 93, DLCO 8.76 [44%] ?Severe restriction, diffusion impairment.  Stable compared to 2019 ? ?09/28/2020 ?FVC 1.03 [33%], FEV1 0.97 [41%], DLCO 7.94 [40%] ?Severe restriction, diffusion impairment ? ?6 minute walk ?10/19/2012 walked 500 feet in office on room air oxygenation did not drop below 90% ?04/2013 6MW RA > 1100 feet, HR peak 109, O2 sat Nadir 87% ?11/2013 6MW 1364 feet, O2 saturation nadir 78% ?08/21/14 6MW 1400 feet 90% on 8L ? ?Labs: ?03/2013 ANA, ANCA, Anti-Jo-1, ESR, RF, SCL-70, anti-centromere, SSA/SSB, all negative; CRP 0.6;  ? ?07/17/2019-CBC within normal limits ?07/26/2020-hepatic panel within normal limits ? ?Assessment:  ?UIP fibrosis ?Presumed autoimmune component given response to immunosuppressive's in the past ?Currently maintained on CellCept 1 g twice daily, pirfenidone. ?Check CBC.  Hepatic panel in July 2022 was within normal limits ?Not on PJP prophylaxis due to allergies to sulfa drugs and atovaquone ? ?S/p Evusheild infusion to protect against Covid  infection ? ?PFTs reviewed with slightly worsened diffusion capacity though CT from last month is stable ? ?OSA ?Continue CPAP ?Download reviewed with good compliance. ? ?Plan/Recommendations: ?- Continue pirfenidone, CellCept 1 g twice daily ?- Continue supplemental oxygen ? ?Follow-up in 6 months. ? ?[name] MD ?[facility] ?11/08/2021, 12:20 PM ? ?CC: Bedsole, Amy E, MD ? ? ?

## 2021-11-08 NOTE — Progress Notes (Signed)
? ?      ?Kaitlin Hamilton    [id number]    1950/11/28 ? ?Primary Care Physician:Bedsole, Amy E, MD ? ?Referring Physician: [name], MD ?[address] ?[place],  [place] [address] ? ?Chief complaint: Follow-up for interstitial lung disease ? ?HPI: ?71 year old with history of biopsy-proven UIP pulmonary fibrosis.  Previously followed by Dr. [name] ?Surgical lung biopsy in December 2014 with findings showing UIP fibrosis.  There is strong suspicion for underlying connective tissue disease as she responds to prednisone. She has also been evaluated by Dr. [name] [name] at [facility]. ?Esbriet started in 2018 for progressive pulmonary fibrosis. ?She got a dose of evusheld for COVID prophylaxis in April 2022 ? ? ?She has had 2 episodes of diverticulitis in 2019 and 2020, E. coli UTI in 2020 and Campylobacter colitis in December 2020. We reduced her CellCept to 500 mg twice daily due to recurrent episodes of UTI however her breathing got worse and she is back now at 1 g twice daily with stabilization of symptoms ? ? ?Interim history: ?Continues on Esbriet and CellCept ?Reports gradually worsening dyspnea on exertion over the past year with increased fatigue ? ?She is intolerant of CPAP due to problems with the mask and wants to consider alternate therapies. ? ?Outpatient Encounter Medications as of 11/08/2021  ?Medication Sig  ? acetaminophen (TYLENOL) 500 MG tablet Take 1,000 mg by mouth every 6 (six) hours as needed for moderate pain.  ? Ascorbic Acid (VITAMIN C PO) Take by mouth.  ? baclofen (LIORESAL) 10 MG tablet Take 1 tablet (10 mg total) by mouth 3 (three) times daily as needed.  ? FARXIGA 10 MG TABS tablet Take 1 tablet (10 mg total) by mouth daily.  ? FIBER PO Take 1 tablet by mouth daily.  ? fluticasone (FLONASE) 50 MCG/ACT nasal spray Place 1 spray into both nostrils daily.  ? hydrochlorothiazide (HYDRODIURIL) 12.5 MG tablet TAKE 1 TABLET DAILY ALONG WITH ONE OF THE LOSARTAN 50 MG TABLETS  ? levothyroxine  (SYNTHROID) 125 MCG tablet TAKE 1 TABLET BY MOUTH DAILY  BEFORE BREAKFAST  ? losartan (COZAAR) 50 MG tablet TAKE 1 TABLET DAILY ALONG WITH 1 OF THE HCTZ 12.5MG TABLETS  ? metFORMIN (GLUCOPHAGE-XR) 500 MG 24 hr tablet Take 4 tablets (2,000 mg total) by mouth daily with breakfast.  ? montelukast (SINGULAIR) 10 MG tablet Take 1 tablet (10 mg total) by mouth at bedtime.  ? mycophenolate (CELLCEPT) 500 MG tablet TAKE 2 TABLETS BY MOUTH  TWICE DAILY  ? NON FORMULARY Place 4 L into the nose daily. 10 Liters with exertion  ? nystatin cream (MYCOSTATIN) Apply 1 application topically 3 (three) times daily.  ? nystatin ointment (MYCOSTATIN) SMARTSIG:1 Topical Daily PRN  ? OneTouch Delica Lancets 97L MISC CHECK BLOOD SUGAR ONCE DAILY.  ? ONETOUCH ULTRA test strip CHECK BLOOD SUGAR DAILY.  ? pantoprazole (PROTONIX) 40 MG tablet TAKE 1 TABLET BY MOUTH TWICE  DAILY  ? pioglitazone (ACTOS) 45 MG tablet Take 1 tablet (45 mg total) by mouth daily.  ? Pirfenidone 801 MG TABS TAKE 1 TABLET (801MG) BY  MOUTH THREE TIMES DAILY  WITH FOOD  ? Probiotic Product (PROBIOTIC DAILY PO) Take by mouth.  ? rosuvastatin (CRESTOR) 10 MG tablet Take 1 tablet (10 mg total) by mouth daily.  ? verapamil (CALAN-SR) 180 MG CR tablet TAKE 1 TABLET BY MOUTH DAILY  ? ?No facility-administered encounter medications on file as of 11/08/2021.  ? ?Physical Exam: ?Blood pressure 122/64, pulse 76,  height _0  (1.676 m), weight 213 lb 6.4 oz (96.8 kg), SpO2 98 %. ?Gen:      No acute distress ?HEENT:  EOMI, sclera anicteric ?Neck:     No masses; no thyromegaly ?Lungs:    Clear to auscultation bilaterally; normal respiratory effort ?CV:         Regular rate and rhythm; no murmurs ?Abd:      + bowel sounds; soft, non-tender; no palpable masses, no distension ?Ext:    No edema; adequate peripheral perfusion ?Skin:      Warm and dry; no rash ?Neuro: alert and oriented x 3 ?Psych: normal mood and affect  ? ?Data Reviewed: ?Imaging: ?11/2012 CT chest >>  centrilobular  nodules, interlobular septal thickening worse in bases and periphery R lung > L; some GGO in bases and periphery as well, some bronchiectasis in the bases R > L; findings suggestive of fibrosis but not UIP; question hypersensitivity pneumonitis given centrilobular nodules; also question aspiration ?  ?04/2013 Barium swallow> abnormal esophageal motility, GERD, hiatal hernia ?  ?04/2013 CT chest ([name])> findings suggestive of but not diagnostic of NSIP, small pulmonary nodule ? ?February 2016 CT chest high resolution> slight progression in reticular abnormalities consistent with pulmonary fibrosis, uip ? ?High-resolution CT chest 08/04/2019-basilar predominant fibrotic interstitial lung disease with mild honeycombing.  Mild progressions 2016.  UIP pattern ? ?High-resolution CT 03/09/2021-stable UIP pattern pulmonary fibrosis ?I have reviewed the images personally. ?  ?PFTs: ?04/28/2018 ?FVC 1.1 [35%], FEV1 1.02 [42%], F/F 92, DLCO 8.88 [36%] ?Severe restriction, diffusion impairment. With worsening compared to prior years. ? ?10/12/2019 ?FVC 1.13 [36%], FEV1 1.05 [44%], F/F 93, DLCO 8.76 [44%] ?Severe restriction, diffusion impairment.  Stable compared to 2019 ? ?09/28/2020 ?FVC 1.03 [33%], FEV1 0.97 [41%], DLCO 7.94 [40%] ?Severe restriction, diffusion impairment ? ?6 minute walk ?10/19/2012 walked 500 feet in office on room air oxygenation did not drop below 90% ?04/2013 6MW RA > 1100 feet, HR peak 109, O2 sat Nadir 87% ?11/2013 6MW 1364 feet, O2 saturation nadir 78% ?08/21/14 6MW 1400 feet 90% on 8L ? ?Labs: ?03/2013 ANA, ANCA, Anti-Jo-1, ESR, RF, SCL-70, anti-centromere, SSA/SSB, all negative; CRP 0.6;  ? ?07/17/2019-CBC within normal limits ?07/26/2020-hepatic panel within normal limits ? ?Assessment:  ?UIP fibrosis ?Presumed autoimmune component given response to immunosuppressive's in the past ?Currently maintained on CellCept 1 g twice daily, pirfenidone. ?Not on PJP prophylaxis due to allergies to sulfa drugs and  atovaquone ? ?Notes worsening dyspnea and fatigue.  We will check labs today for monitoring including metabolic panel, CBC, proBNP and TSH ?Schedule high-res CT and PFTs and follow-up in clinic ? ?OSA ?Continue CPAP ?Wants to look at alternative therapies as she wants to stop using the mask. Discussed inspire device or oral appliance but she does not want to use these.   ? ?We will get a home sleep study to reevaluate as last test was in 2015 and she is lost a lot of weight since then. ? ?Plan/Recommendations: ?- Continue pirfenidone, CellCept 1 g twice daily ?- Continue supplemental oxygen ?- Check labs ?- Home sleep study ? ? ?[name] MD ?[facility] ?11/08/2021, 12:21 PM ? ?CC: Bedsole, Amy E, MD ? ? ?

## 2021-11-09 LAB — PRO B NATRIURETIC PEPTIDE: NT-Pro BNP: 49 pg/mL (ref 0–301)

## 2021-11-13 ENCOUNTER — Ambulatory Visit
Admission: RE | Admit: 2021-11-13 | Discharge: 2021-11-13 | Disposition: A | Discharge status: Home / Self Care | Payer: Medicare Other | Source: Ambulatory Visit | Attending: Family Medicine | Admitting: Family Medicine

## 2021-11-13 DIAGNOSIS — N63 Unspecified lump in unspecified breast: Secondary | ICD-10-CM | POA: Diagnosis not present

## 2021-11-13 DIAGNOSIS — R928 Other abnormal and inconclusive findings on diagnostic imaging of breast: Secondary | ICD-10-CM | POA: Diagnosis not present

## 2021-11-13 DIAGNOSIS — N641 Fat necrosis of breast: Secondary | ICD-10-CM | POA: Diagnosis not present

## 2021-11-15 ENCOUNTER — Telehealth: Payer: Self-pay | Admitting: Pulmonary Disease

## 2021-11-15 DIAGNOSIS — G4733 Obstructive sleep apnea (adult) (pediatric): Secondary | ICD-10-CM

## 2021-11-15 NOTE — Telephone Encounter (Signed)
In lab sleep study ordered for patient.  Nothing further at this time. ?

## 2021-11-17 DIAGNOSIS — E785 Hyperlipidemia, unspecified: Secondary | ICD-10-CM | POA: Diagnosis not present

## 2021-11-17 DIAGNOSIS — Z7984 Long term (current) use of oral hypoglycemic drugs: Secondary | ICD-10-CM

## 2021-11-17 DIAGNOSIS — E1169 Type 2 diabetes mellitus with other specified complication: Secondary | ICD-10-CM | POA: Diagnosis not present

## 2021-11-17 DIAGNOSIS — I152 Hypertension secondary to endocrine disorders: Secondary | ICD-10-CM | POA: Diagnosis not present

## 2021-11-17 DIAGNOSIS — E1159 Type 2 diabetes mellitus with other circulatory complications: Secondary | ICD-10-CM | POA: Diagnosis not present

## 2021-11-19 NOTE — Telephone Encounter (Signed)
I scheduled pt for inlab study.  I called her to give her appt info and she stated she talked to her daughter and she decided she does not want sleep study.  She is going to continue with cpap the way she has been doing and will wait til she follows up with PM in a few months and has CT before changing anything.  I told her I would make PM aware and I cancelled sleep study appt.   ?

## 2021-11-22 ENCOUNTER — Encounter: Payer: Self-pay | Admitting: Pulmonary Disease

## 2021-11-22 NOTE — Telephone Encounter (Signed)
Received the following message from patient's daughter [name]:  ? ?"Good morning. My mother just wanted me to send a quick message for a rationale as to why she will not be partaking in the sleep study. When they called to schedule the appointment, not only was the scheduler rude but she also was clear that they refuse to do a sleep study in the home, stated that the test would not be valid unless she slept a full 6 hours (which she has not been able to do for many years at this point), and that she under no circumstances could wear her oxygen. The last reason in and of itself is completely perplexing and obviously not sustainable for even 10 minutes.  She will continue her current plan of trying to wear her CPAP as much as she can at night.  ?Kind regards,  ?[name]" ? ?Will send to Dr. Vaughan Browner to let him know.  ?

## 2021-11-25 ENCOUNTER — Other Ambulatory Visit: Payer: Self-pay | Admitting: Family Medicine

## 2021-11-25 NOTE — Patient Instructions (Addendum)
Thanks for the update.  I am sorry had to go through this.   ?We will discuss in person at time of return visit. ? ?

## 2021-11-26 ENCOUNTER — Telehealth: Payer: Self-pay

## 2021-11-26 NOTE — Progress Notes (Signed)
Chronic Care Management Pharmacy Assistant   Name: Kaitlin Hamilton  MRN: [id number] DOB: 1950/11/23  Reason for Encounter: CCM (General Adherence)   Recent office visits:  None since last CCM contact  Recent consult visits:  11/13/21 Mammogram 11/08/21 [name], MD (Pulmonology) Ordered: Home sleep study - Patient since cancelled sleep study  Hospital visits:  None in previous 6 months  Medications: Outpatient Encounter Medications as of 11/26/2021  Medication Sig   acetaminophen (TYLENOL) 500 MG tablet Take 1,000 mg by mouth every 6 (six) hours as needed for moderate pain.   Ascorbic Acid (VITAMIN C PO) Take by mouth.   baclofen (LIORESAL) 10 MG tablet Take 1 tablet (10 mg total) by mouth 3 (three) times daily as needed.   FARXIGA 10 MG TABS tablet Take 1 tablet (10 mg total) by mouth daily.   FIBER PO Take 1 tablet by mouth daily.   fluticasone (FLONASE) 50 MCG/ACT nasal spray Place 1 spray into both nostrils daily.   hydrochlorothiazide (HYDRODIURIL) 12.5 MG tablet TAKE 1 TABLET DAILY ALONG WITH ONE OF THE LOSARTAN 50 MG TABLETS   levothyroxine (SYNTHROID) 125 MCG tablet TAKE 1 TABLET BY MOUTH DAILY  BEFORE BREAKFAST   losartan (COZAAR) 50 MG tablet TAKE 1 TABLET DAILY ALONG WITH 1 OF THE HCTZ 12.'5MG'$  TABLETS   metFORMIN (GLUCOPHAGE-XR) 500 MG 24 hr tablet Take 4 tablets (2,000 mg total) by mouth daily with breakfast.   montelukast (SINGULAIR) 10 MG tablet Take 1 tablet (10 mg total) by mouth at bedtime.   mycophenolate (CELLCEPT) 500 MG tablet TAKE 2 TABLETS BY MOUTH  TWICE DAILY   NON FORMULARY Place 4 L into the nose daily. 10 Liters with exertion   nystatin cream (MYCOSTATIN) Apply 1 application topically 3 (three) times daily.   nystatin ointment (MYCOSTATIN) SMARTSIG:1 Topical Daily PRN   OneTouch Delica Lancets 98P MISC CHECK BLOOD SUGAR ONCE DAILY.   ONETOUCH ULTRA test strip CHECK BLOOD SUGAR DAILY.   pantoprazole (PROTONIX) 40 MG tablet TAKE 1 TABLET BY MOUTH  TWICE  DAILY   pioglitazone (ACTOS) 45 MG tablet Take 1 tablet (45 mg total) by mouth daily.   Pirfenidone 801 MG TABS TAKE 1 TABLET ('801MG'$ ) BY  MOUTH THREE TIMES DAILY  WITH FOOD   Probiotic Product (PROBIOTIC DAILY PO) Take by mouth.   rosuvastatin (CRESTOR) 10 MG tablet Take 1 tablet (10 mg total) by mouth daily.   verapamil (CALAN-SR) 180 MG CR tablet TAKE 1 TABLET BY MOUTH DAILY   No facility-administered encounter medications on file as of 11/26/2021.    Contacted Kaitlin Hamilton on 11/26/2021 for general disease state and medication adherence call.   Patient is more than 5 days past due for refill on the following medications per chart history: Pravastatin 40 mg   [facility] Medications: Medication Name/mg Last Fill Days Supply Rosuvastatin 10 mg  10/30/2021 90 Pravastatin 40 mg        08/13/2021      90 Losartan 50 mg            09/10/2021      90         Metfromin 500 mg         09/12/2021      90 Farxiga 10 mg              10/08/2021      90 Fill dates verified with [facility]   What concerns do you have about your medications? Patient does  not have any concerns with her medications. She has been taking Rosuvastatin 10 mg and is not having any problems or side effects with it. Patient stated everything is going good.  The patient denies side effects with their medications.   How often do you forget or accidentally miss a dose? Never  Do you use a pillbox? Yes  Are you having any problems getting your medications from your pharmacy? No  Has the cost of your medications been a concern? No  Since last visit with CPP, interventions have been made. Start Rosuvastatin 10 mg.  The patient has not had an ED visit since last contact.   The patient denies problems with their health.   Patient denies concerns or questions for Kaitlin Hamilton, PharmD at this time.   Care Gaps: Annual wellness visit in last year? Yes 09/05/2021 Most Recent BP reading: 122/64 on 11/08/2021  If  Diabetic: Most recent A1C reading: 6.6 on 09/05/2021 Last eye exam / retinopathy screening: 04/17/2020 Last diabetic foot exam: Up to date  Upcoming appointments: DEXA appointment on 12/23/2021 CCM appointment on 04/29/2022  Kaitlin Hamilton, CPP notified  Kaitlin Hamilton, [place] Assistant [phone]

## 2021-12-02 DIAGNOSIS — J8489 Other specified interstitial pulmonary diseases: Secondary | ICD-10-CM | POA: Diagnosis not present

## 2021-12-02 DIAGNOSIS — G4733 Obstructive sleep apnea (adult) (pediatric): Secondary | ICD-10-CM | POA: Diagnosis not present

## 2021-12-09 ENCOUNTER — Other Ambulatory Visit: Payer: Self-pay | Admitting: Family Medicine

## 2021-12-09 NOTE — Telephone Encounter (Signed)
Last office visit 09/05/2021 for [place].  Last refilled 11/01/2021 for #30 with no refills.  Next Appt: 03/06/2022 for DM.

## 2021-12-16 ENCOUNTER — Other Ambulatory Visit: Payer: Self-pay | Admitting: Family Medicine

## 2021-12-16 NOTE — Telephone Encounter (Signed)
Last office visit 09/05/21 for [place].  Last refilled 12/09/21 for #30 with no refills.  Next Appt: 03/06/22 for DM.

## 2021-12-18 ENCOUNTER — Other Ambulatory Visit: Payer: Self-pay | Admitting: Family Medicine

## 2021-12-23 ENCOUNTER — Ambulatory Visit
Admission: RE | Admit: 2021-12-23 | Discharge: 2021-12-23 | Disposition: A | Discharge status: Home / Self Care | Payer: Medicare Other | Source: Ambulatory Visit | Attending: Family Medicine | Admitting: Family Medicine

## 2021-12-23 ENCOUNTER — Encounter (HOSPITAL_BASED_OUTPATIENT_CLINIC_OR_DEPARTMENT_OTHER): Payer: Medicare Other | Admitting: Pulmonary Disease

## 2021-12-23 DIAGNOSIS — Z780 Asymptomatic menopausal state: Secondary | ICD-10-CM | POA: Diagnosis not present

## 2021-12-23 DIAGNOSIS — E2839 Other primary ovarian failure: Secondary | ICD-10-CM | POA: Diagnosis not present

## 2022-01-02 DIAGNOSIS — G4733 Obstructive sleep apnea (adult) (pediatric): Secondary | ICD-10-CM | POA: Diagnosis not present

## 2022-01-02 DIAGNOSIS — J8489 Other specified interstitial pulmonary diseases: Secondary | ICD-10-CM | POA: Diagnosis not present

## 2022-01-22 DIAGNOSIS — G4733 Obstructive sleep apnea (adult) (pediatric): Secondary | ICD-10-CM | POA: Diagnosis not present

## 2022-02-01 DIAGNOSIS — J8489 Other specified interstitial pulmonary diseases: Secondary | ICD-10-CM | POA: Diagnosis not present

## 2022-02-01 DIAGNOSIS — G4733 Obstructive sleep apnea (adult) (pediatric): Secondary | ICD-10-CM | POA: Diagnosis not present

## 2022-02-04 ENCOUNTER — Telehealth: Payer: Self-pay | Admitting: Pulmonary Disease

## 2022-02-04 DIAGNOSIS — J849 Interstitial pulmonary disease, unspecified: Secondary | ICD-10-CM

## 2022-02-04 NOTE — Telephone Encounter (Signed)
Refill sent for PIRFENIDONE to [facility]: [phone]   Dose: 801 mg three times daily  Last OV: 11/08/21 Provider: Dr. Vaughan Browner  Next OV: 02/14/22  CMET on 11/08/21. Will send subsequent refills after appt on 02/14/22  Knox Saliva, PharmD, MPH, BCPS Clinical Pharmacist (Rheumatology and Pulmonology)

## 2022-02-07 ENCOUNTER — Ambulatory Visit
Admission: RE | Admit: 2022-02-07 | Discharge: 2022-02-07 | Disposition: A | Discharge status: Home / Self Care | Payer: Medicare Other | Source: Ambulatory Visit | Attending: Family Medicine | Admitting: Family Medicine

## 2022-02-07 DIAGNOSIS — J479 Bronchiectasis, uncomplicated: Secondary | ICD-10-CM | POA: Insufficient documentation

## 2022-02-07 DIAGNOSIS — K449 Diaphragmatic hernia without obstruction or gangrene: Secondary | ICD-10-CM | POA: Insufficient documentation

## 2022-02-07 DIAGNOSIS — J84112 Idiopathic pulmonary fibrosis: Secondary | ICD-10-CM | POA: Diagnosis not present

## 2022-02-07 DIAGNOSIS — J8410 Pulmonary fibrosis, unspecified: Secondary | ICD-10-CM | POA: Diagnosis not present

## 2022-02-07 DIAGNOSIS — I517 Cardiomegaly: Secondary | ICD-10-CM | POA: Insufficient documentation

## 2022-02-12 ENCOUNTER — Other Ambulatory Visit: Payer: Self-pay | Admitting: Family Medicine

## 2022-02-12 NOTE — Telephone Encounter (Signed)
Last office visit 09/05/21 for [place].  Last refilled Baclofen 12/17/21 for #30 with no refills.  Nystatin 11/05/21 for 30 g with no refills.  Next Appt: 03/06/22 for DM.

## 2022-02-13 ENCOUNTER — Other Ambulatory Visit: Payer: Self-pay | Admitting: *Deleted

## 2022-02-13 DIAGNOSIS — J849 Interstitial pulmonary disease, unspecified: Secondary | ICD-10-CM

## 2022-02-14 ENCOUNTER — Encounter: Payer: Self-pay | Admitting: Pulmonary Disease

## 2022-02-14 ENCOUNTER — Other Ambulatory Visit: Payer: Self-pay | Admitting: *Deleted

## 2022-02-14 ENCOUNTER — Ambulatory Visit: Payer: Medicare Other | Admitting: Pulmonary Disease

## 2022-02-14 ENCOUNTER — Ambulatory Visit (INDEPENDENT_AMBULATORY_CARE_PROVIDER_SITE_OTHER): Payer: Medicare Other | Admitting: Pulmonary Disease

## 2022-02-14 VITALS — BP 128/62 | HR 88 | Temp 97.6°F | Ht 64.0 in | Wt 212.0 lb

## 2022-02-14 DIAGNOSIS — J9611 Chronic respiratory failure with hypoxia: Secondary | ICD-10-CM

## 2022-02-14 DIAGNOSIS — Z5181 Encounter for therapeutic drug level monitoring: Secondary | ICD-10-CM | POA: Diagnosis not present

## 2022-02-14 DIAGNOSIS — J849 Interstitial pulmonary disease, unspecified: Secondary | ICD-10-CM

## 2022-02-14 DIAGNOSIS — R0602 Shortness of breath: Secondary | ICD-10-CM | POA: Diagnosis not present

## 2022-02-14 LAB — CBC WITH DIFFERENTIAL/PLATELET
Basophils Absolute: 0 10*3/uL (ref 0.0–0.1)
Basophils Relative: 0.5 % (ref 0.0–3.0)
Eosinophils Absolute: 0.1 10*3/uL (ref 0.0–0.7)
Eosinophils Relative: 1.2 % (ref 0.0–5.0)
HCT: 39.9 % (ref 36.0–46.0)
Hemoglobin: 12.5 g/dL (ref 12.0–15.0)
Lymphocytes Relative: 15.2 % (ref 12.0–46.0)
Lymphs Abs: 1.3 10*3/uL (ref 0.7–4.0)
MCHC: 31.2 g/dL (ref 30.0–36.0)
MCV: 95.1 fl (ref 78.0–100.0)
Monocytes Absolute: 0.6 10*3/uL (ref 0.1–1.0)
Monocytes Relative: 6.7 % (ref 3.0–12.0)
Neutro Abs: 6.3 10*3/uL (ref 1.4–7.7)
Neutrophils Relative %: 76.4 % (ref 43.0–77.0)
Platelets: 266 10*3/uL (ref 150.0–400.0)
RBC: 4.2 Mil/uL (ref 3.87–5.11)
RDW: 14.5 % (ref 11.5–15.5)
WBC: 8.3 10*3/uL (ref 4.0–10.5)

## 2022-02-14 LAB — COMPREHENSIVE METABOLIC PANEL WITH GFR
ALT: 9 U/L (ref 0–35)
AST: 12 U/L (ref 0–37)
Albumin: 4.6 g/dL (ref 3.5–5.2)
Alkaline Phosphatase: 61 U/L (ref 39–117)
BUN: 17 mg/dL (ref 6–23)
CO2: 34 meq/L — ABNORMAL HIGH (ref 19–32)
Calcium: 10.6 mg/dL — ABNORMAL HIGH (ref 8.4–10.5)
Chloride: 97 meq/L (ref 96–112)
Creatinine, Ser: 0.72 mg/dL (ref 0.40–1.20)
GFR: 84.24 mL/min (ref >60.00)
Glucose, Bld: 131 mg/dL — ABNORMAL HIGH (ref 70–99)
Potassium: 4.5 meq/L (ref 3.5–5.1)
Sodium: 142 meq/L (ref 135–145)
Total Bilirubin: 0.3 mg/dL (ref 0.2–1.2)
Total Protein: 7.9 g/dL (ref 6.0–8.3)

## 2022-02-14 MED ORDER — PREDNISONE 20 MG PO TABS: ORAL_TABLET | ORAL | 3 refills | Status: DC

## 2022-02-14 MED ORDER — MYCOPHENOLATE MOFETIL 500 MG PO TABS: ORAL_TABLET | ORAL | 5 refills | Status: DC

## 2022-02-14 NOTE — Patient Instructions (Signed)
Full PFT performed today. °

## 2022-02-14 NOTE — Progress Notes (Signed)
AZA DANTES    [id number]    [birth year]  Primary Care Physician:Bedsole, Mervyn Gay, MD  Referring Physician: [name], MD [place],  [place] [address]  Chief complaint: Follow-up for interstitial lung disease  HPI: 71 year old with history of biopsy-proven UIP pulmonary fibrosis.  Previously followed by Dr. [name] Surgical lung biopsy in December 2014 with findings showing UIP fibrosis.  There is strong suspicion for underlying connective tissue disease as she responds to prednisone. She has also been evaluated by Dr. [name] [name] at [facility]. Esbriet started in 2018 for progressive pulmonary fibrosis. She got a dose of evusheld for COVID prophylaxis in April 2022  She has had 2 episodes of diverticulitis in 2019 and 2020, E. coli UTI in 2020 and Campylobacter colitis in December 2020. We reduced her CellCept to 500 mg twice daily due to recurrent episodes of UTI however her breathing got worse and she is back now at 1 g twice daily with stabilization of symptoms  Interim history: Continues on Esbriet and CellCept Reports gradually worsening dyspnea on exertion over the past year with increased fatigue.  She is tearful in the office today about her inability to do the things she loves such as cooking and gardening  We had ordered home sleep study at last visit but patient did not want to go through that as it required taking the test on room air  Outpatient Encounter Medications as of 02/14/2022  Medication Sig   acetaminophen (TYLENOL) 500 MG tablet Take 1,000 mg by mouth every 6 (six) hours as needed for moderate pain.   Ascorbic Acid (VITAMIN C PO) Take by mouth.   baclofen (LIORESAL) 10 MG tablet Take 1 tablet (10 mg total) by mouth 3 (three) times daily as needed.   FARXIGA 10 MG TABS tablet Take 1 tablet (10 mg total) by mouth daily.   FIBER PO Take 1 tablet by mouth daily.   fluticasone (FLONASE) 50 MCG/ACT nasal spray Place 1 spray into both  nostrils daily.   hydrochlorothiazide (HYDRODIURIL) 12.5 MG tablet TAKE 1 TABLET DAILY ALONG WITH ONE OF THE LOSARTAN 50 MG TABLETS   levothyroxine (SYNTHROID) 125 MCG tablet TAKE 1 TABLET BY MOUTH DAILY  BEFORE BREAKFAST   losartan (COZAAR) 50 MG tablet TAKE 1 TABLET DAILY ALONG WITH 1 OF THE HCTZ 12.5MG TABLETS   metFORMIN (GLUCOPHAGE-XR) 500 MG 24 hr tablet Take 4 tablets (2,000 mg total) by mouth daily with breakfast.   montelukast (SINGULAIR) 10 MG tablet Take 1 tablet (10 mg total) by mouth at bedtime.   mycophenolate (CELLCEPT) 500 MG tablet TAKE 2 TABLETS BY MOUTH  TWICE DAILY   NON FORMULARY Place 4 L into the nose daily. 10 Liters with exertion   nystatin cream (MYCOSTATIN) Apply 1 application topically 3 (three) times daily.   OneTouch Delica Lancets 07H MISC CHECK BLOOD SUGAR ONCE DAILY.   ONETOUCH ULTRA test strip CHECK BLOOD SUGAR DAILY.   pantoprazole (PROTONIX) 40 MG tablet TAKE 1 TABLET BY MOUTH TWICE  DAILY   pioglitazone (ACTOS) 45 MG tablet Take 1 tablet (45 mg total) by mouth daily.   Pirfenidone 801 MG TABS TAKE 1 TABLET (801MG) BY MOUTH  THREE TIMES DAILY WITH FOOD   Probiotic Product (PROBIOTIC DAILY PO) Take by mouth.   rosuvastatin (CRESTOR) 10 MG tablet Take 1 tablet (10 mg total) by mouth daily.   verapamil (CALAN-SR) 180 MG CR tablet TAKE 1 TABLET BY MOUTH DAILY  No facility-administered encounter medications on file as of 02/14/2022.   Physical Exam: Blood pressure 128/62, pulse 88, temperature 97.6 F (36.4 C), temperature source Oral, height 5' 4" (1.626 m), weight 212 lb (96.2 kg), SpO2 96 %. Gen:      No acute distress HEENT:  EOMI, sclera anicteric Neck:     No masses; no thyromegaly Lungs:    Clear to auscultation bilaterally; normal respiratory effort CV:         Regular rate and rhythm; no murmurs Abd:      + bowel sounds; soft, non-tender; no palpable masses, no distension Ext:    No edema; adequate peripheral perfusion Skin:      Warm and dry; no  rash Neuro: alert and oriented x 3 Psych: normal mood and affect   Data Reviewed: Imaging: 11/2012 CT chest >>  centrilobular nodules, interlobular septal thickening worse in bases and periphery R lung > L; some GGO in bases and periphery as well, some bronchiectasis in the bases R > L; findings suggestive of fibrosis but not UIP; question hypersensitivity pneumonitis given centrilobular nodules; also question aspiration   04/2013 Barium swallow> abnormal esophageal motility, GERD, hiatal hernia   04/2013 CT chest ([name])> findings suggestive of but not diagnostic of NSIP, small pulmonary nodule  February 2016 CT chest high resolution> slight progression in reticular abnormalities consistent with pulmonary fibrosis, uip  High-resolution CT chest 08/04/2019-basilar predominant fibrotic interstitial lung disease with mild honeycombing.  Mild progressions 2016.  UIP pattern  High-resolution CT 03/09/2021-stable UIP pattern pulmonary fibrosis  CT high-resolution 02/07/2022-UIP pattern pulmonary fibrosis stable compared to 2022 however overall progressed since 2016. I reviewed the images personally   PFTs: 04/28/2018 FVC 1.1 [35%], FEV1 1.02 [42%], F/F 92, DLCO 8.88 [36%] Severe restriction, diffusion impairment. With worsening compared to prior years.  10/12/2019 FVC 1.13 [36%], FEV1 1.05 [44%], F/F 93, DLCO 8.76 [44%] Severe restriction, diffusion impairment.  Stable compared to 2019  09/28/2020 FVC 1.03 [33%], FEV1 0.97 [41%], DLCO 7.94 [40%] Severe restriction, diffusion impairment  02/14/2022 FVC 0.99 [33%], FEV1 0.95 [41%], F/F 96, TLC 2.12 [41%], DLCO 6.87 [35%] Severe restriction, diffusion impairment.  Worsening compared to 2022  6 minute walk 10/19/2012 walked 500 feet in office on room air oxygenation did not drop below 90% 04/2013 6MW RA > 1100 feet, HR peak 109, O2 sat Nadir 87% 11/2013 6MW 1364 feet, O2 saturation nadir 78% 08/21/14 6MW 1400 feet 90% on 8L  Labs: 03/2013  ANA, ANCA, Anti-Jo-1, ESR, RF, SCL-70, anti-centromere, SSA/SSB, all negative; CRP 0.6;   07/17/2019-CBC within normal limits 07/26/2020-hepatic panel within normal limits  Assessment:  UIP fibrosis Presumed autoimmune component given response to immunosuppressive's in the past Currently maintained on CellCept 1 g twice daily, pirfenidone. Not on PJP prophylaxis due to allergies to sulfa drugs and atovaquone  Notes worsening dyspnea and fatigue.  Although CT is stable for the past 1 year clinically she is worse and PFTs show worsening restriction and diffusion impairment.  We will recheck labs today including comprehensive metabolic panel, CBC and proBNP Order echocardiogram for evaluation of pulmonary hypertension though proBNP's in the past have been normal. I will increase the CellCept to max dose of 1.5 g twice daily and add prednisone at 20 mg to see if there is any reversible inflammatory component to her decline  Unfortunately this may be just worsening of her fibrosis which in fact has been progressive since 2016.  We will reassess in 2 months.  She may be candidate  for clinical trials.  OSA Continue CPAP  Plan/Recommendations: - Continue pirfenidone - Increase CellCept dose to 1.5 g twice daily, start prednisone - Check labs, echocardiogram      MD [facility] 02/14/2022, 11:10 AM  CC: Bedsole, Amy E, MD  

## 2022-02-14 NOTE — Patient Instructions (Signed)
We will check some labs today including comprehensive metabolic panel, CBC and proBNP for dyspnea Start prednisone at 20 mg Increase CellCept to 1.5 g twice daily Order echocardiogram Follow-up in 2 months with video visit.

## 2022-02-14 NOTE — Progress Notes (Signed)
Full PFT performed today. °

## 2022-02-15 LAB — PRO B NATRIURETIC PEPTIDE: NT-Pro BNP: 52 pg/mL (ref 0–301)

## 2022-02-17 LAB — PULMONARY FUNCTION TEST
DL/VA % pred: 83 %
DL/VA: 3.45 ml/min/mmHg/L
DLCO cor % pred: 35 %
DLCO cor: 6.87 ml/min/mmHg
DLCO unc % pred: 35 %
DLCO unc: 6.87 ml/min/mmHg
FEF 25-75 Post: 2.2 L/sec
FEF 25-75 Pre: 2.24 L/sec
FEF2575-%Change-Post: -1 %
FEF2575-%Pred-Post: 117 %
FEF2575-%Pred-Pre: 119 %
FEV1-%Change-Post: 7 %
FEV1-%Pred-Post: 41 %
FEV1-%Pred-Pre: 38 %
FEV1-Post: 0.95 L
FEV1-Pre: 0.88 L
FEV1FVC-%Change-Post: 0 %
FEV1FVC-%Pred-Pre: 125 %
FEV6-%Change-Post: 6 %
FEV6-%Pred-Post: 34 %
FEV6-%Pred-Pre: 32 %
FEV6-Post: 0.99 L
FEV6-Pre: 0.93 L
FEV6FVC-%Pred-Post: 104 %
FEV6FVC-%Pred-Pre: 104 %
FVC-%Change-Post: 6 %
FVC-%Pred-Post: 33 %
FVC-%Pred-Pre: 30 %
FVC-Post: 0.99 L
FVC-Pre: 0.93 L
Post FEV1/FVC ratio: 96 %
Post FEV6/FVC ratio: 100 %
Pre FEV1/FVC ratio: 95 %
Pre FEV6/FVC Ratio: 100 %
RV % pred: 42 %
RV: 0.95 L
TLC % pred: 41 %
TLC: 2.12 L

## 2022-02-17 MED ORDER — PIRFENIDONE 801 MG PO TABS: ORAL_TABLET | ORAL | 1 refills | Status: DC

## 2022-02-17 NOTE — Telephone Encounter (Signed)
Refill for pirfenidone sent to [facility]. CMET stable on 02/14/22  Knox Saliva, PharmD, MPH, BCPS, CPP Clinical Pharmacist (Rheumatology and Pulmonology)

## 2022-02-17 NOTE — Addendum Note (Signed)
Addended by: Cassandria Anger on: 02/17/2022 01:06 PM   Modules accepted: Orders

## 2022-02-24 ENCOUNTER — Other Ambulatory Visit: Payer: Self-pay | Admitting: Family Medicine

## 2022-02-24 DIAGNOSIS — J3089 Other allergic rhinitis: Secondary | ICD-10-CM

## 2022-02-26 ENCOUNTER — Telehealth: Payer: Self-pay | Admitting: Family Medicine

## 2022-02-26 DIAGNOSIS — E039 Hypothyroidism, unspecified: Secondary | ICD-10-CM

## 2022-02-26 DIAGNOSIS — E118 Type 2 diabetes mellitus with unspecified complications: Secondary | ICD-10-CM

## 2022-02-26 DIAGNOSIS — E1169 Type 2 diabetes mellitus with other specified complication: Secondary | ICD-10-CM

## 2022-02-26 NOTE — Telephone Encounter (Signed)
-----   Message from Ellamae Sia sent at 02/14/2022  3:37 PM EDT ----- Regarding: Lab orders for Thursday, 8.10.23 Lab orders for a 6 month follow up appt

## 2022-02-27 ENCOUNTER — Other Ambulatory Visit (INDEPENDENT_AMBULATORY_CARE_PROVIDER_SITE_OTHER): Payer: Medicare Other

## 2022-02-27 DIAGNOSIS — E118 Type 2 diabetes mellitus with unspecified complications: Secondary | ICD-10-CM | POA: Diagnosis not present

## 2022-02-27 LAB — COMPREHENSIVE METABOLIC PANEL
ALT: 10 U/L (ref 0–35)
AST: 13 U/L (ref 0–37)
Albumin: 4.7 g/dL (ref 3.5–5.2)
Alkaline Phosphatase: 63 U/L (ref 39–117)
BUN: 17 mg/dL (ref 6–23)
CO2: 35 mEq/L — ABNORMAL HIGH (ref 19–32)
Calcium: 10.9 mg/dL — ABNORMAL HIGH (ref 8.4–10.5)
Chloride: 96 mEq/L (ref 96–112)
Creatinine, Ser: 0.62 mg/dL (ref 0.40–1.20)
GFR: 89.7 mL/min (ref >60.00)
Glucose, Bld: 159 mg/dL — ABNORMAL HIGH (ref 70–99)
Potassium: 4.1 mEq/L (ref 3.5–5.1)
Sodium: 136 mEq/L (ref 135–145)
Total Bilirubin: 0.4 mg/dL (ref 0.2–1.2)
Total Protein: 7.8 g/dL (ref 6.0–8.3)

## 2022-02-27 LAB — LIPID PANEL
Cholesterol: 215 mg/dL — ABNORMAL HIGH (ref 0–200)
HDL: 106.6 mg/dL (ref >39.00)
LDL Cholesterol: 73 mg/dL (ref 0–99)
NonHDL: 108.39
Total CHOL/HDL Ratio: 2
Triglycerides: 178 mg/dL — ABNORMAL HIGH (ref 0.0–149.0)
VLDL: 35.6 mg/dL (ref 0.0–40.0)

## 2022-02-27 LAB — HEMOGLOBIN A1C: Hgb A1c MFr Bld: 6.9 % — ABNORMAL HIGH (ref 4.6–6.5)

## 2022-02-28 NOTE — Progress Notes (Signed)
No critical labs need to be addressed urgently. We will discuss labs in detail at upcoming office visit.   

## 2022-03-03 ENCOUNTER — Other Ambulatory Visit: Payer: Self-pay | Admitting: Family Medicine

## 2022-03-04 DIAGNOSIS — G4733 Obstructive sleep apnea (adult) (pediatric): Secondary | ICD-10-CM | POA: Diagnosis not present

## 2022-03-04 DIAGNOSIS — J8489 Other specified interstitial pulmonary diseases: Secondary | ICD-10-CM | POA: Diagnosis not present

## 2022-03-06 ENCOUNTER — Encounter: Payer: Self-pay | Admitting: Family Medicine

## 2022-03-06 ENCOUNTER — Ambulatory Visit (INDEPENDENT_AMBULATORY_CARE_PROVIDER_SITE_OTHER): Payer: Medicare Other | Admitting: Family Medicine

## 2022-03-06 VITALS — BP 128/66 | HR 87 | Temp 97.8°F | Ht 65.5 in | Wt 217.2 lb

## 2022-03-06 DIAGNOSIS — E1159 Type 2 diabetes mellitus with other circulatory complications: Secondary | ICD-10-CM | POA: Diagnosis not present

## 2022-03-06 DIAGNOSIS — E118 Type 2 diabetes mellitus with unspecified complications: Secondary | ICD-10-CM

## 2022-03-06 DIAGNOSIS — E1169 Type 2 diabetes mellitus with other specified complication: Secondary | ICD-10-CM

## 2022-03-06 DIAGNOSIS — E785 Hyperlipidemia, unspecified: Secondary | ICD-10-CM | POA: Diagnosis not present

## 2022-03-06 DIAGNOSIS — F5104 Psychophysiologic insomnia: Secondary | ICD-10-CM

## 2022-03-06 DIAGNOSIS — I10 Essential (primary) hypertension: Secondary | ICD-10-CM

## 2022-03-06 DIAGNOSIS — J84112 Idiopathic pulmonary fibrosis: Secondary | ICD-10-CM

## 2022-03-06 NOTE — Progress Notes (Signed)
Patient ID: Kaitlin Hamilton, female    DOB: [birth year], 71 y.o.   MRN: [id number]  This visit was conducted in person.  BP 128/66   Pulse 87   Temp 97.8 F (36.6 C) (Temporal)   Ht 5' 5.5" (1.664 m)   Wt 217 lb 4 oz (98.5 kg)   SpO2 93% Comment: 5 L  BMI 35.60 kg/m    CC:  Chief Complaint  Patient presents with   Diabetes    Here for f/u. Pt provided recent BS readings.     Subjective:   HPI: Kaitlin Hamilton is a 71 y.o. female presenting on 03/06/2022 for Diabetes (Here for f/u. Pt provided recent BS readings. )  Diabetes: Well-controlled on Farxiga 10 mg p.o. daily, metformin XR 500 mg 4 tablets daily, Actos 45 mg p.o. daily   She has noted fasting blood sugars increased in August since starting the prednisone 20 mg daily.. likely to be on this  at least 3 months. ( I reviewed pulmonary note from July) Lab Results  Component Value Date   HGBA1C 6.9 (H) 02/27/2022  Using medications without difficulties: Hypoglycemic episodes: Hyperglycemic episodes: Feet problems: Blood Sugars averaging: in last month FBS 146-200 eye exam within last year:  Elevated Cholesterol: LDL at goal less than 100 on Crestor 10 mg p.o. daily Lab Results  Component Value Date   CHOL 215 (H) 02/27/2022   HDL 106.60 02/27/2022   LDLCALC 73 02/27/2022   LDLDIRECT 104.0 09/04/2017   TRIG 178.0 (H) 02/27/2022   CHOLHDL 2 02/27/2022  Using medications without problems: Muscle aches:  Diet compliance: moderate Exercise: none Other complaints:  Hypertension:  Well controlled on losartan 50 mg p.o. daily, HCTZ 25 mg daily and verapamil CR 180 mg p.o. daily BP Readings from Last 3 Encounters:  03/06/22 128/66  02/14/22 128/62  11/08/21 122/64  Using medication without problems or lightheadedness:  Chest pain with exertion: none Edema: none Short of breath: yes Average home BPs: Other issues:  UIP.Marland Kitchen now on prednisone and increased dose of Cellcept.  She has felt somewhat better with her  breathing.  Has upcoming ECHO eval to make sure no heart involvement.  MDD and chronic insomnia.. per pt poor sleep is primary issue for patient.  She is somewhat sad given she does not see family enough and given health issues.   She does not feels she is depressed.. she would to focus on  improving sleep.   Nortriptyline made her feel jittery.  Melatonin did not help. Trazodone caused itching in past  SE to ambein in past.       Relevant past medical, surgical, family and social history reviewed and updated as indicated. Interim medical history since our last visit reviewed. Allergies and medications reviewed and updated. Outpatient Medications Prior to Visit  Medication Sig Dispense Refill   acetaminophen (TYLENOL) 500 MG tablet Take 1,000 mg by mouth every 6 (six) hours as needed for moderate pain.     Ascorbic Acid (VITAMIN C PO) Take by mouth.     baclofen (LIORESAL) 10 MG tablet Take 1 tablet (10 mg total) by mouth 3 (three) times daily as needed. 30 tablet 0   FARXIGA 10 MG TABS tablet Take 1 tablet (10 mg total) by mouth daily. 90 tablet 1   FIBER PO Take 1 tablet by mouth daily.     fluticasone (FLONASE) 50 MCG/ACT nasal spray Place 1 spray into both nostrils daily.     hydrochlorothiazide (HYDRODIURIL) 12.5  MG tablet TAKE 1 TABLET DAILY ALONG WITH ONE OF THE LOSARTAN 50 MG TABLETS 90 tablet 1   levothyroxine (SYNTHROID) 125 MCG tablet TAKE 1 TABLET BY MOUTH DAILY  BEFORE BREAKFAST 90 tablet 3   losartan (COZAAR) 50 MG tablet TAKE 1 TABLET DAILY ALONG WITH 1 OF THE HCTZ 12.'5MG'$  TABLETS 90 tablet 1   metFORMIN (GLUCOPHAGE-XR) 500 MG 24 hr tablet Take 4 tablets (2,000 mg total) by mouth daily with breakfast. 360 tablet 1   montelukast (SINGULAIR) 10 MG tablet Take 1 tablet (10 mg total) by mouth at bedtime. 90 tablet 1   mycophenolate (CELLCEPT) 500 MG tablet Take '1500mg'$  twice a day 180 tablet 5   NON FORMULARY Place 4 L into the nose daily. 10 Liters with exertion     nystatin  cream (MYCOSTATIN) Apply 1 application topically 3 (three) times daily. 30 g 0   OneTouch Delica Lancets 55D MISC CHECK BLOOD SUGAR ONCE DAILY. 100 each 3   ONETOUCH ULTRA test strip CHECK BLOOD SUGAR DAILY. 50 strip 5   pantoprazole (PROTONIX) 40 MG tablet TAKE 1 TABLET BY MOUTH TWICE  DAILY 180 tablet 3   pioglitazone (ACTOS) 45 MG tablet Take 1 tablet (45 mg total) by mouth daily. 90 tablet 1   Pirfenidone 801 MG TABS TAKE 1 TABLET ('801MG'$ ) BY MOUTH  THREE TIMES DAILY WITH FOOD 270 tablet 1   predniSONE (DELTASONE) 20 MG tablet Take '20mg'$  daily 30 tablet 3   Probiotic Product (PROBIOTIC DAILY PO) Take by mouth.     rosuvastatin (CRESTOR) 10 MG tablet Take 1 tablet (10 mg total) by mouth daily. 90 tablet 3   verapamil (CALAN-SR) 180 MG CR tablet TAKE 1 TABLET BY MOUTH DAILY 90 tablet 3   No facility-administered medications prior to visit.     Per HPI unless specifically indicated in ROS section below Review of Systems  Constitutional:  Negative for fatigue and fever.  HENT:  Negative for congestion.   Eyes:  Negative for pain.  Respiratory:  Negative for cough and shortness of breath.   Cardiovascular:  Negative for chest pain, palpitations and leg swelling.  Gastrointestinal:  Negative for abdominal pain.  Genitourinary:  Negative for dysuria and vaginal bleeding.  Musculoskeletal:  Negative for back pain.  Neurological:  Negative for syncope, light-headedness and headaches.  Psychiatric/Behavioral:  Negative for dysphoric mood.    Objective:  BP 128/66   Pulse 87   Temp 97.8 F (36.6 C) (Temporal)   Ht 5' 5.5" (1.664 m)   Wt 217 lb 4 oz (98.5 kg)   SpO2 93% Comment: 5 L  BMI 35.60 kg/m   Wt Readings from Last 3 Encounters:  03/06/22 217 lb 4 oz (98.5 kg)  02/14/22 212 lb (96.2 kg)  11/08/21 213 lb 6.4 oz (96.8 kg)      Physical Exam Constitutional:      General: She is not in acute distress.    Appearance: Normal appearance. She is well-developed. She is not  ill-appearing or toxic-appearing.  HENT:     Head: Normocephalic.     Right Ear: Hearing, tympanic membrane, ear canal and external ear normal. Tympanic membrane is not erythematous, retracted or bulging.     Left Ear: Hearing, tympanic membrane, ear canal and external ear normal. Tympanic membrane is not erythematous, retracted or bulging.     Nose: No mucosal edema or rhinorrhea.     Right Sinus: No maxillary sinus tenderness or frontal sinus tenderness.     Left Sinus:  No maxillary sinus tenderness or frontal sinus tenderness.     Mouth/Throat:     Pharynx: Uvula midline.  Eyes:     General: Lids are normal. Lids are everted, no foreign bodies appreciated.     Conjunctiva/sclera: Conjunctivae normal.     Pupils: Pupils are equal, round, and reactive to light.  Neck:     Thyroid: No thyroid mass or thyromegaly.     Vascular: No carotid bruit.     Trachea: Trachea normal.  Cardiovascular:     Rate and Rhythm: Normal rate and regular rhythm.     Pulses: Normal pulses.     Heart sounds: Normal heart sounds, S1 normal and S2 normal. No murmur heard.    No friction rub. No gallop.  Pulmonary:     Effort: Pulmonary effort is normal. No tachypnea or respiratory distress.     Breath sounds: Normal breath sounds. No decreased breath sounds, wheezing, rhonchi or rales.  Abdominal:     General: Bowel sounds are normal.     Palpations: Abdomen is soft.     Tenderness: There is no abdominal tenderness.  Musculoskeletal:     Cervical back: Normal range of motion and neck supple.  Skin:    General: Skin is warm and dry.     Findings: No rash.  Neurological:     Mental Status: She is alert.  Psychiatric:        Mood and Affect: Mood is not anxious or depressed.        Speech: Speech normal.        Behavior: Behavior normal. Behavior is cooperative.        Thought Content: Thought content normal.        Judgment: Judgment normal.       Results for orders placed or performed in visit on  02/27/22  Comprehensive metabolic panel  Result Value Ref Range   Sodium 136 135 - 145 mEq/L   Potassium 4.1 3.5 - 5.1 mEq/L   Chloride 96 96 - 112 mEq/L   CO2 35 (H) 19 - 32 mEq/L   Glucose, Bld 159 (H) 70 - 99 mg/dL   BUN 17 6 - 23 mg/dL   Creatinine, Ser 0.62 0.40 - 1.20 mg/dL   Total Bilirubin 0.4 0.2 - 1.2 mg/dL   Alkaline Phosphatase 63 39 - 117 U/L   AST 13 0 - 37 U/L   ALT 10 0 - 35 U/L   Total Protein 7.8 6.0 - 8.3 g/dL   Albumin 4.7 3.5 - 5.2 g/dL   GFR 89.70 >60.00 mL/min   Calcium 10.9 (H) 8.4 - 10.5 mg/dL  Lipid panel  Result Value Ref Range   Cholesterol 215 (H) 0 - 200 mg/dL   Triglycerides 178.0 (H) 0.0 - 149.0 mg/dL   HDL 106.60 >39.00 mg/dL   VLDL 35.6 0.0 - 40.0 mg/dL   LDL Cholesterol 73 0 - 99 mg/dL   Total CHOL/HDL Ratio 2    NonHDL 108.39   Hemoglobin A1c  Result Value Ref Range   Hgb A1c MFr Bld 6.9 (H) 4.6 - 6.5 %     COVID 19 screen:  No recent travel or known exposure to COVID19 The patient denies respiratory symptoms of COVID 19 at this time. The importance of social distancing was discussed today.   Assessment and Plan Problem List Items Addressed This Visit     Chronic insomnia (Chronic)     Very poorly controlled.. Will discuss options for treatment with her pulmonologist.  Nortriptyline made her feel jittery.  Melatonin did not help. Trazodone caused itching in past  SE to ambein in past.      Controlled diabetes mellitus type 2 with complications (HCC) (Chronic)    Well-controlled on Farxiga 10 mg p.o. daily, metformin XR 500 mg 4 tablets daily, Actos 45 mg p.o. daily      Hyperlipidemia associated with type 2 diabetes mellitus (HCC) - Primary (Chronic)    Chronic LDL at goal less than 100 on Crestor 10 mg p.o. daily      Hypertension associated with diabetes (HCC) (Chronic)    Stable, chronic.  Continue current medication.   Well controlled on losartan 50 mg p.o. daily, HCTZ 25 mg daily and verapamil CR 180 mg p.o.  daily      UIP (usual interstitial pneumonitis) (HCC) (Chronic)    Now on prednisone and increased dose of Cellcept.  She has felt somewhat better with her breathing.  Has upcoming ECHO eval to make sure no heart involvement.      Patient Care Team: [name], MD as PCP - General (Family Medicine) Juanito Doom, MD as Consulting Physician (Pulmonary Disease) Charlton Haws, [facility] as Pharmacist (Pharmacist)     [name], MD

## 2022-03-06 NOTE — Patient Instructions (Signed)
Continue current medication and try to work more aggressively on low carb diet especially while on prednisone.  Stop soda and decrease bread. I will discuss  further sleep treatment with  pulmonologist.

## 2022-03-07 ENCOUNTER — Other Ambulatory Visit: Payer: Self-pay | Admitting: *Deleted

## 2022-03-07 ENCOUNTER — Telehealth: Payer: Self-pay | Admitting: Pulmonary Disease

## 2022-03-07 NOTE — Progress Notes (Signed)
Error

## 2022-03-07 NOTE — Telephone Encounter (Signed)
Called and spoke with pt's daughter Donella Stade letting her know that we would change the location for the echo to have it done in [place] and she verbalized understanding. Nothing further needed.

## 2022-03-11 ENCOUNTER — Ambulatory Visit (HOSPITAL_COMMUNITY): Payer: Medicare Other

## 2022-03-18 ENCOUNTER — Encounter: Payer: Self-pay | Admitting: Pulmonary Disease

## 2022-03-19 NOTE — Telephone Encounter (Signed)
"  Good afternoon. Kaitlin Hamilton had an appointment with her primary care provider recently and was under the impression that Dr. [name] was going to reach out to Dr. Vaughan Browner regarding sleep aid medication that he would feel comfortable with [name] taking (in light of her breathing and medications). She has not heard back any communication so she wanted me to reach out. I am unsure if the providers have had a chance to communicate with one another and if there may be a misunderstanding of who would prescribe what, if anything. My mom, [name], of course is having more trouble sleeping with the prednisone on top of her normal sleep issues. Any thoughts or recommendations are welcome and greatly appreciated."   Dr. Vaughan Browner please advise

## 2022-03-21 NOTE — Telephone Encounter (Signed)
I called and discussed with patient. She will reduce the prednisone dose to 10 mg/day

## 2022-03-29 NOTE — Assessment & Plan Note (Signed)
Chronic LDL at goal less than 100 on Crestor 10 mg p.o. daily

## 2022-03-29 NOTE — Assessment & Plan Note (Addendum)
Very poorly controlled.. Will discuss options for treatment with her pulmonologist.  Nortriptyline made her feel jittery.  Melatonin did not help. Trazodone caused itching in past  SE to ambein in past.

## 2022-03-29 NOTE — Assessment & Plan Note (Signed)
Now on prednisone and increased dose of Cellcept.  She has felt somewhat better with her breathing.  Has upcoming ECHO eval to make sure no heart involvement.

## 2022-03-29 NOTE — Assessment & Plan Note (Signed)
Stable, chronic.  Continue current medication.   Well controlled on losartan 50 mg p.o. daily, HCTZ 25 mg daily and verapamil CR 180 mg p.o. daily 

## 2022-03-29 NOTE — Assessment & Plan Note (Signed)
Well-controlled on Farxiga 10 mg p.o. daily, metformin XR 500 mg 4 tablets daily, Actos 45 mg p.o. daily

## 2022-04-03 ENCOUNTER — Ambulatory Visit (INDEPENDENT_AMBULATORY_CARE_PROVIDER_SITE_OTHER): Payer: Medicare Other | Admitting: Internal Medicine

## 2022-04-03 ENCOUNTER — Encounter: Payer: Self-pay | Admitting: Internal Medicine

## 2022-04-03 DIAGNOSIS — N3000 Acute cystitis without hematuria: Secondary | ICD-10-CM | POA: Diagnosis not present

## 2022-04-03 LAB — POC URINALSYSI DIPSTICK (AUTOMATED)
Bilirubin, UA: NEGATIVE
Glucose, UA: POSITIVE — AB
Ketones, UA: NEGATIVE
Leukocytes, UA: NEGATIVE
Nitrite, UA: NEGATIVE
Protein, UA: POSITIVE — AB
Spec Grav, UA: 1.015 (ref 1.010–1.025)
Urobilinogen, UA: 0.2 E.U./dL
pH, UA: 6 (ref 5.0–8.0)

## 2022-04-03 MED ORDER — CEFUROXIME AXETIL 250 MG PO TABS: 250.0000 mg | ORAL_TABLET | Freq: Two times a day (BID) | ORAL | 1 refills | Status: DC

## 2022-04-03 MED ORDER — MICONAZOLE 3 200 MG VA SUPP: 200.0000 mg | Freq: Every day | VAGINAL | 0 refills | Status: DC

## 2022-04-03 MED ORDER — FLUCONAZOLE 150 MG PO TABS: 150.0000 mg | ORAL_TABLET | Freq: Once | ORAL | 1 refills | Status: AC

## 2022-04-03 NOTE — Assessment & Plan Note (Addendum)
Fairly classic symptoms --not unusual with farxiga and prednisone Vaginal symptoms suggestive of separate yeast infection as well  Did have Klebsiella last year--but doesn't do well with augmentin Will give ceftin 250 bid for 3 days (with refill) Miconazole topical nightly x 3 Fluconazole if that isn't effective  Will send urine culture If ongoing vaginal symptoms, will need further testing

## 2022-04-03 NOTE — Addendum Note (Signed)
Addended by: Pilar Grammes on: 04/03/2022 03:38 PM   Modules accepted: Orders

## 2022-04-03 NOTE — Progress Notes (Signed)
Subjective:    Patient ID: Kaitlin Hamilton, female    DOB: [birth year], 71 y.o.   MRN: [id number]  HPI Here due to urinary and vaginal symptoms  Having bad burning dysuria Also with vaginal itching Both started 2 days ago No hematuria No vaginal discharge--using vaseline/vagisil Some urinary urgency---more now than usual with farxiga  Current Outpatient Medications on File Prior to Visit  Medication Sig Dispense Refill   acetaminophen (TYLENOL) 500 MG tablet Take 1,000 mg by mouth every 6 (six) hours as needed for moderate pain.     Ascorbic Acid (VITAMIN C PO) Take by mouth.     baclofen (LIORESAL) 10 MG tablet Take 1 tablet (10 mg total) by mouth 3 (three) times daily as needed. 30 tablet 0   FARXIGA 10 MG TABS tablet Take 1 tablet (10 mg total) by mouth daily. 90 tablet 1   FIBER PO Take 1 tablet by mouth daily.     fluticasone (FLONASE) 50 MCG/ACT nasal spray Place 1 spray into both nostrils daily.     hydrochlorothiazide (HYDRODIURIL) 12.5 MG tablet TAKE 1 TABLET DAILY ALONG WITH ONE OF THE LOSARTAN 50 MG TABLETS 90 tablet 1   levothyroxine (SYNTHROID) 125 MCG tablet TAKE 1 TABLET BY MOUTH DAILY  BEFORE BREAKFAST 90 tablet 3   losartan (COZAAR) 50 MG tablet TAKE 1 TABLET DAILY ALONG WITH 1 OF THE HCTZ 12.'5MG'$  TABLETS 90 tablet 1   metFORMIN (GLUCOPHAGE-XR) 500 MG 24 hr tablet Take 4 tablets (2,000 mg total) by mouth daily with breakfast. 360 tablet 1   montelukast (SINGULAIR) 10 MG tablet Take 1 tablet (10 mg total) by mouth at bedtime. 90 tablet 1   mycophenolate (CELLCEPT) 500 MG tablet Take '1500mg'$  twice a day 180 tablet 5   NON FORMULARY Place 4 L into the nose daily. 10 Liters with exertion     nystatin cream (MYCOSTATIN) Apply 1 application topically 3 (three) times daily. 30 g 0   OneTouch Delica Lancets 01U MISC CHECK BLOOD SUGAR ONCE DAILY. 100 each 3   ONETOUCH ULTRA test strip CHECK BLOOD SUGAR DAILY. 50 strip 5   pantoprazole (PROTONIX) 40 MG tablet TAKE 1 TABLET BY MOUTH  TWICE  DAILY 180 tablet 3   pioglitazone (ACTOS) 45 MG tablet Take 1 tablet (45 mg total) by mouth daily. 90 tablet 1   Pirfenidone 801 MG TABS TAKE 1 TABLET ('801MG'$ ) BY MOUTH  THREE TIMES DAILY WITH FOOD 270 tablet 1   predniSONE (DELTASONE) 20 MG tablet Take '20mg'$  daily 30 tablet 3   Probiotic Product (PROBIOTIC DAILY PO) Take by mouth.     rosuvastatin (CRESTOR) 10 MG tablet Take 1 tablet (10 mg total) by mouth daily. 90 tablet 3   verapamil (CALAN-SR) 180 MG CR tablet TAKE 1 TABLET BY MOUTH DAILY 90 tablet 3   No current facility-administered medications on file prior to visit.    Allergies  Allergen Reactions   Adhesive [Tape]     Redness, bruising with any adhesives- bandaids, patches, etc.    Atovaquone Itching   Codeine Hives and Swelling   Demerol [Meperidine] Hives and Swelling   Hydrocodone Nausea Only   Trazodone And Nefazodone Itching   Sulfa Antibiotics Rash    Past Medical History:  Diagnosis Date   Allergic rhinitis    uses Flonase daily   Anesthesia complication    Per pt/ past endoscopy in 2015, the anesthetic spray in back throat caused her to have breathing problems   Anxiety  Bronchitis    Chicken pox    Constipation    takes Fiber daily   Depression    Diverticulosis    Dysphagia    GERD (gastroesophageal reflux disease)    takes Protonix daily   H/O hiatal hernia    Hemorrhoids    History of bronchitis    2013   History of colon polyps    History of kidney stones    Hyperlipidemia    lost 43 pounds and no meds required at present   Hypertension    takes Verapamil and HCTZ daily   Hypokalemia    Hypothyroidism (acquired)    takes Synthroid daily   Insomnia    doesn't take any meds for this   Joint swelling    left thumb   Migraines    last one about a month ago   Non-insulin dependent type 2 diabetes mellitus (HCC)    takes Glipizide daily   OA (osteoarthritis)    left knee   Oxygen deficiency    Oxygen dependent    Pt is on  continuous O2 at 3 liters!   Pneumonia    last time in 2006   PONV (postoperative nausea and vomiting)    Shortness of breath    with exertion;takes Singulair daily as well as Flonase   Urinary frequency     Past Surgical History:  Procedure Laterality Date   ABDOMINAL EXPLORATION SURGERY     For Ovarian Cyst    APPENDECTOMY     CHOLECYSTECTOMY     COLONOSCOPY  05/2013   TA, severe diverticulosis, rpt 3 yrs ([place])   ENTEROSCOPY N/A 08/12/2013   Procedure: ENTEROSCOPY;  Surgeon: Lafayette Dragon, MD;  Location: [facility];  Service: Endoscopy;  Laterality: N/A;   ESOPHAGOGASTRODUODENOSCOPY     left knee arthroscopy     LITHOTRIPSY     x 2   LUNG BIOPSY Right 06/29/2013   Procedure: LUNG BIOPSY;  Surgeon: Melrose Nakayama, MD;  Location: [facility];  Service: Thoracic;  Laterality: Right;   TCS     TUBAL LIGATION     VIDEO ASSISTED THORACOSCOPY Right 06/29/2013   Procedure: VIDEO ASSISTED THORACOSCOPY;  Surgeon: Melrose Nakayama, MD;  Location: [facility];  Service: Thoracic;  Laterality: Right;    Family History  Problem Relation Age of Onset   Rheum arthritis Mother    Rheum arthritis Sister    Prostate cancer Brother    Heart disease Brother    Heart disease Maternal Grandmother    Heart disease Maternal Grandfather    Uterine cancer Daughter    Colon cancer Neg Hx    Breast cancer Neg Hx     Social History   Socioeconomic History   Marital status: Widowed    Spouse name: Not on file   Number of children: 2   Years of education: Not on file   Highest education level: Not on file  Occupational History   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   Smoking status: Never    Passive exposure: Past   Smokeless tobacco: Never  Vaping Use   Vaping Use: Never used  Substance and Sexual Activity   Alcohol use: No    Alcohol/week: 0.0 standard drinks of alcohol   Drug use: No   Sexual activity: Never    Birth control/protection: Post-menopausal  Other Topics  Concern   Not on file  Social History Narrative   Daily caffeine     Exercise: none  recently    Diet: poor   Social Determinants of Health   Financial Resource Strain: Low Risk  (11/07/2021)   Overall Financial Resource Strain (CARDIA)    Difficulty of Paying Living Expenses: Not very hard  Food Insecurity: No Food Insecurity (11/07/2021)   Hunger Vital Sign    Worried About Running Out of Food in the Last Year: Never true    Ran Out of Food in the Last Year: Never true  Transportation Needs: No Transportation Needs (07/12/2019)   PRAPARE - Hydrologist (Medical): No    Lack of Transportation (Non-Medical): No  Physical Activity: Inactive (07/12/2019)   Exercise Vital Sign    Days of Exercise per Week: 0 days    Minutes of Exercise per Session: 0 min  Stress: No Stress Concern Present (07/12/2019)   [place]    Feeling of Stress : Not at all  Social Connections: Not on file  Intimate Partner Violence: Not At Risk (07/12/2019)   Humiliation, Afraid, Rape, and Kick questionnaire    Fear of Current or Ex-Partner: No    Emotionally Abused: No    Physically Abused: No    Sexually Abused: No   Review of Systems Has been on prednisone for lung issues No fever---or ?low grade yesterday No N/V Eating okay    Objective:   Physical Exam Constitutional:      Appearance: Normal appearance.  Abdominal:     Tenderness: There is no right CVA tenderness or left CVA tenderness.     Comments: Slight suprapubic tenderness  Neurological:     Mental Status: She is alert.            Assessment & Plan:

## 2022-04-04 DIAGNOSIS — G4733 Obstructive sleep apnea (adult) (pediatric): Secondary | ICD-10-CM | POA: Diagnosis not present

## 2022-04-04 DIAGNOSIS — J8489 Other specified interstitial pulmonary diseases: Secondary | ICD-10-CM | POA: Diagnosis not present

## 2022-04-04 LAB — URINE CULTURE
MICRO NUMBER:: 13917977
SPECIMEN QUALITY:: ADEQUATE

## 2022-04-07 ENCOUNTER — Encounter: Payer: Self-pay | Admitting: Internal Medicine

## 2022-04-07 ENCOUNTER — Other Ambulatory Visit: Payer: Self-pay | Admitting: Family Medicine

## 2022-04-07 ENCOUNTER — Encounter: Payer: Self-pay | Admitting: Pulmonary Disease

## 2022-04-07 NOTE — Telephone Encounter (Signed)
Not sure if the prednisone has helped with her breathing.  I am okay holding the medication since she is having side effects.

## 2022-04-08 ENCOUNTER — Other Ambulatory Visit: Payer: Medicare Other

## 2022-04-08 ENCOUNTER — Ambulatory Visit (INDEPENDENT_AMBULATORY_CARE_PROVIDER_SITE_OTHER): Payer: Medicare Other | Admitting: Family Medicine

## 2022-04-08 ENCOUNTER — Encounter: Payer: Self-pay | Admitting: Family Medicine

## 2022-04-08 VITALS — BP 112/60 | HR 91 | Temp 97.8°F | Ht 65.5 in | Wt 220.5 lb

## 2022-04-08 DIAGNOSIS — N898 Other specified noninflammatory disorders of vagina: Secondary | ICD-10-CM

## 2022-04-08 DIAGNOSIS — E118 Type 2 diabetes mellitus with unspecified complications: Secondary | ICD-10-CM | POA: Diagnosis not present

## 2022-04-08 MED ORDER — FLUCONAZOLE 150 MG PO TABS: 150.0000 mg | ORAL_TABLET | Freq: Every day | ORAL | 0 refills | Status: DC

## 2022-04-08 MED ORDER — ZALEPLON 5 MG PO CAPS: 5.0000 mg | ORAL_CAPSULE | Freq: Every evening | ORAL | 0 refills | Status: DC | PRN

## 2022-04-08 NOTE — Assessment & Plan Note (Signed)
Acute, worsening following antibiotic  Most likely secondary to yeast in setting of diabetic on Farxiga. Send out wet prep test sent. Will treat with fluconazole 150 mg p.o. daily x3 days.  Stop multiple topicals.  Apply barrier cream.  Use depends to keep perineum dry.

## 2022-04-08 NOTE — Assessment & Plan Note (Addendum)
Pulmonary is now stopping her prednisone.  Sugar control will likely improve.  Given possible complication from [place] I will have her hold this over the next week.  Hopefully we can have her remain off this medication if her sugar control improves off steroids.

## 2022-04-08 NOTE — Progress Notes (Signed)
Patient ID: Kaitlin Hamilton, female    DOB: 1950-09-20, 71 y.o.   MRN: [id number]  This visit was conducted in person.  BP 112/60   Pulse 91   Temp 97.8 F (36.6 C) (Oral)   Ht 5' 5.5" (1.664 m)   Wt 220 lb 8 oz (100 kg)   SpO2 96% Comment: 6L O2  BMI 36.14 kg/m    CC:  Chief Complaint  Patient presents with   Vaginal Pain    Swelling/Itching-Seen Dr. Silvio Pate 04/03/22    Subjective:   HPI: Kaitlin Hamilton is a 71 y.o. female presenting on 04/08/2022 for Vaginal Pain (Swelling/Itching-Seen Dr. Silvio Pate 04/03/22)  Reviewed OV note from 04/03/2022 with Dr. Silvio Pate. Dx with UTI ( more likely given on farxiga and prednisone) Treated with ceftin 250 bid for 3 days (with refill) Miconazole topical nightly x 3 Fluconazole if that isn't effective    Culture showed no bacteria  Today she reports she has been using monistat suppositories and vaginal cream.  Has tried 1 tablet of fluconazole.   She   is having burning on outside and inside of vaginal.  Pin sticking pain.  Burning when urine hits.  Vaginal swelling per patient.  No blood in urine.   Always has urinary urgency.  Has tried azo and vaseline. Using barrier cream as well to protect from urine  Lab Results  Component Value Date   HGBA1C 6.9 (H) 02/27/2022     Relevant past medical, surgical, family and social history reviewed and updated as indicated. Interim medical history since our last visit reviewed. Allergies and medications reviewed and updated. Outpatient Medications Prior to Visit  Medication Sig Dispense Refill   acetaminophen (TYLENOL) 500 MG tablet Take 1,000 mg by mouth every 6 (six) hours as needed for moderate pain.     Ascorbic Acid (VITAMIN C PO) Take by mouth.     baclofen (LIORESAL) 10 MG tablet Take 1 tablet (10 mg total) by mouth 3 (three) times daily as needed. 30 tablet 0   FARXIGA 10 MG TABS tablet Take 1 tablet (10 mg total) by mouth daily. 90 tablet 1   FIBER PO Take 1 tablet by mouth daily.      fluconazole (DIFLUCAN) 150 MG tablet Take 150 mg by mouth every 3 (three) days.     fluticasone (FLONASE) 50 MCG/ACT nasal spray Place 1 spray into both nostrils daily.     hydrochlorothiazide (HYDRODIURIL) 12.5 MG tablet TAKE 1 TABLET DAILY ALONG WITH ONE OF THE LOSARTAN 50 MG TABLETS 90 tablet 1   levothyroxine (SYNTHROID) 125 MCG tablet TAKE 1 TABLET BY MOUTH DAILY  BEFORE BREAKFAST 90 tablet 3   losartan (COZAAR) 50 MG tablet TAKE 1 TABLET DAILY ALONG WITH 1 OF THE HCTZ 12.5MG TABLETS 90 tablet 1   metFORMIN (GLUCOPHAGE-XR) 500 MG 24 hr tablet Take 4 tablets (2,000 mg total) by mouth daily with breakfast. 360 tablet 1   MONISTAT 3 COMBINATION PACK 200 & 2 MG-% (9GM) KIT      montelukast (SINGULAIR) 10 MG tablet Take 1 tablet (10 mg total) by mouth at bedtime. 90 tablet 1   mycophenolate (CELLCEPT) 500 MG tablet Take 1591m twice a day 180 tablet 5   NON FORMULARY Place 4 L into the nose daily. 10 Liters with exertion     nystatin cream (MYCOSTATIN) Apply 1 application topically 3 (three) times daily. 30 g 0   OneTouch Delica Lancets 337OMISC CHECK BLOOD SUGAR ONCE DAILY. 100 each  3   ONETOUCH ULTRA test strip CHECK BLOOD SUGAR DAILY. 50 strip 5   pantoprazole (PROTONIX) 40 MG tablet TAKE 1 TABLET BY MOUTH TWICE  DAILY 180 tablet 3   pioglitazone (ACTOS) 45 MG tablet Take 1 tablet (45 mg total) by mouth daily. 90 tablet 1   Pirfenidone 801 MG TABS TAKE 1 TABLET (801MG) BY MOUTH  THREE TIMES DAILY WITH FOOD 270 tablet 1   Probiotic Product (PROBIOTIC DAILY PO) Take by mouth.     rosuvastatin (CRESTOR) 10 MG tablet Take 1 tablet (10 mg total) by mouth daily. 90 tablet 3   verapamil (CALAN-SR) 180 MG CR tablet TAKE 1 TABLET BY MOUTH DAILY 90 tablet 3   cefUROXime (CEFTIN) 250 MG tablet Take 1 tablet (250 mg total) by mouth 2 (two) times daily with a meal. 6 tablet 1   miconazole (MICONAZOLE 3) 200 MG vaginal suppository Place 1 suppository (200 mg total) vaginally at bedtime. 3 suppository 0    predniSONE (DELTASONE) 20 MG tablet Take 28m daily 30 tablet 3   No facility-administered medications prior to visit.     Per HPI unless specifically indicated in ROS section below Review of Systems  Constitutional:  Negative for fatigue and fever.  HENT:  Negative for congestion.   Eyes:  Negative for pain.  Respiratory:  Negative for cough and shortness of breath.   Cardiovascular:  Negative for chest pain, palpitations and leg swelling.  Gastrointestinal:  Negative for abdominal pain.  Genitourinary:  Negative for dysuria and vaginal bleeding.  Musculoskeletal:  Negative for back pain.  Neurological:  Negative for syncope, light-headedness and headaches.  Psychiatric/Behavioral:  Negative for dysphoric mood.    Objective:  BP 112/60   Pulse 91   Temp 97.8 F (36.6 C) (Oral)   Ht 5' 5.5" (1.664 m)   Wt 220 lb 8 oz (100 kg)   SpO2 96% Comment: 6L O2  BMI 36.14 kg/m   Wt Readings from Last 3 Encounters:  04/08/22 220 lb 8 oz (100 kg)  04/03/22 218 lb (98.9 kg)  03/06/22 217 lb 4 oz (98.5 kg)      Physical Exam Exam conducted with a chaperone present.  Constitutional:      General: She is not in acute distress.    Appearance: Normal appearance. She is well-developed. She is not ill-appearing or toxic-appearing.  HENT:     Head: Normocephalic.     Right Ear: Hearing, tympanic membrane, ear canal and external ear normal. Tympanic membrane is not erythematous, retracted or bulging.     Left Ear: Hearing, tympanic membrane, ear canal and external ear normal. Tympanic membrane is not erythematous, retracted or bulging.     Nose: No mucosal edema or rhinorrhea.     Right Sinus: No maxillary sinus tenderness or frontal sinus tenderness.     Left Sinus: No maxillary sinus tenderness or frontal sinus tenderness.     Mouth/Throat:     Pharynx: Uvula midline.  Eyes:     General: Lids are normal. Lids are everted, no foreign bodies appreciated.     Conjunctiva/sclera:  Conjunctivae normal.     Pupils: Pupils are equal, round, and reactive to light.  Neck:     Thyroid: No thyroid mass or thyromegaly.     Vascular: No carotid bruit.     Trachea: Trachea normal.  Cardiovascular:     Rate and Rhythm: Normal rate and regular rhythm.     Pulses: Normal pulses.     Heart sounds:  Normal heart sounds, S1 normal and S2 normal. No murmur heard.    No friction rub. No gallop.  Pulmonary:     Effort: Pulmonary effort is normal. No tachypnea or respiratory distress.     Breath sounds: Normal breath sounds. No decreased breath sounds, wheezing, rhonchi or rales.  Abdominal:     General: Bowel sounds are normal.     Palpations: Abdomen is soft.     Tenderness: There is no abdominal tenderness.  Genitourinary:    Exam position: Lithotomy position.     Pubic Area: No rash.      Labia:        Right: Tenderness present.        Left: Tenderness present.      Comments: Irritation vaginal tissue and perineum, erythema yellowish discharge There is abrasion on the left labial fold Musculoskeletal:     Cervical back: Normal range of motion and neck supple.  Skin:    General: Skin is warm and dry.     Findings: No rash.  Neurological:     Mental Status: She is alert.  Psychiatric:        Mood and Affect: Mood is not anxious or depressed.        Speech: Speech normal.        Behavior: Behavior normal. Behavior is cooperative.        Thought Content: Thought content normal.        Judgment: Judgment normal.       Results for orders placed or performed in visit on 04/03/22  Urine Culture   Specimen: Urine  Result Value Ref Range   MICRO NUMBER: [id number]    SPECIMEN QUALITY: Adequate    Sample Source URINE, CLEAN CATCH    STATUS: FINAL    Result:      Mixed genital flora isolated. These superficial bacteria are not indicative of a urinary tract infection. No further organism identification is warranted on this specimen. If clinically indicated, recollect  clean-catch, mid-stream urine and transfer  immediately to Urine Culture Transport Tube.   POCT Urinalysis Dipstick (Automated)  Result Value Ref Range   Color, UA yellow    Clarity, UA slightly cloudy    Glucose, UA Positive (A) Negative   Bilirubin, UA negative    Ketones, UA negative    Spec Grav, UA 1.015 1.010 - 1.025   Blood, UA trace    pH, UA 6.0 5.0 - 8.0   Protein, UA Positive (A) Negative   Urobilinogen, UA 0.2 0.2 or 1.0 E.U./dL   Nitrite, UA negative    Leukocytes, UA Negative Negative     COVID 19 screen:  No recent travel or known exposure to COVID19 The patient denies respiratory symptoms of COVID 19 at this time. The importance of social distancing was discussed today.   Assessment and Plan    Problem List Items Addressed This Visit     Controlled diabetes mellitus type 2 with complications ([place]) (Chronic)    Pulmonary is now stopping her prednisone.  Sugar control will likely improve.  Given possible complication from [place] I will have her hold this over the next week.  Hopefully we can have her remain off this medication if her sugar control improves off steroids.      Vaginal itching - Primary    Acute, worsening following antibiotic  Most likely secondary to yeast in setting of diabetic on Farxiga. Send out wet prep test sent. Will treat with fluconazole 150 mg p.o. daily  x3 days.  Stop multiple topicals.  Apply barrier cream.  Use depends to keep perineum dry.       Relevant Orders   WET PREP BY MOLECULAR PROBE   Meds ordered this encounter  Medications   zaleplon (SONATA) 5 MG capsule    Sig: Take 1 capsule (5 mg total) by mouth at bedtime as needed for sleep.    Dispense:  30 capsule    Refill:  0   fluconazole (DIFLUCAN) 150 MG tablet    Sig: Take 1 tablet (150 mg total) by mouth daily.    Dispense:  3 tablet    Refill:  0     [name], MD

## 2022-04-08 NOTE — Patient Instructions (Addendum)
Stop all topical meds except vaseline  or Desitin as barrier cream.  Complete course of antifungal.  Hold farxiga for 1 week then restart.  We will call with vaginal test results.

## 2022-04-09 ENCOUNTER — Ambulatory Visit: Payer: Medicare Other | Admitting: Family Medicine

## 2022-04-09 ENCOUNTER — Other Ambulatory Visit: Payer: Self-pay | Admitting: Family Medicine

## 2022-04-09 LAB — WET PREP BY MOLECULAR PROBE
Candida species: NOT DETECTED
Gardnerella vaginalis: NOT DETECTED
MICRO NUMBER:: 13937829
SPECIMEN QUALITY:: ADEQUATE
Trichomonas vaginosis: NOT DETECTED

## 2022-04-09 NOTE — Telephone Encounter (Signed)
Last office visit 04/08/22 for Vaginal Itching.  Last refilled 02/12/22 for 30 g with no refills.  Next Appt: 06/06/22 for DM.

## 2022-04-10 ENCOUNTER — Encounter: Payer: Self-pay | Admitting: Family Medicine

## 2022-04-11 ENCOUNTER — Other Ambulatory Visit: Payer: Self-pay | Admitting: Family Medicine

## 2022-04-11 MED ORDER — TRIAMCINOLONE ACETONIDE 0.1 % EX CREA
1.0000 | TOPICAL_CREAM | Freq: Two times a day (BID) | CUTANEOUS | 0 refills | Status: DC
Start: 2022-04-11 — End: 2022-04-30

## 2022-04-17 ENCOUNTER — Other Ambulatory Visit: Payer: Self-pay | Admitting: Family Medicine

## 2022-04-17 ENCOUNTER — Telehealth (INDEPENDENT_AMBULATORY_CARE_PROVIDER_SITE_OTHER): Payer: Medicare Other | Admitting: Pulmonary Disease

## 2022-04-17 ENCOUNTER — Encounter: Payer: Self-pay | Admitting: Pulmonary Disease

## 2022-04-17 DIAGNOSIS — Z5181 Encounter for therapeutic drug level monitoring: Secondary | ICD-10-CM

## 2022-04-17 DIAGNOSIS — J9611 Chronic respiratory failure with hypoxia: Secondary | ICD-10-CM | POA: Diagnosis not present

## 2022-04-17 DIAGNOSIS — G4733 Obstructive sleep apnea (adult) (pediatric): Secondary | ICD-10-CM | POA: Diagnosis not present

## 2022-04-17 DIAGNOSIS — J84112 Idiopathic pulmonary fibrosis: Secondary | ICD-10-CM

## 2022-04-17 NOTE — Progress Notes (Signed)
Kaitlin Hamilton    [id number]    07/20/1951  Primary Care Physician:Bedsole, Mervyn Gay, MD  Referring Physician: [name], MD [place],  [place] [address]  Virtual Visit via Video Note  I connected with Kaitlin Hamilton on 04/17/22 at 10:30 AM EDT by a video enabled telemedicine application and verified that I am speaking with the correct person using two identifiers.  Location: Patient: Home Provider: [address]   I discussed the limitations of evaluation and management by telemedicine and the availability of in person appointments. The patient expressed understanding and agreed to proceed.  Chief complaint: Follow-up for interstitial lung disease  HPI: 71 year old with history of biopsy-proven UIP pulmonary fibrosis.  Previously followed by Dr. [name] lung biopsy in December 2014 with findings showing UIP fibrosis.  There is strong suspicion for underlying connective tissue disease as she responds to prednisone. She has also been evaluated by Dr. [name] [name] [facility]. Esbriet started in 2018 for progressive pulmonary fibrosis. She got a dose of evusheld for COVID prophylaxis in April 2022  She has had 2 episodes of diverticulitis in 2019 and 2020, E. coli UTI in 2020 and Campylobacter colitis in December 2020. We reduced her CellCept to 500 mg twice daily due to recurrent episodes of UTI however her breathing got worse and she is back now at 1 g twice daily with stabilization of symptoms  We had ordered home sleep study in 2023 but patient did not want to go through that as it required taking the test on room air  Interim history: Continues on Esbriet and CellCept Reports gradually worsening dyspnea on exertion over the past year with increased fatigue.  At last visit we had increase CellCept to 1.5 g twice daily and started prednisone  Breathing is slightly better today but prednisone stopped due to issues with insomnia, vaginal itching  and rash.  Wilder Glade was also held by primary care and given a course of Diflucan.  Outpatient Encounter Medications as of 04/17/2022  Medication Sig   acetaminophen (TYLENOL) 500 MG tablet Take 1,000 mg by mouth every 6 (six) hours as needed for moderate pain.   Ascorbic Acid (VITAMIN C PO) Take by mouth.   baclofen (LIORESAL) 10 MG tablet Take 1 tablet (10 mg total) by mouth 3 (three) times daily as needed.   FIBER PO Take 1 tablet by mouth daily.   fluconazole (DIFLUCAN) 150 MG tablet Take 1 tablet (150 mg total) by mouth daily.   fluticasone (FLONASE) 50 MCG/ACT nasal spray Place 1 spray into both nostrils daily.   hydrochlorothiazide (HYDRODIURIL) 12.5 MG tablet TAKE 1 TABLET DAILY ALONG WITH ONE OF THE LOSARTAN 50 MG TABLETS   levothyroxine (SYNTHROID) 125 MCG tablet TAKE 1 TABLET BY MOUTH DAILY  BEFORE BREAKFAST   losartan (COZAAR) 50 MG tablet TAKE 1 TABLET DAILY ALONG WITH 1 OF THE HCTZ 12.5MG TABLETS   metFORMIN (GLUCOPHAGE-XR) 500 MG 24 hr tablet Take 4 tablets (2,000 mg total) by mouth daily with breakfast.   MONISTAT 3 COMBINATION PACK 200 & 2 MG-% (9GM) KIT    montelukast (SINGULAIR) 10 MG tablet Take 1 tablet (10 mg total) by mouth at bedtime.   mycophenolate (CELLCEPT) 500 MG tablet Take 1550m twice a day   NON FORMULARY Place 4 L into the nose daily. 10 Liters with exertion   nystatin cream (MYCOSTATIN) Apply 1 application topically 3 (three) times daily.  OneTouch Delica Lancets 79G MISC CHECK BLOOD SUGAR ONCE DAILY.   ONETOUCH ULTRA test strip CHECK BLOOD SUGAR DAILY.   pantoprazole (PROTONIX) 40 MG tablet TAKE 1 TABLET BY MOUTH TWICE  DAILY   pioglitazone (ACTOS) 45 MG tablet Take 1 tablet (45 mg total) by mouth daily.   Pirfenidone 801 MG TABS TAKE 1 TABLET (801MG) BY MOUTH  THREE TIMES DAILY WITH FOOD   Probiotic Product (PROBIOTIC DAILY PO) Take by mouth.   rosuvastatin (CRESTOR) 10 MG tablet Take 1 tablet (10 mg total) by mouth daily.   triamcinolone cream (KENALOG)  0.1 % Apply 1 Application topically 2 (two) times daily.   verapamil (CALAN-SR) 180 MG CR tablet TAKE 1 TABLET BY MOUTH DAILY   zaleplon (SONATA) 5 MG capsule Take 1 capsule (5 mg total) by mouth at bedtime as needed for sleep.   FARXIGA 10 MG TABS tablet Take 1 tablet (10 mg total) by mouth daily. (Patient not taking: Reported on 04/17/2022)   No facility-administered encounter medications on file as of 04/17/2022.   Physical Exam: Tele  Data Reviewed: Imaging: 11/2012 CT chest >>  centrilobular nodules, interlobular septal thickening worse in bases and periphery R lung > L; some GGO in bases and periphery as well, some bronchiectasis in the bases R > L; findings suggestive of fibrosis but not UIP; question hypersensitivity pneumonitis given centrilobular nodules; also question aspiration   04/2013 Barium swallow> abnormal esophageal motility, GERD, hiatal hernia   04/2013 CT chest ([name])> findings suggestive of but not diagnostic of NSIP, small pulmonary nodule  February 2016 CT chest high resolution> slight progression in reticular abnormalities consistent with pulmonary fibrosis, uip  High-resolution CT chest 08/04/2019-basilar predominant fibrotic interstitial lung disease with mild honeycombing.  Mild progressions 2016.  UIP pattern  High-resolution CT 03/09/2021-stable UIP pattern pulmonary fibrosis  CT high-resolution 02/07/2022-UIP pattern pulmonary fibrosis stable compared to 2022 however overall progressed since 2016. I reviewed the images personally   PFTs: 04/28/2018 FVC 1.1 [35%], FEV1 1.02 [42%], F/F 92, DLCO 8.88 [36%] Severe restriction, diffusion impairment. With worsening compared to prior years.  10/12/2019 FVC 1.13 [36%], FEV1 1.05 [44%], F/F 93, DLCO 8.76 [44%] Severe restriction, diffusion impairment.  Stable compared to 2019  09/28/2020 FVC 1.03 [33%], FEV1 0.97 [41%], DLCO 7.94 [40%] Severe restriction, diffusion impairment  02/14/2022 FVC 0.99 [33%], FEV1  0.95 [41%], F/F 96, TLC 2.12 [41%], DLCO 6.87 [35%] Severe restriction, diffusion impairment.  Worsening compared to 2022  6 minute walk 10/19/2012 walked 500 feet in office on room air oxygenation did not drop below 90% 04/2013 6MW RA > 1100 feet, HR peak 109, O2 sat Nadir 87% 11/2013 6MW 1364 feet, O2 saturation nadir 78% 08/21/14 6MW 1400 feet 90% on 8L  Labs: 03/2013 ANA, ANCA, Anti-Jo-1, ESR, RF, SCL-70, anti-centromere, SSA/SSB, all negative; CRP 0.6;   07/17/2019-CBC within normal limits 07/26/2020-hepatic panel within normal limits  Assessment:  UIP fibrosis Presumed autoimmune component given response to immunosuppressive's in the past Currently maintained on CellCept 1 g twice daily, pirfenidone. Not on PJP prophylaxis due to allergies to sulfa drugs and atovaquone  Notes worsening dyspnea and fatigue.  Although CT is stable for the past 1 year clinically she is worse and PFTs show worsening restriction and diffusion impairment.  We ordered echocardiogram for evaluation of pulmonary hypertension though proBNP's in the past have been normal.  But this has been delayed and rescheduled to 2 weeks from now.    Hold prednisone due to side effects.  Continue CellCept  at increased dose.  Continue antifibrotic therapy.  Unfortunately this may be just worsening of her fibrosis which in fact has been progressive since 2016.  We will reassess after echocardiogram.  She may be candidate for clinical trials.  OSA Continue CPAP  Plan/Recommendations: - Continue pirfenidone, CellCept - Follow echocardiogram   I discussed the assessment and treatment plan with the patient. The patient was provided an opportunity to ask questions and all were answered. The patient agreed with the plan and demonstrated an understanding of the instructions.   The patient was advised to call back or seek an in-person evaluation if the symptoms worsen or if the condition fails to improve as anticipated.  I  provided 35 minutes of non-face-to-face time during this encounter.  [name] MD [facility] 04/17/2022, 10:50 AM  CC: [name], MD

## 2022-04-17 NOTE — Addendum Note (Signed)
Addended by: Valerie Salts on: 04/17/2022 12:02 PM   Modules accepted: Orders

## 2022-04-17 NOTE — Patient Instructions (Signed)
We will review the results of the echocardiogram when completed We will order CBC and CMP.  Please have these drawn at your next visit to primary care Follow-up in 2 to 3 months with either video visit or in person visit.

## 2022-04-17 NOTE — Telephone Encounter (Signed)
Last office visit 04/08/22 for vaginal itching.  Last refilled 04/09/22 for 30 g with no refills.  Next Appt: 06/06/22 for DM.

## 2022-04-24 ENCOUNTER — Telehealth: Payer: Self-pay

## 2022-04-24 ENCOUNTER — Other Ambulatory Visit: Payer: Self-pay | Admitting: Family Medicine

## 2022-04-24 ENCOUNTER — Telehealth: Payer: Self-pay | Admitting: Pulmonary Disease

## 2022-04-24 DIAGNOSIS — R0602 Shortness of breath: Secondary | ICD-10-CM

## 2022-04-24 NOTE — Telephone Encounter (Signed)
Called and spoke with patient's daughter [name].  [name] stated patient is having some leg/feet swelling.  [name] stated patient is not eating the way should should and staying hydrated.  [name] stated patient checked her glucose this morning and it was 167, but patient ate 4 cookies at 4am.  [name] stated patient stated her O2 was 60-70% on 8-10L O2, but [name] did not think that was a good reading.  Patient has complained of anxiety, chest tightness, and lightheadedness.  [name] stated patient stated she was not feeling good and has not been able to sleep good.  BP this morning 123/68 and  HR 86 at 1245.  I advised with low sats patient needed to evaluated. [name] stated she did not feel patient needed to be seen in ED, because she did not feel O2 reading was correct.  [name] did want Dr. Vaughan Browner to be aware and for recommendations.    Message routed to Dr. Vaughan Browner to advise

## 2022-04-24 NOTE — Chronic Care Management (AMB) (Signed)
    Chronic Care Management Pharmacy Assistant   Name: WENDEE HATA  MRN: [id number] DOB: [birth year]  Reason for Encounter: Reminder Call    Medications: Outpatient Encounter Medications as of 04/24/2022  Medication Sig   acetaminophen (TYLENOL) 500 MG tablet Take 1,000 mg by mouth every 6 (six) hours as needed for moderate pain.   Ascorbic Acid (VITAMIN C PO) Take by mouth.   baclofen (LIORESAL) 10 MG tablet Take 1 tablet (10 mg total) by mouth 3 (three) times daily as needed.   FARXIGA 10 MG TABS tablet Take 1 tablet (10 mg total) by mouth daily. (Patient not taking: Reported on 04/17/2022)   FIBER PO Take 1 tablet by mouth daily.   fluconazole (DIFLUCAN) 150 MG tablet Take 1 tablet (150 mg total) by mouth daily.   fluticasone (FLONASE) 50 MCG/ACT nasal spray Place 1 spray into both nostrils daily.   hydrochlorothiazide (HYDRODIURIL) 12.5 MG tablet TAKE 1 TABLET DAILY ALONG WITH ONE OF THE LOSARTAN 50 MG TABLETS   levothyroxine (SYNTHROID) 125 MCG tablet TAKE 1 TABLET BY MOUTH DAILY  BEFORE BREAKFAST   losartan (COZAAR) 50 MG tablet TAKE 1 TABLET DAILY ALONG WITH 1 OF THE HCTZ 12.$RemoveBef'5MG'RlVpCwOdnt$  TABLETS   metFORMIN (GLUCOPHAGE-XR) 500 MG 24 hr tablet Take 4 tablets (2,000 mg total) by mouth daily with breakfast.   MONISTAT 3 COMBINATION PACK 200 & 2 MG-% (9GM) KIT    montelukast (SINGULAIR) 10 MG tablet Take 1 tablet (10 mg total) by mouth at bedtime.   mycophenolate (CELLCEPT) 500 MG tablet Take $RemoveBef'1500mg'cAPGzbFaZL$  twice a day   NON FORMULARY Place 4 L into the nose daily. 10 Liters with exertion   nystatin cream (MYCOSTATIN) Apply 1 application topically 3 (three) times daily.   OneTouch Delica Lancets 89Q MISC CHECK BLOOD SUGAR ONCE DAILY.   ONETOUCH ULTRA test strip CHECK BLOOD SUGAR DAILY.   pantoprazole (PROTONIX) 40 MG tablet TAKE 1 TABLET BY MOUTH TWICE  DAILY   pioglitazone (ACTOS) 45 MG tablet Take 1 tablet (45 mg total) by mouth daily.   Pirfenidone 801 MG TABS TAKE 1 TABLET ($RemoveB'801MG'AOikUlsj$ ) BY MOUTH  THREE  TIMES DAILY WITH FOOD   Probiotic Product (PROBIOTIC DAILY PO) Take by mouth.   rosuvastatin (CRESTOR) 10 MG tablet Take 1 tablet (10 mg total) by mouth daily.   triamcinolone cream (KENALOG) 0.1 % Apply 1 Application topically 2 (two) times daily.   verapamil (CALAN-SR) 180 MG CR tablet TAKE 1 TABLET BY MOUTH DAILY   zaleplon (SONATA) 5 MG capsule Take 1 capsule (5 mg total) by mouth at bedtime as needed for sleep.   No facility-administered encounter medications on file as of 04/24/2022.    BRENTLEY HORRELL was contacted to remind of upcoming telephone visit with Charlene Brooke on 04/29/22 at 8:45. Patient was reminded to have any blood glucose and blood pressure readings available for review at appointment.   Patient confirmed appointment.  Are you having any problems with your medications? No   Do you have any concerns you like to discuss with the pharmacist? No  CCM referral has been placed prior to visit?  Yes   Star Rating Drugs: Medication:  Last Fill: Day Supply Farxiga $RemoveBefore'10mg'oSEDaAdHtrOop$   04/07/22 30 Losartan $RemoveBef'50mg'YHbSRoNeDZ$  03/04/22 90 Metformin xr $RemoveBefo'500mg'lFeuvtEfmKs$  03/17/22 90 Pioglitazone $RemoveBeforeD'45mg'XZyRqPsqyeMDLi$  02/24/22  90 Rosuvastatin $RemoveBeforeD'10mg'imYdhHcIQvuCBr$  01/27/22 [place], CPP notified  Avel Sensor, [place]  [phone]

## 2022-04-24 NOTE — Telephone Encounter (Signed)
Last office visit 04/08/22 for vaginal itching.  Last refilled 02/12/22 for #30 with no refills.  Next Appt: 06/06/22 for DM.

## 2022-04-24 NOTE — Telephone Encounter (Signed)
Called and spoke with [name].  At this time [facility] has no openings for 04/25/22. [name] is aware.  Labs and CXR orders placed.  Lab and CXR available times given.  Nothing further at this time.

## 2022-04-24 NOTE — Telephone Encounter (Signed)
I called and spoke with the daughter.In addition to below she is still having insomnia, more fatigue. Saturations are lower with swollen feet but she may be dehydrated as well. Daughter is not certain about the accuracy of o2 sat readings. She has an echo pending for pulm htn, CHF eval pending and I would like to give her lasix but want to make sure kidney function is ok. I had tried prednisone earlier this year but had to stop due to side effects so am reluctant to try it,   I asked her to come in for either acute visit tomorrow or if it is not available then get labs (pro BNP for dyspnea, CBC with diff and CMP) and CXR tomorrow. Advise to go to ED if situation gets worse.

## 2022-04-25 ENCOUNTER — Ambulatory Visit (INDEPENDENT_AMBULATORY_CARE_PROVIDER_SITE_OTHER): Payer: Medicare Other

## 2022-04-25 ENCOUNTER — Other Ambulatory Visit (INDEPENDENT_AMBULATORY_CARE_PROVIDER_SITE_OTHER): Payer: Medicare Other

## 2022-04-25 DIAGNOSIS — R0602 Shortness of breath: Secondary | ICD-10-CM

## 2022-04-25 DIAGNOSIS — R079 Chest pain, unspecified: Secondary | ICD-10-CM | POA: Diagnosis not present

## 2022-04-25 LAB — COMPREHENSIVE METABOLIC PANEL
ALT: 7 U/L (ref 0–35)
AST: 10 U/L (ref 0–37)
Albumin: 4.3 g/dL (ref 3.5–5.2)
Alkaline Phosphatase: 63 U/L (ref 39–117)
BUN: 14 mg/dL (ref 6–23)
CO2: 38 mEq/L — ABNORMAL HIGH (ref 19–32)
Calcium: 10.1 mg/dL (ref 8.4–10.5)
Chloride: 95 mEq/L — ABNORMAL LOW (ref 96–112)
Creatinine, Ser: 0.57 mg/dL (ref 0.40–1.20)
GFR: 91.44 mL/min (ref >60.00)
Glucose, Bld: 176 mg/dL — ABNORMAL HIGH (ref 70–99)
Potassium: 3.9 mEq/L (ref 3.5–5.1)
Sodium: 140 mEq/L (ref 135–145)
Total Bilirubin: 0.4 mg/dL (ref 0.2–1.2)
Total Protein: 7.2 g/dL (ref 6.0–8.3)

## 2022-04-25 LAB — CBC WITH DIFFERENTIAL/PLATELET
Basophils Absolute: 0.1 10*3/uL (ref 0.0–0.1)
Basophils Relative: 0.7 % (ref 0.0–3.0)
Eosinophils Absolute: 0.2 10*3/uL (ref 0.0–0.7)
Eosinophils Relative: 2.4 % (ref 0.0–5.0)
HCT: 37.1 % (ref 36.0–46.0)
Hemoglobin: 11.5 g/dL — ABNORMAL LOW (ref 12.0–15.0)
Lymphocytes Relative: 15.3 % (ref 12.0–46.0)
Lymphs Abs: 1.3 10*3/uL (ref 0.7–4.0)
MCHC: 31.1 g/dL (ref 30.0–36.0)
MCV: 95.9 fl (ref 78.0–100.0)
Monocytes Absolute: 0.5 10*3/uL (ref 0.1–1.0)
Monocytes Relative: 6.6 % (ref 3.0–12.0)
Neutro Abs: 6.2 10*3/uL (ref 1.4–7.7)
Neutrophils Relative %: 75 % (ref 43.0–77.0)
Platelets: 246 10*3/uL (ref 150.0–400.0)
RBC: 3.86 Mil/uL — ABNORMAL LOW (ref 3.87–5.11)
RDW: 14.4 % (ref 11.5–15.5)
WBC: 8.2 10*3/uL (ref 4.0–10.5)

## 2022-04-26 LAB — PRO B NATRIURETIC PEPTIDE: NT-Pro BNP: 51 pg/mL (ref 0–301)

## 2022-04-28 ENCOUNTER — Telehealth: Payer: Self-pay | Admitting: Pulmonary Disease

## 2022-04-28 MED ORDER — FUROSEMIDE 20 MG PO TABS: 20.0000 mg | ORAL_TABLET | Freq: Every day | ORAL | 0 refills | Status: DC

## 2022-04-28 MED ORDER — AZITHROMYCIN 250 MG PO TABS: 250.0000 mg | ORAL_TABLET | Freq: Every day | ORAL | 0 refills | Status: DC

## 2022-04-28 NOTE — Telephone Encounter (Signed)
I called and spoke with patient and discussed results Labs look okay.  Checks x-ray shows mild interstitial prominence We discussed steroids but Kaitlin Hamilton would like to avoid given issues with side effects before I will call back in a few days to reassess condition.  Can you call in Z-Pak and Lasix 20 mg a day

## 2022-04-28 NOTE — Telephone Encounter (Signed)
Spoke with [name] (pt's daughter per [facility] who states lower extremity swelling nor SOB has improved. [name] stated that labs and CXR had resulted in My Chart and pt would like interpretation of those. [name] states pt says her O2 saturation drops to 80s right after exertion but is currently around 99% on 10 L of O2. Dr. Vaughan Browner please advise on situation, labs and CXR. Thank you

## 2022-04-28 NOTE — Telephone Encounter (Signed)
Called and spoke with patient. She verified pharmacy.   Nothing further needed.

## 2022-04-29 ENCOUNTER — Telehealth: Payer: Self-pay | Admitting: *Deleted

## 2022-04-29 ENCOUNTER — Ambulatory Visit: Payer: Medicare Other | Admitting: Pharmacist

## 2022-04-29 DIAGNOSIS — E118 Type 2 diabetes mellitus with unspecified complications: Secondary | ICD-10-CM

## 2022-04-29 DIAGNOSIS — J9611 Chronic respiratory failure with hypoxia: Secondary | ICD-10-CM

## 2022-04-29 DIAGNOSIS — E1169 Type 2 diabetes mellitus with other specified complication: Secondary | ICD-10-CM

## 2022-04-29 DIAGNOSIS — E1159 Type 2 diabetes mellitus with other circulatory complications: Secondary | ICD-10-CM

## 2022-04-29 DIAGNOSIS — I152 Hypertension secondary to endocrine disorders: Secondary | ICD-10-CM

## 2022-04-29 NOTE — Progress Notes (Signed)
Chronic Care Management Pharmacy Note  04/29/2022 Name:  Kaitlin Hamilton MRN:  [id number] DOB:  1951/05/18  Summary: CCM F/U visit -HLD: LDL improved to 73 on rosuvastatin. -DM: A1c 6.9% (02/2022); pt is currently off of Farxiga due to mycotic infection, she does not want to restart this; she is on metformin and actos. She has been dealing with increased swelling and shortness of breath recently (which may be related to holding [place]) - she started Lasix yesterday with some improvement in swelling. I am concerned Actos may be contributing to swelling as well. She has ECHO next week to check for heart failure - if she has any degree of heart failure Actos should be discontinued  Recommendations/Changes made from today's visit: -Follow up ECHO results - recommend to discontinue pioglitiazone if heart failure is present  Plan: -Pharmacist follow up televisit scheduled for 2 weeks -ECHO 05/08/22 -PCP 06/06/22     Subjective: Kaitlin Hamilton is an 71 y.o. year old female who is a primary patient of Bedsole, Amy E, MD.  The CCM team was consulted for assistance with disease management and care coordination needs.    Engaged with patient by telephone for follow up visit in response to provider referral for pharmacy case management and/or care coordination services.   Consent to Services:  The patient was given information about Chronic Care Management services, agreed to services, and gave verbal consent prior to initiation of services.  Please see initial visit note for detailed documentation.   Patient Care Team: [name], MD as PCP - General (Family Medicine) Juanito Doom, MD as Consulting Physician (Pulmonary Disease) Charlton Haws, [facility] as Pharmacist (Pharmacist)  Recent office visits: 04/10/22 pt message: rx triamcinolone cream as wet prep did not show yeast.  04/08/22 Dr [name] OV: vaginal itching; rx fluconazole daily, zaleplon for sleep. Hold [place].  04/03/22 Dr  Silvio Pate OV: acute cystitis - rx cefuroxime, fluconazole, miconazole.   03/06/22 Dr [name] OV: f/u - insomnia uncontrolled. Discuss with pulm. Stop soda and decrease bread.  09/05/21 Dr [name] OV: AWV - LDL worsened. Consider changing statin - pt did not respond to message.  Recent consult visits: 04/28/22 Pulmonary TE: labs wnl. Rx z-pak and lasix 20 mg. 04/17/22 Dr Vaughan Browner VV: UIP - order CBC and CMP - primary care. 04/07/22 pulmonary TE - hold prednisone in s/o yeast infection 03/18/22 pulmonary TE - reduce prednisone to 10 mg.  02/14/22 Dr Vaughan Browner (Pulmonary): UIP - increase cellcept to 1.5g BID and add prednisone 20 mg.   11/08/21 Dr Vaughan Browner (Pulmonary): UIP - schedule sleep study. Pt declined sleep study. 42-monthCT and PFT. 04/15/21 Dr MVaughan Browner(Pulmonary): f/u UIP - no changes. F/u 6 months.  Hospital visits: None in previous 6 months   Objective:  Lab Results  Component Value Date   CREATININE 0.57 04/25/2022   BUN 14 04/25/2022   GFR 91.44 04/25/2022   GFRNONAA >60 07/17/2019   GFRAA >60 07/17/2019   NA 140 04/25/2022   K 3.9 04/25/2022   CALCIUM 10.1 04/25/2022   CO2 38 (H) 04/25/2022   GLUCOSE 176 (H) 04/25/2022    Lab Results  Component Value Date/Time   HGBA1C 6.9 (H) 02/27/2022 09:08 AM   HGBA1C 6.6 (H) 09/05/2021 11:35 AM   GFR 91.44 04/25/2022 09:56 AM   GFR 89.70 02/27/2022 09:08 AM   MICROALBUR 5.1 (H) 11/08/2013 08:58 AM    Last diabetic Eye exam:  Lab Results  Component Value Date/Time   HMDIABEYEEXA No  Retinopathy 04/17/2020 12:00 AM    Last diabetic Foot exam:  Lab Results  Component Value Date/Time   HMDIABFOOTEX done 09/05/2021 12:00 AM     Lab Results  Component Value Date   CHOL 215 (H) 02/27/2022   HDL 106.60 02/27/2022   LDLCALC 73 02/27/2022   LDLDIRECT 104.0 09/04/2017   TRIG 178.0 (H) 02/27/2022   CHOLHDL 2 02/27/2022       Latest Ref Rng & Units 04/25/2022    9:56 AM 02/27/2022    9:08 AM 02/14/2022   11:58 AM  Hepatic Function   Total Protein 6.0 - 8.3 g/dL 7.2  7.8  7.9   Albumin 3.5 - 5.2 g/dL 4.3  4.7  4.6   AST 0 - 37 U/L '10  13  12   ' ALT 0 - 35 U/L '7  10  9   ' Alk Phosphatase 39 - 117 U/L 63  63  61   Total Bilirubin 0.2 - 1.2 mg/dL 0.4  0.4  0.3     Lab Results  Component Value Date/Time   TSH 2.06 11/08/2021 12:51 PM   TSH 1.36 09/05/2021 11:35 AM   FREET4 1.17 09/05/2021 11:35 AM   FREET4 1.16 07/26/2020 11:18 AM       Latest Ref Rng & Units 04/25/2022    9:56 AM 02/14/2022   11:58 AM 11/08/2021   12:51 PM  CBC  WBC 4.0 - 10.5 K/uL 8.2  8.3  9.2   Hemoglobin 12.0 - 15.0 g/dL 11.5  12.5  12.3   Hematocrit 36.0 - 46.0 % 37.1  39.9  39.1   Platelets 150.0 - 400.0 K/uL 246.0  266.0  261.0     No results found for: "VD25OH"  Clinical ASCVD: No  The ASCVD Risk score (Arnett DK, et al., 2019) failed to calculate for the following reasons:   The valid HDL cholesterol range is 20 to 100 mg/dL       09/05/2021   11:18 AM 09/05/2021   10:23 AM 08/21/2020    4:08 PM  Depression screen PHQ 2/9  Decreased Interest '1 1 1  ' Down, Depressed, Hopeless 0 0 1  PHQ - 2 Score '1 1 2  ' Altered sleeping '3 3 3  ' Tired, decreased energy '3 3 2  ' Change in appetite '3 3 2  ' Feeling bad or failure about yourself  1 1 0  Trouble concentrating 1 1 0  Moving slowly or fidgety/restless 0 0 0  Suicidal thoughts 0 0 0  PHQ-9 Score '12 12 9  ' Difficult doing work/chores   Somewhat difficult    Social History   Tobacco Use  Smoking Status Never   Passive exposure: Past  Smokeless Tobacco Never   BP Readings from Last 3 Encounters:  04/08/22 112/60  04/03/22 124/64  03/06/22 128/66   Pulse Readings from Last 3 Encounters:  04/08/22 91  04/03/22 97  03/06/22 87   Wt Readings from Last 3 Encounters:  04/08/22 220 lb 8 oz (100 kg)  04/03/22 218 lb (98.9 kg)  03/06/22 217 lb 4 oz (98.5 kg)   BMI Readings from Last 3 Encounters:  04/08/22 36.14 kg/m  04/03/22 35.73 kg/m  03/06/22 35.60 kg/m     Assessment/Interventions: Review of patient past medical history, allergies, medications, health status, including review of consultants reports, laboratory and other test data, was performed as part of comprehensive evaluation and provision of chronic care management services.   SDOH:  (Social Determinants of Health) assessments and interventions performed: Yes  SDOH Interventions    Flowsheet Row Chronic Care Management from 10/29/2021 in [place] at [facility] Visit from 09/05/2021 in [place] at [place] Management from 10/03/2020 in [place] at [facility] Visit from 01/24/2020 in [place] at [place] from 07/12/2019 in [place] at [place] Interventions Intervention Not Indicated -- -- -- --  Depression Interventions/Treatment  -- Medication, Counseling -- Patient refuses Treatment PHQ2-9 Score <4 Follow-up Not Indicated  Financial Strain Interventions Intervention Not Indicated -- Intervention Not Indicated  [Medications affordable] -- --      SDOH Screenings   Food Insecurity: No Food Insecurity (11/07/2021)  Housing: Low Risk  (07/12/2019)  Transportation Needs: No Transportation Needs (07/12/2019)  Alcohol Screen: Low Risk  (07/12/2019)  Depression (PHQ2-9): High Risk (09/05/2021)  Financial Resource Strain: Low Risk  (11/07/2021)  Physical Activity: Inactive (07/12/2019)  Stress: No Stress Concern Present (07/12/2019)  Tobacco Use: Low Risk  (04/17/2022)    [place]  Allergies  Allergen Reactions   Adhesive [Tape]     Redness, bruising with any adhesives- bandaids, patches, etc.    Atovaquone Itching   Codeine Hives and Swelling   Demerol [Meperidine] Hives and Swelling   Hydrocodone Nausea Only   Trazodone And Nefazodone Itching   Sulfa Antibiotics Rash    Medications Reviewed Today     Reviewed by Charlton Haws, [facility] (Pharmacist) on 04/29/22 at [place] List Status: <None>   Medication Order Taking? Sig Documenting Provider Last Dose Status Informant  acetaminophen (TYLENOL) 500 MG tablet [id number] Yes Take 1,000 mg by mouth every 6 (six) hours as needed for moderate pain. [provider] Taking Active Family Member  Ascorbic Acid (VITAMIN C PO) [id number] Yes Take by mouth. [provider] Taking Active Self  azithromycin (ZITHROMAX) 250 MG tablet [id number] Yes Take 1 tablet (250 mg total) by mouth daily. [name], MD Taking Active   baclofen (LIORESAL) 10 MG tablet [id number] Yes Take 1 tablet (10 mg total) by mouth 3 (three) times daily as needed. [name], MD Taking Active   FIBER PO [id number] Yes Take 1 tablet by mouth daily. [provider] Taking Active Family Member  fluconazole (DIFLUCAN) 150 MG tablet [id number] Yes Take 1 tablet (150 mg total) by mouth daily. [name], MD Taking Active   fluticasone (FLONASE) 50 MCG/ACT nasal spray [id number] Yes Place 1 spray into both nostrils daily. [provider] Taking Active   furosemide (LASIX) 20 MG tablet [id number] Yes Take 1 tablet (20 mg total) by mouth daily. [name], MD Taking Active   hydrochlorothiazide (HYDRODIURIL) 12.5 MG tablet [id number] Yes TAKE 1 TABLET DAILY ALONG WITH ONE OF THE LOSARTAN 50 MG TABLETS Bedsole, Amy E, MD Taking Active   levothyroxine (SYNTHROID) 125 MCG tablet [id number] Yes TAKE 1 TABLET BY MOUTH DAILY  BEFORE BREAKFAST Bedsole, Amy E, MD Taking Active   losartan (COZAAR) 50 MG tablet [id number] Yes TAKE 1 TABLET DAILY ALONG WITH 1 OF THE HCTZ 12.5MG TABLETS Bedsole, Amy E, MD Taking Active   metFORMIN (GLUCOPHAGE-XR) 500 MG 24 hr tablet [id number] Yes Take 4 tablets (2,000 mg total) by mouth daily with breakfast. Bedsole, Amy E, MD Taking Active   MONISTAT 3 COMBINATION PACK 200 & 2 MG-% (9GM) KIT [id number] Yes  [provider] Taking Active    montelukast (SINGULAIR) 10 MG tablet [id number] Yes  Take 1 tablet (10 mg total) by mouth at bedtime. [name], MD Taking Active   mycophenolate (CELLCEPT) 500 MG tablet [id number] Yes Take 1563m twice a day MMarshell Garfinkel MD Taking Active   NON FORMULARY [id number] Place 4 L into the nose daily. 10 Liters with exertion [provider] Taking Active Family Member  nystatin cream (MYCOSTATIN) [id number] Apply 1 application topically 3 (three) times daily. BJinny Sanders MD Taking Active   OneTouch Delica Lancets [id number] CHECK BLOOD SUGAR ONCE DAILY. BJinny Sanders MD Taking Active   [facility] test strip [id number] CHECK BLOOD SUGAR DAILY. BJinny Sanders MD Taking Active   pantoprazole (PROTONIX) 40 MG tablet [id number] TAKE 1 TABLET BY MOUTH TWICE  DAILY Bedsole, Amy E, MD Taking Active   pioglitazone (ACTOS) 45 MG tablet [id number] Take 1 tablet (45 mg total) by mouth daily. BJinny Sanders MD Taking Active   Pirfenidone 801 MG TABS [id number] TAKE 1 TABLET (801MG) BY MOUTH  THREE TIMES DAILY WITH FOOD Mannam, Praveen, MD Taking Active   Probiotic Product (PROBIOTIC DAILY PO) [id number] Take by mouth. [provider] Taking Active Self  rosuvastatin (CRESTOR) 10 MG tablet [id number] Take 1 tablet (10 mg total) by mouth daily. BJinny Sanders MD Taking Active   triamcinolone cream (KENALOG) 0.1 % [id number] Apply 1 Application topically 2 (two) times daily. BJinny Sanders MD Taking Active   verapamil (CALAN-SR) 180 MG CR tablet [id number] TAKE 1 TABLET BY MOUTH DAILY Bedsole, Amy E, MD Taking Active   zaleplon (SONATA) 5 MG capsule [id number] Take 1 capsule (5 mg total) by mouth at bedtime as needed for sleep. BJinny Sanders MD Taking Active             Patient Active Problem List   Diagnosis Date Noted   Acute cystitis without hematuria 04/03/2022   Neck pain on left side 03/05/2021   Lumbar back pain with  radiculopathy affecting left lower extremity 01/23/2021   Urinary frequency 03/16/2019   Vaginal itching 03/16/2019   Nausea vomiting and diarrhea 02/21/2019   Controlled diabetes mellitus type 2 with complications ([place] 116/55/3748  MDD (recurrent major depressive disorder) in remission (HCenter 12/08/2017   Chronic insomnia 12/08/2017   UTI due to Klebsiella species 06/22/2017   Candidal intertrigo 07/08/2016   Right sciatic nerve pain 03/09/2015   Chronic pain of left thumb 03/09/2015   Allergic rhinitis 10/06/2014   Chronic hypoxemic respiratory failure ([facility] 01/31/2014   Cystocele 11/11/2013   Hypertension associated with diabetes ([facility] 08/25/2013   Hyperlipidemia associated with type 2 diabetes mellitus (HB[place] 08/25/2013   Common migraine 08/25/2013   Hypothyroidism 08/25/2013   OSA (obstructive sleep apnea) 08/22/2013   GERD (gastroesophageal reflux disease) 06/01/2013   Esophageal dysmotility 05/17/2013   UIP (usual interstitial pneumonitis) (H[facility] 10/19/2012    Immunization History  Administered Date(s) Administered   Fluad Quad(high Dose 65+) 04/03/2020, 04/15/2021   Influenza Split 05/02/2005, 11/28/2014   Influenza Whole 04/20/2012   Influenza, High Dose Seasonal PF 04/16/2018   Influenza,inj,Quad PF,6+ Mos 05/17/2013, 04/04/2014, 10/06/2014, 05/11/2015, 10/19/2015, 03/12/2016, 04/30/2017, 10/14/2017, 03/16/2019   Influenza-Unspecified 04/20/2013   Moderna Covid-19 Vaccine Bivalent Booster 160yr& up 04/29/2021   Moderna Sars-Covid-2 Vaccination 08/20/2019, 09/19/2019, 05/17/2020   Pneumococcal Conjugate-13 04/04/2014   Pneumococcal Polysaccharide-23 04/20/2013, 04/16/2018   Tdap 07/21/2009    Conditions to be addressed/monitored:  Hypertension, Hyperlipidemia, and Diabetes, UIP, insomnia  Care Plan : [place]  Updates made by Charlton Haws, RPH since 05/01/2022 12:00 AM     Problem: Hypertension, Hyperlipidemia, and Diabetes, UIP, insomnia    Priority: High     Long-Range Goal: Disease Management   Start Date: 10/03/2020  Expected End Date: 11/08/2022  This Visit's Progress: On track  Recent Progress: On track  Priority: High  Note:   Current Barriers:  Suboptimal therapeutic regimen for DM  Pharmacist Clinical Goal(s):  Patient will adhere to plan to optimize therapeutic regimen for DM as evidenced by report of adherence to recommended medication management changes through collaboration with PharmD and provider.   Interventions: 1:1 collaboration with [name], MD regarding development and update of comprehensive plan of care as evidenced by provider attestation and co-signature Inter-disciplinary care team collaboration (see longitudinal plan of care) Comprehensive medication review performed; medication list updated in electronic medical record  Hyperlipidemia (LDL goal < 100) -Controlled - LDL 73 (02/2022), improved after switching to rosuvastatin -Current treatment: Rosuvastatin 10 mg daily - Appropriate, Effective, Safe, Accessible -Medications previously tried: Atorvastatin (muscle pain) -Educated on Cholesterol goals; Benefits of statin for ASCVD risk reduction; -Recommend to continue current medication  Hypertension (BP goal <140/90) -Controlled - via clinic readings; Denies hypotensive/hypertensive symptoms -Current treatment: HCTZ 12.5 mg daily - Appropriate, Effective, Safe, Accessible Verapamil ER 180 mg daily -Appropriate, Effective, Safe, Accessible Losartan 50 mg daily -Appropriate, Effective, Safe, Accessible Furosemide -Medications previously tried: none  -Monitor BP at home periodically -Recommended to continue current medication  Diabetes (A1c goal <7%) -Query Controlled - A1c 6.9% (02/2022), Wilder Glade has been on hold since 9/19 however due to mycotic infection; pt has been dealing with increased swelling and shortness of breath recently, which may be related to holding [place]; she started  Lasix yesterday -Current home glucose readings - checking daily fasting glucose: 195, 135, 149, 149, 169, 199 -Current medications: Farxiga 10 mg daily - on hold since 9/19 Metformin 54m ER - 4 tab AM -Appropriate, Effective, Safe, Accessible Pioglitazone 45 mg daily -Appropriate, Effective, Query Safe -Medications previously tried: none  -Current meal patterns: 1 meal a day. Usually limits meats and eats some sort of vegetable such as corn, sweet potatoes or butter beans.  -Current exercise: minimal due to lung condition -Reviewed potential issues with pioglitazone - pt has been having issues with feet swelling recently (stopping FWilder Gladelikely is contributing), but swelling is a side effect of Actos and pt has ECHO next week due to worsening SOB; if she has heart failure would recommend to stop Actos - will f/u results of ECHO. Would consider Januvia or Rybelsus if further DM control is needed  UIP (usual interstitial pneumonitis) (Goal: manage symptoms) -Controlled -Hx OSA, wearing CPAP nightly; follows with pulmonology -Current treatment  Pirfenidone 801 mg TID -Appropriate, Effective, Safe, Accessible Mycophenolate 500 mg - 3 tab BID -Appropriate, Effective, Safe, Accessible Oxygen -Medications previously tried: n/a  -Recommended to continue current medication  Depression / Insomnia (Goal: manage symptoms) -Not ideally controlled - stopped taking nortriptyline due to hangover effect in morning, even though it was not really helping her fall asleep. Pt is not interested in trying a new medication at this point.  -PHQ9: 12 (08/2021) - moderate depression -Current treatment  Zaleplon 5 mg HS prn - Appropriate, Effective, Safe, Accessible -Medications previously tried: trazodone (SE), nortriptyline  -Recommend to continue current medication  Patient Goals/Self-Care Activities Patient will:  - take medications as prescribed as evidenced by patient report and  record review focus on  medication adherence by pill box check glucose daily, document, and provide at future appointments check blood pressure periodically, document, and provide at future appointments        Medication Assistance: None required.  Patient affirms current coverage meets needs.  Compliance/Adherence/Medication fill history: Care Gaps: DEXA due 06/29/19 Eye exam due 04/17/21  Star-Rating Drugs: Farxiga - PDC 100% Losartan - PDC 100% Pioglitazone - PDC 99% Metformin - PDC 100% Pravastatin - PDC 100%  Medication Access: Within the past 30 days, how often has patient missed a dose of medication? 0 Is a pillbox or other method used to improve adherence? Yes  Factors that may affect medication adherence? no barriers identified Are meds synced by current pharmacy? No  Are meds delivered by current pharmacy? No  Does patient experience delays in picking up medications due to transportation concerns? No   [facility]stream Services Reviewed: Is patient disadvantaged to use [facility]?: No  Current Rx insurance plan: [facility] Name and location of Current pharmacy:  [place], [place] [address] Phone: [phone] Fax: [phone]  [facility] - [place], [place] [address] [place] [address] Phone: [phone] Fax: [phone]  [place], [place] [address] Phone: [phone] Fax: [phone]  [facility] services reviewed with patient today?: No  Patient requests to transfer care to [facility] Pharmacy?: No  Reason patient declined to change pharmacies: Loyalty to other pharmacy/Patient preference   Care Plan and Follow Up Patient Decision:  Patient agrees to Care Plan and Follow-up.  Plan: Telephone follow up appointment with care management team member scheduled for:  2  weeks  Charlene Brooke, PharmD, [facility] Clinical Pharmacist [facility] at [facility] [phone]

## 2022-04-29 NOTE — Patient Outreach (Signed)
  Care Coordination   Initial Visit Note   04/29/2022 Name: Kaitlin Hamilton MRN: [id number] DOB: 01-Dec-1950  Kaitlin Hamilton is a 71 y.o. year old female who sees [name], MD for primary care. I spoke with  [name] by phone today.  What matters to the patients health and wellness today?  No health concerns voiced. RN discussed [place], RN, SW, and Pharmacist. Patient declined services.     Goals Addressed             This Visit's Progress    Patient advised to follow up on vaccines          SDOH assessments and interventions completed:  Yes     Care Coordination Interventions Activated:  Yes  Care Coordination Interventions:  Yes, provided   Follow up plan: No further intervention required.   Encounter Outcome:  Pt. [place] Care Management [phone]

## 2022-04-30 ENCOUNTER — Other Ambulatory Visit: Payer: Self-pay | Admitting: Family Medicine

## 2022-04-30 NOTE — Telephone Encounter (Signed)
Last office visit 04/08/22 for Vaginal Pain.  Last refilled 04/11/22 for 30 g with no refills.  Next Appt: 06/06/22 for DM.

## 2022-05-01 NOTE — Patient Instructions (Signed)
Visit Information  Phone number for Pharmacist: [phone]   Goals Addressed   None     Care Plan : [place]  Updates made by Charlton Haws, RPH since 05/01/2022 12:00 AM     Problem: Hypertension, Hyperlipidemia, and Diabetes, UIP, insomnia   Priority: High     Long-Range Goal: Disease Management   Start Date: 10/03/2020  Expected End Date: 11/08/2022  This Visit's Progress: On track  Recent Progress: On track  Priority: High  Note:   Current Barriers:  Suboptimal therapeutic regimen for DM  Pharmacist Clinical Goal(s):  Patient will adhere to plan to optimize therapeutic regimen for DM as evidenced by report of adherence to recommended medication management changes through collaboration with PharmD and provider.   Interventions: 1:1 collaboration with [name], MD regarding development and update of comprehensive plan of care as evidenced by provider attestation and co-signature Inter-disciplinary care team collaboration (see longitudinal plan of care) Comprehensive medication review performed; medication list updated in electronic medical record  Hyperlipidemia (LDL goal < 100) -Controlled - LDL 73 (02/2022), improved after switching to rosuvastatin -Current treatment: Rosuvastatin 10 mg daily - Appropriate, Effective, Safe, Accessible -Medications previously tried: Atorvastatin (muscle pain) -Educated on Cholesterol goals; Benefits of statin for ASCVD risk reduction; -Recommend to continue current medication  Hypertension (BP goal <140/90) -Controlled - via clinic readings; Denies hypotensive/hypertensive symptoms -Current treatment: HCTZ 12.5 mg daily - Appropriate, Effective, Safe, Accessible Verapamil ER 180 mg daily -Appropriate, Effective, Safe, Accessible Losartan 50 mg daily -Appropriate, Effective, Safe, Accessible Furosemide -Medications previously tried: none  -Monitor BP at home periodically -Recommended to continue current  medication  Diabetes (A1c goal <7%) -Query Controlled - A1c 6.9% (02/2022), Wilder Glade has been on hold since 9/19 however due to mycotic infection; pt has been dealing with increased swelling and shortness of breath recently, which may be related to holding [place]; she started Lasix yesterday -Current home glucose readings - checking daily fasting glucose: 195, 135, 149, 149, 169, 199 -Current medications: Farxiga 10 mg daily - on hold since 9/19 Metformin '500mg'$  ER - 4 tab AM -Appropriate, Effective, Safe, Accessible Pioglitazone 45 mg daily -Appropriate, Effective, Query Safe -Medications previously tried: none  -Current meal patterns: 1 meal a day. Usually limits meats and eats some sort of vegetable such as corn, sweet potatoes or butter beans.  -Current exercise: minimal due to lung condition -Reviewed potential issues with pioglitazone - pt has been having issues with feet swelling recently (stopping Wilder Glade likely is contributing), but swelling is a side effect of Actos and pt has ECHO next week due to worsening SOB; if she has heart failure would recommend to stop Actos - will f/u results of ECHO. Would consider Januvia or Rybelsus if further DM control is needed  UIP (usual interstitial pneumonitis) (Goal: manage symptoms) -Controlled -Hx OSA, wearing CPAP nightly; follows with pulmonology -Current treatment  Pirfenidone 801 mg TID -Appropriate, Effective, Safe, Accessible Mycophenolate 500 mg - 3 tab BID -Appropriate, Effective, Safe, Accessible Oxygen -Medications previously tried: n/a  -Recommended to continue current medication  Depression / Insomnia (Goal: manage symptoms) -Not ideally controlled - stopped taking nortriptyline due to hangover effect in morning, even though it was not really helping her fall asleep. Pt is not interested in trying a new medication at this point.  -PHQ9: 12 (08/2021) - moderate depression -Current treatment  Zaleplon 5 mg HS prn - Appropriate,  Effective, Safe, Accessible -Medications previously tried: trazodone (SE), nortriptyline  -Recommend to continue current  medication  Patient Goals/Self-Care Activities Patient will:  - take medications as prescribed as evidenced by patient report and record review focus on medication adherence by pill box check glucose daily, document, and provide at future appointments check blood pressure periodically, document, and provide at future appointments       Patient verbalizes understanding of instructions and care plan provided today and agrees to view in [place]. Active MyChart status and patient understanding of how to access instructions and care plan via MyChart confirmed with patient.    Telephone follow up appointment with pharmacy team member scheduled for: 2 weeks  Charlene Brooke, PharmD, [facility] Clinical Pharmacist [facility] [phone]

## 2022-05-04 DIAGNOSIS — G4733 Obstructive sleep apnea (adult) (pediatric): Secondary | ICD-10-CM | POA: Diagnosis not present

## 2022-05-04 DIAGNOSIS — J8489 Other specified interstitial pulmonary diseases: Secondary | ICD-10-CM | POA: Diagnosis not present

## 2022-05-05 ENCOUNTER — Other Ambulatory Visit: Payer: Self-pay | Admitting: Family Medicine

## 2022-05-05 NOTE — Telephone Encounter (Signed)
Last office visit 04/08/22 for Vaginal Itching.  Last refilled 04/08/22 for #30 with no refills.  Next Appt: 06/06/22 for DM.

## 2022-05-08 ENCOUNTER — Ambulatory Visit: Payer: Medicare Other | Attending: Pulmonary Disease

## 2022-05-08 DIAGNOSIS — R0602 Shortness of breath: Secondary | ICD-10-CM | POA: Diagnosis not present

## 2022-05-08 LAB — ECHOCARDIOGRAM COMPLETE
AR max vel: 2.66 cm2
AV Area VTI: 2.93 cm2
AV Area mean vel: 2.21 cm2
AV Mean grad: 5 mmHg
AV Peak grad: 8.2 mmHg
Ao pk vel: 1.43 m/s
Area-P 1/2: 3.48 cm2

## 2022-05-08 MED ORDER — PERFLUTREN LIPID MICROSPHERE
1.0000 mL | INTRAVENOUS | Status: AC | PRN
  Administered 2022-05-08: 2 mL via INTRAVENOUS

## 2022-05-09 ENCOUNTER — Telehealth: Payer: Self-pay | Admitting: Pulmonary Disease

## 2022-05-09 NOTE — Telephone Encounter (Signed)
Called and spoke with Kaitlin Hamilton's daughter Kaitlin Hamilton letting her know that Dr. Matilde Bash first available appt was not until 11/9 and she said that was okay that they would take that appt. Kaitlin Hamilton said that she wants to have a visit with Kaitlin Hamilton to talk about next steps with Kaitlin Hamilton as she is wondering if it needs to be discussed about next steps in regards to end of life due to Kaitlin Hamilton's disease. Kaitlin Hamilton said that when Kaitlin Hamilton is wearing her oxygen, it can be a struggle. Kaitlin Hamilton does wear between 6-10L O2 and at times her sats are dropping to the 50s when Kaitlin Hamilton really exerts herself.   I did schedule a video visit for Kaitlin Hamilton and Kaitlin Hamilton with Kaitlin Hamilton on 11/9 so Kaitlin Hamilton could be able to discuss with Kaitlin Hamilton whatever questions she has for him.    While speaking with Kaitlin Hamilton, she wanted to know if another prednisone prescription could be sent to the pharmacy for Kaitlin Hamilton as she said that seems to help ease Kaitlin Hamilton when she is on it. She also said that Kaitlin Hamilton has been taking the lasix, not sure if it is really helping Kaitlin Hamilton's breathing but they are going to continue taking it at this time.  Kaitlin Hamilton, please advise on the prednisone if we can send an Rx in for Kaitlin Hamilton.

## 2022-05-12 MED ORDER — PREDNISONE 20 MG PO TABS: 20.0000 mg | ORAL_TABLET | Freq: Every day | ORAL | 1 refills | Status: DC

## 2022-05-12 NOTE — Telephone Encounter (Signed)
Patient's daughter calling to check on status of this. Please call daughter at [phone]

## 2022-05-12 NOTE — Telephone Encounter (Signed)
Please send in prescription for prednisone 20 mg a day.  I do not want to use a much higher dose as she had some side effects from it at last time.

## 2022-05-12 NOTE — Telephone Encounter (Signed)
I called and spoke with the pt's daughter and notified of response per Dr Vaughan Browner  She verbalized understanding  Rx was sent to pharm  Nothing further needed

## 2022-05-16 ENCOUNTER — Telehealth: Payer: Self-pay | Admitting: Family Medicine

## 2022-05-16 MED ORDER — GLIPIZIDE ER 10 MG PO TB24: 10.0000 mg | ORAL_TABLET | Freq: Every day | ORAL | 11 refills | Status: DC

## 2022-05-16 NOTE — Telephone Encounter (Signed)
Patient was taken off of farxiga about 6 wks ago,and she stated that her blood sugar has been high the last 3days in the 200's,she would like to know are is Dr [name]  going to prescribe her another blood sugar pill?please advise. She does have an appointment on 06/06/22,would you want her to come in before then or wait to be seen then? She stated that it has just been in the 220 range the last 3 days,but she's not feeling sick or anything. And that was the reading before she ate anything.

## 2022-05-16 NOTE — Telephone Encounter (Addendum)
Ms. Fabio notified as instructed by telephone.  She states her Lung doctor put her on a fluid pill for 30 days so currently she is not having any issues with peripheral swelling. She want to go back on the glipizide.  She is aware to take with food.  She also states she is still having a little discomfort on the left side of her vaginal area but nothing like it was.  She states she will continue the triamcinolone cream and Vaseline until she sees Dr. [name] on 06/06/22.   Pharmacy. [facility].

## 2022-05-22 ENCOUNTER — Other Ambulatory Visit: Payer: Self-pay | Admitting: Family Medicine

## 2022-05-28 ENCOUNTER — Ambulatory Visit: Payer: Medicare Other | Admitting: Pharmacist

## 2022-05-28 DIAGNOSIS — E118 Type 2 diabetes mellitus with unspecified complications: Secondary | ICD-10-CM

## 2022-05-28 DIAGNOSIS — J9611 Chronic respiratory failure with hypoxia: Secondary | ICD-10-CM

## 2022-05-28 DIAGNOSIS — E1159 Type 2 diabetes mellitus with other circulatory complications: Secondary | ICD-10-CM

## 2022-05-28 DIAGNOSIS — E1169 Type 2 diabetes mellitus with other specified complication: Secondary | ICD-10-CM

## 2022-05-28 NOTE — Progress Notes (Signed)
Chronic Care Management Pharmacy Note  05/28/2022 Name:  Kaitlin Hamilton MRN:  [id number] DOB:  02-16-1951  Summary: CCM F/U visit -HLD: LDL improved to 73 on rosuvastatin. -DM: A1c 6.9% (02/2022); but pt has stopped [place] since then; she had increased swelling and took Lasix for 1 month (now completed) and she reports swelling has not returned; previously concerned that Actos may be contributing to edema but as swelling is resolved it is reasonable to continue -ECHO 05/08/22 showed EF 60-65% with evidence of Grade I diastolic dysfunction -Started prednisone 20 mg per pulm 10/23  Recommendations/Changes made from today's visit: -Continue to monitor swelling; consider stopping pioglitazone if it returns  Plan: -Pharmacist follow up televisit scheduled for 2 months -Pulmonology appt 05/29/22 -PCP appt 06/06/22     Subjective: Kaitlin Hamilton is an 71 y.o. year old female who is a primary patient of Bedsole, Amy E, MD.  The CCM team was consulted for assistance with disease management and care coordination needs.    Engaged with patient by telephone for follow up visit in response to provider referral for pharmacy case management and/or care coordination services.   Consent to Services:  The patient was given information about Chronic Care Management services, agreed to services, and gave verbal consent prior to initiation of services.  Please see initial visit note for detailed documentation.   Patient Care Team: [name], MD as PCP - General (Family Medicine) Juanito Doom, MD as Consulting Physician (Pulmonary Disease) Charlton Haws, [facility] as Pharmacist (Pharmacist)  Recent office visits: 04/10/22 pt message: rx triamcinolone cream as wet prep did not show yeast.  04/08/22 Dr [name] OV: vaginal itching; rx fluconazole daily, zaleplon for sleep. Hold Iran.  04/03/22 Dr Silvio Pate OV: acute cystitis - rx cefuroxime, fluconazole, miconazole.   03/06/22 Dr [name] OV: f/u  - insomnia uncontrolled. Discuss with pulm. Stop soda and decrease bread.  09/05/21 Dr [name] OV: AWV - LDL worsened. Consider changing statin - pt did not respond to message.  Recent consult visits: 04/28/22 Pulmonary TE: labs wnl. Rx z-pak and lasix 20 mg. 04/17/22 Dr Vaughan Browner VV: UIP - order CBC and CMP - primary care. 04/07/22 pulmonary TE - hold prednisone in s/o yeast infection 03/18/22 pulmonary TE - reduce prednisone to 10 mg.  02/14/22 Dr Vaughan Browner (Pulmonary): UIP - increase cellcept to 1.5g BID and add prednisone 20 mg.   11/08/21 Dr Vaughan Browner (Pulmonary): UIP - schedule sleep study. Pt declined sleep study. 23-monthCT and PFT. 04/15/21 Dr MVaughan Browner(Pulmonary): f/u UIP - no changes. F/u 6 months.  Hospital visits: None in previous 6 months   Objective:  Lab Results  Component Value Date   CREATININE 0.57 04/25/2022   BUN 14 04/25/2022   GFR 91.44 04/25/2022   GFRNONAA >60 07/17/2019   GFRAA >60 07/17/2019   NA 140 04/25/2022   K 3.9 04/25/2022   CALCIUM 10.1 04/25/2022   CO2 38 (H) 04/25/2022   GLUCOSE 176 (H) 04/25/2022    Lab Results  Component Value Date/Time   HGBA1C 6.9 (H) 02/27/2022 09:08 AM   HGBA1C 6.6 (H) 09/05/2021 11:35 AM   GFR 91.44 04/25/2022 09:56 AM   GFR 89.70 02/27/2022 09:08 AM   MICROALBUR 5.1 (H) 11/08/2013 08:58 AM    Last diabetic Eye exam:  Lab Results  Component Value Date/Time   HMDIABEYEEXA No Retinopathy 04/17/2020 12:00 AM    Last diabetic Foot exam:  Lab Results  Component Value Date/Time   HMDIABFOOTEX done 09/05/2021 12:00  AM     Lab Results  Component Value Date   CHOL 215 (H) 02/27/2022   HDL 106.60 02/27/2022   LDLCALC 73 02/27/2022   LDLDIRECT 104.0 09/04/2017   TRIG 178.0 (H) 02/27/2022   CHOLHDL 2 02/27/2022       Latest Ref Rng & Units 04/25/2022    9:56 AM 02/27/2022    9:08 AM 02/14/2022   11:58 AM  Hepatic Function  Total Protein 6.0 - 8.3 g/dL 7.2  7.8  7.9   Albumin 3.5 - 5.2 g/dL 4.3  4.7  4.6   AST 0 - 37  U/L _0 ALT 0 - 35 U/L _1 Alk Phosphatase 39 - 117 U/L 63  63  61   Total Bilirubin 0.2 - 1.2 mg/dL 0.4  0.4  0.3     Lab Results  Component Value Date/Time   TSH 2.06 11/08/2021 12:51 PM   TSH 1.36 09/05/2021 11:35 AM   FREET4 1.17 09/05/2021 11:35 AM   FREET4 1.16 07/26/2020 11:18 AM       Latest Ref Rng & Units 04/25/2022    9:56 AM 02/14/2022   11:58 AM 11/08/2021   12:51 PM  CBC  WBC 4.0 - 10.5 K/uL 8.2  8.3  9.2   Hemoglobin 12.0 - 15.0 g/dL 11.5  12.5  12.3   Hematocrit 36.0 - 46.0 % 37.1  39.9  39.1   Platelets 150.0 - 400.0 K/uL 246.0  266.0  261.0     No results found for: "VD25OH"  Clinical ASCVD: No  The ASCVD Risk score (Arnett DK, et al., 2019) failed to calculate for the following reasons:   The valid HDL cholesterol range is 20 to 100 mg/dL       09/05/2021   11:18 AM 09/05/2021   10:23 AM 08/21/2020    4:08 PM  Depression screen PHQ 2/9  Decreased Interest _2 Down, Depressed, Hopeless 0 0 1  PHQ - 2 Score _3 Altered sleeping _4 Tired, decreased energy _5 Change in appetite _6 Feeling bad or failure about yourself  1 1 0  Trouble concentrating 1 1 0  Moving slowly or fidgety/restless 0 0 0  Suicidal thoughts 0 0 0  PHQ-9 Score _7 Difficult doing work/chores   Somewhat difficult    Social History   Tobacco Use  Smoking Status Never   Passive exposure: Past  Smokeless Tobacco Never   BP Readings from Last 3 Encounters:  04/08/22 112/60  04/03/22 124/64  03/06/22 128/66   Pulse Readings from Last 3 Encounters:  04/08/22 91  04/03/22 97  03/06/22 87   Wt Readings from Last 3 Encounters:  04/08/22 220 lb 8 oz (100 kg)  04/03/22 218 lb (98.9 kg)  03/06/22 217 lb 4 oz (98.5 kg)   BMI Readings from Last 3 Encounters:  04/08/22 36.14 kg/m  04/03/22 35.73 kg/m  03/06/22 35.60 kg/m    Assessment/Interventions: Review of patient past medical history, allergies, medications, health status,  including review of consultants reports, laboratory and other test data, was performed as part of comprehensive evaluation and provision of chronic care management services.   SDOH:  (Social Determinants of Health) assessments and interventions performed: Yes SDOH Interventions    Flowsheet Row Chronic Care Management from 10/29/2021 in [place] at [facility] Visit from 09/05/2021 in [place]  [facility] [place] Management from 10/03/2020 in [place] at [facility] Visit from 01/24/2020 in [place] at [place] from 07/12/2019 in [place] at [place] Interventions       Food Insecurity Interventions Intervention Not Indicated -- -- -- --  Depression Interventions/Treatment  -- Medication, Counseling -- Patient refuses Treatment PHQ2-9 Score <4 Follow-up Not Indicated  Financial Strain Interventions Intervention Not Indicated -- Intervention Not Indicated  [Medications affordable] -- --      [place]: No Food Insecurity (11/07/2021)  Housing: Low Risk  (07/12/2019)  Transportation Needs: No Transportation Needs (07/12/2019)  Alcohol Screen: Low Risk  (07/12/2019)  Depression (PHQ2-9): High Risk (09/05/2021)  Financial Resource Strain: Low Risk  (11/07/2021)  Physical Activity: Inactive (07/12/2019)  Stress: No Stress Concern Present (07/12/2019)  Tobacco Use: Low Risk  (04/17/2022)    [place]  Allergies  Allergen Reactions   Adhesive [Tape]     Redness, bruising with any adhesives- bandaids, patches, etc.    Atovaquone Itching   Codeine Hives and Swelling   Demerol [Meperidine] Hives and Swelling   Hydrocodone Nausea Only   Trazodone And Nefazodone Itching   Sulfa Antibiotics Rash    Medications Reviewed Today     Reviewed by Charlton Haws, [facility] (Pharmacist) on 05/28/22 at 1206  Med List Status: <None>   Medication Order Taking? Sig  Documenting Provider Last Dose Status Informant  acetaminophen (TYLENOL) 500 MG tablet [id number] Yes Take 1,000 mg by mouth every 6 (six) hours as needed for moderate pain. [provider] Taking Active Family Member  Ascorbic Acid (VITAMIN C PO) [id number] Yes Take by mouth. [provider] Taking Active Self  azithromycin (ZITHROMAX) 250 MG tablet [id number] Yes Take 1 tablet (250 mg total) by mouth daily. [name], MD Taking Active   baclofen (LIORESAL) 10 MG tablet [id number] Yes Take 1 tablet (10 mg total) by mouth 3 (three) times daily as needed. [name], MD Taking Active   FIBER PO [id number] Yes Take 1 tablet by mouth daily. [provider] Taking Active Family Member  fluconazole (DIFLUCAN) 150 MG tablet [id number] Yes Take 1 tablet (150 mg total) by mouth daily. [name], MD Taking Active   fluticasone (FLONASE) 50 MCG/ACT nasal spray [id number] Yes Place 1 spray into both nostrils daily. [provider] Taking Active   glipiZIDE (GLUCOTROL XL) 10 MG 24 hr tablet [id number] Yes Take 1 tablet (10 mg total) by mouth daily with breakfast. [name], Amy E, MD Taking Active   hydrochlorothiazide (HYDRODIURIL) 12.5 MG tablet [id number] Yes TAKE 1 TABLET DAILY ALONG WITH ONE OF THE LOSARTAN 50 MG TABLETS Bedsole, Amy E, MD Taking Active   levothyroxine (SYNTHROID) 125 MCG tablet [id number] Yes TAKE 1 TABLET BY MOUTH DAILY  BEFORE BREAKFAST Bedsole, Amy E, MD Taking Active   losartan (COZAAR) 50 MG tablet [id number] Yes TAKE 1 TABLET DAILY ALONG WITH 1 OF THE HCTZ 12.5MG TABLETS Bedsole, Amy E, MD Taking Active   metFORMIN (GLUCOPHAGE-XR) 500 MG 24 hr tablet [id number] Yes Take 4 tablets (2,000 mg total) by mouth daily with breakfast. Bedsole, Amy E, MD Taking Active   MONISTAT 3 COMBINATION PACK 200 & 2 MG-% (9GM) KIT [id number] Yes  [provider] Taking Active   montelukast (SINGULAIR) 10 MG tablet [id number] Yes Take 1 tablet (10 mg total) by  mouth at bedtime. [name], MD Taking Active  mycophenolate (CELLCEPT) 500 MG tablet [id number] Yes Take 1591m twice a day MMarshell Garfinkel MD Taking Active   NON FORMULARY [id number] Place 4 L into the nose daily. 10 Liters with exertion [provider] Taking Active Family Member  nystatin cream (MYCOSTATIN) [id number] Apply 1 application topically 3 (three) times daily. BJinny Sanders MD Taking Active   OneTouch Delica Lancets [id number] CHECK BLOOD SUGAR ONCE DAILY. BJinny Sanders MD Taking Active   [facility] test strip [id number] CHECK BLOOD SUGAR DAILY. BJinny Sanders MD Taking Active   pantoprazole (PROTONIX) 40 MG tablet [id number] TAKE 1 TABLET BY MOUTH TWICE  DAILY Bedsole, Amy E, MD Taking Active   pioglitazone (ACTOS) 45 MG tablet [id number] Take 1 tablet (45 mg total) by mouth daily. BJinny Sanders MD Taking Active   Pirfenidone 801 MG TABS [id number] TAKE 1 TABLET (801MG) BY MOUTH  THREE TIMES DAILY WITH FOOD Mannam, Praveen, MD Taking Active   predniSONE (DELTASONE) 20 MG tablet [id number] Take 1 tablet (20 mg total) by mouth daily with breakfast. MMarshell Garfinkel MD Taking Active   Probiotic Product (PROBIOTIC DAILY PO) [id number] Take by mouth. [provider] Taking Active Self  rosuvastatin (CRESTOR) 10 MG tablet [id number] Take 1 tablet (10 mg total) by mouth daily. BJinny Sanders MD Taking Active   triamcinolone cream (KENALOG) 0.1 % [id number] Apply 1 Application topically 2 (two) times daily. BJinny Sanders MD Taking Active   verapamil (CALAN-SR) 180 MG CR tablet [id number] TAKE 1 TABLET BY MOUTH DAILY Bedsole, Amy E, MD Taking Active   zaleplon (SONATA) 5 MG capsule [id number] Take 1 capsule (5 mg total) by mouth at bedtime as needed for sleep. BJinny Sanders MD Taking Active             Patient Active Problem List   Diagnosis Date Noted   Acute cystitis without hematuria 04/03/2022    Neck pain on left side 03/05/2021   Lumbar back pain with radiculopathy affecting left lower extremity 01/23/2021   Urinary frequency 03/16/2019   Vaginal itching 03/16/2019   Nausea vomiting and diarrhea 02/21/2019   Controlled diabetes mellitus type 2 with complications ([place] 118/56/3149  MDD (recurrent major depressive disorder) in remission ([place] 12/08/2017   Chronic insomnia 12/08/2017   UTI due to Klebsiella species 06/22/2017   Candidal intertrigo 07/08/2016   Right sciatic nerve pain 03/09/2015   Chronic pain of left thumb 03/09/2015   Allergic rhinitis 10/06/2014   Chronic hypoxemic respiratory failure ([place] 01/31/2014   Cystocele 11/11/2013   Hypertension associated with diabetes ([place] 08/25/2013   Hyperlipidemia associated with type 2 diabetes mellitus ([place] 08/25/2013   Common migraine 08/25/2013   Hypothyroidism 08/25/2013   OSA (obstructive sleep apnea) 08/22/2013   GERD (gastroesophageal reflux disease) 06/01/2013   Esophageal dysmotility 05/17/2013   UIP (usual interstitial pneumonitis) ([place] 10/19/2012    Immunization History  Administered Date(s) Administered   Fluad Quad(high Dose 65+) 04/03/2020, 04/15/2021   Influenza Split 05/02/2005, 11/28/2014   Influenza Whole 04/20/2012   Influenza, High Dose Seasonal PF 04/16/2018   Influenza,inj,Quad PF,6+ Mos 05/17/2013, 04/04/2014, 10/06/2014, 05/11/2015, 10/19/2015, 03/12/2016, 04/30/2017, 10/14/2017, 03/16/2019   Influenza-Unspecified 04/20/2013   Moderna Covid-19 Vaccine Bivalent Booster 15yr& up 04/29/2021   Moderna Sars-Covid-2 Vaccination 08/20/2019, 09/19/2019, 05/17/2020   Pneumococcal Conjugate-13 04/04/2014   Pneumococcal Polysaccharide-23 04/20/2013, 04/16/2018   Tdap 07/21/2009    Conditions to be addressed/monitored:  Hypertension, Hyperlipidemia, and Diabetes, UIP, insomnia  Care Plan : [place]  Updates made by Charlton Haws, RPH since 05/28/2022 12:00 AM     Problem:  Hypertension, Hyperlipidemia, and Diabetes, UIP, insomnia   Priority: High     Long-Range Goal: Disease Management   Start Date: 10/03/2020  Expected End Date: 11/08/2022  This Visit's Progress: On track  Recent Progress: On track  Priority: High  Note:   Current Barriers:  Suboptimal therapeutic regimen for DM  Pharmacist Clinical Goal(s):  Patient will adhere to plan to optimize therapeutic regimen for DM as evidenced by report of adherence to recommended medication management changes through collaboration with PharmD and provider.   Interventions: 1:1 collaboration with [name], MD regarding development and update of comprehensive plan of care as evidenced by provider attestation and co-signature Inter-disciplinary care team collaboration (see longitudinal plan of care) Comprehensive medication review performed; medication list updated in electronic medical record  Hyperlipidemia (LDL goal < 100) -Controlled - LDL 73 (02/2022), improved after switching to rosuvastatin -Current treatment: Rosuvastatin 10 mg daily - Appropriate, Effective, Safe, Accessible -Medications previously tried: Atorvastatin (muscle pain) -Educated on Cholesterol goals; Benefits of statin for ASCVD risk reduction; -Recommend to continue current medication  Hypertension (BP goal <140/90) -Controlled - via clinic readings; Denies hypotensive/hypertensive symptoms -ECHO 04/2022 - EF 62-94%; grade I diastolic dysfunction; pt completed 21-monthcourse of furosemide and swelling has resolved -Current treatment: HCTZ 12.5 mg daily - Appropriate, Effective, Safe, Accessible Verapamil ER 180 mg daily -Appropriate, Effective, Safe, Accessible Losartan 50 mg daily -Appropriate, Effective, Safe, Accessible Furosemide 20 mg daily - completed course; removed from list -Medications previously tried: none  -Monitor BP at home periodically -Recommended to continue current medication  Diabetes (A1c goal  <7%) -Query Controlled - A1c 6.9% (02/2022), FWilder Gladehas been on hold since 9/19 however due to mycotic infection; pt had increased swelling at first but this has resolved after 30-days of furosemide; pt reports swelling has not returned since stopping furosemide -Current home glucose readings - checking daily fasting glucose: 195, 135, 149, 149, 169, 199 -Current medications: Metformin 5043mER - 4 tab AM -Appropriate, Effective, Safe, Accessible Pioglitazone 45 mg daily -Appropriate, Effective, Query Safe -Medications previously tried: FaIranmycotic infxn)  -Current exercise: minimal due to lung condition -Reviewed potential issues with pioglitazone - recent ECHO showed diastolic dysfunction; swelling has resolved so it is reasonable to continue pioglitazone -Recommend to continue current medication; consider stopping Actos if swelling returns  UIP (usual interstitial pneumonitis) (Goal: manage symptoms) -Controlled -Hx OSA, wearing CPAP nightly; follows with pulmonology -Current treatment  Pirfenidone 801 mg TID -Appropriate, Effective, Safe, Accessible Mycophenolate 500 mg - 3 tab BID -Appropriate, Effective, Safe, Accessible Oxygen -Medications previously tried: n/a  -Recommended to continue current medication  Depression / Insomnia (Goal: manage symptoms) -Not ideally controlled - stopped taking nortriptyline due to hangover effect in morning, even though it was not really helping her fall asleep. Pt is not interested in trying a new medication at this point.  -PHQ9: 12 (08/2021) - moderate depression -Current treatment  Zaleplon 5 mg HS prn - Appropriate, Effective, Safe, Accessible -Medications previously tried: trazodone (SE), nortriptyline  -Recommend to continue current medication  Patient Goals/Self-Care Activities Patient will:  - take medications as prescribed as evidenced by patient report and record review focus on medication adherence by pill box check glucose  daily, document, and provide at future appointments check blood pressure periodically, document, and provide at future appointments  Medication Assistance: None required.  Patient affirms current coverage meets needs.  Compliance/Adherence/Medication fill history: Care Gaps: DEXA due 06/29/19 Eye exam due 04/17/21  Star-Rating Drugs: Farxiga - PDC 100% Losartan - PDC 100% Pioglitazone - PDC 99% Metformin - PDC 100% Pravastatin - PDC 100%  Medication Access: Within the past 30 days, how often has patient missed a dose of medication? 0 Is a pillbox or other method used to improve adherence? Yes  Factors that may affect medication adherence? no barriers identified Are meds synced by current pharmacy? No  Are meds delivered by current pharmacy? No  Does patient experience delays in picking up medications due to transportation concerns? No   [facility] Services Reviewed: Is patient disadvantaged to use [facility]?: No  Current Rx insurance plan: [facility] Name and location of Current pharmacy:  [place], [place] [address] Phone: [phone] Fax: [phone]  [facility] - [place], [place] [address] [place] [address] Phone: [phone] Fax: [phone]  [place], [place] [address] Phone: [phone] Fax: [phone]  [facility] services reviewed with patient today?: No  Patient requests to transfer care to [facility] Pharmacy?: No  Reason patient declined to change pharmacies: Loyalty to other pharmacy/Patient preference   Care Plan and Follow Up Patient Decision:  Patient agrees to Care Plan and Follow-up.  Plan: Telephone follow up appointment with care management team member scheduled for:  2 months  Charlene Brooke, PharmD, [facility] Clinical  Pharmacist [place] [facility] at [facility] [phone]

## 2022-05-28 NOTE — Patient Instructions (Signed)
Visit Information  Phone number for Pharmacist: [phone]   Goals Addressed   None     Care Plan : [place]  Updates made by Charlton Haws, RPH since 05/28/2022 12:00 AM     Problem: Hypertension, Hyperlipidemia, and Diabetes, UIP, insomnia   Priority: High     Long-Range Goal: Disease Management   Start Date: 10/03/2020  Expected End Date: 11/08/2022  This Visit's Progress: On track  Recent Progress: On track  Priority: High  Note:   Current Barriers:  Suboptimal therapeutic regimen for DM  Pharmacist Clinical Goal(s):  Patient will adhere to plan to optimize therapeutic regimen for DM as evidenced by report of adherence to recommended medication management changes through collaboration with PharmD and provider.   Interventions: 1:1 collaboration with [name], MD regarding development and update of comprehensive plan of care as evidenced by provider attestation and co-signature Inter-disciplinary care team collaboration (see longitudinal plan of care) Comprehensive medication review performed; medication list updated in electronic medical record  Hyperlipidemia (LDL goal < 100) -Controlled - LDL 73 (02/2022), improved after switching to rosuvastatin -Current treatment: Rosuvastatin 10 mg daily - Appropriate, Effective, Safe, Accessible -Medications previously tried: Atorvastatin (muscle pain) -Educated on Cholesterol goals; Benefits of statin for ASCVD risk reduction; -Recommend to continue current medication  Hypertension (BP goal <140/90) -Controlled - via clinic readings; Denies hypotensive/hypertensive symptoms -ECHO 04/2022 - EF 28-78%; grade I diastolic dysfunction; pt completed 72-monthcourse of furosemide and swelling has resolved -Current treatment: HCTZ 12.5 mg daily - Appropriate, Effective, Safe, Accessible Verapamil ER 180 mg daily -Appropriate, Effective, Safe, Accessible Losartan 50 mg daily -Appropriate, Effective, Safe,  Accessible Furosemide 20 mg daily - completed course; removed from list -Medications previously tried: none  -Monitor BP at home periodically -Recommended to continue current medication  Diabetes (A1c goal <7%) -Query Controlled - A1c 6.9% (02/2022), FWilder Gladehas been on hold since 9/19 however due to mycotic infection; pt had increased swelling at first but this has resolved after 30-days of furosemide; pt reports swelling has not returned since stopping furosemide -Current home glucose readings - checking daily fasting glucose: 195, 135, 149, 149, 169, 199 -Current medications: Metformin '500mg'$  ER - 4 tab AM -Appropriate, Effective, Safe, Accessible Pioglitazone 45 mg daily -Appropriate, Effective, Query Safe -Medications previously tried: FIran(mycotic infxn)  -Current exercise: minimal due to lung condition -Reviewed potential issues with pioglitazone - recent ECHO showed diastolic dysfunction; swelling has resolved so it is reasonable to continue pioglitazone -Recommend to continue current medication; consider stopping Actos if swelling returns  UIP (usual interstitial pneumonitis) (Goal: manage symptoms) -Controlled -Hx OSA, wearing CPAP nightly; follows with pulmonology -Current treatment  Pirfenidone 801 mg TID -Appropriate, Effective, Safe, Accessible Mycophenolate 500 mg - 3 tab BID -Appropriate, Effective, Safe, Accessible Oxygen -Medications previously tried: n/a  -Recommended to continue current medication  Depression / Insomnia (Goal: manage symptoms) -Not ideally controlled - stopped taking nortriptyline due to hangover effect in morning, even though it was not really helping her fall asleep. Pt is not interested in trying a new medication at this point.  -PHQ9: 12 (08/2021) - moderate depression -Current treatment  Zaleplon 5 mg HS prn - Appropriate, Effective, Safe, Accessible -Medications previously tried: trazodone (SE), nortriptyline  -Recommend to continue  current medication  Patient Goals/Self-Care Activities Patient will:  - take medications as prescribed as evidenced by patient report and record review focus on medication adherence by pill box check glucose daily, document, and provide at future appointments  check blood pressure periodically, document, and provide at future appointments       Patient verbalizes understanding of instructions and care plan provided today and agrees to view in [place]. Active MyChart status and patient understanding of how to access instructions and care plan via MyChart confirmed with patient.    Telephone follow up appointment with pharmacy team member scheduled for: 2 months  Charlene Brooke, PharmD, [facility] Clinical Pharmacist [place] [facility] [phone]

## 2022-05-29 ENCOUNTER — Encounter: Payer: Self-pay | Admitting: Pulmonary Disease

## 2022-05-29 ENCOUNTER — Ambulatory Visit: Payer: Medicare Other | Admitting: Pulmonary Disease

## 2022-05-29 ENCOUNTER — Ambulatory Visit (INDEPENDENT_AMBULATORY_CARE_PROVIDER_SITE_OTHER): Payer: Medicare Other

## 2022-05-29 ENCOUNTER — Telehealth: Payer: Self-pay | Admitting: Pulmonary Disease

## 2022-05-29 ENCOUNTER — Telehealth: Payer: Medicare Other | Admitting: Pulmonary Disease

## 2022-05-29 VITALS — BP 124/60 | HR 90 | Temp 97.8°F | Ht 65.5 in | Wt 215.0 lb

## 2022-05-29 DIAGNOSIS — Z5181 Encounter for therapeutic drug level monitoring: Secondary | ICD-10-CM | POA: Diagnosis not present

## 2022-05-29 DIAGNOSIS — J849 Interstitial pulmonary disease, unspecified: Secondary | ICD-10-CM

## 2022-05-29 DIAGNOSIS — R059 Cough, unspecified: Secondary | ICD-10-CM | POA: Diagnosis not present

## 2022-05-29 DIAGNOSIS — R0602 Shortness of breath: Secondary | ICD-10-CM

## 2022-05-29 DIAGNOSIS — R0600 Dyspnea, unspecified: Secondary | ICD-10-CM | POA: Diagnosis not present

## 2022-05-29 DIAGNOSIS — E876 Hypokalemia: Secondary | ICD-10-CM

## 2022-05-29 DIAGNOSIS — J84112 Idiopathic pulmonary fibrosis: Secondary | ICD-10-CM

## 2022-05-29 LAB — CBC WITH DIFFERENTIAL/PLATELET
Basophils Absolute: 0.1 10*3/uL (ref 0.0–0.1)
Basophils Relative: 0.6 % (ref 0.0–3.0)
Eosinophils Absolute: 0.1 10*3/uL (ref 0.0–0.7)
Eosinophils Relative: 0.9 % (ref 0.0–5.0)
HCT: 36.1 % (ref 36.0–46.0)
Hemoglobin: 11.3 g/dL — ABNORMAL LOW (ref 12.0–15.0)
Lymphocytes Relative: 7.1 % — ABNORMAL LOW (ref 12.0–46.0)
Lymphs Abs: 1.1 10*3/uL (ref 0.7–4.0)
MCHC: 31.3 g/dL (ref 30.0–36.0)
MCV: 95.4 fl (ref 78.0–100.0)
Monocytes Absolute: 0.4 10*3/uL (ref 0.1–1.0)
Monocytes Relative: 2.6 % — ABNORMAL LOW (ref 3.0–12.0)
Neutro Abs: 13.3 10*3/uL — ABNORMAL HIGH (ref 1.4–7.7)
Neutrophils Relative %: 88.8 % — ABNORMAL HIGH (ref 43.0–77.0)
Platelets: 279 10*3/uL (ref 150.0–400.0)
RBC: 3.78 Mil/uL — ABNORMAL LOW (ref 3.87–5.11)
RDW: 14.1 % (ref 11.5–15.5)
WBC: 15 10*3/uL — ABNORMAL HIGH (ref 4.0–10.5)

## 2022-05-29 LAB — COMPREHENSIVE METABOLIC PANEL
ALT: 8 U/L (ref 0–35)
AST: 12 U/L (ref 0–37)
Albumin: 4.3 g/dL (ref 3.5–5.2)
Alkaline Phosphatase: 55 U/L (ref 39–117)
BUN: 17 mg/dL (ref 6–23)
CO2: 41 mEq/L — ABNORMAL HIGH (ref 19–32)
Calcium: 9.9 mg/dL (ref 8.4–10.5)
Chloride: 86 mEq/L — ABNORMAL LOW (ref 96–112)
Creatinine, Ser: 0.66 mg/dL (ref 0.40–1.20)
GFR: 88.2 mL/min (ref >60.00)
Glucose, Bld: 197 mg/dL — ABNORMAL HIGH (ref 70–99)
Potassium: 2.7 mEq/L — CL (ref 3.5–5.1)
Sodium: 138 mEq/L (ref 135–145)
Total Bilirubin: 0.3 mg/dL (ref 0.2–1.2)
Total Protein: 7.2 g/dL (ref 6.0–8.3)

## 2022-05-29 MED ORDER — POTASSIUM CHLORIDE CRYS ER 20 MEQ PO TBCR: 40.0000 meq | EXTENDED_RELEASE_TABLET | Freq: Every day | ORAL | 0 refills | Status: DC

## 2022-05-29 MED ORDER — AZITHROMYCIN 250 MG PO TABS: ORAL_TABLET | ORAL | 0 refills | Status: DC

## 2022-05-29 MED ORDER — ALPRAZOLAM 0.25 MG PO TABS: 0.2500 mg | ORAL_TABLET | Freq: Two times a day (BID) | ORAL | 0 refills | Status: DC | PRN

## 2022-05-29 NOTE — Progress Notes (Signed)
Kaitlin Hamilton    [id number]    Aug 02, 1950  Primary Care Physician:Bedsole, Mervyn Gay, MD  Referring Physician: [name], MD [place],  [place] [address]  Chief complaint:  Follow-up for interstitial lung disease On CellCept, Esbriet [started 2018]   HPI: 71 y.o.  with history of biopsy-proven UIP pulmonary fibrosis.  Previously followed by Dr. [name] lung biopsy in December 2014 with findings showing UIP fibrosis.  There is strong suspicion for underlying connective tissue disease as she responds to prednisone. She has also been evaluated by Dr. [name] [name] at [facility]. Esbriet started in 2018 for progressive pulmonary fibrosis. She got a dose of evusheld for COVID prophylaxis in April 2022  She has had 2 episodes of diverticulitis in 2019 and 2020, E. coli UTI in 2020 and Campylobacter colitis in December 2020. We reduced her CellCept to 500 mg twice daily due to recurrent episodes of UTI however her breathing got worse and she is back now at 1 g twice daily with stabilization of symptoms  2023 We had ordered home sleep study in 2023 but patient did not want to go through that as it required taking the test on room air  Reports gradually worsening dyspnea on exertion in 2023 with increased fatigue.  In July 2023 we increased CellCept to 1.5 g twice daily and started prednisone.  She had some issues with prednisone due to side effects of insomnia  Interim history: Continues on Esbriet and CellCept  Overall she is declining with increasing fatigue, dyspnea.  More acutely for the past 1 week she is having increased sinus congestion and nasal discharge. Echocardiogram done but no significant pulmonary hypertension  Outpatient Encounter Medications as of 05/29/2022  Medication Sig   acetaminophen (TYLENOL) 500 MG tablet Take 1,000 mg by mouth every 6 (six) hours as needed for moderate pain.   Ascorbic Acid (VITAMIN C PO) Take by mouth.    dextromethorphan-guaiFENesin (MUCINEX DM) 30-600 MG 12hr tablet Take 1 tablet by mouth 2 (two) times daily.   FIBER PO Take 1 tablet by mouth daily.   fluticasone (FLONASE) 50 MCG/ACT nasal spray Place 1 spray into both nostrils daily.   glipiZIDE (GLUCOTROL XL) 10 MG 24 hr tablet Take 1 tablet (10 mg total) by mouth daily with breakfast.   hydrochlorothiazide (HYDRODIURIL) 12.5 MG tablet TAKE 1 TABLET DAILY ALONG WITH ONE OF THE LOSARTAN 50 MG TABLETS   levothyroxine (SYNTHROID) 125 MCG tablet TAKE 1 TABLET BY MOUTH DAILY  BEFORE BREAKFAST   losartan (COZAAR) 50 MG tablet TAKE 1 TABLET DAILY ALONG WITH 1 OF THE HCTZ 12.5MG TABLETS   metFORMIN (GLUCOPHAGE-XR) 500 MG 24 hr tablet Take 4 tablets (2,000 mg total) by mouth daily with breakfast.   MONISTAT 3 COMBINATION PACK 200 & 2 MG-% (9GM) KIT    montelukast (SINGULAIR) 10 MG tablet Take 1 tablet (10 mg total) by mouth at bedtime.   mycophenolate (CELLCEPT) 500 MG tablet Take 1565m twice a day   NON FORMULARY Place 4 L into the nose daily. 10 Liters with exertion   nystatin cream (MYCOSTATIN) Apply 1 application topically 3 (three) times daily.   OneTouch Delica Lancets 342PMISC CHECK BLOOD SUGAR ONCE DAILY.   ONETOUCH ULTRA test strip CHECK BLOOD SUGAR DAILY.   pantoprazole (PROTONIX) 40 MG tablet TAKE 1 TABLET BY MOUTH TWICE  DAILY   pioglitazone (ACTOS) 45 MG tablet Take 1 tablet (45 mg total) by mouth  daily.   Pirfenidone 801 MG TABS TAKE 1 TABLET (801MG) BY MOUTH  THREE TIMES DAILY WITH FOOD   predniSONE (DELTASONE) 20 MG tablet Take 1 tablet (20 mg total) by mouth daily with breakfast.   Probiotic Product (PROBIOTIC DAILY PO) Take by mouth.   rosuvastatin (CRESTOR) 10 MG tablet Take 1 tablet (10 mg total) by mouth daily.   triamcinolone cream (KENALOG) 0.1 % Apply 1 Application topically 2 (two) times daily.   verapamil (CALAN-SR) 180 MG CR tablet TAKE 1 TABLET BY MOUTH DAILY   baclofen (LIORESAL) 10 MG tablet Take 1 tablet (10 mg total)  by mouth 3 (three) times daily as needed. (Patient not taking: Reported on 05/29/2022)   zaleplon (SONATA) 5 MG capsule Take 1 capsule (5 mg total) by mouth at bedtime as needed for sleep. (Patient not taking: Reported on 05/29/2022)   [DISCONTINUED] azithromycin (ZITHROMAX) 250 MG tablet Take 1 tablet (250 mg total) by mouth daily.   [DISCONTINUED] fluconazole (DIFLUCAN) 150 MG tablet Take 1 tablet (150 mg total) by mouth daily.   No facility-administered encounter medications on file as of 05/29/2022.   Physical Exam: Blood pressure 124/60, pulse 90, temperature 97.8 F (36.6 C), temperature source Oral, height 5' 5.5" (1.664 m), weight 215 lb (97.5 kg), SpO2 97 %. Gen:      No acute distress, chronically ill-appearing, elderly HEENT:  EOMI, sclera anicteric Neck:     No masses; no thyromegaly Lungs:    Clear to auscultation bilaterally; normal respiratory effort CV:         Regular rate and rhythm; no murmurs Abd:      + bowel sounds; soft, non-tender; no palpable masses, no distension Ext:    No edema; adequate peripheral perfusion Skin:      Warm and dry; no rash Neuro: alert and oriented x 3 Psych: normal mood and affect   Data Reviewed: Imaging: 11/2012 CT chest >>  centrilobular nodules, interlobular septal thickening worse in bases and periphery R lung > L; some GGO in bases and periphery as well, some bronchiectasis in the bases R > L; findings suggestive of fibrosis but not UIP; question hypersensitivity pneumonitis given centrilobular nodules; also question aspiration   04/2013 Barium swallow> abnormal esophageal motility, GERD, hiatal hernia   04/2013 CT chest ([name])> findings suggestive of but not diagnostic of NSIP, small pulmonary nodule  February 2016 CT chest high resolution> slight progression in reticular abnormalities consistent with pulmonary fibrosis, uip  High-resolution CT chest 08/04/2019-basilar predominant fibrotic interstitial lung disease with mild  honeycombing.  Mild progressions 2016.  UIP pattern  High-resolution CT 03/09/2021-stable UIP pattern pulmonary fibrosis  CT high-resolution 02/07/2022-UIP pattern pulmonary fibrosis stable compared to 2022 however overall progressed since 2016. I reviewed the images personally   PFTs: 04/28/2018 FVC 1.1 [35%], FEV1 1.02 [42%], F/F 92, DLCO 8.88 [36%] Severe restriction, diffusion impairment. With worsening compared to prior years.  10/12/2019 FVC 1.13 [36%], FEV1 1.05 [44%], F/F 93, DLCO 8.76 [44%] Severe restriction, diffusion impairment.  Stable compared to 2019  09/28/2020 FVC 1.03 [33%], FEV1 0.97 [41%], DLCO 7.94 [40%] Severe restriction, diffusion impairment  02/14/2022 FVC 0.99 [33%], FEV1 0.95 [41%], F/F 96, TLC 2.12 [41%], DLCO 6.87 [35%] Severe restriction, diffusion impairment.  Worsening compared to 2022  6 minute walk 10/19/2012 walked 500 feet in office on room air oxygenation did not drop below 90% 04/2013 6MW RA > 1100 feet, HR peak 109, O2 sat Nadir 87% 11/2013 6MW 1364 feet, O2 saturation nadir 78% 08/21/14  6MW 1400 feet 90% on 8L  Labs: 03/2013 ANA, ANCA, Anti-Jo-1, ESR, RF, SCL-70, anti-centromere, SSA/SSB, all negative; CRP 0.6;   07/17/2019-CBC within normal limits 07/26/2020-hepatic panel within normal limits  Cardiac: Echocardiogram 05/08/2022-LVEF 60 to 65%, normal RV size and function.  Normal PA systolic pressure.  Estimated RVSP 36  Assessment:  UIP fibrosis Presumed autoimmune component given response to immunosuppressive's in the past Currently maintained on CellCept 1.5 g twice daily, pirfenidone and prednisone 20 mg Not on PJP prophylaxis due to allergies to sulfa drugs and atovaquone  Notes worsening dyspnea and fatigue.  Although CT is stable for the past 1 year clinically she is worse and PFTs show worsening restriction and diffusion impairment.  There is no pulmonary hypertension on echocardiogram. Unfortunately this may be just worsening of her  ILD which in fact has been progressive since 2016.   I will add Rituxan therapy as there are recent reports that Rituxan plus CellCept shows improvement in connective tissue disease related ILD (European Respiratory Journal 2023; DOI: 10.1183/13993003.02071-2022)  Sinus congestion Check baseline labs for monitoring Prescribe Z-Pak and chest x-ray today as she is having sinus congestion  OSA Continue CPAP  Goals of care We discussed goals of care and she is not ready to give up yet.  We discussed DNR status also but no decision has been made She is having anxiety due to increasing dyspnea and I will start her on low-dose Xanax as needed  Plan/Recommendations: Continue pirfenidone, CellCept, prednisone Check labs for monitoring Z-Pak, chest x-ray Start Rituxan Xanax for anxiety  [name] MD [facility] 05/29/2022, 10:30 AM  CC: [name], MD

## 2022-05-29 NOTE — Telephone Encounter (Signed)
Call report from the lab- K 2.7 05/29/22   Routing to Dr Vaughan Browner as urgent

## 2022-05-29 NOTE — Telephone Encounter (Signed)
Dr Vaughan Browner made aware of lab result and rec that we call in klor con 40 meq qd x 7 days and recheck CMET in a wk  I called and spoke with the pt and made here aware She verbalized understanding  I have sent her rx to preferred pharm  Lab order placed

## 2022-05-29 NOTE — Patient Instructions (Signed)
We will get a chest x-ray today Check comprehensive metabolic panel and CBC Prescribe Z-Pak I will also personally send in a prescription for Xanax for anxiety and Rituxan IV therapy for interstitial lung disease Continue the other medications as prescribed Follow-up in December as scheduled.

## 2022-06-01 ENCOUNTER — Encounter: Payer: Self-pay | Admitting: Pulmonary Disease

## 2022-06-02 ENCOUNTER — Telehealth: Payer: Self-pay | Admitting: Pharmacy Technician

## 2022-06-02 ENCOUNTER — Telehealth: Payer: Self-pay | Admitting: Pulmonary Disease

## 2022-06-02 ENCOUNTER — Other Ambulatory Visit: Payer: Self-pay | Admitting: Family Medicine

## 2022-06-02 NOTE — Telephone Encounter (Signed)
Dr. Vaughan Browner,  The [employer]'s preferred rituximab products are Ruxience and Truxima, and requires a trial of at least one of the products before coverage will be provided for the requested product Rituxan   Would you want to use one of the preferred products? Ruxience Truxima  Would you like to use Ruxience?

## 2022-06-02 NOTE — Telephone Encounter (Signed)
Okay to send the furosemide refill to pharmacy

## 2022-06-02 NOTE — Telephone Encounter (Signed)
Dr. Vaughan Browner, Juluis Rainier note:  Auth Submission: APPROVED Payer: [employer] [facility] [employer] [facility] MEDICARE Medication & CPT/J Code(s) submitted: Ruxience (Rituximab-pvvr) [phone] Route of submission (phone, fax, portal): PORTAL Phone # Fax # Auth type: Buy/Bill Units/visits requested: '1000MG'$  X2 DOSE Reference number: [id number] Approval from: 06/02/22  to  06/03/23  at [facility]  Patient will be scheduled as soon as possible.

## 2022-06-02 NOTE — Telephone Encounter (Signed)
Last office visit 04/08/22 for vaginal itching.  Last refilled 04/30/22 for 30 g with no refills.  Next Appt: 06/06/22 for DM.

## 2022-06-02 NOTE — Telephone Encounter (Signed)
Just clarifying that the messages for rituximab as the subject of the message says infliximab.  I am okay to use any of the rituximab products that are approved by the insurance.

## 2022-06-02 NOTE — Telephone Encounter (Signed)
Fax received from pt's pharmacy in regards to a refill of pt's furosemide '20mg'$  tablet.  Quantity requested is 30tabs with instructions for pt to take one by mouth daily.  Medication is no longer showing up on pt's med list. Dr. Vaughan Browner, please advise if you are okay with pt having a refill of med sent to pharmacy.

## 2022-06-02 NOTE — Telephone Encounter (Signed)
Spoke with [name] (per DPR) that pt would like to have blood work completed at PCP office ( Dr. [name]) and [name] also stated pt is still have nasal congestion and is on the last day of ABT. [name] states pt is not having fevers but still "not feeling well and feeling run down". Dr. Vaughan Browner please advise.

## 2022-06-03 ENCOUNTER — Telehealth: Payer: Self-pay | Admitting: Pulmonary Disease

## 2022-06-03 DIAGNOSIS — E876 Hypokalemia: Secondary | ICD-10-CM

## 2022-06-03 DIAGNOSIS — R0981 Nasal congestion: Secondary | ICD-10-CM

## 2022-06-03 MED ORDER — AZITHROMYCIN 250 MG PO TABS: ORAL_TABLET | ORAL | 0 refills | Status: DC

## 2022-06-03 MED ORDER — FUROSEMIDE 20 MG PO TABS: 20.0000 mg | ORAL_TABLET | Freq: Every day | ORAL | 2 refills | Status: DC

## 2022-06-03 NOTE — Telephone Encounter (Signed)
Spoke to the pt's daughter and informed her Dr Vaughan Browner gave the ok to have labs drawn at [facility] office and will send in azithromycin (ZITHROMAX) 250 MG instead of Augmentin 875 because it upsets pt's stomach.

## 2022-06-03 NOTE — Telephone Encounter (Signed)
Patient's daughter is calling stating that she still needs to speak to the nurse because she still has some questions and has not gotten closure on her previous messages.  Please call daughter to discuss further at [phone]

## 2022-06-03 NOTE — Telephone Encounter (Signed)
Spoke with pt and she states she stop taken the furosemide due to her blood work showing her potassium being low. Pt also states she will start back taken the furosemide once her potassium is stable. And ask to still have the furosemide sent to the pharmacy. Which I have sent furosemide 20 MG to pt's pharmacy. Nothing further needed at this time.

## 2022-06-03 NOTE — Telephone Encounter (Signed)
Daughter would like for Kaitlin Hamilton to have her potassium rechecked at her PCP and would like for you to put the order in. And also she finished her antibiotics yesterday and has been having some congestion still. And the daughter is wondering if she needs another round of antibiotics. Dr Vaughan Browner please advise.

## 2022-06-03 NOTE — Telephone Encounter (Signed)
[place] for the PCP to check potassium levels.  Please send in a prescription for augmentin 875 mg bid for 5 days

## 2022-06-03 NOTE — Telephone Encounter (Signed)
Called and spoke with patient. She verbalized understanding. She is aware that I will place the order for the potassium to be checked at Dr. Rometta Emery office.   I mentioned the augmentin and she stated that she has taken this before in the past and it caused her to have an upset stomach. She wanted to know if Dr. Vaughan Browner would be willing to send in another zpak for her.   Dr. Vaughan Browner, can you please advise? Thanks!

## 2022-06-04 ENCOUNTER — Other Ambulatory Visit: Payer: Self-pay | Admitting: Pharmacy Technician

## 2022-06-04 ENCOUNTER — Encounter: Payer: Self-pay | Admitting: Pulmonary Disease

## 2022-06-04 DIAGNOSIS — J8489 Other specified interstitial pulmonary diseases: Secondary | ICD-10-CM | POA: Diagnosis not present

## 2022-06-04 DIAGNOSIS — G4733 Obstructive sleep apnea (adult) (pediatric): Secondary | ICD-10-CM | POA: Diagnosis not present

## 2022-06-05 ENCOUNTER — Other Ambulatory Visit: Payer: Self-pay | Admitting: Family Medicine

## 2022-06-06 ENCOUNTER — Encounter: Payer: Self-pay | Admitting: Family Medicine

## 2022-06-06 ENCOUNTER — Telehealth: Payer: Self-pay | Admitting: Family Medicine

## 2022-06-06 ENCOUNTER — Other Ambulatory Visit: Payer: Self-pay | Admitting: Family Medicine

## 2022-06-06 ENCOUNTER — Ambulatory Visit (INDEPENDENT_AMBULATORY_CARE_PROVIDER_SITE_OTHER): Payer: Medicare Other | Admitting: Family Medicine

## 2022-06-06 VITALS — BP 120/60 | HR 89 | Temp 97.6°F | Ht 65.5 in | Wt 220.4 lb

## 2022-06-06 DIAGNOSIS — N898 Other specified noninflammatory disorders of vagina: Secondary | ICD-10-CM

## 2022-06-06 DIAGNOSIS — I5032 Chronic diastolic (congestive) heart failure: Secondary | ICD-10-CM

## 2022-06-06 DIAGNOSIS — E118 Type 2 diabetes mellitus with unspecified complications: Secondary | ICD-10-CM | POA: Diagnosis not present

## 2022-06-06 DIAGNOSIS — E876 Hypokalemia: Secondary | ICD-10-CM

## 2022-06-06 LAB — POCT GLYCOSYLATED HEMOGLOBIN (HGB A1C): Hemoglobin A1C: 7.2 % — AB (ref 4.0–5.6)

## 2022-06-06 LAB — COMPREHENSIVE METABOLIC PANEL
ALT: 8 U/L (ref 0–35)
AST: 10 U/L (ref 0–37)
Albumin: 4.3 g/dL (ref 3.5–5.2)
Alkaline Phosphatase: 55 U/L (ref 39–117)
BUN: 13 mg/dL (ref 6–23)
CO2: 32 mEq/L (ref 19–32)
Calcium: 10 mg/dL (ref 8.4–10.5)
Chloride: 96 mEq/L (ref 96–112)
Creatinine, Ser: 0.61 mg/dL (ref 0.40–1.20)
GFR: 89.88 mL/min (ref >60.00)
Glucose, Bld: 146 mg/dL — ABNORMAL HIGH (ref 70–99)
Potassium: 4.5 mEq/L (ref 3.5–5.1)
Sodium: 139 mEq/L (ref 135–145)
Total Bilirubin: 0.3 mg/dL (ref 0.2–1.2)
Total Protein: 7.3 g/dL (ref 6.0–8.3)

## 2022-06-06 MED ORDER — FLUCONAZOLE 150 MG PO TABS: 150.0000 mg | ORAL_TABLET | Freq: Once | ORAL | 0 refills | Status: AC

## 2022-06-06 MED ORDER — OZEMPIC (0.25 OR 0.5 MG/DOSE) 2 MG/3ML ~~LOC~~ SOPN: 0.2500 mg | PEN_INJECTOR | SUBCUTANEOUS | 11 refills | Status: DC

## 2022-06-06 MED ORDER — OZEMPIC (0.25 OR 0.5 MG/DOSE) 2 MG/3ML ~~LOC~~ SOPN: PEN_INJECTOR | SUBCUTANEOUS | 11 refills | Status: DC

## 2022-06-06 NOTE — Progress Notes (Signed)
Patient ID: Kaitlin Hamilton, female    DOB: 1951/02/14, 71 y.o.   MRN: [id number]  This visit was conducted in person.  BP 120/60   Pulse 89   Temp 97.6 F (36.4 C) (Oral)   Ht 5' 5.5" (1.664 m)   Wt 220 lb 6 oz (100 kg)   SpO2 99% Comment: 8 L O2  BMI 36.11 kg/m    CC:  Chief Complaint  Patient presents with   Diabetes   Vaginal Burning    Subjective:   HPI: Kaitlin Hamilton is a 71 y.o. female presenting on 06/06/2022 for Diabetes and Vaginal Burning  Diabetes:   Lab Results  Component Value Date   HGBA1C 7.2 (A) 06/06/2022    On Glucotrol XL 10 mg daily ( almost 1 month), metformin XR 500 mg 4 tablets daily, Actos 45 mg daily Using medications without difficulties: none Hypoglycemic episodes:none Hyperglycemic episodes: none Feet problems: Blood Sugars averaging: FBS  eye exam within last year:   SE to [place] to costly.  May 08, 2022 echocardiogram showed ejection fraction 60 to 19%, grade 1 diastolic dysfunction.      Reviewed  last OV note from 05/29/2022 Pulmonologist  Given Zpack  x 2 , restarted prednisone daily 20 mg daily on 10.23/2023  Vaginal irritation.. improved with stopping [place] and treating with diflucan. Wet prep negative.  Recommended triamcinolone BID.Marland Kitchen has helped.  Has noted in last week some burning again on right... now on antibiotics.    Potassium got really low 2.7 on 05/29/22  after lasix 30 days.  Treated with high dose potassium.Marland Kitchen due recheck today.   Relevant past medical, surgical, family and social history reviewed and updated as indicated. Interim medical history since our last visit reviewed. Allergies and medications reviewed and updated. Outpatient Medications Prior to Visit  Medication Sig Dispense Refill   acetaminophen (TYLENOL) 500 MG tablet Take 1,000 mg by mouth every 6 (six) hours as needed for moderate pain.     ALPRAZolam (XANAX) 0.25 MG tablet Take 1 tablet (0.25 mg total) by mouth 2 (two)  times daily as needed for anxiety. 30 tablet 0   Ascorbic Acid (VITAMIN C PO) Take by mouth.     azithromycin (ZITHROMAX) 250 MG tablet Take two today and then one daily until finished. 6 tablet 0   baclofen (LIORESAL) 10 MG tablet Take 1 tablet (10 mg total) by mouth 3 (three) times daily as needed. 30 tablet 0   dextromethorphan-guaiFENesin (MUCINEX DM) 30-600 MG 12hr tablet Take 1 tablet by mouth 2 (two) times daily.     FIBER PO Take 1 tablet by mouth daily.     fluticasone (FLONASE) 50 MCG/ACT nasal spray Place 1 spray into both nostrils daily.     furosemide (LASIX) 20 MG tablet Take 1 tablet (20 mg total) by mouth daily. Take one by mouth daily. 30 tablet 2   glipiZIDE (GLUCOTROL XL) 10 MG 24 hr tablet Take 1 tablet (10 mg total) by mouth daily with breakfast. 30 tablet 11   hydrochlorothiazide (HYDRODIURIL) 12.5 MG tablet TAKE 1 TABLET DAILY ALONG WITH ONE OF THE LOSARTAN 50 MG TABLETS 90 tablet 1   levothyroxine (SYNTHROID) 125 MCG tablet TAKE 1 TABLET BY MOUTH DAILY  BEFORE BREAKFAST 100 tablet 0   losartan (COZAAR) 50 MG tablet TAKE 1 TABLET DAILY ALONG WITH 1 OF THE HCTZ 12.5MG TABLETS 90 tablet 1   metFORMIN (GLUCOPHAGE-XR) 500 MG 24 hr tablet Take 4 tablets (2,000 mg  total) by mouth daily with breakfast. 360 tablet 1   MONISTAT 3 COMBINATION PACK 200 & 2 MG-% (9GM) KIT      montelukast (SINGULAIR) 10 MG tablet Take 1 tablet (10 mg total) by mouth at bedtime. 90 tablet 1   mycophenolate (CELLCEPT) 500 MG tablet Take 15109m twice a day 180 tablet 5   NON FORMULARY Place 6-8 L into the nose daily. 10 Liters with exertion     nystatin cream (MYCOSTATIN) Apply 1 application topically 3 (three) times daily. 30 g 0   OneTouch Delica Lancets 384XMISC CHECK BLOOD SUGAR ONCE DAILY. 100 each 3   ONETOUCH ULTRA test strip CHECK BLOOD SUGAR DAILY. 50 strip 5   pantoprazole (PROTONIX) 40 MG tablet TAKE 1 TABLET BY MOUTH TWICE  DAILY 200 tablet 0   pioglitazone (ACTOS) 45 MG tablet Take 1 tablet  (45 mg total) by mouth daily. 90 tablet 1   Pirfenidone 801 MG TABS TAKE 1 TABLET (801MG) BY MOUTH  THREE TIMES DAILY WITH FOOD 270 tablet 1   potassium chloride SA (KLOR-CON M) 20 MEQ tablet Take 2 tablets (40 mEq total) by mouth daily. 14 tablet 0   predniSONE (DELTASONE) 20 MG tablet Take 1 tablet (20 mg total) by mouth daily with breakfast. 30 tablet 1   Probiotic Product (PROBIOTIC DAILY PO) Take by mouth.     rosuvastatin (CRESTOR) 10 MG tablet Take 1 tablet (10 mg total) by mouth daily. 90 tablet 3   triamcinolone cream (KENALOG) 0.1 % Apply 1 Application topically 2 (two) times daily. 30 g 0   verapamil (CALAN-SR) 180 MG CR tablet TAKE 1 TABLET BY MOUTH DAILY 100 tablet 0   zaleplon (SONATA) 5 MG capsule Take 1 capsule (5 mg total) by mouth at bedtime as needed for sleep. (Patient not taking: Reported on 05/29/2022) 30 capsule 0   No facility-administered medications prior to visit.     Per HPI unless specifically indicated in ROS section below Review of Systems  Constitutional:  Negative for fatigue and fever.  HENT:  Negative for congestion.   Eyes:  Negative for pain.  Respiratory:  Positive for shortness of breath. Negative for cough.   Cardiovascular:  Negative for chest pain, palpitations and leg swelling.  Gastrointestinal:  Negative for abdominal pain.  Genitourinary:  Positive for vaginal pain. Negative for dysuria and vaginal bleeding.  Musculoskeletal:  Negative for back pain.  Neurological:  Negative for syncope, light-headedness and headaches.  Psychiatric/Behavioral:  Negative for dysphoric mood.    Objective:  BP 120/60   Pulse 89   Temp 97.6 F (36.4 C) (Oral)   Ht 5' 5.5" (1.664 m)   Wt 220 lb 6 oz (100 kg)   SpO2 99% Comment: 8 L O2  BMI 36.11 kg/m   Wt Readings from Last 3 Encounters:  06/06/22 220 lb 6 oz (100 kg)  05/29/22 215 lb (97.5 kg)  04/08/22 220 lb 8 oz (100 kg)      Physical Exam Constitutional:      General: She is not in acute  distress.    Appearance: Normal appearance. She is well-developed. She is not ill-appearing or toxic-appearing.  HENT:     Head: Normocephalic.     Right Ear: Hearing, tympanic membrane, ear canal and external ear normal. Tympanic membrane is not erythematous, retracted or bulging.     Left Ear: Hearing, tympanic membrane, ear canal and external ear normal. Tympanic membrane is not erythematous, retracted or bulging.  Nose: No mucosal edema or rhinorrhea.     Right Sinus: No maxillary sinus tenderness or frontal sinus tenderness.     Left Sinus: No maxillary sinus tenderness or frontal sinus tenderness.     Mouth/Throat:     Pharynx: Uvula midline.  Eyes:     General: Lids are normal. Lids are everted, no foreign bodies appreciated.     Conjunctiva/sclera: Conjunctivae normal.     Pupils: Pupils are equal, round, and reactive to light.  Neck:     Thyroid: No thyroid mass or thyromegaly.     Vascular: No carotid bruit.     Trachea: Trachea normal.  Cardiovascular:     Rate and Rhythm: Normal rate and regular rhythm.     Pulses: Normal pulses.     Heart sounds: Normal heart sounds, S1 normal and S2 normal. No murmur heard.    No friction rub. No gallop.  Pulmonary:     Effort: Pulmonary effort is normal. No tachypnea or respiratory distress.     Breath sounds: Decreased breath sounds and rales present. No wheezing or rhonchi.  Abdominal:     General: Bowel sounds are normal.     Palpations: Abdomen is soft.     Tenderness: There is no abdominal tenderness.  Musculoskeletal:     Cervical back: Normal range of motion and neck supple.  Skin:    General: Skin is warm and dry.     Findings: No rash.  Neurological:     Mental Status: She is alert.  Psychiatric:        Mood and Affect: Mood is not anxious or depressed.        Speech: Speech normal.        Behavior: Behavior normal. Behavior is cooperative.        Thought Content: Thought content normal.        Judgment: Judgment  normal.       Results for orders placed or performed in visit on 05/29/22  CBC with Differential/Platelet  Result Value Ref Range   WBC 15.0 (H) 4.0 - 10.5 K/uL   RBC 3.78 (L) 3.87 - 5.11 Mil/uL   Hemoglobin 11.3 (L) 12.0 - 15.0 g/dL   HCT 36.1 36.0 - 46.0 %   MCV 95.4 78.0 - 100.0 fl   MCHC 31.3 30.0 - 36.0 g/dL   RDW 14.1 11.5 - 15.5 %   Platelets 279.0 150.0 - 400.0 K/uL   Neutrophils Relative % 88.8 (H) 43.0 - 77.0 %   Lymphocytes Relative 7.1 (L) 12.0 - 46.0 %   Monocytes Relative 2.6 (L) 3.0 - 12.0 %   Eosinophils Relative 0.9 0.0 - 5.0 %   Basophils Relative 0.6 0.0 - 3.0 %   Neutro Abs 13.3 (H) 1.4 - 7.7 K/uL   Lymphs Abs 1.1 0.7 - 4.0 K/uL   Monocytes Absolute 0.4 0.1 - 1.0 K/uL   Eosinophils Absolute 0.1 0.0 - 0.7 K/uL   Basophils Absolute 0.1 0.0 - 0.1 K/uL  Comp Met (CMET)  Result Value Ref Range   Sodium 138 135 - 145 mEq/L   Potassium 2.7 repeated (LL) 3.5 - 5.1 mEq/L   Chloride 86 (L) 96 - 112 mEq/L   CO2 41 (H) 19 - 32 mEq/L   Glucose, Bld 197 (H) 70 - 99 mg/dL   BUN 17 6 - 23 mg/dL   Creatinine, Ser 0.66 0.40 - 1.20 mg/dL   Total Bilirubin 0.3 0.2 - 1.2 mg/dL   Alkaline Phosphatase 55 39 -  117 U/L   AST 12 0 - 37 U/L   ALT 8 0 - 35 U/L   Total Protein 7.2 6.0 - 8.3 g/dL   Albumin 4.3 3.5 - 5.2 g/dL   GFR 88.20 >60.00 mL/min   Calcium 9.9 8.4 - 10.5 mg/dL     COVID 19 screen:  No recent travel or known exposure to COVID19 The patient denies respiratory symptoms of COVID 19 at this time. The importance of social distancing was discussed today.   Assessment and Plan Problem List Items Addressed This Visit     Controlled diabetes mellitus type 2 with complications ([place]) - Primary (Chronic)    Continue glipizide and metformin.  Stop Actos.  Start ozempic low dose weekly.  Call with blood sugar measurements aft 1-2 weeks.      Relevant Orders   POCT glycosylated hemoglobin (Hb A1C) (Completed)   Chronic diastolic heart failure ([place])    She had  recent spell out of peripheral edema an echocardiogram was repeated.  Normal left sided heart function but evidence of grade 1 right-sided heart failure. She was treated by Dr. Vaughan Browner with Lasix for approximately 30 days.  This helped significantly with fluid but she had resulting hypokalemia.  She is no longer taking Lasix and is repeating her potassium.  She has no further peripheral swelling  I am concerned the Actos could be causing this recent spell of peripheral edema and would increase her risk for left-sided heart failure and worsening diastolic function. We will D/C Actos.      Low blood potassium    Due for re-eval.      V[place]var itching    Acute. Had initially improved with stopping [place] and treating with Diflucan Wet prep was negative so I recommended triamcinolone twice daily for remaining vaginal irritation.  Now in the last week she has had some burning off and on again but she is now back on antibiotics.  Will treat for presumed recurrent vaginal candidiasis      Meds ordered this encounter  Medications   fluconazole (DIFLUCAN) 150 MG tablet    Sig: Take 1 tablet (150 mg total) by mouth once for 1 dose. Then repeat 1 dose after antibitoics completed    Dispense:  2 tablet    Refill:  0   DISCONTD: Semaglutide,0.25 or 0.5MG/DOS, (OZEMPIC, 0.25 OR 0.5 MG/DOSE,) 2 MG/3ML SOPN    Sig: 1 injection weekly for diabetes.    Dispense:  3 mL    Refill:  11   Orders Placed This Encounter  Procedures   POCT glycosylated hemoglobin (Hb A1C)       [name], MD

## 2022-06-06 NOTE — Telephone Encounter (Signed)
Mickel Baas from [facility] called and asked if patient is doing 0.5 or 0.25 Medication  Ozempic because they received no directions. Call back number [phone].

## 2022-06-06 NOTE — Telephone Encounter (Signed)
Sent in corrected refill

## 2022-06-06 NOTE — Patient Instructions (Addendum)
Please stop at the lab to have labs drawn.  Remain off lasix  Stop Actos.  Continue glipizide and metformin.  Stop Actos.  Start ozempic low dose weekly.  Call with blood sugar measurements aft 1-2 weeks.  Take diflucan x 1  now then repeat after antibiotic completed.  Follow up if vaginal issue not resolving.

## 2022-06-06 NOTE — Assessment & Plan Note (Signed)
She had recent spell out of peripheral edema an echocardiogram was repeated.  Normal left sided heart function but evidence of grade 1 right-sided heart failure. She was treated by Dr. Vaughan Browner with Lasix for approximately 30 days.  This helped significantly with fluid but she had resulting hypokalemia.  She is no longer taking Lasix and is repeating her potassium.  She has no further peripheral swelling  I am concerned the Actos could be causing this recent spell of peripheral edema and would increase her risk for left-sided heart failure and worsening diastolic function. We will D/C Actos.

## 2022-06-09 ENCOUNTER — Other Ambulatory Visit: Payer: Self-pay | Admitting: Family Medicine

## 2022-06-16 ENCOUNTER — Encounter: Payer: Self-pay | Admitting: Family Medicine

## 2022-06-16 ENCOUNTER — Ambulatory Visit (INDEPENDENT_AMBULATORY_CARE_PROVIDER_SITE_OTHER): Payer: Medicare Other | Admitting: Family Medicine

## 2022-06-16 ENCOUNTER — Telehealth: Payer: Self-pay

## 2022-06-16 VITALS — BP 144/62 | HR 103 | Temp 97.7°F | Ht 65.5 in | Wt 215.0 lb

## 2022-06-16 DIAGNOSIS — B372 Candidiasis of skin and nail: Secondary | ICD-10-CM

## 2022-06-16 MED ORDER — NYSTATIN 100000 UNIT/GM EX CREA: TOPICAL_CREAM | Freq: Three times a day (TID) | CUTANEOUS | 0 refills | Status: DC

## 2022-06-16 MED ORDER — FLUCONAZOLE 150 MG PO TABS: ORAL_TABLET | ORAL | 0 refills | Status: DC

## 2022-06-16 NOTE — Patient Instructions (Signed)
I think your rash is from yeast  Keep clean with gentle soap and water  Dry very well -use the hair dryer on cool to get very dry   Take the diflucan pill today and then another dose in 3 days   Continue the nystatin cream and if not improving later this week give Kaitlin Hamilton a call  Hold off on cortisone Do not use the triamcinolone on these areas   If rash worsens or if you develop a fever or other new symptoms please let Kaitlin Hamilton know

## 2022-06-16 NOTE — Assessment & Plan Note (Signed)
Recurrent in pt with DM and also on frequent abx  Today in L pannus/ R thigh fold and consistent with yeast  Enc her to continue nystatin cream Keep clean and dry as tolerated Stop any steroid creams Px nystatin 150 mg now and in 3 d  Update if not starting to improve in a week or if worsening    Consider ketoconazole if not improving

## 2022-06-16 NOTE — Telephone Encounter (Signed)
Pt has already been arrived for appt with Dr Glori Bickers 06/16/22 at 12:30. Sending note to Dr Glori Bickers and [employer].  [place] Night - Client TELEPHONE ADVICE RECORD [facility] Patient Name: Kaitlin Hamilton Gender: Female DOB: 12-21-1950 Age: 71 Y 30 M 15 D Return Phone Number: [phone] (Primary) Address: City/ State/ Zip: [place] [address] Client [place] Night - Client Client Site [place] Provider [name] - MD Contact Type Call Who Is Calling Patient / Member / Family / Caregiver Call Type Triage / Clinical Relationship To Patient Self Return Phone Number [phone] (Primary) Chief Complaint Headache Reason for Call Symptomatic / Request for [place] states she is experiencing headaches with an abdominal rash after getting an ozempic shot. Translation No Nurse Assessment Nurse: Altamease Oiler, RN, Adriana Date/Time (Eastern Time): 06/13/2022 8:17:18 AM Confirm and document reason for call. If symptomatic, describe symptoms. ---pt states that she was started on ozempic on tuesday. has been having headaches for 2 days, has a rash to left side of abdomen. redness, about the size of softball. Does the patient have any new or worsening symptoms? ---Yes Will a triage be completed? ---Yes Related visit to physician within the last 2 weeks? ---Yes Does the PT have any chronic conditions? (i.e. diabetes, asthma, this includes High risk factors for pregnancy, etc.) ---Yes List chronic conditions. ---diabetes high cholesterol hypothyroid lung disease htn Is this a behavioral health or substance abuse call? ---No Guidelines Guideline Title Affirmed Question Affirmed Notes Nurse Date/Time (Eastern Time) Headache [1] MODERATE headache (e.g., interferes with normal activities) AND [2] present > 24 hours AND [3] unexplained (Exceptions: analgesics not  tried, typical migraine, or Altamease Oiler, RN, Fabio Bering 06/13/2022 8:23:11 AM PLEASE NOTE: All timestamps contained within this report are represented as [place] Standard Time. CONFIDENTIALTY NOTICE: This fax transmission is intended only for the addressee. It contains information that is legally privileged, confidential or otherwise protected from use or disclosure. If you are not the intended recipient, you are strictly prohibited from reviewing, disclosing, copying using or disseminating any of this information or taking any action in reliance on or regarding this information. If you have received this fax in error, please notify us immediately by telephone so that we can arrange for its return to [place]. Phone: [phone], Toll-Free: [phone], Fax: [phone] Page: 2 of 2 Call Id: [id number] Guidelines Guideline Title Affirmed Question Affirmed Notes Nurse Date/Time Eilene Ghazi Time) headache part of viral illness) Rash or Redness - Localized [1] Looks infected (spreading redness, pus) AND [2] large red area (> 2 in. or 5 cm) Altamease Oiler, RN, Adriana 06/13/2022 8:26:06 AM Disp. Time Eilene Ghazi Time) Disposition Final User 06/13/2022 8:25:41 AM See PCP within 24 Hours Sarajane Marek 06/13/2022 8:29:40 AM See HCP within 4 Hours (or PCP triage) Yes Altamease Oiler, RN, Adriana Final Disposition 06/13/2022 8:29:40 AM See HCP within 4 Hours (or PCP triage) Yes Altamease Oiler, RN, Fabio Bering Caller Disagree/Comply Disagree Caller Understands Yes PreDisposition Call Doctor Care Advice Given Per Guideline SEE PCP WITHIN 24 HOURS: * IF OFFICE WILL BE OPEN: You need to be examined within the next 24 hours. Call your doctor (or NP/PA) when the office opens and make an appointment. CALL BACK IF: * You become worse CARE ADVICE given per Headache (Adult) guideline. SEE HCP (OR PCP TRIAGE) WITHIN 4 HOURS: * IF OFFICE WILL BE OPEN: You need to be seen within the next 3 or 4 hours. Call your doctor (  or NP/PA) now or as  soon as the office opens. CALL BACK IF: * You become worse CARE ADVICE given per Rash - Localized and Cause Unknown (Adult) guideline. Comments User: Kizzie Fantasia, RN Date/Time Eilene Ghazi Time): 06/13/2022 8:23:45 AM head pain 6/10, took tylenol last night. no help Referrals GO TO FACILITY UNDECIDE

## 2022-06-16 NOTE — Progress Notes (Signed)
Subjective:    Patient ID: Kaitlin Hamilton, female    DOB: 1950-10-27, 71 y.o.   MRN: [id number]  HPI 71 yo pt of Dr [name] presents for a rash   Wt Readings from Last 3 Encounters:  06/16/22 215 lb (97.5 kg)  06/06/22 220 lb 6 oz (100 kg)  05/29/22 215 lb (97.5 kg)   35.23 kg/m  Started ozemic on Tuesday (first injection)   Thursday am got up with rash L lower abdomen  Crease of thigh involved  Itches a bit around the area   Using nystatin cream and some cortisone and seasorb powder  Not getting any better   Took diflucan with last abx middle of this month (took one and then repeated dose)    On abx on and off for several months  For lung problem and sinus problem   Patient Active Problem List   Diagnosis Date Noted   Chronic diastolic heart failure ([facility]) 06/06/2022   Acute cystitis without hematuria 04/03/2022   Neck pain on left side 03/05/2021   Lumbar back pain with radiculopathy affecting left lower extremity 01/23/2021   Urinary frequency 03/16/2019   Vaginal itching 03/16/2019   Nausea vomiting and diarrhea 02/21/2019   Controlled diabetes mellitus type 2 with complications ([place]) 54/49/2010   MDD (recurrent major depressive disorder) in remission ([place]) 12/08/2017   Chronic insomnia 12/08/2017   UTI due to Klebsiella species 06/22/2017   Candidal intertrigo 07/08/2016   Right sciatic nerve pain 03/09/2015   Chronic pain of left thumb 03/09/2015   Allergic rhinitis 10/06/2014   Chronic hypoxemic respiratory failure ([place]) 01/31/2014   Cystocele 11/11/2013   Hypertension associated with diabetes ([place]) 08/25/2013   Hyperlipidemia associated with type 2 diabetes mellitus ([place]) 08/25/2013   Common migraine 08/25/2013   Hypothyroidism 08/25/2013   OSA (obstructive sleep apnea) 08/22/2013   ILD (interstitial lung disease) ([place]) 06/29/2013   GERD (gastroesophageal reflux disease) 06/01/2013   Esophageal dysmotility 05/17/2013   UIP (usual interstitial  pneumonitis) ([place]) 10/19/2012   Past Medical History:  Diagnosis Date   Allergic rhinitis    uses Flonase daily   Anesthesia complication    Per pt/ past endoscopy in 2015, the anesthetic spray in back throat caused her to have breathing problems   Anxiety    Bronchitis    Chicken pox    Constipation    takes Fiber daily   Depression    Diverticulosis    Dysphagia    GERD (gastroesophageal reflux disease)    takes Protonix daily   H/O hiatal hernia    Hemorrhoids    History of bronchitis    2013   History of colon polyps    History of kidney stones    Hyperlipidemia    lost 43 pounds and no meds required at present   Hypertension    takes Verapamil and HCTZ daily   Hypokalemia    Hypothyroidism (acquired)    takes Synthroid daily   Insomnia    doesn't take any meds for this   Joint swelling    left thumb   Migraines    last one about a month ago   Non-insulin dependent type 2 diabetes mellitus (HCC)    takes Glipizide daily   OA (osteoarthritis)    left knee   Oxygen deficiency    Oxygen dependent    Pt is on continuous O2 at 3 liters!   Pneumonia    last time in 2006   PONV (postoperative nausea  and vomiting)    Shortness of breath    with exertion;takes Singulair daily as well as Flonase   Urinary frequency    Past Surgical History:  Procedure Laterality Date   ABDOMINAL EXPLORATION SURGERY     For Ovarian Cyst    APPENDECTOMY     CHOLECYSTECTOMY     COLONOSCOPY  05/2013   TA, severe diverticulosis, rpt 3 yrs ([place])   ENTEROSCOPY N/A 08/12/2013   Procedure: ENTEROSCOPY;  Surgeon: Lafayette Dragon, MD;  Location: [facility];  Service: Endoscopy;  Laterality: N/A;   ESOPHAGOGASTRODUODENOSCOPY     left knee arthroscopy     LITHOTRIPSY     x 2   LUNG BIOPSY Right 06/29/2013   Procedure: LUNG BIOPSY;  Surgeon: Melrose Nakayama, MD;  Location: [facility];  Service: Thoracic;  Laterality: Right;   TCS     TUBAL LIGATION     VIDEO ASSISTED THORACOSCOPY  Right 06/29/2013   Procedure: VIDEO ASSISTED THORACOSCOPY;  Surgeon: Melrose Nakayama, MD;  Location: [facility];  Service: Thoracic;  Laterality: Right;   Social History   Tobacco Use   Smoking status: Never    Passive exposure: Past   Smokeless tobacco: Never  Vaping Use   Vaping Use: Never used  Substance Use Topics   Alcohol use: No    Alcohol/week: 0.0 standard drinks of alcohol   Drug use: No   Family History  Problem Relation Age of Onset   Rheum arthritis Mother    Rheum arthritis Sister    Prostate cancer Brother    Heart disease Brother    Heart disease Maternal Grandmother    Heart disease Maternal Grandfather    Uterine cancer Daughter    Colon cancer Neg Hx    Breast cancer Neg Hx    Allergies  Allergen Reactions   Adhesive [Tape]     Redness, bruising with any adhesives- bandaids, patches, etc.    Atovaquone Itching   Codeine Hives and Swelling   Demerol [Meperidine] Hives and Swelling   Hydrocodone Nausea Only   Augmentin [Amoxicillin-Pot Clavulanate] Other (See Comments)    Upsets stomach   Trazodone And Nefazodone Itching   Sulfa Antibiotics Rash   Current Outpatient Medications on File Prior to Visit  Medication Sig Dispense Refill   acetaminophen (TYLENOL) 500 MG tablet Take 1,000 mg by mouth every 6 (six) hours as needed for moderate pain.     ALPRAZolam (XANAX) 0.25 MG tablet Take 1 tablet (0.25 mg total) by mouth 2 (two) times daily as needed for anxiety. 30 tablet 0   Ascorbic Acid (VITAMIN C PO) Take by mouth.     baclofen (LIORESAL) 10 MG tablet Take 1 tablet (10 mg total) by mouth 3 (three) times daily as needed. 30 tablet 0   FIBER PO Take 1 tablet by mouth daily.     fluticasone (FLONASE) 50 MCG/ACT nasal spray Place 1 spray into both nostrils daily.     furosemide (LASIX) 20 MG tablet Take 1 tablet (20 mg total) by mouth daily. Take one by mouth daily. 30 tablet 2   glipiZIDE (GLUCOTROL XL) 10 MG 24 hr tablet Take 1 tablet (10 mg total) by  mouth daily with breakfast. 30 tablet 11   glucose blood (ONETOUCH ULTRA) test strip CHECK BLOOD SUGAR DAILY. 50 strip 5   hydrochlorothiazide (HYDRODIURIL) 12.5 MG tablet TAKE 1 TABLET DAILY ALONG WITH ONE OF THE LOSARTAN 50 MG TABLETS 90 tablet 1   levothyroxine (SYNTHROID) 125 MCG tablet  TAKE 1 TABLET BY MOUTH DAILY  BEFORE BREAKFAST 100 tablet 0   losartan (COZAAR) 50 MG tablet TAKE 1 TABLET DAILY ALONG WITH 1 OF THE HCTZ 12.5MG TABLETS 90 tablet 1   metFORMIN (GLUCOPHAGE-XR) 500 MG 24 hr tablet Take 4 tablets (2,000 mg total) by mouth daily with breakfast. 360 tablet 1   MONISTAT 3 COMBINATION PACK 200 & 2 MG-% (9GM) KIT      montelukast (SINGULAIR) 10 MG tablet Take 1 tablet (10 mg total) by mouth at bedtime. 90 tablet 1   mycophenolate (CELLCEPT) 500 MG tablet Take 1530m twice a day 180 tablet 5   NON FORMULARY Place 6-8 L into the nose daily. 10 Liters with exertion     OneTouch Delica Lancets 302RMISC CHECK BLOOD SUGAR ONCE DAILY. 100 each 3   pantoprazole (PROTONIX) 40 MG tablet TAKE 1 TABLET BY MOUTH TWICE  DAILY 200 tablet 0   pioglitazone (ACTOS) 45 MG tablet Take 1 tablet (45 mg total) by mouth daily. 90 tablet 1   Pirfenidone 801 MG TABS TAKE 1 TABLET (801MG) BY MOUTH  THREE TIMES DAILY WITH FOOD 270 tablet 1   potassium chloride SA (KLOR-CON M) 20 MEQ tablet Take 2 tablets (40 mEq total) by mouth daily. 14 tablet 0   predniSONE (DELTASONE) 20 MG tablet Take 1 tablet (20 mg total) by mouth daily with breakfast. 30 tablet 1   Probiotic Product (PROBIOTIC DAILY PO) Take by mouth.     rosuvastatin (CRESTOR) 10 MG tablet Take 1 tablet (10 mg total) by mouth daily. 90 tablet 3   Semaglutide,0.25 or 0.5MG/DOS, (OZEMPIC, 0.25 OR 0.5 MG/DOSE,) 2 MG/3ML SOPN Take 0.25 mg by mouth once a week. 1 injection weekly for diabetes. 3 mL 11   triamcinolone cream (KENALOG) 0.1 % Apply 1 Application topically 2 (two) times daily. 30 g 0   verapamil (CALAN-SR) 180 MG CR tablet TAKE 1 TABLET BY MOUTH  DAILY 100 tablet 0   No current facility-administered medications on file prior to visit.     Review of Systems  Constitutional:  Negative for activity change, appetite change, fatigue, fever and unexpected weight change.  HENT:  Negative for congestion, ear pain, rhinorrhea, sinus pressure and sore throat.   Eyes:  Negative for pain, redness and visual disturbance.  Respiratory:  Positive for shortness of breath. Negative for wheezing.        Baseline  On 02   Cardiovascular:  Negative for chest pain and palpitations.  Gastrointestinal:  Negative for abdominal pain, blood in stool, constipation and diarrhea.  Endocrine: Negative for polydipsia and polyuria.  Genitourinary:  Negative for dysuria, frequency and urgency.  Musculoskeletal:  Negative for arthralgias, back pain and myalgias.  Skin:  Positive for rash. Negative for pallor.       Some breakdown in R thigh crease  Allergic/Immunologic: Negative for environmental allergies.  Neurological:  Negative for dizziness, syncope and headaches.  Hematological:  Negative for adenopathy. Does not bruise/bleed easily.  Psychiatric/Behavioral:  Negative for decreased concentration and dysphoric mood. The patient is not nervous/anxious.        Objective:   Physical Exam Constitutional:      General: She is not in acute distress.    Appearance: Normal appearance. She is obese. She is not ill-appearing.  Eyes:     General:        Right eye: No discharge.        Left eye: No discharge.     Pupils: Pupils are  equal, round, and reactive to light.  Cardiovascular:     Rate and Rhythm: Regular rhythm. Tachycardia present.  Pulmonary:     Comments: Wearing 02 Not sob with speech Musculoskeletal:     Cervical back: Neck supple.  Lymphadenopathy:     Cervical: No cervical adenopathy.  Skin:    General: Skin is warm and dry.     Findings: Erythema and rash present. No bruising.     Comments: 3-5 cm oval area of erythema with scale  under pannus on L  Smaller area with scant skin breakdown in R thigh fold  Several satellite lesions  Consistent with yeast intertrigo  Neurological:     Mental Status: She is alert.  Psychiatric:        Mood and Affect: Mood normal.           Assessment & Plan:   Problem List Items Addressed This Visit       Musculoskeletal and Integument   Candidal intertrigo - Primary    Recurrent in pt with DM and also on frequent abx  Today in L pannus/ R thigh fold and consistent with yeast  Enc her to continue nystatin cream Keep clean and dry as tolerated Stop any steroid creams Px nystatin 150 mg now and in 3 d  Update if not starting to improve in a week or if worsening    Consider ketoconazole if not improving      Relevant Medications   nystatin cream (MYCOSTATIN)   fluconazole (DIFLUCAN) 150 MG tablet

## 2022-06-20 MED ORDER — KETOCONAZOLE 2 % EX CREA: 1.0000 | TOPICAL_CREAM | Freq: Every day | CUTANEOUS | 1 refills | Status: DC

## 2022-06-20 NOTE — Telephone Encounter (Signed)
Pt said she doesn't have dysuria it just burns because the urine is getting on open areas, pt will pick up new Rx and she has already schedule a f/u with PCP on 06/25/22 to f/u on rash   FYI to PCP and Dr. Glori Bickers

## 2022-06-20 NOTE — Telephone Encounter (Signed)
Noted  

## 2022-06-20 NOTE — Telephone Encounter (Signed)
Pt called to give [facility] a update on her rashes. Pt states the rash under her stomach is doing better but the rash on the leg is still "cracked open". Pt states she still experiencing pain when urinating.pt stated [facility] said if she wasn't feeling better, [facility] would prescribe some stronger antibiotics. Call back #  [phone]

## 2022-06-20 NOTE — Addendum Note (Signed)
Addended by: Loura Pardon A on: 06/20/2022 09:46 AM   Modules accepted: Orders

## 2022-06-20 NOTE — Telephone Encounter (Signed)
Let's try ketoconazole instead of nystatin cream I sent it to her pharmacy   Is the urinary pain from urine hitting the rash/open area or is it different?  Thanks for the heads up  Will cc her pcp

## 2022-06-23 ENCOUNTER — Other Ambulatory Visit: Payer: Self-pay | Admitting: Family Medicine

## 2022-06-23 ENCOUNTER — Ambulatory Visit: Payer: Medicare Other | Admitting: Pulmonary Disease

## 2022-06-23 ENCOUNTER — Encounter: Payer: Self-pay | Admitting: Pulmonary Disease

## 2022-06-23 VITALS — BP 126/64 | HR 94 | Temp 97.6°F | Ht 65.0 in | Wt 213.4 lb

## 2022-06-23 DIAGNOSIS — J849 Interstitial pulmonary disease, unspecified: Secondary | ICD-10-CM | POA: Diagnosis not present

## 2022-06-23 DIAGNOSIS — Z23 Encounter for immunization: Secondary | ICD-10-CM | POA: Diagnosis not present

## 2022-06-23 DIAGNOSIS — Z5181 Encounter for therapeutic drug level monitoring: Secondary | ICD-10-CM

## 2022-06-23 MED ORDER — ALPRAZOLAM 0.25 MG PO TABS: 0.2500 mg | ORAL_TABLET | Freq: Two times a day (BID) | ORAL | 0 refills | Status: DC | PRN

## 2022-06-23 NOTE — Patient Instructions (Signed)
Continue your medications as prescribed Will renew the Xanax Reduce prednisone to 5 mg a day It is okay to hold the Lasix for now but monitor leg swelling and weight gain  Follow-up in 2 months.

## 2022-06-23 NOTE — Progress Notes (Unsigned)
Kaitlin Hamilton    [id number]    09-Aug-1950  Primary Care Physician:Bedsole, Mervyn Gay, MD  Referring Physician: [name], MD [place],  [place] [address]  Chief complaint:  Follow-up for interstitial lung disease On CellCept, Esbriet [started 2018]  Was unable to HPI: 71 y.o.  with history of biopsy-proven UIP pulmonary fibrosis.  Previously followed by Dr. [name] lung biopsy in December 2014 with findings showing UIP fibrosis.  There is strong suspicion for underlying connective tissue disease as she responds to prednisone. She has also been evaluated by Dr. [name] [name] at [facility]. Esbriet started in 2018 for progressive pulmonary fibrosis. She got a dose of evusheld for COVID prophylaxis in April 2022  She has had 2 episodes of diverticulitis in 2019 and 2020, E. coli UTI in 2020 and Campylobacter colitis in December 2020. We reduced her CellCept to 500 mg twice daily due to recurrent episodes of UTI however her breathing got worse and she is back now at 1 g twice daily with stabilization of symptoms  2023 We had ordered home sleep study in 2023 but patient did not want to go through that as it required taking the test on room air  Reports gradually worsening dyspnea on exertion in 2023 with increased fatigue.  In July 2023 we increased CellCept to 1.5 g twice daily and started prednisone.  She had some issues with prednisone due to side effects of insomnia  Interim history: Continues on Esbriet and CellCept  Overall she is declining with increasing fatigue, dyspnea. Echocardiogram done but no significant pulmonary hypertension    Outpatient Encounter Medications as of 06/23/2022  Medication Sig   acetaminophen (TYLENOL) 500 MG tablet Take 1,000 mg by mouth every 6 (six) hours as needed for moderate pain.   ALPRAZolam (XANAX) 0.25 MG tablet Take 1 tablet (0.25 mg total) by mouth 2 (two) times daily as needed for anxiety.   Ascorbic Acid  (VITAMIN C PO) Take by mouth.   baclofen (LIORESAL) 10 MG tablet Take 1 tablet (10 mg total) by mouth 3 (three) times daily as needed.   FIBER PO Take 1 tablet by mouth daily.   fluticasone (FLONASE) 50 MCG/ACT nasal spray Place 1 spray into both nostrils daily.   furosemide (LASIX) 20 MG tablet Take 1 tablet (20 mg total) by mouth daily. Take one by mouth daily.   glipiZIDE (GLUCOTROL XL) 10 MG 24 hr tablet Take 1 tablet (10 mg total) by mouth daily with breakfast.   glucose blood (ONETOUCH ULTRA) test strip CHECK BLOOD SUGAR DAILY.   hydrochlorothiazide (HYDRODIURIL) 12.5 MG tablet TAKE 1 TABLET DAILY ALONG WITH ONE OF THE LOSARTAN 50 MG TABLETS   ketoconazole (NIZORAL) 2 % cream Apply 1 Application topically daily.   levothyroxine (SYNTHROID) 125 MCG tablet TAKE 1 TABLET BY MOUTH DAILY  BEFORE BREAKFAST   losartan (COZAAR) 50 MG tablet TAKE 1 TABLET DAILY ALONG WITH 1 OF THE HCTZ 12.5MG TABLETS   metFORMIN (GLUCOPHAGE-XR) 500 MG 24 hr tablet Take 4 tablets (2,000 mg total) by mouth daily with breakfast.   MONISTAT 3 COMBINATION PACK 200 & 2 MG-% (9GM) KIT    montelukast (SINGULAIR) 10 MG tablet Take 1 tablet (10 mg total) by mouth at bedtime.   mycophenolate (CELLCEPT) 500 MG tablet Take 1537m twice a day   NON FORMULARY Place 6-8 L into the nose daily. 10 Liters with exertion   nystatin cream (MYCOSTATIN) Apply topically  3 (three) times daily.   OneTouch Delica Lancets 03K MISC CHECK BLOOD SUGAR ONCE DAILY.   pantoprazole (PROTONIX) 40 MG tablet TAKE 1 TABLET BY MOUTH TWICE  DAILY   Pirfenidone 801 MG TABS TAKE 1 TABLET (801MG) BY MOUTH  THREE TIMES DAILY WITH FOOD   potassium chloride SA (KLOR-CON M) 20 MEQ tablet Take 2 tablets (40 mEq total) by mouth daily.   predniSONE (DELTASONE) 20 MG tablet Take 1 tablet (20 mg total) by mouth daily with breakfast.   Probiotic Product (PROBIOTIC DAILY PO) Take by mouth.   rosuvastatin (CRESTOR) 10 MG tablet Take 1 tablet (10 mg total) by mouth  daily.   Semaglutide,0.25 or 0.5MG/DOS, (OZEMPIC, 0.25 OR 0.5 MG/DOSE,) 2 MG/3ML SOPN Take 0.25 mg by mouth once a week. 1 injection weekly for diabetes.   triamcinolone cream (KENALOG) 0.1 % Apply 1 Application topically 2 (two) times daily.   verapamil (CALAN-SR) 180 MG CR tablet TAKE 1 TABLET BY MOUTH DAILY   [DISCONTINUED] fluconazole (DIFLUCAN) 150 MG tablet Take one pill by mouth today and one in three days   [DISCONTINUED] pioglitazone (ACTOS) 45 MG tablet Take 1 tablet (45 mg total) by mouth daily.   No facility-administered encounter medications on file as of 06/23/2022.   Physical Exam: Blood pressure 124/60, pulse 90, temperature 97.8 F (36.6 C), temperature source Oral, height 5' 5.5" (1.664 m), weight 215 lb (97.5 kg), SpO2 97 %. Gen:      No acute distress, chronically ill-appearing, elderly HEENT:  EOMI, sclera anicteric Neck:     No masses; no thyromegaly Lungs:    Clear to auscultation bilaterally; normal respiratory effort CV:         Regular rate and rhythm; no murmurs Abd:      + bowel sounds; soft, non-tender; no palpable masses, no distension Ext:    No edema; adequate peripheral perfusion Skin:      Warm and dry; no rash Neuro: alert and oriented x 3 Psych: normal mood and affect   Data Reviewed: Imaging: 11/2012 CT chest >>  centrilobular nodules, interlobular septal thickening worse in bases and periphery R lung > L; some GGO in bases and periphery as well, some bronchiectasis in the bases R > L; findings suggestive of fibrosis but not UIP; question hypersensitivity pneumonitis given centrilobular nodules; also question aspiration   04/2013 Barium swallow> abnormal esophageal motility, GERD, hiatal hernia   04/2013 CT chest ([name])> findings suggestive of but not diagnostic of NSIP, small pulmonary nodule  February 2016 CT chest high resolution> slight progression in reticular abnormalities consistent with pulmonary fibrosis, uip  High-resolution CT chest  08/04/2019-basilar predominant fibrotic interstitial lung disease with mild honeycombing.  Mild progressions 2016.  UIP pattern  High-resolution CT 03/09/2021-stable UIP pattern pulmonary fibrosis  CT high-resolution 02/07/2022-UIP pattern pulmonary fibrosis stable compared to 2022 however overall progressed since 2016. I reviewed the images personally   PFTs: 04/28/2018 FVC 1.1 [35%], FEV1 1.02 [42%], F/F 92, DLCO 8.88 [36%] Severe restriction, diffusion impairment. With worsening compared to prior years.  10/12/2019 FVC 1.13 [36%], FEV1 1.05 [44%], F/F 93, DLCO 8.76 [44%] Severe restriction, diffusion impairment.  Stable compared to 2019  09/28/2020 FVC 1.03 [33%], FEV1 0.97 [41%], DLCO 7.94 [40%] Severe restriction, diffusion impairment  02/14/2022 FVC 0.99 [33%], FEV1 0.95 [41%], F/F 96, TLC 2.12 [41%], DLCO 6.87 [35%] Severe restriction, diffusion impairment.  Worsening compared to 2022  6 minute walk 10/19/2012 walked 500 feet in office on room air oxygenation did not drop below  90% 04/2013 6MW RA > 1100 feet, HR peak 109, O2 sat Nadir 87% 11/2013 6MW 1364 feet, O2 saturation nadir 78% 08/21/14 6MW 1400 feet 90% on 8L  Labs: 03/2013 ANA, ANCA, Anti-Jo-1, ESR, RF, SCL-70, anti-centromere, SSA/SSB, all negative; CRP 0.6;   07/17/2019-CBC within normal limits 07/26/2020-hepatic panel within normal limits  Cardiac: Echocardiogram 05/08/2022-LVEF 60 to 65%, normal RV size and function.  Normal PA systolic pressure.  Estimated RVSP 36  Assessment:  UIP fibrosis Presumed autoimmune component given response to immunosuppressive's in the past Currently maintained on CellCept 1.5 g twice daily, pirfenidone and prednisone 20 mg Not on PJP prophylaxis due to allergies to sulfa drugs and atovaquone  Notes worsening dyspnea and fatigue.  Although CT is stable for the past 1 year clinically she is worse and PFTs show worsening restriction and diffusion impairment.  There is no pulmonary  hypertension on echocardiogram. Unfortunately this may be just worsening of her ILD which in fact has been progressive since 2016.   I will add Rituxan therapy as there are recent reports that Rituxan plus CellCept shows improvement in connective tissue disease related ILD (European Respiratory Journal 2023; DOI: 10.1183/13993003.02071-2022)  Sinus congestion Check baseline labs for monitoring Prescribe Z-Pak and chest x-ray today as she is having sinus congestion  OSA Continue CPAP  Goals of care We discussed goals of care and she is not ready to give up yet.  We discussed DNR status also but no decision has been made She is having anxiety due to increasing dyspnea and I will start her on low-dose Xanax as needed  Plan/Recommendations: Continue pirfenidone, CellCept, prednisone Check labs for monitoring Z-Pak, chest x-ray Start Rituxan Xanax for anxiety  [name] MD [facility] 06/23/2022, 1:14 PM  CC: [name], MD

## 2022-06-25 ENCOUNTER — Encounter: Payer: Self-pay | Admitting: Family Medicine

## 2022-06-25 ENCOUNTER — Other Ambulatory Visit: Payer: Self-pay | Admitting: Family Medicine

## 2022-06-25 ENCOUNTER — Ambulatory Visit (INDEPENDENT_AMBULATORY_CARE_PROVIDER_SITE_OTHER): Payer: Medicare Other | Admitting: Family Medicine

## 2022-06-25 VITALS — BP 122/78 | HR 82 | Temp 97.7°F | Ht 65.5 in | Wt 216.0 lb

## 2022-06-25 DIAGNOSIS — L292 Pruritus vulvae: Secondary | ICD-10-CM | POA: Diagnosis not present

## 2022-06-25 DIAGNOSIS — E118 Type 2 diabetes mellitus with unspecified complications: Secondary | ICD-10-CM | POA: Diagnosis not present

## 2022-06-25 DIAGNOSIS — B372 Candidiasis of skin and nail: Secondary | ICD-10-CM | POA: Diagnosis not present

## 2022-06-25 MED ORDER — FLUCONAZOLE 150 MG PO TABS: 150.0000 mg | ORAL_TABLET | Freq: Once | ORAL | 0 refills | Status: AC

## 2022-06-25 MED ORDER — NYSTATIN 100000 UNIT/GM EX POWD: 1.0000 | Freq: Three times a day (TID) | CUTANEOUS | 0 refills | Status: DC

## 2022-06-25 NOTE — Progress Notes (Signed)
Patient ID: Kaitlin Hamilton, female    DOB: 1951-02-15, 71 y.o.   MRN: [id number]  This visit was conducted in person.  BP 122/78 (BP Location: Left Arm, Patient Position: Sitting)   Pulse 82   Temp 97.7 F (36.5 C) (Oral)   Ht 5' 5.5" (1.664 m)   Wt 216 lb (98 kg)   SpO2 99%   BMI 35.40 kg/m    CC:  Chief Complaint  Patient presents with   Rash    Skin tear,     Subjective:   HPI: Kaitlin Hamilton is a 71 y.o. female presenting on 06/25/2022 for Rash (Skin tear, )  She Had a rash on left groin  fold after she started the first injection of semaglutide 9 not likely associated but scared patient). No reaction around injection site. She  does not like having anyone having to come give her the medication.  She would like to stop the semaglutide.  Actos... swelling better off this.  Still on metformin and glipizide  FBS 84-170  Saw Dr. Glori Hamilton on 11/27 for rash ( in left  groin fold).. Dx with candidal intertrigo.   Treated with Nystatin, minimal response... changed to ketoconazole cream   Also  ongoing Vaginal irritation ( now present off and on since October).. improved with stopping [place] and treating with diflucan, but Wet prep negative.  Recommended triamcinolone BID.. had helped... but now worsening again.  Using barrier cream.  Relevant past medical, surgical, family and social history reviewed and updated as indicated. Interim medical history since our last visit reviewed. Allergies and medications reviewed and updated. Outpatient Medications Prior to Visit  Medication Sig Dispense Refill   acetaminophen (TYLENOL) 500 MG tablet Take 1,000 mg by mouth every 6 (six) hours as needed for moderate pain.     ALPRAZolam (XANAX) 0.25 MG tablet Take 1 tablet (0.25 mg total) by mouth 2 (two) times daily as needed for anxiety. 30 tablet 0   Ascorbic Acid (VITAMIN C PO) Take by mouth.     baclofen (LIORESAL) 10 MG tablet Take 1 tablet (10 mg total) by mouth 3 (three) times daily as  needed. 30 tablet 0   FIBER PO Take 1 tablet by mouth daily.     fluticasone (FLONASE) 50 MCG/ACT nasal spray Place 1 spray into both nostrils daily.     furosemide (LASIX) 20 MG tablet Take 1 tablet (20 mg total) by mouth daily. Take one by mouth daily. 30 tablet 2   glipiZIDE (GLUCOTROL XL) 10 MG 24 hr tablet Take 1 tablet (10 mg total) by mouth daily with breakfast. 30 tablet 11   glucose blood (ONETOUCH ULTRA) test strip CHECK BLOOD SUGAR DAILY. 50 strip 5   hydrochlorothiazide (HYDRODIURIL) 12.5 MG tablet TAKE 1 TABLET DAILY ALONG WITH ONE OF THE LOSARTAN 50 MG TABLETS 90 tablet 1   ketoconazole (NIZORAL) 2 % cream Apply 1 Application topically daily. 15 g 1   levothyroxine (SYNTHROID) 125 MCG tablet TAKE 1 TABLET BY MOUTH DAILY  BEFORE BREAKFAST 100 tablet 0   losartan (COZAAR) 50 MG tablet TAKE 1 TABLET DAILY ALONG WITH 1 OF THE HCTZ 12.5MG TABLETS 90 tablet 1   metFORMIN (GLUCOPHAGE-XR) 500 MG 24 hr tablet Take 4 tablets (2,000 mg total) by mouth daily with breakfast. 360 tablet 1   MONISTAT 3 COMBINATION PACK 200 & 2 MG-% (9GM) KIT      montelukast (SINGULAIR) 10 MG tablet Take 1 tablet (10 mg total) by mouth at  bedtime. 90 tablet 1   mycophenolate (CELLCEPT) 500 MG tablet Take 1511m twice a day 180 tablet 5   NON FORMULARY Place 6-8 L into the nose daily. 10 Liters with exertion     nystatin cream (MYCOSTATIN) Apply topically 3 (three) times daily. 30 g 0   OneTouch Delica Lancets 343XMISC CHECK BLOOD SUGAR ONCE DAILY. 100 each 3   pantoprazole (PROTONIX) 40 MG tablet TAKE 1 TABLET BY MOUTH TWICE  DAILY 200 tablet 0   Pirfenidone 801 MG TABS TAKE 1 TABLET (801MG) BY MOUTH  THREE TIMES DAILY WITH FOOD 270 tablet 1   potassium chloride SA (KLOR-CON M) 20 MEQ tablet Take 2 tablets (40 mEq total) by mouth daily. 14 tablet 0   Probiotic Product (PROBIOTIC DAILY PO) Take by mouth.     rosuvastatin (CRESTOR) 10 MG tablet Take 1 tablet (10 mg total) by mouth daily. 90 tablet 3    Semaglutide,0.25 or 0.5MG/DOS, (OZEMPIC, 0.25 OR 0.5 MG/DOSE,) 2 MG/3ML SOPN Take 0.25 mg by mouth once a week. 1 injection weekly for diabetes. 3 mL 11   triamcinolone cream (KENALOG) 0.1 % Apply 1 Application topically 2 (two) times daily. 30 g 0   verapamil (CALAN-SR) 180 MG CR tablet TAKE 1 TABLET BY MOUTH DAILY 100 tablet 0   predniSONE (DELTASONE) 20 MG tablet Take 1 tablet (20 mg total) by mouth daily with breakfast. (Patient not taking: Reported on 06/25/2022) 30 tablet 1   No facility-administered medications prior to visit.     Per HPI unless specifically indicated in ROS section below Review of Systems  Constitutional:  Negative for fatigue and fever.  HENT:  Negative for congestion.   Eyes:  Negative for pain.  Respiratory:  Negative for cough and shortness of breath.   Cardiovascular:  Negative for chest pain, palpitations and leg swelling.  Gastrointestinal:  Negative for abdominal pain.  Genitourinary:  Negative for dysuria and vaginal bleeding.  Musculoskeletal:  Negative for back pain.  Skin:  Positive for rash.  Neurological:  Negative for syncope, light-headedness and headaches.  Psychiatric/Behavioral:  Negative for dysphoric mood.    Objective:  BP 122/78 (BP Location: Left Arm, Patient Position: Sitting)   Pulse 82   Temp 97.7 F (36.5 C) (Oral)   Ht 5' 5.5" (1.664 m)   Wt 216 lb (98 kg)   SpO2 99%   BMI 35.40 kg/m   Wt Readings from Last 3 Encounters:  06/25/22 216 lb (98 kg)  06/23/22 213 lb 6.4 oz (96.8 kg)  06/16/22 215 lb (97.5 kg)      Physical Exam Constitutional:      General: She is not in acute distress.    Appearance: Normal appearance. She is well-developed. She is not ill-appearing or toxic-appearing.  HENT:     Head: Normocephalic.     Right Ear: Hearing, tympanic membrane, ear canal and external ear normal. Tympanic membrane is not erythematous, retracted or bulging.     Left Ear: Hearing, tympanic membrane, ear canal and external ear  normal. Tympanic membrane is not erythematous, retracted or bulging.     Nose: No mucosal edema or rhinorrhea.     Right Sinus: No maxillary sinus tenderness or frontal sinus tenderness.     Left Sinus: No maxillary sinus tenderness or frontal sinus tenderness.     Mouth/Throat:     Pharynx: Uvula midline.  Eyes:     General: Lids are normal. Lids are everted, no foreign bodies appreciated.     Conjunctiva/sclera:  Conjunctivae normal.     Pupils: Pupils are equal, round, and reactive to light.  Neck:     Thyroid: No thyroid mass or thyromegaly.     Vascular: No carotid bruit.     Trachea: Trachea normal.  Cardiovascular:     Rate and Rhythm: Normal rate and regular rhythm.     Pulses: Normal pulses.     Heart sounds: Normal heart sounds, S1 normal and S2 normal. No murmur heard.    No friction rub. No gallop.  Pulmonary:     Effort: Pulmonary effort is normal. No tachypnea or respiratory distress.     Breath sounds: Normal breath sounds. No decreased breath sounds, wheezing, rhonchi or rales.  Abdominal:     General: Bowel sounds are normal.     Palpations: Abdomen is soft.     Tenderness: There is no abdominal tenderness.  Musculoskeletal:     Cervical back: Normal range of motion and neck supple.  Skin:    General: Skin is warm and dry.     Findings: No rash.     Comments: Under B pannus in groin.Marland Kitchen erythema, odor and discharge.  Neurological:     Mental Status: She is alert.  Psychiatric:        Mood and Affect: Mood is not anxious or depressed.        Speech: Speech normal.        Behavior: Behavior normal. Behavior is cooperative.        Thought Content: Thought content normal.        Judgment: Judgment normal.    Lab Results  Component Value Date   HGBA1C 7.2 (A) 06/06/2022       Results for orders placed or performed in visit on 06/06/22  Comp Met (CMET)  Result Value Ref Range   Sodium 139 135 - 145 mEq/L   Potassium 4.5 3.5 - 5.1 mEq/L   Chloride 96 96 -  112 mEq/L   CO2 32 19 - 32 mEq/L   Glucose, Bld 146 (H) 70 - 99 mg/dL   BUN 13 6 - 23 mg/dL   Creatinine, Ser 0.61 0.40 - 1.20 mg/dL   Total Bilirubin 0.3 0.2 - 1.2 mg/dL   Alkaline Phosphatase 55 39 - 117 U/L   AST 10 0 - 37 U/L   ALT 8 0 - 35 U/L   Total Protein 7.3 6.0 - 8.3 g/dL   Albumin 4.3 3.5 - 5.2 g/dL   GFR 89.88 >60.00 mL/min   Calcium 10.0 8.4 - 10.5 mg/dL  POCT glycosylated hemoglobin (Hb A1C)  Result Value Ref Range   Hemoglobin A1C 7.2 (A) 4.0 - 5.6 %   HbA1c POC (<> result, manual entry)     HbA1c, POC (prediabetic range)     HbA1c, POC (controlled diabetic range)       COVID 19 screen:  No recent travel or known exposure to COVID19 The patient denies respiratory symptoms of COVID 19 at this time. The importance of social distancing was discussed today.   Assessment and Plan    Problem List Items Addressed This Visit     Controlled diabetes mellitus type 2 with complications ([place]) (Chronic)    Chronic, she is now off prednisone so hopefully the diabetes will continue to improve.  At this point though her fasting blood sugar seems to be rising off of Actos and on semaglutide low-dose.  States she is uncomfortable using the semaglutide and does not like being dependent on someone needing  to give her an injection.  My discussed that her rash is not connected to the semaglutide.  She would like to stop the semaglutide and if needed add a different diabetic medication.  She will continue max dose glipizide and metformin for now. I will discuss her care  and DM medication recommendations with our office pharmacist.      Candidal intertrigo - Primary    Acute, improving with application of ketoconazole.  We will also use Diflucan to treat.  Has similar issues intermittently under her breasts in past ( none now) and has requested nystatin powder to apply.      Relevant Medications   fluconazole (DIFLUCAN) 150 MG tablet   nystatin (MYCOSTATIN/NYSTOP) powder    Vulvar itching    Recurrent intermittent issue.  Originally felt to be secondary to [place] resulting in [place] yeast infection. Initially improved off [place] but recurred with antibiotics.  Treated with yeast medication.  Symptoms seem to improve with this even though wet prep returned negative. Now symptoms seem to have flared back up along with new intertrigo.  Will treat vulvar symptoms with topical triamcinolone but at the same time we will treat with Diflucan again.        Kaitlin Lofts, MD

## 2022-06-25 NOTE — Assessment & Plan Note (Signed)
Acute, improving with application of ketoconazole.  We will also use Diflucan to treat.  Has similar issues intermittently under her breasts in past ( none now) and has requested nystatin powder to apply.

## 2022-06-25 NOTE — Patient Instructions (Addendum)
Will send Mychart message about medication change for DM.Marland Kitchen  plan to stop semaglutide.. continue Glipizide and  metformin for now.   Apply topical steroid to external vaginal areafollow with barrier cream   Diflucan 1 tablet x 1 then repeat in 3 days.  Continue ketoconazole to rash under stomach.  Can use Nystatin powder under breast as needed.

## 2022-06-25 NOTE — Assessment & Plan Note (Addendum)
Recurrent intermittent issue.  Originally felt to be secondary to [place] resulting in [place] yeast infection. Initially improved off [place] but recurred with antibiotics.  Treated with yeast medication.  Symptoms seem to improve with this even though wet prep returned negative. Now symptoms seem to have flared back up along with new intertrigo.  Will treat vulvar symptoms with topical triamcinolone but at the same time we will treat with Diflucan again.

## 2022-06-25 NOTE — Assessment & Plan Note (Signed)
Chronic, she is now off prednisone so hopefully the diabetes will continue to improve.  At this point though her fasting blood sugar seems to be rising off of Actos and on semaglutide low-dose.  States she is uncomfortable using the semaglutide and does not like being dependent on someone needing to give her an injection.  My discussed that her rash is not connected to the semaglutide.  She would like to stop the semaglutide and if needed add a different diabetic medication.  She will continue max dose glipizide and metformin for now. I will discuss her care  and DM medication recommendations with our office pharmacist.

## 2022-06-25 NOTE — Telephone Encounter (Signed)
Last office visit today 06/25/22 for rash.  Last refilled 06/02/22 for 30 g with no refills.  Next appt: 09/09/2022 for DM.

## 2022-06-26 ENCOUNTER — Telehealth: Payer: Self-pay

## 2022-06-26 ENCOUNTER — Encounter: Payer: Self-pay | Admitting: Pulmonary Disease

## 2022-06-26 ENCOUNTER — Other Ambulatory Visit: Payer: Self-pay | Admitting: *Deleted

## 2022-06-26 DIAGNOSIS — J849 Interstitial pulmonary disease, unspecified: Secondary | ICD-10-CM | POA: Diagnosis not present

## 2022-06-26 MED ORDER — PREDNISONE 5 MG PO TABS: 5.0000 mg | ORAL_TABLET | Freq: Every day | ORAL | 1 refills | Status: DC

## 2022-06-26 NOTE — Progress Notes (Signed)
Chronic Care Management Pharmacy Assistant   Name: Kaitlin Hamilton  MRN: [id number] DOB: Apr 07, 1951  Reason for Encounter: CCM (Diabetes Disease State)  Recent office visits:  06/25/22 [name], MD Candidal Intertrigo Start: fluconazole (DIFLUCAN) 150 MG tablet Change: nystatin (MYCOSTATIN/NYSTOP) powder 06/06/22 [name], MD DM A1c Potassium repeated 4.5 Start: fluconazole (DIFLUCAN) 150 MG tablet Start: Semaglutide,0.25 or 0.5MG/DOS Stop (not taking): zaleplon (SONATA) 5 MG capsule    Recent consult visits:  06/23/2022 [name], MD (Pulmonology) Interstitial Lung Disease Ordered: Referral for DME Change: Prednisone to 5 mg daily Completed: fluconazole (DIFLUCAN) 150 MG tablet Stop: pioglitazone (ACTOS) 45 MG tablet FU 2 months  06/16/22 Loura Pardon, MD Candidal Intertrigo Start: Ketoconazole 2% Start: fluconazole (DIFLUCAN) 150 MG tablet Not taking: azithromycin (ZITHROMAX) 250 MG tablet Not taking: Dextromethorphan-guaiFENesin (MUCINEX DM) 30-600 MG 12hr tablet  05/29/22 Mannam Praveen, MD (Pulmonology) Critical Lab "K 2.7" Start: potassium chloride SA (KLOR-CON M) 20 MEQ tablet Start: ALPRAZolam (XANAX) 0.25 MG tablet Change: azithromycin (ZITHROMAX) 250 MG tablet Completed: fluconazole (DIFLUCAN) 150 MG tablet Ordered: Chest xray   Hospital visits:  None in previous 6 months  Medications: Outpatient Encounter Medications as of 06/26/2022  Medication Sig   acetaminophen (TYLENOL) 500 MG tablet Take 1,000 mg by mouth every 6 (six) hours as needed for moderate pain.   ALPRAZolam (XANAX) 0.25 MG tablet Take 1 tablet (0.25 mg total) by mouth 2 (two) times daily as needed for anxiety.   Ascorbic Acid (VITAMIN C PO) Take by mouth.   baclofen (LIORESAL) 10 MG tablet Take 1 tablet (10 mg total) by mouth 3 (three) times daily as needed.   FIBER PO Take 1 tablet by mouth daily.   fluticasone (FLONASE) 50 MCG/ACT nasal spray Place 1 spray into both nostrils daily.   furosemide (LASIX)  20 MG tablet Take 1 tablet (20 mg total) by mouth daily. Take one by mouth daily.   glipiZIDE (GLUCOTROL XL) 10 MG 24 hr tablet Take 1 tablet (10 mg total) by mouth daily with breakfast.   glucose blood (ONETOUCH ULTRA) test strip CHECK BLOOD SUGAR DAILY.   hydrochlorothiazide (HYDRODIURIL) 12.5 MG tablet TAKE 1 TABLET DAILY ALONG WITH ONE OF THE LOSARTAN 50 MG TABLETS   ketoconazole (NIZORAL) 2 % cream Apply 1 Application topically daily.   levothyroxine (SYNTHROID) 125 MCG tablet TAKE 1 TABLET BY MOUTH DAILY  BEFORE BREAKFAST   losartan (COZAAR) 50 MG tablet TAKE 1 TABLET DAILY ALONG WITH 1 OF THE HCTZ 12.5MG TABLETS   metFORMIN (GLUCOPHAGE-XR) 500 MG 24 hr tablet Take 4 tablets (2,000 mg total) by mouth daily with breakfast.   MONISTAT 3 COMBINATION PACK 200 & 2 MG-% (9GM) KIT    montelukast (SINGULAIR) 10 MG tablet Take 1 tablet (10 mg total) by mouth at bedtime.   mycophenolate (CELLCEPT) 500 MG tablet Take 1574m twice a day   NON FORMULARY Place 6-8 L into the nose daily. 10 Liters with exertion   nystatin (MYCOSTATIN/NYSTOP) powder Apply 1 Application topically 3 (three) times daily.   OneTouch Delica Lancets 383MMISC CHECK BLOOD SUGAR ONCE DAILY.   pantoprazole (PROTONIX) 40 MG tablet TAKE 1 TABLET BY MOUTH TWICE  DAILY   Pirfenidone 801 MG TABS TAKE 1 TABLET (801MG) BY MOUTH  THREE TIMES DAILY WITH FOOD   potassium chloride SA (KLOR-CON M) 20 MEQ tablet Take 2 tablets (40 mEq total) by mouth daily.   predniSONE (DELTASONE) 20 MG tablet Take 1 tablet (20 mg total) by mouth daily with  breakfast. (Patient not taking: Reported on 06/25/2022)   Probiotic Product (PROBIOTIC DAILY PO) Take by mouth.   rosuvastatin (CRESTOR) 10 MG tablet Take 1 tablet (10 mg total) by mouth daily.   Semaglutide,0.25 or 0.5MG/DOS, (OZEMPIC, 0.25 OR 0.5 MG/DOSE,) 2 MG/3ML SOPN Take 0.25 mg by mouth once a week. 1 injection weekly for diabetes.   triamcinolone cream (KENALOG) 0.1 % Apply 1 Application topically 2  (two) times daily.   verapamil (CALAN-SR) 180 MG CR tablet TAKE 1 TABLET BY MOUTH DAILY   No facility-administered encounter medications on file as of 06/26/2022.    Recent Relevant Labs: Lab Results  Component Value Date/Time   HGBA1C 7.2 (A) 06/06/2022 10:24 AM   HGBA1C 6.9 (H) 02/27/2022 09:08 AM   HGBA1C 6.6 (H) 09/05/2021 11:35 AM   MICROALBUR 5.1 (H) 11/08/2013 08:58 AM    Kidney Function Lab Results  Component Value Date/Time   CREATININE 0.61 06/06/2022 11:16 AM   CREATININE 0.66 05/29/2022 11:16 AM   CREATININE 1.07 07/26/2014 12:29 PM   GFR 89.88 06/06/2022 11:16 AM   GFRNONAA >60 07/17/2019 02:05 PM   GFRNONAA 55 (L) 07/26/2014 12:29 PM   GFRAA >60 07/17/2019 02:05 PM   GFRAA >60 07/26/2014 12:29 PM     Contacted patient on 06/26/2022 to discuss diabetes disease state.   Current antihyperglycemic regimen:  Metformin 57m ER - 4 tab AM  Pioglitazone 45 mg daily Semaglutide,0.25 or 0.5MG/DOS Glipizide 10 mg    Patient verbally confirms she is taking the above medications as directed. Yes  What diet changes have been made to improve diabetes control? Patient watches her sugar intake.   What recent interventions/DTPs have been made to improve glycemic control:  Start: Semaglutide,0.25 or 0.5MG/DOS  Have there been any recent hospitalizations or ED visits since last visit with CPP? No  Patient denies hypoglycemic symptoms, including Pale, Sweaty, Shaky, Hungry, Nervous/irritable, and Vision changes  Patient denies hyperglycemic symptoms, including blurry vision, excessive thirst, fatigue, polyuria, and weakness  How often are you checking your blood sugar? once daily - Patient stated she just saw Dr. BDiona Browneryesterday and her sugar this morning was 155.  During the week, how often does your blood glucose drop below 70? Never  Are you checking your feet daily/regularly? Yes  Adherence Review: Is the patient currently on a STATIN medication? Yes Is the  patient currently on ACE/ARB medication? Yes Does the patient have >5 day gap between last estimated fill dates? No  Care Gaps: Annual wellness visit in last year? Yes 09/05/21 Most Recent BP reading: 122/78 on 06/25/2022  If Diabetic: Most recent A1C reading: 7.2 on 06/06/2022 Last eye exam / retinopathy screening: 04/17/2020 Last diabetic foot exam: Up to date to   Summary of recommendations from last CCM Pharmacy visit (Date:05/28/2022)  Summary: CCM F/U visit -HLD: LDL improved to 73 on rosuvastatin. -DM: A1c 6.9% (02/2022); but pt has stopped FIransince then; she had increased swelling and took Lasix for 1 month (now completed) and she reports swelling has not returned; previously concerned that Actos may be contributing to edema but as swelling is resolved it is reasonable to continue -ECHO 05/08/22 showed EF 60-65% with evidence of Grade I diastolic dysfunction -Started prednisone 20 mg per pulm 10/23   Recommendations/Changes made from today's visit: -Continue to monitor swelling; consider stopping pioglitazone if it returns   Plan: -Pharmacist follow up televisit scheduled for 2 months -Pulmonology appt 05/29/22 -PCP appt 06/06/22   Star Rating Drugs:  Medication:  Last Fill: Day Supply Glipizide 10 mg  06/11/2022 30 Losartan 50 mg 05/30/2022 90 Metformin 500 mg 06/06/2022 90  Rosuvastatin 10 mg 04/24/2022 90 Ozempic 0.25 mg 06/06/2022 28  CCM appointment on 07/28/2022  Charlene Brooke, CPP notified  Marijean Niemann, [place] Clinical Pharmacy Assistant [phone]

## 2022-06-26 NOTE — Telephone Encounter (Signed)
Dr. Vaughan Browner, please advise if ok to send an order for a simple face mask for oxygen and also an oxymizer? Thanks.

## 2022-06-30 ENCOUNTER — Telehealth: Payer: Self-pay | Admitting: *Deleted

## 2022-06-30 MED ORDER — RYBELSUS 3 MG PO TABS: 3.0000 mg | ORAL_TABLET | Freq: Every day | ORAL | 0 refills | Status: DC

## 2022-06-30 NOTE — Progress Notes (Signed)
Ideally I would still recommend avoiding Actos, it has significant correlation with worsening heart failure. If the only reason she does not like Ozempic is the injection aspect and not side effects, she might do well with Rybelsus (semaglutide in pill form). Rybelsus dosing starts at 3 mg x 1 month then increases to 7 mg. If side effects/nausea is the concern, Januvia or Tradjenta would be decent alternatives.  If cost is a concern, I would likely be able to get her assistance for Rybelsus or Tradjenta, but not Januvia.  [name], MD  Charlton Haws, [facility] Hello! I took this lovely lady off the Actos given the diastolic heart failure but she is not liking using semaglutide injections...  she had been doing well on Actos with only a little swelling (now resolved on furosemide 20 mg daily and off Actos). Do we really need to avoid actos? Your thoughts on other options? She does not want injections and did not tolerate farxiga. Thanks for your input!

## 2022-06-30 NOTE — Addendum Note (Signed)
Addended by: [name] E on: 06/30/2022 10:54 AM   Modules accepted: Orders

## 2022-06-30 NOTE — Addendum Note (Signed)
Addended byEliezer Lofts E on: 06/30/2022 10:20 AM   Modules accepted: Orders

## 2022-06-30 NOTE — Telephone Encounter (Signed)
-----   Message from [name], MD sent at 06/30/2022 10:20 AM EST -----  Mendel Ryder, Rybelsus is a great idea! She was tolerating the ozempic but just did not like the injections. I did not realize Rybelsus was available yet.  I can place an order if you will look into medication assistance.  Just local pharmacy?  Butch Penny,  Will you let pt know that Mendel Ryder and I have talked about a replacement for ozempic and will try oral semaglutide?  Thanks!

## 2022-06-30 NOTE — Progress Notes (Signed)
Rybelsus is a great idea. She was tolerating the ozempic but just did not like the injections. I did not realize Rybelsus was available.  I can place an order if you will look into medication assistance.  Just local pharmacy? Looks like you start at 3 mg  daily  for 30 days then increase to 7 mg?

## 2022-06-30 NOTE — Telephone Encounter (Signed)
Ms. Matzke notified as instructed by telephone.  Patient states she is scared of the Ozempic and was hoping they were going to recommend a different diabetic medication.  She states she took three injection of the Ozempic and that is when her skin issues started.  She states she continues to burns when urine hits those lesion areas on her left buttocks.   Now in the crease of her right leg,  that she showed Dr. [name] last week, has 3 more areas that has come up and bleeds.  She states she is doing everything is was advised to do and just don't know what to do.  She states her FBS was 145 mg/dl this morning.    Please advise.

## 2022-06-30 NOTE — Progress Notes (Signed)
Yes local pharmacy is fine, I would send the 3 mg for a month supply. I will start the assistance application for the 7 mg since it will take some time, so by the time she is due for the refill it should arrive from manufacturer.

## 2022-07-01 NOTE — Telephone Encounter (Signed)
Call The Ozempic should not cause the skin issues she had.. that is yeast infection and is not linked with this med. Yeast is linked with the [place] she was on in the past. Also rash did not appear at site of injection ( instead was in groin crease) and did not appear like an allergic reaction.  I do think this is best for her..given there is associated weight loss which will also help her breathing. Let me know if she still does not feel comfortable.

## 2022-07-01 NOTE — Telephone Encounter (Signed)
Ms. Dung notified as instructed by telephone.  She has scheduled an appointment with Dr. [name] for Wednesday 07/02/22 and states she will discuss this further at her appointment.  FYI to Dr. [name].

## 2022-07-02 ENCOUNTER — Encounter: Payer: Self-pay | Admitting: Family Medicine

## 2022-07-02 ENCOUNTER — Ambulatory Visit (INDEPENDENT_AMBULATORY_CARE_PROVIDER_SITE_OTHER): Payer: Medicare Other | Admitting: Family Medicine

## 2022-07-02 VITALS — BP 128/80 | HR 70 | Temp 97.6°F | Ht 65.5 in | Wt 218.0 lb

## 2022-07-02 DIAGNOSIS — E118 Type 2 diabetes mellitus with unspecified complications: Secondary | ICD-10-CM

## 2022-07-02 DIAGNOSIS — R102 Pelvic and perineal pain: Secondary | ICD-10-CM

## 2022-07-02 NOTE — Progress Notes (Signed)
Patient ID: Kaitlin Hamilton, female    DOB: 12-02-1950, 71 y.o.   MRN: [id number]  This visit was conducted in person.  BP 128/80 (BP Location: Left Arm, Patient Position: Sitting)   Pulse 70   Temp 97.6 F (36.4 C) (Skin)   Ht 5' 5.5" (1.664 m)   Wt 218 lb (98.9 kg)   SpO2 98% Comment: 8 liters of oxygen  BMI 35.73 kg/m    CC:  Chief Complaint  Patient presents with   Follow-up    On vaginal issue from last week, and discuss diabetes medication    Subjective:   HPI: Kaitlin Hamilton is a 71 y.o. female presenting on 07/02/2022 for Follow-up (On vaginal issue from last week, and discuss diabetes medication)    Vulvar itching an burning did not improve with taking diflucan a second time.  Using topical  triamcinolone.   Using topical barrier cream. Se is not wearing underwear.  No new exposures.   No antibiotics since last course 1 month ago.  Relevant past medical, surgical, family and social history reviewed and updated as indicated. Interim medical history since our last visit reviewed. Allergies and medications reviewed and updated. Outpatient Medications Prior to Visit  Medication Sig Dispense Refill   acetaminophen (TYLENOL) 500 MG tablet Take 1,000 mg by mouth every 6 (six) hours as needed for moderate pain.     ALPRAZolam (XANAX) 0.25 MG tablet Take 1 tablet (0.25 mg total) by mouth 2 (two) times daily as needed for anxiety. 30 tablet 0   Ascorbic Acid (VITAMIN C PO) Take by mouth.     baclofen (LIORESAL) 10 MG tablet Take 1 tablet (10 mg total) by mouth 3 (three) times daily as needed. 30 tablet 0   FIBER PO Take 1 tablet by mouth daily.     fluticasone (FLONASE) 50 MCG/ACT nasal spray Place 1 spray into both nostrils daily.     furosemide (LASIX) 20 MG tablet Take 1 tablet (20 mg total) by mouth daily. Take one by mouth daily. 30 tablet 2   glipiZIDE (GLUCOTROL XL) 10 MG 24 hr tablet Take 1 tablet (10 mg total) by mouth daily with breakfast. 30 tablet 11   glucose  blood (ONETOUCH ULTRA) test strip CHECK BLOOD SUGAR DAILY. 50 strip 5   hydrochlorothiazide (HYDRODIURIL) 12.5 MG tablet TAKE 1 TABLET DAILY ALONG WITH ONE OF THE LOSARTAN 50 MG TABLETS 90 tablet 1   ketoconazole (NIZORAL) 2 % cream Apply 1 Application topically daily. 15 g 1   levothyroxine (SYNTHROID) 125 MCG tablet TAKE 1 TABLET BY MOUTH DAILY  BEFORE BREAKFAST 100 tablet 0   losartan (COZAAR) 50 MG tablet TAKE 1 TABLET DAILY ALONG WITH 1 OF THE HCTZ 12.5MG TABLETS 90 tablet 1   metFORMIN (GLUCOPHAGE-XR) 500 MG 24 hr tablet Take 4 tablets (2,000 mg total) by mouth daily with breakfast. 360 tablet 1   MONISTAT 3 COMBINATION PACK 200 & 2 MG-% (9GM) KIT      montelukast (SINGULAIR) 10 MG tablet Take 1 tablet (10 mg total) by mouth at bedtime. 90 tablet 1   mycophenolate (CELLCEPT) 500 MG tablet Take 152m twice a day 180 tablet 5   NON FORMULARY Place 6-8 L into the nose daily. 10 Liters with exertion     nystatin (MYCOSTATIN/NYSTOP) powder Apply 1 Application topically 3 (three) times daily. 15 g 0   OneTouch Delica Lancets 307MMISC CHECK BLOOD SUGAR ONCE DAILY. 100 each 3   pantoprazole (PROTONIX) 40 MG  tablet TAKE 1 TABLET BY MOUTH TWICE  DAILY 200 tablet 0   Pirfenidone 801 MG TABS TAKE 1 TABLET (801MG) BY MOUTH  THREE TIMES DAILY WITH FOOD 270 tablet 1   potassium chloride SA (KLOR-CON M) 20 MEQ tablet Take 2 tablets (40 mEq total) by mouth daily. 14 tablet 0   Probiotic Product (PROBIOTIC DAILY PO) Take by mouth.     rosuvastatin (CRESTOR) 10 MG tablet Take 1 tablet (10 mg total) by mouth daily. 90 tablet 3   Semaglutide (RYBELSUS) 3 MG TABS Take 3 mg by mouth daily. 30 tablet 0   triamcinolone cream (KENALOG) 0.1 % Apply 1 Application topically 2 (two) times daily. 30 g 0   verapamil (CALAN-SR) 180 MG CR tablet TAKE 1 TABLET BY MOUTH DAILY 100 tablet 0   predniSONE (DELTASONE) 5 MG tablet Take 1 tablet (5 mg total) by mouth daily with breakfast. (Patient not taking: Reported on  07/02/2022) 30 tablet 1   predniSONE (DELTASONE) 20 MG tablet Take 1 tablet (20 mg total) by mouth daily with breakfast. 30 tablet 1   No facility-administered medications prior to visit.     Per HPI unless specifically indicated in ROS section below Review of Systems  Constitutional:  Negative for fatigue and fever.  HENT:  Negative for congestion.   Eyes:  Negative for pain.  Respiratory:  Negative for cough and shortness of breath.   Cardiovascular:  Negative for chest pain, palpitations and leg swelling.  Gastrointestinal:  Negative for abdominal pain.  Genitourinary:  Positive for genital sores, vaginal discharge and vaginal pain. Negative for dysuria and vaginal bleeding.  Musculoskeletal:  Negative for back pain.  Neurological:  Negative for syncope, light-headedness and headaches.  Psychiatric/Behavioral:  Negative for dysphoric mood.    Objective:  BP 128/80 (BP Location: Left Arm, Patient Position: Sitting)   Pulse 70   Temp 97.6 F (36.4 C) (Skin)   Ht 5' 5.5" (1.664 m)   Wt 218 lb (98.9 kg)   SpO2 98% Comment: 8 liters of oxygen  BMI 35.73 kg/m   Wt Readings from Last 3 Encounters:  07/02/22 218 lb (98.9 kg)  06/25/22 216 lb (98 kg)  06/23/22 213 lb 6.4 oz (96.8 kg)      Physical Exam Exam conducted with a chaperone present.  Constitutional:      General: She is not in acute distress.    Appearance: Normal appearance. She is well-developed. She is obese. She is not ill-appearing or toxic-appearing.  HENT:     Head: Normocephalic.     Right Ear: Hearing, tympanic membrane, ear canal and external ear normal. Tympanic membrane is not erythematous, retracted or bulging.     Left Ear: Hearing, tympanic membrane, ear canal and external ear normal. Tympanic membrane is not erythematous, retracted or bulging.     Nose: No mucosal edema or rhinorrhea.     Right Sinus: No maxillary sinus tenderness or frontal sinus tenderness.     Left Sinus: No maxillary sinus  tenderness or frontal sinus tenderness.     Mouth/Throat:     Pharynx: Uvula midline.  Eyes:     General: Lids are normal. Lids are everted, no foreign bodies appreciated.     Conjunctiva/sclera: Conjunctivae normal.     Pupils: Pupils are equal, round, and reactive to light.  Neck:     Thyroid: No thyroid mass or thyromegaly.     Vascular: No carotid bruit.     Trachea: Trachea normal.  Cardiovascular:  Rate and Rhythm: Normal rate and regular rhythm.     Pulses: Normal pulses.     Heart sounds: Normal heart sounds, S1 normal and S2 normal. No murmur heard.    No friction rub. No gallop.  Pulmonary:     Effort: Pulmonary effort is normal. No tachypnea or respiratory distress.     Breath sounds: Normal breath sounds. No decreased breath sounds, wheezing, rhonchi or rales.  Abdominal:     General: Bowel sounds are normal.     Palpations: Abdomen is soft.     Tenderness: There is no abdominal tenderness.  Genitourinary:    Exam position: Supine.       Comments: 2 areas concerning: Shallow-based ulcers, minimal vaginal discharge Musculoskeletal:     Cervical back: Normal range of motion and neck supple.  Skin:    General: Skin is warm and dry.     Findings: No rash.  Neurological:     Mental Status: She is alert.  Psychiatric:        Mood and Affect: Mood is not anxious or depressed.        Speech: Speech normal.        Behavior: Behavior normal. Behavior is cooperative.        Thought Content: Thought content normal.        Judgment: Judgment normal.       Results for orders placed or performed in visit on 06/06/22  Comp Met (CMET)  Result Value Ref Range   Sodium 139 135 - 145 mEq/L   Potassium 4.5 3.5 - 5.1 mEq/L   Chloride 96 96 - 112 mEq/L   CO2 32 19 - 32 mEq/L   Glucose, Bld 146 (H) 70 - 99 mg/dL   BUN 13 6 - 23 mg/dL   Creatinine, Ser 0.61 0.40 - 1.20 mg/dL   Total Bilirubin 0.3 0.2 - 1.2 mg/dL   Alkaline Phosphatase 55 39 - 117 U/L   AST 10 0 - 37 U/L    ALT 8 0 - 35 U/L   Total Protein 7.3 6.0 - 8.3 g/dL   Albumin 4.3 3.5 - 5.2 g/dL   GFR 89.88 >60.00 mL/min   Calcium 10.0 8.4 - 10.5 mg/dL  POCT glycosylated hemoglobin (Hb A1C)  Result Value Ref Range   Hemoglobin A1C 7.2 (A) 4.0 - 5.6 %   HbA1c POC (<> result, manual entry)     HbA1c, POC (prediabetic range)     HbA1c, POC (controlled diabetic range)       COVID 19 screen:  No recent travel or known exposure to COVID19 The patient denies respiratory symptoms of COVID 19 at this time. The importance of social distancing was discussed today.   Assessment and Plan Problem List Items Addressed This Visit     Controlled diabetes mellitus type 2 with complications ([place]) (Chronic)    She is agreeable to adding oral semaglutide 3 mg daily per pharmacist recommendations.  She will stop semaglutide injections. Patient reassured that this is the best next step and plan for control of her diabetes now that she is off of Actos given increased risk of systolic heart failure in setting of mild diastolic heart failure.      Vaginal pain - Primary    Acute, persistent Symptoms originally started following initiation of Farxiga. She was treated for yeast infection with Diflucan. Symptoms continued but improved.  She then had a course of antibiotics and symptoms flared up.  She was treated again with Diflucan but  wet prep returned negative. Was felt to be secondary to contact dermatitis but has not improved with triamcinolone cream.  On repeat vaginal evaluation today to shallow-based ulcers are noted.  Repeat wet prep sent along with viral swab for HSV.  Of note patient reports an history when she was married she had ulcers that needed to be treated but was unknown what cause.      Relevant Orders   WET PREP BY MOLECULAR PROBE   Herpes simplex virus culture       [name], MD

## 2022-07-02 NOTE — Assessment & Plan Note (Signed)
Acute, persistent Symptoms originally started following initiation of Farxiga. She was treated for yeast infection with Diflucan. Symptoms continued but improved.  She then had a course of antibiotics and symptoms flared up.  She was treated again with Diflucan but wet prep returned negative. Was felt to be secondary to contact dermatitis but has not improved with triamcinolone cream.  On repeat vaginal evaluation today to shallow-based ulcers are noted.  Repeat wet prep sent along with viral swab for HSV.  Of note patient reports an history when she was married she had ulcers that needed to be treated but was unknown what cause.

## 2022-07-02 NOTE — Assessment & Plan Note (Signed)
She is agreeable to adding oral semaglutide 3 mg daily per pharmacist recommendations.  She will stop semaglutide injections. Patient reassured that this is the best next step and plan for control of her diabetes now that she is off of Actos given increased risk of systolic heart failure in setting of mild diastolic heart failure.

## 2022-07-03 ENCOUNTER — Encounter: Payer: Self-pay | Admitting: Family Medicine

## 2022-07-03 ENCOUNTER — Other Ambulatory Visit: Payer: Self-pay | Admitting: Family Medicine

## 2022-07-03 DIAGNOSIS — Z8619 Personal history of other infectious and parasitic diseases: Secondary | ICD-10-CM | POA: Insufficient documentation

## 2022-07-03 MED ORDER — METRONIDAZOLE 0.75 % VA GEL: 1.0000 | Freq: Every day | VAGINAL | 0 refills | Status: DC

## 2022-07-04 DIAGNOSIS — J8489 Other specified interstitial pulmonary diseases: Secondary | ICD-10-CM | POA: Diagnosis not present

## 2022-07-04 DIAGNOSIS — G4733 Obstructive sleep apnea (adult) (pediatric): Secondary | ICD-10-CM | POA: Diagnosis not present

## 2022-07-04 LAB — HERPES SIMPLEX VIRUS CULTURE
MICRO NUMBER:: 14310150
SPECIMEN QUALITY:: ADEQUATE

## 2022-07-04 LAB — WET PREP BY MOLECULAR PROBE
Candida species: NOT DETECTED
MICRO NUMBER:: 14310018
SPECIMEN QUALITY:: ADEQUATE
Trichomonas vaginosis: NOT DETECTED

## 2022-07-07 ENCOUNTER — Encounter: Payer: Self-pay | Admitting: Pulmonary Disease

## 2022-07-07 ENCOUNTER — Telehealth: Payer: Self-pay | Admitting: Family Medicine

## 2022-07-07 DIAGNOSIS — Z7409 Other reduced mobility: Secondary | ICD-10-CM

## 2022-07-07 NOTE — Telephone Encounter (Signed)
Now on metronidazole cream vaginally. NO need for continue triamcinolone.  Using ketoconazole on intertrigo rash

## 2022-07-07 NOTE — Telephone Encounter (Signed)
Ok to order shower chair from DME

## 2022-07-07 NOTE — Telephone Encounter (Signed)
Last office visit 07/02/22 for Vaginal Pain.  Last refilled Nystatin 06/25/22 for 15 g with no refills. Triamcinolone cream 06/26/22 for 30 g with no refills.  Next appt: 09/09/22 for DM.

## 2022-07-08 ENCOUNTER — Telehealth: Payer: Self-pay | Admitting: Family Medicine

## 2022-07-08 ENCOUNTER — Other Ambulatory Visit: Payer: Self-pay | Admitting: Family Medicine

## 2022-07-08 NOTE — Telephone Encounter (Signed)
Patient would like to know if she should use medication Ketoconazole on her bottom as well?

## 2022-07-08 NOTE — Telephone Encounter (Signed)
Kaitlin Hamilton notified as instructed by telephone.  She is asking about using Triamcinolone cream on the lesion that is on the left side of her buttocks.  Per Dr. [name], she should only be using the metronidazole get vaginally,  then she can use the ketoconazole or nystatin cream on the lesions in the crease of her legs and buttocks as well as Vaseline as a barrier.  She should not [place] the triamcinolone at all per Dr. [name].  Patient states understanding.

## 2022-07-09 ENCOUNTER — Other Ambulatory Visit: Payer: Self-pay | Admitting: Family Medicine

## 2022-07-11 ENCOUNTER — Other Ambulatory Visit: Payer: Self-pay | Admitting: Family Medicine

## 2022-07-13 NOTE — Telephone Encounter (Signed)
Last office 07/02/22 for Vaginal Pain.  Last refilled 06/25/22 for 15 g with no refills.  Next Appt: 09/09/22 for DM.  Should we increase quantity to 30 or 60 gram sinces patient ask for frequent refills (15 g is a 5 day supply)?

## 2022-07-17 ENCOUNTER — Other Ambulatory Visit: Payer: Self-pay | Admitting: Family Medicine

## 2022-07-17 NOTE — Telephone Encounter (Signed)
Last office visit 07/02/22 for vaginal pain.  Last refilled 04/24/22 for #30 with no refills.  Next Appt: 09/09/22 for DM.

## 2022-07-23 ENCOUNTER — Telehealth: Payer: Self-pay

## 2022-07-23 NOTE — Assessment & Plan Note (Signed)
Continue glipizide and metformin.  Stop Actos.  Start ozempic low dose weekly.  Call with blood sugar measurements aft 1-2 weeks.

## 2022-07-23 NOTE — Progress Notes (Signed)
Reason for Encounter: Appointment Reminder  Contacted patient on 07/23/2022   Medications: Outpatient Encounter Medications as of 07/23/2022  Medication Sig   acetaminophen (TYLENOL) 500 MG tablet Take 1,000 mg by mouth every 6 (six) hours as needed for moderate pain.   ALPRAZolam (XANAX) 0.25 MG tablet Take 1 tablet (0.25 mg total) by mouth 2 (two) times daily as needed for anxiety.   Ascorbic Acid (VITAMIN C PO) Take by mouth.   baclofen (LIORESAL) 10 MG tablet Take 1 tablet (10 mg total) by mouth 3 (three) times daily as needed.   FIBER PO Take 1 tablet by mouth daily.   fluticasone (FLONASE) 50 MCG/ACT nasal spray Place 1 spray into both nostrils daily.   furosemide (LASIX) 20 MG tablet Take 1 tablet (20 mg total) by mouth daily. Take one by mouth daily. (Patient not taking: Reported on 07/02/2022)   glipiZIDE (GLUCOTROL XL) 10 MG 24 hr tablet Take 1 tablet (10 mg total) by mouth daily with breakfast.   glucose blood (ONETOUCH ULTRA) test strip CHECK BLOOD SUGAR DAILY.   hydrochlorothiazide (HYDRODIURIL) 12.5 MG tablet TAKE 1 TABLET DAILY ALONG WITH ONE OF THE LOSARTAN 50 MG TABLETS   ketoconazole (NIZORAL) 2 % cream Apply 1 Application topically daily.   levothyroxine (SYNTHROID) 125 MCG tablet TAKE 1 TABLET BY MOUTH DAILY  BEFORE BREAKFAST   losartan (COZAAR) 50 MG tablet TAKE 1 TABLET DAILY ALONG WITH 1 OF THE HCTZ 12.'5MG'$  TABLETS   metFORMIN (GLUCOPHAGE-XR) 500 MG 24 hr tablet Take 4 tablets (2,000 mg total) by mouth daily with breakfast.   metroNIDAZOLE (METROGEL) 0.75 % vaginal gel Place 1 Applicatorful vaginally at bedtime.   montelukast (SINGULAIR) 10 MG tablet Take 1 tablet (10 mg total) by mouth at bedtime.   mycophenolate (CELLCEPT) 500 MG tablet Take '1500mg'$  twice a day   NON FORMULARY Place 6-8 L into the nose daily. 10 Liters with exertion   nystatin (MYCOSTATIN/NYSTOP) powder Apply 1 Application topically 3 (three) times daily.   OneTouch Delica Lancets 85U MISC CHECK BLOOD  SUGAR ONCE DAILY.   pantoprazole (PROTONIX) 40 MG tablet TAKE 1 TABLET BY MOUTH TWICE  DAILY   Pirfenidone 801 MG TABS TAKE 1 TABLET ('801MG'$ ) BY MOUTH  THREE TIMES DAILY WITH FOOD   potassium chloride SA (KLOR-CON M) 20 MEQ tablet Take 2 tablets (40 mEq total) by mouth daily.   predniSONE (DELTASONE) 5 MG tablet Take 1 tablet (5 mg total) by mouth daily with breakfast. (Patient not taking: Reported on 07/02/2022)   Probiotic Product (PROBIOTIC DAILY PO) Take by mouth.   rosuvastatin (CRESTOR) 10 MG tablet Take 1 tablet (10 mg total) by mouth daily.   Semaglutide (RYBELSUS) 3 MG TABS Take 3 mg by mouth daily.   verapamil (CALAN-SR) 180 MG CR tablet TAKE 1 TABLET BY MOUTH DAILY   No facility-administered encounter medications on file as of 07/23/2022.   Lab Results  Component Value Date/Time   HGBA1C 7.2 (A) 06/06/2022 10:24 AM   HGBA1C 6.9 (H) 02/27/2022 09:08 AM   HGBA1C 6.6 (H) 09/05/2021 11:35 AM   MICROALBUR 5.1 (H) 11/08/2013 08:58 AM    BP Readings from Last 3 Encounters:  07/02/22 128/80  06/25/22 122/78  06/23/22 126/64      Patient contacted to confirm telephone appointment with Charlene Brooke, PharmD on 07/28/2022 at 11:00.  Do you have any problems getting your medications? No  What is your top health concern you would like to discuss at your upcoming visit? No concerns  Have  you seen any other providers since your last visit with PCP? No  Star Rating Drugs:  Medication:  Last Fill: Day Supply Glipizide 10 mg           07/08/2022      30 Losartan 50 mg           05/30/2022      90 Metformin 500 mg       06/06/2022      90         Rosuvastatin 10 mg    04/24/2022      90 Ozempic 0.25 mg        06/30/2022      28  Care Gaps: Annual wellness visit in last year? Yes 09/05/21   If Diabetic: Last eye exam / retinopathy screening: 04/17/2020  Last diabetic foot exam: Up to date  Charlene Brooke, CPP notified  Kaitlin Hamilton, [facility]  Assistant [phone]

## 2022-07-23 NOTE — Assessment & Plan Note (Signed)
Acute. Had initially improved with stopping [place] and treating with Diflucan Wet prep was negative so I recommended triamcinolone twice daily for remaining vaginal irritation.  Now in the last week she has had some burning off and on again but she is now back on antibiotics.  Will treat for presumed recurrent vaginal candidiasis

## 2022-07-23 NOTE — Assessment & Plan Note (Signed)
Due for re-eval. 

## 2022-07-27 DIAGNOSIS — J849 Interstitial pulmonary disease, unspecified: Secondary | ICD-10-CM | POA: Diagnosis not present

## 2022-07-28 ENCOUNTER — Telehealth: Payer: Medicare Other

## 2022-07-28 ENCOUNTER — Other Ambulatory Visit: Payer: Self-pay | Admitting: Family Medicine

## 2022-07-28 ENCOUNTER — Ambulatory Visit: Payer: Medicare Other | Admitting: Pharmacist

## 2022-07-28 NOTE — Patient Instructions (Signed)
Visit Information  Phone number for Pharmacist: [phone]  Thank you for meeting with me to discuss your medications! Below is a summary of what we talked about during the visit:   Recommendations/Changes made from today's visit: -Increase Rybelsus to 7 mg after 1st month as previously planned - shipment is on its way from manufacturer  Follow up plan: -[place] will call patient 1 month for DM update -Pharmacist follow up televisit scheduled for 2 months -PCP appt 09/09/22   Charlene Brooke, PharmD, BCACP Clinical Pharmacist [facility] [phone]

## 2022-07-28 NOTE — Progress Notes (Signed)
Care Management & Coordination Services Pharmacy Note  07/28/2022 Name:  Kaitlin Hamilton MRN:  [id number] DOB:  10-14-1950  Summary: F/U visit -DM: A1c 7.2% (05/2022), pt has stopped Actos and started Rybelsus since then. She reports BG has been higher on Rybelsus 3 mg, discussed the 3 mg starting dose is not an effective dose and we plan to increase to 7 mg once she completes the first month -Rybelsus 7 mg PAP was approved 07/25/22 and has been shipped. Pt did request refill for Rybelsus 3 mg today, advised her to cancel this refill and await shipment from manufacturer   Recommendations/Changes made from today's visit: -Increase Rybelsus to 7 mg after 1st month as previously planned - shipment is on its way from manufacturer  Follow up plan: -Jud will call patient 1 month for DM update -Pharmacist follow up televisit scheduled for 2 months -PCP appt 09/09/22    Subjective: Kaitlin Hamilton is an 72 y.o. year old female who is a primary patient of Bedsole, Amy E, MD.  The care coordination team was consulted for assistance with disease management and care coordination needs.    Engaged with patient by telephone for follow up visit.  Patient Care Team: [name], MD as PCP - General (Family Medicine) Juanito Doom, MD as Consulting Physician (Pulmonary Disease) Charlton Haws, [facility] as Pharmacist (Pharmacist)  Recent office visits: 07/02/22 Dr [name] OV: vaginal pain - Rx metronidazole gel. agreeable to Rybelsus oral.   06/25/22 Dr [name] OV: candidal intertrigo. Rx fluconazole. Also change Ozempic to Rybelsus 3 mg. Started PAP application.  06/16/22 Dr Glori Bickers OV: candidal intertrigo - Rx fluconazole 150 mg x 2 doses. Rx ketoconazole cream.  06/06/22 Dr [name] OV: f/u DM - A1c 7.2%. Stop Actos. Start Ozempic 0.25 mg. Remain off lasix.   Recent consult visits: 06/23/22 Dr Vaughan Browner (Pulmonary): ILD - family delaying Rituxan until January d/t groin  inflammation.  05/29/22 Dr Vaughan Browner (Pulmonary): ILD. PFTs w/ worsening restriction, unfortunately worsening ILD. Add Rituxan. Sinus congestion - rx z-pak. Worsening anxiety - rx alprazolam. Discussed goals of care, no decision on DNR yet. K 2.7 - rx Klor Con 40 meq x 7 days, recheck CMP 1 week.  Hospital visits: None in previous 6 months   Objective:  Lab Results  Component Value Date   CREATININE 0.61 06/06/2022   BUN 13 06/06/2022   GFR 89.88 06/06/2022   GFRNONAA >60 07/17/2019   GFRAA >60 07/17/2019   NA 139 06/06/2022   K 4.5 06/06/2022   CALCIUM 10.0 06/06/2022   CO2 32 06/06/2022   GLUCOSE 146 (H) 06/06/2022    Lab Results  Component Value Date/Time   HGBA1C 7.2 (A) 06/06/2022 10:24 AM   HGBA1C 6.9 (H) 02/27/2022 09:08 AM   HGBA1C 6.6 (H) 09/05/2021 11:35 AM   GFR 89.88 06/06/2022 11:16 AM   GFR 88.20 05/29/2022 11:16 AM   MICROALBUR 5.1 (H) 11/08/2013 08:58 AM    Last diabetic Eye exam:  Lab Results  Component Value Date/Time   HMDIABEYEEXA No Retinopathy 04/17/2020 12:00 AM    Last diabetic Foot exam:  Lab Results  Component Value Date/Time   HMDIABFOOTEX done 09/05/2021 12:00 AM     Lab Results  Component Value Date   CHOL 215 (H) 02/27/2022   HDL 106.60 02/27/2022   LDLCALC 73 02/27/2022   LDLDIRECT 104.0 09/04/2017   TRIG 178.0 (H) 02/27/2022   CHOLHDL 2 02/27/2022       Latest Ref Rng &  Units 06/06/2022   11:16 AM 05/29/2022   11:16 AM 04/25/2022    9:56 AM  Hepatic Function  Total Protein 6.0 - 8.3 g/dL 7.3  7.2  7.2   Albumin 3.5 - 5.2 g/dL 4.3  4.3  4.3   AST 0 - 37 U/L '10  12  10   '$ ALT 0 - 35 U/L '8  8  7   '$ Alk Phosphatase 39 - 117 U/L 55  55  63   Total Bilirubin 0.2 - 1.2 mg/dL 0.3  0.3  0.4     Lab Results  Component Value Date/Time   TSH 2.06 11/08/2021 12:51 PM   TSH 1.36 09/05/2021 11:35 AM   FREET4 1.17 09/05/2021 11:35 AM   FREET4 1.16 07/26/2020 11:18 AM       Latest Ref Rng & Units 05/29/2022   11:16 AM 04/25/2022     9:56 AM 02/14/2022   11:58 AM  CBC  WBC 4.0 - 10.5 K/uL 15.0  8.2  8.3   Hemoglobin 12.0 - 15.0 g/dL 11.3  11.5  12.5   Hematocrit 36.0 - 46.0 % 36.1  37.1  39.9   Platelets 150.0 - 400.0 K/uL 279.0  246.0  266.0     Lab Results  Component Value Date/Time   VITAMINB12 423 08/01/2013 05:10 PM    Clinical ASCVD: No  The ASCVD Risk score (Arnett DK, et al., 2019) failed to calculate for the following reasons:   The valid HDL cholesterol range is 20 to 100 mg/dL       06/06/2022   11:13 AM 09/05/2021   11:18 AM 09/05/2021   10:23 AM  Depression screen PHQ 2/9  Decreased Interest '3 1 1  '$ Down, Depressed, Hopeless 3 0 0  PHQ - 2 Score '6 1 1  '$ Altered sleeping 0 3 3  Tired, decreased energy '3 3 3  '$ Change in appetite '2 3 3  '$ Feeling bad or failure about yourself  '3 1 1  '$ Trouble concentrating '3 1 1  '$ Moving slowly or fidgety/restless 0 0 0  Suicidal thoughts 0 0 0  PHQ-9 Score '17 12 12  '$ Difficult doing work/chores Extremely dIfficult      Social History   Tobacco Use  Smoking Status Never   Passive exposure: Past  Smokeless Tobacco Never   BP Readings from Last 3 Encounters:  07/02/22 128/80  06/25/22 122/78  06/23/22 126/64   Pulse Readings from Last 3 Encounters:  07/02/22 70  06/25/22 82  06/23/22 94   Wt Readings from Last 3 Encounters:  07/02/22 218 lb (98.9 kg)  06/25/22 216 lb (98 kg)  06/23/22 213 lb 6.4 oz (96.8 kg)   BMI Readings from Last 3 Encounters:  07/02/22 35.73 kg/m  06/25/22 35.40 kg/m  06/23/22 35.51 kg/m    Allergies  Allergen Reactions   Adhesive [Tape]     Redness, bruising with any adhesives- bandaids, patches, etc.    Atovaquone Itching   Codeine Hives and Swelling   Demerol [Meperidine] Hives and Swelling   Hydrocodone Nausea Only   Augmentin [Amoxicillin-Pot Clavulanate] Other (See Comments)    Upsets stomach   Azithromycin Nausea And Vomiting    Upset stomach   Trazodone And Nefazodone Itching   Sulfa Antibiotics Rash     Medications Reviewed Today     Reviewed by Charlton Haws, [facility] (Pharmacist) on 07/28/22 at 1125  Med List Status: <None>   Medication Order Taking? Sig Documenting Provider Last Dose Status Informant  acetaminophen (TYLENOL)  500 MG tablet [id number] Yes Take 1,000 mg by mouth every 6 (six) hours as needed for moderate pain. [provider] Taking Active Family Member  ALPRAZolam Duanne Moron) 0.25 MG tablet [id number] Yes Take 1 tablet (0.25 mg total) by mouth 2 (two) times daily as needed for anxiety. [name], MD Taking Active   Ascorbic Acid (VITAMIN C PO) [id number] Yes Take by mouth. [provider] Taking Active Self  baclofen (LIORESAL) 10 MG tablet [id number] Yes Take 1 tablet (10 mg total) by mouth 3 (three) times daily as needed. [name], MD Taking Active   FIBER PO [id number] Yes Take 1 tablet by mouth daily. [provider] Taking Active Family Member  fluticasone (FLONASE) 50 MCG/ACT nasal spray [id number] Yes Place 1 spray into both nostrils daily. [provider] Taking Active   furosemide (LASIX) 20 MG tablet [id number] Yes Take 1 tablet (20 mg total) by mouth daily. Take one by mouth daily. [name], MD Taking Active   glipiZIDE (GLUCOTROL XL) 10 MG 24 hr tablet [id number] Yes Take 1 tablet (10 mg total) by mouth daily with breakfast. [name], Amy E, MD Taking Active   glucose blood (ONETOUCH ULTRA) test strip [id number] Yes CHECK BLOOD SUGAR DAILY. [name], MD Taking Active   hydrochlorothiazide (HYDRODIURIL) 12.5 MG tablet [id number] Yes TAKE 1 TABLET DAILY ALONG WITH ONE OF THE LOSARTAN 50 MG TABLETS Bedsole, Amy E, MD Taking Active   ketoconazole (NIZORAL) 2 % cream [id number] Yes Apply 1 Application topically daily. Tower, Wynelle Fanny, MD Taking Active   levothyroxine (SYNTHROID) 125 MCG tablet [id number] Yes TAKE 1 TABLET BY MOUTH DAILY  BEFORE BREAKFAST Bedsole, Amy E, MD Taking Active   losartan (COZAAR) 50 MG tablet  [id number] Yes TAKE 1 TABLET DAILY ALONG WITH 1 OF THE HCTZ 12.'5MG'$  TABLETS Bedsole, Amy E, MD Taking Active   metFORMIN (GLUCOPHAGE-XR) 500 MG 24 hr tablet [id number] Yes Take 4 tablets (2,000 mg total) by mouth daily with breakfast. [name], Amy E, MD Taking Active   metroNIDAZOLE (METROGEL) 0.75 % vaginal gel [id number] Yes Place 1 Applicatorful vaginally at bedtime. [name], MD Taking Active   montelukast (SINGULAIR) 10 MG tablet [id number] Yes Take 1 tablet (10 mg total) by mouth at bedtime. [name], MD Taking Active   mycophenolate (CELLCEPT) 500 MG tablet [id number] Yes Take '1500mg'$  twice a day [name], MD Taking Active   NON FORMULARY [id number] Yes Place 6-8 L into the nose daily. 10 Liters with exertion [provider] Taking Active Family Member  nystatin (MYCOSTATIN/NYSTOP) powder [id number] Yes Apply 1 Application topically 3 (three) times daily. [name], MD Taking Active   OneTouch Delica Lancets 58I [place] [id number] Yes CHECK BLOOD SUGAR ONCE DAILY. [name], MD Taking Active   pantoprazole (PROTONIX) 40 MG tablet [id number] Yes TAKE 1 TABLET BY MOUTH TWICE  DAILY Bedsole, Amy E, MD Taking Active   Pirfenidone 801 MG TABS [id number] Yes TAKE 1 TABLET ('801MG'$ ) BY MOUTH  THREE TIMES DAILY WITH FOOD Mannam, Praveen, MD Taking Active   predniSONE (DELTASONE) 5 MG tablet [id number] Yes Take 1 tablet (5 mg total) by mouth daily with breakfast. [name], MD Taking Active   Probiotic Product (PROBIOTIC DAILY PO) [id number] Yes Take by mouth. [provider] Taking Active Self  rosuvastatin (CRESTOR) 10 MG tablet [id number] Yes Take 1 tablet (10 mg total) by mouth daily. [name], MD Taking Active   Semaglutide [facility]) 7 MG TABS [id number] Yes Take  7 mg by mouth daily before breakfast. Take 30 min before breakfast with 4oz of water [provider]  Active            Med Note Luna Glasgow Jul 28, 2022 11:21 AM) Via [facility]  C[facility] PAP  verapamil (CALAN-SR) 180 MG CR tablet [id number] Yes TAKE 1 TABLET BY MOUTH DAILY [name], MD Taking Active             Patient Active Problem List   Diagnosis Date Noted   History of herpes genitalis 07/03/2022   Vaginal pain 07/02/2022   Chronic diastolic heart failure ([place]) 06/06/2022   Acute cystitis without hematuria 04/03/2022   Neck pain on left side 03/05/2021   Lumbar back pain with radiculopathy affecting left lower extremity 01/23/2021   Urinary frequency 03/16/2019   Vulvar itching 03/16/2019   Nausea vomiting and diarrhea 02/21/2019   Controlled diabetes mellitus type 2 with complications ([place]) 44/96/7591   MDD (recurrent major depressive disorder) in remission ([place]) 12/08/2017   Chronic insomnia 12/08/2017   UTI due to Klebsiella species 06/22/2017   Candidal intertrigo 07/08/2016   Right sciatic nerve pain 03/09/2015   Chronic pain of left thumb 03/09/2015   Allergic rhinitis 10/06/2014   Low blood potassium 05/25/2014   Chronic hypoxemic respiratory failure ([place]) 01/31/2014   Cystocele 11/11/2013   Hypertension associated with diabetes ([place]) 08/25/2013   Hyperlipidemia associated with type 2 diabetes mellitus ([place]) 08/25/2013   Common migraine 08/25/2013   Hypothyroidism 08/25/2013   OSA (obstructive sleep apnea) 08/22/2013   ILD (interstitial lung disease) ([place]) 06/29/2013   GERD (gastroesophageal reflux disease) 06/01/2013   Esophageal dysmotility 05/17/2013   UIP (usual interstitial pneumonitis) ([place]) 10/19/2012    Immunization History  Administered Date(s) Administered   Fluad Quad(high Dose 65+) 04/03/2020, 04/15/2021, 06/23/2022   Influenza Split 05/02/2005, 11/28/2014   Influenza Whole 04/20/2012   Influenza, High Dose Seasonal PF 04/16/2018   Influenza,inj,Quad PF,6+ Mos 05/17/2013, 04/04/2014, 10/06/2014, 05/11/2015, 10/19/2015, 03/12/2016, 04/30/2017, 10/14/2017, 03/16/2019   Influenza-Unspecified 04/20/2013   Moderna  Covid-19 Vaccine Bivalent Booster 92yr & up 04/29/2021   Moderna Sars-Covid-2 Vaccination 08/20/2019, 09/19/2019, 05/17/2020   Pneumococcal Conjugate-13 04/04/2014   Pneumococcal Polysaccharide-23 04/20/2013, 04/16/2018   Rsv, Bivalent, Protein Subunit Rsvpref,pf ([place] 06/23/2022   Tdap 07/21/2009     Compliance/Adherence/Medication fill history: Care Gaps: Eye exam Mammogram UACR  Star-Rating Drugs: Losartan - PDC 100% Rosuvastatin - PDC 100% Metformin - PDC 100% Glipizide - PDC 100% Rybelsus - PAP  SDOH:  (Social Determinants of Health) assessments and interventions performed: Yes SDOH Interventions    Flowsheet Row Care Coordination from 07/28/2022 in [place] from 10/29/2021 [facility] [place] from 09/05/2021 in [place] from 10/03/2020 in [place]Visit from 01/24/2020 in [place]inf[place] 07/12/2019 in [place] SMonticelloInterventions -- Intervention Not Indicated -- -- -- --  Housing Interventions Intervention Not Indicated -- -- -- -- --  Depression Interventions/Treatment  -- -- Medication, Counseling -- Patient refuses Treatment PHQ2-9 Score <4 Follow-up Not Indicated  Financial Strain Interventions Other (Comment)  [approved for Rybelsus PAP 07/25/22] Intervention Not Indicated -- Intervention Not Indicated  [Medications affordable] -- --      SDOH Screenings   Food Insecurity: No Food Insecurity (11/07/2021)  Housing: Low Risk  (07/28/2022)  Transportation Needs: No Transportation Needs (07/12/2019)  Alcohol Screen: Low Risk  (07/12/2019)  Depression (PHQ2-9): High Risk (06/06/2022)  Financial Resource Strain: Low Risk  (07/28/2022)  Physical Activity: Inactive (07/12/2019)  Stress: No Stress Concern Present (07/12/2019)   Tobacco Use: Low Risk  (07/02/2022)    Medication Assistance:  [place] approved 2024  Medication Access: Within the past 30 days, how often has patient missed a dose of medication? 0 Is a pillbox or other method used to improve adherence? Yes  Factors that may affect medication adherence? financial need Are meds synced by current pharmacy? No  Are meds delivered by current pharmacy? No  Does patient experience delays in picking up medications due to transportation concerns? No   Upstream Services Reviewed: Is patient disadvantaged to use [facility] Pharmacy?: No  Current Rx insurance plan: [facility] Name and location of Current pharmacy:  [place], [place] [address] Phone: [phone] Fax: [phone]  [facility] - [place], [place] [address] [place] [address] Phone: [phone] Fax: [phone]  [place], [place] [address] Phone: [phone] Fax: [phone]  [facility] Pharmacy services reviewed with patient today?: No  Patient requests to transfer care to Upstream Pharmacy?: No  Reason patient declined to change pharmacies: Loyalty to other pharmacy/Patient preference   Assessment/Plan  Hyperlipidemia (LDL goal < 100) -Controlled - LDL 73 (02/2022), improved after switching to rosuvastatin -Current treatment: Rosuvastatin 10 mg daily - Appropriate, Effective, Safe, Accessible -Medications previously tried: Atorvastatin (muscle pain) -Educated on Cholesterol goals; Benefits of statin for ASCVD risk reduction; -Recommend to continue current medication  Hypertension (BP goal <140/90) -Controlled - via clinic readings; Denies hypotensive/hypertensive symptoms -ECHO 04/2022 - EF 74-12%; grade I diastolic dysfunction; pt completed 74-monthcourse of furosemide  and swelling has resolved -Current treatment: HCTZ 12.5 mg daily - Appropriate, Effective, Safe, Accessible Verapamil ER 180 mg daily -Appropriate, Effective, Safe, Accessible Losartan 50 mg daily -Appropriate, Effective, Safe, Accessible Furosemide 20 mg daily - completed course; removed from list -Medications previously tried: none  -Monitor BP at home periodically -Recommended to continue current medication  Diabetes (A1c goal <7%) -Query Controlled - A1c 7.2% (05/2022), pt has stopped Actos and started Rybelsus since then. She reports BG has been higher on Rybelsus 3 mg, discussed the 3 mg starting dose not an effective dose and we plan to increase to 7 mg once she completes the first month -Pt does report some GI upset after eating some meals; discussed avoiding foods that trigger this -Current home glucose readings - checking 1-2x daily 10am 200, 5pm 117 9am 235, 2pm 110 8am 170 -Current medications: Metformin '500mg'$  ER - 4 tab AM -Appropriate, Effective, Safe, Accessible Glipizide XL 10 mg daily AM - Appropriate, Effective, Safe, Accessible Rybelsus 3 mg daily - Appropriate, Query Effective -Medications previously tried: FIran(mycotic infxn), Actos (HF) -Current exercise: minimal due to lung condition -Discussed Rybelsus PAP was approved 07/25/22, they will ship 7 mg dose to office within a week or 2 -Discussed BG goals; pt is concerned about BG in 200s even when she is not eating much, discussed glucose regulation in liver and effect of stress on blood glucose levels -Recommend to continue current medication; increase Rybelsus to 7 mg as previously planned  UIP (usual  interstitial pneumonitis) (Goal: manage symptoms) -Progressive. Pt will be starting Rituxan per pulmonology shortly. -Hx OSA, wearing CPAP nightly; follows with pulmonology -Current treatment  Pirfenidone 801 mg TID -Appropriate, Effective, Safe, Accessible Mycophenolate 500 mg - 3 tab BID -Appropriate,  Effective, Safe, Accessible Rituxan - not started yet Oxygen 8 L -Medications previously tried: n/a  -Recommended to continue current medication  Depression / Insomnia (Goal: manage symptoms) -Not ideally controlled - pt recently was prescribed Xanax to help with anxiety related to osygen desaturations -PHQ9: 12 (08/2021) - moderate depression -GAD7 3 (08/2021) - minimal anxiety -Current treatment  Alprazolam 0.25 mg PRN - Appropriate, Effective, Safe, Accessible -Medications previously tried: trazodone (SE), nortriptyline  -Recommend to continue current medication  Health Maintenance -Vaccine gaps: Shingrix, TDAP, Covid   Charlene Brooke, PharmD, BCACP Clinical Pharmacist [facility] [phone]

## 2022-07-31 ENCOUNTER — Telehealth: Payer: Self-pay | Admitting: *Deleted

## 2022-07-31 NOTE — Telephone Encounter (Signed)
Received from [facility]:  Rybelsus 7 mg.  Lot [id number] Exp: 11/18/2023 1 box with #30 tablets.  Ms. Hobart notified by telephone that medication can be picked up at the front desk.

## 2022-07-31 NOTE — Telephone Encounter (Signed)
Patient sister Lenoard Aden came in office to pick up medication for patient

## 2022-08-04 DIAGNOSIS — G4733 Obstructive sleep apnea (adult) (pediatric): Secondary | ICD-10-CM | POA: Diagnosis not present

## 2022-08-04 DIAGNOSIS — J8489 Other specified interstitial pulmonary diseases: Secondary | ICD-10-CM | POA: Diagnosis not present

## 2022-08-05 ENCOUNTER — Ambulatory Visit: Payer: Medicare Other

## 2022-08-07 ENCOUNTER — Telehealth: Payer: Self-pay | Admitting: Family Medicine

## 2022-08-07 ENCOUNTER — Other Ambulatory Visit: Payer: Self-pay | Admitting: Pulmonary Disease

## 2022-08-07 NOTE — Telephone Encounter (Signed)
Patient called in and stated that her Semaglutide (RYBELSUS) 7 MG TABS was increased to 7 MG. She stated since the increase she has been experiencing diarrhea and nausea since Saturday. Sent over to access nurse.

## 2022-08-07 NOTE — Telephone Encounter (Addendum)
Pt has not gotten cb from access nurse; I spoke with pt; Rybelsus was increased on 08/02/22 to 7 mg. Pt started with rybelsus 3 mg and pt had diarrhea but the diarrhea went away. Pt said when rybelsus was increased to '7mg'$  she  had N&V, diarrhea, and abd pain of pain level 10 on 08/02/22,08/03/22 and 08/04/22. Pt said on 08/05/22 abd pain was at pain level of 5 and today no abd pain. Last vomited on 08/05/22 and last diarrhea was only 1 time on 08/06/22. Pt has no fever. Pt said she is still very nauseated; pt is trying to drink small amts of decaf tea, gingerale and water. Pt urinated about 30 mins ago and urine was not dark in color, but was smaller amt urinated than usual.pt said she has been taking a fluid pill due to swelling in lower legs for a few days. Pt said swelling in lower legs go down over night but swell again as morning goes on. Pt has dry mouth and dizziness on and off.pt said 08/07/22 at 9:30 am FBS was 150. At 12:30 pt was shaking and took BS that was 74. Pt ate small cup of jello. That was all pt could take in due to nausea. BS now is 115 and pt said she does not have any diabetic symptoms; pt said earlier the shakiness might have not been due to lower BS because pt said she is shaky a lot of time. Pt said no CP but pt has SOB due to lung disorder; pt said SOB worse when pt up walking to kitchen or bathroom. This morning pt is on 9L oxygen but when up walking pulse ox was 69 -70 %; after resting oxygen level goes up. Pt said pulmonology is aware.pt does not want to go to ED. I spoke with Dr [name]; pt is to hold Rybelsus and call office on 08/12/22 with update on how pt is feeling. Dr [name] will call in med for nausea for pt to [employer]. Pt does want med for nausea sent to [facility]. Pt needs to increase fluids and if pt does not get better pt should go to ED and pt voiced understanding and appreciative. Reviewed UC & ED precautions again and pt voiced understanding. Sending note to Dr  [name] (reminder pt does want med for nausea sent to [facility]. Thank you.   [facility] - Client TELEPHONE ADVICE RECORD AccessNurse Patient Name: Kaitlin Hamilton Gender: Female DOB: [birth year] Age: 72 Y 31 M 9 D Return Phone Number: [phone] (Primary) Address: City/ State/ Zip: [place] [address] Client [facility] - Client Client Site [place] - Day Provider [name] - MD Contact Type Call Who Is Calling Patient / Member / Family / Caregiver Call Type Triage / Clinical Relationship To Patient Self Return Phone Number [phone] (Primary) Chief Complaint Diarrhea Reason for Call Symptomatic / Request for [place] states her medication was increased and she is now having nausea and diarrhea. Translation No No Triage Reason Patient declined Nurse Assessment Nurse: Lucky Cowboy, RN, Levada Dy Date/Time Eilene Ghazi Time): 08/07/2022 3:28:22 PM Confirm and document reason for call. If symptomatic, describe symptoms. ---Caller stated that she has already spoken with MD and declined triage. Does the patient have any new or worsening symptoms? ---Yes Will a triage be completed? ---No Select reason for no triage. ---Patient declined Disp. Time Eilene Ghazi Time) Disposition Final User 08/07/2022  12:33:05 PM Send To Nurse Ria Comment, RN, April 08/07/2022 1:28:51 PM FINAL ATTEMPT MADE - message left Lucky Cowboy, RN, Levada Dy 08/07/2022 1:29:20 PM Send to RN Final Attempt Margot Chimes, RN, Levada Dy 08/07/2022 3:29:17 PM Clinical Call Yes Lucky Cowboy, RN, Levada Dy Final Disposition 08/07/2022 3:29:17 PM Clinical Call Yes Lucky Cowboy, RN, Glenard Haring

## 2022-08-07 NOTE — Telephone Encounter (Signed)
Noted  

## 2022-08-07 NOTE — Telephone Encounter (Signed)
Pt states she was told earlier that she was going to get some meds prescribed for her nausea. Pt states she visited pharmacy & they stated they haven't received any prescription for the pt. Call bacl # [phone]

## 2022-08-07 NOTE — Telephone Encounter (Signed)
Pt is requesting Xanax refill. Dr. Vaughan Browner please advise

## 2022-08-08 ENCOUNTER — Other Ambulatory Visit: Payer: Self-pay | Admitting: Pulmonary Disease

## 2022-08-08 ENCOUNTER — Encounter: Payer: Self-pay | Admitting: Family Medicine

## 2022-08-08 MED ORDER — ONDANSETRON HCL 4 MG PO TABS: 4.0000 mg | ORAL_TABLET | Freq: Three times a day (TID) | ORAL | 0 refills | Status: DC | PRN

## 2022-08-08 NOTE — Telephone Encounter (Signed)
Please advise on refill request

## 2022-08-08 NOTE — Telephone Encounter (Signed)
Notify patient medication for nausea has now been sent to in.

## 2022-08-08 NOTE — Addendum Note (Signed)
Addended byEliezer Lofts E on: 08/08/2022 01:14 PM   Modules accepted: Orders

## 2022-08-08 NOTE — Telephone Encounter (Signed)
Called and informed pt that medication has been called into pharmacy.

## 2022-08-12 ENCOUNTER — Telehealth: Payer: Self-pay | Admitting: Family Medicine

## 2022-08-12 NOTE — Telephone Encounter (Signed)
Spoke with Ms. Collison.  She states Dr. [name] had wanted her to call back with update after stopping the Rybelsus.  She states she picked up the Zofran Friday evening.  She is not eating much but everything she does eat runs straight through her.  She is still having loose stools but the nausea/vomiting has stopped as of Sunday.   Please advise.

## 2022-08-12 NOTE — Telephone Encounter (Signed)
Patient called and she stated she was told to call back about her sugar pill. Call back number [phone].

## 2022-08-12 NOTE — Telephone Encounter (Signed)
Noted, keep up with liquids to keep up with diarrhea.  She can use Imodium if needed for loose stool.  Give symptoms more time to resolve off of the medication. Please also document where her fasting blood sugars have been here lately.

## 2022-08-13 NOTE — Telephone Encounter (Signed)
Noted  

## 2022-08-13 NOTE — Telephone Encounter (Signed)
Patient notified as instructed by telephone and verbalized understanding. Patient stated that her blood sugar has been doing a lot better lately. Patient stated that her fasting blood sugar this morning was 110.

## 2022-08-14 ENCOUNTER — Other Ambulatory Visit: Payer: Self-pay | Admitting: Family Medicine

## 2022-08-15 ENCOUNTER — Other Ambulatory Visit: Payer: Self-pay | Admitting: Pulmonary Disease

## 2022-08-15 DIAGNOSIS — J849 Interstitial pulmonary disease, unspecified: Secondary | ICD-10-CM

## 2022-08-15 NOTE — Telephone Encounter (Signed)
Refill sent for PIRFENIDONE to [facility]: [phone]   Dose: 801 mg three times daily  Last OV: 06/23/2022 Provider: Dr. Vaughan Browner  Next OV: 08/29/2022  LFTs on 06/06/2022 - stable  Knox Saliva, PharmD, MPH, BCPS Clinical Pharmacist (Rheumatology and Pulmonology)

## 2022-08-18 ENCOUNTER — Other Ambulatory Visit: Payer: Self-pay | Admitting: Family Medicine

## 2022-08-18 DIAGNOSIS — J3089 Other allergic rhinitis: Secondary | ICD-10-CM

## 2022-08-21 ENCOUNTER — Other Ambulatory Visit: Payer: Self-pay | Admitting: Pulmonary Disease

## 2022-08-21 NOTE — Telephone Encounter (Signed)
Alprazolam 0.'25mg'$ , take 1 tab, 2 times daily as needed for anxiety.  Last refill 08/08/22, #30 with no refills.

## 2022-08-25 ENCOUNTER — Other Ambulatory Visit: Payer: Self-pay | Admitting: Pulmonary Disease

## 2022-08-25 ENCOUNTER — Other Ambulatory Visit: Payer: Self-pay | Admitting: Family Medicine

## 2022-08-27 DIAGNOSIS — J849 Interstitial pulmonary disease, unspecified: Secondary | ICD-10-CM | POA: Diagnosis not present

## 2022-08-29 ENCOUNTER — Encounter: Payer: Self-pay | Admitting: Pulmonary Disease

## 2022-08-29 ENCOUNTER — Other Ambulatory Visit: Payer: Self-pay | Admitting: Pulmonary Disease

## 2022-08-29 ENCOUNTER — Other Ambulatory Visit: Payer: Self-pay | Admitting: Family Medicine

## 2022-08-29 ENCOUNTER — Telehealth: Payer: Self-pay | Admitting: Pulmonary Disease

## 2022-08-29 ENCOUNTER — Ambulatory Visit: Payer: Medicare Other | Admitting: Pulmonary Disease

## 2022-08-29 VITALS — BP 138/70 | HR 109 | Temp 97.6°F | Ht 66.0 in | Wt 205.8 lb

## 2022-08-29 DIAGNOSIS — Z5181 Encounter for therapeutic drug level monitoring: Secondary | ICD-10-CM

## 2022-08-29 DIAGNOSIS — J849 Interstitial pulmonary disease, unspecified: Secondary | ICD-10-CM | POA: Diagnosis not present

## 2022-08-29 MED ORDER — BELSOMRA 5 MG PO TABS: 5.0000 mg | ORAL_TABLET | Freq: Every evening | ORAL | 0 refills | Status: DC | PRN

## 2022-08-29 NOTE — Telephone Encounter (Signed)
Spoke with daughter advised Dr. Vaughan Browner sent in [place] instead of Ambien due to insurance preference. She verbalized understanding. Nothing further needed.

## 2022-08-29 NOTE — Patient Instructions (Signed)
Continue the CellCept and supplemental oxygen I will send in a prescription for Ambien for sleep If you are ready to start Rituxan then we can schedule it Follow-up in 3 months

## 2022-08-29 NOTE — Telephone Encounter (Signed)
Dr Vaughan Browner- pt's daughter calling bc Ambien not sent to pharmacy yet  They would like to pick this up asap  Please send rx to [facility], thanks!

## 2022-08-29 NOTE — Addendum Note (Signed)
Addended byMarshell Garfinkel on: 08/29/2022 04:41 PM   Modules accepted: Orders

## 2022-08-29 NOTE — Telephone Encounter (Signed)
Looks like Ambien is not preferred by her insurance so I sent an alternate medication for sleep called Belsomra

## 2022-08-29 NOTE — Progress Notes (Signed)
Kaitlin Hamilton    LK:[id number]    [birth year]  Primary Care Physician:Bedsole, Mervyn Gay, MD  Referring Physician: [name], MD [place],  [place] [address]  Chief complaint:  Follow-up for interstitial lung disease On CellCept, Esbriet [started 2018]  HPI: 72 y.o.  with history of biopsy-proven UIP pulmonary fibrosis.  Previously followed by Dr. [name] lung biopsy in December 2014 with findings showing UIP fibrosis.  There is strong suspicion for underlying connective tissue disease as she responds to prednisone. She has also been evaluated by Dr. [name] [name] at [facility]. Esbriet started in 2018 for progressive pulmonary fibrosis. She got a dose of evusheld for COVID prophylaxis in April 2022  She has had 2 episodes of diverticulitis in 2019 and 2020, E. coli UTI in 2020 and Campylobacter colitis in December 2020. We reduced her CellCept to 500 mg twice daily due to recurrent episodes of UTI however her breathing got worse and she is back now at 1 g twice daily with stabilization of symptoms  2023 We had ordered home sleep study in 2023 but patient did not want to go through that as it required taking the test on room air  Reports gradually worsening dyspnea on exertion in 2023 with increased fatigue.  In July 2023 we increased CellCept to 1.5 g twice daily and started prednisone.  She had some issues with prednisone due to side effects of insomnia  Interim history: Continues on Esbriet and CellCept Continues to have significant dyspnea on exertion.  She is on 8 L oxygen and has frequent desats.  Ordered Rituxan at last visit but family wants to delay at least until January 2024 as she is dealing with groin and vulval inflammation and infection which is managed by primary care.  They would like her to get better before starting new immunosuppressive therapy.  Overall she feels that she is declining slowly and notes frequent desats.  Outpatient  Encounter Medications as of 08/29/2022  Medication Sig   acetaminophen (TYLENOL) 500 MG tablet Take 1,000 mg by mouth every 6 (six) hours as needed for moderate pain.   ALPRAZolam (XANAX) 0.25 MG tablet Take 1 tablet (0.25 mg total) by mouth 2 (two) times daily as needed for anxiety.   Ascorbic Acid (VITAMIN C PO) Take by mouth.   baclofen (LIORESAL) 10 MG tablet Take 1 tablet (10 mg total) by mouth 3 (three) times daily as needed.   FIBER PO Take 1 tablet by mouth daily.   fluticasone (FLONASE) 50 MCG/ACT nasal spray Place 1 spray into both nostrils daily.   furosemide (LASIX) 20 MG tablet Take 1 tablet (20 mg total) by mouth daily. Take one by mouth daily.   glipiZIDE (GLUCOTROL XL) 10 MG 24 hr tablet Take 1 tablet (10 mg total) by mouth daily with breakfast.   glucose blood (ONETOUCH ULTRA) test strip CHECK BLOOD SUGAR DAILY.   hydrochlorothiazide (HYDRODIURIL) 12.5 MG tablet TAKE 1 TABLET DAILY ALONG WITH ONE OF THE LOSARTAN 50 MG TABLETS   ketoconazole (NIZORAL) 2 % cream Apply 1 Application topically daily.   levothyroxine (SYNTHROID) 125 MCG tablet TAKE 1 TABLET BY MOUTH DAILY  BEFORE BREAKFAST   losartan (COZAAR) 50 MG tablet TAKE 1 TABLET DAILY ALONG WITH 1 OF THE HCTZ 12.5MG TABLETS   metFORMIN (GLUCOPHAGE-XR) 500 MG 24 hr tablet Take 4 tablets (2,000 mg total) by mouth daily with breakfast.   metroNIDAZOLE (METROGEL) 0.75 % vaginal gel  Place 1 Applicatorful vaginally at bedtime.   montelukast (SINGULAIR) 10 MG tablet Take 1 tablet (10 mg total) by mouth at bedtime.   mycophenolate (CELLCEPT) 500 MG tablet Take 1514m twice a day   NON FORMULARY Place 6-8 L into the nose daily. 10 Liters with exertion   nystatin (MYCOSTATIN/NYSTOP) powder Apply 1 Application topically 3 (three) times daily.   ondansetron (ZOFRAN) 4 MG tablet Take 1 tablet (4 mg total) by mouth every 8 (eight) hours as needed for nausea or vomiting.   OneTouch Delica Lancets 399991111MISC CHECK BLOOD SUGAR ONCE DAILY.    pantoprazole (PROTONIX) 40 MG tablet TAKE 1 TABLET BY MOUTH TWICE  DAILY   Pirfenidone 801 MG TABS TAKE 1 TABLET (801MG) BY MOUTH  THREE TIMES DAILY WITH FOOD   predniSONE (DELTASONE) 5 MG tablet Take 1 tablet (5 mg total) by mouth daily with breakfast.   Probiotic Product (PROBIOTIC DAILY PO) Take by mouth.   rosuvastatin (CRESTOR) 10 MG tablet Take 1 tablet (10 mg total) by mouth daily.   verapamil (CALAN-SR) 180 MG CR tablet TAKE 1 TABLET BY MOUTH DAILY   Semaglutide (RYBELSUS) 7 MG TABS Take 7 mg by mouth daily before breakfast. Take 30 min before breakfast with 4oz of water (Patient not taking: Reported on 08/29/2022)   No facility-administered encounter medications on file as of 08/29/2022.   Physical Exam: Blood pressure 138/70, pulse (!) 109, temperature 97.6 F (36.4 C), temperature source Oral, height 5' 6"$  (1.676 m), weight 205 lb 12.8 oz (93.4 kg), SpO2 95 %. Gen:      No acute distress HEENT:  EOMI, sclera anicteric Neck:     No masses; no thyromegaly Lungs:    Clear to auscultation bilaterally; normal respiratory effort CV:         Regular rate and rhythm; no murmurs Abd:      + bowel sounds; soft, non-tender; no palpable masses, no distension Ext:    No edema; adequate peripheral perfusion Skin:      Warm and dry; no rash Neuro: alert and oriented x 3 Psych: normal mood and affect   Data Reviewed: Imaging: 11/2012 CT chest >>  centrilobular nodules, interlobular septal thickening worse in bases and periphery R lung > L; some GGO in bases and periphery as well, some bronchiectasis in the bases R > L; findings suggestive of fibrosis but not UIP; question hypersensitivity pneumonitis given centrilobular nodules; also question aspiration   04/2013 Barium swallow> abnormal esophageal motility, GERD, hiatal hernia   04/2013 CT chest ([name])> findings suggestive of but not diagnostic of NSIP, small pulmonary nodule  February 2016 CT chest high resolution> slight progression in  reticular abnormalities consistent with pulmonary fibrosis, uip  High-resolution CT chest 08/04/2019-basilar predominant fibrotic interstitial lung disease with mild honeycombing.  Mild progressions 2016.  UIP pattern  High-resolution CT 03/09/2021-stable UIP pattern pulmonary fibrosis  CT high-resolution 02/07/2022-UIP pattern pulmonary fibrosis stable compared to 2022 however overall progressed since 2016. I reviewed the images personally   PFTs: 04/28/2018 FVC 1.1 [35%], FEV1 1.02 [42%], F/F 92, DLCO 8.88 [36%] Severe restriction, diffusion impairment. With worsening compared to prior years.  10/12/2019 FVC 1.13 [36%], FEV1 1.05 [44%], F/F 93, DLCO 8.76 [44%] Severe restriction, diffusion impairment.  Stable compared to 2019  09/28/2020 FVC 1.03 [33%], FEV1 0.97 [41%], DLCO 7.94 [40%] Severe restriction, diffusion impairment  02/14/2022 FVC 0.99 [33%], FEV1 0.95 [41%], F/F 96, TLC 2.12 [41%], DLCO 6.87 [35%] Severe restriction, diffusion impairment.  Worsening compared to 2022  6 minute walk 10/19/2012 walked 500 feet in office on room air oxygenation did not drop below 90% 04/2013 6MW RA > 1100 feet, HR peak 109, O2 sat Nadir 87% 11/2013 6MW 1364 feet, O2 saturation nadir 78% 08/21/14 6MW 1400 feet 90% on 8L  Labs: 03/2013 ANA, ANCA, Anti-Jo-1, ESR, RF, SCL-70, anti-centromere, SSA/SSB, all negative; CRP 0.6;   07/17/2019-CBC within normal limits 07/26/2020-hepatic panel within normal limits  Cardiac: Echocardiogram 05/08/2022-LVEF 60 to 65%, normal RV size and function.  Normal PA systolic pressure.  Estimated RVSP 36  Assessment:  UIP fibrosis Presumed autoimmune component given response to immunosuppressive's in the past Currently maintained on CellCept 1.5 g twice daily, pirfenidone and prednisone Not on PJP prophylaxis due to allergies to sulfa drugs and atovaquone Recent labs are okay.  Notes worsening dyspnea and fatigue.  Although CT is stable for the past 1 year  clinically she is worse and PFTs show worsening restriction and diffusion impairment.  There is no pulmonary hypertension on echocardiogram. Unfortunately this may be just worsening of her ILD which in fact has been progressive since 2016.   We have recommended Rituxan therapy as there are recent reports that Rituxan plus CellCept shows improvement in connective tissue disease related ILD (European Respiratory Journal 2023; DOI: 10.1183/13993003.02071-2022).  Family still considering whether to proceed with that  Continue prednisone at 5 mg/day Lasix as needed for leg swelling.  Anxiety, insomnia Xanax as needed Add low-dose Ambien  OSA Continue CPAP  Goals of care We discussed goals of care and she is not ready to give up yet.  We discussed DNR status also but no decision has been made I had recommended palliative care consult but they want to hold off for now.  Plan/Recommendations: Continue pirfenidone, CellCept, prednisone 5 mg/day Start Rituxan Xanax for anxiety Add low-dose Ambien  [name] MD [facility] 08/29/2022, 10:36 AM  CC: [name], MD

## 2022-09-01 ENCOUNTER — Telehealth: Payer: Self-pay | Admitting: Pulmonary Disease

## 2022-09-01 ENCOUNTER — Other Ambulatory Visit: Payer: Self-pay | Admitting: Pharmacy Technician

## 2022-09-01 ENCOUNTER — Other Ambulatory Visit: Payer: Self-pay

## 2022-09-01 MED ORDER — MYCOPHENOLATE MOFETIL 250 MG PO CAPS: ORAL_CAPSULE | ORAL | 0 refills | Status: DC

## 2022-09-01 NOTE — Telephone Encounter (Signed)
Called and spoke with [facility] [employer].  Cellcept is currently on backorder and they requested a 1 time Cellcept 230m prescription with no refills to cover patient until 5052mtabs are back in stock.  Patient is on Cellcept 150081mwice a day.  Cellcept 1500m47mice daily prescription, [facility]

## 2022-09-03 ENCOUNTER — Other Ambulatory Visit: Payer: Self-pay | Admitting: Pulmonary Disease

## 2022-09-03 NOTE — Telephone Encounter (Signed)
Alprazolam 0.52m, take 1 tab twice daily as needed for anxiety, #30 with no refills. Last prescription 08/21/22.

## 2022-09-04 DIAGNOSIS — G4733 Obstructive sleep apnea (adult) (pediatric): Secondary | ICD-10-CM | POA: Diagnosis not present

## 2022-09-04 DIAGNOSIS — J8489 Other specified interstitial pulmonary diseases: Secondary | ICD-10-CM | POA: Diagnosis not present

## 2022-09-08 ENCOUNTER — Ambulatory Visit (INDEPENDENT_AMBULATORY_CARE_PROVIDER_SITE_OTHER): Payer: Medicare Other

## 2022-09-08 VITALS — BP 113/67 | HR 102 | Temp 97.7°F | Ht 66.0 in | Wt 202.3 lb

## 2022-09-08 DIAGNOSIS — Z0000 Encounter for general adult medical examination without abnormal findings: Secondary | ICD-10-CM

## 2022-09-08 NOTE — Progress Notes (Signed)
Subjective:   Kaitlin Hamilton is a 72 y.o. female who presents for Medicare Annual (Subsequent) preventive examination.  Review of Systems    No ROS.  Medicare Wellness Virtual Visit.  Visual/audio telehealth visit, UTA vital signs.   See social history for additional risk factors.   Cardiac Risk Factors include: advanced age (>43mn, >>43women);diabetes mellitus;hypertension     Objective:    Today's Vitals   09/08/22 1044  BP: 113/67  Pulse: (!) 102  Temp: 97.7 F (36.5 C)  Weight: 202 lb 4.8 oz (91.8 kg)  Height: 5' 6"$  (1.676 m)   Body mass index is 32.65 kg/m.     09/08/2022   11:03 AM 01/17/2021    3:34 PM 07/12/2019   12:07 PM 07/08/2018   11:58 AM 06/02/2017   11:50 AM 01/29/2016    9:35 AM 04/27/2014   10:30 PM  Advanced Directives  Does Patient Have a Medical Advance Directive? No No No No No No No  Would patient like information on creating a medical advance directive? No - Patient declined  No - Patient declined No - Patient declined  Yes - Educational materials given No - patient declined information    Current Medications (verified) Outpatient Encounter Medications as of 09/08/2022  Medication Sig   acetaminophen (TYLENOL) 500 MG tablet Take 1,000 mg by mouth every 6 (six) hours as needed for moderate pain.   ALPRAZolam (XANAX) 0.25 MG tablet Take 1 tablet (0.25 mg total) by mouth 2 (two) times daily as needed for anxiety.   Ascorbic Acid (VITAMIN C PO) Take by mouth.   baclofen (LIORESAL) 10 MG tablet Take 1 tablet (10 mg total) by mouth 3 (three) times daily as needed.   FIBER PO Take 1 tablet by mouth daily.   fluticasone (FLONASE) 50 MCG/ACT nasal spray Place 1 spray into both nostrils daily.   furosemide (LASIX) 20 MG tablet Take 1 tablet (20 mg total) by mouth daily. Take one by mouth daily.   glipiZIDE (GLUCOTROL XL) 10 MG 24 hr tablet Take 1 tablet (10 mg total) by mouth daily with breakfast.   glucose blood (ONETOUCH ULTRA) test strip CHECK BLOOD  SUGAR DAILY.   hydrochlorothiazide (HYDRODIURIL) 12.5 MG tablet TAKE 1 TABLET DAILY ALONG WITH ONE OF THE LOSARTAN 50 MG TABLETS   ketoconazole (NIZORAL) 2 % cream Apply 1 Application topically daily.   levothyroxine (SYNTHROID) 125 MCG tablet TAKE 1 TABLET BY MOUTH DAILY  BEFORE BREAKFAST   losartan (COZAAR) 50 MG tablet TAKE 1 TABLET DAILY ALONG WITH 1 OF THE HCTZ 12.5MG TABLETS   metFORMIN (GLUCOPHAGE-XR) 500 MG 24 hr tablet Take 4 tablets (2,000 mg total) by mouth daily with breakfast.   metroNIDAZOLE (METROGEL) 0.75 % vaginal gel Place 1 Applicatorful vaginally at bedtime.   montelukast (SINGULAIR) 10 MG tablet Take 1 tablet (10 mg total) by mouth at bedtime.   mycophenolate (CELLCEPT) 250 MG capsule Take 15061mtwice daily   mycophenolate (CELLCEPT) 500 MG tablet Take 150015m TABS) twice a day   NON FORMULARY Place 6-8 L into the nose daily. 10 Liters with exertion   nystatin (MYCOSTATIN/NYSTOP) powder Apply 1 Application topically 3 (three) times daily.   ondansetron (ZOFRAN) 4 MG tablet Take 1 tablet (4 mg total) by mouth every 8 (eight) hours as needed for nausea or vomiting.   OneTouch Delica Lancets [id number] CHECK BLOOD SUGAR ONCE DAILY.   pantoprazole (PROTONIX) 40 MG tablet TAKE 1 TABLET BY MOUTH TWICE  DAILY   Pirfenidone  801 MG TABS TAKE 1 TABLET (801MG) BY MOUTH  THREE TIMES DAILY WITH FOOD   predniSONE (DELTASONE) 5 MG tablet Take 1 tablet (5 mg total) by mouth daily with breakfast.   Probiotic Product (PROBIOTIC DAILY PO) Take by mouth.   rosuvastatin (CRESTOR) 10 MG tablet Take 1 tablet (10 mg total) by mouth daily.   Semaglutide (RYBELSUS) 7 MG TABS Take 7 mg by mouth daily before breakfast. Take 30 min before breakfast with 4oz of water (Patient not taking: Reported on 08/29/2022)   Suvorexant (BELSOMRA) 5 MG TABS Take 1 tablet (5 mg total) by mouth at bedtime as needed.   verapamil (CALAN-SR) 180 MG CR tablet TAKE 1 TABLET BY MOUTH DAILY   No facility-administered  encounter medications on file as of 09/08/2022.    Allergies (verified) Adhesive [tape], Atovaquone, Codeine, Demerol [meperidine], Hydrocodone, Augmentin [amoxicillin-pot clavulanate], Azithromycin, Trazodone and nefazodone, and Sulfa antibiotics   History: Past Medical History:  Diagnosis Date   Allergic rhinitis    uses Flonase daily   Anesthesia complication    Per pt/ past endoscopy in 2015, the anesthetic spray in back throat caused her to have breathing problems   Anxiety    Bronchitis    Chicken pox    Constipation    takes Fiber daily   Depression    Diverticulosis    Dysphagia    GERD (gastroesophageal reflux disease)    takes Protonix daily   H/O hiatal hernia    Hemorrhoids    History of bronchitis    2013   History of colon polyps    History of kidney stones    Hyperlipidemia    lost 43 pounds and no meds required at present   Hypertension    takes Verapamil and HCTZ daily   Hypokalemia    Hypothyroidism (acquired)    takes Synthroid daily   Insomnia    doesn't take any meds for this   Joint swelling    left thumb   Migraines    last one about a month ago   Non-insulin dependent type 2 diabetes mellitus (HCC)    takes Glipizide daily   OA (osteoarthritis)    left knee   Oxygen deficiency    Oxygen dependent    Pt is on continuous O2 at 3 liters!   Pneumonia    last time in 2006   PONV (postoperative nausea and vomiting)    Shortness of breath    with exertion;takes Singulair daily as well as Flonase   Urinary frequency    Past Surgical History:  Procedure Laterality Date   ABDOMINAL EXPLORATION SURGERY     For Ovarian Cyst    APPENDECTOMY     CHOLECYSTECTOMY     COLONOSCOPY  05/2013   TA, severe diverticulosis, rpt 3 yrs ([facility])   ENTEROSCOPY N/A 08/12/2013   Procedure: ENTEROSCOPY;  Surgeon: Lafayette Dragon, MD;  Location: [facility];  Service: Endoscopy;  Laterality: N/A;   ESOPHAGOGASTRODUODENOSCOPY     left knee arthroscopy      LITHOTRIPSY     x 2   LUNG BIOPSY Right 06/29/2013   Procedure: LUNG BIOPSY;  Surgeon: Melrose Nakayama, MD;  Location: [facility];  Service: Thoracic;  Laterality: Right;   TCS     TUBAL LIGATION     VIDEO ASSISTED THORACOSCOPY Right 06/29/2013   Procedure: VIDEO ASSISTED THORACOSCOPY;  Surgeon: Melrose Nakayama, MD;  Location: [facility];  Service: Thoracic;  Laterality: Right;   Family History  Problem Relation Age of Onset   Rheum arthritis Mother    Rheum arthritis Sister    Autoimmune disease Sister    Prostate cancer Brother    Heart disease Brother    Uterine cancer Daughter    Heart disease Maternal Grandmother    Heart disease Maternal Grandfather    Colon cancer Neg Hx    Breast cancer Neg Hx    Social History   Socioeconomic History   Marital status: Widowed    Spouse name: Not on file   Number of children: 2   Years of education: Not on file   Highest education level: Not on file  Occupational History   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   Smoking status: Never    Passive exposure: Past   Smokeless tobacco: Never  Vaping Use   Vaping Use: Never used  Substance and Sexual Activity   Alcohol use: No    Alcohol/week: 0.0 standard drinks of alcohol   Drug use: No   Sexual activity: Never    Birth control/protection: Post-menopausal  Other Topics Concern   Not on file  Social History Narrative   Daily caffeine     Exercise: none recently    Diet: poor   Social Determinants of Health   Financial Resource Strain: Low Risk  (09/08/2022)   Overall Financial Resource Strain (CARDIA)    Difficulty of Paying Living Expenses: Not very hard  Food Insecurity: No Food Insecurity (09/08/2022)   Hunger Vital Sign    Worried About Running Out of Food in the Last Year: Never true    Ran Out of Food in the Last Year: Never true  Transportation Needs: No Transportation Needs (09/08/2022)   PRAPARE - Hydrologist (Medical): No    Lack  of Transportation (Non-Medical): No  Physical Activity: Unknown (09/08/2022)   Exercise Vital Sign    Days of Exercise per Week: 0 days    Minutes of Exercise per Session: Not on file  Stress: No Stress Concern Present (09/08/2022)   [place]    Feeling of Stress : Not at all  Social Connections: Unknown (09/08/2022)   Social Connection and Isolation Panel [NHANES]    Frequency of Communication with Friends and Family: More than three times a week    Frequency of Social Gatherings with Friends and Family: More than three times a week    Attends Religious Services: Not on Advertising copywriter or Organizations: Not on file    Attends Archivist Meetings: Not on file    Marital Status: Not on file    Tobacco Counseling Counseling given: Not Answered   Clinical Intake:  Pre-visit preparation completed: Yes        Diabetes: Yes (Followed by pcp)  How often do you need to have someone help you when you read instructions, pamphlets, or other written materials from your doctor or pharmacy?: 1 - Never  Nutrition Risk Assessment: Does the patient have any non-healing wounds?  No   Diabetes: Is the patient diabetic?  Yes  If diabetic, was a CBG obtained today?  Yes , FBS 179 Did the patient bring in their glucometer from home?  No  How often do you monitor your CBG's? Every morning.   Financial Strains and Diabetes Management: Are you having any financial strains with the device, your supplies or your medication? No .  Does  the patient want to be seen by Chronic Care Management for management of their diabetes?  No  Would the patient like to be referred to a Nutritionist or for Diabetic Management?  No     Interpreter Needed?: No    Activities of Daily Living    09/08/2022   10:48 AM  In your present state of health, do you have any difficulty performing the following activities:   Hearing? 0  Vision? 0  Difficulty concentrating or making decisions? 0  Walking or climbing stairs? 1  Comment Paces self with activity  Dressing or bathing? 0  Doing errands, shopping? 1  Preparing Food and eating ? N  Comment Some assist with meal prep. Self feeds.  Using the Toilet? N  In the past six months, have you accidently leaked urine? N  Do you have problems with loss of bowel control? N  Managing your Medications? N  Managing your Finances? N  Comment Daughter assist as needed  Housekeeping or managing your Housekeeping? Y    Patient Care Team: [name], MD as PCP - General (Family Medicine) Juanito Doom, MD as Consulting Physician (Pulmonary Disease) Charlton Haws, [facility] as Pharmacist (Pharmacist)  Indicate any recent Medical Services you may have received from other than [facility] providers in the past year (date may be approximate).     Assessment:   This is a routine wellness examination for Kaitlin Hamilton.  I connected with  Kaitlin Hamilton on 09/08/22 by a audio enabled telemedicine application and verified that I am speaking with the correct person using two identifiers.  Patient Location: Home  Provider Location: Office/Clinic  I discussed the limitations of evaluation and management by telemedicine. The patient expressed understanding and agreed to proceed.   Hearing/Vision screen Hearing Screening - Comments:: Patient is able to hear conversational tones without difficulty.  No issues reported.   Vision Screening - Comments:: Followed by [facility] Wears corrective lenses     Dietary issues and exercise activities discussed: Current Exercise Habits: Home exercise routine, Intensity: Mild She tries to have a healthy diet   Goals Addressed               This Visit's Progress     Patient Stated     Complete health maintenance (pt-stated)        Schedule eye exam Schedule mammogram exam       DIET - INCREASE LEAN PROTEINS  (pt-stated)        Add protein shake as a meal supplement        Depression Screen    09/08/2022   10:52 AM 06/06/2022   11:13 AM 09/05/2021   11:18 AM 09/05/2021   10:23 AM 08/21/2020    4:08 PM 01/24/2020   11:17 AM 07/12/2019   12:09 PM  PHQ 2/9 Scores  PHQ - 2 Score 0 6 1 1 2 4 1  $ PHQ- 9 Score  17 12 12 9 10 1    $ Fall Risk    09/08/2022   11:04 AM 09/05/2021   10:06 AM 08/21/2020    3:15 PM 07/12/2019   12:08 PM 02/04/2019   10:58 AM  Fall Risk   Falls in the past year? 0 0 0 0 0  Number falls in past yr: 0   0   Injury with Fall? 0   0   Risk for fall due to :    Medication side effect   Follow up Falls evaluation completed;Falls  prevention discussed   Falls evaluation completed;Falls prevention discussed Falls evaluation completed    FALL RISK PREVENTION PERTAINING TO THE HOME: Home free of loose throw rugs in walkways, pet beds, electrical cords, etc? Yes  Adequate lighting in your home to reduce risk of falls? Yes   ASSISTIVE DEVICES UTILIZED TO PREVENT FALLS: Life alert? Yes  Use of a cane, walker or w/c? No  Grab bars in the bathroom? Yes  Shower chair or bench in shower? Yes  Comfort chair height toilet? Yes   TIMED UP AND GO: Was the test performed? No .   Cognitive Function:    07/12/2019   12:11 PM 07/08/2018   11:58 AM 06/02/2017   11:51 AM  MMSE - Mini Mental State Exam  Orientation to time 5 5 5  $ Orientation to Place 5 5 5  $ Registration 3 3 3  $ Attention/ Calculation 5 0 0  Recall 3 3 3  $ Language- name 2 objects  0 0  Language- repeat 1 1 1  $ Language- follow 3 step command  3 3  Language- read & follow direction  0 0  Write a sentence  0 0  Copy design  0 0  Total score  20 20        09/08/2022   10:55 AM  6CIT Screen  What Year? 0 points  What month? 0 points  What time? 0 points  Count back from 20 0 points  Months in reverse 0 points  Repeat phrase 2 points  Total Score 2 points    Immunizations Immunization History   Administered Date(s) Administered   Fluad Quad(high Dose 65+) 04/03/2020, 04/15/2021, 06/23/2022   Influenza Split 05/02/2005, 11/28/2014   Influenza Whole 04/20/2012   Influenza, High Dose Seasonal PF 04/16/2018   Influenza,inj,Quad PF,6+ Mos 05/17/2013, 04/04/2014, 10/06/2014, 05/11/2015, 10/19/2015, 03/12/2016, 04/30/2017, 10/14/2017, 03/16/2019   Influenza-Unspecified 04/20/2013   Moderna Covid-19 Vaccine Bivalent Booster 20yr & up 04/29/2021   Moderna Sars-Covid-2 Vaccination 08/20/2019, 09/19/2019, 05/17/2020   Pneumococcal Conjugate-13 04/04/2014   Pneumococcal Polysaccharide-23 04/20/2013, 04/16/2018   Rsv, Bivalent, Protein Subunit Rsvpref,pf (Evans Lance 06/23/2022   Tdap 07/21/2009    TDAP status: Due, Education has been provided regarding the importance of this vaccine. Advised may receive this vaccine at local pharmacy or Health Dept. Aware to provide a copy of the vaccination record if obtained from local pharmacy or Health Dept. Verbalized acceptance and understanding.   Shingrix Completed?: No.    Education has been provided regarding the importance of this vaccine. Patient has been advised to call insurance company to determine out of pocket expense if they have not yet received this vaccine. Advised may also receive vaccine at local pharmacy or Health Dept. Verbalized acceptance and understanding.  Screening Tests Health Maintenance  Topic Date Due   Diabetic kidney evaluation - Urine ACR  11/09/2014   DTaP/Tdap/Td (2 - Td or Tdap) 07/22/2019   FOOT EXAM  09/05/2022   COVID-19 Vaccine (5 - 2023-24 season) 09/24/2022 (Originally 03/21/2022)   MAMMOGRAM  10/20/2022 (Originally 05/20/2022)   OPHTHALMOLOGY EXAM  10/20/2022 (Originally 04/17/2021)   Zoster Vaccines- Shingrix (1 of 2) 12/07/2022 (Originally 11/26/1969)   HEMOGLOBIN A1C  12/05/2022   Diabetic kidney evaluation - eGFR measurement  06/07/2023   Medicare Annual Wellness (AWV)  09/09/2023   DEXA SCAN  12/24/2026    Pneumonia Vaccine 72 Years old  Completed   INFLUENZA VACCINE  Completed   Hepatitis C Screening  Completed   HPV VACCINES  Aged Out  Health Maintenance Health Maintenance Due  Topic Date Due   Diabetic kidney evaluation - Urine ACR  11/09/2014   DTaP/Tdap/Td (2 - Td or Tdap) 07/22/2019   FOOT EXAM  09/05/2022   Mammogram- declined per patient. Plans to schedule at a later date.   Lung Cancer Screening: (Low Dose CT Chest recommended if Age 51-80 years, 30 pack-year currently smoking OR have quit w/in 15years.) does not qualify.   Hepatitis C Screening: Completed 2017.  Vision Screening: Recommended annual ophthalmology exams for early detection of glaucoma and other disorders of the eye.  Dental Screening: Recommended annual dental exams for proper oral hygiene.  Community Resource Referral / Chronic Care Management: CRR required this visit?  No   CCM required this visit?  No      Plan:     I have personally reviewed and noted the following in the patient's chart:   Medical and social history Use of alcohol, tobacco or illicit drugs  Current medications and supplements including opioid prescriptions. Patient is not currently taking opioid prescriptions. Functional ability and status Nutritional status Physical activity Advanced directives List of other physicians Hospitalizations, surgeries, and ER visits in previous 12 months Vitals Screenings to include cognitive, depression, and falls Referrals and appointments  In addition, I have reviewed and discussed with patient certain preventive protocols, quality metrics, and best practice recommendations. A written personalized care plan for preventive services as well as general preventive health recommendations were provided to patient.     Leta Jungling, LPN   [id number]

## 2022-09-08 NOTE — Patient Instructions (Addendum)
Kaitlin Hamilton , Thank you for taking time to come for your Medicare Wellness Visit. I appreciate your ongoing commitment to your health goals. Please review the following plan we discussed and let me know if I can assist you in the future.   These are the goals we discussed:  Goals Addressed               This Visit's Progress     Patient Stated     Complete health maintenance (pt-stated)        Schedule eye exam Schedule mammogram exam       DIET - INCREASE LEAN PROTEINS (pt-stated)        Add protein shake as a meal supplement          This is a list of the screening recommended for you and due dates:  Health Maintenance  Topic Date Due   Yearly kidney health urinalysis for diabetes  11/09/2014   DTaP/Tdap/Td vaccine (2 - Td or Tdap) 07/22/2019   Complete foot exam   09/05/2022   COVID-19 Vaccine (5 - 2023-24 season) 09/24/2022*   Mammogram  10/20/2022*   Eye exam for diabetics  10/20/2022*   Zoster (Shingles) Vaccine (1 of 2) 12/07/2022*   Hemoglobin A1C  12/05/2022   Yearly kidney function blood test for diabetes  06/07/2023   Medicare Annual Wellness Visit  09/09/2023   DEXA scan (bone density measurement)  12/24/2026   Pneumonia Vaccine  Completed   Flu Shot  Completed   Hepatitis C Screening: USPSTF Recommendation to screen - Ages 18-79 yo.  Completed   HPV Vaccine  Aged Out  *Topic was postponed. The date shown is not the original due date.   Conditions/risks identified: none new  Next appointment: Follow up in one year for your annual wellness visit    Preventive Care 65 Years and Older, Female Preventive care refers to lifestyle choices and visits with your health care provider that can promote health and wellness. What does preventive care include? A yearly physical exam. This is also called an annual well check. Dental exams once or twice a year. Routine eye exams. Ask your health care provider how often you should have your eyes checked. Personal  lifestyle choices, including: Daily care of your teeth and gums. Regular physical activity. Eating a healthy diet. Avoiding tobacco and drug use. Limiting alcohol use. Practicing safe sex. Taking low-dose aspirin every day. Taking vitamin and mineral supplements as recommended by your health care provider. What happens during an annual well check? The services and screenings done by your health care provider during your annual well check will depend on your age, overall health, lifestyle risk factors, and family history of disease. Counseling  Your health care provider may ask you questions about your: Alcohol use. Tobacco use. Drug use. Emotional well-being. Home and relationship well-being. Sexual activity. Eating habits. History of falls. Memory and ability to understand (cognition). Work and work Statistician. Reproductive health. Screening  You may have the following tests or measurements: Height, weight, and BMI. Blood pressure. Lipid and cholesterol levels. These may be checked every 5 years, or more frequently if you are over 42 years old. Skin check. Lung cancer screening. You may have this screening every year starting at age 66 if you have a 30-pack-year history of smoking and currently smoke or have quit within the past 15 years. Fecal occult blood test (FOBT) of the stool. You may have this test every year starting at age 7. Flexible  sigmoidoscopy or colonoscopy. You may have a sigmoidoscopy every 5 years or a colonoscopy every 10 years starting at age 65. Hepatitis C blood test. Hepatitis B blood test. Sexually transmitted disease (STD) testing. Diabetes screening. This is done by checking your blood sugar (glucose) after you have not eaten for a while (fasting). You may have this done every 1-3 years. Bone density scan. This is done to screen for osteoporosis. You may have this done starting at age 81. Mammogram. This may be done every 1-2 years. Talk to your  health care provider about how often you should have regular mammograms. Talk with your health care provider about your test results, treatment options, and if necessary, the need for more tests. Vaccines  Your health care provider may recommend certain vaccines, such as: Influenza vaccine. This is recommended every year. Tetanus, diphtheria, and acellular pertussis (Tdap, Td) vaccine. You may need a Td booster every 10 years. Zoster vaccine. You may need this after age 70. Pneumococcal 13-valent conjugate (PCV13) vaccine. One dose is recommended after age 68. Pneumococcal polysaccharide (PPSV23) vaccine. One dose is recommended after age 56. Talk to your health care provider about which screenings and vaccines you need and how often you need them. This information is not intended to replace advice given to you by your health care provider. Make sure you discuss any questions you have with your health care provider. Document Released: 08/03/2015 Document Revised: 03/26/2016 Document Reviewed: 05/08/2015 Elsevier Interactive Patient Education  2017 [place] Prevention in the Home Falls can cause injuries. They can happen to people of all ages. There are many things you can do to make your home safe and to help prevent falls. What can I do on the outside of my home? Regularly fix the edges of walkways and driveways and fix any cracks. Remove anything that might make you trip as you walk through a door, such as a raised step or threshold. Trim any bushes or trees on the path to your home. Use bright outdoor lighting. Clear any walking paths of anything that might make someone trip, such as rocks or tools. Regularly check to see if handrails are loose or broken. Make sure that both sides of any steps have handrails. Any raised decks and porches should have guardrails on the edges. Have any leaves, snow, or ice cleared regularly. Use sand or salt on walking paths during winter. Clean  up any spills in your garage right away. This includes oil or grease spills. What can I do in the bathroom? Use night lights. Install grab bars by the toilet and in the tub and shower. Do not use towel bars as grab bars. Use non-skid mats or decals in the tub or shower. If you need to sit down in the shower, use a plastic, non-slip stool. Keep the floor dry. Clean up any water that spills on the floor as soon as it happens. Remove soap buildup in the tub or shower regularly. Attach bath mats securely with double-sided non-slip rug tape. Do not have throw rugs and other things on the floor that can make you trip. What can I do in the bedroom? Use night lights. Make sure that you have a light by your bed that is easy to reach. Do not use any sheets or blankets that are too big for your bed. They should not hang down onto the floor. Have a firm chair that has side arms. You can use this for support while you get dressed.  Do not have throw rugs and other things on the floor that can make you trip. What can I do in the kitchen? Clean up any spills right away. Avoid walking on wet floors. Keep items that you use a lot in easy-to-reach places. If you need to reach something above you, use a strong step stool that has a grab bar. Keep electrical cords out of the way. Do not use floor polish or wax that makes floors slippery. If you must use wax, use non-skid floor wax. Do not have throw rugs and other things on the floor that can make you trip. What can I do with my stairs? Do not leave any items on the stairs. Make sure that there are handrails on both sides of the stairs and use them. Fix handrails that are broken or loose. Make sure that handrails are as long as the stairways. Check any carpeting to make sure that it is firmly attached to the stairs. Fix any carpet that is loose or worn. Avoid having throw rugs at the top or bottom of the stairs. If you do have throw rugs, attach them to the  floor with carpet tape. Make sure that you have a light switch at the top of the stairs and the bottom of the stairs. If you do not have them, ask someone to add them for you. What else can I do to help prevent falls? Wear shoes that: Do not have high heels. Have rubber bottoms. Are comfortable and fit you well. Are closed at the toe. Do not wear sandals. If you use a stepladder: Make sure that it is fully opened. Do not climb a closed stepladder. Make sure that both sides of the stepladder are locked into place. Ask someone to hold it for you, if possible. Clearly mark and make sure that you can see: Any grab bars or handrails. First and last steps. Where the edge of each step is. Use tools that help you move around (mobility aids) if they are needed. These include: Canes. Walkers. Scooters. Crutches. Turn on the lights when you go into a dark area. Replace any light bulbs as soon as they burn out. Set up your furniture so you have a clear path. Avoid moving your furniture around. If any of your floors are uneven, fix them. If there are any pets around you, be aware of where they are. Review your medicines with your doctor. Some medicines can make you feel dizzy. This can increase your chance of falling. Ask your doctor what other things that you can do to help prevent falls. This information is not intended to replace advice given to you by your health care provider. Make sure you discuss any questions you have with your health care provider. Document Released: 05/03/2009 Document Revised: 12/13/2015 Document Reviewed: 08/11/2014 Elsevier Interactive Patient Education  2017 [employer].

## 2022-09-09 ENCOUNTER — Ambulatory Visit (INDEPENDENT_AMBULATORY_CARE_PROVIDER_SITE_OTHER): Payer: Medicare Other | Admitting: Family Medicine

## 2022-09-09 ENCOUNTER — Encounter: Payer: Self-pay | Admitting: Family Medicine

## 2022-09-09 VITALS — BP 124/80 | HR 90 | Temp 97.8°F | Ht 65.5 in | Wt 204.0 lb

## 2022-09-09 DIAGNOSIS — J84112 Idiopathic pulmonary fibrosis: Secondary | ICD-10-CM | POA: Diagnosis not present

## 2022-09-09 DIAGNOSIS — I5032 Chronic diastolic (congestive) heart failure: Secondary | ICD-10-CM

## 2022-09-09 DIAGNOSIS — E118 Type 2 diabetes mellitus with unspecified complications: Secondary | ICD-10-CM | POA: Diagnosis not present

## 2022-09-09 DIAGNOSIS — B372 Candidiasis of skin and nail: Secondary | ICD-10-CM

## 2022-09-09 DIAGNOSIS — E1159 Type 2 diabetes mellitus with other circulatory complications: Secondary | ICD-10-CM

## 2022-09-09 DIAGNOSIS — I152 Hypertension secondary to endocrine disorders: Secondary | ICD-10-CM | POA: Diagnosis not present

## 2022-09-09 LAB — POCT GLYCOSYLATED HEMOGLOBIN (HGB A1C): Hemoglobin A1C: 6.2 % — AB (ref 4.0–5.6)

## 2022-09-09 MED ORDER — KETOCONAZOLE 2 % EX CREA: 1.0000 | TOPICAL_CREAM | Freq: Every day | CUTANEOUS | 1 refills | Status: DC

## 2022-09-09 NOTE — Assessment & Plan Note (Signed)
Stable, chronic.  Continue current medication.   Well controlled on losartan 50 mg p.o. daily, HCTZ 25 mg daily and verapamil CR 180 mg p.o. daily

## 2022-09-09 NOTE — Progress Notes (Signed)
Patient ID: Kaitlin Hamilton, female    DOB: July 28, 1950, 72 y.o.   MRN: [id number]  This visit was conducted in person.  BP 124/80   Pulse 90   Temp 97.8 F (36.6 C) (Temporal)   Ht 5' 5.5" (1.664 m)   Wt 204 lb (92.5 kg)   SpO2 94% Comment: 8 L O2  BMI 33.43 kg/m    CC:  Chief Complaint  Patient presents with   Diabetes    Subjective:   HPI: Kaitlin Hamilton is a 72 y.o. female presenting on 09/09/2022 for Diabetes  Reviewed recent office visit note from Dr. Vaughan Browner pulmonology February 9 for interstitial lung disease. ON prednisone 5 mg daily.  Diabetes:   SE to semaglutide injections... changed to trial of rybelsus oral semaglutide.. had significant nausea on 7 mg  dose ( had tolerated 3 mg)  On metformin 500 mg XR 4 tablets daily, glipizide XL 10 mg daily   She has not been eating as much lately given nausea an decreased appetite... using zofran as needed. Still having some loose stool.  Actos contraindicated given diastolic HF. Lab Results  Component Value Date   HGBA1C 6.2 (A) 09/09/2022  Using medications without difficulties: Hypoglycemic episodes: Hyperglycemic episodes: Feet problems: Blood Sugars averaging: eye exam within last year:   She has had resolution of vaginal soreness and iritation resolved.  Using topical treatment for candida as needed.   Wt Readings from Last 3 Encounters:  09/09/22 204 lb (92.5 kg)  09/08/22 202 lb 4.8 oz (91.8 kg)  08/29/22 205 lb 12.8 oz (93.4 kg)    Using belsomra  helps some with sleep at night.  Using  alprazolam as needed for anxiety when breathing getting bad, worsening anxiety.     Relevant past medical, surgical, family and social history reviewed and updated as indicated. Interim medical history since our last visit reviewed. Allergies and medications reviewed and updated. Outpatient Medications Prior to Visit  Medication Sig Dispense Refill   acetaminophen (TYLENOL) 500 MG tablet Take 1,000 mg by mouth every 6  (six) hours as needed for moderate pain.     ALPRAZolam (XANAX) 0.25 MG tablet Take 1 tablet (0.25 mg total) by mouth 2 (two) times daily as needed for anxiety. 30 tablet 0   Ascorbic Acid (VITAMIN C PO) Take by mouth.     baclofen (LIORESAL) 10 MG tablet Take 1 tablet (10 mg total) by mouth 3 (three) times daily as needed. 30 tablet 0   FIBER PO Take 1 tablet by mouth daily.     fluticasone (FLONASE) 50 MCG/ACT nasal spray Place 1 spray into both nostrils daily.     furosemide (LASIX) 20 MG tablet Take 1 tablet (20 mg total) by mouth daily. Take one by mouth daily. 30 tablet 0   glipiZIDE (GLUCOTROL XL) 10 MG 24 hr tablet Take 1 tablet (10 mg total) by mouth daily with breakfast. 30 tablet 11   glucose blood (ONETOUCH ULTRA) test strip CHECK BLOOD SUGAR DAILY. 50 strip 5   hydrochlorothiazide (HYDRODIURIL) 12.5 MG tablet TAKE 1 TABLET DAILY ALONG WITH ONE OF THE LOSARTAN 50 MG TABLETS 90 tablet 1   levothyroxine (SYNTHROID) 125 MCG tablet TAKE 1 TABLET BY MOUTH DAILY  BEFORE BREAKFAST 100 tablet 0   losartan (COZAAR) 50 MG tablet TAKE 1 TABLET DAILY ALONG WITH 1 OF THE HCTZ 12.5MG TABLETS 90 tablet 1   metFORMIN (GLUCOPHAGE-XR) 500 MG 24 hr tablet Take 4 tablets (2,000 mg total) by  mouth daily with breakfast. 360 tablet 1   montelukast (SINGULAIR) 10 MG tablet Take 1 tablet (10 mg total) by mouth at bedtime. 90 tablet 0   mycophenolate (CELLCEPT) 250 MG capsule Take 1559m twice daily 360 capsule 0   mycophenolate (CELLCEPT) 500 MG tablet Take 15011m3 TABS) twice a day 180 tablet 2   NON FORMULARY Place 6-8 L into the nose daily. 10 Liters with exertion     nystatin (MYCOSTATIN/NYSTOP) powder Apply 1 Application topically 3 (three) times daily. 60 g 0   ondansetron (ZOFRAN) 4 MG tablet Take 1 tablet (4 mg total) by mouth every 8 (eight) hours as needed for nausea or vomiting. 20 tablet 0   OneTouch Delica Lancets 3399991111ISC CHECK BLOOD SUGAR ONCE DAILY. 100 each 3   pantoprazole (PROTONIX) 40 MG  tablet TAKE 1 TABLET BY MOUTH TWICE  DAILY 200 tablet 2   Pirfenidone 801 MG TABS TAKE 1 TABLET (801MG) BY MOUTH  THREE TIMES DAILY WITH FOOD 270 tablet 0   predniSONE (DELTASONE) 5 MG tablet Take 1 tablet (5 mg total) by mouth daily with breakfast. 30 tablet 1   Probiotic Product (PROBIOTIC DAILY PO) Take by mouth.     rosuvastatin (CRESTOR) 10 MG tablet Take 1 tablet (10 mg total) by mouth daily. 90 tablet 3   Suvorexant (BELSOMRA) 5 MG TABS Take 1 tablet (5 mg total) by mouth at bedtime as needed. 30 tablet 0   verapamil (CALAN-SR) 180 MG CR tablet TAKE 1 TABLET BY MOUTH DAILY 100 tablet 2   ketoconazole (NIZORAL) 2 % cream Apply 1 Application topically daily. 15 g 1   nystatin ointment (MYCOSTATIN) Apply 1 Application topically 2 (two) times daily after a meal.     metroNIDAZOLE (METROGEL) 0.75 % vaginal gel Place 1 Applicatorful vaginally at bedtime. 70 g 0   Semaglutide (RYBELSUS) 7 MG TABS Take 7 mg by mouth daily before breakfast. Take 30 min before breakfast with 4oz of water (Patient not taking: Reported on 08/29/2022)     No facility-administered medications prior to visit.     Per HPI unless specifically indicated in ROS section below Review of Systems Objective:  BP 124/80   Pulse 90   Temp 97.8 F (36.6 C) (Temporal)   Ht 5' 5.5" (1.664 m)   Wt 204 lb (92.5 kg)   SpO2 94% Comment: 8 L O2  BMI 33.43 kg/m   Wt Readings from Last 3 Encounters:  09/09/22 204 lb (92.5 kg)  09/08/22 202 lb 4.8 oz (91.8 kg)  08/29/22 205 lb 12.8 oz (93.4 kg)      Physical Exam Constitutional:      General: She is not in acute distress.    Appearance: Normal appearance. She is well-developed. She is not ill-appearing or toxic-appearing.  HENT:     Head: Normocephalic.     Right Ear: Hearing, tympanic membrane, ear canal and external ear normal. Tympanic membrane is not erythematous, retracted or bulging.     Left Ear: Hearing, tympanic membrane, ear canal and external ear normal. Tympanic  membrane is not erythematous, retracted or bulging.     Nose: No mucosal edema or rhinorrhea.     Right Sinus: No maxillary sinus tenderness or frontal sinus tenderness.     Left Sinus: No maxillary sinus tenderness or frontal sinus tenderness.     Mouth/Throat:     Pharynx: Uvula midline.  Eyes:     General: Lids are normal. Lids are everted, no foreign bodies appreciated.  Conjunctiva/sclera: Conjunctivae normal.     Pupils: Pupils are equal, round, and reactive to light.  Neck:     Thyroid: No thyroid mass or thyromegaly.     Vascular: No carotid bruit.     Trachea: Trachea normal.  Cardiovascular:     Rate and Rhythm: Normal rate and regular rhythm.     Pulses: Normal pulses.     Heart sounds: Normal heart sounds, S1 normal and S2 normal. No murmur heard.    No friction rub. No gallop.  Pulmonary:     Effort: Pulmonary effort is normal. No tachypnea or respiratory distress.     Breath sounds: Decreased breath sounds and rhonchi present. No wheezing or rales.  Abdominal:     General: Bowel sounds are normal.     Palpations: Abdomen is soft.     Tenderness: There is no abdominal tenderness.  Musculoskeletal:     Cervical back: Normal range of motion and neck supple.  Skin:    General: Skin is warm and dry.     Findings: No rash.  Neurological:     Mental Status: She is alert.  Psychiatric:        Mood and Affect: Mood is not anxious or depressed.        Speech: Speech normal.        Behavior: Behavior normal. Behavior is cooperative.        Thought Content: Thought content normal.        Judgment: Judgment normal.       Results for orders placed or performed in visit on 09/09/22  POCT glycosylated hemoglobin (Hb A1C)  Result Value Ref Range   Hemoglobin A1C 6.2 (A) 4.0 - 5.6 %   HbA1c POC (<> result, manual entry)     HbA1c, POC (prediabetic range)     HbA1c, POC (controlled diabetic range)      Assessment and Plan  Controlled type 2 diabetes mellitus with  complication, without long-term current use of insulin (HCC) Assessment & Plan: Chronic, improved control with continued weight loss.  Unclear as to why she is having continued nausea and diarrhea, patient feels it could be due to anxiety.. If A1c remains low we can consider decreasing metformin as this could be giving her side effects.  Intolerant of both oral and injectable semaglutide.   Orders: -     POCT glycosylated hemoglobin (Hb A1C)  Hypertension associated with diabetes ([place]) Assessment & Plan: Stable, chronic.  Continue current medication.   Well controlled on losartan 50 mg p.o. daily, HCTZ 25 mg daily and verapamil CR 180 mg p.o. daily   Chronic diastolic heart failure (HCC) Assessment & Plan: Chronic, now off Actos Euvolemic in office today on furosemide 20 mg p.o. daily   UIP (usual interstitial pneumonitis) (HCC) Assessment & Plan: Chronic, followed by pulmonary, progressive     Candidal intertrigo Assessment & Plan: Chronic intermittent Tolerable control using ketoconazole or nystatin powder as needed   Other orders -     Ketoconazole; Apply 1 Application topically daily.  Dispense: 15 g; Refill: 1    Return in about 6 months (around 03/10/2023) for diabetes follow up with POC A1C.   [name], MD

## 2022-09-09 NOTE — Assessment & Plan Note (Signed)
Chronic, followed by pulmonary, progressive

## 2022-09-09 NOTE — Assessment & Plan Note (Signed)
Chronic intermittent Tolerable control using ketoconazole or nystatin powder as needed

## 2022-09-09 NOTE — Assessment & Plan Note (Signed)
Chronic, improved control with continued weight loss.  Unclear as to why she is having continued nausea and diarrhea, patient feels it could be due to anxiety.. If A1c remains low we can consider decreasing metformin as this could be giving her side effects.  Intolerant of both oral and injectable semaglutide.

## 2022-09-09 NOTE — Assessment & Plan Note (Signed)
Chronic, now off Actos Euvolemic in office today on furosemide 20 mg p.o. daily

## 2022-09-11 ENCOUNTER — Ambulatory Visit: Payer: Medicare Other

## 2022-09-16 ENCOUNTER — Ambulatory Visit (INDEPENDENT_AMBULATORY_CARE_PROVIDER_SITE_OTHER): Payer: Medicare Other

## 2022-09-16 VITALS — BP 113/68 | HR 93 | Temp 97.8°F | Resp 16 | Ht 66.0 in | Wt 206.4 lb

## 2022-09-16 DIAGNOSIS — J84112 Idiopathic pulmonary fibrosis: Secondary | ICD-10-CM | POA: Diagnosis not present

## 2022-09-16 DIAGNOSIS — J849 Interstitial pulmonary disease, unspecified: Secondary | ICD-10-CM | POA: Diagnosis not present

## 2022-09-16 MED ORDER — DIPHENHYDRAMINE HCL 25 MG PO CAPS
50.0000 mg | ORAL_CAPSULE | Freq: Once | ORAL | Status: AC
  Administered 2022-09-16: 50 mg via ORAL
  Filled 2022-09-16: qty 2

## 2022-09-16 MED ORDER — ACETAMINOPHEN 325 MG PO TABS
650.0000 mg | ORAL_TABLET | Freq: Once | ORAL | Status: AC
  Administered 2022-09-16: 650 mg via ORAL
  Filled 2022-09-16: qty 2

## 2022-09-16 MED ORDER — SODIUM CHLORIDE 0.9 % IV SOLN
1000.0000 mg | Freq: Once | INTRAVENOUS | Status: AC
  Administered 2022-09-16: 1000 mg via INTRAVENOUS
  Filled 2022-09-16: qty 100

## 2022-09-16 MED ORDER — METHYLPREDNISOLONE SODIUM SUCC 125 MG IJ SOLR
INTRAMUSCULAR | Status: AC
  Filled 2022-09-16: qty 2

## 2022-09-16 MED ORDER — METHYLPREDNISOLONE SODIUM SUCC 125 MG IJ SOLR
125.0000 mg | Freq: Once | INTRAMUSCULAR | Status: AC
  Administered 2022-09-16: 125 mg via INTRAVENOUS
  Filled 2022-09-16: qty 2

## 2022-09-16 NOTE — Progress Notes (Signed)
Diagnosis: ILD  Provider:  [name] MD  Procedure: Infusion  IV Type: Peripheral, IV Location: R Forearm  Ruxience (rituximab-pvvr), Dose: 1000 mg  Infusion Start Time: [id number]  Infusion Stop Time: [id number]  Post Infusion IV Care: Observation period completed and Peripheral IV Discontinued  Discharge: Condition: Good, Destination: Home . AVS Provided  Performed by:  Adelina Mings, LPN

## 2022-09-17 ENCOUNTER — Other Ambulatory Visit: Payer: Self-pay | Admitting: Pulmonary Disease

## 2022-09-22 ENCOUNTER — Other Ambulatory Visit: Payer: Self-pay | Admitting: Pulmonary Disease

## 2022-09-22 ENCOUNTER — Encounter: Payer: Self-pay | Admitting: Pulmonary Disease

## 2022-09-22 ENCOUNTER — Telehealth: Payer: Self-pay

## 2022-09-22 DIAGNOSIS — J849 Interstitial pulmonary disease, unspecified: Secondary | ICD-10-CM

## 2022-09-22 NOTE — Telephone Encounter (Signed)
Dr. Vaughan Browner, please see mychart messages sent and advise.

## 2022-09-22 NOTE — Progress Notes (Signed)
Care Management & Coordination Services Pharmacy Team  Reason for Encounter: Appointment Reminder  Contacted patient to confirm telephone appointment with Charlene Brooke, PharmD on 09/25/2022 at 3:45.  Unsuccessful outreach. Left voicemail for patient to return call.  Star Rating Drugs:  Medication:                Last Fill:         Day Supply Glipizide 10 mg           07/08/2022      30 Losartan 50 mg           05/30/2022      90 Metformin 500 mg       06/06/2022      90         Rosuvastatin 10 mg    04/24/2022      90 Ozempic 0.25 mg        06/30/2022      28   Care Gaps: Annual wellness visit in last year? Yes 09/05/21    If Diabetic: Last eye exam / retinopathy screening: 04/17/2020  Last diabetic foot exam: Up to date   Charlene Brooke, CPP notified   Marijean Niemann, [place] Pharmacy Assistant [phone]

## 2022-09-23 ENCOUNTER — Other Ambulatory Visit: Payer: Self-pay | Admitting: Pharmacy Technician

## 2022-09-23 ENCOUNTER — Telehealth: Payer: Self-pay | Admitting: Family Medicine

## 2022-09-23 ENCOUNTER — Other Ambulatory Visit: Payer: Medicare Other

## 2022-09-23 DIAGNOSIS — Z515 Encounter for palliative care: Secondary | ICD-10-CM

## 2022-09-23 MED ORDER — AZITHROMYCIN 250 MG PO TABS: ORAL_TABLET | ORAL | 0 refills | Status: DC

## 2022-09-23 NOTE — Telephone Encounter (Signed)
Patient calling to cancel pharmacy phone call on 3.7.24, patient is very sick and it is hard for her to talk  Please contact the patient to reschedule for a later time

## 2022-09-23 NOTE — Telephone Encounter (Signed)
PT daughter calling. Wanted to add to [facility].  This AM she has a fever and more chest congestion and productive green, phlegm.  Back in the fall we did a antibx course. Likely viral but perhaps a RX to prevent it from getting worse could be recommended.    Daughter would like CB from Dr. Jaymes Graff his nurse @ [phone]

## 2022-09-23 NOTE — Telephone Encounter (Signed)
I called and discussed with patient.  I have strongly advised that she go to the emergency room to get admitted for IV antibiotics and steroids but daughter feels that she would not want go to the hospital We had multiple palliative care discussions in the past and they are considering DNR.  Daughter would like a call from palliative care to discuss further  For now I will cancel the next dose of Rituxan and send in a prescription for Z-Pak. Referral to palliative care placed

## 2022-09-23 NOTE — Progress Notes (Signed)
TELEPHONE ENCOUNTER  Telephone contact with patient to discuss palliative care referral. Patient requested SW outreach her daughter, Donella Stade.  Daughter shared that patient has interstitial lung disease which is effecting her overall health. D/t disease patients functional capabilities have declined gradually. Patient currently has a caregiver to assist with bathing and meal preparation. Patient is on 9LPM with oximizer. Patient Is not able to ambulate w/o O2 stats dropping into he 50's.  Patient recently had a Ruxience (rituximab-pvvr) infusion, per daughter patient has declined since then and has not scheduled for next infusion.  Hospice vs palliative care discussed in detail. Daughter states that she does not feel patient would be open to hospice services at this time as she has made the comment "she doesn't want to die" and daughter wants to support patient wishes.  Palliative care visit on hold at this time as daughter wishes to see, assess, and speak with patient over the weekend about palliative care and obtain feedback about she's feeling after the infusions. PC SW to follow up with daughter mon 09/29/22.

## 2022-09-25 ENCOUNTER — Encounter: Payer: Medicare Other | Admitting: Pharmacist

## 2022-09-25 ENCOUNTER — Other Ambulatory Visit: Payer: Self-pay | Admitting: Pulmonary Disease

## 2022-09-25 DIAGNOSIS — J849 Interstitial pulmonary disease, unspecified: Secondary | ICD-10-CM | POA: Diagnosis not present

## 2022-09-26 NOTE — Telephone Encounter (Cosign Needed)
Called patient; no answer; left message.  Lindsey Foltanski, PharmD notified  Linden Mikes, RMA Clinical Pharmacy Assistant [phone]   

## 2022-09-29 ENCOUNTER — Telehealth: Payer: Self-pay | Admitting: Pulmonary Disease

## 2022-09-29 NOTE — Telephone Encounter (Signed)
I have already sent in a prescription for Z-Pak. If she is not feeling well then I can send in a prescription for Augmentin and prednisone '40mg'$ /day for 5 days.  If she is feeling so weak then I would recommend that she get admitted to the hospital as there is not much else we can do over telephone. She may need IV therapy.

## 2022-09-29 NOTE — Telephone Encounter (Signed)
ATC daughter x 2 [place] I ATC pt and LVM with Dr.Mannam's suggestions. Informed her to call back tomorrow morning or call 911 if she worsens overnight.

## 2022-09-29 NOTE — Telephone Encounter (Signed)
Called and spoke to pt daughter Kaitlin Hamilton, who states pt is still having a productive cough with green thick mucus as well as nausea,  vomiting and low O2 stats. Has stated that the pt had a low fever which subsided on Thursday of last week. Pt has taken the prednisone that was prescribed on 3/4. The pt was offered a AV with a app but was declined, Kaitlin Hamilton states that the pt hasn't had much energy to even get out the bed. We also discussed if the patient has been tested for Flu or covid, which she was not. Dr. Vaughan Browner please advise.

## 2022-09-29 NOTE — Telephone Encounter (Signed)
ATC pt daughter Kaitlin Hamilton, she answered but the call was dropped. ATC back LVM for her to call me back @ desk phone

## 2022-09-29 NOTE — Telephone Encounter (Signed)
[name] daughter states patient still having symptoms of cough and mucus. Pharmacy is [facility]. [facility] phone number is [phone] and [phone].

## 2022-09-30 ENCOUNTER — Telehealth: Payer: Self-pay

## 2022-09-30 ENCOUNTER — Other Ambulatory Visit: Payer: Self-pay | Admitting: Pulmonary Disease

## 2022-09-30 ENCOUNTER — Ambulatory Visit: Payer: Medicare Other

## 2022-09-30 NOTE — Telephone Encounter (Signed)
Palliative care outreach to patients daughter, Donella Stade, to follow up on whether or not they would like to move forward with palliative care services.   Daughter shared that they would and requested to outreach SW tomorrow once she is back in town to schedule a visit.

## 2022-09-30 NOTE — Telephone Encounter (Signed)
We can try levaquin 750 mg daily for 7 days

## 2022-09-30 NOTE — Telephone Encounter (Signed)
Spoke with pt's daughter [name] (per DPR) who stated pt had completed z-pak which did help but pt is still have a lot of congestion. [name] stated pt did not want to use Augmentin r/t stomach upset and asked if any other antibiotic could be sent in. Prednisone order was not placed cause daughter wanted to wait on antibiotic change if one was offered. Dr. Vaughan Browner please advise.

## 2022-09-30 NOTE — Telephone Encounter (Signed)
Last prescription 09/18/22, #30 with no refills.

## 2022-09-30 NOTE — Telephone Encounter (Signed)
ATC LVMTCB x 1  

## 2022-10-01 ENCOUNTER — Other Ambulatory Visit: Payer: Self-pay

## 2022-10-01 ENCOUNTER — Inpatient Hospital Stay (HOSPITAL_COMMUNITY)
Admission: EM | Admit: 2022-10-01 | Discharge: 2022-10-11 | DRG: 189 | Disposition: A | Discharge status: Hospice | Payer: Medicare Other | Attending: Internal Medicine | Admitting: Internal Medicine

## 2022-10-01 ENCOUNTER — Emergency Department (HOSPITAL_COMMUNITY): Payer: Medicare Other

## 2022-10-01 ENCOUNTER — Telehealth: Payer: Self-pay | Admitting: Pulmonary Disease

## 2022-10-01 DIAGNOSIS — Z79624 Long term (current) use of inhibitors of nucleotide synthesis: Secondary | ICD-10-CM

## 2022-10-01 DIAGNOSIS — Z9981 Dependence on supplemental oxygen: Secondary | ICD-10-CM

## 2022-10-01 DIAGNOSIS — F419 Anxiety disorder, unspecified: Secondary | ICD-10-CM | POA: Diagnosis present

## 2022-10-01 DIAGNOSIS — Z888 Allergy status to other drugs, medicaments and biological substances status: Secondary | ICD-10-CM

## 2022-10-01 DIAGNOSIS — D638 Anemia in other chronic diseases classified elsewhere: Secondary | ICD-10-CM | POA: Diagnosis not present

## 2022-10-01 DIAGNOSIS — F411 Generalized anxiety disorder: Secondary | ICD-10-CM | POA: Diagnosis present

## 2022-10-01 DIAGNOSIS — J189 Pneumonia, unspecified organism: Principal | ICD-10-CM

## 2022-10-01 DIAGNOSIS — R9431 Abnormal electrocardiogram [ECG] [EKG]: Secondary | ICD-10-CM | POA: Diagnosis present

## 2022-10-01 DIAGNOSIS — Z743 Need for continuous supervision: Secondary | ICD-10-CM | POA: Diagnosis not present

## 2022-10-01 DIAGNOSIS — Z7401 Bed confinement status: Secondary | ICD-10-CM | POA: Diagnosis not present

## 2022-10-01 DIAGNOSIS — E876 Hypokalemia: Secondary | ICD-10-CM | POA: Diagnosis not present

## 2022-10-01 DIAGNOSIS — Z885 Allergy status to narcotic agent status: Secondary | ICD-10-CM

## 2022-10-01 DIAGNOSIS — Z9851 Tubal ligation status: Secondary | ICD-10-CM

## 2022-10-01 DIAGNOSIS — D649 Anemia, unspecified: Secondary | ICD-10-CM | POA: Diagnosis present

## 2022-10-01 DIAGNOSIS — K5790 Diverticulosis of intestine, part unspecified, without perforation or abscess without bleeding: Secondary | ICD-10-CM | POA: Diagnosis present

## 2022-10-01 DIAGNOSIS — E1165 Type 2 diabetes mellitus with hyperglycemia: Secondary | ICD-10-CM | POA: Diagnosis present

## 2022-10-01 DIAGNOSIS — E785 Hyperlipidemia, unspecified: Secondary | ICD-10-CM | POA: Diagnosis not present

## 2022-10-01 DIAGNOSIS — J309 Allergic rhinitis, unspecified: Secondary | ICD-10-CM | POA: Diagnosis not present

## 2022-10-01 DIAGNOSIS — Z7189 Other specified counseling: Secondary | ICD-10-CM | POA: Diagnosis not present

## 2022-10-01 DIAGNOSIS — J9621 Acute and chronic respiratory failure with hypoxia: Principal | ICD-10-CM | POA: Diagnosis present

## 2022-10-01 DIAGNOSIS — J84112 Idiopathic pulmonary fibrosis: Secondary | ICD-10-CM | POA: Diagnosis not present

## 2022-10-01 DIAGNOSIS — R059 Cough, unspecified: Secondary | ICD-10-CM | POA: Diagnosis not present

## 2022-10-01 DIAGNOSIS — G4700 Insomnia, unspecified: Secondary | ICD-10-CM | POA: Diagnosis present

## 2022-10-01 DIAGNOSIS — I4891 Unspecified atrial fibrillation: Secondary | ICD-10-CM | POA: Diagnosis present

## 2022-10-01 DIAGNOSIS — E871 Hypo-osmolality and hyponatremia: Secondary | ICD-10-CM | POA: Diagnosis present

## 2022-10-01 DIAGNOSIS — T502X5A Adverse effect of carbonic-anhydrase inhibitors, benzothiadiazides and other diuretics, initial encounter: Secondary | ICD-10-CM | POA: Diagnosis present

## 2022-10-01 DIAGNOSIS — I110 Hypertensive heart disease with heart failure: Secondary | ICD-10-CM | POA: Diagnosis present

## 2022-10-01 DIAGNOSIS — G4733 Obstructive sleep apnea (adult) (pediatric): Secondary | ICD-10-CM | POA: Diagnosis present

## 2022-10-01 DIAGNOSIS — Z515 Encounter for palliative care: Secondary | ICD-10-CM

## 2022-10-01 DIAGNOSIS — I5032 Chronic diastolic (congestive) heart failure: Secondary | ICD-10-CM | POA: Diagnosis present

## 2022-10-01 DIAGNOSIS — F32A Depression, unspecified: Secondary | ICD-10-CM | POA: Diagnosis not present

## 2022-10-01 DIAGNOSIS — I10 Essential (primary) hypertension: Secondary | ICD-10-CM | POA: Diagnosis present

## 2022-10-01 DIAGNOSIS — R0602 Shortness of breath: Secondary | ICD-10-CM | POA: Diagnosis not present

## 2022-10-01 DIAGNOSIS — Z880 Allergy status to penicillin: Secondary | ICD-10-CM

## 2022-10-01 DIAGNOSIS — Z91048 Other nonmedicinal substance allergy status: Secondary | ICD-10-CM

## 2022-10-01 DIAGNOSIS — Z8249 Family history of ischemic heart disease and other diseases of the circulatory system: Secondary | ICD-10-CM

## 2022-10-01 DIAGNOSIS — J8489 Other specified interstitial pulmonary diseases: Secondary | ICD-10-CM | POA: Diagnosis not present

## 2022-10-01 DIAGNOSIS — Z7952 Long term (current) use of systemic steroids: Secondary | ICD-10-CM

## 2022-10-01 DIAGNOSIS — R531 Weakness: Secondary | ICD-10-CM | POA: Diagnosis not present

## 2022-10-01 DIAGNOSIS — Z87442 Personal history of urinary calculi: Secondary | ICD-10-CM

## 2022-10-01 DIAGNOSIS — Z882 Allergy status to sulfonamides status: Secondary | ICD-10-CM

## 2022-10-01 DIAGNOSIS — Z8701 Personal history of pneumonia (recurrent): Secondary | ICD-10-CM

## 2022-10-01 DIAGNOSIS — K219 Gastro-esophageal reflux disease without esophagitis: Secondary | ICD-10-CM | POA: Diagnosis present

## 2022-10-01 DIAGNOSIS — R6889 Other general symptoms and signs: Secondary | ICD-10-CM | POA: Diagnosis not present

## 2022-10-01 DIAGNOSIS — T380X5A Adverse effect of glucocorticoids and synthetic analogues, initial encounter: Secondary | ICD-10-CM | POA: Diagnosis present

## 2022-10-01 DIAGNOSIS — E039 Hypothyroidism, unspecified: Secondary | ICD-10-CM | POA: Diagnosis not present

## 2022-10-01 DIAGNOSIS — Z79620 Long term (current) use of immunosuppressive biologic: Secondary | ICD-10-CM

## 2022-10-01 DIAGNOSIS — E874 Mixed disorder of acid-base balance: Secondary | ICD-10-CM | POA: Diagnosis not present

## 2022-10-01 DIAGNOSIS — J849 Interstitial pulmonary disease, unspecified: Secondary | ICD-10-CM | POA: Diagnosis not present

## 2022-10-01 DIAGNOSIS — I5033 Acute on chronic diastolic (congestive) heart failure: Secondary | ICD-10-CM | POA: Diagnosis not present

## 2022-10-01 DIAGNOSIS — Z66 Do not resuscitate: Secondary | ICD-10-CM | POA: Diagnosis not present

## 2022-10-01 DIAGNOSIS — E669 Obesity, unspecified: Secondary | ICD-10-CM | POA: Diagnosis present

## 2022-10-01 DIAGNOSIS — J479 Bronchiectasis, uncomplicated: Secondary | ICD-10-CM | POA: Diagnosis not present

## 2022-10-01 DIAGNOSIS — Z79890 Hormone replacement therapy (postmenopausal): Secondary | ICD-10-CM

## 2022-10-01 DIAGNOSIS — Z881 Allergy status to other antibiotic agents status: Secondary | ICD-10-CM

## 2022-10-01 DIAGNOSIS — R0902 Hypoxemia: Secondary | ICD-10-CM | POA: Diagnosis not present

## 2022-10-01 DIAGNOSIS — E119 Type 2 diabetes mellitus without complications: Secondary | ICD-10-CM

## 2022-10-01 DIAGNOSIS — Z79899 Other long term (current) drug therapy: Secondary | ICD-10-CM

## 2022-10-01 DIAGNOSIS — Z7984 Long term (current) use of oral hypoglycemic drugs: Secondary | ICD-10-CM

## 2022-10-01 LAB — LIPASE, BLOOD: Lipase: 25 U/L (ref 11–51)

## 2022-10-01 LAB — CBC WITH DIFFERENTIAL/PLATELET
Abs Immature Granulocytes: 0.09 10*3/uL — ABNORMAL HIGH (ref 0.00–0.07)
Basophils Absolute: 0 10*3/uL (ref 0.0–0.1)
Basophils Relative: 0 %
Eosinophils Absolute: 0.3 10*3/uL (ref 0.0–0.5)
Eosinophils Relative: 2 %
HCT: 32.7 % — ABNORMAL LOW (ref 36.0–46.0)
Hemoglobin: 9.9 g/dL — ABNORMAL LOW (ref 12.0–15.0)
Immature Granulocytes: 1 %
Lymphocytes Relative: 13 %
Lymphs Abs: 1.4 10*3/uL (ref 0.7–4.0)
MCH: 30.2 pg (ref 26.0–34.0)
MCHC: 30.3 g/dL (ref 30.0–36.0)
MCV: 99.7 fL (ref 80.0–100.0)
Monocytes Absolute: 0.7 10*3/uL (ref 0.1–1.0)
Monocytes Relative: 7 %
Neutro Abs: 8.1 10*3/uL — ABNORMAL HIGH (ref 1.7–7.7)
Neutrophils Relative %: 77 %
Platelets: 402 10*3/uL — ABNORMAL HIGH (ref 150–400)
RBC: 3.28 MIL/uL — ABNORMAL LOW (ref 3.87–5.11)
RDW: 12.8 % (ref 11.5–15.5)
WBC: 10.6 10*3/uL — ABNORMAL HIGH (ref 4.0–10.5)
nRBC: 0.2 % (ref 0.0–0.2)

## 2022-10-01 LAB — BASIC METABOLIC PANEL
Anion gap: 14 (ref 5–15)
BUN: 8 mg/dL (ref 8–23)
CO2: 39 mmol/L — ABNORMAL HIGH (ref 22–32)
Calcium: 9.8 mg/dL (ref 8.9–10.3)
Chloride: 85 mmol/L — ABNORMAL LOW (ref 98–111)
Creatinine, Ser: 0.52 mg/dL (ref 0.44–1.00)
GFR, Estimated: >60 mL/min (ref >60)
Glucose, Bld: 110 mg/dL — ABNORMAL HIGH (ref 70–99)
Potassium: 2.4 mmol/L — CL (ref 3.5–5.1)
Sodium: 138 mmol/L (ref 135–145)

## 2022-10-01 LAB — TROPONIN I (HIGH SENSITIVITY)
Troponin I (High Sensitivity): 7 ng/L (ref <18)
Troponin I (High Sensitivity): 7 ng/L (ref <18)

## 2022-10-01 LAB — LACTIC ACID, PLASMA: Lactic Acid, Venous: 1.2 mmol/L (ref 0.5–1.9)

## 2022-10-01 LAB — MAGNESIUM: Magnesium: 1.3 mg/dL — ABNORMAL LOW (ref 1.7–2.4)

## 2022-10-01 MED ORDER — LEVOFLOXACIN IN D5W 750 MG/150ML IV SOLN: 750.0000 mg | Freq: Once | INTRAVENOUS | Status: DC

## 2022-10-01 MED ORDER — METHYLPREDNISOLONE SODIUM SUCC 40 MG IJ SOLR
40.0000 mg | Freq: Once | INTRAMUSCULAR | Status: AC
  Administered 2022-10-01: 40 mg via INTRAVENOUS
  Filled 2022-10-01: qty 1

## 2022-10-01 MED ORDER — POTASSIUM CHLORIDE 10 MEQ/100ML IV SOLN
10.0000 meq | INTRAVENOUS | Status: AC
  Administered 2022-10-01 – 2022-10-02 (×4): 10 meq via INTRAVENOUS
  Filled 2022-10-01 (×4): qty 100

## 2022-10-01 MED ORDER — MAGNESIUM SULFATE 2 GM/50ML IV SOLN
2.0000 g | Freq: Once | INTRAVENOUS | Status: AC
  Administered 2022-10-01: 2 g via INTRAVENOUS
  Filled 2022-10-01: qty 50

## 2022-10-01 MED ORDER — SODIUM CHLORIDE 0.9 % IV SOLN
1.0000 g | Freq: Once | INTRAVENOUS | Status: AC
  Administered 2022-10-02: 1 g via INTRAVENOUS
  Filled 2022-10-01: qty 10

## 2022-10-01 NOTE — Telephone Encounter (Signed)
Called and spoke with patient's daughter, Kaitlin Hamilton (DPR), I advised her of recommendations per Dr. Vaughan Browner.  She stated that when she did not hear anything back, the last thing she was told was to take her to the emergency room.  She said her mom is currently in the back of an ambulance on her way to [facility].  I advised her that someone had tried to reach her yesterday and left her a vm.  She stated that a message was left on her mother's vm, but not on her vm and it was not a detailed vm.  She also stated that she had spend a long time on hold.  I apologized that she was on hold and we were unable to reach her until today.  I told her that I hoped her mom felt better soon.  Nothing further needed.

## 2022-10-01 NOTE — Telephone Encounter (Signed)
Dr.Mannam please advise on which Hospital you would  prefer for the pt

## 2022-10-01 NOTE — ED Provider Notes (Signed)
[facility] Provider Note   CSN: [id number] Arrival date & time: 10/01/22  1725     History Chief Complaint  Patient presents with   Cough    HPI Kaitlin Hamilton is a 72 y.o. female presenting for shortness of breath and cough.  She is a 72 year old female with an extensive medical history.  Follows with palliative care services as well as pulmonology for respiratory failure secondary to acute hypoxic respiratory failure. Known interstitial lung disease and pulmonary fibrosis.  On extensive medical therapies including Rituxan, chronic antibiosis, CellCept, chronic steroid therapy. On 9 L at baseline and has had had acute worsening for the last few days per family and patient.  Pulmonology was communicated with earlier today and recommended transfer to emergency department.   Patient's recorded medical, surgical, social, medication list and allergies were reviewed in the Snapshot window as part of the initial history.   Review of Systems   Review of Systems  Constitutional:  Negative for chills and fever.  HENT:  Negative for ear pain and sore throat.   Eyes:  Negative for pain and visual disturbance.  Respiratory:  Positive for cough and shortness of breath.   Cardiovascular:  Negative for chest pain and palpitations.  Gastrointestinal:  Negative for abdominal pain and vomiting.  Genitourinary:  Negative for dysuria and hematuria.  Musculoskeletal:  Negative for arthralgias and back pain.  Skin:  Negative for color change and rash.  Neurological:  Negative for seizures and syncope.  All other systems reviewed and are negative.   Physical Exam Updated Vital Signs BP 117/89   Pulse 82   Temp (!) 97.3 F (36.3 C) (Oral)   Resp (!) 25   Ht '5\' 6"'$  (1.676 m)   Wt 93.4 kg   SpO2 99%   BMI 33.25 kg/m  Physical Exam Vitals and nursing note reviewed.  Constitutional:      General: She is not in acute distress.    Appearance: She is  well-developed.  HENT:     Head: Normocephalic and atraumatic.  Eyes:     Conjunctiva/sclera: Conjunctivae normal.  Cardiovascular:     Rate and Rhythm: Normal rate and regular rhythm.     Heart sounds: No murmur heard. Pulmonary:     Effort: Respiratory distress present.     Breath sounds: Rhonchi present.  Abdominal:     General: There is no distension.     Palpations: Abdomen is soft.     Tenderness: There is no abdominal tenderness. There is no right CVA tenderness or left CVA tenderness.  Musculoskeletal:        General: No swelling or tenderness. Normal range of motion.     Cervical back: Neck supple.  Skin:    General: Skin is warm and dry.  Neurological:     General: No focal deficit present.     Mental Status: She is alert and oriented to person, place, and time. Mental status is at baseline.     Cranial Nerves: No cranial nerve deficit.      ED Course/ Medical Decision Making/ A&P    Procedures .Critical Care  Performed by: Tretha Sciara, MD Authorized by: Tretha Sciara, MD   Critical care provider statement:    Critical care time (minutes):  30   Critical care was necessary to treat or prevent imminent or life-threatening deterioration of the following conditions:  Respiratory failure (9L [place])   Critical care was time spent personally by me  on the following activities:  Development of treatment plan with patient or surrogate, discussions with consultants, evaluation of patient's response to treatment, examination of patient, ordering and review of laboratory studies, ordering and review of radiographic studies, ordering and performing treatments and interventions, pulse oximetry, re-evaluation of patient's condition and review of old [place] discussed with: admitting provider      Medications Ordered in ED Medications  potassium chloride 10 mEq in 100 mL IVPB (10 mEq Intravenous New Bag/Given 10/01/22 2245)  magnesium sulfate IVPB 2 g 50 mL (2 g  Intravenous New Bag/Given 10/01/22 2245)  cefTRIAXone (ROCEPHIN) 1 g in sodium chloride 0.9 % 100 mL IVPB (has no administration in time range)  methylPREDNISolone sodium succinate (SOLU-MEDROL) 40 mg/mL injection 40 mg (40 mg Intravenous Given 10/01/22 2239)    Medical Decision Making:    Kaitlin Hamilton is a 72 y.o. female who presented to the ED today with multiple complaints detailed above.     Patient's presentation is complicated by their history of multiple medical problems.  Patient placed on continuous vitals and telemetry monitoring while in ED which was reviewed periodically.   Complete initial physical exam performed, notably the patient  was in acute respiratory distress.      Reviewed and confirmed nursing documentation for past medical history, family history, social history.    Initial Assessment:   This tach is a critically ill 72 year old female at baseline secondary to her severe respiratory failure requiring extensive respiratory support.  Her 9 L nasal cannula is mitigating her symptoms, however given severity of symptoms, critical care was consulted for further recommendations.  Given her end-stage pulmonary fibrosis, there is no obvious mechanism by which to reverse patient's disease.  Mostly symptom management is the goals of care at this time.  Will treat with antibiotics, broadened from patient's azithromycin to IV ceftriaxone, will treat with IV steroids and arrange for admission.  Dr. Ruthann Cancer of critical care stated they would evaluate patient and place patient on pulmonary consult list as well. Observed in emergency department for 5 hours stable on her 9 L at baseline saturating well.  Remains dyspneic and floridly weak, she is likely require further advanced care and management.  Hospitalist team communicated with and agrees with need for admission.  Patient admitted with no further acute events. Disposition:   Based on the above findings, I believe this patient is  stable for admission.    Patient/family educated about specific findings on our evaluation and explained exact reasons for admission.  Patient/family educated about clinical situation and time was allowed to answer questions.   Admission team communicated with and agreed with need for admission. Patient admitted. Patient ready to move at this time.     Emergency Department Medication Summary:   Medications  potassium chloride 10 mEq in 100 mL IVPB (10 mEq Intravenous New Bag/Given 10/01/22 2245)  magnesium sulfate IVPB 2 g 50 mL (2 g Intravenous New Bag/Given 10/01/22 2245)  cefTRIAXone (ROCEPHIN) 1 g in sodium chloride 0.9 % 100 mL IVPB (has no administration in time range)  methylPREDNISolone sodium succinate (SOLU-MEDROL) 40 mg/mL injection 40 mg (40 mg Intravenous Given 10/01/22 2239)         Emergency Department Medication Summary:   Medications  potassium chloride 10 mEq in 100 mL IVPB (10 mEq Intravenous New Bag/Given 10/01/22 2245)  magnesium sulfate IVPB 2 g 50 mL (2 g Intravenous New Bag/Given 10/01/22 2245)  cefTRIAXone (ROCEPHIN) 1 g in sodium chloride 0.9 %  100 mL IVPB (has no administration in time range)  methylPREDNISolone sodium succinate (SOLU-MEDROL) 40 mg/mL injection 40 mg (40 mg Intravenous Given 10/01/22 2239)        Clinical Impression:  1. Community acquired pneumonia, unspecified laterality      Data Unavailable   Final Clinical Impression(s) / ED Diagnoses Final diagnoses:  Community acquired pneumonia, unspecified laterality    Rx / DC Orders ED Discharge Orders     None         Tretha Sciara, MD 10/01/22 2311

## 2022-10-01 NOTE — Telephone Encounter (Signed)
Kaitlin Hamilton would like to know what hospital to send patient to. Would like one that has pulmonary Doctors. Kaitlin Hamilton phone number is [phone].

## 2022-10-01 NOTE — H&P (Signed)
History and Physical    Kaitlin Hamilton [id number] DOB: [birth year] DOA: 10/01/2022  PCP: [name], MD  Patient coming from: Home  Chief Complaint: Shortness of breath  HPI: Kaitlin Hamilton is a 72 y.o. female with medical history significant of UIP fibrosis, chronic hypoxemic respiratory failure on 9 L O2, OSA on CPAP, type 2 diabetes, hypertension, chronic HFpEF, anxiety, depression, insomnia, GERD, hypothyroidism.  Last pulmonology office visit on 08/29/2022 for worsening dyspnea and fatigue concerning for worsening/progressive ILD.  PFTs showing worsening restriction and diffusion impairment.  No pulmonary hypertension on echocardiogram.  Patient is chronically on CellCept, pirfenidone, and prednisone 5 mg/day.  She was started on rituximab and received first infusion on 2/27.  Not on PJP prophylaxis due to allergies to sulfa drugs and atovaquone.  Pulmonology had recommended palliative care consult and considering DNR status.  Patient contacted pulmonology again on 3/5 for worsening respiratory symptoms and was prescribed Z-Pak and next dose of rituximab was canceled.  Pulmonology had again recommended palliative care consult/considering DNR status but patient was not ready for palliative care involvement at that time.  She completed Z-Pak but remained symptomatic and was subsequently prescribed Augmentin which she was not able to take due to GI issues.  Pulmonology had prescribed Levaquin yesterday.  Social worker followed up with the patient's daughter yesterday and they had agreed to move forward with palliative care services.  Patient presents to the ED today with shortness of breath and cough.  Afebrile, not tachycardic, and satting in the upper 90s on 9 L supplemental oxygen which is her baseline.  Labs significant for WBC 10.6, hemoglobin 9.9 (previously in the 11-12 range), MCV 99.7, platelet count 402k, potassium 2.4, magnesium 1.3, chloride 85, bicarb 39, lactic acid normal, troponin  negative x 2, blood cultures drawn.  Chest x-ray showing chronic ILD and no definite acute finding. ED physician consulted PCCM (Dr. Ruthann Cancer) who will see the patient in consultation.  Recommended antibiotics and IV steroids.  Patient received Solu-Medrol 40 mg, ceftriaxone, IV mag 2 g, and IV potassium 10 mg x 4 in the ED.  [facility] called to admit.  History provided by the patient and her daughter at bedside.  Patient reports history of ILD and progressively worsening dyspnea, cough, and fatigue.  Per daughter, up until summer of last year patient was requiring 6 L O2 at rest and 8 L with exertion but has continued to have increasing oxygen requirements since then and now on 9 L O2 at rest since November.  With even minimal exertion her oxygen saturation has been dropping to the 60s lately and she is requiring oxygen via facemask also.  Patient states she was having cough productive of dark green-colored sputum and recently finished 5-day course of Z-Pak after which her sputum appears less green.  She does report fever last week with temperature 100.8 F.  Daughter states pulmonology had prescribed Levaquin but they were not informed until today when they were already on their way to the emergency room, as such, she has not taken this medication yet.  She is chronically on CellCept, pirfenidone, and prednisone 5 mg/day.  She received rituximab infusion about 2 weeks ago after which her symptoms became much worse and subsequent treatments were canceled by her pulmonologist.  Daughter states she had spoken to Education officer, museum and they are currently working on setting up an appointment with palliative care.  Review of Systems:  Review of Systems  All other systems reviewed and are negative.  Past Medical History:  Diagnosis Date   Allergic rhinitis    uses Flonase daily   Anesthesia complication    Per pt/ past endoscopy in 2015, the anesthetic spray in back throat caused her to have breathing problems    Anxiety    Bronchitis    Chicken pox    Constipation    takes Fiber daily   Depression    Diverticulosis    Dysphagia    GERD (gastroesophageal reflux disease)    takes Protonix daily   H/O hiatal hernia    Hemorrhoids    History of bronchitis    2013   History of colon polyps    History of kidney stones    Hyperlipidemia    lost 43 pounds and no meds required at present   Hypertension    takes Verapamil and HCTZ daily   Hypokalemia    Hypothyroidism (acquired)    takes Synthroid daily   Insomnia    doesn't take any meds for this   Joint swelling    left thumb   Migraines    last one about a month ago   Non-insulin dependent type 2 diabetes mellitus ([place])    takes Glipizide daily   OA (osteoarthritis)    left knee   Oxygen deficiency    Oxygen dependent    Pt is on continuous O2 at 3 liters!   Pneumonia    last time in 2006   PONV (postoperative nausea and vomiting)    Shortness of breath    with exertion;takes Singulair daily as well as Flonase   Urinary frequency     Past Surgical History:  Procedure Laterality Date   ABDOMINAL EXPLORATION SURGERY     For Ovarian Cyst    APPENDECTOMY     CHOLECYSTECTOMY     COLONOSCOPY  05/2013   TA, severe diverticulosis, rpt 3 yrs ([place])   ENTEROSCOPY N/A 08/12/2013   Procedure: ENTEROSCOPY;  Surgeon: Lafayette Dragon, MD;  Location: [facility];  Service: Endoscopy;  Laterality: N/A;   ESOPHAGOGASTRODUODENOSCOPY     left knee arthroscopy     LITHOTRIPSY     x 2   LUNG BIOPSY Right 06/29/2013   Procedure: LUNG BIOPSY;  Surgeon: Melrose Nakayama, MD;  Location: [facility];  Service: Thoracic;  Laterality: Right;   TCS     TUBAL LIGATION     VIDEO ASSISTED THORACOSCOPY Right 06/29/2013   Procedure: VIDEO ASSISTED THORACOSCOPY;  Surgeon: Melrose Nakayama, MD;  Location: [facility];  Service: Thoracic;  Laterality: Right;     reports that she has never smoked. She has been exposed to tobacco smoke. She has never used  smokeless tobacco. She reports that she does not drink alcohol and does not use drugs.  Allergies  Allergen Reactions   Adhesive [Tape]     Redness, bruising with any adhesives- bandaids, patches, etc.    Atovaquone Itching   Codeine Hives and Swelling   Demerol [Meperidine] Hives and Swelling   Hydrocodone Nausea Only   Augmentin [Amoxicillin-Pot Clavulanate] Other (See Comments)    Upsets stomach   Azithromycin Nausea And Vomiting    Upset stomach   Trazodone And Nefazodone Itching   Sulfa Antibiotics Rash    Family History  Problem Relation Age of Onset   Rheum arthritis Mother    Rheum arthritis Sister    Autoimmune disease Sister    Prostate cancer Brother    Heart disease Brother    Uterine cancer Daughter  Heart disease Maternal Grandmother    Heart disease Maternal Grandfather    Colon cancer Neg Hx    Breast cancer Neg Hx     Prior to Admission medications   Medication Sig Start Date End Date Taking? Authorizing Provider  acetaminophen (TYLENOL) 500 MG tablet Take 1,000 mg by mouth every 6 (six) hours as needed for moderate pain.    [provider]  ALPRAZolam Duanne Moron) 0.25 MG tablet Take 1 tablet (0.25 mg total) by mouth 2 (two) times daily as needed for anxiety. 10/01/22   Mannam, Hart Robinsons, MD  Ascorbic Acid (VITAMIN C PO) Take by mouth.    [provider]  azithromycin (ZITHROMAX) 250 MG tablet Take 500 mg on day 1 and then 250 mg/day for the next 4 days 09/23/22   [name], MD  baclofen (LIORESAL) 10 MG tablet Take 1 tablet (10 mg total) by mouth 3 (three) times daily as needed. 07/22/22   Bedsole, Amy E, MD  FIBER PO Take 1 tablet by mouth daily.    [provider]  fluticasone (FLONASE) 50 MCG/ACT nasal spray Place 1 spray into both nostrils daily.    [provider]  furosemide (LASIX) 20 MG tablet Take 1 tablet (20 mg total) by mouth daily. Take one by mouth daily. 09/22/22   Mannam, Hart Robinsons, MD  glipiZIDE (GLUCOTROL XL)  10 MG 24 hr tablet Take 1 tablet (10 mg total) by mouth daily with breakfast. 05/16/22   Bedsole, Amy E, MD  glucose blood (ONETOUCH ULTRA) test strip CHECK BLOOD SUGAR DAILY. 06/09/22   Bedsole, Amy E, MD  hydrochlorothiazide (HYDRODIURIL) 12.5 MG tablet TAKE 1 TABLET DAILY ALONG WITH ONE OF THE LOSARTAN 50 MG TABLETS 05/22/22   Bedsole, Amy E, MD  ketoconazole (NIZORAL) 2 % cream Apply 1 Application topically daily. 09/09/22   Bedsole, Amy E, MD  levothyroxine (SYNTHROID) 125 MCG tablet TAKE 1 TABLET BY MOUTH DAILY  BEFORE BREAKFAST 08/15/22   Bedsole, Amy E, MD  losartan (COZAAR) 50 MG tablet TAKE 1 TABLET DAILY ALONG WITH 1 OF THE HCTZ 12.'5MG'$  TABLETS 08/25/22   Bedsole, Amy E, MD  metFORMIN (GLUCOPHAGE-XR) 500 MG 24 hr tablet Take 4 tablets (2,000 mg total) by mouth daily with breakfast. 06/06/22   Bedsole, Amy E, MD  montelukast (SINGULAIR) 10 MG tablet Take 1 tablet (10 mg total) by mouth at bedtime. 08/18/22   [name], MD  mycophenolate (CELLCEPT) 250 MG capsule Take '1500mg'$  twice daily 09/01/22   Mannam, Hart Robinsons, MD  mycophenolate (CELLCEPT) 500 MG tablet Take '1500mg'$ (3 TABS) twice a day 08/29/22   Mannam, Hart Robinsons, MD  NON FORMULARY Place 6-8 L into the nose daily. 10 Liters with exertion    [provider]  nystatin (MYCOSTATIN/NYSTOP) powder Apply 1 Application topically 3 (three) times daily. 07/15/22   Bedsole, Amy E, MD  ondansetron (ZOFRAN) 4 MG tablet Take 1 tablet (4 mg total) by mouth every 8 (eight) hours as needed for nausea or vomiting. 08/08/22   [name], MD  OneTouch Delica Lancets [id number] MISC CHECK BLOOD SUGAR ONCE DAILY. 11/11/19   Bedsole, Amy E, MD  pantoprazole (PROTONIX) 40 MG tablet TAKE 1 TABLET BY MOUTH TWICE  DAILY 08/15/22   Bedsole, Amy E, MD  Pirfenidone 801 MG TABS TAKE 1 TABLET ('801MG'$ ) BY MOUTH  THREE TIMES DAILY WITH FOOD 08/15/22   Mannam, Hart Robinsons, MD  predniSONE (DELTASONE) 5 MG tablet Take 1 tablet (5 mg total) by mouth daily with breakfast. 06/26/22  [name], MD  Probiotic Product (PROBIOTIC DAILY PO) Take by mouth.    [provider]  rosuvastatin (CRESTOR) 10 MG tablet Take 1 tablet (10 mg total) by mouth daily. 10/30/21   Bedsole, Amy E, MD  verapamil (CALAN-SR) 180 MG CR tablet TAKE 1 TABLET BY MOUTH DAILY 08/15/22   [name], MD    Physical Exam: Vitals:   10/01/22 1755 10/01/22 1800 10/01/22 1946 10/01/22 2130  BP:  (!) 114/55 (!) 120/51 117/89  Pulse:  89 82 82  Resp:  20 (!) 26 (!) 25  Temp: (!) 97.3 F (36.3 C)     TempSrc: Oral     SpO2:  98% 100% 99%  Weight:      Height:        Physical Exam Vitals reviewed.  Constitutional:      General: She is not in acute distress. HENT:     Head: Normocephalic and atraumatic.  Eyes:     Extraocular Movements: Extraocular movements intact.  Cardiovascular:     Rate and Rhythm: Normal rate and regular rhythm.     Pulses: Normal pulses.  Pulmonary:     Breath sounds: No stridor. No wheezing.     Comments: Crackles appreciated at the bases Coughing and tachypneic Abdominal:     General: Bowel sounds are normal. There is no distension.     Palpations: Abdomen is soft.     Tenderness: There is no abdominal tenderness.  Musculoskeletal:     Cervical back: Normal range of motion.     Right lower leg: No edema.     Left lower leg: No edema.  Skin:    General: Skin is warm and dry.  Neurological:     General: No focal deficit present.     Mental Status: She is alert and oriented to person, place, and time.     Labs on Admission: I have personally reviewed following labs and imaging studies  CBC: Recent Labs  Lab 10/01/22 1815  WBC 10.6*  NEUTROABS 8.1*  HGB 9.9*  HCT 32.7*  MCV 99.7  PLT [id number]*   Basic Metabolic Panel: Recent Labs  Lab 10/01/22 1815 10/01/22 2019  NA 138  --   K 2.4*  --   CL 85*  --   CO2 39*  --   GLUCOSE 110*  --   BUN 8  --   CREATININE 0.52  --   CALCIUM 9.8  --   MG  --  1.3*   GFR: Estimated Creatinine  Clearance: 74.2 mL/min (by C-G formula based on SCr of 0.52 mg/dL). Liver Function Tests: No results for input(s): "AST", "ALT", "ALKPHOS", "BILITOT", "PROT", "ALBUMIN" in the last 168 hours. Recent Labs  Lab 10/01/22 1815  LIPASE 25   No results for input(s): "AMMONIA" in the last 168 hours. Coagulation Profile: No results for input(s): "INR", "PROTIME" in the last 168 hours. Cardiac Enzymes: No results for input(s): "CKTOTAL", "CKMB", "CKMBINDEX", "TROPONINI" in the last 168 hours. BNP (last 3 results) Recent Labs    11/08/21 1251 02/14/22 1158 04/25/22 0956  PROBNP 49 52 51   HbA1C: No results for input(s): "HGBA1C" in the last 72 hours. CBG: No results for input(s): "GLUCAP" in the last 168 hours. Lipid Profile: No results for input(s): "CHOL", "HDL", "LDLCALC", "TRIG", "CHOLHDL", "LDLDIRECT" in the last 72 hours. Thyroid Function Tests: No results for input(s): "TSH", "T4TOTAL", "FREET4", "T3FREE", "THYROIDAB" in the last 72 hours. Anemia Panel: No results for input(s): "VITAMINB12", "FOLATE", "FERRITIN", "  TIBC", "IRON", "RETICCTPCT" in the last 72 hours. Urine analysis:    Component Value Date/Time   COLORURINE YELLOW 01/10/2021 1427   APPEARANCEUR CLEAR 01/10/2021 1427   LABSPEC 1.020 01/10/2021 1427   PHURINE 5.5 01/10/2021 1427   GLUCOSEU >=1000 (A) 01/10/2021 1427   HGBUR SMALL (A) 01/10/2021 1427   BILIRUBINUR negative 04/03/2022 1536   KETONESUR NEGATIVE 01/10/2021 1427   PROTEINUR Positive (A) 04/03/2022 1536   PROTEINUR 30 (A) 07/17/2019 1538   UROBILINOGEN 0.2 04/03/2022 1536   UROBILINOGEN 0.2 01/10/2021 1427   NITRITE negative 04/03/2022 1536   NITRITE NEGATIVE 01/10/2021 1427   LEUKOCYTESUR Negative 04/03/2022 1536   LEUKOCYTESUR NEGATIVE 01/10/2021 1427    Radiological Exams on Admission: DG Chest Portable 1 View  Result Date: 10/01/2022 CLINICAL DATA:  Shortness of breath EXAM: PORTABLE CHEST 1 VIEW COMPARISON:  05/29/2022, CT chest  02/07/2022 FINDINGS: Bilateral reticular and mild ground-glass opacity consistent with chronic lung disease. No definite acute superimposed airspace disease or effusion. Stable cardiomediastinal silhouette. No pneumothorax. Postsurgical changes in the right upper lung. IMPRESSION: Chronic interstitial lung disease. No definite acute superimposed process. Electronically Signed   By: Donavan Foil M.D.   On: 10/01/2022 18:46    EKG: Independently reviewed.  Sinus rhythm, PACs, QTc 522.  Assessment and Plan  Progressively worsening ILD/UIP fibrosis Acute on chronic hypoxemic respiratory failure Patient with known UIP fibrosis presenting with worsening dyspnea and cough.  Currently satting in the upper 90s on 9 L O2 which is her baseline but reportedly sats dropping to the 60s at home with even minimal exertion.  She is afebrile. No significant leukocytosis or lactic acidosis on labs.  Recently completed Z-Pak.  Chest x-ray showing chronic ILD and no definite acute finding.  Continue treatment with ceftriaxone, Solu-Medrol 40 mg every 12 hours, and Mucinex.  Continue CellCept and pirfenidone.  Last high-resolution CT chest was in July 2023, repeat imaging ordered.  Continuous pulse ox, continue supplemental oxygen to keep oxygen saturation above 92%.  Sputum Gram stain and culture.  COVID/influenza/RSV PCR.  Blood cultures pending.  Pulmonology consulted by ED physician.  I have placed palliative care consult.  I had a goals of care discussion with the patient and her daughter, patient wishes to be DNR/DNI.  Hypokalemia and hypomagnesemia Likely related to diuretic use.  Continue to monitor electrolytes and replace as needed.  Cardiac monitoring.  No signs of volume overload, hold diuretics at this time.  QT prolongation In the setting of hypokalemia and hypomagnesemia.  QTc 522.  Continue cardiac monitoring.  Replace electrolytes and continue to monitor labs.  Avoid QT prolonging drugs and repeat EKG in  AM.  Normocytic anemia Hemoglobin 9.9 (previously in the 11-12 range).  Patient is not endorsing any symptoms of GI bleed.  Anemia panel ordered and continue to monitor CBC.  OSA -Continue nightly CPAP  Non-insulin-dependent type 2 diabetes A1c 6.2 on 09/09/2022.  Sensitive sliding scale insulin ordered.  Hypertension Stable.  Continue losartan and verapamil.  Holding diuretics at this time given hypokalemia/hypomagnesemia.  Chronic HFpEF Echo done in October 2023 showing EF 60 to 123456, grade 1 diastolic dysfunction.  No signs of volume overload at this time.  Continue to monitor volume status closely and check BNP.  Anxiety Continue Xanax PRN.  GERD Continue Protonix.  Hypothyroidism Continue Synthroid and check TSH.  Hyperlipidemia Continue Crestor.  DVT prophylaxis: Lovenox Code Status: DNR/DNI (discussed with the patient and her daughter) Family Communication: Daughter at bedside. Consults called: Pulmonology, palliative care,  respiratory therapist Level of care: Progressive Care Unit Admission status: It is my clinical opinion that referral for OBSERVATION is reasonable and necessary in this patient based on the above information provided. The aforementioned taken together are felt to place the patient at high risk for further clinical deterioration. However, it is anticipated that the patient may be medically stable for discharge from the hospital within 24 to 48 hours.    Shela Leff MD [facility]  If 7PM-7AM, please contact night-coverage [email or web address]  10/01/2022, 10:47 PM

## 2022-10-01 NOTE — ED Triage Notes (Signed)
Pt BIB EMS for lung disease. Pt came here to see her pulmonologist. Pt is having non productive cough. Pt is transitioning to hospice care. Pt came for IV abx. Pt is axox4. Pt is on continuous 9L of O2. VSS.

## 2022-10-02 ENCOUNTER — Encounter (HOSPITAL_COMMUNITY): Payer: Self-pay | Admitting: Internal Medicine

## 2022-10-02 ENCOUNTER — Telehealth: Payer: Self-pay | Admitting: Pulmonary Disease

## 2022-10-02 ENCOUNTER — Telehealth: Payer: Self-pay

## 2022-10-02 ENCOUNTER — Observation Stay (HOSPITAL_COMMUNITY): Payer: Medicare Other

## 2022-10-02 DIAGNOSIS — E785 Hyperlipidemia, unspecified: Secondary | ICD-10-CM | POA: Diagnosis present

## 2022-10-02 DIAGNOSIS — E669 Obesity, unspecified: Secondary | ICD-10-CM | POA: Diagnosis present

## 2022-10-02 DIAGNOSIS — J189 Pneumonia, unspecified organism: Secondary | ICD-10-CM | POA: Diagnosis not present

## 2022-10-02 DIAGNOSIS — Z9981 Dependence on supplemental oxygen: Secondary | ICD-10-CM | POA: Diagnosis not present

## 2022-10-02 DIAGNOSIS — G4700 Insomnia, unspecified: Secondary | ICD-10-CM | POA: Diagnosis present

## 2022-10-02 DIAGNOSIS — J9621 Acute and chronic respiratory failure with hypoxia: Secondary | ICD-10-CM | POA: Insufficient documentation

## 2022-10-02 DIAGNOSIS — Z515 Encounter for palliative care: Secondary | ICD-10-CM | POA: Diagnosis not present

## 2022-10-02 DIAGNOSIS — D649 Anemia, unspecified: Secondary | ICD-10-CM | POA: Diagnosis present

## 2022-10-02 DIAGNOSIS — J479 Bronchiectasis, uncomplicated: Secondary | ICD-10-CM | POA: Diagnosis not present

## 2022-10-02 DIAGNOSIS — Z7189 Other specified counseling: Secondary | ICD-10-CM | POA: Diagnosis not present

## 2022-10-02 DIAGNOSIS — Z66 Do not resuscitate: Secondary | ICD-10-CM | POA: Diagnosis present

## 2022-10-02 DIAGNOSIS — R9431 Abnormal electrocardiogram [ECG] [EKG]: Secondary | ICD-10-CM | POA: Insufficient documentation

## 2022-10-02 DIAGNOSIS — I110 Hypertensive heart disease with heart failure: Secondary | ICD-10-CM | POA: Diagnosis present

## 2022-10-02 DIAGNOSIS — E039 Hypothyroidism, unspecified: Secondary | ICD-10-CM | POA: Diagnosis present

## 2022-10-02 DIAGNOSIS — E874 Mixed disorder of acid-base balance: Secondary | ICD-10-CM | POA: Diagnosis present

## 2022-10-02 DIAGNOSIS — J849 Interstitial pulmonary disease, unspecified: Secondary | ICD-10-CM | POA: Diagnosis present

## 2022-10-02 DIAGNOSIS — I5032 Chronic diastolic (congestive) heart failure: Secondary | ICD-10-CM

## 2022-10-02 DIAGNOSIS — D638 Anemia in other chronic diseases classified elsewhere: Secondary | ICD-10-CM | POA: Diagnosis present

## 2022-10-02 DIAGNOSIS — J84112 Idiopathic pulmonary fibrosis: Secondary | ICD-10-CM | POA: Diagnosis present

## 2022-10-02 DIAGNOSIS — F32A Depression, unspecified: Secondary | ICD-10-CM | POA: Diagnosis present

## 2022-10-02 DIAGNOSIS — I5033 Acute on chronic diastolic (congestive) heart failure: Secondary | ICD-10-CM | POA: Diagnosis present

## 2022-10-02 DIAGNOSIS — E1165 Type 2 diabetes mellitus with hyperglycemia: Secondary | ICD-10-CM | POA: Diagnosis present

## 2022-10-02 DIAGNOSIS — F411 Generalized anxiety disorder: Secondary | ICD-10-CM | POA: Diagnosis present

## 2022-10-02 DIAGNOSIS — G4733 Obstructive sleep apnea (adult) (pediatric): Secondary | ICD-10-CM | POA: Diagnosis present

## 2022-10-02 DIAGNOSIS — I10 Essential (primary) hypertension: Secondary | ICD-10-CM | POA: Diagnosis not present

## 2022-10-02 DIAGNOSIS — J309 Allergic rhinitis, unspecified: Secondary | ICD-10-CM | POA: Diagnosis present

## 2022-10-02 DIAGNOSIS — K219 Gastro-esophageal reflux disease without esophagitis: Secondary | ICD-10-CM | POA: Diagnosis present

## 2022-10-02 DIAGNOSIS — F419 Anxiety disorder, unspecified: Secondary | ICD-10-CM | POA: Diagnosis not present

## 2022-10-02 DIAGNOSIS — E871 Hypo-osmolality and hyponatremia: Secondary | ICD-10-CM | POA: Diagnosis present

## 2022-10-02 DIAGNOSIS — E876 Hypokalemia: Secondary | ICD-10-CM | POA: Diagnosis present

## 2022-10-02 DIAGNOSIS — K5790 Diverticulosis of intestine, part unspecified, without perforation or abscess without bleeding: Secondary | ICD-10-CM | POA: Diagnosis present

## 2022-10-02 DIAGNOSIS — I4891 Unspecified atrial fibrillation: Secondary | ICD-10-CM | POA: Diagnosis present

## 2022-10-02 DIAGNOSIS — J8489 Other specified interstitial pulmonary diseases: Secondary | ICD-10-CM | POA: Diagnosis not present

## 2022-10-02 LAB — CBC
HCT: 32.1 % — ABNORMAL LOW (ref 36.0–46.0)
Hemoglobin: 9.9 g/dL — ABNORMAL LOW (ref 12.0–15.0)
MCH: 30.7 pg (ref 26.0–34.0)
MCHC: 30.8 g/dL (ref 30.0–36.0)
MCV: 99.4 fL (ref 80.0–100.0)
Platelets: 351 10*3/uL (ref 150–400)
RBC: 3.23 MIL/uL — ABNORMAL LOW (ref 3.87–5.11)
RDW: 12.8 % (ref 11.5–15.5)
WBC: 10 10*3/uL (ref 4.0–10.5)
nRBC: 0 % (ref 0.0–0.2)

## 2022-10-02 LAB — TSH: TSH: 0.682 u[IU]/mL (ref 0.350–4.500)

## 2022-10-02 LAB — BASIC METABOLIC PANEL
Anion gap: 14 (ref 5–15)
BUN: 8 mg/dL (ref 8–23)
CO2: 34 mmol/L — ABNORMAL HIGH (ref 22–32)
Calcium: 9.2 mg/dL (ref 8.9–10.3)
Chloride: 89 mmol/L — ABNORMAL LOW (ref 98–111)
Creatinine, Ser: 0.5 mg/dL (ref 0.44–1.00)
GFR, Estimated: >60 mL/min (ref >60)
Glucose, Bld: 166 mg/dL — ABNORMAL HIGH (ref 70–99)
Potassium: 3.5 mmol/L (ref 3.5–5.1)
Sodium: 137 mmol/L (ref 135–145)

## 2022-10-02 LAB — RETICULOCYTES
Immature Retic Fract: 25.2 % — ABNORMAL HIGH (ref 2.3–15.9)
RBC.: 3.25 MIL/uL — ABNORMAL LOW (ref 3.87–5.11)
Retic Count, Absolute: 114.1 10*3/uL (ref 19.0–186.0)
Retic Ct Pct: 3.5 % — ABNORMAL HIGH (ref 0.4–3.1)

## 2022-10-02 LAB — VITAMIN B12: Vitamin B-12: 187 pg/mL (ref 180–914)

## 2022-10-02 LAB — IRON AND TIBC
Iron: 52 ug/dL (ref 28–170)
Saturation Ratios: 17 % (ref 10.4–31.8)
TIBC: 302 ug/dL (ref 250–450)
UIBC: 250 ug/dL

## 2022-10-02 LAB — FOLATE: Folate: 12.1 ng/mL (ref >5.9)

## 2022-10-02 LAB — CBG MONITORING, ED
Glucose-Capillary: 208 mg/dL — ABNORMAL HIGH (ref 70–99)
Glucose-Capillary: 166 mg/dL — ABNORMAL HIGH (ref 70–99)
Glucose-Capillary: 187 mg/dL — ABNORMAL HIGH (ref 70–99)

## 2022-10-02 LAB — MAGNESIUM: Magnesium: 1.8 mg/dL (ref 1.7–2.4)

## 2022-10-02 LAB — GLUCOSE, CAPILLARY
Glucose-Capillary: 238 mg/dL — ABNORMAL HIGH (ref 70–99)
Glucose-Capillary: 233 mg/dL — ABNORMAL HIGH (ref 70–99)

## 2022-10-02 LAB — PROCALCITONIN: Procalcitonin: <0.1 ng/mL

## 2022-10-02 LAB — FERRITIN: Ferritin: 213 ng/mL (ref 11–307)

## 2022-10-02 LAB — BRAIN NATRIURETIC PEPTIDE: B Natriuretic Peptide: 27.3 pg/mL (ref 0.0–100.0)

## 2022-10-02 MED ORDER — INSULIN ASPART 100 UNIT/ML IJ SOLN
0.0000 [IU] | Freq: Three times a day (TID) | INTRAMUSCULAR | Status: DC
  Administered 2022-10-02: 3 [IU] via SUBCUTANEOUS
  Administered 2022-10-02 (×2): 2 [IU] via SUBCUTANEOUS
  Administered 2022-10-03: 3 [IU] via SUBCUTANEOUS
  Administered 2022-10-03: 1 [IU] via SUBCUTANEOUS
  Administered 2022-10-03 – 2022-10-04 (×4): 2 [IU] via SUBCUTANEOUS
  Administered 2022-10-05: 1 [IU] via SUBCUTANEOUS
  Administered 2022-10-05: 3 [IU] via SUBCUTANEOUS
  Administered 2022-10-05: 7 [IU] via SUBCUTANEOUS
  Administered 2022-10-06: 2 [IU] via SUBCUTANEOUS
  Administered 2022-10-06: 5 [IU] via SUBCUTANEOUS
  Administered 2022-10-06: 3 [IU] via SUBCUTANEOUS
  Administered 2022-10-07: 7 [IU] via SUBCUTANEOUS
  Administered 2022-10-07: 1 [IU] via SUBCUTANEOUS
  Administered 2022-10-07: 3 [IU] via SUBCUTANEOUS
  Administered 2022-10-08: 1 [IU] via SUBCUTANEOUS
  Administered 2022-10-08: 9 [IU] via SUBCUTANEOUS
  Administered 2022-10-08: 5 [IU] via SUBCUTANEOUS
  Administered 2022-10-09 (×2): 7 [IU] via SUBCUTANEOUS
  Administered 2022-10-09 – 2022-10-10 (×2): 2 [IU] via SUBCUTANEOUS
  Administered 2022-10-10 (×2): 7 [IU] via SUBCUTANEOUS
  Administered 2022-10-11: 2 [IU] via SUBCUTANEOUS

## 2022-10-02 MED ORDER — LOSARTAN POTASSIUM 50 MG PO TABS
50.0000 mg | ORAL_TABLET | Freq: Every day | ORAL | Status: DC
  Administered 2022-10-02 – 2022-10-11 (×10): 50 mg via ORAL
  Filled 2022-10-02 (×10): qty 1

## 2022-10-02 MED ORDER — LEVOTHYROXINE SODIUM 25 MCG PO TABS
125.0000 ug | ORAL_TABLET | Freq: Every day | ORAL | Status: DC
  Administered 2022-10-02 – 2022-10-11 (×10): 125 ug via ORAL
  Filled 2022-10-02 (×10): qty 1

## 2022-10-02 MED ORDER — MYCOPHENOLATE MOFETIL 500 MG PO TABS: ORAL_TABLET | ORAL | 2 refills | Status: DC

## 2022-10-02 MED ORDER — POTASSIUM CHLORIDE CRYS ER 20 MEQ PO TBCR
20.0000 meq | EXTENDED_RELEASE_TABLET | Freq: Once | ORAL | Status: AC
  Administered 2022-10-02: 20 meq via ORAL
  Filled 2022-10-02: qty 1

## 2022-10-02 MED ORDER — ACETAMINOPHEN 325 MG PO TABS
650.0000 mg | ORAL_TABLET | Freq: Four times a day (QID) | ORAL | Status: DC | PRN
  Administered 2022-10-03 – 2022-10-11 (×6): 650 mg via ORAL
  Filled 2022-10-02 (×7): qty 2

## 2022-10-02 MED ORDER — METHYLPREDNISOLONE SODIUM SUCC 125 MG IJ SOLR
80.0000 mg | Freq: Every day | INTRAMUSCULAR | Status: DC
  Administered 2022-10-02 – 2022-10-07 (×6): 80 mg via INTRAVENOUS
  Filled 2022-10-02 (×6): qty 2

## 2022-10-02 MED ORDER — LORATADINE 10 MG PO TABS
10.0000 mg | ORAL_TABLET | Freq: Every day | ORAL | Status: DC
  Administered 2022-10-02 – 2022-10-11 (×10): 10 mg via ORAL
  Filled 2022-10-02 (×10): qty 1

## 2022-10-02 MED ORDER — INSULIN ASPART 100 UNIT/ML IJ SOLN
0.0000 [IU] | Freq: Every day | INTRAMUSCULAR | Status: DC
  Administered 2022-10-02 (×2): 2 [IU] via SUBCUTANEOUS
  Administered 2022-10-03: 3 [IU] via SUBCUTANEOUS
  Administered 2022-10-04: 5 [IU] via SUBCUTANEOUS
  Administered 2022-10-05: 4 [IU] via SUBCUTANEOUS
  Administered 2022-10-06 – 2022-10-09 (×4): 2 [IU] via SUBCUTANEOUS
  Administered 2022-10-10: 5 [IU] via SUBCUTANEOUS

## 2022-10-02 MED ORDER — ACETAMINOPHEN 650 MG RE SUPP: 650.0000 mg | Freq: Four times a day (QID) | RECTAL | Status: DC | PRN

## 2022-10-02 MED ORDER — GUAIFENESIN ER 600 MG PO TB12
600.0000 mg | ORAL_TABLET | Freq: Two times a day (BID) | ORAL | Status: DC
  Administered 2022-10-02 – 2022-10-04 (×4): 600 mg via ORAL
  Filled 2022-10-02 (×5): qty 1

## 2022-10-02 MED ORDER — POTASSIUM CHLORIDE 10 MEQ/100ML IV SOLN
10.0000 meq | INTRAVENOUS | Status: AC
  Administered 2022-10-02 (×2): 10 meq via INTRAVENOUS
  Filled 2022-10-02: qty 100

## 2022-10-02 MED ORDER — FUROSEMIDE 10 MG/ML IJ SOLN
20.0000 mg | Freq: Two times a day (BID) | INTRAMUSCULAR | Status: DC
  Administered 2022-10-02 – 2022-10-03 (×2): 20 mg via INTRAVENOUS
  Filled 2022-10-02 (×2): qty 2

## 2022-10-02 MED ORDER — MONTELUKAST SODIUM 10 MG PO TABS
10.0000 mg | ORAL_TABLET | Freq: Every day | ORAL | Status: DC
  Administered 2022-10-02 – 2022-10-10 (×10): 10 mg via ORAL
  Filled 2022-10-02 (×10): qty 1

## 2022-10-02 MED ORDER — POTASSIUM CHLORIDE CRYS ER 20 MEQ PO TBCR
40.0000 meq | EXTENDED_RELEASE_TABLET | Freq: Once | ORAL | Status: AC
  Administered 2022-10-02: 40 meq via ORAL
  Filled 2022-10-02: qty 2

## 2022-10-02 MED ORDER — ENOXAPARIN SODIUM 40 MG/0.4ML IJ SOSY
40.0000 mg | PREFILLED_SYRINGE | INTRAMUSCULAR | Status: DC
  Administered 2022-10-02 – 2022-10-10 (×9): 40 mg via SUBCUTANEOUS
  Filled 2022-10-02 (×9): qty 0.4

## 2022-10-02 MED ORDER — PANTOPRAZOLE SODIUM 40 MG PO TBEC
40.0000 mg | DELAYED_RELEASE_TABLET | Freq: Two times a day (BID) | ORAL | Status: DC
  Administered 2022-10-02 – 2022-10-11 (×19): 40 mg via ORAL
  Filled 2022-10-02 (×19): qty 1

## 2022-10-02 MED ORDER — ALPRAZOLAM 0.25 MG PO TABS
0.2500 mg | ORAL_TABLET | Freq: Two times a day (BID) | ORAL | Status: DC | PRN
  Administered 2022-10-02 – 2022-10-03 (×3): 0.25 mg via ORAL
  Filled 2022-10-02 (×3): qty 1

## 2022-10-02 MED ORDER — VERAPAMIL HCL ER 180 MG PO TBCR
180.0000 mg | EXTENDED_RELEASE_TABLET | Freq: Every day | ORAL | Status: DC
  Administered 2022-10-02 – 2022-10-11 (×10): 180 mg via ORAL
  Filled 2022-10-02 (×11): qty 1

## 2022-10-02 MED ORDER — SODIUM CHLORIDE 0.9 % IV SOLN
2.0000 g | INTRAVENOUS | Status: DC
  Administered 2022-10-02 – 2022-10-08 (×7): 2 g via INTRAVENOUS
  Filled 2022-10-02 (×7): qty 20

## 2022-10-02 MED ORDER — AZELASTINE HCL 0.1 % NA SOLN
2.0000 | Freq: Two times a day (BID) | NASAL | Status: DC
  Administered 2022-10-02 – 2022-10-10 (×13): 2 via NASAL
  Filled 2022-10-02: qty 30

## 2022-10-02 MED ORDER — POTASSIUM CHLORIDE 20 MEQ PO PACK
40.0000 meq | PACK | Freq: Once | ORAL | Status: AC
  Administered 2022-10-02: 40 meq via ORAL
  Filled 2022-10-02: qty 2

## 2022-10-02 MED ORDER — MYCOPHENOLATE MOFETIL 250 MG PO CAPS
1500.0000 mg | ORAL_CAPSULE | Freq: Two times a day (BID) | ORAL | Status: DC
  Administered 2022-10-02 – 2022-10-11 (×19): 1500 mg via ORAL
  Filled 2022-10-02 (×20): qty 6

## 2022-10-02 MED ORDER — PIRFENIDONE 801 MG PO TABS
801.0000 mg | ORAL_TABLET | Freq: Three times a day (TID) | ORAL | Status: DC
  Administered 2022-10-03: 801 mg via ORAL
  Filled 2022-10-02: qty 1

## 2022-10-02 MED ORDER — ROSUVASTATIN CALCIUM 5 MG PO TABS
10.0000 mg | ORAL_TABLET | Freq: Every day | ORAL | Status: DC
  Administered 2022-10-02 – 2022-10-11 (×10): 10 mg via ORAL
  Filled 2022-10-02 (×10): qty 2

## 2022-10-02 MED ORDER — POTASSIUM CHLORIDE 20 MEQ PO PACK
40.0000 meq | PACK | Freq: Once | ORAL | Status: DC
  Filled 2022-10-02: qty 2

## 2022-10-02 NOTE — Assessment & Plan Note (Addendum)
Anemia of chronic disease, cell count has been stable. Her discharge hgb is 9.9

## 2022-10-02 NOTE — Telephone Encounter (Signed)
Spoke with [name],  pt's daughter (per DPR) who is requesting CellCept refill which was sent in to [facility]. Nothing further needed.

## 2022-10-02 NOTE — Telephone Encounter (Signed)
[name] daughter states patient needs refill for Cellcept 500 mg. Pharmacy is [facility] order. [place] phone number is [phone] and [phone].

## 2022-10-02 NOTE — Assessment & Plan Note (Addendum)
Uncontrolled hyperglycemia, steroid induced hyperglycemia.  Patient was placed on insulin therapy during her hospitalization, for glucose cover and monitoring.  At her discharge her capillary glucose is 170 mg/dl.   Plan to continue taper steroids.

## 2022-10-02 NOTE — Assessment & Plan Note (Addendum)
Hypomagnesemia. Hyponatremia.   Renal function today with serum cr at 0,66 with K at 4,0 and serum bicarbonate at 33.  Na is 134   Plan to continue diuresis with furosemide 20 mg and will add K supplementation to avoid hypokalemia.

## 2022-10-02 NOTE — Progress Notes (Signed)
[facility] Progress Note  Patient: Kaitlin Hamilton    [id number]  DOA: 10/01/2022    Date of Service: the patient was seen and examined on 10/02/2022  Brief hospital course: 72 y.o. female with past medical history significant for UIP fibrosis, chronic hypoxemic respiratory failure on 9 L O2, OSA on CPAP, type 2 diabetes, hypertension, diastolic heart failure, and hypothyroidism.  As patient's condition has progressed, in the last few months, pulmonary has started discussions with patient about palliative care.  Patient had noted over the last few weeks, that even with mild exertion, she gets significantly short of breath and noted to be hypoxic.  She presented to the emergency room on 3/13 with complaints of the same.  Workup did not reveal any immediate acute findings such as heart failure or acute infection.  Patient not tachycardic or afebrile and oxygen saturations in the high 90s on her baseline of 9 L, however with any type of exertion, oxygen saturations dropped into the 60s.  Hospitalist were called in for further evaluation.  Palliative care consulted.     Assessment and Plan: * ILD (interstitial lung disease) ([place]) She is currently on Cellcept, steroids, and Verapamil  Acute on chronic respiratory failure with hypoxemia ([place]) I suspect this is more of a worsening of her underlying condition rather than an acute reversible cause.  Given her advanced degree of respiratory failure even before her worsening hypoxia, she would be very appropriate for palliative care, who have been consulted.    That said, patient does report orthostasis.  She had been started on Lasix several months ago, but had had it adjusted and changed to as needed due to issues with hypokalemia.  Given her need for prednisone leading to chronic swelling, chronic shortness of breath, it may be difficult for her to ascertain acute need for Lasix as needed.  Will try to treat with IV Lasix  here.  Hypomagnesemia Replacing as needed  Hypokalemia Replacing as needed.  Low on admission 2.6, probably from attempting to use Lasix at home.  Chronic heart failure with preserved ejection fraction (HFpEF) (HCC) Checking BNP.  Echocardiogram done in OCt of 2023 notes preserved ejection fraction and grade 1 diastolic dysfunction  HTN (hypertension) Blood pressure stable  Non-insulin dependent type 2 diabetes mellitus (HCC) CBG's stable, sliding scale  Normocytic anemia Secondary to her chronic disease.  Slightly lower than previous baseline, but no indications for acute bleed  Hypothyroidism Continue synthroid  Obesity (BMI 30-39.9) Meets criteria with BMI >30       Body mass index is 33.25 kg/m.        Consultants: Pulmonary Palliative care  Procedures: None  Antimicrobials: None  Code Status: DNR   Subjective: Complains of feeling very hot, short of breath with any type of exertion  Objective: Mild tachypnea Vitals:   10/02/22 1630 10/02/22 1715  BP: (!) 146/84 108/73  Pulse: 90 87  Resp: (!) 28 18  Temp:    SpO2: 91% 100%   No intake or output data in the 24 hours ending 10/02/22 1736 Filed Weights   10/01/22 1734  Weight: 93.4 kg   Body mass index is 33.25 kg/m.  Exam:  General: Alert and oriented x 3, fatigued HEENT: Normocephalic, atraumatic, mucous membranes are slightly dry Cardiovascular: Regular rate and rhythm, occasional ectopic beat Respiratory: Poor inspiratory effort with decreased breath sounds throughout and mild prolonged expiratory phase Abdomen: Soft, nontender, nondistended, positive bowel sounds Musculoskeletal: No clubbing or cyanosis, trace pitting edema Skin:  No skin breaks, tears or lesions Psychiatry: Appropriate, no evidence of psychoses Neurology: No focal deficits  Data Reviewed: Normal BNP, potassium corrected  Disposition:  Status is: Inpatient    Anticipated discharge date: 3/16  Remaining  issues to be resolved so that patient can be discharged:  -Possible improvement in respiratory function -Evaluation and plan of care by palliative care   Family Communication: Daughter at bedside DVT Prophylaxis: enoxaparin (LOVENOX) injection 40 mg Start: 10/02/22 1400    Author: Annita Brod ,MD 10/02/2022 5:36 PM  To reach On-call, see care teams to locate the attending and reach out via [email or web address]. Between 7PM-7AM, please contact night-coverage If you still have difficulty reaching the attending provider, please page the [facility] (Director on Call) for [facility] on amion for assistance.

## 2022-10-02 NOTE — Assessment & Plan Note (Deleted)
I suspect this is more of a worsening of her underlying condition rather than an acute reversible cause.  Given her advanced degree of respiratory failure even before her worsening hypoxia, she would be very appropriate for palliative care, who have been consulted.    That said, patient does report orthostasis.  She had been started on Lasix several months ago, but had had it adjusted and changed to as needed due to issues with hypokalemia.  Given her need for prednisone leading to chronic swelling, chronic shortness of breath, it may be difficult for her to ascertain acute need for Lasix as needed.  Received 2 doses of IV Lasix and diuresed over a liter.  Patient states breathing is a bit more comfortable and daughter observes that with mild activity, she does not have significant oxygen desaturation.    Unable to go to skilled nursing, LTAC or CIR.  Does not meet criteria.  Will have to go home with home health.

## 2022-10-02 NOTE — Assessment & Plan Note (Addendum)
Continue blood pressure control with losartan and verapamil.   Dyslipidemia, continue with statin therapy.

## 2022-10-02 NOTE — Assessment & Plan Note (Deleted)
Replacing as needed 

## 2022-10-02 NOTE — Assessment & Plan Note (Addendum)
Acute on chronic diastolic heart failure.  Echocardiogram from 2023 with preserved LV systolic function EF 60 to 65%, RV with preserved systolic function, RVSP 123XX123 mmHg. Mild dilatated LA, no significant valvular disease.   Patient had diuresis during her hospitalization with furosemide.  Contraction metabolic alkalosis.  Plan to continue diuresis with furosemide 20 mg po daily to keep negative fluid balance.

## 2022-10-02 NOTE — ED Notes (Signed)
ED TO INPATIENT HANDOFF REPORT  ED Nurse Name and Phone #: [phone]  S Name/Age/Gender Kaitlin Hamilton 72 y.o. female Room/Bed: 002C/002C  Code Status   Code Status: DNR  Home/SNF/Other Home Patient oriented to: self, place, time, and situation Is this baseline? No   Triage Complete: Triage complete  Chief Complaint ILD (interstitial lung disease) ([place]) [J84.9]  Triage Note Pt BIB EMS for lung disease. Pt came here to see her pulmonologist. Pt is having non productive cough. Pt is transitioning to hospice care. Pt came for IV abx. Pt is axox4. Pt is on continuous 9L of O2. VSS.    Allergies Allergies  Allergen Reactions   Adhesive [Tape]     Redness, bruising with any adhesives- bandaids, patches, etc.    Atovaquone Itching   Codeine Hives and Swelling   Demerol [Meperidine] Hives and Swelling   Hydrocodone Nausea Only   Augmentin [Amoxicillin-Pot Clavulanate] Other (See Comments)    Upsets stomach   Azithromycin Nausea And Vomiting    Upset stomach   Trazodone And Nefazodone Itching   Sulfa Antibiotics Rash    Level of Care/Admitting Diagnosis ED Disposition     ED Disposition  Admit   Condition  --   [place]: [place] [[id number]]  Level of Care: Progressive [102]  Admit to Progressive based on following criteria: RESPIRATORY PROBLEMS hypoxemic/hypercapnic respiratory failure that is responsive to NIPPV (BiPAP) or High Flow Nasal Cannula (6-80 lpm). Frequent assessment/intervention, no > Q2 hrs < Q4 hrs, to maintain oxygenation and pulmonary hygiene.  May place patient in observation at [facility] or [place] if equivalent level of care is available:: Yes  Covid Evaluation: Symptomatic Person Under Investigation (PUI) or recent exposure (last 10 days) *Testing Required*  Diagnosis: ILD (interstitial lung disease) [facility]) [id number]  Admitting Physician: Shela Leff [id number]  Attending Physician: Shela Leff [id number]           B Medical/Surgery History Past Medical History:  Diagnosis Date   Allergic rhinitis    uses Flonase daily   Anesthesia complication    Per pt/ past endoscopy in 2015, the anesthetic spray in back throat caused her to have breathing problems   Anxiety    Bronchitis    Chicken pox    Constipation    takes Fiber daily   Depression    Diverticulosis    Dysphagia    GERD (gastroesophageal reflux disease)    takes Protonix daily   H/O hiatal hernia    Hemorrhoids    History of bronchitis    2013   History of colon polyps    History of kidney stones    Hyperlipidemia    lost 43 pounds and no meds required at present   Hypertension    takes Verapamil and HCTZ daily   Hypokalemia    Hypothyroidism (acquired)    takes Synthroid daily   Insomnia    doesn't take any meds for this   Joint swelling    left thumb   Migraines    last one about a month ago   Non-insulin dependent type 2 diabetes mellitus ([place])    takes Glipizide daily   OA (osteoarthritis)    left knee   Oxygen deficiency    Oxygen dependent    Pt is on continuous O2 at 3 liters!   Pneumonia    last time in 2006   PONV (postoperative nausea and vomiting)    Shortness of breath  with exertion;takes Singulair daily as well as Flonase   Urinary frequency    Past Surgical History:  Procedure Laterality Date   ABDOMINAL EXPLORATION SURGERY     For Ovarian Cyst    APPENDECTOMY     CHOLECYSTECTOMY     COLONOSCOPY  05/2013   TA, severe diverticulosis, rpt 3 yrs ([place])   ENTEROSCOPY N/A 08/12/2013   Procedure: ENTEROSCOPY;  Surgeon: Lafayette Dragon, MD;  Location: [facility];  Service: Endoscopy;  Laterality: N/A;   ESOPHAGOGASTRODUODENOSCOPY     left knee arthroscopy     LITHOTRIPSY     x 2   LUNG BIOPSY Right 06/29/2013   Procedure: LUNG BIOPSY;  Surgeon: Melrose Nakayama, MD;  Location: [place];  Service: Thoracic;  Laterality: Right;   TCS     TUBAL LIGATION     VIDEO ASSISTED  THORACOSCOPY Right 06/29/2013   Procedure: VIDEO ASSISTED THORACOSCOPY;  Surgeon: Melrose Nakayama, MD;  Location: [facility];  Service: Thoracic;  Laterality: Right;     A IV Location/Drains/Wounds Patient Lines/Drains/Airways Status     Active Line/Drains/Airways     Name Placement date Placement time Site Days   Peripheral IV 10/01/22 20 G Anterior;Distal;Right Forearm 10/01/22  1754  Forearm  1   Peripheral IV 10/01/22 20 G 2.5" Anterior;Right Forearm 10/01/22  2348  Forearm  1   External Urinary Catheter 10/02/22  0203  --  less than 1   Incision 06/29/13 Chest Right 06/29/13  1224  -- 3382            Intake/Output Last 24 hours No intake or output data in the 24 hours ending 10/02/22 1537  Labs/Imaging Results for orders placed or performed during the hospital encounter of 10/01/22 (from the past 48 hour(s))  Blood culture (routine x 2)     Status: None (Preliminary result)   Collection Time: 10/01/22  5:59 PM   Specimen: BLOOD RIGHT HAND  Result Value Ref Range   Specimen Description BLOOD RIGHT HAND    Special Requests      BOTTLES DRAWN AEROBIC AND ANAEROBIC Blood Culture adequate volume   Culture      NO GROWTH < 24 HOURS Performed at [facility], [place] [address], [place], [place] [address]    Report Status PENDING   Lactic acid, plasma     Status: None   Collection Time: 10/01/22  6:05 PM  Result Value Ref Range   Lactic Acid, Venous 1.2 0.5 - 1.9 mmol/L    Comment: Performed at [facility] [address] [place], [place] [address]  CBC with Differential     Status: Abnormal   Collection Time: 10/01/22  6:15 PM  Result Value Ref Range   WBC 10.6 (H) 4.0 - 10.5 K/uL   RBC 3.28 (L) 3.87 - 5.11 MIL/uL   Hemoglobin 9.9 (L) 12.0 - 15.0 g/dL   HCT 32.7 (L) 36.0 - 46.0 %   MCV 99.7 80.0 - 100.0 fL   MCH 30.2 26.0 - 34.0 pg   MCHC 30.3 30.0 - 36.0 g/dL   RDW 12.8 11.5 - 15.5 %   Platelets 402 (H) 150 - 400 K/uL   nRBC 0.2 0.0 - 0.2 %    Neutrophils Relative % 77 %   Neutro Abs 8.1 (H) 1.7 - 7.7 K/uL   Lymphocytes Relative 13 %   Lymphs Abs 1.4 0.7 - 4.0 K/uL   Monocytes Relative 7 %   Monocytes Absolute 0.7 0.1 -  1.0 K/uL   Eosinophils Relative 2 %   Eosinophils Absolute 0.3 0.0 - 0.5 K/uL   Basophils Relative 0 %   Basophils Absolute 0.0 0.0 - 0.1 K/uL   Immature Granulocytes 1 %   Abs Immature Granulocytes 0.09 (H) 0.00 - 0.07 K/uL    Comment: Performed at [facility] [address] [place], [place] [id number]  Basic metabolic panel     Status: Abnormal   Collection Time: 10/01/22  6:15 PM  Result Value Ref Range   Sodium 138 135 - 145 mmol/L   Potassium 2.4 (LL) 3.5 - 5.1 mmol/L    Comment: CRITICAL RESULT CALLED TO, READ BACK BY AND VERIFIED WITH J DODD,RN 1955 10/01/2022 WBOND   Chloride 85 (L) 98 - 111 mmol/L   CO2 39 (H) 22 - 32 mmol/L   Glucose, Bld 110 (H) 70 - 99 mg/dL    Comment: Glucose reference range applies only to samples taken after fasting for at least 8 hours.   BUN 8 8 - 23 mg/dL   Creatinine, Ser 0.52 0.44 - 1.00 mg/dL   Calcium 9.8 8.9 - 10.3 mg/dL   GFR, Estimated >60 >60 mL/min    Comment: (NOTE) Calculated using the CKD-EPI Creatinine Equation (2021)    Anion gap 14 5 - 15    Comment: Performed at [facility] [address] [place], [place] [address]  Lipase, blood     Status: None   Collection Time: 10/01/22  6:15 PM  Result Value Ref Range   Lipase 25 11 - 51 U/L    Comment: Performed at [facility] [address] [place], [place] [address]  Troponin I (High Sensitivity)     Status: None   Collection Time: 10/01/22  6:15 PM  Result Value Ref Range   Troponin I (High Sensitivity) 7 <18 ng/L    Comment: (NOTE) Elevated high sensitivity troponin I (hsTnI) values and significant  changes across serial measurements may suggest ACS but many other  chronic and acute conditions are known to elevate hsTnI results.  Refer to the "Links" section for chest pain  algorithms and additional  guidance. Performed at [facility], [place] [address], [place], [place] [address]   Blood culture (routine x 2)     Status: None (Preliminary result)   Collection Time: 10/01/22  6:17 PM   Specimen: BLOOD  Result Value Ref Range   Specimen Description BLOOD SITE NOT SPECIFIED    Special Requests      BOTTLES DRAWN AEROBIC ONLY Blood Culture results may not be optimal due to an inadequate volume of blood received in culture bottles   Culture      NO GROWTH < 24 HOURS Performed at [facility] [address], [place], [place] [address]    Report Status PENDING   Troponin I (High Sensitivity)     Status: None   Collection Time: 10/01/22  8:19 PM  Result Value Ref Range   Troponin I (High Sensitivity) 7 <18 ng/L    Comment: (NOTE) Elevated high sensitivity troponin I (hsTnI) values and significant  changes across serial measurements may suggest ACS but many other  chronic and acute conditions are known to elevate hsTnI results.  Refer to the "Links" section for chest pain algorithms and additional  guidance. Performed at [facility], [place] [address], [place], [place] [address]   Magnesium     Status: Abnormal   Collection Time:  10/01/22  8:19 PM  Result Value Ref Range   Magnesium 1.3 (L) 1.7 - 2.4 mg/dL    Comment: Performed at [facility] [address] [place], [place] [address]  Expectorated Sputum Assessment w Gram Stain, Rflx to Resp Cult     Status: None (Preliminary result)   Collection Time: 10/02/22 12:51 AM   Specimen: Expectorated Sputum  Result Value Ref Range   Specimen Description EXPECTORATED SPUTUM    Special Requests NONE    Sputum evaluation      THIS SPECIMEN IS ACCEPTABLE FOR SPUTUM CULTURE Performed at [facility], [place] [address] [place], [place] [address]    Report Status PENDING   Culture, Respiratory w Gram Stain     Status: None (Preliminary result)   Collection Time: 10/02/22 12:51 AM   Result Value Ref Range   Specimen Description EXPECTORATED SPUTUM    Special Requests NONE Reflexed from [id number]    Gram Stain      FEW SQUAMOUS EPITHELIAL CELLS PRESENT FEW WBC PRESENT,BOTH PMN AND MONONUCLEAR FEW GRAM POSITIVE RODS FEW GRAM NEGATIVE RODS FEW GRAM POSITIVE COCCI IN PAIRS Performed at [facility], [place] [address] [place], [place] [address]    Culture PENDING    Report Status PENDING   CBG monitoring, ED     Status: Abnormal   Collection Time: 10/02/22  1:23 AM  Result Value Ref Range   Glucose-Capillary 208 (H) 70 - 99 mg/dL    Comment: Glucose reference range applies only to samples taken after fasting for at least 8 hours.  CBC     Status: Abnormal   Collection Time: 10/02/22  5:45 AM  Result Value Ref Range   WBC 10.0 4.0 - 10.5 K/uL   RBC 3.23 (L) 3.87 - 5.11 MIL/uL   Hemoglobin 9.9 (L) 12.0 - 15.0 g/dL   HCT 32.1 (L) 36.0 - 46.0 %   MCV 99.4 80.0 - 100.0 fL   MCH 30.7 26.0 - 34.0 pg   MCHC 30.8 30.0 - 36.0 g/dL   RDW 12.8 11.5 - 15.5 %   Platelets 351 150 - 400 K/uL   nRBC 0.0 0.0 - 0.2 %    Comment: Performed at [facility], [place] [address], [place], [place] [id number]  Basic metabolic panel     Status: Abnormal   Collection Time: 10/02/22  5:45 AM  Result Value Ref Range   Sodium 137 135 - 145 mmol/L   Potassium 3.5 3.5 - 5.1 mmol/L   Chloride 89 (L) 98 - 111 mmol/L   CO2 34 (H) 22 - 32 mmol/L   Glucose, Bld 166 (H) 70 - 99 mg/dL    Comment: Glucose reference range applies only to samples taken after fasting for at least 8 hours.   BUN 8 8 - 23 mg/dL   Creatinine, Ser 0.50 0.44 - 1.00 mg/dL   Calcium 9.2 8.9 - 10.3 mg/dL   GFR, Estimated >60 >60 mL/min    Comment: (NOTE) Calculated using the CKD-EPI Creatinine Equation (2021)    Anion gap 14 5 - 15    Comment: Performed at [facility] [address] [place], [place] [address]  Brain natriuretic peptide     Status: None   Collection Time: 10/02/22  5:45 AM  Result Value  Ref Range   B Natriuretic Peptide 27.3 0.0 - 100.0 pg/mL    Comment: Performed at [facility] [address], [place], [place]  [id number]  Magnesium     Status: None   Collection Time: 10/02/22  5:45 AM  Result Value Ref Range   Magnesium 1.8 1.7 - 2.4 mg/dL    Comment: Performed at [facility] [address], [place], [place] [address]  Vitamin B12     Status: None   Collection Time: 10/02/22  5:45 AM  Result Value Ref Range   Vitamin B-12 187 180 - 914 pg/mL    Comment: (NOTE) This assay is not validated for testing neonatal or myeloproliferative syndrome specimens for Vitamin B12 levels. Performed at [facility], [place] [address], [place], [place] [address]   Folate     Status: None   Collection Time: 10/02/22  5:45 AM  Result Value Ref Range   Folate 12.1 >5.9 ng/mL    Comment: Performed at [facility] [address] [place], [place] [address]  Iron and TIBC     Status: None   Collection Time: 10/02/22  5:45 AM  Result Value Ref Range   Iron 52 28 - 170 ug/dL   TIBC 302 250 - 450 ug/dL   Saturation Ratios 17 10.4 - 31.8 %   UIBC 250 ug/dL    Comment: Performed at [facility], [place] [address] [place], [place] [address]  Ferritin     Status: None   Collection Time: 10/02/22  5:45 AM  Result Value Ref Range   Ferritin 213 11 - 307 ng/mL    Comment: Performed at [facility], [place] [address], [place], [place] [address]  Reticulocytes     Status: Abnormal   Collection Time: 10/02/22  5:45 AM  Result Value Ref Range   Retic Ct Pct 3.5 (H) 0.4 - 3.1 %   RBC. 3.25 (L) 3.87 - 5.11 MIL/uL   Retic Count, Absolute 114.1 19.0 - 186.0 K/uL   Immature Retic Fract 25.2 (H) 2.3 - 15.9 %    Comment: Performed at [facility] [address], [place], [place] [address]  TSH     Status: None   Collection Time: 10/02/22  5:45 AM  Result Value Ref Range   TSH 0.682 0.350 - 4.500 uIU/mL    Comment: Performed by a 3rd Generation assay with a  functional sensitivity of <=0.01 uIU/mL. Performed at [facility], [place] [address] [place], [place] [address]   CBG monitoring, ED     Status: Abnormal   Collection Time: 10/02/22  7:45 AM  Result Value Ref Range   Glucose-Capillary 166 (H) 70 - 99 mg/dL    Comment: Glucose reference range applies only to samples taken after fasting for at least 8 hours.  CBG monitoring, ED     Status: Abnormal   Collection Time: 10/02/22 12:31 PM  Result Value Ref Range   Glucose-Capillary 187 (H) 70 - 99 mg/dL    Comment: Glucose reference range applies only to samples taken after fasting for at least 8 hours.   CT Chest High Resolution  Result Date: 10/02/2022 CLINICAL DATA:  Cough, interstitial lung disease EXAM: CT CHEST WITHOUT CONTRAST TECHNIQUE: Multidetector CT imaging of the chest was performed following the standard protocol without intravenous contrast. High resolution imaging of the lungs, as well as inspiratory and expiratory imaging, was performed. RADIATION DOSE REDUCTION: This exam was performed according to the departmental dose-optimization program which includes automated exposure control, adjustment of the mA and/or kV according to patient size and/or use of iterative reconstruction  technique. COMPARISON:  02/07/2022, 03/06/2021 FINDINGS: Cardiovascular: No significant vascular findings. Cardiomegaly. No pericardial effusion. Mediastinum/Nodes: No enlarged mediastinal, hilar, or axillary lymph nodes. Small hiatal hernia. Thyroid gland, trachea, and esophagus demonstrate no significant findings. Lungs/Pleura: Examination limited by breath motion artifact, and all series provided are performed at essentially complete expiration. Within this limitation, no significant change in mild to moderate pulmonary fibrosis in a pattern with apical to basal gradient featuring irregular peripheral interstitial opacity, septal thickening, traction bronchiectasis, subpleural bronchiolectasis, and small  areas of honeycombing at the lung bases. Evidence of prior right lung wedge biopsy. No acute appearing airspace opacity. Lobular air trapping. No pleural effusion or pneumothorax. Upper Abdomen: No acute abnormality. Musculoskeletal: No chest wall abnormality. No acute osseous findings. IMPRESSION: 1. Examination limited by breath motion artifact, and all series provided are performed at essentially complete expiration. Within this limitation, no significant change in mild to moderate pulmonary fibrosis in a pattern with apical to basal gradient featuring irregular peripheral interstitial opacity, septal thickening, traction bronchiectasis, subpleural bronchiolectasis, and small areas of honeycombing at the lung bases. Findings remain consistent with UIP per consensus guidelines: Diagnosis of Idiopathic Pulmonary Fibrosis: An Official ATS/ERS/JRS/ALAT Clinical Practice Guideline. [place], Iss 5, [phone], Mar 21 2017. 2. Lobular air trapping, consistent with small airways disease. 3. Cardiomegaly. 4. Small hiatal hernia. Electronically Signed   By: Delanna Ahmadi M.D.   On: 10/02/2022 08:31   DG Chest Portable 1 View  Result Date: 10/01/2022 CLINICAL DATA:  Shortness of breath EXAM: PORTABLE CHEST 1 VIEW COMPARISON:  05/29/2022, CT chest 02/07/2022 FINDINGS: Bilateral reticular and mild ground-glass opacity consistent with chronic lung disease. No definite acute superimposed airspace disease or effusion. Stable cardiomediastinal silhouette. No pneumothorax. Postsurgical changes in the right upper lung. IMPRESSION: Chronic interstitial lung disease. No definite acute superimposed process. Electronically Signed   By: Donavan Foil M.D.   On: 10/01/2022 18:46    Pending Labs Unresulted Labs (From admission, onward)     Start     Ordered   10/02/22 1016  Resp panel by RT-PCR (RSV, Flu A&B, Covid) Anterior Nasal Swab  (Tier 2 - SymptomaticResp panel by RT-PCR (RSV, Flu A&B, Covid))   ONCE - URGENT,   URGENT        10/02/22 1015   10/02/22 0911  Respiratory (~20 pathogens) panel by PCR  (Respiratory panel by PCR (~20 pathogens, ~24 hr TAT)  w precautions)  Once,   R        10/02/22 0910   10/02/22 0805  Procalcitonin  Once,   R       References:    Procalcitonin Lower Respiratory Tract Infection AND Sepsis Procalcitonin Algorithm   10/02/22 0805            Vitals/Pain Today's Vitals   10/02/22 1115 10/02/22 1130 10/02/22 1300 10/02/22 1411  BP: 117/62 125/60 125/76   Pulse: 77 80 86   Resp: (!) 24 (!) 22 (!) 21   Temp:    98.1 F (36.7 C)  TempSrc:    Oral  SpO2: 96% 99% 97%   Weight:      Height:      PainSc:        Isolation Precautions Droplet precaution  Medications Medications  cefTRIAXone (ROCEPHIN) 2 g in sodium chloride 0.9 % 100 mL IVPB (0 g Intravenous Stopped 10/02/22 1003)  methylPREDNISolone sodium succinate (SOLU-MEDROL) 125 mg/2 mL injection 80 mg (80 mg Intravenous Given 10/02/22 0904)  losartan (COZAAR) tablet 50 mg (50 mg Oral Given 10/02/22 0904)  rosuvastatin (CRESTOR) tablet 10 mg (10 mg Oral Given 10/02/22 0903)  verapamil (CALAN-SR) CR tablet 180 mg (180 mg Oral Given 10/02/22 0931)  ALPRAZolam Duanne Moron) tablet 0.25 mg (0.25 mg Oral Given 10/02/22 0935)  levothyroxine (SYNTHROID) tablet 125 mcg (125 mcg Oral Given 10/02/22 0504)  pantoprazole (PROTONIX) EC tablet 40 mg (40 mg Oral Given 10/02/22 0904)  mycophenolate (CELLCEPT) capsule 1,500 mg (1,500 mg Oral Given 10/02/22 0931)  montelukast (SINGULAIR) tablet 10 mg (10 mg Oral Given 10/02/22 0122)  Pirfenidone TABS 801 mg (801 mg Oral Not Given 10/02/22 1235)  enoxaparin (LOVENOX) injection 40 mg (40 mg Subcutaneous Given 10/02/22 1334)  acetaminophen (TYLENOL) tablet 650 mg (has no administration in time range)    Or  acetaminophen (TYLENOL) suppository 650 mg (has no administration in time range)  insulin aspart (novoLOG) injection 0-9 Units (2 Units Subcutaneous Given 10/02/22 1334)   insulin aspart (novoLOG) injection 0-5 Units (2 Units Subcutaneous Given 10/02/22 0128)  guaiFENesin (MUCINEX) 12 hr tablet 600 mg (600 mg Oral Given 10/02/22 0903)  loratadine (CLARITIN) tablet 10 mg (10 mg Oral Given 10/02/22 1233)  azelastine (ASTELIN) 0.1 % nasal spray 2 spray (2 sprays Each Nare Given 10/02/22 1234)  potassium chloride 10 mEq in 100 mL IVPB (0 mEq Intravenous Stopped 10/02/22 0120)  methylPREDNISolone sodium succinate (SOLU-MEDROL) 40 mg/mL injection 40 mg (40 mg Intravenous Given 10/01/22 2239)  magnesium sulfate IVPB 2 g 50 mL (0 g Intravenous Stopped 10/02/22 0028)  cefTRIAXone (ROCEPHIN) 1 g in sodium chloride 0.9 % 100 mL IVPB (0 g Intravenous Stopped 10/02/22 0120)  potassium chloride (KLOR-CON) packet 40 mEq (40 mEq Oral Given 10/02/22 0126)  potassium chloride 10 mEq in 100 mL IVPB (0 mEq Intravenous Stopped 10/02/22 0348)  potassium chloride SA (KLOR-CON M) CR tablet 20 mEq (20 mEq Oral Given 10/02/22 0311)    Mobility walks with person assist     Focused Assessments Cardiac Assessment Handoff:    No results found for: "CKTOTAL", "CKMB", "CKMBINDEX", "TROPONINI" No results found for: "DDIMER" Does the Patient currently have chest pain? No   , Pulmonary Assessment Handoff:  Lung sounds: Bilateral Breath Sounds: Diminished L Breath Sounds: Diminished O2 Device:  (oximizer) O2 Flow Rate (L/min): 10 L/min    R Recommendations: See Admitting Provider Note  Report given to:   Additional Notes:

## 2022-10-02 NOTE — Assessment & Plan Note (Signed)
Continue synthroid.

## 2022-10-02 NOTE — Hospital Course (Addendum)
Mrs. Ellingson was admitted to the hospital with the working diagnosis of acute on chronic hypoxemic respiratory failure.   72 y.o. female with past medical history significant for UIP fibrosis, chronic hypoxemic respiratory failure on 9 L O2, OSA on CPAP, type 2 diabetes, hypertension, diastolic heart failure, and hypothyroidism.  As patient's condition has progressed, in the last few months, pulmonary has started discussions with patient about palliative care.  Patient had noted over the last few weeks, that even with mild exertion, she gets significantly short of breath and noted to be hypoxic. On 03/05 patient contacted Pulmonary due to rapid worsening dyspnea along with purulent sputum and fever. She had azithromycin prescribed and her rituximab dose was held.  After completing antibiotic she continue to have symptoms and Augmentin was called, that she was not able to take due to GI symptoms. The day prior admission she had levofloxacin prescribed.   On her initial physical examination her blood pressure was 114/55, HR 89, RR 26 and 02 saturation 98% on supplemental 02 per [place], lungs with rales at bases, increased work of breathing, heart with S1 and S2 present and rhythmic, abdomen with no distention and no lower extremity edema.    Na 138, K 2,4 Cl 85 bicarbonate 39, glucose 110, bun 8 cr 0,52  Wbc 10,6 hgb 9.9 plt 402   Chest radiograph with mild cardiomegaly, with bilateral hilar vascular congestion, increase interstitial infiltrates at the left lower lobe.  CT chest with bilateral ground glass opacities, interlobular and intralobular septal thickening and traction bronchiectasis, honeycombing at bases, consistent with UIP.   EKG 91 bpm, normal axis, normal qtc, atrial fibrillation rhythm with no significant ST segment or T wave changes.   Patient with any type of exertion, oxygen saturations dropped into the 60s.    Palliative care consulted.  While patient has had some improvement, still with  significant oxygen desaturation with ambulation.  Placed on scheduled p.o. Lasix.  Skilled nursing will not take patient because baseline oxygen to high (their cutoff is 6 L).  Not candidate for  LTAC or CIR if possible  Patient with rapid progressive disease, very poor prognosis, will call hospice.  Possible candidate for residential hospice.   03/22 patient and her family have decided for home hospice, patient will be discharged home tomorrow.

## 2022-10-02 NOTE — ED Notes (Signed)
Prog

## 2022-10-02 NOTE — Consult Note (Addendum)
NAME:  Kaitlin Hamilton, MRN:  [id number], DOB:  1951/01/13, LOS: 0 ADMISSION DATE:  10/01/2022, CONSULTATION DATE:  10/02/2022 REFERRING MD:  Dr. [place] Pink, CHIEF COMPLAINT:  Acute on Chronic Hypoxemic Respiratory Failure   History of Present Illness:  72 y/o female with PMH of UIP fibrosis with chronic hypoxemic respiratory failure on 9 L home O2 with oxymizer, T2DM, HTN, HFpEF, GERD, hypothyroidism, GAD, depression, and insomnia presenting with progressive dyspnea with increased oxygen requirements and associated productive cough and fevers over the past 2 weeks. She is followed in the pulmonology clinic for her UIP fibrosis.  She is currently on CellCept, pirfenidone, and prednisone 5-10 mg daily as well as had 1 rituximab infusion on 09/16/2022. She also takes zyrtec and Singulair but no inhalers. Since infusion she noticed increased congestion, postnasal drip, productive cough with green sputum, and intermittent fevers.  She was treated with a course of azithromycin on 09/23/2022.  She has had some improvement with her cough getting a little better in her sputum going from dark green to light or green.  She does have much more shortness of breath with needing extra supplemental oxygen from her 9 L when she is not at rest. She last took a dose of lasix 20 mg on 3/12 PM.   In the ED she was afebrile, hemodynamically stable, with SpO2 >90 on 9L O2 through oxymixer. Slight leukocytosis at 10.3, CXR with diffuse fibrosis but no focal findings, high res chest CT showed stable moderate pulmonary fibrosis. She also had a potassium of 2.4, Mg of 1.3, bicarb of 39, normal lactic acid, no troponin elevation. She was given a dose of ceftriaxone, solu-medrol 40 mg, and IV Mg/K.  Admitted to hospitalist.  Goals of care discussed with patient and daughter. They were planing on meeting with palliative care outpatient in the near future. Patient fully understands her progressive lung disease and the likely trajectory. She is  consistent and fully understands DNR status. Understands hospice in the context of palliative care as well as comfort care. Goal of this admission is to optimize medically and discharge home. On board with palliative consult here for further discussions.   Pertinent  Medical History  UIP Fibrosis Chronic Hypoxemic Respiratory Failure on 9L O2 at home T2DM HTN HFpEF GERD Hypothyroidism GAD/Depression Insomnia  Significant Hospital Events: Including procedures, antibiotic start and stop dates in addition to other pertinent events   3/13 IV steroids and ctx  Interim History / Subjective:  See HPI  Objective   Blood pressure 122/75, pulse 71, temperature 98 F (36.7 C), temperature source Oral, resp. rate (!) 31, height '5\' 6"'$  (1.676 m), weight 93.4 kg, SpO2 100 %.       No intake or output data in the 24 hours ending 10/02/22 0753 Filed Weights   10/01/22 1734  Weight: 93.4 kg    Examination: General: Chronically ill appearing elderly female laying in bed, appears fatigued HENT: [place]/AT Lungs: Diffuse crackles throughout lung fields, no focal findings, no accessory muscle use Cardiovascular: RRR, palpable radial pulses bilaterally Abdomen: Soft, non-tender, non-distended Extremities: Trace LE edema Neuro: A/ox3. No focal deficits  Resolved Hospital Problem list     Assessment & Plan:  Acute on chronic hypoxemic respiratory failure UIP Fibrosis Leukocytosis - IV solumedrol 80 mg daily while admitted  - continue ceftriaxone - continue home cellcept, perfenidone, singulair  - mucinex - procal, RVP, monitor sputum and blood culture  HTN HFpEF -prn lasix, monitor electrolytes and replace as needed  T2DM Hypothyroidism  GERD - SSI - levothyroxine 125 mcg daily - pantoprazole 40 mg daily  Anxiety Depression Insomnia - continue home xanax   Goals of Care - goal for this admission is medical optimization and discharge home - palliative consulted and will  continue with outpatient palliative as well - Continue with DNR, recommend reviewing MOST form   Best Practice (right click and "Reselect all SmartList Selections" daily)   Diet/type: Regular consistency (see orders) DVT prophylaxis: LMWH GI prophylaxis: PPI Lines: N/A Foley:  N/A Code Status:  DNR Last date of multidisciplinary goals of care discussion [3/14]  Labs   CBC: Recent Labs  Lab 10/01/22 1815 10/02/22 0545  WBC 10.6* 10.0  NEUTROABS 8.1*  --   HGB 9.9* 9.9*  HCT 32.7* 32.1*  MCV 99.7 99.4  PLT 402* [id number]    Basic Metabolic Panel: Recent Labs  Lab 10/01/22 1815 10/01/22 2019 10/02/22 0545  NA 138  --  137  K 2.4*  --  3.5  CL 85*  --  89*  CO2 39*  --  34*  GLUCOSE 110*  --  166*  BUN 8  --  8  CREATININE 0.52  --  0.50  CALCIUM 9.8  --  9.2  MG  --  1.3* 1.8   GFR: Estimated Creatinine Clearance: 74.2 mL/min (by C-G formula based on SCr of 0.5 mg/dL). Recent Labs  Lab 10/01/22 1805 10/01/22 1815 10/02/22 0545  WBC  --  10.6* 10.0  LATICACIDVEN 1.2  --   --     Liver Function Tests: No results for input(s): "AST", "ALT", "ALKPHOS", "BILITOT", "PROT", "ALBUMIN" in the last 168 hours. Recent Labs  Lab 10/01/22 1815  LIPASE 25   No results for input(s): "AMMONIA" in the last 168 hours.  ABG    Component Value Date/Time   PHART 7.375 06/30/2013 0500   PCO2ART 49.1 (H) 06/30/2013 0500   PO2ART 112.0 (H) 06/30/2013 0500   HCO3 28.0 (H) 06/30/2013 0500   TCO2 29.5 06/30/2013 0500   O2SAT 97.9 06/30/2013 0500     Coagulation Profile: No results for input(s): "INR", "PROTIME" in the last 168 hours.  Cardiac Enzymes: No results for input(s): "CKTOTAL", "CKMB", "CKMBINDEX", "TROPONINI" in the last 168 hours.  HbA1C: Hemoglobin A1C  Date/Time Value Ref Range Status  09/09/2022 11:09 AM 6.2 (A) 4.0 - 5.6 % Final  06/06/2022 10:24 AM 7.2 (A) 4.0 - 5.6 % Final   Hgb A1c MFr Bld  Date/Time Value Ref Range Status  02/27/2022 09:08 AM 6.9  (H) 4.6 - 6.5 % Final    Comment:    Glycemic Control Guidelines for People with Diabetes:Non Diabetic:  <6%Goal of Therapy: <7%Additional Action Suggested:  >8%   09/05/2021 11:35 AM 6.6 (H) 4.6 - 6.5 % Final    Comment:    Glycemic Control Guidelines for People with Diabetes:Non Diabetic:  <6%Goal of Therapy: <7%Additional Action Suggested:  >8%     CBG: Recent Labs  Lab 10/02/22 0123 10/02/22 0745  GLUCAP 208* 166*    Review of Systems:   See HPI.  Past Medical History:  She,  has a past medical history of Allergic rhinitis, Anesthesia complication, Anxiety, Bronchitis, Chicken pox, Constipation, Depression, Diverticulosis, Dysphagia, GERD (gastroesophageal reflux disease), H/O hiatal hernia, Hemorrhoids, History of bronchitis, History of colon polyps, History of kidney stones, Hyperlipidemia, Hypertension, Hypokalemia, Hypothyroidism (acquired), Insomnia, Joint swelling, Migraines, Non-insulin dependent type 2 diabetes mellitus ([place]), OA (osteoarthritis), Oxygen deficiency, Oxygen dependent, Pneumonia, PONV (postoperative nausea and vomiting), Shortness of  breath, and Urinary frequency.   Surgical History:   Past Surgical History:  Procedure Laterality Date   ABDOMINAL EXPLORATION SURGERY     For Ovarian Cyst    APPENDECTOMY     CHOLECYSTECTOMY     COLONOSCOPY  05/2013   TA, severe diverticulosis, rpt 3 yrs ([place])   ENTEROSCOPY N/A 08/12/2013   Procedure: ENTEROSCOPY;  Surgeon: Lafayette Dragon, MD;  Location: [facility];  Service: Endoscopy;  Laterality: N/A;   ESOPHAGOGASTRODUODENOSCOPY     left knee arthroscopy     LITHOTRIPSY     x 2   LUNG BIOPSY Right 06/29/2013   Procedure: LUNG BIOPSY;  Surgeon: Melrose Nakayama, MD;  Location: [facility];  Service: Thoracic;  Laterality: Right;   TCS     TUBAL LIGATION     VIDEO ASSISTED THORACOSCOPY Right 06/29/2013   Procedure: VIDEO ASSISTED THORACOSCOPY;  Surgeon: Melrose Nakayama, MD;  Location: [facility];  Service:  Thoracic;  Laterality: Right;     Social History:   reports that she has never smoked. She has been exposed to tobacco smoke. She has never used smokeless tobacco. She reports that she does not drink alcohol and does not use drugs.   Family History:  Her family history includes Autoimmune disease in her sister; Heart disease in her brother, maternal grandfather, and maternal grandmother; Prostate cancer in her brother; Rheum arthritis in her mother and sister; Uterine cancer in her daughter. There is no history of Colon cancer or Breast cancer.   Allergies Allergies  Allergen Reactions   Adhesive [Tape]     Redness, bruising with any adhesives- bandaids, patches, etc.    Atovaquone Itching   Codeine Hives and Swelling   Demerol [Meperidine] Hives and Swelling   Hydrocodone Nausea Only   Augmentin [Amoxicillin-Pot Clavulanate] Other (See Comments)    Upsets stomach   Azithromycin Nausea And Vomiting    Upset stomach   Trazodone And Nefazodone Itching   Sulfa Antibiotics Rash     Home Medications  Prior to Admission medications   Medication Sig Start Date End Date Taking? Authorizing Provider  acetaminophen (TYLENOL) 500 MG tablet Take 1,000 mg by mouth every 6 (six) hours as needed for moderate pain.   Yes [provider]  ALPRAZolam (XANAX) 0.25 MG tablet Take 1 tablet (0.25 mg total) by mouth 2 (two) times daily as needed for anxiety. 10/01/22  Yes Mannam, Praveen, MD  Ascorbic Acid (VITAMIN C PO) Take 1,000 mg by mouth daily.   Yes [provider]  baclofen (LIORESAL) 10 MG tablet Take 1 tablet (10 mg total) by mouth 3 (three) times daily as needed. Patient taking differently: Take 10 mg by mouth daily as needed for muscle spasms. 07/22/22  Yes Bedsole, Amy E, MD  fluticasone (FLONASE) 50 MCG/ACT nasal spray Place 1 spray into both nostrils daily as needed for rhinitis.   Yes [provider]  furosemide (LASIX) 20 MG tablet Take 1 tablet (20 mg total) by  mouth daily. Take one by mouth daily. Patient taking differently: Take 20 mg by mouth daily. 09/22/22  Yes Mannam, Praveen, MD  glipiZIDE (GLUCOTROL XL) 10 MG 24 hr tablet Take 1 tablet (10 mg total) by mouth daily with breakfast. 05/16/22  Yes Bedsole, Amy E, MD  hydrochlorothiazide (HYDRODIURIL) 12.5 MG tablet TAKE 1 TABLET DAILY ALONG WITH ONE OF THE LOSARTAN 50 MG TABLETS Patient taking differently: Take 12.5 mg by mouth daily. WITH ONE OF THE LOSARTAN 50 MG TABLETS 05/22/22  Yes Bedsole, Amy E, MD  ketoconazole (NIZORAL) 2 % cream Apply 1 Application topically daily. Patient taking differently: Apply 1 Application topically daily as needed for irritation. 09/09/22  Yes Bedsole, Amy E, MD  levothyroxine (SYNTHROID) 125 MCG tablet TAKE 1 TABLET BY MOUTH DAILY  BEFORE BREAKFAST Patient taking differently: Take 125 mcg by mouth daily before breakfast. 08/15/22  Yes Bedsole, Amy E, MD  losartan (COZAAR) 50 MG tablet TAKE 1 TABLET DAILY ALONG WITH 1 OF THE HCTZ 12.'5MG'$  TABLETS 08/25/22  Yes Bedsole, Amy E, MD  metFORMIN (GLUCOPHAGE-XR) 500 MG 24 hr tablet Take 4 tablets (2,000 mg total) by mouth daily with breakfast. Patient taking differently: Take 1,000 mg by mouth 2 (two) times daily with a meal. 06/06/22  Yes Bedsole, Amy E, MD  montelukast (SINGULAIR) 10 MG tablet Take 1 tablet (10 mg total) by mouth at bedtime. 08/18/22  Yes Bedsole, Amy E, MD  mycophenolate (CELLCEPT) 500 MG tablet Take '1500mg'$ (3 TABS) twice a day Patient taking differently: Take 1,500 mg by mouth 2 (two) times daily. Take 3 tablets by mouth twice a day 08/29/22  Yes Mannam, Praveen, MD  ondansetron (ZOFRAN) 4 MG tablet Take 1 tablet (4 mg total) by mouth every 8 (eight) hours as needed for nausea or vomiting. 08/08/22  Yes Bedsole, Amy E, MD  pantoprazole (PROTONIX) 40 MG tablet TAKE 1 TABLET BY MOUTH TWICE  DAILY Patient taking differently: Take 40 mg by mouth 2 (two) times daily. 08/15/22  Yes Bedsole, Amy E, MD  Pirfenidone 801 MG TABS  TAKE 1 TABLET ('801MG'$ ) BY MOUTH  THREE TIMES DAILY WITH FOOD Patient taking differently: Take 801 mg by mouth with breakfast, with lunch, and with evening meal. 08/15/22  Yes Mannam, Praveen, MD  predniSONE (DELTASONE) 5 MG tablet Take 1 tablet (5 mg total) by mouth daily with breakfast. Patient taking differently: Take 5 mg by mouth daily as needed (for difficulty breathing). 06/26/22  Yes Mannam, Praveen, MD  Probiotic Product (PROBIOTIC DAILY PO) Take 1 capsule by mouth daily.   Yes [provider]  rosuvastatin (CRESTOR) 10 MG tablet Take 1 tablet (10 mg total) by mouth daily. 10/30/21  Yes Bedsole, Amy E, MD  verapamil (CALAN-SR) 180 MG CR tablet TAKE 1 TABLET BY MOUTH DAILY Patient taking differently: Take 180 mg by mouth daily. 08/15/22  Yes Bedsole, Amy E, MD  glucose blood (ONETOUCH ULTRA) test strip CHECK BLOOD SUGAR DAILY. 06/09/22   Bedsole, Amy E, MD  mycophenolate (CELLCEPT) 250 MG capsule Take '1500mg'$  twice daily Patient taking differently: Take 1,500 mg by mouth 2 (two) times daily. 09/01/22   Mannam, Hart Robinsons, MD  NON FORMULARY Place 6-8 L into the nose daily. 10 Liters with exertion    [provider]  nystatin (MYCOSTATIN/NYSTOP) powder Apply 1 Application topically 3 (three) times daily. Patient not taking: Reported on 10/02/2022 07/15/22   [name], MD  OneTouch Delica Lancets [id number] MISC CHECK BLOOD SUGAR ONCE DAILY. 11/11/19   [name], MD     Critical care time: [place], DO Internal Medicine Resident, PGY-1 Pager# [phone]

## 2022-10-02 NOTE — Progress Notes (Signed)
Patient refused cpap at this time. 

## 2022-10-02 NOTE — Consult Note (Signed)
Consultation Note Date: 10/02/2022   Patient Name: Kaitlin Hamilton  DOB: 10/21/1950  MRN: [id number]  Age / Sex: 72 y.o., female  PCP: [name], MD Referring Physician: Annita Brod, MD  Reason for Consultation: Establishing goals of care  HPI/Patient Profile: 72 y.o. female  with past medical history of IP fibrosis, chronic hypoxemic respiratory failure on 9 L O2, OSA on CPAP, type 2 diabetes, hypertension, chronic HFpEF, anxiety, depression, insomnia, GERD, hypothyroidism admitted on 10/01/2022 with dyspnea, cough, fever.   Patient is admitted for acute on chronic hypoxic respiratory failure, likely due to progression of ILD/UIP fibrosis. PMT has been consulted to assist with goals of care conversation.  Clinical Assessment and Goals of Care:  I have reviewed medical records including EPIC notes, labs and imaging, assessed the patient and then met at the bedside with patient's daughter and friend to discuss diagnosis prognosis, [place], EOL wishes, disposition and options.  I introduced Palliative Medicine as specialized medical care for people living with serious illness. It focuses on providing relief from the symptoms and stress of a serious illness. The goal is to improve quality of life for both the patient and the family.  We discussed a brief life review of the patient and then focused on their current illness.  The natural disease trajectory and expectations at EOL were discussed.  I attempted to elicit values and goals of care important to the patient.    Medical History Review and Understanding:  Patient and her daughter report having a good understanding of her acute illness after discussion with her providers last night and today.  Patient openly discusses her poor long-term prognosis and is at peace with this.  Social History: Patient has 2 daughters, 1 of which who lives in [place].  She is  well supported by friends and church members as well.  She is a Engineer, manufacturing.  Functional and Nutritional State: Patient has had rapid decline over the past 2 weeks.  She can hardly ambulate at all without severe dyspnea.  Palliative Symptoms: Anxiety, dyspnea, fatigue  Code Status: Concepts specific to code status, artifical feeding and hydration, and rehospitalization were considered and discussed.  Patient confirms with me that she made the decision for DNR/DNI upon arrival to the ED.  She has been thinking about this for some time, but has not made the finalized decision until now.  Discussion: Patient is tearful when discussing her thoughts about her health and long-term prognosis.  She understands the irreversible nature of her pulmonary disease, though she is still coping with the tremendous change has this has caused in her day-to-day life lately.  Her independence is of the utmost importance to her and her goal is to live at home alone as long as she can.  She is not sure if she would consider temporary SNF placement for rehab, but would never find long-term care placement to be acceptable.  She has not yet thought about whether she will be able to continue to live at home safely after this acute illness.  She is not ready for end-of-life, even if she does not have much longer.  At this point, she would like to treat the treatable and improve her health is much as possible to return home and then continue goals of care discussions with outpatient palliative care.  We briefly discussed what it would look like to consider transition to comfort care in the inpatient setting if she were to decline further rather than improve during her  admission. A MOST form was introduced for review.   The difference between aggressive medical intervention and comfort care was considered in light of the patient's goals of care. Hospice and Palliative Care services outpatient were explained and offered.    Discussed the importance of continued conversation with family and the medical providers regarding overall plan of care and treatment options, ensuring decisions are within the context of the patient's values and GOCs.   Questions and concerns were addressed.  Hard Choices booklet left for review. The family was encouraged to call with questions or concerns.  PMT will continue to support holistically.   SUMMARY OF RECOMMENDATIONS   -Continue DNR/DNI -Continue to treat the treatable, no escalation or aggressive interventions if she declines further -Psychosocial and emotional support provided -Ongoing goals of care discussions pending clinical course -Patient is established with outpatient palliative care and would like to continue upon discharge -PMT will continue to follow and support  Prognosis:  Poor long-term prognosis given functional decline, several chronic comorbidities  Discharge Planning: To Be Determined      Primary Diagnoses: Present on Admission:  ILD (interstitial lung disease) ([place])  Acute on chronic respiratory failure with hypoxemia (HCC)  Hypokalemia  Hypomagnesemia  QT prolongation  Normocytic anemia  OSA (obstructive sleep apnea)  HTN (hypertension)  Chronic heart failure with preserved ejection fraction (HFpEF) (HCC)  Hypothyroidism  Obesity (BMI 30-39.9)   Physical Exam Vitals and nursing note reviewed.  Constitutional:      General: She is not in acute distress.    Appearance: She is ill-appearing.     Interventions: Nasal cannula in place.  Cardiovascular:     Rate and Rhythm: Normal rate.  Pulmonary:     Effort: Pulmonary effort is normal. Tachypnea present.  Neurological:     Mental Status: She is alert and oriented to person, place, and time.  Psychiatric:        Behavior: Behavior normal.    Vital Signs: BP 125/76   Pulse 86   Temp 98.1 F (36.7 C) (Oral)   Resp (!) 21   Ht 5\' 6"  (1.676 m)   Wt 93.4 kg   SpO2 97%   BMI 33.25  kg/m  Pain Scale: 0-10   Pain Score: 0-No pain   SpO2: SpO2: 97 % O2 Device:SpO2: 97 % O2 Flow Rate: .O2 Flow Rate (L/min): 10 L/min   Palliative Assessment/Data: 50%     MDM: High   Martinez Boxx Johnnette Litter, PA-C  Palliative Medicine Team Team phone # [phone]  Thank you for allowing the Palliative Medicine Team to assist in the care of this patient. Please utilize secure chat with additional questions, if there is no response within 30 minutes please call the above phone number.  Palliative Medicine Team providers are available by phone from 7am to 7pm daily and can be reached through the team cell phone.  Should this patient require assistance outside of these hours, please call the patient's attending physician.

## 2022-10-02 NOTE — Telephone Encounter (Signed)
Telephone call to patients daughter to schedule in person visit for patient. LVM awaiting return call.

## 2022-10-02 NOTE — Assessment & Plan Note (Addendum)
Calculated BMI is 33,2 consistent with obesity class 1.  OSA.

## 2022-10-02 NOTE — Telephone Encounter (Signed)
[facility] got admitted [facility]

## 2022-10-02 NOTE — Assessment & Plan Note (Addendum)
Acute on chronic hypoxemic respiratory failure.   Patient with progressive disease and worsening respiratory failure.   Very limited mobility due to dyspnea.   She is currently on Cellcept, steroids, and Verapamil.  Discussed with pulmonary, recommendations to continue with  p.o. prednisone 60 mg and decrease by 10 mg every week.  Patient has outpatient appointment with pulmonary already scheduled on 3/29.  Patient has completed 5 days of antibiotic therapy with ceftriaxone IV.  Continue with bronchodilator therapy and inhaled steroids.  Continue with mycophenolate and pirfenidone.  Airway clearing techniques with flutter valve and incentive spirometer.

## 2022-10-03 DIAGNOSIS — I5032 Chronic diastolic (congestive) heart failure: Secondary | ICD-10-CM | POA: Diagnosis not present

## 2022-10-03 DIAGNOSIS — Z515 Encounter for palliative care: Secondary | ICD-10-CM | POA: Diagnosis not present

## 2022-10-03 DIAGNOSIS — E874 Mixed disorder of acid-base balance: Secondary | ICD-10-CM | POA: Diagnosis not present

## 2022-10-03 DIAGNOSIS — J849 Interstitial pulmonary disease, unspecified: Secondary | ICD-10-CM | POA: Diagnosis not present

## 2022-10-03 DIAGNOSIS — J9621 Acute and chronic respiratory failure with hypoxia: Secondary | ICD-10-CM | POA: Diagnosis not present

## 2022-10-03 DIAGNOSIS — F419 Anxiety disorder, unspecified: Secondary | ICD-10-CM | POA: Diagnosis present

## 2022-10-03 DIAGNOSIS — Z7189 Other specified counseling: Secondary | ICD-10-CM | POA: Diagnosis not present

## 2022-10-03 DIAGNOSIS — E876 Hypokalemia: Secondary | ICD-10-CM | POA: Diagnosis not present

## 2022-10-03 LAB — HEPATIC FUNCTION PANEL
ALT: 11 U/L (ref 0–44)
AST: 15 U/L (ref 15–41)
Albumin: 2.8 g/dL — ABNORMAL LOW (ref 3.5–5.0)
Alkaline Phosphatase: 51 U/L (ref 38–126)
Bilirubin, Direct: <0.1 mg/dL (ref 0.0–0.2)
Total Bilirubin: 0.6 mg/dL (ref 0.3–1.2)
Total Protein: 6 g/dL — ABNORMAL LOW (ref 6.5–8.1)

## 2022-10-03 LAB — BLOOD GAS, ARTERIAL
Acid-Base Excess: 21.9 mmol/L — ABNORMAL HIGH (ref 0.0–2.0)
Bicarbonate: 49.1 mmol/L — ABNORMAL HIGH (ref 20.0–28.0)
Drawn by: 33176
O2 Saturation: 98.3 %
Patient temperature: 36.9
pCO2 arterial: 63 mmHg — ABNORMAL HIGH (ref 32–48)
pH, Arterial: 7.5 — ABNORMAL HIGH (ref 7.35–7.45)
pO2, Arterial: 109 mmHg — ABNORMAL HIGH (ref 83–108)

## 2022-10-03 LAB — BASIC METABOLIC PANEL
Anion gap: 7 (ref 5–15)
BUN: 11 mg/dL (ref 8–23)
CO2: 41 mmol/L — ABNORMAL HIGH (ref 22–32)
Calcium: 9 mg/dL (ref 8.9–10.3)
Chloride: 91 mmol/L — ABNORMAL LOW (ref 98–111)
Creatinine, Ser: 0.81 mg/dL (ref 0.44–1.00)
GFR, Estimated: >60 mL/min (ref >60)
Glucose, Bld: 157 mg/dL — ABNORMAL HIGH (ref 70–99)
Potassium: 3.3 mmol/L — ABNORMAL LOW (ref 3.5–5.1)
Sodium: 139 mmol/L (ref 135–145)

## 2022-10-03 LAB — GLUCOSE, CAPILLARY
Glucose-Capillary: 148 mg/dL — ABNORMAL HIGH (ref 70–99)
Glucose-Capillary: 174 mg/dL — ABNORMAL HIGH (ref 70–99)
Glucose-Capillary: 284 mg/dL — ABNORMAL HIGH (ref 70–99)
Glucose-Capillary: 262 mg/dL — ABNORMAL HIGH (ref 70–99)

## 2022-10-03 LAB — MAGNESIUM: Magnesium: 1.6 mg/dL — ABNORMAL LOW (ref 1.7–2.4)

## 2022-10-03 MED ORDER — PIRFENIDONE 801 MG PO TABS
801.0000 mg | ORAL_TABLET | Freq: Three times a day (TID) | ORAL | Status: DC
  Administered 2022-10-03 – 2022-10-11 (×24): 801 mg via ORAL
  Filled 2022-10-03 (×26): qty 1

## 2022-10-03 MED ORDER — ALPRAZOLAM 0.25 MG PO TABS
0.2500 mg | ORAL_TABLET | Freq: Four times a day (QID) | ORAL | Status: DC | PRN
  Administered 2022-10-03 – 2022-10-04 (×3): 0.25 mg via ORAL
  Filled 2022-10-03 (×3): qty 1

## 2022-10-03 MED ORDER — MAGNESIUM SULFATE 4 GM/100ML IV SOLN
4.0000 g | Freq: Once | INTRAVENOUS | Status: AC
  Administered 2022-10-03: 4 g via INTRAVENOUS
  Filled 2022-10-03: qty 100

## 2022-10-03 MED ORDER — POTASSIUM CHLORIDE CRYS ER 20 MEQ PO TBCR
40.0000 meq | EXTENDED_RELEASE_TABLET | Freq: Once | ORAL | Status: AC
  Administered 2022-10-03: 40 meq via ORAL
  Filled 2022-10-03: qty 2

## 2022-10-03 MED ORDER — BACLOFEN 10 MG PO TABS
10.0000 mg | ORAL_TABLET | Freq: Two times a day (BID) | ORAL | Status: DC | PRN
  Administered 2022-10-03 – 2022-10-09 (×7): 10 mg via ORAL
  Filled 2022-10-03 (×7): qty 1

## 2022-10-03 NOTE — Progress Notes (Signed)
[facility] Progress Note  Patient: Kaitlin Hamilton    [id number]  DOA: 10/01/2022    Date of Service: the patient was seen and examined on 10/03/2022  Brief hospital course: 72 y.o. female with past medical history significant for UIP fibrosis, chronic hypoxemic respiratory failure on 9 L O2, OSA on CPAP, type 2 diabetes, hypertension, diastolic heart failure, and hypothyroidism.  As patient's condition has progressed, in the last few months, pulmonary has started discussions with patient about palliative care.  Patient had noted over the last few weeks, that even with mild exertion, she gets significantly short of breath and noted to be hypoxic.  She presented to the emergency room on 3/13 with complaints of the same.  Workup did not reveal any immediate acute findings such as heart failure or acute infection.  Patient not tachycardic or afebrile and oxygen saturations in the high 90s on her baseline of 9 L, however with any type of exertion, oxygen saturations dropped into the 60s.  Hospitalist were called in for further evaluation.  Palliative care consulted.     Assessment and Plan: * ILD (interstitial lung disease) ([place]) She is currently on Cellcept, steroids, and Verapamil  Acute on chronic respiratory failure with hypoxemia ([place]) I suspect this is more of a worsening of her underlying condition rather than an acute reversible cause.  Given her advanced degree of respiratory failure even before her worsening hypoxia, she would be very appropriate for palliative care, who have been consulted.    That said, patient does report orthostasis.  She had been started on Lasix several months ago, but had had it adjusted and changed to as needed due to issues with hypokalemia.  Given her need for prednisone leading to chronic swelling, chronic shortness of breath, it may be difficult for her to ascertain acute need for Lasix as needed.  Received 2 doses of IV Lasix and diuresed over a liter.   Patient states breathing is a bit more comfortable and daughter observes that with mild activity, she does not have significant oxygen desaturation.  Will have physical therapy check ambulatory pulse ox.  Metabolic alkalosis with respiratory acidosis After IV Lasix, patient has diuresed approximately 1 L.  Noted to have an increased bicarb level of 41.  ABG with pH 7.5, pCO2 of 63 and bicarb of 49.  Will not give any further Lasix for concerns of diuresis causing respiratory failure.  Fortunately, patient is feeling better.  Anxiety Breathing a bit easier, but still feels anxious.  Will increase as needed Xanax.  Hypomagnesemia Replacing as needed  Hypokalemia Replacing as needed.  Low on admission 2.6, probably from attempting to use Lasix at home.  Chronic heart failure with preserved ejection fraction (HFpEF) (HCC) Echocardiogram done in Oct of 2023 notes preserved ejection fraction and grade 1 diastolic dysfunction  HTN (hypertension) Blood pressure stable  Non-insulin dependent type 2 diabetes mellitus (HCC) CBG's stable, sliding scale  Normocytic anemia Secondary to her chronic disease.  Slightly lower than previous baseline, but no indications for acute bleed  Hypothyroidism Continue synthroid  Obesity (BMI 30-39.9) Meets criteria with BMI >30       Body mass index is 34.02 kg/m.        Consultants: Pulmonary Palliative care  Procedures: None  Antimicrobials: None  Code Status: DNR   Subjective: Breathing much easier.  Objective:  Vitals:   10/03/22 1200 10/03/22 1300  BP:    Pulse: 91 86  Resp:    Temp:  SpO2: 90% 100%    Intake/Output Summary (Last 24 hours) at 10/03/2022 1459 Last data filed at 10/03/2022 0800 Gross per 24 hour  Intake 697.74 ml  Output 650 ml  Net 47.74 ml   Filed Weights   10/01/22 1734 10/03/22 0600  Weight: 93.4 kg 95.6 kg   Body mass index is 34.02 kg/m.  Exam:  General: Alert and oriented x 3, less  fatigued HEENT: Normocephalic, atraumatic, mucous membranes are slightly dry Cardiovascular: Regular rate and rhythm, no ectopic beats Respiratory: Decreased breath sounds throughout, better inspiratory effort Abdomen: Soft, nontender, nondistended, positive bowel sounds Musculoskeletal: No clubbing or cyanosis, no pitting edema Skin: No skin breaks, tears or lesions Psychiatry: Appropriate, no evidence of psychoses Neurology: No focal deficits  Data Reviewed: BG with pH 7.5, pCO2 of 63 and bicarb of 49 potassium 3.3  Disposition:  Status is: Inpatient    Anticipated discharge date: 3/16  Remaining issues to be resolved so that patient can be discharged:  -Possible improvement in respiratory function -Evaluation and plan of care by palliative care -Evaluation by physical therapy   Family Communication: Daughter at bedside DVT Prophylaxis: enoxaparin (LOVENOX) injection 40 mg Start: 10/02/22 1400    Author: Annita Brod ,MD 10/03/2022 2:59 PM  To reach On-call, see care teams to locate the attending and reach out via [email or web address]. Between 7PM-7AM, please contact night-coverage If you still have difficulty reaching the attending provider, please page the [facility] (Director on Call) for [facility] on amion for assistance.

## 2022-10-03 NOTE — Assessment & Plan Note (Addendum)
Continue with as needed alprazolam for anxiety.

## 2022-10-03 NOTE — TOC Progression Note (Signed)
Transition of Care [facility]) - Progression Note    Patient Details  Name: Kaitlin Hamilton MRN: [id number] Date of Birth: [birth year]  [facility]) CM/SW Contact  Zenon Mayo, RN Phone Number: 10/03/2022, 12:16 PM  Clinical Narrative:    - from home, pulmonary fibrosis, liters, ,9 liters is baseline, she is requiring high oxygen.  TOC following.        Expected Discharge Plan and Services                                               Social Determinants of Health (SDOH) Interventions SDOH Screenings   Food Insecurity: No Food Insecurity (09/08/2022)  Housing: Low Risk  (09/08/2022)  Transportation Needs: No Transportation Needs (09/08/2022)  Utilities: Not At Risk (09/08/2022)  Alcohol Screen: Low Risk  (07/12/2019)  Depression (PHQ2-9): High Risk (09/09/2022)  Financial Resource Strain: Low Risk  (09/08/2022)  Physical Activity: Unknown (09/08/2022)  Social Connections: Unknown (09/08/2022)  Stress: No Stress Concern Present (09/08/2022)  Tobacco Use: Low Risk  (10/02/2022)    Readmission Risk Interventions     No data to display

## 2022-10-03 NOTE — Progress Notes (Signed)
This chaplain responded to PMT PA-Josseline's consult for creating/updating the Pt. Advance Directive: HCPOA and Living Will. The Pt. completed AD education and answered clarifying questions. The Pt. daughter-[name] is at the bedside.  The chaplain is present with the Pt., [name], notary and witnesses for the notarizing of the Pt. AD.  The Pt. named Estate manager/land agent as the healthcare agent. If the healthcare agent is unable or unwilling to serve in this role the Pt. next choice is Allayne Butcher.  The chaplain gave the Pt. the original AD along with two copies. The chaplain scanned the Pt. AD into her EMR.  The chaplain is available for F/U spiritual care as needed.  Chaplain Sallyanne Kuster [phone]

## 2022-10-03 NOTE — Progress Notes (Signed)
Daily Progress Note   Patient Name: Kaitlin Hamilton       Date: 10/03/2022 DOB: 14-Mar-1951  Age: 72 y.o. MRN#: [id number] Attending Physician: Annita Brod, MD Primary Care Physician: [name], MD Admit Date: 10/01/2022  Reason for Consultation/Follow-up: Establishing goals of care  Subjective: Medical records reviewed progress notes, labs, imaging. Patient assessed at the bedside. She is sitting in bedside chair and denies pain or distress. Her daughter Donella Stade is present visiting.  Created space and opportunity for patient and daughter's thoughts and feelings on her current illness. They share that they reviewed the MOST form after our discussion yesterday and patient is ready to fill it out. She indicated desired for comfort focused care moving forward. She does not want to return to the hospital again after returning home. Clarified that patient is still interested in outpatient palliative care and she would then consider transition to hospice if she still needs additional support or has worsening quality of life.  A detailed discussion regarding advanced directives was had. Provided education on HCPOA and Living Will then assisted with filling out documents and coordination of notary. We discussed the importance of naming individuals who would be able to honor her wishes above their own preferences or opinions.  Questions and concerns addressed. PMT will continue to support holistically.   Length of Stay: 1  Physical Exam Vitals and nursing note reviewed.  Constitutional:      General: She is not in acute distress.    Appearance: She is ill-appearing.  Cardiovascular:     Rate and Rhythm: Normal rate.  Pulmonary:     Effort: Pulmonary effort is normal. No respiratory distress.   Neurological:     Mental Status: She is alert. Mental status is at baseline.  Psychiatric:        Mood and Affect: Mood normal.        Behavior: Behavior normal.            Vital Signs: BP (!) 123/53 (BP Location: Left Arm)   Pulse 76   Temp 98.4 F (36.9 C) (Oral)   Resp 18   Ht 5\' 6"  (1.676 m)   Wt 95.6 kg   SpO2 99%   BMI 34.02 kg/m  SpO2: SpO2: 99 % O2 Device: O2 Device: Other (Comment) (oximizer) O2 Flow  Rate: O2 Flow Rate (L/min): 10 L/min      Palliative Assessment/Data: 50%   Palliative Care Assessment & Plan   Patient Profile: 72 y.o. female  with past medical history of IP fibrosis, chronic hypoxemic respiratory failure on 9 L O2, OSA on CPAP, type 2 diabetes, hypertension, chronic HFpEF, anxiety, depression, insomnia, GERD, hypothyroidism admitted on 10/01/2022 with dyspnea, cough, fever.    Patient is admitted for acute on chronic hypoxic respiratory failure, likely due to progression of ILD/UIP fibrosis. PMT has been consulted to assist with goals of care conversation.  Assessment: Goals of care conversation Acute on chronic hypoxic respiratory failure UIP fibrosis  Recommendations/Plan: Continue DNR/DNI Continue treating the treatable, no escalation or aggressive interventions if she declines further MOST form completed today. Provided patient and her daughter with copies and placed original on hard chart. Will scan copy into EMR Spiritual care consult for assistance with AD completion Psychosocial and emotional support provided Ongoing goals of care discussions pending clinical course Patient is established with outpatient palliative care and would like to continue upon discharge PMT will continue to follow and support   Prognosis:  Poor long-term prognosis given functional decline, several chronic comorbidities   Discharge Planning: Home with Palliative Services  Care plan was discussed with patient, patient's daughter   MDM high          [place], PA-C  Palliative Medicine Team Team phone # [phone]  Thank you for allowing the Palliative Medicine Team to assist in the care of this patient. Please utilize secure chat with additional questions, if there is no response within 30 minutes please call the above phone number.  Palliative Medicine Team providers are available by phone from 7am to 7pm daily and can be reached through the team cell phone.  Should this patient require assistance outside of these hours, please call the patient's attending physician.

## 2022-10-03 NOTE — Assessment & Plan Note (Deleted)
After IV Lasix, patient has diuresed approximately 1 L.  Noted to have an increased bicarb level of 41.  ABG with pH 7.5, pCO2 of 63 and bicarb of 49.  Held off on additional Lasix for concerns of diuresis causing respiratory failure.  Fortunately, patient is feeling better.  By following day, bicarb level has come down.  Started p.o. Lasix 3/17 every other day

## 2022-10-03 NOTE — Progress Notes (Signed)
Patient refusing cpap at this time, on home 02 therapy at this time

## 2022-10-03 NOTE — Progress Notes (Signed)
NAME:  Kaitlin Hamilton, MRN:  [id number], DOB:  [birth year], LOS: 1 ADMISSION DATE:  10/01/2022, CONSULTATION DATE:  10/02/2022 REFERRING MD:  Dr. [place] Pink, CHIEF COMPLAINT:  Acute on Chronic Hypoxemic Respiratory Failure   History of Present Illness:  72 y/o female with PMH of UIP fibrosis with chronic hypoxemic respiratory failure on 9 L home O2 with oxymizer, T2DM, HTN, HFpEF, GERD, hypothyroidism, GAD, depression, and insomnia presenting with progressive dyspnea with increased oxygen requirements and associated productive cough and fevers over the past 2 weeks. She is followed in the pulmonology clinic for her UIP fibrosis.  She is currently on CellCept, pirfenidone, and prednisone 5-10 mg daily as well as had 1 rituximab infusion on 09/16/2022. She also takes zyrtec and Singulair but no inhalers. Since infusion she noticed increased congestion, postnasal drip, productive cough with green sputum, and intermittent fevers.  She was treated with a course of azithromycin on 09/23/2022.  She has had some improvement with her cough getting a little better in her sputum going from dark green to light or green.  She does have much more shortness of breath with needing extra supplemental oxygen from her 9 L when she is not at rest. She last took a dose of lasix 20 mg on 3/12 PM.   In the ED she was afebrile, hemodynamically stable, with SpO2 >90 on 9L O2 through oxymixer. Slight leukocytosis at 10.3, CXR with diffuse fibrosis but no focal findings, high res chest CT showed stable moderate pulmonary fibrosis. She also had a potassium of 2.4, Mg of 1.3, bicarb of 39, normal lactic acid, no troponin elevation. She was given a dose of ceftriaxone, solu-medrol 40 mg, and IV Mg/K.  Admitted to hospitalist.  Goals of care discussed with patient and daughter. They were planing on meeting with palliative care outpatient in the near future. Patient fully understands her progressive lung disease and the likely trajectory. She is  consistent and fully understands DNR status. Understands hospice in the context of palliative care as well as comfort care. Goal of this admission is to optimize medically and discharge home. On board with palliative consult here for further discussions.   Pertinent  Medical History  UIP Fibrosis Chronic Hypoxemic Respiratory Failure on 9L O2 at home T2DM HTN HFpEF GERD Hypothyroidism GAD/Depression Insomnia  Significant Hospital Events: Including procedures, antibiotic start and stop dates in addition to other pertinent events   3/13 IV steroids and ctx  Interim History / Subjective:  Sitting up in chair reports feeling better  Objective   Blood pressure (!) 123/53, pulse 76, temperature 98.4 F (36.9 C), temperature source Oral, resp. rate 18, height 5\' 6"  (1.676 m), weight 95.6 kg, SpO2 99 %.        Intake/Output Summary (Last 24 hours) at 10/03/2022 0853 Last data filed at 10/02/2022 2010 Gross per 24 hour  Intake 337.74 ml  Output 650 ml  Net -312.26 ml   Filed Weights   10/01/22 1734 10/03/22 0600  Weight: 93.4 kg 95.6 kg    Examination: General: Obese ill-appearing female HEENT: No JVD is noted Neuro: Grossly intact CV: Heart sounds are distant PULM: Decreased air movement Currently on 10 L nasal cannula sats 94% GI: soft, bsx4 active  GU: Extremities: warm/dry, 1+ edema  Skin: no rashes or lesions Recent Labs  Lab 10/01/22 1815 10/02/22 0545 10/03/22 0046  NA 138 137 139  K 2.4* 3.5 3.3*  CL 85* 89* 91*  CO2 39* 34* 41*  BUN 8 8 11  CREATININE 0.52 0.50 0.81  GLUCOSE 110* 166* 157*   Recent Labs  Lab 10/01/22 1815 10/02/22 0545  HGB 9.9* 9.9*  HCT 32.7* 32.1*  WBC 10.6* 10.0  PLT 402* 351  No chest x-ray   Resolved Hospital Problem list     Assessment & Plan:  Acute on chronic hypoxemic respiratory failure UIP Fibrosis Leukocytosis  IV steroids and taper IV antibiotics Continue home CellCept, perfenidone, singulair  Monitor  microbiology data      HTN HFpEF Per primary  T2DM Hypothyroidism GERD Per primary  Anxiety Depression Insomnia Continue Xanax  Goals of Care Currently DNR Maximize with discharge home Palliative care is involved  Best Practice (right click and "Reselect all SmartList Selections" daily)   Diet/type: Regular consistency (see orders) DVT prophylaxis: LMWH GI prophylaxis: PPI Lines: N/A Foley:  N/A Code Status:  DNR Last date of multidisciplinary goals of care discussion [3/14]  Labs   CBC: Recent Labs  Lab 10/01/22 1815 10/02/22 0545  WBC 10.6* 10.0  NEUTROABS 8.1*  --   HGB 9.9* 9.9*  HCT 32.7* 32.1*  MCV 99.7 99.4  PLT 402* [id number]    Basic Metabolic Panel: Recent Labs  Lab 10/01/22 1815 10/01/22 2019 10/02/22 0545 10/03/22 0046  NA 138  --  137 139  K 2.4*  --  3.5 3.3*  CL 85*  --  89* 91*  CO2 39*  --  34* 41*  GLUCOSE 110*  --  166* 157*  BUN 8  --  8 11  CREATININE 0.52  --  0.50 0.81  CALCIUM 9.8  --  9.2 9.0  MG  --  1.3* 1.8 1.6*   GFR: Estimated Creatinine Clearance: 74.2 mL/min (by C-G formula based on SCr of 0.81 mg/dL). Recent Labs  Lab 10/01/22 1805 10/01/22 1815 10/02/22 0545 10/02/22 1721  PROCALCITON  --   --   --  <0.10  WBC  --  10.6* 10.0  --   LATICACIDVEN 1.2  --   --   --     Liver Function Tests: No results for input(s): "AST", "ALT", "ALKPHOS", "BILITOT", "PROT", "ALBUMIN" in the last 168 hours. Recent Labs  Lab 10/01/22 1815  LIPASE 25   No results for input(s): "AMMONIA" in the last 168 hours.  ABG    Component Value Date/Time   PHART 7.375 06/30/2013 0500   PCO2ART 49.1 (H) 06/30/2013 0500   PO2ART 112.0 (H) 06/30/2013 0500   HCO3 28.0 (H) 06/30/2013 0500   TCO2 29.5 06/30/2013 0500   O2SAT 97.9 06/30/2013 0500     Coagulation Profile: No results for input(s): "INR", "PROTIME" in the last 168 hours.  Cardiac Enzymes: No results for input(s): "CKTOTAL", "CKMB", "CKMBINDEX", "TROPONINI" in the  last 168 hours.  HbA1C: Hemoglobin A1C  Date/Time Value Ref Range Status  09/09/2022 11:09 AM 6.2 (A) 4.0 - 5.6 % Final  06/06/2022 10:24 AM 7.2 (A) 4.0 - 5.6 % Final   Hgb A1c MFr Bld  Date/Time Value Ref Range Status  02/27/2022 09:08 AM 6.9 (H) 4.6 - 6.5 % Final    Comment:    Glycemic Control Guidelines for People with Diabetes:Non Diabetic:  <6%Goal of Therapy: <7%Additional Action Suggested:  >8%   09/05/2021 11:35 AM 6.6 (H) 4.6 - 6.5 % Final    Comment:    Glycemic Control Guidelines for People with Diabetes:Non Diabetic:  <6%Goal of Therapy: <7%Additional Action Suggested:  >8%     CBG: Recent Labs  Lab 10/02/22 0745 10/02/22 1231 10/02/22  1725 10/02/22 2205 10/03/22 [place] ACNP Acute Care Nurse Practitioner [place] Please consult Amion 10/03/2022, 8:54 AM

## 2022-10-04 DIAGNOSIS — E876 Hypokalemia: Secondary | ICD-10-CM | POA: Diagnosis not present

## 2022-10-04 DIAGNOSIS — I5032 Chronic diastolic (congestive) heart failure: Secondary | ICD-10-CM | POA: Diagnosis not present

## 2022-10-04 DIAGNOSIS — J849 Interstitial pulmonary disease, unspecified: Secondary | ICD-10-CM | POA: Diagnosis not present

## 2022-10-04 DIAGNOSIS — J9621 Acute and chronic respiratory failure with hypoxia: Secondary | ICD-10-CM | POA: Diagnosis not present

## 2022-10-04 LAB — GLUCOSE, CAPILLARY
Glucose-Capillary: 141 mg/dL — ABNORMAL HIGH (ref 70–99)
Glucose-Capillary: 200 mg/dL — ABNORMAL HIGH (ref 70–99)
Glucose-Capillary: 192 mg/dL — ABNORMAL HIGH (ref 70–99)
Glucose-Capillary: 347 mg/dL — ABNORMAL HIGH (ref 70–99)
Glucose-Capillary: 317 mg/dL — ABNORMAL HIGH (ref 70–99)

## 2022-10-04 LAB — BASIC METABOLIC PANEL
Anion gap: 7 (ref 5–15)
BUN: 14 mg/dL (ref 8–23)
CO2: 37 mmol/L — ABNORMAL HIGH (ref 22–32)
Calcium: 9 mg/dL (ref 8.9–10.3)
Chloride: 94 mmol/L — ABNORMAL LOW (ref 98–111)
Creatinine, Ser: 0.52 mg/dL (ref 0.44–1.00)
GFR, Estimated: >60 mL/min (ref >60)
Glucose, Bld: 221 mg/dL — ABNORMAL HIGH (ref 70–99)
Potassium: 4.1 mmol/L (ref 3.5–5.1)
Sodium: 138 mmol/L (ref 135–145)

## 2022-10-04 LAB — CULTURE, RESPIRATORY W GRAM STAIN: Culture: NORMAL

## 2022-10-04 MED ORDER — FUROSEMIDE 20 MG PO TABS
20.0000 mg | ORAL_TABLET | ORAL | Status: DC
  Administered 2022-10-05 – 2022-10-07 (×2): 20 mg via ORAL
  Filled 2022-10-04 (×3): qty 1

## 2022-10-04 MED ORDER — ALPRAZOLAM 0.25 MG PO TABS
0.2500 mg | ORAL_TABLET | Freq: Four times a day (QID) | ORAL | Status: DC | PRN
  Administered 2022-10-04 – 2022-10-06 (×6): 0.25 mg via ORAL
  Filled 2022-10-04 (×6): qty 1

## 2022-10-04 MED ORDER — GUAIFENESIN ER 600 MG PO TB12
600.0000 mg | ORAL_TABLET | Freq: Every day | ORAL | Status: DC
  Administered 2022-10-05 – 2022-10-11 (×7): 600 mg via ORAL
  Filled 2022-10-04 (×7): qty 1

## 2022-10-04 NOTE — Plan of Care (Signed)

## 2022-10-04 NOTE — Progress Notes (Signed)
Patient refusing cpap.

## 2022-10-04 NOTE — Progress Notes (Signed)
Refusing cpap at this time

## 2022-10-04 NOTE — Evaluation (Signed)
Physical Therapy Evaluation Patient Details Name: Kaitlin Hamilton MRN: [id number] DOB: 07/22/1950 Today's Date: 10/04/2022  History of Present Illness  72 y.o. female presents to [facility] hospital on 10/01/2022 with DOE and hypoxia. With any exertion pt sats dropping to 60s while on her baseline 9L [place]. PMH includes ILD, chronic respiratory failure with hypoxemia, DMII, HTN, diastolic HF.  Clinical Impression  Pt presents to PT with deficits in activity tolerance, strength, power, pulmonary function. Pt desaturates even on 10L [place] when ambulating for short distances, dropping to low 60s with 40' walk and 70s with 15' walk. PT provides education on the need for continued deep breathing and the potential need for more frequent breaks and shorter bouts of mobility to reduce the potential for significant desaturation. Pt does appear to have a good understanding of her lung disease and symptom management at this time. PT will continue to follow for further mobility training and energy conservation work. PT provides suggestions to improve energy conservation, including use of a BSC and placing chairs in convenient spots in the home to allow for breaks when mobilizing. PT recommends discharge home with HHPT and continued support from friends/family.     Recommendations for follow up therapy are one component of a multi-disciplinary discharge planning process, led by the attending physician.  Recommendations may be updated based on patient status, additional functional criteria and insurance authorization.  Follow Up Recommendations Home health PT      Assistance Recommended at Discharge Intermittent Supervision/Assistance  Patient can return home with the following  A little help with bathing/dressing/bathroom;Assistance with cooking/housework;Assist for transportation;Help with stairs or ramp for entrance    Equipment Recommendations Rolling walker (2 wheels);BSC/3in1  Recommendations for Other Services        Functional Status Assessment Patient has had a recent decline in their functional status and demonstrates the ability to make significant improvements in function in a reasonable and predictable amount of time.     Precautions / Restrictions Precautions Precautions: Fall Precaution Comments: monitor sats Restrictions Weight Bearing Restrictions: No      Mobility  Bed Mobility               General bed mobility comments: received and left in recliner    Transfers Overall transfer level: Needs assistance Equipment used: Rolling walker (2 wheels) Transfers: Sit to/from Stand Sit to Stand: Min guard                Ambulation/Gait Ambulation/Gait assistance: Min guard Gait Distance (Feet): 40 Feet (additional trial of 15') Assistive device: Rolling walker (2 wheels) Gait Pattern/deviations: Step-through pattern, Wide base of support Gait velocity: reduced Gait velocity interpretation: <1.31 ft/sec, indicative of household ambulator   General Gait Details: slowed step-through gait, multiple brief standing rest breaks to perform pursed lip breathing  Stairs            Wheelchair Mobility    Modified Rankin (Stroke Patients Only)       Balance Overall balance assessment: Needs assistance Sitting-balance support: No upper extremity supported, Feet supported Sitting balance-Leahy Scale: Good     Standing balance support: Bilateral upper extremity supported, Reliant on assistive device for balance Standing balance-Leahy Scale: Poor                               Pertinent Vitals/Pain Pain Assessment Pain Assessment: No/denies pain    Home Living Family/patient expects to be discharged to:: Private residence Living  Arrangements: Alone Available Help at Discharge: Family;Available PRN/intermittently (near 24/7, very supportive family/friends) Type of Home: Apartment Home Access: Stairs to enter Entrance Stairs-Rails:  Right;Left Entrance Stairs-Number of Steps: 6   Home Layout: One level Home Equipment: Rollator (4 wheels);Shower seat;Other (comment) (lift chair)      Prior Function Prior Level of Function : Needs assist             Mobility Comments: ambulates for short household distances with fatigue without DME ADLs Comments: requires assistance for IADLs and bathing     Hand Dominance        Extremity/Trunk Assessment   Upper Extremity Assessment Upper Extremity Assessment: Generalized weakness    Lower Extremity Assessment Lower Extremity Assessment: Generalized weakness    Cervical / Trunk Assessment Cervical / Trunk Assessment: Other exceptions Cervical / Trunk Exceptions: excess body habitus  Communication   Communication: No difficulties  Cognition Arousal/Alertness: Awake/alert Behavior During Therapy: WFL for tasks assessed/performed Overall Cognitive Status: Within Functional Limits for tasks assessed                                          General Comments General comments (skin integrity, edema, etc.): pt on 9L oximizer at rest, increased to 10L with mobility. Pt desats into 70s when ambulating to bathroom, taking multiple minutes to recover but doing so without extra supplemental oxygen. With 40' ambulation pt desats to low 60s, again taking multiple minutes to recover on 10 L.    Exercises     Assessment/Plan    PT Assessment Patient needs continued PT services  PT Problem List Decreased activity tolerance;Cardiopulmonary status limiting activity;Decreased balance;Decreased strength       PT Treatment Interventions DME instruction;Gait training;Therapeutic activities;Therapeutic exercise;Balance training;Patient/family education;Functional mobility training;Stair training    PT Goals (Current goals can be found in the Care Plan section)  Acute Rehab PT Goals Patient Stated Goal: to improve activity tolerance and energy conservation  techniques PT Goal Formulation: With patient Time For Goal Achievement: 10/18/22 Potential to Achieve Goals: Fair Additional Goals Additional Goal #1: Pt will report 2/4 DOE when ambulating for 50' or greater to demonstrate improved activity tolerance    Frequency Min 2X/week     Co-evaluation               AM-PAC PT "6 Clicks" Mobility  Outcome Measure Help needed turning from your back to your side while in a flat bed without using bedrails?: A Lot Help needed moving from lying on your back to sitting on the side of a flat bed without using bedrails?: A Lot Help needed moving to and from a bed to a chair (including a wheelchair)?: A Little Help needed standing up from a chair using your arms (e.g., wheelchair or bedside chair)?: A Little Help needed to walk in hospital room?: A Little Help needed climbing 3-5 steps with a railing? : A Lot 6 Click Score: 15    End of Session Equipment Utilized During Treatment: Oxygen Activity Tolerance: Patient limited by fatigue Patient left: in chair;with call bell/phone within reach;with family/visitor present Nurse Communication: Mobility status PT Visit Diagnosis: Other abnormalities of gait and mobility (R26.89)    Time: [id number] PT Time Calculation (min) (ACUTE ONLY): 32 min   Charges:   PT Evaluation $PT Eval Low Complexity: 1 Low          Zenaida Niece, PT,  DPT Acute Rehabilitation Office [place] 10/04/2022, 3:18 PM

## 2022-10-04 NOTE — Progress Notes (Signed)
[facility] Progress Note  Patient: Kaitlin Hamilton    [id number]  DOA: 10/01/2022    Date of Service: the patient was seen and examined on 10/04/2022  Brief hospital course: 72 y.o. female with past medical history significant for UIP fibrosis, chronic hypoxemic respiratory failure on 9 L O2, OSA on CPAP, type 2 diabetes, hypertension, diastolic heart failure, and hypothyroidism.  As patient's condition has progressed, in the last few months, pulmonary has started discussions with patient about palliative care.  Patient had noted over the last few weeks, that even with mild exertion, she gets significantly short of breath and noted to be hypoxic.  She presented to the emergency room on 3/13 with complaints of the same.  Workup did not reveal any immediate acute findings such as heart failure or acute infection.  Patient not tachycardic or afebrile and oxygen saturations in the high 90s on her baseline of 9 L, however with any type of exertion, oxygen saturations dropped into the 60s.  Hospitalist were called in for further evaluation.  Palliative care consulted.     Assessment and Plan: * ILD (interstitial lung disease) ([place]) She is currently on Cellcept, steroids, and Verapamil  Acute on chronic respiratory failure with hypoxemia ([place]) I suspect this is more of a worsening of her underlying condition rather than an acute reversible cause.  Given her advanced degree of respiratory failure even before her worsening hypoxia, she would be very appropriate for palliative care, who have been consulted.    That said, patient does report orthostasis.  She had been started on Lasix several months ago, but had had it adjusted and changed to as needed due to issues with hypokalemia.  Given her need for prednisone leading to chronic swelling, chronic shortness of breath, it may be difficult for her to ascertain acute need for Lasix as needed.  Received 2 doses of IV Lasix and diuresed over a liter.   Patient states breathing is a bit more comfortable and daughter observes that with mild activity, she does not have significant oxygen desaturation.  Will have physical therapy check ambulatory pulse ox.  Set up home health PT/OT plus equipment.  Metabolic alkalosis with respiratory acidosis After IV Lasix, patient has diuresed approximately 1 L.  Noted to have an increased bicarb level of 41.  ABG with pH 7.5, pCO2 of 63 and bicarb of 49.  Held off on additional Lasix for concerns of diuresis causing respiratory failure.  Fortunately, patient is feeling better.  By following day, bicarb level has come down.  Will plan to start p.o. Lasix every other day starting tomorrow.  Anxiety Breathing a bit easier, but still feels anxious.  Will increase as needed Xanax.  In discussion with patient and her daughter, patient admits that sometimes after her morning dose, she will hesitate to take 1 in the afternoon, because she knows that she will need 1 at night.  Will plan to increase to 4 times a day as needed, but also, encourage patient to take her Xanax especially in the morning before she has a heavily exertioning event knowing that that will cause her to feel more anxious.  We also discussed additional strategies to help with her anxiety.  Patient likes to wear continuous pulse ox and constantly frets when her oxygen saturations go down with exertion.  After some discussion, she is willing to try putting away her pulse ox and only checking it if her breathing becomes worse and does not improve after several minutes.  We also discussed calming down and breathing exercises.  Hypomagnesemia Replacing as needed  Hypokalemia Replacing as needed.  Low on admission 2.6, probably from attempting to use Lasix at home.  Chronic heart failure with preserved ejection fraction (HFpEF) (HCC) Echocardiogram done in Oct of 2023 notes preserved ejection fraction and grade 1 diastolic dysfunction  HTN  (hypertension) Blood pressure stable  Non-insulin dependent type 2 diabetes mellitus (HCC) CBG's stable, sliding scale  Normocytic anemia Secondary to her chronic disease.  Slightly lower than previous baseline, but no indications for acute bleed  Hypothyroidism Continue synthroid  Obesity (BMI 30-39.9) Meets criteria with BMI >30       Body mass index is 34.09 kg/m.        Consultants: Pulmonary Palliative care  Procedures: None  Antimicrobials: None  Code Status: DNR   Subjective: Breathing much easier.  Still feels anxious.   Objective:  Vitals:   10/04/22 0841 10/04/22 0950  BP: 117/60 126/67  Pulse:  80  Resp: 18 17  Temp: 98.4 F (36.9 C) 98.4 F (36.9 C)  SpO2: 100% 100%    Intake/Output Summary (Last 24 hours) at 10/04/2022 1136 Last data filed at 10/03/2022 1942 Gross per 24 hour  Intake 360.36 ml  Output 1000 ml  Net -639.64 ml   Filed Weights   10/01/22 1734 10/03/22 0600 10/04/22 0020  Weight: 93.4 kg 95.6 kg 95.8 kg   Body mass index is 34.09 kg/m.  Exam:  General: Alert and oriented x 3, less fatigued HEENT: Normocephalic, atraumatic, mucous membranes are slightly dry Cardiovascular: Regular rate and rhythm, no ectopic beats Respiratory: Decreased breath sounds throughout, better inspiratory effort Abdomen: Soft, nontender, nondistended, positive bowel sounds Musculoskeletal: No clubbing or cyanosis, no pitting edema Skin: No skin breaks, tears or lesions Psychiatry: Appropriate, no evidence of psychoses, mildly anxious Neurology: No focal deficits  Data Reviewed: Bicarb down to 37, CBG of 220s  Disposition:  Status is: Inpatient    Anticipated discharge date: 3/16  Remaining issues to be resolved so that patient can be discharged:  -Starting p.o. Lasix -Evaluation and plan of care by palliative care -Evaluation by physical therapy   Family Communication: Daughter at bedside DVT Prophylaxis: enoxaparin  (LOVENOX) injection 40 mg Start: 10/02/22 1400    Author: Annita Brod ,MD 10/04/2022 11:36 AM  To reach On-call, see care teams to locate the attending and reach out via [email or web address]. Between 7PM-7AM, please contact night-coverage If you still have difficulty reaching the attending provider, please page the [facility] (Director on Call) for [facility] on amion for assistance.

## 2022-10-04 NOTE — Progress Notes (Signed)
The patient pirfenidone home medication bottle sent to pharmacy per Edison Nasuti.

## 2022-10-05 DIAGNOSIS — J849 Interstitial pulmonary disease, unspecified: Secondary | ICD-10-CM | POA: Diagnosis not present

## 2022-10-05 DIAGNOSIS — J9621 Acute and chronic respiratory failure with hypoxia: Secondary | ICD-10-CM | POA: Diagnosis not present

## 2022-10-05 DIAGNOSIS — E876 Hypokalemia: Secondary | ICD-10-CM | POA: Diagnosis not present

## 2022-10-05 DIAGNOSIS — I5032 Chronic diastolic (congestive) heart failure: Secondary | ICD-10-CM | POA: Diagnosis not present

## 2022-10-05 LAB — BASIC METABOLIC PANEL
Anion gap: 10 (ref 5–15)
BUN: 15 mg/dL (ref 8–23)
CO2: 33 mmol/L — ABNORMAL HIGH (ref 22–32)
Calcium: 9.4 mg/dL (ref 8.9–10.3)
Chloride: 95 mmol/L — ABNORMAL LOW (ref 98–111)
Creatinine, Ser: 0.54 mg/dL (ref 0.44–1.00)
GFR, Estimated: >60 mL/min (ref >60)
Glucose, Bld: 224 mg/dL — ABNORMAL HIGH (ref 70–99)
Potassium: 4.2 mmol/L (ref 3.5–5.1)
Sodium: 138 mmol/L (ref 135–145)

## 2022-10-05 LAB — GLUCOSE, CAPILLARY
Glucose-Capillary: 145 mg/dL — ABNORMAL HIGH (ref 70–99)
Glucose-Capillary: 218 mg/dL — ABNORMAL HIGH (ref 70–99)
Glucose-Capillary: 304 mg/dL — ABNORMAL HIGH (ref 70–99)
Glucose-Capillary: 314 mg/dL — ABNORMAL HIGH (ref 70–99)

## 2022-10-05 NOTE — Progress Notes (Addendum)
[facility] Progress Note  Patient: Kaitlin Hamilton    [id number]  DOA: 10/01/2022    Date of Service: the patient was seen and examined on 10/05/2022  Brief hospital course: 72 y.o. female with past medical history significant for UIP fibrosis, chronic hypoxemic respiratory failure on 9 L O2, OSA on CPAP, type 2 diabetes, hypertension, diastolic heart failure, and hypothyroidism.  As patient's condition has progressed, in the last few months, pulmonary has started discussions with patient about palliative care.  Patient had noted over the last few weeks, that even with mild exertion, she gets significantly short of breath and noted to be hypoxic.  She presented to the emergency room on 3/13 with complaints of the same.  Workup did not reveal any immediate acute findings such as heart failure or acute infection.  Patient not tachycardic or afebrile and oxygen saturations in the high 90s on her baseline of 9 L, however with any type of exertion, oxygen saturations dropped into the 60s.  Hospitalist were called in for further evaluation.  Palliative care consulted.  While patient has had some improvement, still with significant oxygen desaturation with ambulation.  Evaluating for potential skilled nursing.  Have started p.o. Lasix   Assessment and Plan: * ILD (interstitial lung disease) ([place]) She is currently on Cellcept, steroids, and Verapamil  Acute on chronic respiratory failure with hypoxemia ([place]) I suspect this is more of a worsening of her underlying condition rather than an acute reversible cause.  Given her advanced degree of respiratory failure even before her worsening hypoxia, she would be very appropriate for palliative care, who have been consulted.    That said, patient does report orthostasis.  She had been started on Lasix several months ago, but had had it adjusted and changed to as needed due to issues with hypokalemia.  Given her need for prednisone leading to chronic  swelling, chronic shortness of breath, it may be difficult for her to ascertain acute need for Lasix as needed.  Received 2 doses of IV Lasix and diuresed over a liter.  Patient states breathing is a bit more comfortable and daughter observes that with mild activity, she does not have significant oxygen desaturation.  Will have physical therapy check ambulatory pulse ox.  Set up home health PT/OT plus equipment.  Metabolic alkalosis with respiratory acidosis After IV Lasix, patient has diuresed approximately 1 L.  Noted to have an increased bicarb level of 41.  ABG with pH 7.5, pCO2 of 63 and bicarb of 49.  Held off on additional Lasix for concerns of diuresis causing respiratory failure.  Fortunately, patient is feeling better.  By following day, bicarb level has come down.  Started p.o. Lasix 3/17 every other day  Anxiety Breathing a bit easier, but still feels anxious.  Will increase as needed Xanax.  In discussion with patient and her daughter, patient admits that sometimes after her morning dose, she will hesitate to take 1 in the afternoon, because she knows that she will need 1 at night.  Will plan to increase to 4 times a day as needed, but also, encourage patient to take her Xanax especially in the morning before she has a heavily exertioning event knowing that that will cause her to feel more anxious.  We also discussed additional strategies to help with her anxiety.  Patient likes to wear continuous pulse ox and constantly frets when her oxygen saturations go down with exertion.  After some discussion, she is willing to try putting away  her pulse ox and only checking it if her breathing becomes worse and does not improve after several minutes.  We also discussed calming down and breathing exercises.  Hypomagnesemia Replacing as needed  Hypokalemia Replacing as needed.  Low on admission 2.6, probably from attempting to use Lasix at home.  Chronic heart failure with preserved ejection  fraction (HFpEF) (HCC) Echocardiogram done in Oct of 2023 notes preserved ejection fraction and grade 1 diastolic dysfunction  HTN (hypertension) Blood pressure stable  Non-insulin dependent type 2 diabetes mellitus (HCC) CBG's stable, sliding scale  Normocytic anemia Secondary to her chronic disease.  Slightly lower than previous baseline, but no indications for acute bleed  Hypothyroidism Continue synthroid  Obesity (BMI 30-39.9) Meets criteria with BMI >30       Body mass index is 33.16 kg/m.        Consultants: Pulmonary Palliative care  Procedures: None  Antimicrobials: None  Code Status: DNR   Subjective: Less anxious, breathing okay, but still drops with ambulation  Objective:  Vitals:   10/05/22 0902 10/05/22 1134  BP: (!) 122/59 120/61  Pulse:  80  Resp:  18  Temp:  97.9 F (36.6 C)  SpO2:  98%    Intake/Output Summary (Last 24 hours) at 10/05/2022 1503 Last data filed at 10/05/2022 0900 Gross per 24 hour  Intake 480 ml  Output 200 ml  Net 280 ml   Filed Weights   10/03/22 0600 10/04/22 0020 10/05/22 0447  Weight: 95.6 kg 95.8 kg 93.2 kg   Body mass index is 33.16 kg/m.  Exam:  General: Alert and oriented x 3, less fatigued HEENT: Normocephalic, atraumatic, mucous membranes are slightly dry Cardiovascular: Regular rate and rhythm, no ectopic beats Respiratory: Decreased breath sounds throughout, better inspiratory effort Abdomen: Soft, nontender, nondistended, positive bowel sounds Musculoskeletal: No clubbing or cyanosis, no pitting edema Skin: No skin breaks, tears or lesions Psychiatry: Appropriate, no evidence of psychoses, mildly anxious Neurology: No focal deficits  Data Reviewed: Bicarb 33  Disposition:  Status is: Inpatient    Anticipated discharge date: 3/16  Remaining issues to be resolved so that patient can be discharged:  -Evaluation and plan of care by palliative care -Evaluation by physical therapy for  consideration of skilled nursing   Family Communication: Daughter at bedside DVT Prophylaxis: enoxaparin (LOVENOX) injection 40 mg Start: 10/02/22 1400    Author: Annita Brod ,MD 10/05/2022 3:03 PM  To reach On-call, see care teams to locate the attending and reach out via [email or web address]. Between 7PM-7AM, please contact night-coverage If you still have difficulty reaching the attending provider, please page the [facility] (Director on Call) for [facility] on amion for assistance.

## 2022-10-05 NOTE — Evaluation (Signed)
Occupational Therapy Evaluation Patient Details Name: Kaitlin Hamilton MRN: [id number] DOB: 23-Jun-1951 Today's Date: 10/05/2022   History of Present Illness 72 y.o. female presents to [facility] hospital on 10/01/2022 with DOE and hypoxia. With any exertion pt sats dropping to 60s while on her baseline 9L [place]. PMH includes ILD, chronic respiratory failure with hypoxemia, DMII, HTN, diastolic HF.   Clinical Impression   Pt reports needing increased assist 2 weeks PTA, assist for all ADLs, and ambulates short distances (~42ft) at home without AD. Pt lives alone but has had caregivers assist close to around the clock. Pt needing min-max A for ADLs, min A for transfers with 1 person HHA. Pt SpO2 dropping as low as 69% on 11L O2 during session with mobility, recovered to high 80s/low 90's after a few mins on 9L O2 at rest. Pt presenting with impairments listed below, will follow acutely. Recommend SNF at d/c.     Recommendations for follow up therapy are one component of a multi-disciplinary discharge planning process, led by the attending physician.  Recommendations may be updated based on patient status, additional functional criteria and insurance authorization.   Follow Up Recommendations  Skilled nursing-short term rehab (<3 hours/day)     Assistance Recommended at Discharge Frequent or constant Supervision/Assistance  Patient can return home with the following A lot of help with walking and/or transfers;A lot of help with bathing/dressing/bathroom;Assistance with cooking/housework;Assist for transportation;Help with stairs or ramp for entrance    Functional Status Assessment  Patient has had a recent decline in their functional status and demonstrates the ability to make significant improvements in function in a reasonable and predictable amount of time.  Equipment Recommendations  Other (comment) (defer)    Recommendations for Other Services PT consult     Precautions / Restrictions  Precautions Precautions: Fall Precaution Comments: monitor sats Restrictions Weight Bearing Restrictions: No      Mobility Bed Mobility               General bed mobility comments: OOB in chair upon arrival and departure    Transfers Overall transfer level: Needs assistance Equipment used: 1 person hand held assist Transfers: Sit to/from Stand Sit to Stand: Min assist                  Balance Overall balance assessment: Needs assistance Sitting-balance support: No upper extremity supported, Feet supported Sitting balance-Leahy Scale: Good     Standing balance support: Bilateral upper extremity supported, Reliant on assistive device for balance Standing balance-Leahy Scale: Poor                             ADL either performed or assessed with clinical judgement   ADL Overall ADL's : Needs assistance/impaired Eating/Feeding: Set up   Grooming: Minimal assistance   Upper Body Bathing: Moderate assistance   Lower Body Bathing: Maximal assistance   Upper Body Dressing : Moderate assistance   Lower Body Dressing: Maximal assistance   Toilet Transfer: Minimal assistance;Ambulation;BSC/3in1   Toileting- Water quality scientist and Hygiene: Supervision/safety       Functional mobility during ADLs: Minimal assistance       Vision   Vision Assessment?: No apparent visual deficits     Perception Perception Perception Tested?: No   Praxis Praxis Praxis tested?: Not tested    Pertinent Vitals/Pain Pain Assessment Pain Assessment: No/denies pain     Hand Dominance     Extremity/Trunk Assessment Upper Extremity Assessment Upper  Extremity Assessment: Generalized weakness   Lower Extremity Assessment Lower Extremity Assessment: Defer to PT evaluation   Cervical / Trunk Assessment Cervical / Trunk Assessment: Other exceptions Cervical / Trunk Exceptions: excess body habitus   Communication Communication Communication: No  difficulties   Cognition Arousal/Alertness: Awake/alert Behavior During Therapy: WFL for tasks assessed/performed Overall Cognitive Status: Within Functional Limits for tasks assessed                                       General Comments  SpO2 dropping ot 69% at lowest on 11L O2 with mobility, increased to low 90's and titrated down to 9L at end of session    Exercises     Shoulder Instructions      Home Living Family/patient expects to be discharged to:: Private residence Living Arrangements: Alone Available Help at Discharge: Family;Available PRN/intermittently Type of Home: Apartment Home Access: Stairs to enter Entrance Stairs-Number of Steps: 6 Entrance Stairs-Rails: Right;Left Home Layout: One level     Bathroom Shower/Tub: Tub/shower unit         Home Equipment: Shower seat;Hand held Chief Technology Officer (4 wheels);Other (comment) (lift chair)   Additional Comments: reports previously using 9L O2 at rest and up to 11L O2 with mobility      Prior Functioning/Environment Prior Level of Function : Needs assist             Mobility Comments: ambulates for short household distances with fatigue without DME ADLs Comments: requires assistance for IADLs and bathing        OT Problem List: Decreased strength;Decreased range of motion;Decreased activity tolerance;Impaired balance (sitting and/or standing);Cardiopulmonary status limiting activity      OT Treatment/Interventions: Self-care/ADL training;Therapeutic exercise;Energy conservation;DME and/or AE instruction;Therapeutic activities;Patient/family education;Balance training    OT Goals(Current goals can be found in the care plan section) Acute Rehab OT Goals Patient Stated Goal: none stated OT Goal Formulation: With patient Time For Goal Achievement: 10/19/22 Potential to Achieve Goals: Good ADL Goals Pt Will Perform Upper Body Dressing: with supervision;sitting Pt Will Perform Lower  Body Dressing: with supervision;sitting/lateral leans;sit to/from stand Pt Will Transfer to Toilet: with supervision;ambulating;regular height toilet Additional ADL Goal #1: pt will verbalize 3 energy conservation strategies in prep for ADLs  OT Frequency: Min 2X/week    Co-evaluation              AM-PAC OT "6 Clicks" Daily Activity     Outcome Measure Help from another person eating meals?: None Help from another person taking care of personal grooming?: A Little Help from another person toileting, which includes using toliet, bedpan, or urinal?: A Lot Help from another person bathing (including washing, rinsing, drying)?: A Lot Help from another person to put on and taking off regular upper body clothing?: A Lot Help from another person to put on and taking off regular lower body clothing?: A Lot 6 Click Score: 15   End of Session Equipment Utilized During Treatment: Gait belt;Oxygen (9-11L) Nurse Communication: Mobility status;Other (comment) (SpO2 drop, and pt requesting anxiety medication prior to session)  Activity Tolerance: Patient limited by fatigue;Treatment limited secondary to medical complications (Comment) (SpO2) Patient left: in chair;with call bell/phone within reach;with family/visitor present  OT Visit Diagnosis: Unsteadiness on feet (R26.81);Other abnormalities of gait and mobility (R26.89);Muscle weakness (generalized) (M62.81)                Time: [id number] OT Time  Calculation (min): 36 min Charges:  OT General Charges $OT Visit: 1 Visit OT Evaluation $OT Eval Moderate Complexity: 1 Mod OT Treatments $Self Care/Home Management : 8-22 mins  Renaye Rakers, OTD, OTR/L SecureChat Preferred Acute Rehab [phone]   Renaye Rakers [place] 10/05/2022, 3:31 PM

## 2022-10-05 NOTE — Progress Notes (Signed)
NAME:  Kaitlin Hamilton, MRN:  [id number], DOB:  27-Feb-1951, LOS: 3 ADMISSION DATE:  10/01/2022, CONSULTATION DATE:  10/02/2022 REFERRING MD:  Dr. [place] Pink, CHIEF COMPLAINT:  Acute on Chronic Hypoxemic Respiratory Failure   History of Present Illness:  72 y/o female with PMH of UIP fibrosis with chronic hypoxemic respiratory failure on 9 L home O2 with oxymizer, T2DM, HTN, HFpEF, GERD, hypothyroidism, GAD, depression, and insomnia presenting with progressive dyspnea with increased oxygen requirements and associated productive cough and fevers over the past 2 weeks. She is followed in the pulmonology clinic for her UIP fibrosis.  She is currently on CellCept, pirfenidone, and prednisone 5-10 mg daily as well as had 1 rituximab infusion on 09/16/2022. She also takes zyrtec and Singulair but no inhalers. Since infusion she noticed increased congestion, postnasal drip, productive cough with green sputum, and intermittent fevers.  She was treated with a course of azithromycin on 09/23/2022.  She has had some improvement with her cough getting a little better in her sputum going from dark green to light or green.  She does have much more shortness of breath with needing extra supplemental oxygen from her 9 L when she is not at rest. She last took a dose of lasix 20 mg on 3/12 PM.   In the ED she was afebrile, hemodynamically stable, with SpO2 >90 on 9L O2 through oxymixer. Slight leukocytosis at 10.3, CXR with diffuse fibrosis but no focal findings, high res chest CT showed stable moderate pulmonary fibrosis. She also had a potassium of 2.4, Mg of 1.3, bicarb of 39, normal lactic acid, no troponin elevation. She was given a dose of ceftriaxone, solu-medrol 40 mg, and IV Mg/K.  Admitted to hospitalist.  Goals of care discussed with patient and daughter. They were planing on meeting with palliative care outpatient in the near future. Patient fully understands her progressive lung disease and the likely trajectory. She is  consistent and fully understands DNR status. Understands hospice in the context of palliative care as well as comfort care. Goal of this admission is to optimize medically and discharge home. On board with palliative consult here for further discussions.   Pertinent  Medical History  UIP Fibrosis Chronic Hypoxemic Respiratory Failure on 9L O2 at home T2DM HTN HFpEF GERD Hypothyroidism GAD/Depression Insomnia  Significant Hospital Events: Including procedures, antibiotic start and stop dates in addition to other pertinent events   3/13 IV steroids and ctx  Interim History / Subjective:  Sitting up in chair reports feeling better  Objective   Blood pressure 131/67, pulse 60, temperature 98.4 F (36.9 C), temperature source Oral, resp. rate 16, height 5\' 6"  (1.676 m), weight 93.2 kg, SpO2 98 %.        Intake/Output Summary (Last 24 hours) at 10/05/2022 0724 Last data filed at 10/04/2022 2100 Gross per 24 hour  Intake 240 ml  Output 200 ml  Net 40 ml   Filed Weights   10/03/22 0600 10/04/22 0020 10/05/22 0447  Weight: 95.6 kg 95.8 kg 93.2 kg    Examination: General: Obese ill-appearing female HEENT: No JVD is noted Neuro: Grossly intact CV: Heart sounds are distant PULM: Decreased air movement Currently on 10 L nasal cannula sats 94% GI: soft, bsx4 active  GU: Extremities: warm/dry, 1+ edema  Skin: no rashes or lesions Recent Labs  Lab 10/03/22 0046 10/04/22 0037 10/05/22 0101  NA 139 138 138  K 3.3* 4.1 4.2  CL 91* 94* 95*  CO2 41* 37* 33*  BUN  11 14 15   CREATININE 0.81 0.52 0.54  GLUCOSE 157* 221* 224*   Recent Labs  Lab 10/01/22 1815 10/02/22 0545  HGB 9.9* 9.9*  HCT 32.7* 32.1*  WBC 10.6* 10.0  PLT 402* 351  No chest x-ray   Resolved Hospital Problem list     Assessment & Plan:  AAcute on chronic hypoxic respiratory failure in setting of interstitial lung disease Suspect ILD flare. Treated with IV steroids, antibiotics Continue home  CellCept, pirfenidone Wean down oxygen as tolerated Can change to prednisone 60 mg with slow taper by 10 mg a week.  HTN HFpEF Per primary  T2DM Hypothyroidism GERD Per primary  Anxiety Depression Insomnia Continue Xanax  Goals of Care Currently DNR Maximize with discharge home Palliative care is involved  Will make follow-up in clinic in the next 1 to 2 weeks.  PCCM will be available as needed.  Please call with any questions.  Best Practice (right click and "Reselect all SmartList Selections" daily)   Per primary team  Signature:   [name] MD [facility] See Amion for pager  If no response to pager , please call [phone] until 7pm After 7:00 pm call Elink  [phone] 10/05/2022, 7:24 AM

## 2022-10-06 DIAGNOSIS — Z7189 Other specified counseling: Secondary | ICD-10-CM | POA: Diagnosis not present

## 2022-10-06 DIAGNOSIS — E874 Mixed disorder of acid-base balance: Secondary | ICD-10-CM

## 2022-10-06 DIAGNOSIS — J9621 Acute and chronic respiratory failure with hypoxia: Secondary | ICD-10-CM | POA: Diagnosis not present

## 2022-10-06 DIAGNOSIS — J849 Interstitial pulmonary disease, unspecified: Secondary | ICD-10-CM | POA: Diagnosis not present

## 2022-10-06 DIAGNOSIS — F419 Anxiety disorder, unspecified: Secondary | ICD-10-CM

## 2022-10-06 DIAGNOSIS — Z515 Encounter for palliative care: Secondary | ICD-10-CM | POA: Diagnosis not present

## 2022-10-06 LAB — CULTURE, BLOOD (ROUTINE X 2)
Culture: NO GROWTH
Culture: NO GROWTH
Special Requests: ADEQUATE

## 2022-10-06 LAB — BASIC METABOLIC PANEL
Anion gap: 8 (ref 5–15)
BUN: 18 mg/dL (ref 8–23)
CO2: 33 mmol/L — ABNORMAL HIGH (ref 22–32)
Calcium: 9.2 mg/dL (ref 8.9–10.3)
Chloride: 92 mmol/L — ABNORMAL LOW (ref 98–111)
Creatinine, Ser: 0.72 mg/dL (ref 0.44–1.00)
GFR, Estimated: >60 mL/min (ref >60)
Glucose, Bld: 173 mg/dL — ABNORMAL HIGH (ref 70–99)
Potassium: 4 mmol/L (ref 3.5–5.1)
Sodium: 133 mmol/L — ABNORMAL LOW (ref 135–145)

## 2022-10-06 LAB — GLUCOSE, CAPILLARY
Glucose-Capillary: 157 mg/dL — ABNORMAL HIGH (ref 70–99)
Glucose-Capillary: 247 mg/dL — ABNORMAL HIGH (ref 70–99)
Glucose-Capillary: 256 mg/dL — ABNORMAL HIGH (ref 70–99)
Glucose-Capillary: 226 mg/dL — ABNORMAL HIGH (ref 70–99)

## 2022-10-06 MED ORDER — MELATONIN 3 MG PO TABS
3.0000 mg | ORAL_TABLET | Freq: Every day | ORAL | Status: DC
  Administered 2022-10-06 – 2022-10-10 (×5): 3 mg via ORAL
  Filled 2022-10-06 (×5): qty 1

## 2022-10-06 MED ORDER — ALPRAZOLAM 0.25 MG PO TABS
0.2500 mg | ORAL_TABLET | Freq: Two times a day (BID) | ORAL | Status: DC
  Administered 2022-10-06 – 2022-10-11 (×8): 0.25 mg via ORAL
  Filled 2022-10-06 (×9): qty 1

## 2022-10-06 MED ORDER — ALPRAZOLAM 0.25 MG PO TABS
0.2500 mg | ORAL_TABLET | Freq: Two times a day (BID) | ORAL | Status: DC | PRN
  Administered 2022-10-07 – 2022-10-11 (×9): 0.25 mg via ORAL
  Filled 2022-10-06 (×9): qty 1

## 2022-10-06 NOTE — NC FL2 (Addendum)
[place] LEVEL OF CARE FORM     IDENTIFICATION  Patient Name: Kaitlin Hamilton Birthdate: 1951/05/21 Sex: female Admission Date (Current Location): 10/01/2022  [facility] and [place] Number:  Herbalist and Address:  [facility], [place] [address] [place], [place] [address]      Provider Number: [id number]  Attending Physician Name and Address:  Annita Brod, MD  Relative Name and Phone Number:       Current Level of Care: Hospital Recommended Level of Care: [place] Prior Approval Number:    Date Approved/Denied:   PASRR Number: Pending  Discharge Plan: SNF    Current Diagnoses: Patient Active Problem List   Diagnosis Date Noted   Metabolic alkalosis with respiratory acidosis 10/03/2022   Anxiety 10/03/2022   Hypomagnesemia 10/02/2022   QT prolongation 10/02/2022   Normocytic anemia 10/02/2022   Obesity (BMI 30-39.9) 10/02/2022   Acute and chronic respiratory failure with hypoxia ([place]) 10/02/2022   History of herpes genitalis 07/03/2022   Chronic heart failure with preserved ejection fraction (HFpEF) ([place]) 06/06/2022   Lumbar back pain with radiculopathy affecting left lower extremity 01/23/2021   Nausea vomiting and diarrhea 02/21/2019   Non-insulin dependent type 2 diabetes mellitus ([place]) 07/20/2018   MDD (recurrent major depressive disorder) in remission ([place]) 12/08/2017   Chronic insomnia 12/08/2017   Candidal intertrigo 07/08/2016   Right sciatic nerve pain 03/09/2015   Chronic pain of left thumb 03/09/2015   Allergic rhinitis 10/06/2014   Hypokalemia 05/25/2014   Acute on chronic respiratory failure with hypoxemia ([place]) 01/31/2014   Cystocele 11/11/2013   HTN (hypertension) 08/25/2013   Hyperlipidemia associated with type 2 diabetes mellitus ([place]) 08/25/2013   Common migraine 08/25/2013   Hypothyroidism 08/25/2013   OSA (obstructive sleep apnea) 08/22/2013   ILD (interstitial lung disease)  ([place]) 06/29/2013   GERD (gastroesophageal reflux disease) 06/01/2013   Esophageal dysmotility 05/17/2013   UIP (usual interstitial pneumonitis) ([place]) 10/19/2012    Orientation RESPIRATION BLADDER Height & Weight     Self, Time, Situation, Place  O2 (9 liters) Continent Weight: 206 lb 12.7 oz (93.8 kg) Height:  5\' 6"  (167.6 cm)  BEHAVIORAL SYMPTOMS/MOOD NEUROLOGICAL BOWEL NUTRITION STATUS      Continent Diet (See dc summary)  AMBULATORY STATUS COMMUNICATION OF NEEDS Skin   Limited Assist Verbally Normal                       Personal Care Assistance Level of Assistance  Bathing, Feeding, Dressing Bathing Assistance: Limited assistance Feeding assistance: Independent Dressing Assistance: Limited assistance     Functional Limitations Info  Sight, Speech, Hearing Sight Info: Adequate Hearing Info: Adequate Speech Info: Adequate    SPECIAL CARE FACTORS FREQUENCY  PT (By licensed PT), OT (By licensed OT)     PT Frequency: 5xweek OT Frequency: 5xweek            Contractures Contractures Info: Not present    Additional Factors Info  Code Status, Allergies Code Status Info: DNR Allergies Info: Adhesive (Tape)  Atovaquone  Codeine  Demerol (Meperidine)  Hydrocodone  Augmentin (Amoxicillin-pot Clavulanate)  Azithromycin  Trazodone And Nefazodone  Sulfa Antibiotics           Current Medications (10/06/2022):  This is the current hospital active medication list Current Facility-Administered Medications  Medication Dose Route Frequency Provider Last Rate Last Admin   acetaminophen (TYLENOL) tablet 650 mg  650 mg Oral Q6H PRN Shela Leff,  MD   650 mg at 10/04/22 2114   Or   acetaminophen (TYLENOL) suppository 650 mg  650 mg Rectal Q6H PRN Shela Leff, MD       ALPRAZolam Duanne Moron) tablet 0.25 mg  0.25 mg Oral QID PRN Annita Brod, MD   0.25 mg at 10/06/22 [id number]   azelastine (ASTELIN) 0.1 % nasal spray 2 spray  2 spray Each Nare BID Candee Furbish, MD    2 spray at 10/06/22 [id number]   baclofen (LIORESAL) tablet 10 mg  10 mg Oral BID PRN Minor, Grace Bushy, NP   10 mg at 10/04/22 2114   cefTRIAXone (ROCEPHIN) 2 g in sodium chloride 0.9 % 100 mL IVPB  2 g Intravenous Q24H Shela Leff, MD 200 mL/hr at 10/06/22 0810 2 g at 10/06/22 0810   enoxaparin (LOVENOX) injection 40 mg  40 mg Subcutaneous Q24H Shela Leff, MD   40 mg at 10/06/22 1413   furosemide (LASIX) tablet 20 mg  20 mg Oral Osborne Oman, MD   20 mg at 10/05/22 0859   guaiFENesin (MUCINEX) 12 hr tablet 600 mg  600 mg Oral Daily Gevena Barre K, MD   600 mg at 10/06/22 0807   insulin aspart (novoLOG) injection 0-5 Units  0-5 Units Subcutaneous QHS Shela Leff, MD   4 Units at 10/05/22 2042   insulin aspart (novoLOG) injection 0-9 Units  0-9 Units Subcutaneous TID WC Shela Leff, MD   3 Units at 10/06/22 1211   levothyroxine (SYNTHROID) tablet 125 mcg  125 mcg Oral Q0600 Shela Leff, MD   125 mcg at 10/06/22 0621   loratadine (CLARITIN) tablet 10 mg  10 mg Oral Daily Candee Furbish, MD   10 mg at 10/06/22 [id number]   losartan (COZAAR) tablet 50 mg  50 mg Oral Daily Shela Leff, MD   50 mg at 10/06/22 [id number]   methylPREDNISolone sodium succinate (SOLU-MEDROL) 125 mg/2 mL injection 80 mg  80 mg Intravenous Daily Shela Leff, MD   80 mg at 10/06/22 0808   montelukast (SINGULAIR) tablet 10 mg  10 mg Oral QHS Shela Leff, MD   10 mg at 10/05/22 2041   mycophenolate (CELLCEPT) capsule 1,500 mg  1,500 mg Oral BID Shela Leff, MD   1,500 mg at 10/06/22 0808   pantoprazole (PROTONIX) EC tablet 40 mg  40 mg Oral BID Shela Leff, MD   40 mg at 10/06/22 [id number]   Pirfenidone TABS 801 mg  801 mg Oral Q8H Annita Brod, MD   801 mg at 10/06/22 1413   rosuvastatin (CRESTOR) tablet 10 mg  10 mg Oral Daily Shela Leff, MD   10 mg at 10/06/22 0807   verapamil (CALAN-SR) CR tablet 180 mg  180 mg Oral Daily Shela Leff, MD    180 mg at 10/06/22 [id number]     Discharge Medications: Please see discharge summary for a list of discharge medications.  Relevant Imaging Results:  Relevant Lab Results:   Additional Information SSN: [id number]  Beckey Rutter, MSW, [place] Transitions of Care  Clinical Social Worker I

## 2022-10-06 NOTE — Care Management Important Message (Signed)
Important Message  Patient Details  Name: Kaitlin Hamilton MRN: [id number] Date of Birth: [birth year]   Medicare Important Message Given:  Yes     [name] 10/06/2022, 8:08 AM

## 2022-10-06 NOTE — Progress Notes (Addendum)
Daily Progress Note   Patient Name: Kaitlin Hamilton       Date: 10/06/2022 DOB: Aug 12, 1950  Age: 72 y.o. MRN#: [id number] Attending Physician: Annita Brod, MD Primary Care Physician: [name], MD Admit Date: 10/01/2022  Reason for Consultation/Follow-up: Establishing goals of care  Subjective: Medical records reviewed progress notes, labs, imaging. Patient assessed at the bedside. She is sitting in bedside chair and denies pain or distress. Her daughter and friend are present visiting.   Created space and opportunity for patient's thoughts and feelings on the past few days since we last spoke, as well as the recommendations for SNF placement.  Patient has come to terms with this, as she understands this is the best way to ensure her improved quality of life and safety when she does return home.  Her biggest concerns are whether SNF will appropriately administer her Xanax 4 times a day as needed and whether they have a recliner available for her to use.  Questions and concerns addressed.  Provided an additional copy of hard choices for living people booklet for patient's daughter Kaitlin Hamilton.  PMT will continue to support holistically.   Length of Stay: 4  Physical Exam Vitals and nursing note reviewed.  Constitutional:      General: She is not in acute distress.    Appearance: She is ill-appearing.  Cardiovascular:     Rate and Rhythm: Normal rate.  Pulmonary:     Effort: Pulmonary effort is normal. No respiratory distress.  Neurological:     Mental Status: She is alert. Mental status is at baseline.  Psychiatric:        Mood and Affect: Mood normal.        Behavior: Behavior normal.            Vital Signs: BP 128/60 (BP Location: Right Arm)   Pulse 78   Temp 97.7 F (36.5 C)  (Oral)   Resp 20   Ht 5\' 6"  (1.676 m)   Wt 93.8 kg   SpO2 98%   BMI 33.38 kg/m  SpO2: SpO2: 98 % O2 Device: O2 Device: Other (Comment) (oximyzer) O2 Flow Rate: O2 Flow Rate (L/min): 9 L/min      Palliative Assessment/Data: 50%   Palliative Care Assessment & Plan   Patient Profile: 72 y.o. female  with  past medical history of IP fibrosis, chronic hypoxemic respiratory failure on 9 L O2, OSA on CPAP, type 2 diabetes, hypertension, chronic HFpEF, anxiety, depression, insomnia, GERD, hypothyroidism admitted on 10/01/2022 with dyspnea, cough, fever.    Patient is admitted for acute on chronic hypoxic respiratory failure, likely due to progression of ILD/UIP fibrosis. PMT has been consulted to assist with goals of care conversation.  Assessment: Goals of care conversation Acute on chronic hypoxic respiratory failure UIP fibrosis  Recommendations/Plan: Continue DNR/DNI Continue treating the treatable, no escalation or aggressive interventions if she declines further Goals are for SNF placement for short-term rehab then return home to focus on comfort Psychosocial and emotional support provided Patient is established with outpatient palliative care and would like to continue upon discharge; I have reached out to [place] liaison to provide an update and ensure smooth transition of care Please ensure patient will have discharge instructions that include need for PRN Xanax QID PMT will continue to follow and support   Prognosis:  Poor long-term prognosis given functional decline, several chronic comorbidities   Discharge Planning: Home with Palliative Services  Care plan was discussed with patient, patient's daughter   MDM high         [place], PA-C  Palliative Medicine Team Team phone # [phone]  Thank you for allowing the Palliative Medicine Team to assist in the care of this patient. Please utilize secure chat with additional questions, if there is no  response within 30 minutes please call the above phone number.  Palliative Medicine Team providers are available by phone from 7am to 7pm daily and can be reached through the team cell phone.  Should this patient require assistance outside of these hours, please call the patient's attending physician.

## 2022-10-06 NOTE — Progress Notes (Signed)
Physical Therapy Treatment Patient Details Name: Kaitlin Hamilton MRN: [id number] DOB: [birth year] Today's Date: 10/06/2022   History of Present Illness 72 y.o. female presents to [facility] hospital on 10/01/2022 with DOE and hypoxia. With any exertion pt sats dropping to 60s while on her baseline 9L [place]. PMH includes ILD, chronic respiratory failure with hypoxemia, DMII, HTN, diastolic HF.    PT Comments    Pt was seen for mobility with close watch of her O2 sats given pt is desaturating even on 15 L O2 and with pursed lip breathing instructions for the entire effort.  Did require one sit rest even limiting conversation and reducing the walk distances with standing rest breaks.  Pt is supported by family but are not close by to assist her.  Have upgraded recommendations to SNF for increasing support to progress her walking safety and monitor her systemic tolerances to move.  Pt has very extensive chronic lung disease, and may ultimately benefit from an ALF placement after SNF.  Recommendations were forwarded to staff to work on the plan.   Recommendations for follow up therapy are one component of a multi-disciplinary discharge planning process, led by the attending physician.  Recommendations may be updated based on patient status, additional functional criteria and insurance authorization.  Follow Up Recommendations  Skilled nursing-short term rehab (<3 hours/day) Can patient physically be transported by private vehicle: No   Assistance Recommended at Discharge Intermittent Supervision/Assistance  Patient can return home with the following A little help with walking and/or transfers;A little help with bathing/dressing/bathroom;Assistance with cooking/housework;Assist for transportation;Help with stairs or ramp for entrance   Equipment Recommendations  Rolling walker (2 wheels);BSC/3in1    Recommendations for Other Services       Precautions / Restrictions Precautions Precautions: Fall Precaution  Comments: monitor sats, desats on 15 L Restrictions Weight Bearing Restrictions: No     Mobility  Bed Mobility               General bed mobility comments: in chair when PT arrives, sleeps in recliner    Transfers Overall transfer level: Needs assistance Equipment used: Rolling walker (2 wheels) Transfers: Sit to/from Stand Sit to Stand: Min guard           General transfer comment: min guard for safety    Ambulation/Gait Ambulation/Gait assistance: Min guard Gait Distance (Feet): 40 Feet (15+25) Assistive device: Rolling walker (2 wheels) Gait Pattern/deviations: Step-through pattern, Wide base of support (wide turns) Gait velocity: reduced Gait velocity interpretation: <1.31 ft/sec, indicative of household ambulator Pre-gait activities: standing O2 ck General Gait Details: requires pursed lip breathing through most of the walk   Stairs             Wheelchair Mobility    Modified Rankin (Stroke Patients Only)       Balance Overall balance assessment: Needs assistance Sitting-balance support: Feet supported Sitting balance-Leahy Scale: Good     Standing balance support: Bilateral upper extremity supported, During functional activity Standing balance-Leahy Scale: Fair Standing balance comment: less than fair dynamically                            Cognition Arousal/Alertness: Awake/alert Behavior During Therapy: WFL for tasks assessed/performed Overall Cognitive Status: Within Functional Limits for tasks assessed  Exercises      General Comments General comments (skin integrity, edema, etc.): lowest sat was 84% but had to go to 15 L to be there with fairly continual pursed lip breathing      Pertinent Vitals/Pain Pain Assessment Pain Assessment: No/denies pain    Home Living                          Prior Function            PT Goals (current goals  can now be found in the care plan section) Acute Rehab PT Goals Patient Stated Goal: to increase standing/walking for home Progress towards PT goals: Not progressing toward goals - comment (O2 sats are making progress a challenge)    Frequency    Min 2X/week      PT Plan Discharge plan needs to be updated    Co-evaluation              AM-PAC PT "6 Clicks" Mobility   Outcome Measure  Help needed turning from your back to your side while in a flat bed without using bedrails?: A Lot Help needed moving from lying on your back to sitting on the side of a flat bed without using bedrails?: A Lot Help needed moving to and from a bed to a chair (including a wheelchair)?: A Lot Help needed standing up from a chair using your arms (e.g., wheelchair or bedside chair)?: A Little Help needed to walk in hospital room?: A Little Help needed climbing 3-5 steps with a railing? : A Lot 6 Click Score: 14    End of Session Equipment Utilized During Treatment: Oxygen Activity Tolerance: Patient limited by fatigue;Treatment limited secondary to medical complications (Comment) Patient left: in chair;with call bell/phone within reach;with family/visitor present;with nursing/sitter in room Nurse Communication: Mobility status PT Visit Diagnosis: Other abnormalities of gait and mobility (R26.89);Muscle weakness (generalized) (M62.81);Difficulty in walking, not elsewhere classified (R26.2)     Time: 1133-1203 PT Time Calculation (min) (ACUTE ONLY): 30 min  Charges:  $Gait Training: 8-22 mins $Therapeutic Activity: 8-22 mins     Ramond Dial 10/06/2022, 12:31 PM  Mee Hives, PT PhD Acute Rehab Dept. Number: [place] and [place]

## 2022-10-06 NOTE — Inpatient Diabetes Management (Signed)
Inpatient Diabetes Program Recommendations  AACE/ADA: New Consensus Statement on Inpatient Glycemic Control (2015)  Target Ranges:  Prepandial:   less than 140 mg/dL      Peak postprandial:   less than 180 mg/dL (1-2 hours)      Critically ill patients:  140 - 180 mg/dL   Lab Results  Component Value Date   GLUCAP 157 (H) 10/06/2022   HGBA1C 6.2 (A) 09/09/2022    Latest Reference Range & Units 10/05/22 06:10 10/05/22 11:33 10/05/22 16:43 10/05/22 20:38 10/06/22 06:25  Glucose-Capillary 70 - 99 mg/dL 145 (H) 218 (H) 304 (H) 314 (H) 157 (H)  (H): Data is abnormally high   Diabetes history: DM2 Outpatient Diabetes medications: Glucotrol 10 mg qd, Metformin 1 gm bid Current orders for Inpatient glycemic control: Novolog 0-9 units tid, 0-5 units hs, Solumedrol 80 mg IV qd  Inpatient Diabetes Program Recommendations:   While oral DM meds held and on steroids, please consider: -Add Novolog 5 units tid meal coverage if eats 50%  Thank you, Bethena Roys E. Lukas Pelcher, RN, MSN, CDE  Diabetes Coordinator Inpatient Glycemic Control Team Team Pager [phone] (8am-5pm) 10/06/2022 9:42 AM

## 2022-10-06 NOTE — TOC Initial Note (Signed)
Transition of Care [facility]) - Initial/Assessment Note    Patient Details  Name: Kaitlin Hamilton MRN: [id number] Date of Birth: [birth year]  Transition of Care [facility]) CM/SW Contact:    Bjorn Pippin, LCSW Phone Number: 10/06/2022, 3:57 PM  Clinical Narrative:                 CSW spoke with pt along with pt POA's ([name] and Dorian Pod) regarding recommendation for SNF. Pt is agreeable to SNF and requesting to be in [place].   CSW completed fl2 and faxed out. Pt PASRR is a level 2 and CSW has uploaded additional documentation.   Expected Discharge Plan: Skilled Nursing Facility Barriers to Discharge: Continued Medical Work up   Patient Goals and CMS Choice            Expected Discharge Plan and Services                                              Prior Living Arrangements/Services     Patient language and need for interpreter reviewed:: Yes Do you feel safe going back to the place where you live?: Yes      Need for Family Participation in Patient Care: Yes (Comment) Care giver support system in place?: Yes (comment)   Criminal Activity/Legal Involvement Pertinent to Current Situation/Hospitalization: No - Comment as needed  Activities of Daily Living      Permission Sought/Granted Permission sought to share information with : Family Supports, Customer service manager Permission granted to share information with : Yes, Verbal Permission Granted  Share Information with NAME: [name]     Permission granted to share info w Relationship: Daughter  Permission granted to share info w Contact Information: [phone]  Emotional Assessment Appearance:: Appears stated age Attitude/Demeanor/Rapport: Engaged Affect (typically observed): Accepting Orientation: : Oriented to Self, Oriented to Place, Oriented to  Time, Oriented to Situation Alcohol / Substance Use: Not Applicable Psych Involvement: No (comment)  Admission diagnosis:  ILD (interstitial lung  disease) ([place]) [J84.9] Community acquired pneumonia, unspecified laterality [J18.9] Acute and chronic respiratory failure with hypoxia ([place]) [J96.21] Patient Active Problem List   Diagnosis Date Noted   Metabolic alkalosis with respiratory acidosis 10/03/2022   Anxiety 10/03/2022   Hypomagnesemia 10/02/2022   QT prolongation 10/02/2022   Normocytic anemia 10/02/2022   Obesity (BMI 30-39.9) 10/02/2022   Acute and chronic respiratory failure with hypoxia ([place]) 10/02/2022   History of herpes genitalis 07/03/2022   Chronic heart failure with preserved ejection fraction (HFpEF) ([place]) 06/06/2022   Lumbar back pain with radiculopathy affecting left lower extremity 01/23/2021   Nausea vomiting and diarrhea 02/21/2019   Non-insulin dependent type 2 diabetes mellitus ([place]) 07/20/2018   MDD (recurrent major depressive disorder) in remission ([place]) 12/08/2017   Chronic insomnia 12/08/2017   Candidal intertrigo 07/08/2016   Right sciatic nerve pain 03/09/2015   Chronic pain of left thumb 03/09/2015   Allergic rhinitis 10/06/2014   Hypokalemia 05/25/2014   Acute on chronic respiratory failure with hypoxemia ([place]) 01/31/2014   Cystocele 11/11/2013   HTN (hypertension) 08/25/2013   Hyperlipidemia associated with type 2 diabetes mellitus ([place]) 08/25/2013   Common migraine 08/25/2013   Hypothyroidism 08/25/2013   OSA (obstructive sleep apnea) 08/22/2013   ILD (interstitial lung disease) ([place]) 06/29/2013   GERD (gastroesophageal reflux disease) 06/01/2013   Esophageal dysmotility 05/17/2013   UIP (usual interstitial pneumonitis) ([place])  10/19/2012   PCP:  [name], MD Pharmacy:   [place], [address] [place] [address] Phone: [phone] Fax: [phone]  [place], [place] [place] [address] Phone: [phone] Fax: [phone]  [place], [address] [place] [address] Phone: [phone] Fax: [phone]     Social Determinants of Health (SDOH) Social History: [place]: No Food Insecurity (09/08/2022)  Housing: Low Risk  (09/08/2022)  Transportation Needs: No Transportation Needs (09/08/2022)  Utilities: Not At Risk (09/08/2022)  Alcohol Screen: Low Risk  (07/12/2019)  Depression (PHQ2-9): High Risk (09/09/2022)  Financial Resource Strain: Low Risk  (09/08/2022)  Physical Activity: Unknown (09/08/2022)  Social Connections: Unknown (09/08/2022)  Stress: No Stress Concern Present (09/08/2022)  Tobacco Use: Low Risk  (10/02/2022)   SDOH Interventions:     Readmission Risk Interventions     No data to display         Beckey Rutter, MSW, LCSWA, LCASA Transitions of Care  Clinical Social Worker I

## 2022-10-06 NOTE — Progress Notes (Signed)
[facility] Progress Note  Patient: Kaitlin Hamilton    [id number]  DOA: 10/01/2022    Date of Service: the patient was seen and examined on 10/06/2022  Brief hospital course: 72 y.o. female with past medical history significant for UIP fibrosis, chronic hypoxemic respiratory failure on 9 L O2, OSA on CPAP, type 2 diabetes, hypertension, diastolic heart failure, and hypothyroidism.  As patient's condition has progressed, in the last few months, pulmonary has started discussions with patient about palliative care.  Patient had noted over the last few weeks, that even with mild exertion, she gets significantly short of breath and noted to be hypoxic.  She presented to the emergency room on 3/13 with complaints of the same.  Workup did not reveal any immediate acute findings such as heart failure or acute infection.  Patient not tachycardic or afebrile and oxygen saturations in the high 90s on her baseline of 9 L, however with any type of exertion, oxygen saturations dropped into the 60s.  Hospitalist were called in for further evaluation.  Palliative care consulted.  While patient has had some improvement, still with significant oxygen desaturation with ambulation.  Evaluating for potential skilled nursing.  Have started p.o. Lasix   Assessment and Plan: * ILD (interstitial lung disease) ([place]) She is currently on Cellcept, steroids, and Verapamil  Acute on chronic respiratory failure with hypoxemia ([place]) I suspect this is more of a worsening of her underlying condition rather than an acute reversible cause.  Given her advanced degree of respiratory failure even before her worsening hypoxia, she would be very appropriate for palliative care, who have been consulted.    That said, patient does report orthostasis.  She had been started on Lasix several months ago, but had had it adjusted and changed to as needed due to issues with hypokalemia.  Given her need for prednisone leading to chronic  swelling, chronic shortness of breath, it may be difficult for her to ascertain acute need for Lasix as needed.  Received 2 doses of IV Lasix and diuresed over a liter.  Patient states breathing is a bit more comfortable and daughter observes that with mild activity, she does not have significant oxygen desaturation.    Seen by PT who are recommending skilled nursing.  Metabolic alkalosis with respiratory acidosis After IV Lasix, patient has diuresed approximately 1 L.  Noted to have an increased bicarb level of 41.  ABG with pH 7.5, pCO2 of 63 and bicarb of 49.  Held off on additional Lasix for concerns of diuresis causing respiratory failure.  Fortunately, patient is feeling better.  By following day, bicarb level has come down.  Started p.o. Lasix 3/17 every other day  Anxiety Breathing a bit easier, but still feels anxious.  Will increase as needed Xanax.  In discussion with patient and her daughter, patient admits that sometimes after her morning dose, she will hesitate to take 1 in the afternoon, because she knows that she will need 1 at night.  Will plan to increase to 4 times a day as needed, but also, encourage patient to take her Xanax especially in the morning before she has a heavily exertioning event knowing that that will cause her to feel more anxious.  We also discussed additional strategies to help with her anxiety.  Patient likes to wear continuous pulse ox and constantly frets when her oxygen saturations go down with exertion.  After some discussion, she is willing to try putting away her pulse ox and only checking  it if her breathing becomes worse and does not improve after several minutes.  We also discussed calming down and breathing exercises.  Hypomagnesemia Replacing as needed  Hypokalemia Replacing as needed.  Low on admission 2.6, probably from attempting to use Lasix at home.  Chronic heart failure with preserved ejection fraction (HFpEF) (HCC) Echocardiogram done in  Oct of 2023 notes preserved ejection fraction and grade 1 diastolic dysfunction  HTN (hypertension) Blood pressure stable  Non-insulin dependent type 2 diabetes mellitus (HCC) CBG's stable, sliding scale  Normocytic anemia Secondary to her chronic disease.  Slightly lower than previous baseline, but no indications for acute bleed  Hypothyroidism Continue synthroid  Obesity (BMI 30-39.9) Meets criteria with BMI >30       Body mass index is 33.38 kg/m.        Consultants: Pulmonary Palliative care  Procedures: None  Antimicrobials: None  Code Status: DNR   Subjective: Feeling a little bit better, less anxious, oxygen desaturations with ambulation  Objective:  Vitals:   10/06/22 0836 10/06/22 1624  BP: (!) 117/52 (!) 109/59  Pulse: 77 73  Resp: 18 18  Temp: 97.7 F (36.5 C) 97.7 F (36.5 C)  SpO2: 100% 100%    Intake/Output Summary (Last 24 hours) at 10/06/2022 1704 Last data filed at 10/06/2022 0735 Gross per 24 hour  Intake 480 ml  Output 400 ml  Net 80 ml   Filed Weights   10/04/22 0020 10/05/22 0447 10/06/22 0327  Weight: 95.8 kg 93.2 kg 93.8 kg   Body mass index is 33.38 kg/m.  Exam:  General: Alert and oriented x 3, less fatigued HEENT: Normocephalic, atraumatic, mucous membranes are slightly dry Cardiovascular: Regular rate and rhythm, no ectopic beats Respiratory: Decreased breath sounds throughout, better inspiratory effort Abdomen: Soft, nontender, nondistended, positive bowel sounds Musculoskeletal: No clubbing or cyanosis, no pitting edema Skin: No skin breaks, tears or lesions Psychiatry: Appropriate, no evidence of psychoses, mildly anxious Neurology: No focal deficits  Data Reviewed: Bicarb 33  Disposition:  Status is: Inpatient    Anticipated discharge date: 3/19  Remaining issues to be resolved so that patient can be discharged:  -Skilled nursing   Family Communication: Will call daughter DVT  Prophylaxis: enoxaparin (LOVENOX) injection 40 mg Start: 10/02/22 1400    Author: Annita Brod ,MD 10/06/2022 5:04 PM  To reach On-call, see care teams to locate the attending and reach out via [email or web address]. Between 7PM-7AM, please contact night-coverage If you still have difficulty reaching the attending provider, please page the [facility] (Director on Call) for [facility] on amion for assistance.

## 2022-10-06 NOTE — Plan of Care (Signed)

## 2022-10-07 DIAGNOSIS — F419 Anxiety disorder, unspecified: Secondary | ICD-10-CM | POA: Diagnosis not present

## 2022-10-07 DIAGNOSIS — J849 Interstitial pulmonary disease, unspecified: Secondary | ICD-10-CM | POA: Diagnosis not present

## 2022-10-07 DIAGNOSIS — E874 Mixed disorder of acid-base balance: Secondary | ICD-10-CM | POA: Diagnosis not present

## 2022-10-07 DIAGNOSIS — J9621 Acute and chronic respiratory failure with hypoxia: Secondary | ICD-10-CM | POA: Diagnosis not present

## 2022-10-07 LAB — GLUCOSE, CAPILLARY
Glucose-Capillary: 142 mg/dL — ABNORMAL HIGH (ref 70–99)
Glucose-Capillary: 238 mg/dL — ABNORMAL HIGH (ref 70–99)
Glucose-Capillary: 336 mg/dL — ABNORMAL HIGH (ref 70–99)
Glucose-Capillary: 224 mg/dL — ABNORMAL HIGH (ref 70–99)

## 2022-10-07 LAB — EXPECTORATED SPUTUM ASSESSMENT W GRAM STAIN, RFLX TO RESP C

## 2022-10-07 MED ORDER — PREDNISONE 50 MG PO TABS
60.0000 mg | ORAL_TABLET | Freq: Every day | ORAL | Status: DC
Start: 2022-10-08 — End: 2022-10-11
  Administered 2022-10-08 – 2022-10-11 (×4): 60 mg via ORAL
  Filled 2022-10-07 (×4): qty 1

## 2022-10-07 NOTE — Progress Notes (Signed)
Occupational Therapy Treatment Patient Details Name: Kaitlin Hamilton MRN: [id number] DOB: 01/02/1951 Today's Date: 10/07/2022   History of present illness 72 y.o. female presents to [facility] hospital on 10/01/2022 with DOE and hypoxia. With any exertion pt sats dropping to 60s while on her baseline 9L [place]. PMH includes ILD, chronic respiratory failure with hypoxemia, DMII, HTN, diastolic HF.   OT comments  Pt continuing to progress towards patient focused goals. Pt currently completing sit>stands with Min guard assist, ambulated in hall with Min guard assist + RW, Pt did desat in hall to 87% Sp02 but this appears to be an improvement from previous OT note. Pt completed toilet t/f with Min guard assist but complete sit>stand from toilet with very close SBA + RW and use of rail, patient did desat on toilet to 79% SpO2. Patient demonstrated good use of pursed lip breathing techniques. Patient would benefit from continued skilled acute OT to address above deficits and help transition to next level of care. Pt remains appropriate for skilled OT services that offers 24/7 support and assist, Pt has the potential to progress to an intensive rehab program that provides 3 hours of skilled therapy if she continues to progress medically and functionally.    Recommendations for follow up therapy are one component of a multi-disciplinary discharge planning process, led by the attending physician.  Recommendations may be updated based on patient status, additional functional criteria and insurance authorization.    Follow Up Recommendations  Skilled nursing-short term rehab (<3 hours/day)     Assistance Recommended at Discharge Frequent or constant Supervision/Assistance  Patient can return home with the following  A lot of help with walking and/or transfers;A lot of help with bathing/dressing/bathroom;Assistance with cooking/housework;Assist for transportation;Help with stairs or ramp for entrance   Equipment  Recommendations   (TBD at next level of care)    Recommendations for Other Services      Precautions / Restrictions Precautions Precautions: Fall Precaution Comments: monitor sats, desats on 9L Restrictions Weight Bearing Restrictions: No       Mobility Bed Mobility               General bed mobility comments: Pt rec'd in chair    Transfers Overall transfer level: Needs assistance Equipment used: Rolling walker (2 wheels) Transfers: Sit to/from Stand Sit to Stand: Min guard                 Balance Overall balance assessment: Needs assistance Sitting-balance support: Feet supported Sitting balance-Leahy Scale: Good Sitting balance - Comments: supported sitting in recliner   Standing balance support: Bilateral upper extremity supported, During functional activity, Reliant on assistive device for balance Standing balance-Leahy Scale: Poor Standing balance comment: During functional ambulation it is aparent that Pt needs RW for support to maintain balance when walking                           ADL either performed or assessed with clinical judgement   ADL Overall ADL's : Needs assistance/impaired                         Toilet Transfer: Min guard;Ambulation;Regular Toilet;Rolling walker (2 wheels) Toilet Transfer Details (indicate cue type and reason): cues for hand placement and safety Toileting- Clothing Manipulation and Hygiene: Supervision/safety;Sitting/lateral lean Toileting - Clothing Manipulation Details (indicate cue type and reason): Pt completed pericare while seated on toilet, close supervision     Functional  mobility during ADLs: Min guard      Extremity/Trunk Assessment Upper Extremity Assessment Upper Extremity Assessment: Generalized weakness   Lower Extremity Assessment Lower Extremity Assessment: Defer to PT evaluation   Cervical / Trunk Assessment Cervical / Trunk Assessment: Other exceptions Cervical / Trunk  Exceptions: excess body habitus    Vision   Vision Assessment?: No apparent visual deficits   Perception     Praxis      Cognition Arousal/Alertness: Awake/alert Behavior During Therapy: WFL for tasks assessed/performed Overall Cognitive Status: Within Functional Limits for tasks assessed                                          Exercises      Shoulder Instructions       General Comments Pt desat to 79% Sp02 while on toilet, desat to 87% in hallway during ambulation which returned following pursed lip breathing, 90% Sp02 at end of therpy session while resting in chair.    Pertinent Vitals/ Pain       Pain Assessment Pain Assessment: No/denies pain  Home Living                                          Prior Functioning/Environment              Frequency  Min 2X/week        Progress Toward Goals  OT Goals(current goals can now be found in the care plan section)  Progress towards OT goals: Progressing toward goals  Acute Rehab OT Goals Patient Stated Goal: Hopeful to return home OT Goal Formulation: With patient Time For Goal Achievement: 10/19/22 Potential to Achieve Goals: [place] Frequency remains appropriate;Discharge plan remains appropriate    Co-evaluation                 AM-PAC OT "6 Clicks" Daily Activity     Outcome Measure   Help from another person eating meals?: None Help from another person taking care of personal grooming?: A Little Help from another person toileting, which includes using toliet, bedpan, or urinal?: A Little Help from another person bathing (including washing, rinsing, drying)?: A Lot Help from another person to put on and taking off regular upper body clothing?: A Little Help from another person to put on and taking off regular lower body clothing?: A Lot 6 Click Score: 17    End of Session Equipment Utilized During Treatment: Gait belt;Oxygen (9L, 10L with  ambulation)  OT Visit Diagnosis: Unsteadiness on feet (R26.81);Other abnormalities of gait and mobility (R26.89);Muscle weakness (generalized) (M62.81)   Activity Tolerance Patient limited by fatigue;Treatment limited secondary to medical complications (Comment) (Sp02)   Patient Left in chair;with call bell/phone within reach;with family/visitor present   Nurse Communication Mobility status;Other (comment) (SpO2)        Time: [id number] OT Time Calculation (min): 24 min  Charges: OT General Charges $OT Visit: 1 Visit OT Treatments $Self Care/Home Management : 8-22 mins $Therapeutic Activity: 8-22 mins  10/07/2022  AB, OTR/L  Acute Rehabilitation Services  Office: [phone]   Cori Razor 10/07/2022, 5:03 PM

## 2022-10-07 NOTE — Progress Notes (Signed)
Inpatient Rehab Admissions Coordinator:   Per [facility] request, patient was screened for CIR candidacy by Clemens Catholic, MS, CCC-SLP  . At this time, Pt. is desaturating to 60s with exertion on 9L of oxygen. I do not think she could tolerate an intensive rehab program from a respiratory standpoint. Additionally, Pt.  Appears near her baseline functionally. I will not place rehab consult at this time.  Please contact me with any questions.   Clemens Catholic, [place], [place] Admissions Coordinator  [phone] ([place]) [phone] (office)

## 2022-10-07 NOTE — Progress Notes (Signed)
[facility] Progress Note  Patient: Kaitlin Hamilton    [id number]  DOA: 10/01/2022    Date of Service: the patient was seen and examined on 10/07/2022  Brief hospital course: 72 y.o. female with past medical history significant for UIP fibrosis, chronic hypoxemic respiratory failure on 9 L O2, OSA on CPAP, type 2 diabetes, hypertension, diastolic heart failure, and hypothyroidism.  As patient's condition has progressed, in the last few months, pulmonary has started discussions with patient about palliative care.  Patient had noted over the last few weeks, that even with mild exertion, she gets significantly short of breath and noted to be hypoxic.  She presented to the emergency room on 3/13 with complaints of the same.  Workup did not reveal any immediate acute findings such as heart failure or acute infection.  Patient not tachycardic or afebrile and oxygen saturations in the high 90s on her baseline of 9 L, however with any type of exertion, oxygen saturations dropped into the 60s.  Hospitalist were called in for further evaluation.  Palliative care consulted.  While patient has had some improvement, still with significant oxygen desaturation with ambulation.  Placed on scheduled p.o. Lasix.  Skilled nursing will not take patient because baseline oxygen to high (their cutoff is 6 L).  Looking at [facility] or CIR if possible   Assessment and Plan: * ILD (interstitial lung disease) ([place]) She is currently on Cellcept, steroids, and Verapamil.  Discussed with pulmonary.  Change IV steroids to p.o. prednisone 60 mg and decrease by 10 mg every week.  Patient has outpatient appointment with pulmonary already scheduled on 3/29.  Acute on chronic respiratory failure with hypoxemia (HCC) I suspect this is more of a worsening of her underlying condition rather than an acute reversible cause.  Given her advanced degree of respiratory failure even before her worsening hypoxia, she would be very appropriate for  palliative care, who have been consulted.    That said, patient does report orthostasis.  She had been started on Lasix several months ago, but had had it adjusted and changed to as needed due to issues with hypokalemia.  Given her need for prednisone leading to chronic swelling, chronic shortness of breath, it may be difficult for her to ascertain acute need for Lasix as needed.  Received 2 doses of IV Lasix and diuresed over a liter.  Patient states breathing is a bit more comfortable and daughter observes that with mild activity, she does not have significant oxygen desaturation.    Unable to go to skilled nursing, LTAC or CIR.  Does not meet criteria.  Will have to go home with home health.  Metabolic alkalosis with respiratory acidosis After IV Lasix, patient has diuresed approximately 1 L.  Noted to have an increased bicarb level of 41.  ABG with pH 7.5, pCO2 of 63 and bicarb of 49.  Held off on additional Lasix for concerns of diuresis causing respiratory failure.  Fortunately, patient is feeling better.  By following day, bicarb level has come down.  Started p.o. Lasix 3/17 every other day  Anxiety Breathing a bit easier, but still feels anxious.  Will increase as needed Xanax.  In discussion with patient and her daughter, patient admits that sometimes after her morning dose, she will hesitate to take 1 in the afternoon, because she knows that she will need 1 at night.  Increased to 4 times a day as needed, but also, encourage patient to take her Xanax especially in the morning before  she has a heavily exertioning event knowing that that will cause her to feel more anxious.  At times, patient does not remember to ask for it in the morning, so changed Xanax to scheduled morning and evening and then 2 additional doses as needed during the day.  We also discussed additional strategies to help with her anxiety.  Patient likes to wear continuous pulse ox and constantly frets when her oxygen  saturations go down with exertion.  After some discussion, she is willing to try putting away her pulse ox and only checking it if her breathing becomes worse and does not improve after several minutes.  We also discussed calming down and breathing exercises.  Hypomagnesemia Replacing as needed  Hypokalemia Replacing as needed.  Low on admission 2.6, probably from attempting to use Lasix at home.  Chronic heart failure with preserved ejection fraction (HFpEF) (HCC) Echocardiogram done in Oct of 2023 notes preserved ejection fraction and grade 1 diastolic dysfunction  HTN (hypertension) Blood pressure stable  Non-insulin dependent type 2 diabetes mellitus (HCC) CBG's stable, sliding scale  Normocytic anemia Secondary to her chronic disease.  Slightly lower than previous baseline, but no indications for acute bleed  Hypothyroidism Continue synthroid  Obesity (BMI 30-39.9) Meets criteria with BMI >30       Body mass index is 33.17 kg/m.        Consultants: Pulmonary Palliative care  Procedures: None  Antimicrobials: None  Code Status: DNR   Subjective: Slept better, anxiety little better  Objective:  Vitals:   10/07/22 1214 10/07/22 1651  BP: (!) 129/93   Pulse: 94   Resp: 18   Temp: 97.6 F (36.4 C)   SpO2: 90% 90%    Intake/Output Summary (Last 24 hours) at 10/07/2022 1833 Last data filed at 10/07/2022 1400 Gross per 24 hour  Intake --  Output 1100 ml  Net -1100 ml   Filed Weights   10/05/22 0447 10/06/22 0327 10/07/22 0606  Weight: 93.2 kg 93.8 kg 93.2 kg   Body mass index is 33.17 kg/m.  Exam:  General: Alert and oriented x 3, less fatigued HEENT: Normocephalic, atraumatic, mucous membranes are slightly dry Cardiovascular: Regular rate and rhythm, no ectopic beats Respiratory: Decreased breath sounds throughout, better inspiratory effort Abdomen: Soft, nontender, nondistended, positive bowel sounds Musculoskeletal: No clubbing or  cyanosis, no pitting edema Skin: No skin breaks, tears or lesions Psychiatry: Appropriate, no evidence of psychoses, mildly anxious Neurology: No focal deficits  Data Reviewed: CBG stable  Disposition:  Status is: Inpatient    Anticipated discharge date: 3/20  Remaining issues to be resolved so that patient can be discharged:  Disposition determination.  Will have to go home with home health   Family Communication: Will call daughter DVT Prophylaxis: enoxaparin (LOVENOX) injection 40 mg Start: 10/02/22 1400    Author: Annita Brod ,MD 10/07/2022 6:33 PM  To reach On-call, see care teams to locate the attending and reach out via [email or web address]. Between 7PM-7AM, please contact night-coverage If you still have difficulty reaching the attending provider, please page the [facility] (Director on Call) for [facility] on amion for assistance.

## 2022-10-07 NOTE — Progress Notes (Signed)
[facility] Progress Note  Patient: Kaitlin Hamilton    [id number]  DOA: 10/01/2022    Date of Service: the patient was seen and examined on 10/07/2022  Brief hospital course: 72 y.o. female with past medical history significant for UIP fibrosis, chronic hypoxemic respiratory failure on 9 L O2, OSA on CPAP, type 2 diabetes, hypertension, diastolic heart failure, and hypothyroidism.  As patient's condition has progressed, in the last few months, pulmonary has started discussions with patient about palliative care.  Patient had noted over the last few weeks, that even with mild exertion, she gets significantly short of breath and noted to be hypoxic.  She presented to the emergency room on 3/13 with complaints of the same.  Workup did not reveal any immediate acute findings such as heart failure or acute infection.  Patient not tachycardic or afebrile and oxygen saturations in the high 90s on her baseline of 9 L, however with any type of exertion, oxygen saturations dropped into the 60s.  Hospitalist were called in for further evaluation.  Palliative care consulted.  While patient has had some improvement, still with significant oxygen desaturation with ambulation.  Placed on scheduled p.o. Lasix.  Skilled nursing will not take patient because baseline oxygen to high (their cutoff is 6 L).  Looking at [facility] or CIR if possible   Assessment and Plan: * ILD (interstitial lung disease) ([place]) She is currently on Cellcept, steroids, and Verapamil  Acute on chronic respiratory failure with hypoxemia ([place]) I suspect this is more of a worsening of her underlying condition rather than an acute reversible cause.  Given her advanced degree of respiratory failure even before her worsening hypoxia, she would be very appropriate for palliative care, who have been consulted.    That said, patient does report orthostasis.  She had been started on Lasix several months ago, but had had it adjusted and changed to as  needed due to issues with hypokalemia.  Given her need for prednisone leading to chronic swelling, chronic shortness of breath, it may be difficult for her to ascertain acute need for Lasix as needed.  Received 2 doses of IV Lasix and diuresed over a liter.  Patient states breathing is a bit more comfortable and daughter observes that with mild activity, she does not have significant oxygen desaturation.    Unable to go to skilled nursing.  LTAC versus CIR if available.  Metabolic alkalosis with respiratory acidosis After IV Lasix, patient has diuresed approximately 1 L.  Noted to have an increased bicarb level of 41.  ABG with pH 7.5, pCO2 of 63 and bicarb of 49.  Held off on additional Lasix for concerns of diuresis causing respiratory failure.  Fortunately, patient is feeling better.  By following day, bicarb level has come down.  Started p.o. Lasix 3/17 every other day  Anxiety Breathing a bit easier, but still feels anxious.  Will increase as needed Xanax.  In discussion with patient and her daughter, patient admits that sometimes after her morning dose, she will hesitate to take 1 in the afternoon, because she knows that she will need 1 at night.  Increased to 4 times a day as needed, but also, encourage patient to take her Xanax especially in the morning before she has a heavily exertioning event knowing that that will cause her to feel more anxious.  At times, patient does not remember to ask for it in the morning, so changed Xanax to scheduled morning and evening and then 2 additional  doses as needed during the day.  We also discussed additional strategies to help with her anxiety.  Patient likes to wear continuous pulse ox and constantly frets when her oxygen saturations go down with exertion.  After some discussion, she is willing to try putting away her pulse ox and only checking it if her breathing becomes worse and does not improve after several minutes.  We also discussed calming down and  breathing exercises.  Hypomagnesemia Replacing as needed  Hypokalemia Replacing as needed.  Low on admission 2.6, probably from attempting to use Lasix at home.  Chronic heart failure with preserved ejection fraction (HFpEF) (HCC) Echocardiogram done in Oct of 2023 notes preserved ejection fraction and grade 1 diastolic dysfunction  HTN (hypertension) Blood pressure stable  Non-insulin dependent type 2 diabetes mellitus (HCC) CBG's stable, sliding scale  Normocytic anemia Secondary to her chronic disease.  Slightly lower than previous baseline, but no indications for acute bleed  Hypothyroidism Continue synthroid  Obesity (BMI 30-39.9) Meets criteria with BMI >30       Body mass index is 33.17 kg/m.        Consultants: Pulmonary Palliative care  Procedures: None  Antimicrobials: None  Code Status: DNR   Subjective: Slept a little better.  Objective:  Vitals:   10/07/22 0714 10/07/22 1214  BP:  (!) 129/93  Pulse:  94  Resp:  18  Temp: 97.6 F (36.4 C) 97.6 F (36.4 C)  SpO2:  90%    Intake/Output Summary (Last 24 hours) at 10/07/2022 1351 Last data filed at 10/07/2022 [id number] Gross per 24 hour  Intake 240 ml  Output 800 ml  Net -560 ml   Filed Weights   10/05/22 0447 10/06/22 0327 10/07/22 0606  Weight: 93.2 kg 93.8 kg 93.2 kg   Body mass index is 33.17 kg/m.  Exam:  General: Alert and oriented x 3, less fatigued HEENT: Normocephalic, atraumatic, mucous membranes are slightly dry Cardiovascular: Regular rate and rhythm, no ectopic beats Respiratory: Decreased breath sounds throughout, better inspiratory effort Abdomen: Soft, nontender, nondistended, positive bowel sounds Musculoskeletal: No clubbing or cyanosis, no pitting edema Skin: No skin breaks, tears or lesions Psychiatry: Appropriate, no evidence of psychoses, mildly anxious Neurology: No focal deficits  Data Reviewed: CBG stable  Disposition:  Status is: Inpatient     Anticipated discharge date: 3/20  Remaining issues to be resolved so that patient can be discharged:  Disposition determination.  If LTAC or CIR cannot take patient, will have to go home with home health   Family Communication: Daughter at bedside DVT Prophylaxis: enoxaparin (LOVENOX) injection 40 mg Start: 10/02/22 1400    Author: Annita Brod ,MD 10/07/2022 1:51 PM  To reach On-call, see care teams to locate the attending and reach out via [email or web address]. Between 7PM-7AM, please contact night-coverage If you still have difficulty reaching the attending provider, please page the [facility] (Director on Call) for [facility] on amion for assistance.

## 2022-10-07 NOTE — TOC Progression Note (Addendum)
Transition of Care [facility]) - Progression Note    Patient Details  Name: Kaitlin Hamilton MRN: [id number] Date of Birth: 03-23-1951  [facility]) CM/SW Contact  Zenon Mayo, RN Phone Number: 10/07/2022, 12:13 PM  Clinical Narrative:    Patient is on 9 liters of oxygen which is her baseline and she can not go to a SNF with that being her baseline, NCM spoke with Raquel Sarna at [facility] to look at for [facility] candidate and also Anderson Malta with [facility].  Also PT /OT will see patient again to screen for CIR.  Patient unfortunately is not a candidate for LTACH or for CIR.  The only other option is to go home with [facility].  NCM spoke with [name], informed  her of the above information.  She states she will go with the [facility] service  and she has no preference for the agency, and she will also look into private duty services.  NCM informed her to look at the [facility] . Gov list for private duty and she can google it also.  Will try [facility], she is ok with Alvis Lemmings , also they have a private duty service they will call daughter to give her information on how private duty works.  Will need orders for HHPT, HHAIDE and Social work. NCM made referral to [facility] with [facility] , he is able to take referral for HHPT, HHAIDE and Social Work.  Soc will begin 24 to 48 hrs post dc.  She will need ambulance transport home per daughter.   Expected Discharge Plan: Skilled Nursing Facility Barriers to Discharge: Continued Medical Work up  Expected Discharge Plan and Services                                               Social Determinants of Health (SDOH) Interventions SDOH Screenings   Food Insecurity: No Food Insecurity (09/08/2022)  Housing: Low Risk  (09/08/2022)  Transportation Needs: No Transportation Needs (09/08/2022)  Utilities: Not At Risk (09/08/2022)  Alcohol Screen: Low Risk  (07/12/2019)  Depression (PHQ2-9): High Risk (09/09/2022)  Financial Resource Strain: Low Risk  (09/08/2022)  Physical  Activity: Unknown (09/08/2022)  Social Connections: Unknown (09/08/2022)  Stress: No Stress Concern Present (09/08/2022)  Tobacco Use: Low Risk  (10/02/2022)    Readmission Risk Interventions     No data to display

## 2022-10-08 DIAGNOSIS — J849 Interstitial pulmonary disease, unspecified: Secondary | ICD-10-CM | POA: Diagnosis not present

## 2022-10-08 DIAGNOSIS — I5032 Chronic diastolic (congestive) heart failure: Secondary | ICD-10-CM | POA: Diagnosis not present

## 2022-10-08 DIAGNOSIS — Z711 Person with feared health complaint in whom no diagnosis is made: Secondary | ICD-10-CM

## 2022-10-08 DIAGNOSIS — I10 Essential (primary) hypertension: Secondary | ICD-10-CM | POA: Diagnosis not present

## 2022-10-08 DIAGNOSIS — Z66 Do not resuscitate: Secondary | ICD-10-CM

## 2022-10-08 DIAGNOSIS — Z789 Other specified health status: Secondary | ICD-10-CM

## 2022-10-08 DIAGNOSIS — E876 Hypokalemia: Secondary | ICD-10-CM | POA: Diagnosis not present

## 2022-10-08 DIAGNOSIS — D649 Anemia, unspecified: Secondary | ICD-10-CM

## 2022-10-08 DIAGNOSIS — E039 Hypothyroidism, unspecified: Secondary | ICD-10-CM

## 2022-10-08 LAB — GLUCOSE, CAPILLARY
Glucose-Capillary: 145 mg/dL — ABNORMAL HIGH (ref 70–99)
Glucose-Capillary: 292 mg/dL — ABNORMAL HIGH (ref 70–99)
Glucose-Capillary: 386 mg/dL — ABNORMAL HIGH (ref 70–99)
Glucose-Capillary: 249 mg/dL — ABNORMAL HIGH (ref 70–99)

## 2022-10-08 MED ORDER — INSULIN ASPART 100 UNIT/ML IJ SOLN: 0.0000 [IU] | Freq: Three times a day (TID) | INTRAMUSCULAR | Status: DC

## 2022-10-08 MED ORDER — IPRATROPIUM-ALBUTEROL 0.5-2.5 (3) MG/3ML IN SOLN: 3.0000 mL | RESPIRATORY_TRACT | Status: DC | PRN

## 2022-10-08 MED ORDER — MOMETASONE FURO-FORMOTEROL FUM 200-5 MCG/ACT IN AERO
2.0000 | INHALATION_SPRAY | Freq: Two times a day (BID) | RESPIRATORY_TRACT | Status: DC
  Administered 2022-10-08 – 2022-10-11 (×7): 2 via RESPIRATORY_TRACT
  Filled 2022-10-08: qty 8.8

## 2022-10-08 MED ORDER — FUROSEMIDE 20 MG PO TABS
20.0000 mg | ORAL_TABLET | Freq: Every day | ORAL | Status: DC
  Administered 2022-10-09 – 2022-10-11 (×3): 20 mg via ORAL
  Filled 2022-10-08 (×3): qty 1

## 2022-10-08 MED ORDER — IPRATROPIUM-ALBUTEROL 0.5-2.5 (3) MG/3ML IN SOLN
3.0000 mL | Freq: Four times a day (QID) | RESPIRATORY_TRACT | Status: DC
  Administered 2022-10-08 – 2022-10-11 (×12): 3 mL via RESPIRATORY_TRACT
  Filled 2022-10-08 (×11): qty 3

## 2022-10-08 NOTE — Progress Notes (Addendum)
Progress Note   Patient: Kaitlin Hamilton [id number] DOB: Oct 20, 1950 DOA: 10/01/2022     6 DOS: the patient was seen and examined on 10/08/2022   Brief hospital course: Mrs. Sabey was admitted to the hospital with the working diagnosis of acute on chronic hypoxemic respiratory failure.   72 y.o. female with past medical history significant for UIP fibrosis, chronic hypoxemic respiratory failure on 9 L O2, OSA on CPAP, type 2 diabetes, hypertension, diastolic heart failure, and hypothyroidism.  As patient's condition has progressed, in the last few months, pulmonary has started discussions with patient about palliative care.  Patient had noted over the last few weeks, that even with mild exertion, she gets significantly short of breath and noted to be hypoxic. On 03/05 patient contacted Pulmonary due to rapid worsening dyspnea along with purulent sputum and fever. She had azithromycin prescribed and her rituximab dose was held.  After completing antibiotic she continue to have symptoms and Augmentin was called, that she was not able to take due to GI symptoms. The day prior admission she had levofloxacin prescribed.   On her initial physical examination her blood pressure was 114/55, HR 89, RR 26 and 02 saturation 98% on supplemental 02 per [place], lungs with rales at bases, increased work of breathing, heart with S1 and S2 present and rhythmic, abdomen with no distention and no lower extremity edema.    Na 138, K 2,4 Cl 85 bicarbonate 39, glucose 110, bun 8 cr 0,52  Wbc 10,6 hgb 9.9 plt 402   Chest radiograph with mild cardiomegaly, with bilateral hilar vascular congestion, increase interstitial infiltrates at the left lower lobe.  CT chest with bilateral ground glass opacities, interlobular and intralobular septal thickening and traction bronchiectasis, honeycombing at bases, consistent with UIP.   EKG 91 bpm, normal axis, normal qtc, atrial fibrillation rhythm with no significant ST segment or T wave  changes.   Patient with any type of exertion, oxygen saturations dropped into the 60s.    Palliative care consulted.  While patient has had some improvement, still with significant oxygen desaturation with ambulation.  Placed on scheduled p.o. Lasix.  Skilled nursing will not take patient because baseline oxygen to high (their cutoff is 6 L).  Not candidate for  LTAC or CIR if possible  Patient with rapid progressive disease, very poor prognosis, will call hospice.  Possible candidate for residential hospice.   Assessment and Plan: * ILD (interstitial lung disease) ([place]) Acute on chronic hypoxemic respiratory failure.   Patient with progressive disease and worsening respiratory failure.  Today her 02 saturation is 99% on 9 L/min per [place].  Very limited mobility due to dyspnea.  Patient will follow up with palliative care as outpatient.   She is currently on Cellcept, steroids, and Verapamil.  Discussed with pulmonary, recommendations to continue with  p.o. prednisone 60 mg and decrease by 10 mg every week.  Patient has outpatient appointment with pulmonary already scheduled on 3/29.  Patient has completed 5 days of antibiotic therapy with ceftriaxone IV.  Continue with bronchodilator therapy and inhaled steroids.  Continue with mycophenolate and pirfenidone.  Airway clearing techniques with flutter valve and incentive spirometer.   Chronic heart failure with preserved ejection fraction (HFpEF) (HCC) Echocardiogram from 2023 with preserved LV systolic function EF 60 to 65%, RV with preserved systolic function, RVSP [id number] mmHg. Mild dilatated LA, no significant valvular disease.   Patient had diuresis during her hospitalization with furosemide.  Contraction metabolic alkalosis.   Hypokalemia Hypomagnesemia. Hyponatremia.  Renal function has been stable, with serum cr today at 0,72 with K at 4,0 and serum bicarbonate at 33.  Na 133.     HTN (hypertension) Continue blood pressure  control with losartan and verapamil.   Dyslipidemia, continue with statin therapy.   Non-insulin dependent type 2 diabetes mellitus ([place]) Continue glucose cover and monitoring with insulin sliding scale.   Anxiety Alprazolam increased to 4 times a day as needed  Discussed additional strategies to help with her anxiety.    Normocytic anemia Secondary to her chronic disease.  Slightly lower than previous baseline, but no indications for acute bleed  Hypothyroidism Continue synthroid  Obesity (BMI 30-39.9) Calculated BMI is 33,2 consistent with obesity class 1.  OSA.       Subjective: Patient is seating at the chair at the side of the bed, dyspnea is stable, she is on high rate 02 supplementation per [place], no chest pain, no edema, continue to have significant dyspnea on exertion.   Physical Exam: Vitals:   10/08/22 0015 10/08/22 0048 10/08/22 0449 10/08/22 0800  BP: (!) 120/59  (!) 114/58 110/65  Pulse: 63  63 71  Resp: 19  18 18   Temp: (!) 97.5 F (36.4 C)  97.9 F (36.6 C) 97.8 F (36.6 C)  TempSrc: Oral  Oral Oral  SpO2: 100%  98% 99%  Weight:  93.4 kg    Height:       Neurology awake and alert ENT with mild pallor Cardiovascular with S1 and S2 present and rhythmic with no gallops, rubs or murmurs No JVD No lower extremity edema Respiratory with rales bilaterally at bases with no wheezing or rhonchi Abdomen with no distention.  Data Reviewed:    Family Communication: no family at the bedside. I spoke with patient's daughter over the phone, we talked in detail about patient's condition, plan of care and prognosis and all questions were addressed.   Disposition: Status is: Inpatient Remains inpatient appropriate because: respiratory failure, possible discharge home tomorrow with home health services and palliative care.   Planned Discharge Destination: Home      Author: Tawni Millers, MD 10/08/2022 10:31 AM  For on call review  [email or web address].

## 2022-10-08 NOTE — Progress Notes (Signed)
Physical Therapy Treatment Patient Details Name: Kaitlin Hamilton MRN: [id number] DOB: Oct 26, 1950 Today's Date: 10/08/2022   History of Present Illness 72 y.o. female presents to [facility] hospital on 10/01/2022 with DOE and hypoxia. With any exertion pt sats dropping to 60s while on her baseline 9L [place]. PMH includes ILD, chronic respiratory failure with hypoxemia, DMII, HTN, diastolic HF.    PT Comments    Pt with some improvement in activity tolerance this session, ambulating on 10L HFNC. Pt maintains sats in 80s throughout session, taking multiple brief standing rest breaks in an effort to recover and to avoid a greater magnitude of desaturation. Pt does continue to fatigue quickly, but recovers more quickly than previous PT session with this therapist. Of note pt reports having a breathing treatment prior to session. Pt does not qualify for SNF due to oxygen needs. Pt is also not an ideal candidate for AIR due to progressive nature of ILD along with poor activity tolerance. PT recommends discharge home with HHPT. Pt reports her family are attempting to arrange sufficient caregiver support.   Recommendations for follow up therapy are one component of a multi-disciplinary discharge planning process, led by the attending physician.  Recommendations may be updated based on patient status, additional functional criteria and insurance authorization.  Follow Up Recommendations  Home health PT (SNF, AIR not an option. will benefit from HHPT if in alignment with patient goals of care. Pt reports famiyl are attempting to arrange caregiver support) Can patient physically be transported by private vehicle: No   Assistance Recommended at Discharge Intermittent Supervision/Assistance  Patient can return home with the following A little help with walking and/or transfers;A little help with bathing/dressing/bathroom;Assistance with cooking/housework;Assist for transportation;Help with stairs or ramp for entrance    Equipment Recommendations  Rolling walker (2 wheels);BSC/3in1    Recommendations for Other Services       Precautions / Restrictions Precautions Precautions: Fall Precaution Comments: monitor sats, desats on 9L Restrictions Weight Bearing Restrictions: No     Mobility  Bed Mobility                    Transfers Overall transfer level: Needs assistance Equipment used: Rolling walker (2 wheels) Transfers: Sit to/from Stand Sit to Stand: Supervision                Ambulation/Gait Ambulation/Gait assistance: Min guard Gait Distance (Feet): 80 Feet Assistive device: Rolling walker (2 wheels) Gait Pattern/deviations: Step-to pattern, Wide base of support Gait velocity: reduced Gait velocity interpretation: <1.31 ft/sec, indicative of household ambulator   General Gait Details: slowed step-to gait, multiple brief standing rest breaks for pursed lip breathing in an effort to limit desaturation   Stairs             Wheelchair Mobility    Modified Rankin (Stroke Patients Only)       Balance Overall balance assessment: Needs assistance Sitting-balance support: No upper extremity supported, Feet supported Sitting balance-Leahy Scale: Good     Standing balance support: Bilateral upper extremity supported, Reliant on assistive device for balance Standing balance-Leahy Scale: Poor                              Cognition Arousal/Alertness: Awake/alert Behavior During Therapy: WFL for tasks assessed/performed Overall Cognitive Status: Within Functional Limits for tasks assessed  Exercises      General Comments General comments (skin integrity, edema, etc.): pt on 9L HFNC at rest, 10L with mobility. Pt intermittently desats into 80s this session, lowest being 80%. PT providing cues to ambulate for short bouts broken up by standing rest breaks with pursed lip breathing       Pertinent Vitals/Pain Pain Assessment Pain Assessment: No/denies pain    Home Living                          Prior Function            PT Goals (current goals can now be found in the care plan section) Acute Rehab PT Goals Patient Stated Goal: to increase standing/walking for home Progress towards PT goals: Progressing toward goals    Frequency    Min 2X/week      PT Plan Discharge plan needs to be updated    Co-evaluation              AM-PAC PT "6 Clicks" Mobility   Outcome Measure  Help needed turning from your back to your side while in a flat bed without using bedrails?: A Little Help needed moving from lying on your back to sitting on the side of a flat bed without using bedrails?: A Lot Help needed moving to and from a bed to a chair (including a wheelchair)?: A Little Help needed standing up from a chair using your arms (e.g., wheelchair or bedside chair)?: A Little Help needed to walk in hospital room?: A Little Help needed climbing 3-5 steps with a railing? : A Lot 6 Click Score: 16    End of Session Equipment Utilized During Treatment: Oxygen Activity Tolerance: Patient tolerated treatment well Patient left: with call bell/phone within reach;in chair Nurse Communication: Mobility status PT Visit Diagnosis: Other abnormalities of gait and mobility (R26.89);Muscle weakness (generalized) (M62.81);Difficulty in walking, not elsewhere classified (R26.2)     Time: [id number] PT Time Calculation (min) (ACUTE ONLY): 15 min  Charges:  $Gait Training: 8-22 mins                     Zenaida Niece, PT, DPT Acute Rehabilitation Office [place] 10/08/2022, 12:42 PM

## 2022-10-08 NOTE — Inpatient Diabetes Management (Addendum)
Inpatient Diabetes Program Recommendations  AACE/ADA: New Consensus Statement on Inpatient Glycemic Control (2015)  Target Ranges:  Prepandial:   less than 140 mg/dL      Peak postprandial:   less than 180 mg/dL (1-2 hours)      Critically ill patients:  140 - 180 mg/dL   Lab Results  Component Value Date   GLUCAP 292 (H) 10/08/2022   HGBA1C 6.2 (A) 09/09/2022    Latest Reference Range & Units 10/07/22 06:02 10/07/22 11:50 10/07/22 16:33 10/07/22 21:16 10/08/22 05:50 10/08/22 11:21  Glucose-Capillary 70 - 99 mg/dL 142 (H) 238 (H) 336 (H) 224 (H) 145 (H) 292 (H)  (H): Data is abnormally high  Diabetes history: DM2 Outpatient Diabetes medications: Glucotrol 10 mg qd, Metformin 1 gm bid Current orders for Inpatient glycemic control: Novolog 0-9 units tid, 0-5 units hs, Solumedrol 60 mg IV qd   Inpatient Diabetes Program Recommendations:   While oral DM meds held and on steroids, please consider: -Add Novolog 4-5 units tid meal coverage if eats 50%  Thank you, Kaitlin Roys E. Daisa Stennis, RN, MSN, CDE  Diabetes Coordinator Inpatient Glycemic Control Team Team Pager [phone] (8am-5pm) 10/08/2022 2:03 PM

## 2022-10-08 NOTE — Progress Notes (Addendum)
Brief Palliative Medicine Progress Note:  PMT following peripherally for needs/decline.  Medical records reviewed including progress notes, labs, imaging.   Patient was not approved for SNF rehab due to elevated baseline oxygen needs. She is not a candidate for LTAC or CIR. Plan is for her discharge home with [facility], [place], and Education officer, museum. Patient is already enrolled in outpatient Palliative Care with [facility] and I have updated their liaison on changes to discharge plan. I have recommended outpatient Palliative Care follow up sooner than later after discharge due to her poor prognosis - they can transition patient to hospice care if/when she is ready. Per [facility] liaison, they have already discussed [place] related to transition to hospice vs remain with outpatient Palliative Care for now and will continue to follow up with patient to support her goals.   PMT will continue to follow peripherally. If there are any imminent needs please call the service directly. Family also has PMT contact information should further needs arise.  Thank you for allowing PMT to assist in the care of this patient.  [facility] Palliative Medicine Team Team Phone: [phone] NO CHARGE

## 2022-10-08 NOTE — TOC Progression Note (Signed)
Transition of Care [facility]) - Progression Note    Patient Details  Name: Kaitlin Hamilton MRN: [id number] Date of Birth: [birth year]  Transition of Care [facility]) CM/SW Contact  Zenon Mayo, RN Phone Number: 10/08/2022, 4:26 PM  Clinical Narrative:    Patient is set up with outpatient palliative services with [facility] per [facility] rep.    Expected Discharge Plan: Skilled Nursing Facility Barriers to Discharge: Continued Medical Work up  Expected Discharge Plan and Services                                               Social Determinants of Health (SDOH) Interventions SDOH Screenings   Food Insecurity: No Food Insecurity (09/08/2022)  Housing: Low Risk  (09/08/2022)  Transportation Needs: No Transportation Needs (09/08/2022)  Utilities: Not At Risk (09/08/2022)  Alcohol Screen: Low Risk  (07/12/2019)  Depression (PHQ2-9): High Risk (09/09/2022)  Financial Resource Strain: Low Risk  (09/08/2022)  Physical Activity: Unknown (09/08/2022)  Social Connections: Unknown (09/08/2022)  Stress: No Stress Concern Present (09/08/2022)  Tobacco Use: Low Risk  (10/02/2022)    Readmission Risk Interventions     No data to display

## 2022-10-08 NOTE — Progress Notes (Signed)
[facility] Liaison Note  Received a request from patients [place] for evaluation for [employer]. At this time patient does not meet IPU criteria. This patient is currently receiving outpatient palliative care services with [facility].  Met at bedside with patient, Dorian Pod and another friend to discuss options moving forward for support at home after discharge.  Explored what continued outpatient palliative could provide and what additional support hospice could provide. Answered all questions and left packet with information. Also spoke with daughter [name] by telephone to review same.   At this time, patient and daughter, wish to discuss options going forward and the plan is for [place] to connect tomorrow afternoon for follow-up discussion of how we might best support patient after discharge.  Updated Tomi Bamberger, RN with TOC.  Please call with any hospice or outpatient palliative questions.  Thank you, Margaretmary Eddy, BSN, RN [facility] Liaison [phone]

## 2022-10-09 LAB — GLUCOSE, CAPILLARY
Glucose-Capillary: 155 mg/dL — ABNORMAL HIGH (ref 70–99)
Glucose-Capillary: 321 mg/dL — ABNORMAL HIGH (ref 70–99)
Glucose-Capillary: 301 mg/dL — ABNORMAL HIGH (ref 70–99)
Glucose-Capillary: 241 mg/dL — ABNORMAL HIGH (ref 70–99)

## 2022-10-09 MED ORDER — POLYVINYL ALCOHOL 1.4 % OP SOLN: 1.0000 [drp] | OPHTHALMIC | Status: DC | PRN

## 2022-10-09 NOTE — Progress Notes (Signed)
Mobility Specialist - Progress Note   10/09/22 1400  Mobility  Activity Ambulated with assistance in hallway  Level of Assistance Contact guard assist, steadying assist  Assistive Device Front wheel walker  Distance Ambulated (ft) 80 ft  Activity Response Tolerated well  Mobility Referral Yes  $Mobility charge 1 Mobility    Pt received in recliner and agreeable to mobility. Encouraged PLB throughout and took standing break x3 in hallway. Left in recliner w/ call bell in reach.   [place] Specialist Please contact via SecureChat or Rehab office at [phone]

## 2022-10-09 NOTE — Progress Notes (Signed)
Brief Palliative Medicine Progress Note:  PMT following peripherally for needs/decline.  Medical records reviewed including progress notes, labs, imaging. Notified by [facility] liaison that patient and family have decided to discharge home with hospice services on Saturday 3/23 - TOC and Dr. Cathlean Sauer updated as well. I appreciate [place]/[facility] liaison's continued discussions with family on goals in relation to their hospice vs outpatient Palliative Care services.  PMT will continue to follow peripherally. If there are any imminent needs please call the service directly. Family also has PMT contact information should further needs arise.  Thank you for allowing PMT to assist in the care of this patient.  Kaitlin Hamilton M. Kaitlin Hamilton [facility] Palliative Medicine Team Team Phone: [phone] NO CHARGE

## 2022-10-09 NOTE — TOC Progression Note (Addendum)
Transition of Care [facility]) - Progression Note    Patient Details  Name: MISHELLE HRONCICH MRN: [id number] Date of Birth: [birth year]  Transition of Care [facility]) CM/SW Contact  Zenon Mayo, RN Phone Number: 10/09/2022, 1:16 PM  Clinical Narrative:    NCM received call from daughter, [name], she states they have decided to go with [facility], and will need ambulance transport home at dc.  She states it will be better if she could be dc on Saturday when daughter can be there and her support systems.  NCM is awaiting to hear from [place] regarding DME that patient may need as well.  NCM confirmed the address patient will be going to. NCM notified [place] with [place]. [place] will supply walker and BSC for patient.  Plan for dc on Saturday.    Expected Discharge Plan: Skilled Nursing Facility Barriers to Discharge: Continued Medical Work up  Expected Discharge Plan and Services                                               Social Determinants of Health (SDOH) Interventions SDOH Screenings   Food Insecurity: No Food Insecurity (09/08/2022)  Housing: Low Risk  (09/08/2022)  Transportation Needs: No Transportation Needs (09/08/2022)  Utilities: Not At Risk (09/08/2022)  Alcohol Screen: Low Risk  (07/12/2019)  Depression (PHQ2-9): High Risk (09/09/2022)  Financial Resource Strain: Low Risk  (09/08/2022)  Physical Activity: Unknown (09/08/2022)  Social Connections: Unknown (09/08/2022)  Stress: No Stress Concern Present (09/08/2022)  Tobacco Use: Low Risk  (10/02/2022)    Readmission Risk Interventions     No data to display

## 2022-10-09 NOTE — Progress Notes (Signed)
Progress Note   Patient: Kaitlin Hamilton [id number] DOB: 1951/01/24 DOA: 10/01/2022     7 DOS: the patient was seen and examined on 10/09/2022   Brief hospital course: Mrs. Plourde was admitted to the hospital with the working diagnosis of acute on chronic hypoxemic respiratory failure.   72 y.o. female with past medical history significant for UIP fibrosis, chronic hypoxemic respiratory failure on 9 L O2, OSA on CPAP, type 2 diabetes, hypertension, diastolic heart failure, and hypothyroidism.  As patient's condition has progressed, in the last few months, pulmonary has started discussions with patient about palliative care.  Patient had noted over the last few weeks, that even with mild exertion, she gets significantly short of breath and noted to be hypoxic. On 03/05 patient contacted Pulmonary due to rapid worsening dyspnea along with purulent sputum and fever. She had azithromycin prescribed and her rituximab dose was held.  After completing antibiotic she continue to have symptoms and Augmentin was called, that she was not able to take due to GI symptoms. The day prior admission she had levofloxacin prescribed.   On her initial physical examination her blood pressure was 114/55, HR 89, RR 26 and 02 saturation 98% on supplemental 02 per [place], lungs with rales at bases, increased work of breathing, heart with S1 and S2 present and rhythmic, abdomen with no distention and no lower extremity edema.    Na 138, K 2,4 Cl 85 bicarbonate 39, glucose 110, bun 8 cr 0,52  Wbc 10,6 hgb 9.9 plt 402   Chest radiograph with mild cardiomegaly, with bilateral hilar vascular congestion, increase interstitial infiltrates at the left lower lobe.  CT chest with bilateral ground glass opacities, interlobular and intralobular septal thickening and traction bronchiectasis, honeycombing at bases, consistent with UIP.   EKG 91 bpm, normal axis, normal qtc, atrial fibrillation rhythm with no significant ST segment or T wave  changes.   Patient with any type of exertion, oxygen saturations dropped into the 60s.    Palliative care consulted.  While patient has had some improvement, still with significant oxygen desaturation with ambulation.  Placed on scheduled p.o. Lasix.  Skilled nursing will not take patient because baseline oxygen to high (their cutoff is 6 L).  Not candidate for  LTAC or CIR if possible  Patient with rapid progressive disease, very poor prognosis, will call hospice.  Possible candidate for residential hospice.   Assessment and Plan: * ILD (interstitial lung disease) ([place]) Acute on chronic hypoxemic respiratory failure.   Patient with progressive disease and worsening respiratory failure.  Today her 02 saturation is 99% on 9 L/min per [place].  Very limited mobility due to dyspnea.  Patient will follow up with palliative care as outpatient.   She is currently on Cellcept, steroids, and Verapamil.  Discussed with pulmonary, recommendations to continue with  p.o. prednisone 60 mg and decrease by 10 mg every week.  Patient has outpatient appointment with pulmonary already scheduled on 3/29.  Patient has completed 5 days of antibiotic therapy with ceftriaxone IV.  Continue with bronchodilator therapy and inhaled steroids.  Continue with mycophenolate and pirfenidone.  Airway clearing techniques with flutter valve and incentive spirometer.   Chronic heart failure with preserved ejection fraction (HFpEF) (HCC) Echocardiogram from 2023 with preserved LV systolic function EF 60 to 65%, RV with preserved systolic function, RVSP 123XX123 mmHg. Mild dilatated LA, no significant valvular disease.   Patient had diuresis during her hospitalization with furosemide.  Contraction metabolic alkalosis.   Hypokalemia Hypomagnesemia. Hyponatremia.  Renal function has been stable.   HTN (hypertension) Continue blood pressure control with losartan and verapamil.   Dyslipidemia, continue with statin therapy.    Non-insulin dependent type 2 diabetes mellitus ([place]) Continue glucose cover and monitoring with insulin sliding scale.  Uncontrolled hyperglycemia with capillary glucose in the 300 range.   Anxiety Alprazolam increased to 4 times a day as needed  Discussed additional strategies to help with her anxiety.    Normocytic anemia Secondary to her chronic disease.  Slightly lower than previous baseline, but no indications for acute bleed  Hypothyroidism Continue synthroid  Obesity (BMI 30-39.9) Calculated BMI is 33,2 consistent with obesity class 1.  OSA.         Subjective: Patient reports being stable, continue to have dyspnea with minimal efforts, improved with rest and supplemental 02 per [place]   Physical Exam: Vitals:   10/09/22 0712 10/09/22 0853 10/09/22 1135 10/09/22 1444  BP: 118/62  (!) 125/56   Pulse: 76 81 76 74  Resp: 20 18 18 18   Temp:   98.4 F (36.9 C)   TempSrc:   Oral   SpO2: 98% 96% 100% 96%  Weight:      Height:       Neurology awake and alert ENT with mild pallor Cardiovascular with S1 and S2 present and rhythmic, positive systolic murmur at the right lower sternal border and apex. No gallops.  No JVD No lower extremity edema Respiratory with scattered rhonchi with no wheezing or rales Abdomen with no distention  Data Reviewed:    Family Communication: no family at the bedside   Disposition: Status is: Inpatient Remains inpatient appropriate because: pending outpatient hospice arrangements.   Planned Discharge Destination: Home dc home on Saturday       Author: Tawni Millers, MD 10/09/2022 3:56 PM  For on call review [email or web address].

## 2022-10-09 NOTE — Progress Notes (Signed)
  [facility] Liaison Note   Received a request from patients [place] for evaluation for [employer]. At this time patient does not meet IPU criteria. This patient is currently receiving outpatient palliative care services with [facility].   Spoke with daughter [name] this afternoon. Patient and family have decided to discharge home with [facility] Hospice services. Initiated education related to hospice philosophy, services and team approach to care. Patient and family verbalized understanding of information given. Per discussion plan is for discharge home Saturday via PTAR.  DME needs discussed. [employer] currently provides 2 O2 concentrators in the home, 1 6L and 1 10 L, currently uses 9 lpm. She also has E tanks and a large tank in case of power outage provided by [employer]. Patient is requesting BSC and a walker to be delivered to the room.  Please send completed and signed DNR with patient at discharge.  Please provide prescriptions at discharge as needed to ensure ongoing symptom management.  [facility] information and contact numbers have been given to patient, daughter and family friend Dorian Pod. Above information shared with Neoma Laming with TOC.  Please call with any hospice related questions or concerns.  Thank you,  Margaretmary Eddy, BSN, RN [facility] Liaison [phone]

## 2022-10-10 ENCOUNTER — Telehealth: Payer: Self-pay | Admitting: Pulmonary Disease

## 2022-10-10 LAB — BASIC METABOLIC PANEL
Anion gap: 6 (ref 5–15)
BUN: 14 mg/dL (ref 8–23)
CO2: 33 mmol/L — ABNORMAL HIGH (ref 22–32)
Calcium: 9.3 mg/dL (ref 8.9–10.3)
Chloride: 95 mmol/L — ABNORMAL LOW (ref 98–111)
Creatinine, Ser: 0.66 mg/dL (ref 0.44–1.00)
GFR, Estimated: >60 mL/min (ref >60)
Glucose, Bld: 199 mg/dL — ABNORMAL HIGH (ref 70–99)
Potassium: 4 mmol/L (ref 3.5–5.1)
Sodium: 134 mmol/L — ABNORMAL LOW (ref 135–145)

## 2022-10-10 LAB — GLUCOSE, CAPILLARY
Glucose-Capillary: 172 mg/dL — ABNORMAL HIGH (ref 70–99)
Glucose-Capillary: 310 mg/dL — ABNORMAL HIGH (ref 70–99)
Glucose-Capillary: 325 mg/dL — ABNORMAL HIGH (ref 70–99)
Glucose-Capillary: 292 mg/dL — ABNORMAL HIGH (ref 70–99)

## 2022-10-10 NOTE — Consult Note (Signed)
   [facility] Inpatient Consult   10/10/2022  Kaitlin Hamilton [birth year] [id number]  [place] Organization [ACO] Patient: [employer] Medicare  Primary Care Provider: [name], MD with [place] [facility]  Chart reviewed for length of stay for post hospital barriers or  needs and reveals the patient is currently transitioning to [place]. Review of inpatient TOC RNCM and Palliative notes for details.  Plan: Patient will have full case management services through Hospice and needs will be met at the hospice level of care. No [facility] Care Management is planned for transitional needs. Will sign off at transition from hospital.  For questions,   Natividad Brood, RN BSN [place]  [phone] business mobile phone Toll free office [phone]  *[place]  [phone] Fax number: [phone] [email or web address]

## 2022-10-10 NOTE — Telephone Encounter (Signed)
Yes.  I can be the attending on hospice Agree that she likely has less than 6 months to live.

## 2022-10-10 NOTE — Progress Notes (Signed)
Progress Note   Patient: Kaitlin Hamilton [id number] DOB: [birth year] DOA: 10/01/2022     8 DOS: the patient was seen and examined on 10/10/2022   Brief hospital course: Kaitlin Hamilton was admitted to the hospital with the working diagnosis of acute on chronic hypoxemic respiratory failure.   72 y.o. female with past medical history significant for UIP fibrosis, chronic hypoxemic respiratory failure on 9 L O2, OSA on CPAP, type 2 diabetes, hypertension, diastolic heart failure, and hypothyroidism.  As patient's condition has progressed, in the last few months, pulmonary has started discussions with patient about palliative care.  Patient had noted over the last few weeks, that even with mild exertion, she gets significantly short of breath and noted to be hypoxic. On 03/05 patient contacted Pulmonary due to rapid worsening dyspnea along with purulent sputum and fever. She had azithromycin prescribed and her rituximab dose was held.  After completing antibiotic she continue to have symptoms and Augmentin was called, that she was not able to take due to GI symptoms. The day prior admission she had levofloxacin prescribed.   On her initial physical examination her blood pressure was 114/55, HR 89, RR 26 and 02 saturation 98% on supplemental 02 per [name], lungs with rales at bases, increased work of breathing, heart with S1 and S2 present and rhythmic, abdomen with no distention and no lower extremity edema.    Na 138, K 2,4 Cl 85 bicarbonate 39, glucose 110, bun 8 cr 0,52  Wbc 10,6 hgb 9.9 plt 402   Chest radiograph with mild cardiomegaly, with bilateral hilar vascular congestion, increase interstitial infiltrates at the left lower lobe.  CT chest with bilateral ground glass opacities, interlobular and intralobular septal thickening and traction bronchiectasis, honeycombing at bases, consistent with UIP.   EKG 91 bpm, normal axis, normal qtc, atrial fibrillation rhythm with no significant ST segment or T wave  changes.   Patient with any type of exertion, oxygen saturations dropped into the 60s.    Palliative care consulted.  While patient has had some improvement, still with significant oxygen desaturation with ambulation.  Placed on scheduled p.o. Lasix.  Skilled nursing will not take patient because baseline oxygen to high (their cutoff is 6 L).  Not candidate for  LTAC or CIR if possible  Patient with rapid progressive disease, very poor prognosis, will call hospice.  Possible candidate for residential hospice.   03/22 patient and her family have decided for home hospice, patient will be discharged home tomorrow.   Assessment and Plan: * ILD (interstitial lung disease) ([place]) Acute on chronic hypoxemic respiratory failure.   Patient with progressive disease and worsening respiratory failure.  Today her 02 saturation is 100 % on 9 L/min per [facility].   Very limited mobility due to dyspnea.  Will continue care under home hospice.   She is currently on Cellcept, steroids, and Verapamil.  Discussed with pulmonary, recommendations to continue with  p.o. prednisone 60 mg and decrease by 10 mg every week.  Patient has outpatient appointment with pulmonary already scheduled on 3/29.  Patient has completed 5 days of antibiotic therapy with ceftriaxone IV.  Continue with bronchodilator therapy and inhaled steroids.  Continue with mycophenolate and pirfenidone.  Airway clearing techniques with flutter valve and incentive spirometer.   Chronic heart failure with preserved ejection fraction (HFpEF) (HCC) Echocardiogram from 2023 with preserved LV systolic function EF 60 to 65%, RV with preserved systolic function, RVSP 123XX123 mmHg. Mild dilatated LA, no significant valvular disease.  Patient had diuresis during her hospitalization with furosemide.  Contraction metabolic alkalosis.   Hypokalemia Hypomagnesemia. Hyponatremia.   Renal function today with serum cr at 0,66 with K at 4,0 and serum  bicarbonate at 33.  Na is 134   Plan to continue diuresis with furosemide 20 mg and will add K supplementation to avoid hypokalemia.   HTN (hypertension) Continue blood pressure control with losartan and verapamil.   Dyslipidemia, continue with statin therapy.   Non-insulin dependent type 2 diabetes mellitus ([place]) Continue glucose cover and monitoring with insulin sliding scale.  Uncontrolled hyperglycemia Fasting glucose this am is 199, capillary 325. Taper steroids as instructed.   Anxiety Alprazolam increased to 4 times a day as needed  Discussed additional strategies to help with her anxiety.    Normocytic anemia Secondary to her chronic disease.  Slightly lower than previous baseline, but no indications for acute bleed  Hypothyroidism Continue synthroid  Obesity (BMI 30-39.9) Calculated BMI is 33,2 consistent with obesity class 1.  OSA.         Subjective: Patient continue to have dyspnea on exertion, no chest pain, she has been tolerating po well.   Physical Exam: Vitals:   10/10/22 0903 10/10/22 1057 10/10/22 1439 10/10/22 1455  BP:  (!) 122/53  111/66  Pulse: 77 74 80 81  Resp: 20  20 20   Temp:  98 F (36.7 C)  98 F (36.7 C)  TempSrc:  Oral  Oral  SpO2: 98% 100% 100% 100%  Weight:      Height:       Neurology awake and alert ENT with mild pallor Cardiovascular with S1 and S2 present and rhythmic with no gallops, rubs or murmurs Respiratory with bilateral rales and scattered rhonchus with no wheezing Abdomen with no distention  No lower extremity edema  Data Reviewed:    Family Communication: no family at the bedside   Disposition: Status is: Inpatient Remains inpatient appropriate because: plan to transfer to home hospice tomorrow.   Planned Discharge Destination:  home with hospice      Author: Tawni Millers, MD 10/10/2022 4:55 PM  For on call review [email or web address].

## 2022-10-10 NOTE — Telephone Encounter (Signed)
Spoke with Kaitlin Hamilton, pt has requested Dr. Vaughan Browner be attending of record for her hospice care. If he agrees they need to know the following information  -Does this patient have 6 months or less to live  -If he does serve as attending of record will he defer comfort orders back to the Hospice Physician   Dr. Vaughan Browner can you please advise

## 2022-10-10 NOTE — Telephone Encounter (Signed)
Dr. Vaughan Browner Hospice also wants to know will be deferring comfort orders back to the Hospice Physician?

## 2022-10-10 NOTE — Telephone Encounter (Signed)
[facility] w/[facility] [phone] calling.  She has the following questions for Dr. Vaughan Browner:  Does this PT Have 6 mo or less?  Will he serve as attending of record?  If he does serve as attending of record will he defer comfort orders back to the Hospice Physician?  Pls call Judeen Hammans to advise.

## 2022-10-10 NOTE — Plan of Care (Signed)

## 2022-10-10 NOTE — Progress Notes (Addendum)
[facility])  Plan is for patient to be admitted at home with [facility] hospice services. DME to be delivered to bedside today & DC on 3.23 w/ [facility]ice services to follow.  [facility] will continue to follow for any discharge planning needs and to coordinate continuation of hospice care.     If applicable, please send signed and completed DNR with patient/family upon discharge. Please provide prescriptions at discharge as needed to ensure ongoing symptom management and a transport packet.   [facility] information and contact numbers given to family and above information shared with TOC.    Please call with any questions/concerns.    Thank you for the opportunity to participate in this patient's care   Phillis Haggis, MSW [facility] Liaison  [phone]

## 2022-10-11 LAB — GLUCOSE, CAPILLARY
Glucose-Capillary: 170 mg/dL — ABNORMAL HIGH (ref 70–99)
Glucose-Capillary: 294 mg/dL — ABNORMAL HIGH (ref 70–99)

## 2022-10-11 MED ORDER — GUAIFENESIN ER 600 MG PO TB12: 600.0000 mg | ORAL_TABLET | Freq: Every day | ORAL | 0 refills | Status: DC

## 2022-10-11 MED ORDER — IPRATROPIUM-ALBUTEROL 0.5-2.5 (3) MG/3ML IN SOLN: 3.0000 mL | Freq: Four times a day (QID) | RESPIRATORY_TRACT | 0 refills | Status: DC | PRN

## 2022-10-11 MED ORDER — PREDNISONE 10 MG PO TABS: ORAL_TABLET | ORAL | 0 refills | Status: DC

## 2022-10-11 MED ORDER — POTASSIUM CHLORIDE CRYS ER 10 MEQ PO TBCR
10.0000 meq | EXTENDED_RELEASE_TABLET | Freq: Every day | ORAL | Status: DC
  Administered 2022-10-11: 10 meq via ORAL
  Filled 2022-10-11: qty 1

## 2022-10-11 MED ORDER — MOMETASONE FURO-FORMOTEROL FUM 200-5 MCG/ACT IN AERO: 2.0000 | INHALATION_SPRAY | Freq: Two times a day (BID) | RESPIRATORY_TRACT | 0 refills | Status: DC

## 2022-10-11 MED ORDER — POTASSIUM CHLORIDE CRYS ER 10 MEQ PO TBCR: 10.0000 meq | EXTENDED_RELEASE_TABLET | Freq: Every day | ORAL | 0 refills | Status: DC

## 2022-10-11 NOTE — Discharge Summary (Addendum)
Physician Discharge Summary   Patient: Kaitlin Hamilton MRN: [id number] DOB: [birth year]  Admit date:     10/01/2022  Discharge date: 10/11/22  Discharge Physician: Jimmy Picket Yasmin Bronaugh   PCP: [name], MD   Recommendations at discharge:    Patient will continue prednisone taper, 60 mg daily and reduced by 10 mg every week.  Added bronchodilator therapy with duoneb and inhaled corticosteroids.  Follow up with hospice services at home. Follow up with Pulmonary as scheduled. Follow up with Dr [name] in 7 to 10 days.  Follow up renal function and electrolytes in 7 days as outpatient.   I spoke with patient's daughter at the bedside, we talked in detail about patient's condition, plan of care and prognosis and all questions were addressed.   Discharge Diagnoses: Principal Problem:   ILD (interstitial lung disease) ([place]) Active Problems:   Chronic heart failure with preserved ejection fraction (HFpEF) (HCC)   Hypokalemia   HTN (hypertension)   Non-insulin dependent type 2 diabetes mellitus (HCC)   Anxiety   Normocytic anemia   Hypothyroidism   Obesity (BMI 30-39.9)  Resolved Problems:   * No resolved hospital problems. [facility] Course: Kaitlin Hamilton was admitted to the hospital with the working diagnosis of acute on chronic hypoxemic respiratory failure.   72 y.o. female with past medical history significant for UIP (pulmonary fibrosis), chronic hypoxemic respiratory failure on 9 L O2, OSA on CPAP, type 2 diabetes, hypertension, diastolic heart failure, and hypothyroidism.  As patient's condition has progressed, in the last few months, pulmonary has started discussions with patient about palliative care.  Patient had noted over the last few weeks, that even with mild exertion, she gets significantly short of breath and noted to be hypoxic. On 03/05 patient contacted Pulmonary due to rapid worsening dyspnea along with purulent sputum and fever. She had azithromycin prescribed and  her rituximab dose was held.  After completing antibiotic she continue to have symptoms and Augmentin was called to her pharmacy, that she was not able to take due to GI symptoms. The day prior admission she had levofloxacin prescribed. Because persistent symptoms she came to the ED for further evaluation.   On her initial physical examination her blood pressure was 114/55, HR 89, RR 26 and 02 saturation 98% on supplemental 02 per [place], lungs with rales at bases, increased work of breathing, heart with S1 and S2 present and rhythmic, abdomen with no distention and no lower extremity edema.    Na 138, K 2,4 Cl 85 bicarbonate 39, glucose 110, bun 8 cr 0,52  Wbc 10,6 hgb 9.9 plt 402   Chest radiograph with mild cardiomegaly, with bilateral hilar vascular congestion, increase interstitial infiltrates at the left lower lobe.  CT chest with bilateral ground glass opacities, interlobular and intralobular septal thickening and traction bronchiectasis, honeycombing at bases, consistent with UIP.   EKG 91 bpm, normal axis, normal qtc, atrial fibrillation rhythm with no significant ST segment or T wave changes.   Patient was placed on supplemental 02 per [facility]NC, received diuretics, bronchodilators and antibiotics.   At rest her oxygen saturations dropped into the 60s.    Palliative care consulted.  While patient has had some improvement, still with significant oxygen desaturation with ambulation. Skilled nursing will not take patient because baseline oxygen to high (their cutoff is 6 L).  Not candidate for  LTAC or CIR.  Patient with rapid progressive disease, very poor prognosis, hospice was contacted.   03/22 patient and her  family have decided for home hospice, patient will be discharged home tomorrow.  03/22 plan to discharge home today and follow up with hospice services.   Assessment and Plan: * ILD (interstitial lung disease) ([place]) Acute on chronic hypoxemic respiratory failure.   Patient with  progressive disease and worsening respiratory failure.   Very limited mobility due to dyspnea.   She is currently on Cellcept, steroids, and Verapamil.  Discussed with pulmonary, recommendations to continue with  p.o. prednisone 60 mg and decrease by 10 mg every week.  Patient has outpatient appointment with pulmonary already scheduled on 3/29.  Patient has completed 5 days of antibiotic therapy with ceftriaxone IV.  Continue with bronchodilator therapy and inhaled steroids.  Continue with mycophenolate and pirfenidone.  Airway clearing techniques with flutter valve and incentive spirometer.   Chronic heart failure with preserved ejection fraction (HFpEF) (HCC) Acute on chronic diastolic heart failure.  Echocardiogram from 2023 with preserved LV systolic function EF 60 to 65%, RV with preserved systolic function, RVSP 123XX123 mmHg. Mild dilatated LA, no significant valvular disease.   Patient had diuresis during her hospitalization with furosemide.  Contraction metabolic alkalosis.  Plan to continue diuresis with furosemide 20 mg po daily to keep negative fluid balance.   Hypokalemia Hypomagnesemia. Hyponatremia.   Her discharge renal function with serum cr at 0,66 with K at 4,0 and serum bicarbonate at 33.  Na is 134   Plan to continue diuresis with furosemide 20 mg plus K supplementation to avoid hypokalemia.   HTN (hypertension) Continue blood pressure control with losartan and verapamil.   Dyslipidemia, continue with statin therapy.   Non-insulin dependent type 2 diabetes mellitus (HCC) Uncontrolled hyperglycemia, steroid induced hyperglycemia.  Patient was placed on insulin therapy during her hospitalization, for glucose cover and monitoring.  At her discharge her capillary glucose is 170 mg/dl.   Plan to continue taper steroids.   Anxiety Continue with as needed alprazolam for anxiety.   Normocytic anemia Anemia of chronic disease, cell count has been stable. Her  discharge hgb is 9.9  Hypothyroidism Continue synthroid  Obesity (BMI 30-39.9) Calculated BMI is 33,2 consistent with obesity class 1.  OSA.          Consultants: pulmonary  Procedures performed: none   Disposition: Home Diet recommendation:  Discharge Diet Orders (From admission, onward)     Start     Ordered   10/11/22 0000  Diet - low sodium heart healthy        10/11/22 0856           Regular diet DISCHARGE MEDICATION: Allergies as of 10/11/2022       Reactions   Adhesive [tape]    Redness, bruising with any adhesives- bandaids, patches, etc.    Atovaquone Itching   Codeine Hives, Swelling   Demerol [meperidine] Hives, Swelling   Hydrocodone Nausea Only   Augmentin [amoxicillin-pot Clavulanate] Other (See Comments)   Upsets stomach   Azithromycin Nausea And Vomiting   Upset stomach   Trazodone And Nefazodone Itching   Sulfa Antibiotics Rash        Medication List     STOP taking these medications    hydrochlorothiazide 12.5 MG tablet Commonly known as: HYDRODIURIL   nystatin powder Commonly known as: MYCOSTATIN/NYSTOP       TAKE these medications    acetaminophen 500 MG tablet Commonly known as: TYLENOL Take 1,000 mg by mouth every 6 (six) hours as needed for moderate pain.   ALPRAZolam 0.25 MG tablet Commonly  known as: XANAX Take 1 tablet (0.25 mg total) by mouth 2 (two) times daily as needed for anxiety.   baclofen 10 MG tablet Commonly known as: LIORESAL Take 1 tablet (10 mg total) by mouth 3 (three) times daily as needed. What changed:  when to take this reasons to take this   fluticasone 50 MCG/ACT nasal spray Commonly known as: FLONASE Place 1 spray into both nostrils daily as needed for rhinitis.   furosemide 20 MG tablet Commonly known as: LASIX Take 1 tablet (20 mg total) by mouth daily. Take one by mouth daily. What changed: additional instructions   glipiZIDE 10 MG 24 hr tablet Commonly known as: GLUCOTROL  XL Take 1 tablet (10 mg total) by mouth daily with breakfast.   guaiFENesin 600 MG 12 hr tablet Commonly known as: MUCINEX Take 1 tablet (600 mg total) by mouth daily.   ipratropium-albuterol 0.5-2.5 (3) MG/3ML Soln Commonly known as: DUONEB Take 3 mLs by nebulization every 6 (six) hours as needed.   ketoconazole 2 % cream Commonly known as: NIZORAL Apply 1 Application topically daily. What changed:  when to take this reasons to take this   levothyroxine 125 MCG tablet Commonly known as: SYNTHROID TAKE 1 TABLET BY MOUTH DAILY  BEFORE BREAKFAST   losartan 50 MG tablet Commonly known as: COZAAR TAKE 1 TABLET DAILY ALONG WITH 1 OF THE HCTZ 12.5MG  TABLETS   metFORMIN 500 MG 24 hr tablet Commonly known as: GLUCOPHAGE-XR Take 4 tablets (2,000 mg total) by mouth daily with breakfast. What changed:  how much to take when to take this   mometasone-formoterol 200-5 MCG/ACT Aero Commonly known as: DULERA Inhale 2 puffs into the lungs 2 (two) times daily.   montelukast 10 MG tablet Commonly known as: SINGULAIR Take 1 tablet (10 mg total) by mouth at bedtime.   mycophenolate 250 MG capsule Commonly known as: CellCept Take 1500mg  twice daily   NON FORMULARY Place 6-8 L into the nose daily. 10 Liters with exertion   ondansetron 4 MG tablet Commonly known as: Zofran Take 1 tablet (4 mg total) by mouth every 8 (eight) hours as needed for nausea or vomiting.   OneTouch Delica Lancets [id number] Misc CHECK BLOOD SUGAR ONCE DAILY.   OneTouch Ultra test strip Generic drug: glucose blood CHECK BLOOD SUGAR DAILY.   pantoprazole 40 MG tablet Commonly known as: PROTONIX TAKE 1 TABLET BY MOUTH TWICE  DAILY   Pirfenidone 801 MG Tabs TAKE 1 TABLET (801MG ) BY MOUTH  THREE TIMES DAILY WITH FOOD What changed: See the new instructions.   potassium chloride 10 MEQ tablet Commonly known as: KLOR-CON M Take 1 tablet (10 mEq total) by mouth daily.   predniSONE 10 MG tablet Commonly known  as: DELTASONE Take 6 tablets for four days, then take 5 tablets for seven days, then take 4 tablet for seven days, then take 3  tablet for seven days, then take 2 tablets for seven days, then take 1 tablet for seven days. Start taking on: October 12, 2022 What changed:  medication strength how much to take how to take this when to take this additional instructions   PROBIOTIC DAILY PO Take 1 capsule by mouth daily.   rosuvastatin 10 MG tablet Commonly known as: Crestor Take 1 tablet (10 mg total) by mouth daily.   verapamil 180 MG CR tablet Commonly known as: CALAN-SR TAKE 1 TABLET BY MOUTH DAILY   VITAMIN C PO Take 1,000 mg by mouth daily.  Durable Medical Equipment  (From admission, onward)           Start     Ordered   10/11/22 0000  For home use only DME Nebulizer machine       Question Answer Comment  Patient needs a nebulizer to treat with the following condition COPD (chronic obstructive pulmonary disease) ([place])   Length of Need 12 Months      10/11/22 0856            Follow-up Information     Connect with your PCP/Specialist as discussed. Schedule an appointment as soon as possible for a visit .   Contact information: [email or web address] Call our physician referral line at [phone].        [facility] Follow up.   Specialty: Home Health Services Why: Agency will contact you to set up apt times Contact information: [place] [address] [phone]         [facility] Follow up.   Why: outpatient palliative services Contact information: [place] [id number] [phone]               Discharge Exam: Danley Danker Weights   10/08/22 0048 10/09/22 0617 10/11/22 0128  Weight: 93.4 kg 92.4 kg 93 kg   BP (!) 116/47 (BP Location: Left Arm)   Pulse 67   Temp 97.6 F (36.4 C) (Oral)   Resp 20   Ht 5\' 6"  (1.676 m)    Wt 93 kg   SpO2 100%   BMI 33.09 kg/m   Patient is feeling stable, her dyspnea is controlled. No chest pain, no nausea or vomiting.   Neurology awake and alert ENT with mild pallor Cardiovascular with S1 and S2 present, with no gallops, rubs or murmurs Respiratory with mild rhonchi and rales with no wheezing Abdomen with no distention  No lower extremity edema   Condition at discharge: stable  The results of significant diagnostics from this hospitalization (including imaging, microbiology, ancillary and laboratory) are listed below for reference.   Imaging Studies: CT Chest High Resolution  Result Date: 10/02/2022 CLINICAL DATA:  Cough, interstitial lung disease EXAM: CT CHEST WITHOUT CONTRAST TECHNIQUE: Multidetector CT imaging of the chest was performed following the standard protocol without intravenous contrast. High resolution imaging of the lungs, as well as inspiratory and expiratory imaging, was performed. RADIATION DOSE REDUCTION: This exam was performed according to the departmental dose-optimization program which includes automated exposure control, adjustment of the mA and/or kV according to patient size and/or use of iterative reconstruction technique. COMPARISON:  02/07/2022, 03/06/2021 FINDINGS: Cardiovascular: No significant vascular findings. Cardiomegaly. No pericardial effusion. Mediastinum/Nodes: No enlarged mediastinal, hilar, or axillary lymph nodes. Small hiatal hernia. Thyroid gland, trachea, and esophagus demonstrate no significant findings. Lungs/Pleura: Examination limited by breath motion artifact, and all series provided are performed at essentially complete expiration. Within this limitation, no significant change in mild to moderate pulmonary fibrosis in a pattern with apical to basal gradient featuring irregular peripheral interstitial opacity, septal thickening, traction bronchiectasis, subpleural bronchiolectasis, and small areas of honeycombing at the lung  bases. Evidence of prior right lung wedge biopsy. No acute appearing airspace opacity. Lobular air trapping. No pleural effusion or pneumothorax. Upper Abdomen: No acute abnormality. Musculoskeletal: No chest wall abnormality. No acute osseous findings. IMPRESSION: 1. Examination limited by breath motion artifact, and all series provided are performed at essentially complete expiration. Within this limitation, no significant change in mild to moderate pulmonary fibrosis in  a pattern with apical to basal gradient featuring irregular peripheral interstitial opacity, septal thickening, traction bronchiectasis, subpleural bronchiolectasis, and small areas of honeycombing at the lung bases. Findings remain consistent with UIP per consensus guidelines: Diagnosis of Idiopathic Pulmonary Fibrosis: An Official ATS/ERS/JRS/ALAT Clinical Practice Guideline. [place], Iss 5, [phone], Mar 21 2017. 2. Lobular air trapping, consistent with small airways disease. 3. Cardiomegaly. 4. Small hiatal hernia. Electronically Signed   By: Delanna Ahmadi M.D.   On: 10/02/2022 08:31   DG Chest Portable 1 View  Result Date: 10/01/2022 CLINICAL DATA:  Shortness of breath EXAM: PORTABLE CHEST 1 VIEW COMPARISON:  05/29/2022, CT chest 02/07/2022 FINDINGS: Bilateral reticular and mild ground-glass opacity consistent with chronic lung disease. No definite acute superimposed airspace disease or effusion. Stable cardiomediastinal silhouette. No pneumothorax. Postsurgical changes in the right upper lung. IMPRESSION: Chronic interstitial lung disease. No definite acute superimposed process. Electronically Signed   By: Donavan Foil M.D.   On: 10/01/2022 18:46    Microbiology: Results for orders placed or performed during the hospital encounter of 10/01/22  Blood culture (routine x 2)     Status: None   Collection Time: 10/01/22  5:59 PM   Specimen: BLOOD RIGHT HAND  Result Value Ref Range Status   Specimen  Description BLOOD RIGHT HAND  Final   Special Requests   Final    BOTTLES DRAWN AEROBIC AND ANAEROBIC Blood Culture adequate volume   Culture   Final    NO GROWTH 5 DAYS Performed at [facility], [place] [address] [place], [place] [address]    Report Status 10/06/2022 FINAL  Final  Blood culture (routine x 2)     Status: None   Collection Time: 10/01/22  6:17 PM   Specimen: BLOOD  Result Value Ref Range Status   Specimen Description BLOOD SITE NOT SPECIFIED  Final   Special Requests   Final    BOTTLES DRAWN AEROBIC ONLY Blood Culture results may not be optimal due to an inadequate volume of blood received in culture bottles   Culture   Final    NO GROWTH 5 DAYS Performed at [facility], [place] [address] [place], [place] [address]    Report Status 10/06/2022 FINAL  Final  Expectorated Sputum Assessment w Gram Stain, Rflx to Resp Cult     Status: None   Collection Time: 10/02/22 12:51 AM   Specimen: Expectorated Sputum  Result Value Ref Range Status   Specimen Description EXPECTORATED SPUTUM  Final   Special Requests NONE  Final   Sputum evaluation   Final    THIS SPECIMEN IS ACCEPTABLE FOR SPUTUM CULTURE Performed at [facility], [place] [address] [place], [place] [address]    Report Status 10/07/2022 FINAL  Final  Culture, Respiratory w Gram Stain     Status: None   Collection Time: 10/02/22 12:51 AM  Result Value Ref Range Status   Specimen Description EXPECTORATED SPUTUM  Final   Special Requests NONE Reflexed from [id number]  Final   Gram Stain   Final    FEW SQUAMOUS EPITHELIAL CELLS PRESENT FEW WBC PRESENT,BOTH PMN AND MONONUCLEAR FEW GRAM POSITIVE RODS FEW GRAM NEGATIVE RODS FEW GRAM POSITIVE COCCI IN PAIRS    Culture   Final    RARE Normal respiratory flora-no Staph aureus or Pseudomonas seen Performed at [facility], [place] [address], [place], [place] [address]    Report Status 10/04/2022 FINAL  Final  Labs: CBC: No results for input(s):  "WBC", "NEUTROABS", "HGB", "HCT", "MCV", "PLT" in the last 168 hours. Basic Metabolic Panel: Recent Labs  Lab 10/05/22 0101 10/06/22 0048 10/10/22 0104  NA 138 133* 134*  K 4.2 4.0 4.0  CL 95* 92* 95*  CO2 33* 33* 33*  GLUCOSE 224* 173* 199*  BUN 15 18 14   CREATININE 0.54 0.72 0.66  CALCIUM 9.4 9.2 9.3   Liver Function Tests: No results for input(s): "AST", "ALT", "ALKPHOS", "BILITOT", "PROT", "ALBUMIN" in the last 168 hours. CBG: Recent Labs  Lab 10/10/22 0603 10/10/22 1108 10/10/22 1604 10/10/22 2104 10/11/22 0622  GLUCAP 172* 310* 325* 292* 170*    Discharge time spent: greater than 30 minutes.  Signed: Tawni Millers, MD [facility] 10/11/2022

## 2022-10-11 NOTE — Plan of Care (Signed)

## 2022-10-11 NOTE — Progress Notes (Signed)
[facility])   Plan is for patient to be admitted at home with [facility] hospice services. HCPOA/Ellen confirmed that DME was delivered at bedside on 3.22.   Patient will discharge today via PTAR today.   If applicable, please send signed and completed DNR with patient/family upon discharge. Please provide prescriptions at discharge as needed to ensure ongoing symptom management and a transport packet.   [facility] information and contact numbers given to family and above information shared with TOC.    Please call with any questions/concerns.    Thank you for the opportunity to participate in this patient's care   Phillis Haggis, MSW [facility] Liaison  [phone]

## 2022-10-11 NOTE — TOC Transition Note (Signed)
Transition of Care [facility]) - CM/SW Discharge Note   Patient Details  Name: Kaitlin Hamilton MRN: [id number] Date of Birth: 22-Jul-1950  Transition of Care [facility]) CM/SW Contact:  Carles Collet, RN Phone Number: 10/11/2022, 10:14 AM   Clinical Narrative:     [name] with [facility], she confirms that the family has received DME RW and BSC and taken it home.  Patient has oxygen (9L baseline at home) discussed with [facility], confirmed O2 needs covered.  Spoke with her daughter Kaitlin Hamilton to confirm time for transport. Patient will be added to [facility] list for 12noon. [name] confirms that there is another caregiver at the house to receive her.      Final next level of care: Home w Hospice Care Barriers to Discharge: No Barriers Identified   Patient Goals and CMS Choice CMS Medicare.gov Compare Post Acute Care list provided to:: Patient Represenative (must comment) Choice offered to / list presented to : Adult Children  Discharge Placement                         Discharge Plan and Services Additional resources added to the After Visit Summary for   In-house Referral: NA Discharge Planning Services: CM Consult Post Acute Care Choice: Hospice          DME Arranged:  ([facility] will supply DME) DME Agency: NA       HH Arranged: RN [place] Agency: [facility] Teacher, early years/pre) Date [place] Contacted: 10/09/22 Time [name]: [place]    Social Determinants of Health (SDOH) Interventions SDOH Screenings   Food Insecurity: No Food Insecurity (10/11/2022)  Housing: Low Risk  (10/11/2022)  Transportation Needs: No Transportation Needs (10/11/2022)  Utilities: Not At Risk (10/11/2022)  Alcohol Screen: Low Risk  (07/12/2019)  Depression (PHQ2-9): High Risk (09/09/2022)  Financial Resource Strain: Low Risk  (09/08/2022)  Physical Activity: Unknown (09/08/2022)  Social Connections: Unknown (09/08/2022)  Stress: No Stress Concern Present (09/08/2022)  Tobacco Use: Low Risk   (10/02/2022)     Readmission Risk Interventions     No data to display

## 2022-10-13 ENCOUNTER — Other Ambulatory Visit: Payer: Self-pay | Admitting: Pulmonary Disease

## 2022-10-15 NOTE — Telephone Encounter (Signed)
Yes ... Please

## 2022-10-15 NOTE — Telephone Encounter (Signed)
Called and spoke with Judeen Hammans. She verbalized understanding.   Nothing further needed at time of call.

## 2022-10-16 ENCOUNTER — Other Ambulatory Visit: Payer: Self-pay | Admitting: Family Medicine

## 2022-10-16 DIAGNOSIS — E1169 Type 2 diabetes mellitus with other specified complication: Secondary | ICD-10-CM

## 2022-10-17 ENCOUNTER — Inpatient Hospital Stay: Payer: Medicare Other | Admitting: Primary Care

## 2022-10-20 ENCOUNTER — Encounter: Payer: Self-pay | Admitting: Family Medicine

## 2022-10-20 ENCOUNTER — Telehealth: Payer: Medicare Other | Admitting: Primary Care

## 2022-10-20 ENCOUNTER — Encounter: Payer: Self-pay | Admitting: Primary Care

## 2022-10-20 ENCOUNTER — Other Ambulatory Visit: Payer: Self-pay | Admitting: Pulmonary Disease

## 2022-10-20 DIAGNOSIS — J849 Interstitial pulmonary disease, unspecified: Secondary | ICD-10-CM

## 2022-10-20 DIAGNOSIS — J9611 Chronic respiratory failure with hypoxia: Secondary | ICD-10-CM

## 2022-10-20 DIAGNOSIS — G4733 Obstructive sleep apnea (adult) (pediatric): Secondary | ICD-10-CM | POA: Diagnosis not present

## 2022-10-20 NOTE — Progress Notes (Signed)
Virtual Visit via Video Note  I connected with Kaitlin Hamilton on 10/20/22 at  9:30 AM EDT by a video enabled telemedicine application and verified that I am speaking with the correct person using two identifiers.  Location: Patient: Home Provider: Office    I discussed the limitations of evaluation and management by telemedicine and the availability of in person appointments. The patient expressed understanding and agreed to proceed.  History of Present Illness: 72 year old female, never smoked.  Past medical history significant for ILD, usual interstitial pneumonitis, obstructive sleep apnea, chronic respiratory failure with hypoxia, allergic rhinitis, GERD, esophageal dysmotility, chronic heart failure, hypertension, type 2 diabetes, hypothyroidism, oppressive disorder/anxiety, insomnia.  Patient of Dr. Isaiah Serge, last seen on 08/29/2022.    10/20/2022 Patient contacted today for hospital follow-up. She is a patient of Dr. Isaiah Serge, last seen in February 2024. Healthy progressive since 2016.  Overall decline clinically, pulmonary function testing with worsening restriction and diffusion impairment.  No pulmonary hypertension on echocardiogram. She was maintained on CellCept, Esbriet and prednisone 5mg  for ILD.  Recommended at her last visit she be started on Rituxan. On CPAP for CPAP- taking xanax and Ambien for anxiety/insomnia.   Admitted 10/01/22-09/12/22 for CAP  Patient with progressive disease and worsening respiratory failure Completed 5 days of antibiotic therapy with IV ceftriaxone Pulmonary recommended continuing prednisone 60 mg daily and decrease by 10 mg every week.  Patient transitioned from palliative to hospice, Dr. Isaiah Serge will be attending. She is still taking prednisone, currently on 50mg  and will taper to 40mg  on Thursday. Stopped Esbriet and Cellcept. Dulera and Singulair is not being covered. Breathing is ok. Using Ipratropium-Albuterol but causing jitteriness. She is taking over  the counter Zyrtec with good relief. She is no longer taking metformin d/t GI symptoms. She is taking Glipizide and glucose readings have alright. She was discharged on lasix and potassium. She is having a lot of spasms in her legs. No leg swelling.  She is using 9-11L supplemental oxygen. She is not sleeping well, she is getting more confused. She takes xanax 0.25mg  q 4 hours PRN. Hospice RN coming today   Observations/Objective:  Alert and oriented. With her daughter. Patient is wearing oxygen, no overt respiratory distress.   Assessment and Plan:  CAP - Completed 5 days of antibiotic therapy with IV ceftriaxone. Pulmonary recommended continuing prednisone 60 mg daily and decrease by 10 mg every week.  - Change prednisone taper to decrease by 10mg  every 5 days  ILD   - Stopped Esbriet and Cellcept  - Continue to follow with Hospice   COPD  - Elwin Sleight is not being  covered  - Change ipratropium-Albuterol to as needed q 4 hours   Seasonal allergies - Singulair is not being covered  - Continue Zyretc 10mg  daily for allergy symptoms   Chronic respiratory failure: - Continue 9-11L supplemental oxygen  OSA: - Continue CPAP nightly   Follow Up Instructions:  3 months with Dr. Isaiah Serge   I discussed the assessment and treatment plan with the patient. The patient was provided an opportunity to ask questions and all were answered. The patient agreed with the plan and demonstrated an understanding of the instructions.   The patient was advised to call back or seek an in-person evaluation if the symptoms worsen or if the condition fails to improve as anticipated.  I provided 40 minutes of non-face-to-face time during this encounter.   Glenford Bayley, NP

## 2022-10-21 ENCOUNTER — Telehealth: Payer: Self-pay | Admitting: Primary Care

## 2022-10-21 NOTE — Telephone Encounter (Signed)
Hospice nurse/ case mgr called in stating she has questions for nurse Volanda Napoleon. States Volanda Napoleon informed daughter of further instruction but she isn't able to take orders from daughter, it has to be from nurse herself.

## 2022-10-22 NOTE — Telephone Encounter (Signed)
Medication list updated.

## 2022-10-22 NOTE — Telephone Encounter (Signed)
Atc nurse back x2. Could not leave VMM.

## 2022-10-27 ENCOUNTER — Other Ambulatory Visit: Payer: Self-pay | Admitting: Pulmonary Disease

## 2022-10-27 NOTE — Telephone Encounter (Signed)
Received a call from pt's daughter about pt's xanax prescription.  She said that pt has been admitted to hospice care and states that they do not have an adequate prescription for pt's xanax.  States that they are needing to have Rx for 0.5mg  sent to the pharmacy for pt as an updated prescription was never sent to the pharmacy for pt.  Pt recently had a virtual visit and from this visit, a new order for the 0.5mg  was never placed and after pt was admitted in hospice care, a new Rx for 0.5 was also never sent.   Pharmacy that this med dose change needs to be sent to is [place].  Dr. Isaiah Serge, please advise.

## 2022-11-19 DEATH — deceased

## 2023-03-10 ENCOUNTER — Ambulatory Visit: Payer: Medicare Other | Admitting: Family Medicine
